# Patient Record
Sex: Female | Born: 1940 | Race: White | Hispanic: No | Marital: Married | Smoking: Former smoker
Health system: Southern US, Community
[De-identification: ages and names within clinical notes are randomized; demographics above are authoritative.]

## PROBLEM LIST (undated history)

## (undated) DIAGNOSIS — G709 Myoneural disorder, unspecified: Secondary | ICD-10-CM

## (undated) DIAGNOSIS — K279 Peptic ulcer, site unspecified, unspecified as acute or chronic, without hemorrhage or perforation: Secondary | ICD-10-CM

## (undated) DIAGNOSIS — F329 Major depressive disorder, single episode, unspecified: Secondary | ICD-10-CM

## (undated) DIAGNOSIS — Z95810 Presence of automatic (implantable) cardiac defibrillator: Secondary | ICD-10-CM

## (undated) DIAGNOSIS — J449 Chronic obstructive pulmonary disease, unspecified: Secondary | ICD-10-CM

## (undated) DIAGNOSIS — F419 Anxiety disorder, unspecified: Secondary | ICD-10-CM

## (undated) DIAGNOSIS — R011 Cardiac murmur, unspecified: Secondary | ICD-10-CM

## (undated) DIAGNOSIS — F32A Depression, unspecified: Secondary | ICD-10-CM

## (undated) DIAGNOSIS — D649 Anemia, unspecified: Secondary | ICD-10-CM

## (undated) DIAGNOSIS — K219 Gastro-esophageal reflux disease without esophagitis: Secondary | ICD-10-CM

## (undated) DIAGNOSIS — I10 Essential (primary) hypertension: Secondary | ICD-10-CM

## (undated) DIAGNOSIS — I509 Heart failure, unspecified: Secondary | ICD-10-CM

## (undated) DIAGNOSIS — K5790 Diverticulosis of intestine, part unspecified, without perforation or abscess without bleeding: Secondary | ICD-10-CM

## (undated) DIAGNOSIS — J45909 Unspecified asthma, uncomplicated: Secondary | ICD-10-CM

## (undated) DIAGNOSIS — T7840XA Allergy, unspecified, initial encounter: Secondary | ICD-10-CM

## (undated) DIAGNOSIS — M719 Bursopathy, unspecified: Secondary | ICD-10-CM

## (undated) DIAGNOSIS — Z5189 Encounter for other specified aftercare: Secondary | ICD-10-CM

## (undated) DIAGNOSIS — N189 Chronic kidney disease, unspecified: Secondary | ICD-10-CM

## (undated) DIAGNOSIS — K589 Irritable bowel syndrome without diarrhea: Secondary | ICD-10-CM

## (undated) DIAGNOSIS — M1990 Unspecified osteoarthritis, unspecified site: Secondary | ICD-10-CM

## (undated) DIAGNOSIS — H269 Unspecified cataract: Secondary | ICD-10-CM

## (undated) DIAGNOSIS — E785 Hyperlipidemia, unspecified: Secondary | ICD-10-CM

## (undated) DIAGNOSIS — Z952 Presence of prosthetic heart valve: Secondary | ICD-10-CM

## (undated) DIAGNOSIS — K649 Unspecified hemorrhoids: Secondary | ICD-10-CM

## (undated) DIAGNOSIS — M109 Gout, unspecified: Secondary | ICD-10-CM

## (undated) DIAGNOSIS — Z950 Presence of cardiac pacemaker: Secondary | ICD-10-CM

## (undated) DIAGNOSIS — M797 Fibromyalgia: Secondary | ICD-10-CM

## (undated) DIAGNOSIS — T8859XA Other complications of anesthesia, initial encounter: Secondary | ICD-10-CM

## (undated) DIAGNOSIS — K635 Polyp of colon: Secondary | ICD-10-CM

## (undated) DIAGNOSIS — I499 Cardiac arrhythmia, unspecified: Secondary | ICD-10-CM

## (undated) DIAGNOSIS — T4145XA Adverse effect of unspecified anesthetic, initial encounter: Secondary | ICD-10-CM

## (undated) DIAGNOSIS — I341 Nonrheumatic mitral (valve) prolapse: Secondary | ICD-10-CM

## (undated) DIAGNOSIS — E039 Hypothyroidism, unspecified: Secondary | ICD-10-CM

## (undated) HISTORY — DX: Presence of prosthetic heart valve: Z95.2

## (undated) HISTORY — DX: Allergy, unspecified, initial encounter: T78.40XA

## (undated) HISTORY — DX: Hyperlipidemia, unspecified: E78.5

## (undated) HISTORY — DX: Chronic kidney disease, unspecified: N18.9

## (undated) HISTORY — DX: Irritable bowel syndrome, unspecified: K58.9

## (undated) HISTORY — DX: Fibromyalgia: M79.7

## (undated) HISTORY — PX: KNEE ARTHROSCOPY: SUR90

## (undated) HISTORY — DX: Nonrheumatic mitral (valve) prolapse: I34.1

## (undated) HISTORY — DX: Heart failure, unspecified: I50.9

## (undated) HISTORY — DX: Presence of automatic (implantable) cardiac defibrillator: Z95.810

## (undated) HISTORY — PX: EYE SURGERY: SHX253

## (undated) HISTORY — PX: CARDIAC DEFIBRILLATOR PLACEMENT: SHX171

## (undated) HISTORY — DX: Chronic obstructive pulmonary disease, unspecified: J44.9

## (undated) HISTORY — DX: Unspecified hemorrhoids: K64.9

## (undated) HISTORY — DX: Polyp of colon: K63.5

## (undated) HISTORY — DX: Cardiac murmur, unspecified: R01.1

## (undated) HISTORY — DX: Unspecified asthma, uncomplicated: J45.909

## (undated) HISTORY — DX: Anemia, unspecified: D64.9

## (undated) HISTORY — DX: Peptic ulcer, site unspecified, unspecified as acute or chronic, without hemorrhage or perforation: K27.9

## (undated) HISTORY — DX: Unspecified cataract: H26.9

## (undated) HISTORY — DX: Myoneural disorder, unspecified: G70.9

## (undated) HISTORY — DX: Diverticulosis of intestine, part unspecified, without perforation or abscess without bleeding: K57.90

## (undated) HISTORY — PX: COLONOSCOPY: SHX174

## (undated) HISTORY — DX: Unspecified osteoarthritis, unspecified site: M19.90

## (undated) HISTORY — DX: Depression, unspecified: F32.A

## (undated) HISTORY — DX: Gastro-esophageal reflux disease without esophagitis: K21.9

## (undated) HISTORY — DX: Encounter for other specified aftercare: Z51.89

## (undated) HISTORY — DX: Essential (primary) hypertension: I10

## (undated) HISTORY — DX: Bursopathy, unspecified: M71.9

## (undated) HISTORY — DX: Major depressive disorder, single episode, unspecified: F32.9

## (undated) HISTORY — PX: ABDOMINAL HYSTERECTOMY: SHX81

## (undated) HISTORY — PX: INSERT / REPLACE / REMOVE PACEMAKER: SUR710

## (undated) HISTORY — DX: Anxiety disorder, unspecified: F41.9

---

## 1999-08-19 ENCOUNTER — Encounter: Admission: RE | Admit: 1999-08-19 | Discharge: 1999-08-19 | Payer: Self-pay | Admitting: Obstetrics and Gynecology

## 1999-08-19 ENCOUNTER — Encounter: Payer: Self-pay | Admitting: Obstetrics and Gynecology

## 1999-08-28 ENCOUNTER — Encounter: Admission: RE | Admit: 1999-08-28 | Discharge: 1999-08-28 | Payer: Self-pay | Admitting: Obstetrics and Gynecology

## 1999-08-28 ENCOUNTER — Encounter: Payer: Self-pay | Admitting: Obstetrics and Gynecology

## 2000-06-02 HISTORY — PX: CARDIAC VALVE REPLACEMENT: SHX585

## 2000-07-22 ENCOUNTER — Other Ambulatory Visit: Admission: RE | Admit: 2000-07-22 | Discharge: 2000-07-22 | Payer: Self-pay | Admitting: Internal Medicine

## 2000-07-22 ENCOUNTER — Encounter: Payer: Self-pay | Admitting: Internal Medicine

## 2000-07-22 ENCOUNTER — Encounter (INDEPENDENT_AMBULATORY_CARE_PROVIDER_SITE_OTHER): Payer: Self-pay | Admitting: Specialist

## 2000-08-19 ENCOUNTER — Encounter: Payer: Self-pay | Admitting: *Deleted

## 2000-08-19 ENCOUNTER — Inpatient Hospital Stay (HOSPITAL_COMMUNITY): Admission: EM | Admit: 2000-08-19 | Discharge: 2000-08-20 | Payer: Self-pay | Admitting: Emergency Medicine

## 2000-09-01 ENCOUNTER — Encounter: Payer: Self-pay | Admitting: Obstetrics and Gynecology

## 2000-09-01 ENCOUNTER — Encounter: Admission: RE | Admit: 2000-09-01 | Discharge: 2000-09-01 | Payer: Self-pay | Admitting: Obstetrics and Gynecology

## 2001-01-19 ENCOUNTER — Inpatient Hospital Stay (HOSPITAL_COMMUNITY): Admission: EM | Admit: 2001-01-19 | Discharge: 2001-01-22 | Payer: Self-pay | Admitting: Emergency Medicine

## 2001-01-19 ENCOUNTER — Encounter: Payer: Self-pay | Admitting: Pulmonary Disease

## 2001-01-19 ENCOUNTER — Ambulatory Visit (HOSPITAL_COMMUNITY): Admission: RE | Admit: 2001-01-19 | Discharge: 2001-01-19 | Payer: Self-pay | Admitting: Pulmonary Disease

## 2001-01-21 ENCOUNTER — Encounter: Payer: Self-pay | Admitting: Gastroenterology

## 2001-01-29 ENCOUNTER — Encounter: Payer: Self-pay | Admitting: Pulmonary Disease

## 2001-01-29 ENCOUNTER — Inpatient Hospital Stay (HOSPITAL_COMMUNITY): Admission: EM | Admit: 2001-01-29 | Discharge: 2001-02-04 | Payer: Self-pay | Admitting: *Deleted

## 2001-01-30 ENCOUNTER — Encounter: Payer: Self-pay | Admitting: Internal Medicine

## 2001-02-01 ENCOUNTER — Encounter: Payer: Self-pay | Admitting: Pulmonary Disease

## 2001-02-03 ENCOUNTER — Encounter: Payer: Self-pay | Admitting: Internal Medicine

## 2001-02-17 ENCOUNTER — Encounter: Payer: Self-pay | Admitting: Internal Medicine

## 2001-04-05 ENCOUNTER — Encounter: Payer: Self-pay | Admitting: Internal Medicine

## 2001-09-02 ENCOUNTER — Encounter: Admission: RE | Admit: 2001-09-02 | Discharge: 2001-09-02 | Payer: Self-pay | Admitting: Obstetrics and Gynecology

## 2001-09-02 ENCOUNTER — Encounter: Payer: Self-pay | Admitting: Obstetrics and Gynecology

## 2002-02-28 ENCOUNTER — Encounter: Payer: Self-pay | Admitting: Internal Medicine

## 2002-03-02 ENCOUNTER — Encounter: Payer: Self-pay | Admitting: Internal Medicine

## 2002-09-06 ENCOUNTER — Encounter: Admission: RE | Admit: 2002-09-06 | Discharge: 2002-09-06 | Payer: Self-pay | Admitting: Obstetrics and Gynecology

## 2002-09-06 ENCOUNTER — Encounter: Payer: Self-pay | Admitting: Obstetrics and Gynecology

## 2003-06-03 HISTORY — PX: NM MYOCAR PERF WALL MOTION: HXRAD629

## 2003-06-12 ENCOUNTER — Encounter: Payer: Self-pay | Admitting: Internal Medicine

## 2003-06-13 ENCOUNTER — Encounter: Payer: Self-pay | Admitting: Internal Medicine

## 2003-09-07 ENCOUNTER — Encounter: Admission: RE | Admit: 2003-09-07 | Discharge: 2003-09-07 | Payer: Self-pay | Admitting: Obstetrics and Gynecology

## 2003-12-20 ENCOUNTER — Ambulatory Visit (HOSPITAL_BASED_OUTPATIENT_CLINIC_OR_DEPARTMENT_OTHER): Admission: RE | Admit: 2003-12-20 | Discharge: 2003-12-20 | Payer: Self-pay | Admitting: Orthopedic Surgery

## 2003-12-20 ENCOUNTER — Ambulatory Visit (HOSPITAL_COMMUNITY): Admission: RE | Admit: 2003-12-20 | Discharge: 2003-12-20 | Payer: Self-pay | Admitting: Orthopedic Surgery

## 2004-05-13 ENCOUNTER — Ambulatory Visit: Payer: Self-pay | Admitting: Adult Health

## 2004-06-13 ENCOUNTER — Ambulatory Visit: Payer: Self-pay | Admitting: Pulmonary Disease

## 2004-07-18 ENCOUNTER — Ambulatory Visit: Payer: Self-pay | Admitting: Pulmonary Disease

## 2004-08-16 ENCOUNTER — Ambulatory Visit: Payer: Self-pay | Admitting: Internal Medicine

## 2004-09-09 ENCOUNTER — Encounter: Admission: RE | Admit: 2004-09-09 | Discharge: 2004-09-09 | Payer: Self-pay | Admitting: Obstetrics and Gynecology

## 2004-11-15 ENCOUNTER — Ambulatory Visit: Payer: Self-pay | Admitting: Pulmonary Disease

## 2004-11-26 ENCOUNTER — Ambulatory Visit: Payer: Self-pay | Admitting: Pulmonary Disease

## 2004-12-23 ENCOUNTER — Ambulatory Visit: Payer: Self-pay | Admitting: Pulmonary Disease

## 2005-01-01 ENCOUNTER — Ambulatory Visit: Payer: Self-pay | Admitting: Pulmonary Disease

## 2005-01-01 ENCOUNTER — Inpatient Hospital Stay (HOSPITAL_COMMUNITY): Admission: AD | Admit: 2005-01-01 | Discharge: 2005-01-03 | Payer: Self-pay | Admitting: Pulmonary Disease

## 2005-01-03 ENCOUNTER — Ambulatory Visit: Payer: Self-pay | Admitting: Pulmonary Disease

## 2005-01-13 ENCOUNTER — Ambulatory Visit: Payer: Self-pay | Admitting: Pulmonary Disease

## 2005-05-08 ENCOUNTER — Ambulatory Visit: Payer: Self-pay | Admitting: Pulmonary Disease

## 2005-06-13 ENCOUNTER — Ambulatory Visit: Payer: Self-pay | Admitting: Pulmonary Disease

## 2005-06-18 ENCOUNTER — Ambulatory Visit: Payer: Self-pay | Admitting: Cardiology

## 2005-07-11 ENCOUNTER — Ambulatory Visit: Payer: Self-pay | Admitting: Pulmonary Disease

## 2005-07-16 ENCOUNTER — Ambulatory Visit: Payer: Self-pay | Admitting: Internal Medicine

## 2005-07-17 ENCOUNTER — Ambulatory Visit (HOSPITAL_COMMUNITY): Admission: RE | Admit: 2005-07-17 | Discharge: 2005-07-17 | Payer: Self-pay | Admitting: Otolaryngology

## 2005-07-18 ENCOUNTER — Ambulatory Visit: Payer: Self-pay | Admitting: Pulmonary Disease

## 2005-09-11 ENCOUNTER — Encounter: Admission: RE | Admit: 2005-09-11 | Discharge: 2005-09-11 | Payer: Self-pay | Admitting: Obstetrics and Gynecology

## 2005-09-26 ENCOUNTER — Ambulatory Visit: Payer: Self-pay | Admitting: Pulmonary Disease

## 2006-01-15 ENCOUNTER — Encounter: Admission: RE | Admit: 2006-01-15 | Discharge: 2006-01-15 | Payer: Self-pay | Admitting: Cardiovascular Disease

## 2006-01-21 ENCOUNTER — Ambulatory Visit (HOSPITAL_COMMUNITY): Admission: RE | Admit: 2006-01-21 | Discharge: 2006-01-21 | Payer: Self-pay | Admitting: Cardiology

## 2006-02-25 ENCOUNTER — Inpatient Hospital Stay (HOSPITAL_COMMUNITY): Admission: AD | Admit: 2006-02-25 | Discharge: 2006-03-13 | Payer: Self-pay | Admitting: Cardiovascular Disease

## 2006-02-25 ENCOUNTER — Ambulatory Visit: Payer: Self-pay | Admitting: Internal Medicine

## 2006-02-25 HISTORY — PX: CARDIAC CATHETERIZATION: SHX172

## 2006-02-26 ENCOUNTER — Encounter (INDEPENDENT_AMBULATORY_CARE_PROVIDER_SITE_OTHER): Payer: Self-pay | Admitting: *Deleted

## 2006-02-26 HISTORY — PX: MITRAL VALVE REPLACEMENT: SHX147

## 2006-03-25 ENCOUNTER — Ambulatory Visit: Payer: Self-pay | Admitting: Pulmonary Disease

## 2006-04-01 ENCOUNTER — Encounter: Admission: RE | Admit: 2006-04-01 | Discharge: 2006-04-01 | Payer: Self-pay | Admitting: Cardiovascular Disease

## 2006-04-02 ENCOUNTER — Ambulatory Visit: Payer: Self-pay | Admitting: Internal Medicine

## 2006-04-02 ENCOUNTER — Inpatient Hospital Stay (HOSPITAL_COMMUNITY): Admission: AD | Admit: 2006-04-02 | Discharge: 2006-04-04 | Payer: Self-pay | Admitting: Cardiovascular Disease

## 2006-04-03 HISTORY — PX: RIGHT HEART CATH: SHX6075

## 2006-04-08 ENCOUNTER — Ambulatory Visit: Payer: Self-pay | Admitting: Internal Medicine

## 2006-04-29 ENCOUNTER — Ambulatory Visit: Payer: Self-pay | Admitting: Internal Medicine

## 2006-05-19 ENCOUNTER — Encounter: Admission: RE | Admit: 2006-05-19 | Discharge: 2006-05-19 | Payer: Self-pay | Admitting: Thoracic Surgery

## 2006-06-04 ENCOUNTER — Ambulatory Visit: Payer: Self-pay | Admitting: Pulmonary Disease

## 2006-07-09 ENCOUNTER — Ambulatory Visit: Payer: Self-pay | Admitting: Internal Medicine

## 2006-07-23 ENCOUNTER — Inpatient Hospital Stay (HOSPITAL_COMMUNITY): Admission: RE | Admit: 2006-07-23 | Discharge: 2006-07-25 | Payer: Self-pay | Admitting: Internal Medicine

## 2006-07-23 ENCOUNTER — Ambulatory Visit: Payer: Self-pay | Admitting: Internal Medicine

## 2006-08-05 ENCOUNTER — Ambulatory Visit: Payer: Self-pay

## 2006-08-17 ENCOUNTER — Ambulatory Visit: Payer: Self-pay

## 2006-09-15 ENCOUNTER — Ambulatory Visit: Payer: Self-pay | Admitting: Internal Medicine

## 2006-09-28 ENCOUNTER — Ambulatory Visit: Payer: Self-pay | Admitting: Internal Medicine

## 2006-10-08 ENCOUNTER — Encounter: Admission: RE | Admit: 2006-10-08 | Discharge: 2006-10-08 | Payer: Self-pay | Admitting: Obstetrics and Gynecology

## 2006-12-08 ENCOUNTER — Inpatient Hospital Stay (HOSPITAL_COMMUNITY): Admission: AD | Admit: 2006-12-08 | Discharge: 2006-12-12 | Payer: Self-pay | Admitting: Cardiovascular Disease

## 2007-03-18 ENCOUNTER — Encounter: Admission: RE | Admit: 2007-03-18 | Discharge: 2007-04-20 | Payer: Self-pay | Admitting: Neurology

## 2007-07-03 IMAGING — CR DG CHEST 2V
2 series · 2 of 2 positions shown · non-contrast
Comparison: 03/06/06.

CLINICAL DATA: Chest pain and shortness of breath.   Confusion. 
 CHEST - 2 VIEW:

[view not recorded (1 of 2)]
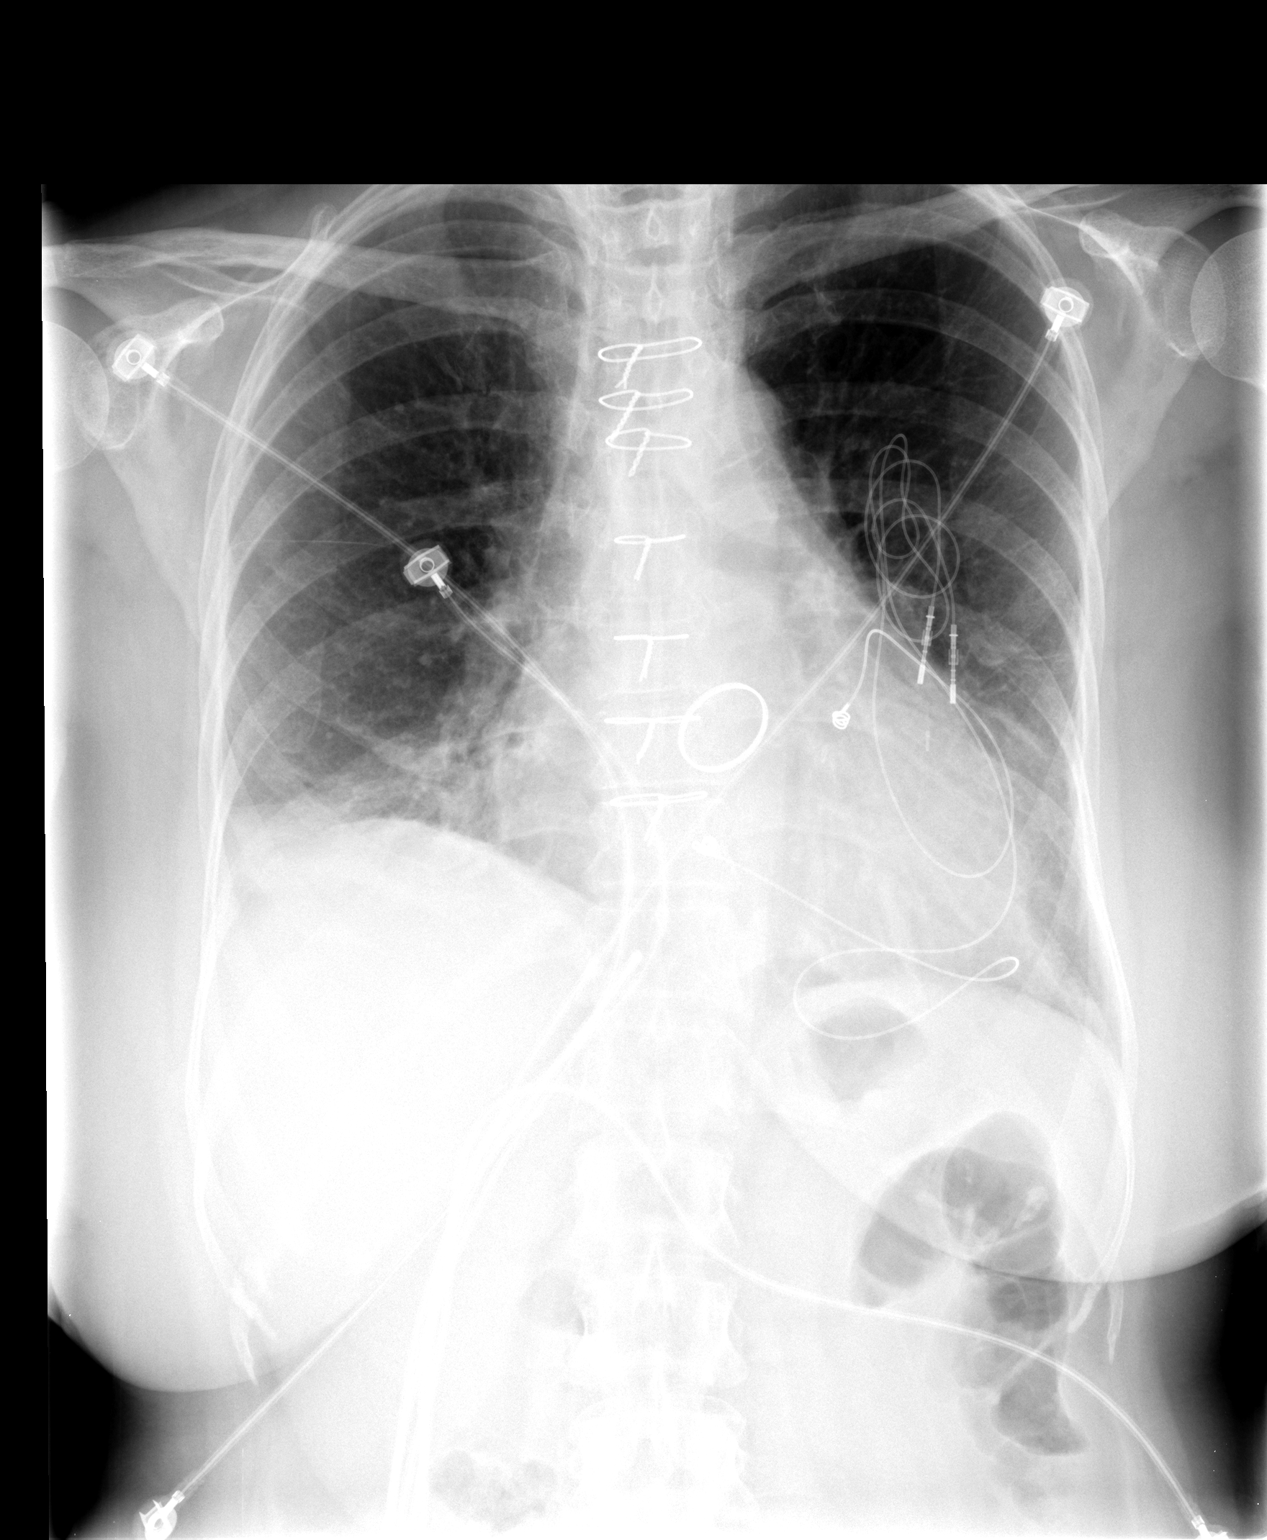

[view not recorded (2 of 2)]
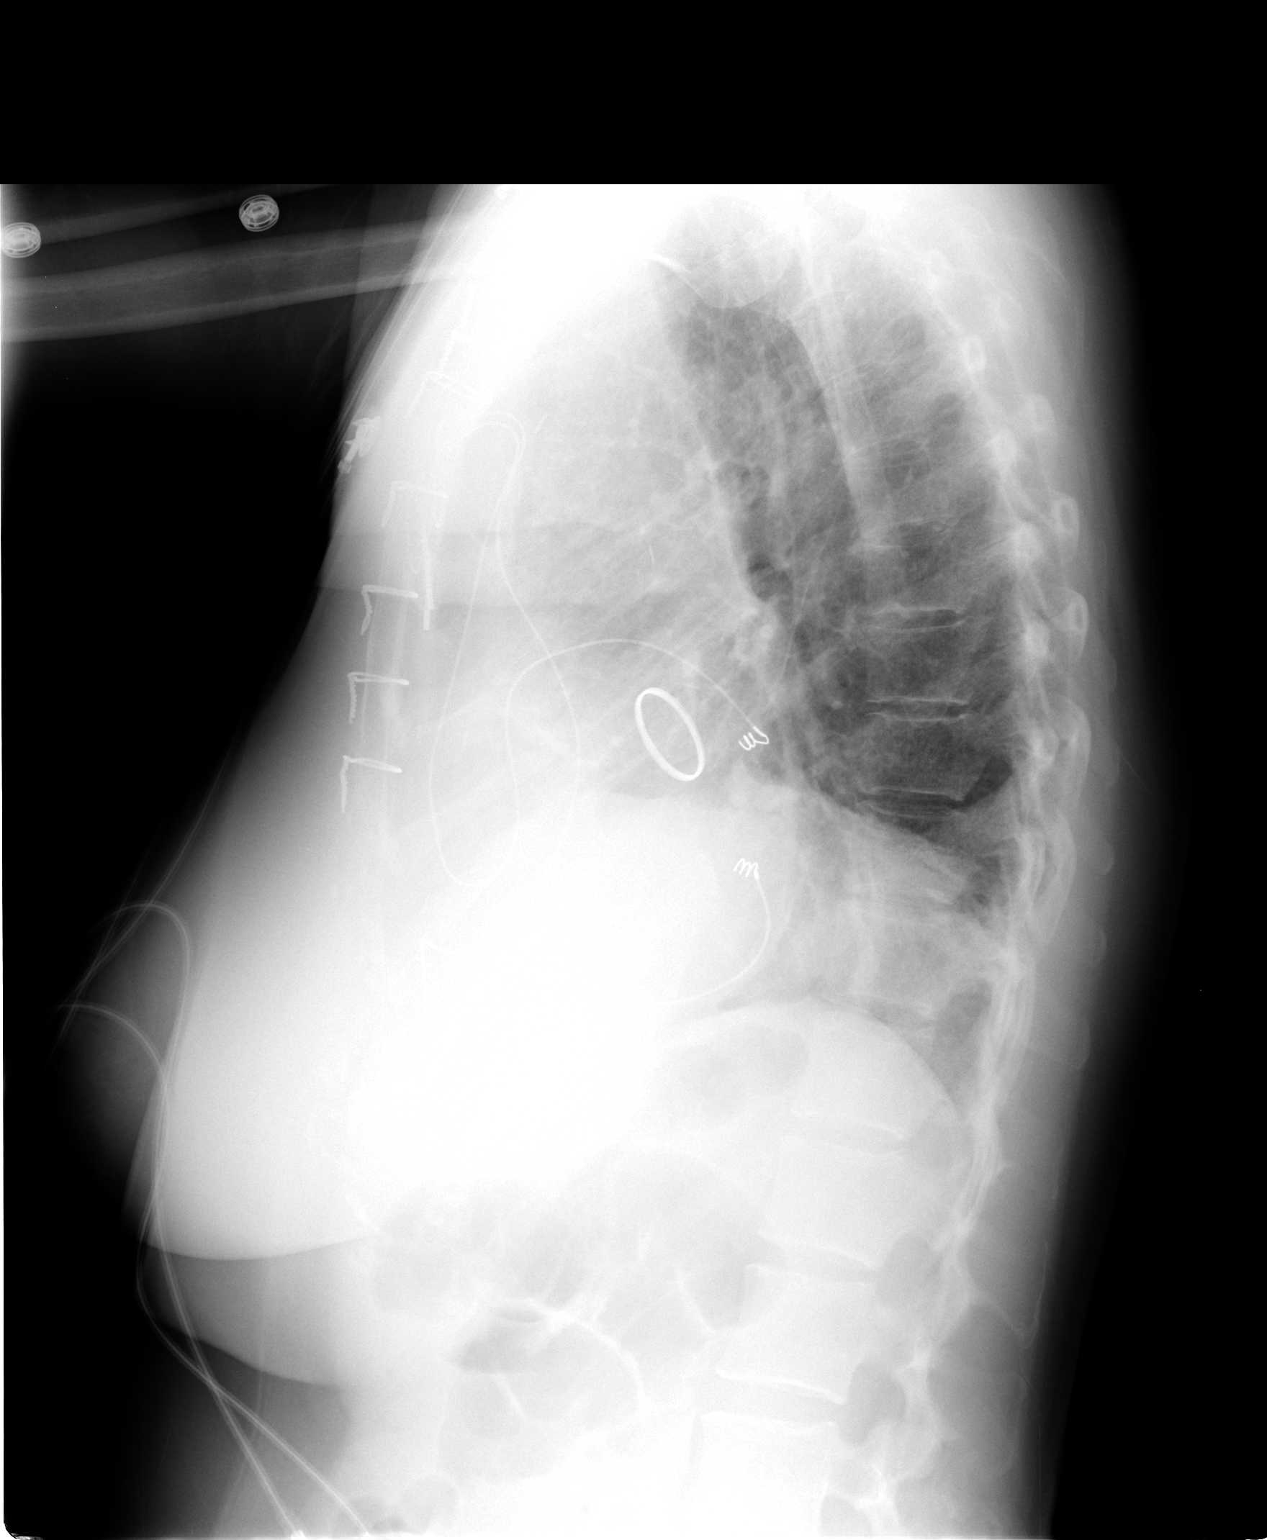

[2 of 2 positions shown; findings below may reference images not displayed]

FINDINGS: There is a mitral valve replacement.  Epicardial pacing leads are in place. The right pleural effusion and right base atelectasis have significantly improved since the prior study.  There is no significant residual effusion.  Atelectasis at the left base is almost completely resolved.  Vascularity is normal.  There is mild cardiomegaly.
IMPRESSION: Improving bibasilar atelectasis.  Resolution of the right effusion.

## 2007-08-18 ENCOUNTER — Ambulatory Visit: Payer: Self-pay | Admitting: Internal Medicine

## 2007-08-19 ENCOUNTER — Ambulatory Visit: Payer: Self-pay | Admitting: Internal Medicine

## 2007-09-20 ENCOUNTER — Ambulatory Visit: Payer: Self-pay | Admitting: Internal Medicine

## 2007-09-20 LAB — CONVERTED CEMR LAB
Basophils Relative: 0.1 % (ref 0.0–1.0)
Eosinophils Absolute: 0.3 10*3/uL (ref 0.0–0.7)
Eosinophils Relative: 2.1 % (ref 0.0–5.0)
Lymphocytes Relative: 16.3 % (ref 12.0–46.0)
MCV: 90 fL (ref 78.0–100.0)
Monocytes Relative: 7.8 % (ref 3.0–12.0)
Neutrophils Relative %: 73.7 % (ref 43.0–77.0)
Platelets: 342 10*3/uL (ref 150–400)
RBC: 3.91 M/uL (ref 3.87–5.11)
WBC: 12.3 10*3/uL — ABNORMAL HIGH (ref 4.5–10.5)

## 2008-07-31 DIAGNOSIS — K5730 Diverticulosis of large intestine without perforation or abscess without bleeding: Secondary | ICD-10-CM | POA: Insufficient documentation

## 2008-07-31 DIAGNOSIS — D539 Nutritional anemia, unspecified: Secondary | ICD-10-CM | POA: Insufficient documentation

## 2008-07-31 DIAGNOSIS — M7150 Other bursitis, not elsewhere classified, unspecified site: Secondary | ICD-10-CM

## 2008-07-31 DIAGNOSIS — K648 Other hemorrhoids: Secondary | ICD-10-CM | POA: Insufficient documentation

## 2008-07-31 DIAGNOSIS — K219 Gastro-esophageal reflux disease without esophagitis: Secondary | ICD-10-CM

## 2008-07-31 DIAGNOSIS — E785 Hyperlipidemia, unspecified: Secondary | ICD-10-CM | POA: Insufficient documentation

## 2008-08-01 ENCOUNTER — Ambulatory Visit: Payer: Self-pay | Admitting: Internal Medicine

## 2008-08-01 DIAGNOSIS — Z860100 Personal history of colon polyps, unspecified: Secondary | ICD-10-CM | POA: Insufficient documentation

## 2008-08-15 ENCOUNTER — Encounter: Payer: Self-pay | Admitting: Internal Medicine

## 2008-08-15 ENCOUNTER — Ambulatory Visit: Payer: Self-pay | Admitting: Internal Medicine

## 2008-08-18 ENCOUNTER — Encounter: Payer: Self-pay | Admitting: Internal Medicine

## 2009-08-01 ENCOUNTER — Ambulatory Visit: Payer: Self-pay | Admitting: Internal Medicine

## 2009-08-01 DIAGNOSIS — R112 Nausea with vomiting, unspecified: Secondary | ICD-10-CM

## 2009-08-01 DIAGNOSIS — Z87448 Personal history of other diseases of urinary system: Secondary | ICD-10-CM | POA: Insufficient documentation

## 2009-08-01 DIAGNOSIS — K625 Hemorrhage of anus and rectum: Secondary | ICD-10-CM

## 2009-08-01 HISTORY — DX: Nausea with vomiting, unspecified: R11.2

## 2009-08-07 ENCOUNTER — Encounter: Admission: RE | Admit: 2009-08-07 | Discharge: 2009-08-07 | Payer: Self-pay | Admitting: Obstetrics and Gynecology

## 2009-11-06 ENCOUNTER — Ambulatory Visit (HOSPITAL_COMMUNITY): Admission: RE | Admit: 2009-11-06 | Discharge: 2009-11-06 | Payer: Self-pay | Admitting: Internal Medicine

## 2009-12-25 ENCOUNTER — Encounter: Payer: Self-pay | Admitting: Internal Medicine

## 2010-01-24 ENCOUNTER — Ambulatory Visit: Payer: Self-pay | Admitting: Internal Medicine

## 2010-01-24 DIAGNOSIS — R079 Chest pain, unspecified: Secondary | ICD-10-CM | POA: Insufficient documentation

## 2010-01-24 DIAGNOSIS — Z95810 Presence of automatic (implantable) cardiac defibrillator: Secondary | ICD-10-CM

## 2010-04-03 LAB — IFOBT (OCCULT BLOOD): IFOBT: NEGATIVE

## 2010-06-23 ENCOUNTER — Encounter: Payer: Self-pay | Admitting: Surgery

## 2010-07-04 NOTE — Procedures (Signed)
Summary: EGD   EGD  Procedure date:  03/02/2002  Findings:      Location: [place]  GERD  EGD  Procedure date:  03/02/2002  Findings:      Location: [place]  GERD Patient Name: [name], [name]. MRN: [id number] Procedure Procedures: Panendoscopy (EGD) CPT: 88891.  Personnel: Endoscopist: Docia Chuck. Henrene Pastor, MD.  Exam Location: Exam performed in Outpatient Clinic. Outpatient  Patient Consent: Procedure, Alternatives, Risks and Benefits discussed, consent obtained, from patient. Consent was obtained by the RN.  Indications Symptoms: Nausea. Abdominal pain, location: epigastric.  Surveillance of: Duodenal ulcer.  History  Pre-Exam Physical: Performed Mar 02, 2002  Entire physical exam was normal.  Exam Exam Info: Maximum depth of insertion Duodenum, intended Duodenum. Patient position: on left side. Vocal cords visualized. Gastric retroflexion performed. Images taken. ASA Classification: II. Tolerance: excellent.  Sedation Meds: Patient assessed and found to be appropriate for moderate (conscious) sedation. Fentanyl 100 mcg. given IV. Versed 10 mg. given IV. Cetacaine Spray given aerosolized.  Monitoring: BP and pulse monitoring done. Oximetry used. Supplemental O2 given  Findings Normal: Proximal Esophagus to Duodenal 2nd Portion.   Assessment Normal examination.  Diagnoses: 530.81: GERD.   Events  Unplanned Intervention: No unplanned interventions were required.  Unplanned Events: There were no complications. Plans Medication(s): Continue current medications.  Disposition: After procedure patient sent to recovery. After recovery patient sent home.  Scheduling: Follow-up prn.   This report was created from the original endoscopy report, which was reviewed and signed by the above listed endoscopist.   cc:  Teressa Lower, MD

## 2010-07-04 NOTE — Procedures (Signed)
Summary: EGD   EGD  Procedure date:  01/21/2001  Findings:      Location: [facility]  Ulcer-Duodenal Hiatal Hernia Patient Name: [name], [name]. MRN: [id number] Procedure Procedures: Panendoscopy (EGD) CPT: 92426.  Personnel: Endoscopist: Docia Chuck. Henrene Pastor, MD.  Exam Location: Exam performed in Endoscopy Suite.  Patient Consent: Procedure, Alternatives, Risks and Benefits discussed, consent obtained,  Indications Symptoms: Nausea. Vomiting. Abdominal pain, Melena.  Surveillance of: Duodenal ulcer.  History  Pre-Exam Physical: Performed Jan 30, 2001  Cardio-pulmonary exam, HEENT exam, Abdominal exam, Extremity exam, Neurological exam, Mental status exam WNL.  Exam Exam Info: Maximum depth of insertion Duodenum, intended Duodenum. Exam incomplete due to stenosis at D1/D2 junction. Patient position: on left side. Vocal cords visualized. Gastric retroflexion performed. Images taken. ASA Classification: II. Tolerance: excellent.  Sedation Meds: Demerol 50 mg. Versed 6 mg.  Monitoring: BP and pulse monitoring done. Oximetry used. Supplemental O2 given  Fluoroscopy: Fluoroscopy was not used.  Findings ULCER: in Duodenal Bulb Maximum size: 7 mm. Cleam based w/ friable edges. An image was taken. ICD9: Ulcer, Duodenal, Acute with Hemorrhage: 534.00.  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  Constriction: almost complete. Etiology: duodenal stenosis. Lumen diameter is 5 mm. Comment: edematous tissue around stenosis.   Assessment Abnormal examination, see findings above.  Diagnoses: 534.00: Ulcer, Duodenal, Acute with Hemorrhage.  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   Events  Unplanned Intervention: No unplanned interventions were required.  Unplanned Events: There were no complications. Plans Comments: 1. Clears 2. Cont PPI Rx  3. No ASA/NSAIDS 4. Monitor H/H 5. DN100 prn pain Disposition: After procedure patient sent to recovery. After recovery  patient sent back to hospital.   This report was created from the original endoscopy report, which was reviewed and signed by the above listed endoscopist.   cc:  Teressa Lower, MD

## 2010-07-04 NOTE — Letter (Signed)
Summary: [place]   Imported By: Phillis Knack 08/01/2009 13:36:34  _____________________________________________________________________  External Attachment:    Type:   Image     Comment:   External Document

## 2010-07-04 NOTE — Progress Notes (Signed)
Summary: [name]   Imported By: Phillis Knack 08/01/2009 13:41:52  _____________________________________________________________________  External Attachment:    Type:   Image     Comment:   External Document

## 2010-07-04 NOTE — Assessment & Plan Note (Signed)
Summary: FOLLOWUP GERD / NV / minor bleeding    History of Present Illness Visit Type: Follow-up Visit Primary GI MD: Scarlette Shorts MD Primary Provider: Leanna Battles, MD Requesting Provider: n/a Chief Complaint: Occasional Stomach cramps, vomiting, and rectal bleeding  History of Present Illness:   70 year old female with multiple medical problems including hyperlipidemia, asthma, hypertension, mitral valve replacement, chronic systemic anticoagulation in the form of Coumadin, GERD, history of peptic ulcer disease, and adenomatous colon polyps. She presents today for followup regarding her GERD. She requests a medication refill. She also reports a new complaint of vomiting as well as a complaint of intermittent rectal bleeding. She was last evaluated in March 2010. She underwent complete colonoscopy at that time and was found to have a diminutive polyp which was removed as well as internal hemorrhoids. The polyp was adenomatous. Followup in 5 years recommended. Her last upper endoscopy, and 2009, was normal. For her GERD she takes Nexium in the morning. For the most part, symptoms controlled. She has noticed over the past 3 weeks intermittent problems with vomiting. This has occurred about 3-4 mornings of each week. It is not related to meals or taking medication. Generally bile vomitus. After vomiting, she feels better. No lingering nausea throughout the day. Appetite is fine. Next, she reports some minor intermittent rectal bleeding. Blood limited to the tissue. No lower abdominal pain or change in bowel habits. Review of outside laboratories from July 30, 2009 revealed a normal hemoglobin of 12.3. Normal comprehensive metabolic panel. Abnormal lipids. She was evaluated by her gynecologist February 22 (outside office note reviewed). At that time, Hemoccult testing was negative.   GI Review of Systems    Reports abdominal pain, acid reflux, nausea, and  vomiting.     Location of  Abdominal pain:  occasional cramps.    Denies belching, bloating, chest pain, dysphagia with liquids, dysphagia with solids, heartburn, loss of appetite, vomiting blood, weight loss, and  weight gain.      Reports rectal bleeding.     Denies anal fissure, black tarry stools, change in bowel habit, constipation, diarrhea, diverticulosis, fecal incontinence, heme positive stool, hemorrhoids, irritable bowel syndrome, jaundice, light color stool, liver problems, and  rectal pain.    Current Medications (verified): 1)  Proventil Hfa 108 (90 Base) Mcg/act Aers (Albuterol Sulfate) .... As Needed 2)  Pravastatin Sodium 40 Mg Tabs (Pravastatin Sodium) .... Once Daily 3)  Furosemide 40 Mg Tabs (Furosemide) .... 3  Tablets By Mouth Once Daily 4)  Spironolactone 25 Mg Tabs (Spironolactone) .[name].. 1 Tablet By Mouth Once Daily 5)  Coumadin 5 Mg Tabs (Warfarin Sodium) .... Take As Directed 6)  Advair Diskus 250-50 Mcg/dose Misc (Fluticasone-Salmeterol) .... 2 Puffs Once Daily 7)  Nexium 40 Mg Cpdr (Esomeprazole Magnesium) .[name].. 1 Capsule By Mouth Once Daily 8)  Pacerone 100 Mg Tabs (Amiodarone Hcl) .... Once Daily 9)  Fenofibrate Micronized 134 Mg Caps (Fenofibrate Micronized) .... One Tablet By Mouth Once Daily 10)  Metoprolol Tartrate 25 Mg Tabs (Metoprolol Tartrate) .... Once Daily 11)  Prilosec Otc 20 Mg Tbec (Omeprazole Magnesium) .... As Needed 12)  Calcium-Vitamin D 500-125 Mg-Unit Tabs (Calcium-Vitamin D) .... Once Daily 13)  Coq-10 150 Mg Caps (Coenzyme Q10) .... Once Daily 14)  Zetia 10 Mg Tabs (Ezetimibe) .... Once Daily 15)  Diovan 160 Mg Tabs (Valsartan) .... One Tablet By Mouth Once Daily 16)  Cvs Stool Softener 100 Mg Caps (Docusate Sodium) .... Three Tablets By Mouth Once Daily After Dinner  Allergies (  verified): 1)  ! Ace Inhibitors  Past History:  Past Medical History: Reviewed history from 07/31/2008 and no changes required. Current Problems:  HYPERLIPIDEMIA (ICD-272.4) BURSITIS  (ICD-727.3) INTERNAL HEMORRHOIDS (ICD-455.0) DIVERTICULOSIS, COLON (ICD-562.10) ANEMIA, CHRONIC (ICD-281.9) GERD (ICD-530.81) MVP  Past Surgical History: Reviewed history from 07/31/2008 and no changes required. hysterectomy Mitral valve replacement  Family History: Family History of Breast Cancer:Sisters Family History of Uterine Cancer:Sisters Family History of Heart Disease: Mother & Father No FH of Colon Cancer:  Social History: Reviewed history from 08/01/2008 and no changes required. Occupation: Retired Patient is a former smoker.  Alcohol Use - no Illicit Drug Use - no Patient gets regular exercise.  Review of Systems       The patient complains of allergy/sinus, arthritis/joint pain, back pain, blood in urine, cough, thirst - excessive, and urination changes/pain.  The patient denies anemia, anxiety-new, breast changes/lumps, change in vision, confusion, coughing up blood, depression-new, fainting, fatigue, fever, headaches-new, hearing problems, heart murmur, heart rhythm changes, itching, menstrual pain, muscle pains/cramps, night sweats, nosebleeds, pregnancy symptoms, shortness of breath, skin rash, sleeping problems, sore throat, swelling of feet/legs, swollen lymph glands, thirst - excessive , urination - excessive , urine leakage, vision changes, and voice change.    Vital Signs:  Patient profile:   70 year old female Height:      62 inches Weight:      135 pounds BMI:     24.78 BSA:     1.62 Pulse rate:   68 / minute Pulse rhythm:   regular BP sitting:   100 / 60  (left arm) Cuff size:   regular  Vitals Entered By: Hope Pigeon CMA (August 01, 2009 8:19 AM)  Physical Exam  General:  Well developed, well nourished, no acute distress. Head:  Normocephalic and atraumatic. Eyes:  PERRLA, no icterus. Ears:  Normal auditory acuity. Nose:  No deformity, discharge,  or lesions. Mouth:  No deformity or lesions, dentition normal. Lungs:  Clear throughout to  auscultation. Heart:  mechanical heart sounds Abdomen:  Soft, nontender and nondistended. No masses, hepatosplenomegaly or hernias noted. Normal bowel sounds. Rectal:  negative February 22. Not repeated Msk:  Symmetrical with no gross deformities. Normal posture. Pulses:  Normal pulses noted. Extremities:  No clubbing, cyanosis, edema or deformities noted. Neurologic:  Alert and  oriented x4;  grossly normal neurologically. Skin:  Intact without significant lesions or rashes. Psych:  Alert and cooperative. Normal mood and affect.   Impression & Recommendations:  Problem # 1:  RECTAL BLEEDING (ICD-569.3) Minor intermittent rectal bleeding as described. Colonoscopy last year as described. Bleeding almost certainly due to known hemorrhoids. No evidence of anemia.  Plan: #1. Hemorrhoid care kit  Problem # 2:  NAUSEA AND VOMITING (ICD-787.01) 3 week history of intermittent nausea with vomiting in the morning. May represent mild motility disturbance or breakthrough GERD  Plan: #1. Continue Nexium in the morning #2. Add Prilosec at night for several weeks #3. Consider antiemetics if problem worsens. She does not feel that she needs these at present. I agree.  #4. Contact the office if the problem persists or worsens  Problem # 3:  GERD (ICD-530.81) ongoing. Need PPI for control of symptoms.  Plan: #1. Continue Nexium 40 mg daily. New prescription with multiple refills submitted. #2. Reflux precautions #3. Routine followup in one year  Problem # 4:  PERSONAL HX COLONIC POLYPS (ICD-V12.72) adenomatous colon polyp on last colonoscopy. Routine surveillance planned for 2015  Patient Instructions: 1)  Nexium  40 mg 1 by mouth once daily #30 x 11 RFs.  2)  The medication list was reviewed and reconciled.  All changed / newly prescribed medications were explained.  A complete medication list was provided to the patient / caregiver. 3)  Copy: Dr. Leanna Battles, Dr. Olena Mater, Dr. Denver Faster Prescriptions: NEXIUM 40 MG CPDR (ESOMEPRAZOLE MAGNESIUM) 1 capsule by mouth once daily  #30 x 11   Entered by:   Randye Lobo NCMA   Authorized by:   Irene Shipper MD   Signed by:   Randye Lobo NCMA on 08/01/2009   Method used:   Electronically to        [facility] [id number] (retail)       [address]       [place], [place]  [address]       Ph: [phone]       Fax: [phone]   RxID:   [id number]

## 2010-07-04 NOTE — Assessment & Plan Note (Signed)
Summary: device site check/per kelly/mt      Allergies Added:   Referring Reigna Ruperto:  n/a Primary Ileane Sando:  Leanna Battles, MD  CC:  device site check. Pt states for the last 3 months she has been having soreness around the site.  It has gotten worse and says that it feels as if the device has dropped.  It is very sore around it and "the wires look like they are protruding"  Pt is becoming a little aggrivated due to not getting any answers..  History of Present Illness: Mrs. Winterton is seen at the request of Dr. Alvester Chou because of pain around her defibrillator site.  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 Her procedure was complicated by hematoma. SHe has been been followed by [facility]; we have not seen her since.  Her complaint is that she has pain around her defibrillator site. There is evidence of atrophy and thinning cephalad to her device. There has been some warmth and tenderness,  she and her husband are also requesting pain medication  Current Medications (verified): 1)  Proventil Hfa 108 (90 Base) Mcg/act Aers (Albuterol Sulfate) .... As Needed 2)  Pravastatin Sodium 40 Mg Tabs (Pravastatin Sodium) .... Once Daily 3)  Furosemide 40 Mg Tabs (Furosemide) .... 3  Tablets By Mouth Once Daily 4)  Spironolactone 25 Mg Tabs (Spironolactone) .[name].. 1 Tablet By Mouth Once Daily 5)  Coumadin 5 Mg Tabs (Warfarin Sodium) .... Take As Directed 6)  Advair Diskus 250-50 Mcg/dose Misc (Fluticasone-Salmeterol) .... 2 Puffs Once Daily 7)  Nexium 40 Mg Cpdr (Esomeprazole Magnesium) .[name].. 1 Capsule By Mouth Once Daily 8)  Pacerone 100 Mg Tabs (Amiodarone Hcl) .... Once Daily 9)  Fenofibrate Micronized 134 Mg Caps (Fenofibrate Micronized) .... One Tablet By Mouth Once Daily 10)  Metoprolol Tartrate 25 Mg Tabs (Metoprolol Tartrate) .... Once Daily 11)  Prilosec Otc 20 Mg Tbec (Omeprazole Magnesium) .... As  Needed 12)  Calcium-Vitamin D 500-125 Mg-Unit Tabs (Calcium-Vitamin D) .... Once Daily 13)  Coq-10 150 Mg Caps (Coenzyme Q10) .... Once Daily 14)  Zetia 10 Mg Tabs (Ezetimibe) .... Once Daily 15)  Diovan 160 Mg Tabs (Valsartan) .... One Tablet By Mouth Once Daily 16)  Cvs Stool Softener 100 Mg Caps (Docusate Sodium) .... Three Tablets By Mouth Once Daily After Dinner  Allergies (verified): 1)  ! Ace Inhibitors  Past History:  Family History: Last updated: 08/01/2009 Family History of Breast Cancer:Sisters Family History of Uterine Cancer:Sisters Family History of Heart Disease: Mother & Father No FH of Colon Cancer:  Social History: Last updated: 08/01/2008 Occupation: Retired Patient is a former smoker.  Alcohol Use - no Illicit Drug Use - no Patient gets regular exercise.  Past Medical History: Current Problems:  HYPERLIPIDEMIA (ICD-272.4) BURSITIS (ICD-727.3) INTERNAL HEMORRHOIDS (ICD-455.0) DIVERTICULOSIS, COLON (ICD-562.10) ANEMIA, CHRONIC (ICD-281.9) GERD (ICD-530.81) MVP  Past Surgical History: hysterectomy Mitral valve replacement-last surgery September 2007  (Ceramic valve)  Vital Signs:  Patient profile:   70 year old female Height:      62 inches Weight:      135 pounds BMI:     24.78 Pulse rate:   76 / minute Pulse rhythm:   regular BP sitting:   112 / 60  (left arm) Cuff size:   regular  Vitals Entered By: Doug Sou CMA (January 24, 2010 11:25 AM)  Physical Exam  General:  Well developed, well nourished, no acute distress. Head:  nl heent Neck:  supple Chest Wall:  atrophy over the device with good tissue motion over the leads.  mild calor with tenderness no eryhtma Lungs:  clear Heart:  mechanical S1 RRR  reg s2 with no murmer Abdomen:  soft bs+ Msk:  Back normal, normal gait. Muscle strength and tone normal. Extremities:  no clubbing cynaosis edema Neurologic:  grossly normal Skin:  wam and dry as noted Cervical Nodes:  neg  LNB Psych:  enganging    ICD Specifications ICD Vendor:  [employer]     ICD Model Number:  [id number]     ICD Serial Number:  [id number] ICD DOI:  07/23/2006     ICD Implanting MD:  Virl Axe, MD  Lead 1:    Location: RA     DOI: 07/23/2006     Model #: [id number]     Serial #: [id number]     Status: active Lead 2:    Location: RV     DOI: 07/23/2006     Model #: [id number]     Serial #: [id number]     Status: active Lead 3:    Location: LV     DOI: 07/23/2006     Model #: [id number]     Serial #: [id number]     Status: active  ICD Follow Up Battery Voltage:  2.89 V     Charge Time:  11.2 seconds     Underlying rhythm:  SR @ 55  Brady Parameters Mode DDDR     Lower Rate Limit:  60     Upper Rate Limit 130 PAV 140     Sensed AV Delay:  120  Tachy Zones VF:  200     VT:  200-250     VT1:  176-200     Tech Comments:  PT FOLLOWED BY DR SOLOMAN--CHANGED RV SENSITIVITY FROM 0.3 TO 0.43m.  NORMAL DEVICE FUNCTION. KShelly Bombard Impression & Recommendations:  Problem # 1:  CHEST PAIN (ICD-786.50) the patient has pain in her discharge later site. This is accompanied by some subcutaneous atrophy. There is some . I'm concerned about the possibility of low-grade infection. I don't think there is enough yet to declare this did prompt exploration. We'll keep an eye on it. Her updated medication list for this problem includes:    Coumadin 5 Mg Tabs (Warfarin sodium) ..[name].. Take as directed    Metoprolol Tartrate 25 Mg Tabs (Metoprolol tartrate) ..... Once daily  Problem # 3:  IMPLANTATION OF DEFIBRILLATOR, HX OF (ICD-V45.02) Device parameters and data were reviewed and device was reprogrammed with ventricular sensitivity to eliminate T wave over sensing  Problem # 4:  MITRAL VALVE DISORDER STATUS POST REPLACEMENT/ MECHANICAL (ICD-424.0) the presence of mechanical mitral valve had some urgency to the issue of device infection. If there is infection it is pocket related. In addition she has more symptoms to suggest a  pocket infection prompt a VAC pump removal of the system might help prevent bacteremia. Her updated medication list for this problem includes:    Furosemide 40 Mg Tabs (Furosemide) ..Marland KitchenMarland KitchenMarland KitchenMarland Kitchen3  tablets by mouth once daily    Spironolactone 25 Mg Tabs (Spironolactone) ..[name].. 1 tablet by mouth once daily    Metoprolol Tartrate 25 Mg Tabs (Metoprolol tartrate) ..... Once daily    Diovan 160 Mg Tabs (Valsartan) ..... One tablet by mouth once daily  Patient Instructions: 1)  Your physician recommends that you schedule a follow-up appointment in: PENDING WILL DO CHANGE OUT WHEN NEEDED  2)  Your physician recommends that you continue on your current medications as directed. Please refer to the Current Medication list given to you today.

## 2010-07-04 NOTE — Progress Notes (Signed)
Summary: [place]  [place]   Imported By: Phillis Knack 08/01/2009 13:32:16  _____________________________________________________________________  External Attachment:    Type:   Image     Comment:   External Document

## 2010-07-04 NOTE — Progress Notes (Signed)
Summary: [place]  [place]   Imported By: Phillis Knack 08/01/2009 13:30:43  _____________________________________________________________________  External Attachment:    Type:   Image     Comment:   External Document

## 2010-07-04 NOTE — Procedures (Signed)
Summary: EGD   EGD  Procedure date:  01/30/2001  Findings:      Location: [facility]  Findings: Ulcer duodenal Gastric Outlet Obstruction Patient Name: [name], [name]. MRN: [id number] Procedure Procedures: Panendoscopy (EGD) CPT: 37096.  Personnel: Endoscopist: Docia Chuck. Henrene Pastor, MD.  Exam Location: Exam performed in Endoscopy Suite.  Patient Consent: Procedure, Alternatives, Risks and Benefits discussed, consent obtained,  Indications Symptoms: Nausea. Vomiting. Abdominal pain, Melena.  Surveillance of: Duodenal ulcer.  History  Pre-Exam Physical: Performed Jan 30, 2001  Cardio-pulmonary exam, HEENT exam, Abdominal exam, Extremity exam, Neurological exam, Mental status exam WNL.  Exam Exam Info: Maximum depth of insertion Duodenum, intended Duodenum. Exam incomplete due to stenosis at D1/D2 junction. Patient position: on left side. Vocal cords visualized. Gastric retroflexion performed. Images taken. ASA Classification: II. Tolerance: excellent.  Sedation Meds: Demerol 50 mg. Versed 6 mg.  Monitoring: BP and pulse monitoring done. Oximetry used. Supplemental O2 given  Fluoroscopy: Fluoroscopy was not used.  Findings ULCER: in Duodenal Bulb Maximum size: 7 mm. Cleam based w/ friable edges. An image was taken. ICD9: Ulcer, Duodenal, Acute with Hemorrhage: 534.00.  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  Constriction: almost complete. Etiology: duodenal stenosis. Lumen diameter is 5 mm. Comment: edematous tissue around stenosis.   Assessment Abnormal examination, see findings above.  Diagnoses: 534.00: Ulcer, Duodenal, Acute with Hemorrhage.  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   Events  Unplanned Intervention: No unplanned interventions were required.  Unplanned Events: There were no complications. Plans Comments: 1. Clears 2. Cont PPI Rx  3. No ASA/NSAIDS 4. Monitor H/H 5. DN100 prn pain Disposition: After procedure patient sent to  recovery. After recovery patient sent back to hospital.   This report was created from the original endoscopy report, which was reviewed and signed by the above listed endoscopist.   cc:  Excell Seltzer, MD      Teressa Lower, MD

## 2010-07-04 NOTE — Letter (Signed)
Summary: [place]   Imported By: Marilynne Drivers 01/02/2010 17:47:47  _____________________________________________________________________  External Attachment:    Type:   Image     Comment:   External Document  Appended Document: [facility]     Past History:  Past Medical History: Last updated: 07/31/2008 Current Problems:  HYPERLIPIDEMIA (ICD-272.4) BURSITIS (ICD-727.3) INTERNAL HEMORRHOIDS (ICD-455.0) DIVERTICULOSIS, COLON (ICD-562.10) ANEMIA, CHRONIC (ICD-281.9) GERD (ICD-530.81) MVP  Past Surgical History: Last updated: 07/31/2008 hysterectomy Mitral valve replacement  Family History: Last updated: 08/01/2009 Family History of Breast Cancer:Sisters Family History of Uterine Cancer:Sisters Family History of Heart Disease: Mother & Father No FH of Colon Cancer:  Social History: Last updated: 08/01/2008 Occupation: Retired Patient is a former smoker.  Alcohol Use - no Illicit Drug Use - no Patient gets regular exercise.  Risk Factors: Exercise: yes (08/01/2008)  Risk Factors: Smoking Status: quit (08/01/2008)     Problems Prior to Update: 1)  Rectal Bleeding  (ICD-569.3) 2)  Nausea and Vomiting  (ICD-787.01) 3)  Uti's, Hx of  (ICD-V13.00) 4)  Personal Hx Colonic Polyps  (ICD-V12.72) 5)  Hyperlipidemia  (ICD-272.4) 6)  Bursitis  (ICD-727.3) 7)  Internal Hemorrhoids  (ICD-455.0) 8)  Diverticulosis, Colon  (ICD-562.10) 9)  Anemia, Chronic  (ICD-281.9) 10)  Gerd  (ICD-530.81)  Current Medications (verified): 1)  Proventil Hfa 108 (90 Base) Mcg/act Aers (Albuterol Sulfate) .... As Needed 2)  Pravastatin Sodium 40 Mg Tabs (Pravastatin Sodium) .... Once Daily 3)  Furosemide 40 Mg Tabs (Furosemide) .... 3  Tablets By Mouth Once Daily 4)  Spironolactone 25 Mg Tabs (Spironolactone) .[name].. 1 Tablet By Mouth Once Daily 5)  Coumadin 5 Mg Tabs (Warfarin Sodium) .... Take As  Directed 6)  Advair Diskus 250-50 Mcg/dose Misc (Fluticasone-Salmeterol) .... 2 Puffs Once Daily 7)  Nexium 40 Mg Cpdr (Esomeprazole Magnesium) .[name].. 1 Capsule By Mouth Once Daily 8)  Pacerone 100 Mg Tabs (Amiodarone Hcl) .... Once Daily 9)  Fenofibrate Micronized 134 Mg Caps (Fenofibrate Micronized) .... One Tablet By Mouth Once Daily 10)  Metoprolol Tartrate 25 Mg Tabs (Metoprolol Tartrate) .... Once Daily 11)  Prilosec Otc 20 Mg Tbec (Omeprazole Magnesium) .... As Needed 12)  Calcium-Vitamin D 500-125 Mg-Unit Tabs (Calcium-Vitamin D) .... Once Daily 13)  Coq-10 150 Mg Caps (Coenzyme Q10) .... Once Daily 14)  Zetia 10 Mg Tabs (Ezetimibe) .... Once Daily 15)  Diovan 160 Mg Tabs (Valsartan) .... One Tablet By Mouth Once Daily 16)  Cvs Stool Softener 100 Mg Caps (Docusate Sodium) .... Three Tablets By Mouth Once Daily After Dinner  Allergies (verified): 1)  ! Ace Inhibitors       CXR  Procedure date:  07/24/2006  Findings:        Clinical data:   70 year old female, left ventricular dysfunction,   pacemaker insertion.   CHEST - 2 VIEW:   Comparison:  05/19/06.   Findings:  Left subclavian defibrillator/pacemaker has been inserted.   No pneumothorax.  The patient is status post median sternotomy for   aortic valve replacement.  The heart is mildly enlarged.  Minimal   bibasilar atelectasis without consolidation, pneumonia, congestive   heart failure, or effusion.   IMPRESSION:   1.  New left subclavian defibrillator/pacemaker.  No pneumothorax.   2.  Bibasilar atelectasis.    Read By:  Earl Gala.,  M.D.   Released By:  Earl Gala.,  M.D.

## 2010-07-04 NOTE — Procedures (Signed)
Summary: Colonoscopy/[facility]  Colonoscopy/[facility]   Imported By: Phillis Knack 08/01/2009 13:27:11  _____________________________________________________________________  External Attachment:    Type:   Image     Comment:   External Document

## 2010-07-04 NOTE — Procedures (Signed)
Summary: EGD   EGD  Procedure date:  06/13/2003  Findings:      Location: [place]  GERD Patient Name: [name], [name]. MRN: [id number] Procedure Procedures: Panendoscopy (EGD) CPT: 45809.  Personnel: Endoscopist: Docia Chuck. Henrene Pastor, MD.  Exam Location: Exam performed in Outpatient Clinic. Outpatient  Patient Consent: Procedure, Alternatives, Risks and Benefits discussed, consent obtained, from patient. Consent was obtained by the RN.  Indications Symptoms: Dysphagia.  History  Current Medications: Patient is not currently taking Coumadin.  Pre-Exam Physical: Performed Jun 13, 2003  Entire physical exam was normal.  Exam Exam Info: Maximum depth of insertion Duodenum, intended Duodenum. Patient position: on left side. Vocal cords visualized. Gastric retroflexion performed. Images taken. ASA Classification: II. Tolerance: excellent.  Sedation Meds: Patient assessed and found to be appropriate for moderate (conscious) sedation. Residual sedation present from prior procedure today.  Monitoring: BP and pulse monitoring done. Oximetry used. Supplemental O2 given  Findings Normal: Proximal Esophagus to Duodenal 2nd Portion.   Assessment  Diagnoses: 530.81: GERD.   Events  Unplanned Intervention: No unplanned interventions were required.  Unplanned Events: There were no complications. Plans Medication(s): Continue current medications.  Disposition: After procedure patient sent to recovery. After recovery patient sent home.  Scheduling: Follow-up prn.   This report was created from the original endoscopy report, which was reviewed and signed by the above listed endoscopist.   cc:  Teressa Lower, MD      The Patient

## 2010-07-04 NOTE — Procedures (Signed)
Summary: EGD   EGD  Procedure date:  04/05/2001  Findings:      Location: [place]  Findings: Esophagitis    EGD  Procedure date:  04/05/2001  Findings:      Location: [place]  Findings: Esophagitis   Patient Name: [name], [name]. MRN: [id number] Procedure Procedures: Panendoscopy (EGD) CPT: 44695.  Personnel: Endoscopist: Docia Chuck. Henrene Pastor, MD.  Exam Location: Exam performed in Outpatient Clinic. Outpatient  Patient Consent: Procedure, Alternatives, Risks and Benefits discussed, consent obtained, from patient.  Indications  Surveillance of: Duodenal ulcer.  History  Pre-Exam Physical: Performed Apr 05, 2001  Cardio-pulmonary exam, HEENT exam, Abdominal exam, Extremity exam, Neurological exam, Mental status exam WNL.  Exam Exam Info: Maximum depth of insertion Duodenum, intended Duodenum. Patient position: on left side. Vocal cords visualized. Gastric retroflexion performed. Images taken. ASA Classification: II. Tolerance: excellent.  Sedation Meds: Patient assessed and found to be appropriate for moderate (conscious) sedation. Fentanyl 75 mcg. Versed 5 mg. Cetacaine Spray  Monitoring: BP and pulse monitoring done. Oximetry used. Supplemental O2 given  Findings - ESOPHAGEAL INFLAMMATION: established as a result of infectious esophagitis. ICD9: Esophagitis: 530.10. Comments: Mild Candida esophagitis.  HEALED ULCER: in Duodenal Apex Description: Healed ulcer with mild residual stenosis but no obstruction.   Assessment Abnormal examination, see findings above.  Diagnoses: 530.10: Esophagitis.   Events  Unplanned Intervention: No unplanned interventions were required.  Unplanned Events: There were no complications. Plans Comments: Diflucan 100 mg qd X 5 days Protonix 60m qd Disposition: After procedure patient sent to recovery. After recovery patient sent home.  Scheduling: Follow-up prn.   This report was created  from the original endoscopy report, which was reviewed and signed by the above listed endoscopist.   cc:  STeressa Lower MD      BExcell Seltzer MD

## 2010-10-15 NOTE — Assessment & Plan Note (Signed)
[facility]                         GASTROENTEROLOGY OFFICE NOTE   NAME:Kathleen, [name]                        MRN:          [id number]  DATE:08/18/2007                            DOB:          [birth year]    REFERRING PHYSICIAN:  Elta Guadeloupe A. Perini, M.D.   REASON FOR CONSULTATION:  Anemia and GI complaints.   HISTORY:  This is a 70 year old white female with multiple significant  medical problems including mitral valvular heart disease for which she  has undergone noninvasive and subsequent surgical therapy,  including  St. [name] mitral valve replacement. She has also had a history of anemia,  nonischemic heart failure, cardiac arrhythmia, peptic ulcer disease, and  reflux disease. She also has a history of colon polyps. Her last  colonoscopy was performed in January 2005. This was normal. An upper  endoscopy at the same time was remarkable only for a hiatal hernia. She  was last seen in the office in November with some anemia and mild  nausea. She presents today with a 3-week history of intermittent  diarrhea, occasional constipation, and some abdominal cramping. She is  telling me that her stools have been dark. She saw Dr. Ree Edman who  checked a hemoglobin earlier this month. It was low at 9.7. Her baseline  is about 10.5. She saw Dr. Joylene Draft. Rectal exam was negative for occult  blood. Subsequent evaluation revealed normal B12 level. Iron studies  revealed an iron saturation of 14%. TIBC was elevated. Folate level was  normal. Ferritin level was normal. Liver tests were normal. Kidney  function abnormal with a creatinine of 2.2. The patient is on a number  of medications which she feels may affect her appetite.    She has no known drug allergies.   CURRENT MEDICATIONS:  Vitamin D, Spiriva, Singulair, Nasonex, Zegerid,  estradiol, fenofibrate, Crestor, furosemide, spironolactone, Coumadin,  Avapro, Bystolic, amiodarone, Advair, Lyrica.   PHYSICAL  EXAMINATION:  Well-appearing female in no acute distress.  Blood pressure is 98/58, heart rate is 64, weight is 149.6 pounds.  HEENT:  Sclerae anicteric. Conjunctivae are pink. Oral mucosa is intact.  No adenopathy.  LUNGS:  Clear.  HEART:  Regular with a systolic murmur.  ABDOMEN:  Soft without tenderness. Good bowel sounds heard. No mass.  EXTREMITIES:  Without significant edema.   IMPRESSION:  1. Anemia. Testing suggests anemia of chronic disease. No evidence of      gastrointestinal bleeding, though she did have some dark stools.      Recent Hemoccult negative. She also has a history of ulcer disease.  2. Problems with loose stools. She said she had received some      antibiotics in recent months, possibly antibiotic associated.  3. Gastroesophageal reflux disease. Stable.  4. Nausea, nonspecific.   RECOMMENDATIONS:  1. Empiric treatment with metronidazole 250 mg q.i.d. for one week.  2. Align 1 daily for two weeks.  3. Coumadin Clinic to monitor Coumadin level closely while on      metronidazole.  4. Schedule upper endoscopy to rule out esophagitis or ulcer as a      cause  for GI symptoms and anemia.  5. Change from Zegerid to Nexium per patient request.  6. Consider hematology evaluation for anemia and elevated creatinine      as she may need Epogen or some other agent. I will leave this to      the discretion of her primary care [name].     Docia Chuck. Henrene Pastor, MD  Electronically Signed    JNP/MedQ  DD: 08/18/2007  DT: 08/18/2007  Job #: [id number]   cc:   Elta Guadeloupe A. Perini, M.D.  Ermalene Searing Philip Aspen, M.D.

## 2010-10-15 NOTE — Assessment & Plan Note (Signed)
[place] OFFICE NOTE   NAME:[name], [name]                        MRN:          [id number]  DATE:09/20/2007                            DOB:          06/15/1940    HISTORY:  [name] presents today for followup  regarding problems  with diarrhea and anemia.  She was last evaluated August 18, 2007.  See  that dictation for details.  Her anemia was felt to be that of chronic  disease.  She complained of some dark stools, though was Hemoccult-  negative.  We did perform upper endoscopy August 18, 2007.  This was  negative for mucosal abnormalities.  In terms of her loose stools, she  had received some antibiotics in recent months.  She was treated with  Align as well as metronidazole for 1 week.  Her diarrhea resolved.  She  currently has 2 formed stools per day.  For her reflux disease, she was  changed from Zegerid to Nexium.  Nexium is working well without active  symptoms.  She does request a followup CBC today.  Her last blood count  was 9.7 and her baseline is 10.5.  She does have some renal  insufficiency.  She denies at this time any bleeding.   CURRENT MEDICATIONS:  Vitamin D, Spiriva, Singulair, Nasonex,  fenofibrate, Crestor, furosemide, spironolactone, Coumadin, Avapro,  Bystolic, Amiodarone, Advair, Lyrica, Nexium, and iron supplement.   PHYSICAL EXAMINATION:  Finds a well-appearing female in no acute  distress.  Blood pressure is 110/60.  Heart rate 64 and regular.  Her weight is 151  pounds.  HEENT:  Sclerae are anicteric.  Conjunctivae are pink.  Oral mucosa is  intact.  There is no adenopathy.  LUNGS:  Clear.  HEART:  Regular with systolic murmur.  ABDOMEN:  Soft without tenderness, mass or hernia.  Good bowel sounds  heard.  EXTREMITIES:  Without edema.   IMPRESSION:  1. Recent problems with diarrhea possibility antibiotic related.      Symptoms resolved after a course of metronidazole  and probiotic      Align.  2. Gastroesophageal reflux disease. Asymptomatic on Nexium.  3. Chronic anemia.  Likely due to chronic disease.  No evidence of      gastrointestinal bleeding.   RECOMMENDATION:  1. Continue Nexium for reflux disease.  2. Okay to use probiotic Align on demand if necessary.  3. A followup  CBC today.  We will share the results with the patient      and Dr. Joylene Draft. As stated previously, he may wish to consider      Hematology evaluation if anemia persists or worsens.     Docia Chuck. Henrene Pastor, MD  Electronically Signed    JNP/MedQ  DD: 09/20/2007  DT: 09/20/2007  Job #: [id number]   cc:   Elta Guadeloupe A. Perini, M.D.

## 2010-10-15 NOTE — Letter (Signed)
September 28, 2006    Quay Burow, M.D.  [phone] [address], [place], [place] [address]   RE:  GERARDINE, PELTZ  MRN:  [id number]  /  DOB:  1940/12/19   Dear [name],   Ottie Glazier came in today and she had a little bit of a stitch  abscess. This has healed up nicely.   She is scheduled to see Dr. Tami Ribas in a month or so for device  programming and you in August.   If there is anything further we can do to help in her care, please do  not hesitate to contact us. I have clarified with her that her last  followup will be at your office.    Sincerely,      Deboraha Sprang, MD, [facility]  Electronically Signed    SCK/MedQ  DD: 09/28/2006  DT: 09/28/2006  Job #: [id number]

## 2010-10-18 NOTE — Consult Note (Signed)
[facility]  Patient:    [name], [name] Visit Number: [id number] MRN: [id number]          Service Type: MED Location: [id number] Attending Physician:  Marshia Ly Dictated by:   Darene Lamer. Hoxworth, M.D. Proc. Date: 01/29/01 Admit Date:  01/29/2001                            Consultation Report  HISTORY OF PRESENT ILLNESS:  I was asked by Dr. Lenna Gilford to evaluate the patient. She is a very pleasant 70 year old white female who is admitted with intractable nausea, vomiting, diarrhea and abdominal pain.  The patients recent history is significant for a mitral valve repair performed in [place] in July of this year.  She was discharged home in early August. She states that while still in the hospital she developed frequent nausea, vomiting and epigastric discomfort.  This problem had not completely resolved when she was discharged and she had persistent nausea and vomiting at home for a couple of weeks.  She was recently admitted to [facility] from August 20 to August 23.  A CT scan of the abdomen and pelvis was obtained which showed questionable early cirrhosis and fatty liver and some thickening of the duodenal wall.  An EGD was performed by Dr. Fuller Plan which revealed nonbleeding duodenal ulcer with stricture of the duodenum to 9 mm.  The patient was started on proton pump inhibitors and improved in the hospital where she was tolerating a liquid diet and was discharged home.  Soon after discharge, however, the patient developed intermittent diarrhea stools described as dark or black up to eight times a day.  She had persistent epigastric discomfort and over the last 36-48 hours prior to admission developed frequent nausea and vomiting, unable to keep any liquids down.  No hematochezia or hematemesis.  The patient denies any previous history of significant GI complaints.  She states she has had "irritable bowel" with occasional diarrhea  or constipation over the years and occasional intolerance of spicy or acidic foods with "queasy" stomach, but no definite history of ulcer disease.  The patient is now admitted for further treatment evaluation.  PAST MEDICAL HISTORY:  Significant mainly for history of mitral valve insufficiency status post repair as above.  She has also had a vaginal hysterectomy.  She has a remote history of asthma.  She has had colonoscopic polypectomy in February of this year.  She has a diagnosis of nonischemic cardiomyopathy.  CURRENT MEDICATIONS: 1. Aspirin 81 mg daily. 2. Protonix 40 mg b.i.d. 3. Lopressor 25 mg b.i.d. 4. Lipitor 40 mg daily. 5. Lanoxin 0.125 mg a day.  ALLERGIES:  NO KNOWN DRUG ALLERGIES.  SOCIAL HISTORY:  She does not smoke cigarettes or drink alcohol.  She is married and very active.  REVIEW OF SYSTEMS:  RESPIRATORY:  Denies shortness of breath.  CARDIOVASCULAR: Occasional twinge of chest discomfort since her surgery.  No edema or shortness of breath.  GI:  Positive as above.  PHYSICAL EXAMINATION:  VITAL SIGNS:  Temperature 99, blood pressure 130/60, pulse 100, respirations 18.  GENERAL:  She is a somewhat pale, but generally well appearing white female, alert and cooperative.  SKIN:  Warm and dry.  HEENT:  Sclera nonicteric.  No adenopathy.  LUNGS:  Clear to auscultation.  CARDIOVASCULAR:  Healing right inframammary and axillary incisions, 2/6 systolic murmur.  No edema.  ABDOMEN:  There is mild diffuse tenderness  and moderate tenderness in the epigastric and right upper quadrant.  No distention or mass or guarding.  EXTREMITIES:  Without edema.  LABORATORY:  White count 9.6, hematocrit 22.4, hemoglobin 7.8.  Electrolytes, liver function tests all within normal limits.  PT and PTT normal.  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  There is evidence of ongoing blood loss, but not  severe.  The patient is currently being transfused.  Repeat endoscopy is planned.  Based on these findings, options may include bowel rest and acid reduction therapy.  If there is considered to be a significant component of edema and inflammatory change resulting in obstruction.  If the obstruction is felt to be fibrotic, consideration could be given to balloon dilatation verses surgical intervention with possible vagotomy and antrectomy.  Upper gastrointestinal series could be helpful to find obstruction if this could be tolerated.  We will follow with you. Dictated by:   Darene Lamer. Hoxworth, M.D. Attending Physician:  Marshia Ly DD:  01/29/01 TD:  01/29/01 Job: [id number]

## 2010-10-18 NOTE — H&P (Signed)
[facility]  Patient:    LISSETT, FAVORITE Visit Number: [id number] MRN: [id number]          Service Type: MED Location: [id number] Attending Physician:  Marshia Ly Dictated by:   Tammy Parrett, N.P. Adm. Date:  01/19/2001                           History and Physical  ADDENDUM  VITAL SIGNS: Weight 117 pounds (down seven pounds). Dictated by:   Tammy Parrett, N.P. Attending Physician:  Marshia Ly DD:  01/19/01 TD:  01/19/01 Job: [id number]

## 2010-10-18 NOTE — Op Note (Signed)
[name], [name]               ACCOUNT NO.:  [id number]   MEDICAL RECORD NO.:  [id number]          PATIENT TYPE:  INP   LOCATION:  2807                         FACILITY:  [facility]   PHYSICIAN:  Deboraha Sprang, MD, FACCDATE OF BIRTH:  1940-08-18   DATE OF PROCEDURE:  07/23/2006  DATE OF DISCHARGE:                               OPERATIVE REPORT   PREOPERATIVE DIAGNOSIS:  Congestive heart failure, previous mitral valve  surgery with persistently abnormal left ventricular function with left  bundle branch block.   POSTOPERATIVE DIAGNOSIS:  Congestive heart failure, previous mitral  valve surgery with persistently abnormal left ventricular function with  left bundle branch block.   PROCEDURES:  Implantation of ICD, insertion of a left ventricular pacing  lead with intraoperative defibrillation threshold testing.   Following obtaining informed consent, the patient was brought to the  electrophysiology laboratory and placed on the fluoroscopic table in  supine position.  After routine prep and drape of the left upper chest,  lidocaine was infiltrated in the sub clavicular region.  Incision was  made and carried down to layer of the prepectoral fascia using  electrocautery and sharp dissection.  The pocket was formed.  The  previously implanted epicardial leads were retained in their prepectoral  pocket.  This space was not violated until the very end and that  inadvertently.   At this point attention was turned to gaining access to the  extrathoracic left subclavian vein which was accomplished without  difficulty.  Three separate venipunctures were accomplished.   Sequentially 8 [place], 9.5 [place] and 7 [place] hemostatic tearaway  introducer sheaths were placed through which was passed a St. [name] Riata  7002 60 cm single coil active fixation defibrillator lead serial number  [id number] a Medtronic MB2 coronary sinus cannulation system and a  Medtronic 5076 52 cm fixation atrial lead  serial number [id number].  Under fluoroscopic guidance the defibrillator lead was manipulated to  the right ventricular apex where bipolar R wave was 19.8 with a pace  impedance of 511 and threshold 0.6 volts at 0.5 milliseconds.  Current  at threshold was 1.5 MA and there is no diaphragmatic pacing at 10  volts.  This lead was secured to the prepectoral fascia then attached to  the PSA in the backup mode.   Thereafter the MB2 coronary sinus cannulation catheter in conjunction  with a Wholey wire was used to cannulate the coronary sinus.  There was  a valve in the distal portion of the coronary sinus body (away from the  CS os) and there was a lateral branch that was almost __________  cannulated by the MB2.  We placed a whisper wire in this vein without  much difficulty.  There appeared to be an anterior and posteriorly  coursing branch at about halfway down the vein.  I tried to get the lead  into the superior branch and was able to get the Medtronic MB2 lead into  this position.   This point we placed the atrial lead in a remnant of the right atrial  appendage where the bipolar P-wave was 2.5  with a pace impedance of 755  and threshold shortly after screw deployment of 1.6 volts at 0.5  milliseconds.  Current at threshold was 2.9 MA.  There is no  diaphragmatic pacing at 10 volts and the current of injury was quite  brisk.  I should note that the current of injury of the RV lead was  quite poor.   At this point re-evaluation of the LV lead demonstrated that it had  fallen down out of its anteriorly coursing vein which unfortunately was  not a surprise.  At this point we placed the wire back into this branch  and removed the Medtronic lead and placed a Guidant Easy trac 4543 lead  serial number [id number].  Unfortunately I was unable to bypass the tip of  this lead into this small branch and having chosen the bipolar lead as  opposed to a unipolar lead, I tried a couple of other times  to  manipulate the lead to a little bit more distal position as the lead in  the RAO position of the Medtronic lead appeared to be at the junction of  basilar and mid third.   I was able to get the lead a little bit further so it was in the mid  third and we checked it both in the unipolar and bipolar configuration  with the intention of pacing unipolar as long as it did not have  diaphragmatic stimulation.  In the unipolar mode the L wave was about  7.2 with impedance of 532 with threshold 1.1 volts at 0.5 milliseconds.  Current at threshold was 2.5 MA and there was diaphragmatic pacing at 10  volts in the unipolar mode.  There was not diaphragmatic pacing in the  bipolar mode and the L wave was about 19 mV.  Otherwise the other  parameters were similar.   With these parameters acceptable, the RV lead and the RA lead having  been secured the prepectoral fascia, the coronary sinus cannulation  system was removed under fluoroscopic guidance, parameters confirmed and  then the LV lead was secured to the prepectoral fascia and hemostatic  sutures were secured.   The leads were then attached to a Medtronic Concerto [id number] pulse  generator, serial number [id number].  Through the device the bipolar P-  wave was 2.1 with a pace impedance of 536, a threshold of 1 V at 0.4.  The bipolar R wave was 19.8 with a pace impedance of 552, a threshold of  1 V at 1.4.  The LV impedance was 496 with a threshold of 1 V at 0.4.  The distal coil impedance was 65 ohms and there was no proximal coil  impedance.  This was a single coil lead.   At this point the device was implanted.  The pocket was copiously  irrigated with antibiotic containing saline solution.  Hemostasis was  assured and the leads and the pulse generator were placed in the pocket  secured to the prepectoral fascia.   Defibrillation threshold testing was then undertaken.  Ventricular fibrillation was induced via the T-wave shock.   After a total duration  of about 7 seconds a 15 joules shock was delivered through a measured  resistance of 64 ohms failing to terminate ventricular fibrillation.  After a total duration of 16 seconds, a 25 joules shock was delivered  through a measured resistance of 61 ohms terminating ventricular  fibrillation and restoring an AV paced rhythm.   After wait of 5-6 minutes, ventricular fibrillation  was re-induced via  the T-wave shock.  After a total duration of 8 seconds, a 25 joules  shock was delivered through a measured resistance of 61 ohms terminating  ventricular fibrillation restoring sinus rhythm.  With these parameters  acceptable the device was implanted.  The wound was then closed in three  layers in normal fashion.  The pocket having been copiously irrigated  with antibiotic containing saline solution and hemostasis having been  assured.  The wound was closed in three layers.  The wound was washed  and dried and a benzoin Steri-Strip dressing was applied.  Needle  counts, sponge counts and instrument counts were correct at the end of  the procedure according to staff.  The patient tolerated the procedure  without apparent complication.      Deboraha Sprang, MD, [facility]  Electronically Signed     SCK/MEDQ  D:  07/23/2006  T:  07/23/2006  Job:  [id number]   cc:   Electrophys lab  Quay Burow, M.D.  [facility] pacemaker cl

## 2010-10-18 NOTE — Discharge Summary (Signed)
[name], [name]               ACCOUNT NO.:  [id number]   MEDICAL RECORD NO.:  [id number]          PATIENT TYPE:  INP   LOCATION:  3705                         FACILITY:  [facility]   PHYSICIAN:  Quay Burow, M.D.   DATE OF BIRTH:  1940/06/26   DATE OF ADMISSION:  12/08/2006  DATE OF DISCHARGE:  12/12/2006                               DISCHARGE SUMMARY   DISCHARGE DIAGNOSES:  1. Subtherapeutic INR.  2. History of St. [name] mitral valve replacement.  3. 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  4. History of biventricular implantable cardioverter-defibrillator.  5. Mild anemia.  6. Treated dyslipidemia.  7. 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  8. Chronic obstructive pulmonary disease.   HOSPITAL COURSE:  The patient is a 70 year old female known to [place] with  St. [name] MVR in 2007.  She originally had a Carpentier-Edwards mitral  valve in 2002, but had worsening MR.  She has severe LV dysfunction.  She also had a BiV placed in February 2008.  She was admitted from the  office with a subtherapeutic INR.  She says she has been compliant with  her Coumadin.  The patient was admitted by Dr. Rollene Fare and started on  IV heparin.  Her INR on admission was 1.5.  Coumadin was adjusted by  Pharmacy.  By the 12th her INR was therapeutic at 2.5 and we felt she  could be discharged.   DISCHARGE MEDICATIONS:  1. Spiriva daily.  2. Singulair daily.  3. Estradiol daily.  4. Tricor 48 mg a day.  5. Flonase daily.  6. Zegerid 40 mg a day.  7. Lasix 40 mg a day.  8. Amiodarone 100 mg a day.  9. Cozaar 100 mg a day.  10.Spironolactone 25 mg a day.  11.Coumadin 5 mg a day as directed; she will take a half a tablet a      day.  12.She will stop her simvastatin and start Crestor 10 mg a day.  13.She is not to get generic warfarin.   LABORATORY DATA AT DISCHARGE:  Sodium 133, potassium 4, BUN 23,  creatinine 1.6.  White count 8.8, hemoglobin  10.5, hematocrit 31.4,  platelets 274,000.   DISPOSITION:  The patient was discharged in stable condition and will  have a pro time checked in the office Wednesday at our Coumadin clinic  and be followed at our Coumadin clinic for now on.  I should also note  that the patient is on Bystolic now, 10 mg a day, as well as Restoril 15  mg h.s. p.r.n.      Erlene Quan, P.A.      Quay Burow, M.D.  Electronically Signed    LKK/MEDQ  D:  12/24/2006  T:  12/25/2006  Job:  [id number]

## 2010-10-18 NOTE — Consult Note (Signed)
NAMEKEYAIRRA, KOLINSKI               ACCOUNT NO.:  [id number]   MEDICAL RECORD NO.:  [id number]          PATIENT TYPE:  INP   LOCATION:  2913                         FACILITY:  [facility]   PHYSICIAN:  Shaune Pascal. Bensimhon, MDDATE OF BIRTH:  [birth year]   DATE OF CONSULTATION:  04/03/2006  DATE OF DISCHARGE:  04/04/2006                                   CONSULTATION   REASON FOR CONSULTATION:  Congestive heart failure.   HISTORY OF PRESENT ILLNESS:  Ms. Wisecup was diagnosed with nonischemic  cardiomyopathy in 2002 at which time she was found to have an EF of about  25% with severe mitral valve prolapse and severe mitral regurgitation.  She  underwent a mitral valve ring with Dr. Lewayne Bunting would in [place], [place], at that time.  She was subsequently followed by Dr. Quay Burow  at [facility].  Apparently she had a Cardiolite in June  2005 which showed an EF of 63%.  Unfortunately, she recently developed  progressive congestive heart failure symptoms.  She underwent an  echocardiogram which showed an EF of, once again, about the 25% with severe  mitral regurgitation.  On February 25, 2006, she underwent right and left  heart catheterization by Dr. Gwenlyn Found.  This showed pulmonary capillary wedge  pressure of 23 with a FICK cardiac output of 3 liters a minute with index of  1.9 liters per minute per meter squared.  PA pressures were 34/26 with a  mean of 30.  Her coronary arteries were normal.  There was evidence of  severe mitral regurgitation.  She was subsequently referred to Dr. Cyndia Bent  and underwent St. [name] mitral valve replacement as well as epicardial leads  for possible cardiac resynchronization therapy.  She presented to Dr.  Kennon Holter office recently with severe worsening heart failure.  There is  significant volume overload.  Echocardiogram showed of biventricular failure  with an LV ejection fraction of 5-10%.  She was admitted. BNP at that time  was  greater than 3200, her blood pressure was in the 70 to 80 range.  She  was subsequently started on dobutamine.  We are asked to see her for further  evaluation.  There is also some question of peptic ulcer disease and melena.  She says she was evaluated by the GI service who found her to have heme-  positive stools and she was thought to have a slow upper GI bleed.  EGD was  scheduled but due to her cardiac condition, this was not performed.  Of  note, her INR was 10.2 with evidence of elevated liver function tests  consistent with shock liver.  She was given 2 mg of IV vitamin K.   REVIEW OF SYSTEMS:  Currently unobtainable as she is quite lethargic.   PAST MEDICAL HISTORY:  1. Nonischemic cardiomyopathy possibly secondary to severe mitral      regurgitation in the setting of severe mitral valve prolapse.      a.     Status post mitral valve ring in 2002 by Dr. Lewayne Bunting.      b.  Status post St. [name] mitral valve replacement September 2007.      c.     Normal coronary arteries by catheterization.      d.     Most recent echocardiogram shows biventricular failure with an       EF of less than 10%.  2. History of peptic ulcer disease with GI bleeding in 2002.  3. Asthma.  4. Hyperlipidemia.  5. Chronic left bundle branch block.  6. Degenerative joint disease.  7. Reported history of fibromyalgia/chronic pain syndrome.   CURRENT MEDICATIONS:  Lyrica 50 mg t.i.d., Lopressor 50 b.i.d., Avapro 37.5  daily, Tricor 48 mg a day, warfarin 3 mg a day, Protonix 40 a day, Estradiol  1 mg a day, Nasonex, Advair, Tranxene 3.75 mg t.i.d., Singulair 10 mg  q.h.s.[name]   ALLERGIES:  She is apparently intolerant to STATINS. She is also allergic to  PENICILLIN, ASPIRIN which causes ulcers, and NIACIN she was intolerant of.   SOCIAL HISTORY:  She has four children.  She has a history tobacco but quit  in 1996.  She is married and lives with her husband in [place].  She  denies any alcohol or  illicit drugs.   FAMILY HISTORY:  Her parents died in their 49s due to myocardial infarction.  She has two sons with a history of alcoholism and mood disorders.   PHYSICAL EXAMINATION:  GENERAL:  She is very lethargic but can be aroused to answer our questions.  She feels short of breath and appears to be in mild respiratory distress on  exam.  VITAL SIGNS:  Blood pressure is 79/40 on 5 mcg/kg of dopamine, heart rate is  75.  HEENT:  Sclerae anicteric.  EOMI.  There is no xanthelasmas.  Mucous  membranes are moist.  NECK:  Supple.  There is JVD up to the ear with prominent CV waves.  Carotids are 1+ bilaterally without  bruits.  There is no lymphadenopathy or  thyromegaly.  CARDIAC:  She has regular rate and rhythm with a 2/6 systolic ejection  murmur at the left sternal border.  S1 is mechanical and crisp.  Her PMI is  widely displaced with an S3.  LUNGS:  Clear anteriorly.  ABDOMEN:  Soft, nontender, nondistended.  There is no hepatosplenomegaly  appreciated.  There are no masses or bruits.  There are hypoactive bowel  sounds.  EXTREMITIES:  Warm.  There is no cyanosis, clubbing or edema.  Distal pulses  are 1+.  NEUROLOGICAL:  She is quite lethargic but can arouse to questions.  Cranial  nerves II-XII are grossly intact.  She moves all four extremities.  There is  no asterixis.   LABORATORY DATA:  Current labs show a repeat INR 3.6.  BNP is greater than  3200.  Sodium is 140, potassium 3.5, chloride 99, CO2 33, glucose 97, BUN 9,  creatinine 0.8.  AST is 188, ALT is 376, alk phos is 136.  Total bilirubin  is 1.6, albumin is 2.5.  CBC with white count 9.4.  hemoglobin 10.3,  platelet count is 124.  Lipase is normal at 27.  EKG from April 01, 2006,  shows left bundle branch block with a rate of 83.   ASSESSMENT/PLAN:  Ms. Luster is now showing evidence of cardiogenic shock  in the setting of biventricular failure after a recent mitral valve replacement.  She currently has  signs and symptoms of severe low output  heart failure despite dobutamine therapy.  At this point I would  recommend:  1. She will need mechanical support with an intra-aortic balloon pump for      counter pulsation.  Given her INR, this may be best done with      supplemental FFP.  2. I would stop her beta blocker.  3. I would start digoxin in addition to her dobutamine.  4. She will need me urgent transfer to the [facility] for      possible BIVAD support versus transplant evaluation if she is not      improving on her balloon pump.   I have discussed these findings with Dr. Donnella Bi and Dr. Adrian Prows.  I will try to make arrangements to have her transferred to [facility] evening.      Shaune Pascal. Bensimhon, MD  Electronically Signed     DRB/MEDQ  D:  04/03/2006  T:  04/04/2006  Job:  [id number]

## 2010-10-18 NOTE — Discharge Summary (Signed)
[name], [name] NO.:  [id number]   MEDICAL RECORD NO.:  [id number]          PATIENT TYPE:  INP   LOCATION:  2001                         FACILITY:  [facility]   PHYSICIAN:  Deboraha Sprang, MD, FACCDATE OF BIRTH:  24-Sep-1940   DATE OF ADMISSION:  07/23/2006  DATE OF DISCHARGE:  07/26/2006                               DISCHARGE SUMMARY   PRIMARY CAREGIVER AND PULMONOLOGIST:  Dr. Teressa Lower.   CARDIOLOGIST:  Dr. Quay Burow.   ELECTROPHYSIOLOGIST:  Dr. Virl Axe   ALLERGIES:  This patient has allergies to ASPIRIN, PENICILLIN, STATINS  and NIACIN.   PRIMARY DIAGNOSES:  1. Chronic fatigue.  2. The patient is discharging from [employer] day      #3, status post implant of Medtronic CONCERTO [id number] CRT-D (did      not use implanted epicardial leads).  3. Heparin bridge until Coumadin levels are therapeutic.  4. Echocardiogram June 24, 2006, ejection fraction less than 25%,      severe global hypokinesis of the left ventricle.  Ejection fraction      did not improve after mitral valve replacement in September 2007.   SECONDARY DIAGNOSES:  1. New York Heart Association Class III chronic systolic and diastolic      congestive heart failure.  2. Left bundle branch block.  3. 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  This study also showed severe      global hypokinesis.  4. History of mitral valve annuloplasty by Dr. Amador Cunas, December 28, 2000.  The procedure was a posterior mitral annuloplasty through a      right thoracotomy incision.  5. Status post mitral valve replacement February 26, 2006, with      implantation of 25 mm St. [name] bileaflet mechanical valve, the      patient is on lifelong Coumadin.  6. Acute on chronic mixed congestive heart failure episode November      2007, requiring wide-open pressor support and intra-aortic balloon      pump with transferred to  [facility] for further critical care.  The      patient presumably developed atrial fibrillation during that      hospitalization.  The patient is on amiodarone.  7. Persantine Cardiolite study done in 2002, demonstrating an old      anterior infarct with mild peri-infarct ischemia.  8. History of peptic ulcer disease/gastrointestinal bleed.  9. Asthma.  10.Degenerative joint disease.  11.Treated dyslipidemia.   PROCEDURE:  July 23, 2006, implantation of Medtronic CONCERTO dual-  chamber cardioverter defibrillator with left ventricular biventricular  pacing capability, Dr. Virl Axe.  Defibrillator threshold study was  less than or equal to 25 joules.   BRIEF HISTORY:  [name] is a 70 year old female.  She has a history  of mitral valve repair in 2002 that failed prompting her to go for  mitral valve replacement in September 2007.  At that time epicardial  leads were placed.   The patient was readmitted in acute on  chronic congestive diastolic and  systolic class IV congestive failure, November 2007.  She required an  intra-aortic balloon pump as well as full-out pressor support.  She was  transferred to [facility].  She was subsequently discharged and discharged on  amiodarone, perhaps she developed atrial fibrillation there.  Currently  she is in sinus rhythm.  A reassessment of her ejection fraction on  June 24, 2006, shows that following mitral valve replacement, there  is no significant improvement.  It is noted that her coronary arteries  are normal.  Currently she has class III congestive heart failure.  She  is short of breath and fatigued most of the time.  She definitely  qualifies for cardioverter defibrillator implantation with her left  bundle branch block and continued class III congestive heart failure  symptoms, a left ventricular lead would be placed.  If possible, we will  use the epicardial leads; if not, a separate lead will be placed.  The  benefits as well  as the risks have been described to the patient, she  wishes to proceed.  This will be done at the earliest possible  opportunity.   HOSPITAL COURSE:  The patient presented electively on July 23, 2006.  She underwent implantation of the Medtronic CONCERTO BiV ICD by Dr.  Virl Axe.  She has no hematoma postoperatively.  She has some  soreness which is much better on Percocet.   Postoperative day #1, she is maintaining sinus rhythm.  Her device has  been interrogated and it shows that all leads are within normal limits  as far as electronic values are concerned. Chest x-ray shows leads are  in appropriate position.  Mobility of the left arm has been discussed  with the patient as well as incision care.  On postprocedure day #1, she  was placed on low-dose IV heparin while Coumadin therapy was restarted.  Her INR on the day of admission, July 23, 2006, was 1.6; on  postprocedure day #1, it was also 1.6.  She will probably be with [place]  over the weekend for IV heparin bridging until her Coumadin dose is  therapeutic and INR greater than 2.5.   DISCHARGE MEDICATIONS:  1. Advair 250/50 one inhalation twice daily.  2. Singulair 10 mg daily.  3. Xopenex inhaled nebulizer 1.25 mg twice daily.  4. Estradiol 1 mg daily.  5. Fenofibrate 48 mg daily.  6. Flonase 50 mg 2 sprays per nostril daily.  7. Zegerid or Nexium 40 mg daily.  8. Lasix 40 mg daily.  9. Coreg 12.5 mg twice daily.  10.Amiodarone 200 mg daily.,  11.Cozaar 100 mg daily.  12.Spironolactone 25 mg daily.  13.Coumadin 1 mg daily.  14.Simvastatin 20 mg daily at bedtime.   FOLLOW-UP APPOINTMENTS:  She is to keep her incision dry for the next 7  days and to sponge bathe until Thursday, July 30, 2006.  1. Her first appointment will be for a protime Tuesday, July 28, 2006, the results to go do [place]. 2. ICD clinic at [facility], [address] Monday,       August 17, 2006, at 9:40.  3. The device will be reprogrammed sometime after Oct 21, 2006, at      [place], their office will call with      an appointment.   LABORATORY STUDIES:  Pertinent to this admission were taken July 16, 2006, white cells 8.6,  hemoglobin 10.5, hematocrit 30, platelets 305.  Protime is 30.1, INR 2.75.  Serum electrolytes with sodium 135,  potassium 4.1,  chloride 97, carbonate 31 BUN is 24, creatinine 1.5, glucose 89.  Once  again protime on post operative day #1 is 19.5, INR 1.6.   This is greater than 45 minutes.      Sueanne Margarita, [place]      Deboraha Sprang, MD, [facility]  Electronically Signed    GM/MEDQ  D:  07/24/2006  T:  07/24/2006  Job:  [id number]   cc:   Deborra Medina. Lenna Gilford, MD  Quay Burow, M.D.

## 2010-10-18 NOTE — Cardiovascular Report (Signed)
[place]  Patient:    [name], [name]                        MRN: [id number] Proc. Date: 08/19/00 Attending:  Minus Breeding, M.D. [facility]                        Cardiac Catheterization  PRIMARY PHYSICIAN:  Deborra Medina. Lenna Gilford, M.D.  INDICATIONS:  Evaluate patient with cardiomyopathy and a Cardiolite suggestive of an anterior infarct.  DESCRIPTION OF PROCEDURE:  Left heart catheterization was performed via the right femoral artery.  The artery was cannulated using an anterior wall puncture.  A #6 French arterial sheath was inserted via the modified Seldinger technique.  Preformed Judkins and a pigtail catheter were utilized.  The patient tolerated the procedure well and left the lab in stable condition.  RESULTS:  HEMODYNAMICS:  LV 102/13, AO 107/47.  CORONARY ARTERIOGRAPHY:  Left main:  The left main had a distal 25% stenosis.  Left anterior descending:  The LAD had a proximal, long 25% stenosis and mid long 25%.  Circumflex:  The circumflex was normal.  Right coronary artery:  The right coronary artery was a dominant vessel. There were luminal irregularities.  LEFT VENTRICULOGRAM:  The left ventriculogram was obtained in the RAO projection.  The EF was approximately 25% with severe global hypokinesis. There was severe mitral regurgitation.  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  PLAN:  The patient will have a transesophageal echocardiogram to consider mitral valve repair or replacement.  She will continue to have aggressive management of congestive heart failure. DD:  08/19/00 TD:  08/20/00 Job: [id number]

## 2010-10-18 NOTE — Discharge Summary (Signed)
[name], [name]               ACCOUNT NO.:  [id number]   MEDICAL RECORD NO.:  [id number]          PATIENT TYPE:  INP   LOCATION:  2016                         FACILITY:  [facility]   PHYSICIAN:  Gilford Raid, M.D.     DATE OF BIRTH:  07/04/1940   DATE OF ADMISSION:  02/25/2006  DATE OF DISCHARGE:                                 DISCHARGE SUMMARY   CARDIOLOGIST:  Dr. Quay Burow.   DISCHARGE DATE:  Tentatively in 1-2 days.   ADMISSION DIAGNOSIS:  Moderate to severe mitral regurgitation with mild  mitral stenosis with severe left ventricular dysfunction, status post mitral  valve repair in 2002.   DISCHARGE DIAGNOSES:  1. Re-do sternotomy with mitral valve replacement using St. [name]      mechanical valve.  2. Insertion of two Medtronic left ventricular epicardial pacing leads.  3. Coronary artery disease.  4. Congestive heart failure, class III.  5. Status post mitral valve annuloplasty, 2002.  Initial improvement of      left ventricular function.  6. Left bundle branch block.  7. Hypertension.  8. Hyperlipidemia.  9. Fibromyalgia.  10.Mitral valve prolapse.   CONSULTATION:  February 25, 2006:  Dr. Lovena Le was consulted for EP.   PROCEDURES:  1. On February 25, 2006, the patient underwent cardiac catheterization by      Dr. Quay Burow.  2. On February 26, 2006, patient underwent median sternotomy,      extracorporeal circulation, re-do sternotomy with mitral valve      replacement using 25-mm St. [name] mechanical valve, insertion of two      Medtronic left ventricular epicardial pacing leads by Dr. Gilford Raid.   HISTORY AND PHYSICAL:  This is a 70 year old white female with history of  mitral valve ring placed by Dr. Berle Mull at [facility] on December 28, 2000.  The  patient has had normal coronary arteries at that time.  Other problems  included severe MR, hypertension and hyperlipidemia.  A 2D echo performed in  followup revealed decrease in LVEF down to the 25%  range with moderate to  severe MR.  She was referred to Dr. Cyndia Bent and Dr. Gwenlyn Found for consideration  of mitral valve replacement, and a right and left heart cath is being  performed to defined her coronary anatomy and LV function.   HOSPITAL COURSE:  Patient underwent heart cardiac catheterization by Dr.  Gwenlyn Found.  Due to the worsening of her LV function and a questionable mass in  the posterior leaflet, it was best thought that the patient undergo a mitral  valve replacement.  Her surgery was scheduled for February 26, 2006.  Dr.  Lovena Le was consulted on February 25, 2006, for a possible LV epicardial  lead placement.  He did not feel a need to proceed with the ICD implantation  currently.  However, he thought best to have a repeat echo in 6-8 weeks  following surgery to assess her LV function at that time.   Patient underwent mitral valve repair without any complications.  She was  then transferred to the ICU.  Patient was extubated on February 26, 2006,  and she was alert.  She had good urine output, and her chest tube output was  low.   On postop day #1, patient did have some postop delirium and confusion.  Her  narcotics were discontinued and she was not given any Phenergan.  She did  improve very slowly.  On postop day #8, she was much more alert and oriented  than she had been before.  Patient did experience some acute blood loss  anemia.  She did not require any blood transfusion.  Her hemoglobin and  hematocrit have raised appropriately.  Patient did experience volume  overload postoperatively.  While in the ICU, she was started on a Lasix  strip.  She was started on p.o. Lasix on March 06, 2006.  She is being  diuresed and responding appropriately.  Currently her weight is 139 pounds;  preoperative weight was 140 pounds.  The patient has remained in normal  sinus rhythm.  Her blood pressure has been well controlled on Lopressor.   Patient was started on Coumadin on February 28, 2006.  She was given  Coumadin until her INR was therapeutic.  Patient's ARB was restarted on  February 28, 2006.  Patient experienced some hypokalemia and she was  replaced appropriately.  Her potassium did rise appropriately.  Her chest x-  rays have remained stable.  She did have some increase in atelectasis  postoperatively.  She was encouraged to do her incentive spirometry and a  flutter valve was given.  Patient was ambulating with cardiac rehab, with a  fairly steady gait and assist x2.  Patient was finally able to be weaned  from her oxygen on March 08, 2006.  Currently she is 95% on room air.   On March 07, 2006, patient did have an increase in her liver function tests  and her Vytorin was discontinued.  Her liver function tests  then started to  improve.   PHYSICAL EXAMINATION:  VITAL SIGNS:  Blood pressure 125/63, heart rate 60,  respirations 20, temperature 98, O2 sat 95% on room air.  CBG 117/147/130.  Telemetry shows normal sinus rhythm.  CARDIOVASCULAR:  Regular rate and rhythm.  Good valve click.  RESPIRATION:  Decreased breath sounds at bases.  ABDOMEN:  Benign.  EXTREMITIES:  Trace edema.  INCISIONS:  Clean, dry, and intact.   LABORATORIES:  Show white blood cell count of 12.3, hemoglobin 12.8,  hematocrit 37.6, platelet count of 376.  BMP showed sodium 142, potassium  4.3, chloride 94, CO2 35, BUN 7, creatinine 0.9, and glucose 110.  INR 3.  PT 32.7.  AST 140, ALT 196, alk phos 181, total bilirubin 1.1, albumin of  2.7.   Patient will be discharged home in the next 1-2 days provided she remains in  normal sinus rhythm, her mental status is improved, and her INR is  therapeutic.   MEDICATIONS:  1. Atacand 4 mg p.o. daily.  2. Lopressor 25 mg p.o. b.i.d.  3. Ultram 50 mg 1-2 tablets every 4 hours p.r.n.  4. Estradiol 1 mg p.o. daily.  5. Flonase 2 sprays daily.  6. Advair 250/50 inhalation, 1 inhalation b.i.d. 7. Clorazepate 7.5 mg p.o. t.i.d. p.r.n.   8. Singulair 10 mg p.o. daily.  9. Xopenex inhaler t.i.d.  10.Zegerid 40 mg p.o. daily.  11.Cyclobenzaprine 2 mg p.o. nightly p.r.n.  12.Lyrica 50 mg p.o. t.i.d.  13.Coumadin.  Patient will be given a Coumadin dose at time of discharge.   INSTRUMENT:  Patient instructed  to follow a low-fat, low-salt diet.  No  driving.  No lifting greater than 10 pounds for 3 weeks.  Patient is to  continue her breathing exercises.  She is to ambulate 3-4x daily and  increase activity as tolerated.  She may shower and clean her incisions with  mild soap and water.  She is to call the office if any wound problems should  arise such as incision erythema, purulent drainage, temperature greater than  101.5 or any increase in confusion.  Patient is to obtain her INR blood work  at Dr. Kennon Holter office in 2 days.  They will monitor her INR level and keep  it therapeutic.   FOLLOWUP:  Patient will have a followup appointment with Dr. Cyndia Bent in 3  weeks.  The office will contact her for the date and time of her  appointment.  She is to call Dr. Kennon Holter office for an appointment in 2  weeks after discharge.  She will have a chest x-ray taken at Dr. Kennon Holter  office.      Richardson Dopp, [place]      Gilford Raid, M.D.  Electronically Signed    JMW/MEDQ  D:  03/08/2006  T:  03/08/2006  Job:  [id number]   cc:   Dr. Gwenlyn Found

## 2010-10-18 NOTE — Op Note (Signed)
[name], [name]               ACCOUNT NO.:  [id number]   MEDICAL RECORD NO.:  [id number]          PATIENT TYPE:  INP   LOCATION:  2309                         FACILITY:  [facility]   PHYSICIAN:  Gilford Raid, M.D.     DATE OF BIRTH:  1940-11-14   DATE OF PROCEDURE:  02/26/2006  DATE OF DISCHARGE:                                 OPERATIVE REPORT   REFERRING PHYSICIAN:  Dr. Quay Burow.   PREOPERATIVE DIAGNOSIS:  Moderate to severe mitral regurgitation and with  mild mitral stenosis with severe left ventricular dysfunction, status post  mitral valve repair in 2002.   POSTOPERATIVE DIAGNOSIS:  Moderate to severe mitral regurgitation and with  mild mitral stenosis with severe left ventricular dysfunction, status post  mitral valve repair in 2002.   OPERATIVE PROCEDURE:  1. Median sternotomy, extracorporeal circulation, redo sternotomy with      mitral valve replacement using a 25-mm St. [name] mechanical valve.  2. Insertion of 2 Medtronic left ventricular epicardial pacing leads.   ATTENDING SURGEON:  Dr. Gilford Raid   ASSISTANT:  Lanelle Bal, MD   SECOND ASSISTANT:  Felipa Furnace, [facility]   ANESTHESIA:  General endotracheal.   CLINICAL HISTORY:  This patient is a 70 year old woman with a history of  mitral valve disease who underwent mitral annuloplasty by Dr. Amador Cunas on  December 28, 2000.  The patient apparently had a posterior mitral annuloplasty  performed through a right thoracotomy incision.  She had normal coronary  arteries at that time.  At that time, her echocardiogram had shown 4+ mitral  regurgitation with severe diffuse left ventricular hypokinesis with an  ejection fraction of 25-35%.  Her coronary arteries were normal as mentioned  above.  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  She has been followed since then with annual echocardiogram showing  ejection fraction of 35-45%.  Over the past 6 months, she has a history of  progressive lethargy and fatigue as well as shortness of breath with  exertion and mild chest pressure.  An echocardiogram on December 24, 2005,  showed an ejection fraction of less than or equal to 25% with severe  anterior wall hypokinesis.  A mass was seen on the posterior mitral leaflet  consistent with but not diagnostic of a vegetation.  There was mild to  moderate mitral regurgitation at that time.  Because of recent worsening of  her left ventricular function and a questionable mass on the posterior  mitral leaflet, she underwent transesophageal echocardiogram on January 21, 2006, which showed moderate to marked left ventricular dilatation with an  ejection fraction of about 20%.  The aortic valve was thickened without  stenosis or insufficiency.  There are mild atheromatous changes within the  descending aorta.  Examination of the mitral valve showed fixation in the  posterior mitral leaflet consistent with prior mitral valve repair.  There  was mild restriction in most of the anterior leaflet with an anteriorly  directed jet of mitral regurgitation.  There is  mild mitral stenosis with a  peak mitral valve gradient of 16 and a mean gradient of 4.7.  There was  moderate to severe mitral valve regurgitation with reversal of flow in both  right and left pulmonary veins.  The left atrium was moderately to markedly  dilated with no left atrial thrombus noted.  After review of these findings,  it was felt that redo surgery for mitral valve replacement was indicated.  She had a cardiac catheterization performed yesterday which showed normal  coronary arteries.  EF was 20-25%.  Cardiac index was 1.9-2.0.  PA pressure  was 34/26 with a wedge pressure of 23.  After review of these studies and  examination of the patient, I discussed the operative procedure with her and  her family.  We discussed alternatives, benefits,  and risks including  bleeding, blood transfusion, infection, stroke, myocardial infarction, organ  failure, and death.  We also discussed the possibility that mitral valve  replacement may not improve her left ventricular function.  We discussed the  alternatives for valve replacement and given her age of 70 years, I  recommended a St. [name] mechanical valve which she agreed with.  She had no  contraindication to anticoagulation and understood the need for lifelong  anticoagulation with this valve.  I also discussed placing epicardial left  ventricular pacing leads at the time of surgery as recommended by cardiology  and electrophysiology in case the patient needed a biventricular pacemaker  for persistent heart failure.   OPERATIVE PROCEDURE:  The patient was taken to the operating room and placed  on table in supine position.  After induction of general endotracheal  anesthesia, the chest, abdomen, and both lower extremities were prepped and  draped in the usual sterile manner.  A transesophageal echocardiogram was  performed by anesthesiology.  With the patient completely unloaded, there  was only mild mitral regurgitation.  As we increased blood pressure and  filling, this increased to moderate mitral regurgitation with a jet directed  over the anterior mitral leaflet, hugging the left atrial wall.  We were  never able to demonstrate reversal of flow in the pulmonary veins.  There is  mild mitral stenosis.  There was severe left ventricular dysfunction with  ejection fraction estimated at about 15-20%.  Right ventricular function  appeared well-preserved.  The septum was dyskinetic, and the posterior wall  was severely hypokinetic.  The lateral wall was moving fairly well.  The  anterior wall appeared mildly hypokinetic.   Then the chest was opened through a median sternotomy incision, the pericardium of the midline.  There were dense adhesions present over the  right atrium with  adhesion of the right lung to the right side of the heart  from the previous surgery through a right thoracotomy incision.  The  ascending aorta had no palpable plaques in it.   Then the patient was heparinized and when an adequate __________ was  achieved, the distal ascending aorta was cannulated using a 20-French aortic  cannula for arterial inflow.  Venous outflow was achieved using bicaval  venous cannulation with a 24-French metal tip right angle cannula placed  through a pursestring suture on the superior vena cava and a 36-French  plastic right angle cannula placed through a pursestring suture in the low  right atrium.  An antegrade cardioplegia and vent cannula was inserted in  the aortic root.   The patient was placed on cardiopulmonary bypass, and the remainder of the  heart dissected from  the pericardium.  Given the density of adhesions  between the right lung and the right atrium and interatrial groove, I felt  that performing the surgery through a transseptal approach would be best.   Then the heart was retracted upwards into Medtronic epicardial screw-in  leads were placed high up on the lateral wall which appeared to be the area  of the heart that moved the best.  These were brought through a small  opening in the left pericardium anterior to the phrenic nerve and placed  into the pleural space for the rest the procedure.  Then the aorta was  crossclamped and 500 mL of cold blood antegrade cardioplegia was  administered in the aortic root with quick arrest of the heart.  Systemic  hypothermia to 28 degrees centigrade and topical hypothermic ice saline was  used.  A temperature probe was placed in the septum and insulating pad in  the pericardium.  Additional doses of cardioplegia were given in an  antegrade manner at approximately 20 minute intervals to maintain myocardial  temperature around 10 degrees centigrade.   The superior and inferior vena cavae were then  encircled with tapes and  occluded.  Then a transseptal incision was performed through the right and  left atrium and across the interatrial septum.  This provided good exposure  of the mitral valve.  The valve appeared markedly abnormal with marked  thickening and fixation of the posterior mitral leaflet.  The chordae to the  posterior leaflet were essentially nonexistent with the leaflet scarred down  to the papillary muscles.  The anterior leaflet was thickened.  There was  also marked shortening of the chordae to the anterior leaflet.  There is  partial fusion of the 2 commissures of the valve.  The valve was tested with  iced saline solution and was grossly incompetent as the ventricle filled.  I  felt this would be best suited for replacement.  Then the posterior mitral  annuloplasty ring was removed.  Care was taken to remove all debris and sutures.  The posterior leaflet was debrided since it was thickened and  fixed in position.  The anterior leaflet was excised.  The annulus was then  sized, and a 25-mm St. [name] mechanical valve was chosen.  This had model  number [id number], serial number [id number].  Then a series of pledgeted 2-0  Ethibond horizontal mattress sutures were placed around the annulus with  pledgets in a supra-annular position.  The sutures were placed through the  sewing ring with the valve lowered into place.  The sutures were tied  sequentially.  The valve seated nicely.  The leaflets were moving normally.  The patient was then rewarmed to 37 degrees centigrade.  The transseptal  incision was closed in 2 layers using continuous 3-0 Prolene suture.  Then  the left side of the heart was de-aired and the head placed in a  Trendelenburg position.  Crossclamp was removed with time 99 minutes.  There  is spontaneous return of sinus rhythm and then ventricular fibrillation, and  the patient was defibrillated back into sinus rhythm.  Two temporary right  ventricular and  right atrial pacing wires placed and brought out through the  skin.   When the patient had rewarmed to 37 degrees centigrade and had reperfused,  she was weaned from cardiopulmonary bypass on low-dose dopamine and  milrinone.  Total bypass time was 156 minutes.  Cardiac function appeared  slightly improved with cardiac output of 4-5 Lpm.  Transesophageal  echocardiogram showed a normal functioning St. [name] mechanical valve mitral  prosthesis.  There was no evidence of perivalvular leak or regurgitation  other than a small jet at the hinges.  Aortic valve function appeared  unchanged.  Then protamine was given, and the venous and the aortic cannulas  were removed without difficulty.  Hemostasis was achieved.  The patient was  given 10 units of platelets due to obvious coagulopathy.  This resulted in  adequate hemostasis.   Then a transverse incision was made below the left clavicle, and the  subcutaneous pocket was developed just anterior to the pectoralis fascia.  The 2 left ventricular epicardial leads were tunneled through the left  anterior chest wall into this pocket and coiled up.  The tips were covered  with caps.  Then 4 chest tubes were placed with bilateral pleural tubes, a  tube in the posterior pericardium, 1 in the anterior mediastinum.  The  sternum was closed with #6 __________ sterile wires.  Fascia was closed with  continuous #1 Vicryl suture.  The subcutaneous tissue was closed with  continuous 2-0 Vicryl and skin with 3-0 Vicryl subcuticular closure.  The  pacemaker lead pocket was closed in a similar manner.  The sponge, needle,  and instrument counts correct according to scrub nurse.  A dry sterile  dressing was applied over the incision and around the chest tubes which were  Pleur-Evac suction.  The patient remained hemodynamically stable, and was  transported to the SICU in guarded but stable condition.      Gilford Raid, M.D.  Electronically Signed      BB/MEDQ  D:  02/26/2006  T:  02/28/2006  Job:  [id number]   cc:   Quay Burow, M.D.  Cardiac Cath Lab

## 2010-10-18 NOTE — Discharge Summary (Signed)
[place]  Patient:    [name], CRITZER                        MRN: [id number] Adm. Date:  08/18/00 Disc. Date: 08/20/00 Attending:  Signa Kell, M.D. [facility] Dictator:   Gene Serpe, P.A. CC:         Deborra Medina. Lenna Gilford, M.D. [facility]   Referring Physician Discharge [place]  PROCEDURES:  Cardiac catheterization August 19, 2000.  REASON FOR ADMISSION:  Patient is a 70 year old female without prior history of heart disease who was recently referred to Dr. Dorris Carnes for a cardiac evaluation.  She was referred for diagnostic testing, including pharmacologic stress Cardiolite and a 2-D echocardiography.  Persantine Cardiolite revealed probable prior interior infarct/mild peri-infarct ischemia; EF 24%.  A 2-D echocardiogram revealed severe (4+) MR with LVD (EF 25-35%).  Patient was admitted directly for further evaluation and diagnostic coronary angiography. Please refer to dictated admission note for full details.  LABORATORY DATA:  CBC normal.  Sodium 135, potassium 3.1, glucose 138, BUN 13, creatinine 0.7 on admission - follow-up BMET pending at discharge.  Liver enzymes normal.  Cardiac enzymes:  CPK-MB negative x 2; troponin I 0.01 (x 2). Lipid profile:  Cholesterol 192, triglyceride 201, HDL 33, LDL 119.  TSH 5.0.  HOSPITAL COURSE:  Following admission patient ruled out for MI with negative serial cardiac enzymes.  Patient underwent diagnostic coronary angiography the following day by Dr. Vita Barley (see catheterization report for full details) revealing severe nonischemic cardiomyopathy with mitral regurgitation.  Specifically, there was 25% distal LMCA; long 25% proximal, 25% LAD; normal circumflex; irregular RCA. EF approximately 25% with severe MR noted.  Dr. Percival Spanish recommended follow-up TEE for assessment of the severity of the mitral regurgitation.  However, due to a full schedule this could not be performed the day following procedure.   Therefore, Dr. Harrington Challenger recommended scheduling this as an outpatient.  Additionally, she will review the findings of the transthoracic echocardiogram.  Patient did have some mild hypokalemia noted on admission - this was repleted and a follow-up BMET was pending at discharge.  Patient also had symptoms/signs suggestive of upper respiratory infection. She was placed on both steroids and antibiotics at time of discharge.  MEDICATION ADJUSTMENTS AT TIME OF ADMISSION:  Addition of aspirin, beta blocker, and Altace.  DISCHARGE MEDICATIONS: 1. Aspirin 81 mg q.d. 2. Tequin 400 mg q.d. (x 7 days). 3. Medrol Dosepak to be taken as directed, starting August 21, 2000. 4. Lopressor 25 mg b.i.d. 5. Lipitor 40 mg q.d. 6. Clonidine 10 mg q.d. 7. Estradiol 1 mg q.d. 8. Flonase nasal spray as directed. 9. Altace 2.5 mg q.d.  ACTIVITY:  Refrain from any strenuous activity, heavy lifting, or driving x 2 days.  DIET:  Maintain low fat/cholesterol and no added salt diet.  WOUND CARE:  Call the office if there is any swelling/bleeding of the groin.  FOLLOW-UP:  Patient is to follow up with Dr. Dorris Carnes in the next 1-2 weeks. Arrangements will be made through our office.  DISCHARGE DIAGNOSES: 1. Severe nonischemic cardiomyopathy.    a. Cardiac catheterization August 19, 2000.    b. Severe mitral regurgitation - 4+ by 2-D echocardiography; recommend       follow-up transesophageal echocardiogram. 2. Upper respiratory infection. 3. Tobacco. 4. Dislipidemia. 5. Left bundle-branch block. 6. Hypokalemia. DD:  08/20/00 TD:  08/20/00 Job: [id number]

## 2010-10-18 NOTE — Cardiovascular Report (Signed)
NAMEJALEN, OBERRY               ACCOUNT NO.:  [id number]   MEDICAL RECORD NO.:  [id number]          PATIENT TYPE:  INP   LOCATION:  2913                         FACILITY:  [facility]   PHYSICIAN:  Eden Lathe. Einar Gip, MD       DATE OF BIRTH:  1940/11/19   DATE OF PROCEDURE:  04/03/2006  DATE OF DISCHARGE:                              CARDIAC CATHETERIZATION   PROCEDURE PERFORMED:  1. Right heart catheterization.  2. Placement of intra-aortic balloon pump.   INDICATIONS:  Ms. [name] is a 71 year old female with nonischemic  cardiomyopathy and severe mitral regurgitation.  Has had mitral valve  replacement in the past and repeat mitral valve replacement again in  September, 2007.  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  She was in cardiogenic shock with systolic blood  pressures of 70 mmHg on 20 mcg of dopamine and 5 mg of dobutamine.  She was  brought emergently to the catheterization lab for placement of intra-aortic  balloon pump for hemodynamic stabilization.  Right heart catheterization is  being performed to evaluate her right-sided pressures and for slow  hemodynamic monitoring.   LAB DATA:  The RA pressures were 12/17 with a mean of 14 mmHg.  Giant V  waves were noted.  PA pressure was 43/22 with a mean of 31 mmHg.  Pulmonary  capillary wedge pressure was 24/24 with a mean of 21 mmHg.  Giant V waves  were noted again in the pulmonary capillary wedge tracing.  Cardiac output  was 4.8 with a cardiac index of 3.1.  Again, this was on 5 mcg of dobutamine  and 20 mcg of dopamine.  Intra-aortic balloon pump placement.  Initially,  the patient was in hypotensive shock with systolic pressures measuring  around 70 mmHg.  With wide-open dopamine and placement of intra-aortic  balloon pump, pressures did improve and the final pressures after intra-  aortic balloon insertions were 92/64 mmHg with augmented pressure of around  108-118 mmHg.   RECOMMENDATIONS:  Patient will be transferred emergently to [facility] for possible consultation for transplant and also for further  hemodynamic stabilization.   TECHNIQUE OF THE PROCEDURE:  Under micropuncture technique and local  anesthesia under sterile precautions, an 8 French intra-aortic balloon pump  sheath was introduced into the right common femoral artery.  Again, using  micropuncture technique, a 7 French right pleural venous access was  obtained.   Right heart catheterization was performed using a balloon-tipped Swan-Ganz  catheter.  The Swan-Ganz was easily advanced into the pulmonary artery, but  because of difficulty in obtained the wedge, a 0.025 inch J wire had to be  utilized for support and pulmonary capillary wedge pressure was easily  obtained.  RA pressures and PA pressures were monitored.  PA saturation was  also obtained.   TECHNIQUE OF AORTIC BALLOON PUMP INSERTION:  A balloon pump was inserted  through the arterial sheath under fluoroscopic guidance.  Excellent  augmentation was obtained.  This catheter was sutured in place.  There was  no immediate complication noted.  There  was no immediate groin.      Eden Lathe. Einar Gip, MD  Electronically Signed     JRG/MEDQ  D:  04/03/2006  T:  04/03/2006  Job:  [id number]   cc:   Quay Burow, M.D.

## 2010-10-18 NOTE — Assessment & Plan Note (Signed)
[place] OFFICE NOTE   NAME:[name], [name]                      MRN:          [id number]  DATE:09/15/2006                            DOB:          01-05-1941    [name] refers today for followup.  She is a very pleasant, middle-  aged woman with a history of congestive heart failure, mitral valve  disease status post mitral valve surgery, and a history of severe LV  dysfunction status post ICD insertion with the procedure complicated by  a very large hematoma.  The patient was treated conservatively and  returns today for followup complaining of drainage coming from her ICD  insertion site.  She denies fever or chills.  She notes that  intermittently she has noticed some yellowish discharge coming out of  her skin.   PHYSICAL EXAMINATION:  GENERAL:  She is a pleasant, well-appearing woman  in no acute distress.  VITAL SIGNS:  The blood pressure today was 105/57, the pulse 59 and  regular, respirations were 18, and the weight was 128 pounds.  NECK:  There was no jugulovenous distention.  LUNGS:  Clear bilaterally to auscultation.  CARDIOVASCULAR:  Regular rate and rhythm.  Examination of her incision  demonstrates three small areas of stitch abscess without any obvious  continuation into the pocket itself.  The pocket was without fluctuance,  and there was no obvious hematoma or tenderness there.  There was no  purulence that could be expressed though there was a bit of erythema  around the insertion site.   IMPRESSION:  1. 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  2. Cardiomyopathy.  3. Congestive heart failure.  4. Mitral valve disease status post mitral valve surgery.   DISCUSSION:  Overall, [name] is stable.  She does have a clear  stitch abscess, and I have recommended that we proceed with antibiotic  therapy for her.  I have also recommended  that she follow up for an  unscheduled PT-INR at Dr. Kennon Holter office on Friday to make sure that her  INR has not gone beyond the realm of therapeutic limits.  She will  follow up with Dr. [name] in approximately two weeks to see how her  incision is healing up.  We have given her a prescription for Keflex for  seven days.     Champ Mungo. Lovena Le, MD  Electronically Signed    GWT/MedQ  DD: 09/15/2006  DT: 09/15/2006  Job #: [id number]   cc:   Quay Burow, M.D.  Deborra Medina. Lenna Gilford, MD

## 2010-10-18 NOTE — H&P (Signed)
[facility]  Patient:    [name], [name] Visit Number: [id number] MRN: [id number]          Service Type: MED Location: [id number] Attending Physician:  Marshia Ly Dictated by:   Tammy Parrett, N.P. Admit Date:  01/29/2001                           History and Physical  DATE OF BIRTH:  [birth year]  CHIEF COMPLAINT:  Nausea, vomiting, and diarrhea.  HISTORY OF PRESENT ILLNESS:  Ms. Kitchen is a 70 year old white female patient of Deborra Medina. Lenna Gilford, M.D. who is one week status post hospitalization at [facility] from August 20 to January 22, 2001, for intractable nausea and vomiting. The patient had developed nausea and vomiting postoperatively for a mitral valve repair on December 28, 2000, by Dr. ______ at [facility]. The patient was unable to be managed outpatiently and was admitted on August 20 for an evaluation and treatment. The patient had endoscopy on January 21, 2001, by Pricilla Riffle. Dagoberto Ligas., M.D. 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 She had negative H. pylori. Also, the patient had some mild anemia with a hemoglobin at 10 and hematocrit at 29 (previously hemoglobin 12 and 34 in the office). The patient was started on a clear liquid diet and slowly advanced to full liquids and was tolerating prior to discharge. Also, the patient was switched from IV Protonix to Protonix p.o. b.i.d. However, two days status post discharge, patient states she developed loose, watery, black stools with progressive weakness and yesterday started to have vomiting x 3. She has been unable to keep down liquids or her medications, except for small sips of gingerale. She presented to the office today with the above symptoms; and on exam, she was found to have a positive occult stool. Per conversation with Dr. Sharlett Iles and GI,  the patient is to be admitted by primary care and will need a GI consultation.  PAST MEDICAL HISTORY: 1. Nonischemic cardiomyopathy with severe mitral regurgitation. She was    hospitalized by cardiology in March of 2002, which showed severe mitral    regurgitation by echocardiogram and cardiomyopathy with an ejection    fraction of 25%. Cardiac catheterization showed 25% stenosis in the LAD and    irregularities in the right coronary artery and normal circumflex. She was    then referred to [facility] for Dr. ______ who performed    a mitral valve repair on December 28, 2000. 2. History of hypercholesterolemia controlled on Lipitor. 3. History of asthmatic bronchitis, previously a former smoker. 4. Colon polyps. Most recent colonoscopy was in February of 2002 by Docia Chuck.    Geri Seminole., M.D.  CURRENT MEDICATIONS: 1. Lipitor 40 mg daily. 2. Claritin 10 mg daily. 3. Flonase 2 sprays b.i.d. 4. Estradiol 1 mg daily. 5. Metoprolol 50 mg 1/2 b.i.d. 6. Baby aspirin daily. 7. Digoxin 0.125 mg daily. 8. Protonix 40 mg b.i.d. 9. Tranxene 7.5 mg p.r.n. q.h.s.  DRUG ALLERGIES:  No known drug allergies.  FAMILY HISTORY:  Father died of MI at age 38. Mother died MI at 17. Paternal grandparent committed suicide. Maternal grandmother had a CVA.  SOCIAL HISTORY:  The patient is married. She has four children. Quit smoking eight years  ago. Denies any alcohol or drug use.  REVIEW OF SYSTEMS:  GENERAL:  The patient denies any fever. Complains of generalized weakness and fatigue. HEENT:  The patient denies any sore throat, nasal congestion, or difficulty swallowing or choking. RESPIRATORY:  The patient denies any shortness of breath or wheezing. CARDIAC:  The patient denies any chest pain, palpitations, irregular heart beat, presyncopal or syncopal episodes. ABDOMEN:  The patient complains of generalized lower abdominal cramping and aches. As above, complains of nausea, vomiting,  and diarrhea. GI:  Denies any bloody stools, however, has been black in nature. SKIN:  The patient denies any rash or joint swelling.  PHYSICAL EXAMINATION:  GENERAL:  The patient is a very pleasant, ambulatory, white patient appears somewhat pale and weak in no acute distress.  VITAL SIGNS:  Blood pressure 100/62 sitting, 88/60 standing; pulse regular at 107; weight down 1 pound at 116; temperature 97.3; O2 saturation 99% on room air.  HEENT:  Sclerae and conjunctivae clear. Oropharynx mucosa is pale. Posterior pharynx is clear. TMs are normal. Nasal mucosa is normal.  NECK:  Supple without cervical adenopathy or tenderness. Trachea is normal. No JVD. Carotids are equal with positive upstrokes bilaterally without any bruits. No thyromegaly appreciated.  LUNGS:  Clear breath sounds bilaterally without any wheezing or crackles noted.  CARDIAC:  Regular rate and rhythm with a grade 1-2 systolic murmur at the left sternal border. Incisional scars under the right breast are well healed.  ABDOMEN:  Soft, with hypoactive bowel sounds throughout. There is generalized tenderness throughout, as well, without any rebound or guarding. No abdominal bruits or masses appreciated. Negative CVA tenderness.  EXTREMITIES:  Warm without any calf tenderness, clubbing, cyanosis, or edema. Pulses are intact. Moves all extremities well.  RECTAL:  Normal sphincter tone and dark, black stool. Occult stool was positive.  SKIN:  Warm, however, somewhat pale without any joint swelling or rash.  NEUROLOGICAL:  No focal deficits detected.  IMPRESSION AND PLAN: 1. Nausea, vomiting, and diarrhea with underlying duodenal ulcer and duodenal    bulb stricture and stenosis documented by endoscopy on January 21, 2001. The    patient is unable to keep down fluids and will need admission for further    investigation to see if the ulcer is actively bleeding at this time or if    stricture needs surgical  intervention. The patient is admitted to [facility] for IV fluids and Phenergan. Labs are currently pending, including a    CBC with differential, urinalysis, CMET, PT, PTT, also patient is typed and    crossed for 2 units, currently on hold depending on hemoglobin and    hematocrit count. The patient also is to have a multiabdominal film to rule    out possible obstruction. Dr. Sharlett Iles was contacted by Deborra Medina. Lenna Gilford,    M.D. and is aware that patient is being sent to [facility] and will    consult on this patient for evaluation and management. Also, [name] T.    Hoxworth, M.D. has been contacted as well for possible management and    surgical intervention. 2. Hypercholesterolemia. The patient is to continue on Lipitor as above if    tolerated. 3. Nonischemic cardiomyopathy, status post mitral valve repair on December 28, 2000. The patient is currently stable without any cardiac complaints. EKG    is currently pending. Dictated by:   Tammy Parrett, N.P. Attending Physician:  Marshia Ly DD:  01/29/01 TD:  01/29/01 Job: [id number]

## 2010-10-18 NOTE — Discharge Summary (Signed)
[name], [name]               ACCOUNT NO.:  [id number]   MEDICAL RECORD NO.:  [id number]          PATIENT TYPE:  INP   LOCATION:  2913                         FACILITY:  [facility]   PHYSICIAN:  Eden Lathe. Einar Gip, MD       DATE OF BIRTH:  1940-09-24   DATE OF ADMISSION:  04/02/2006  DATE OF DISCHARGE:  04/03/2006                                 DISCHARGE SUMMARY   The patient is being transferred to [facility].   BRIEF HISTORY:  [name] is a 70 year old female with a history of mitral  valve ring placement for nonischemic cardiomyopathy and severe mitral  regurgitation.  This was done by Dr. Berle Mull at [facility] on December 28, 2000.  She had normal coronary arteries at that time.   She presented back again with repeated failure to thrive and repeated  episodes of congestive heart failure.  She underwent repeat cardiac  catheterization and was found to have severe mitral regurgitation and was  operated by Dr. Gilford Raid on February 26, 2006 and a [name] mitral  valve replacement was performed.  She also had two epicardial lateral wall  leads placed and tunneled simultaneously for possible future biventricular  ICD placement.   She was admitted to the hospital yesterday with black tarry stools and a  supratherapeutic INR and not feeling well.  She underwent EGD and was found  to have a very small nonbleeding gastric ulcer.  Her hemoglobin had been  stable and her INR was reversed with giving vitamin K to around 3.0.  She  had been doing well until today she became hypotensive and markedly reduced  urine output.  During this dobutamine was started and because of hypotension  she was also started on dopamine.  In spite of being on 20 mcg of dopamine  her systolic blood pressure remained in 70s.  During this she was urgently  brought to the cardiac catheterization lab this evening around 5:30 for  placement of intraaortic balloon pump and also a Swan-Ganz  catheter.   She underwent right heart catheterization and placement of intraaortic  balloon pump on an urgent basis.   In the cardiac catheterization lab her RA pressures were 12/14 with a mean  of 14 mmHg.  Giant V waves were noted.  PA pressures were 43/22 to mean of  31 mmHg.  Her pulmonary capillary wedge was 24 with giant V waves noted with  a mean of 21 mmHg.  The cardiac output was 4.8 and the cardiac index of 3.1.  Her ABGs reveal a pH of 7.37, pCO2 of 59.5, pO2 of 59 and oxygen saturation  of 89% and bicarb of 34.5.   With the balloon pump her pressures did improve with pressures of 108/82  mmHg augmented.   The patient is now being discharged to [facility] for  consideration for transplant.   Dr. Pierre Bali had a great input while evaluating the patient in the  hospital and his input was greatly appreciated.   Her medications while in the hospital were Lyrica 50 mg p.o.  t.i.d.,  metoprolol 50 mg p.o. b.i.d., Avapro 37.5 mg p.o. daily, fenofibrate 48 mg  p.o. daily, warfarin 3 mg p.o. daily which was on hold, Protonix 40 mg p.o.  daily, Estradiol 1 mg p.o. daily, Nasonex nasal spray two sprays daily,  Advair one puff b.i.d., Tranxene 3.75 mg p.o. t.i.d., Singulair 10 mg p.o.  q.h.s.   Her records will be sent over the [facility] including  her operative report and prior hospital visit.      Eden Lathe. Einar Gip, MD  Electronically Signed     JRG/MEDQ  D:  04/03/2006  T:  04/03/2006  Job:  [id number]   cc:   Quay Burow, M.D.

## 2010-10-18 NOTE — Discharge Summary (Signed)
[facility]  Patient:    [name], [name] Visit Number: [id number] MRN: [id number]          Service Type: MED Location: [id number] Attending Physician:  Marshia Ly Dictated by:   Deborra Medina. Lenna Gilford, M.D. [facility] Adm. Date:  01/19/2001 Disc. Date: 01/22/2001   CC:         Suzan Garibaldi, M.D. [place], [place]                           Discharge Summary  DATE OF BIRTH:  1940/06/06  FINAL DIAGNOSES: 1. 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 2. History of non-ischemic cardiomyopathy with severe mitral regurgitation,    status post mitral valve repair on 12/28/00 by Dr. Amador Cunas at [facility] in [place], [place]. 3. Hypercholesterolemia, on Lipitor. 4. History of asthmatic bronchitis, ex-smoker. 5. History of colon polyps, negative colonoscopy in February 2002. 6. Anemia, hemoglobin 10, hematocrit 29.  HISTORY OF PRESENT ILLNESS:  The patient is a 70 year old white female who presented to the office with intractable nausea and vomiting.  She has a history of non-ischemic cardiomyopathy, severe mitral regurgitation, and was evaluated by Dr. Harrington Challenger of the cardiology service.  She was referred to the [facility] for mitral valve repair due to severe mitral regurgitation.  This was performed by Dr. Suzan Garibaldi about one month ago.  The patient states that she had some nausea and vomiting postoperatively, but this progressed, where she noted vomiting several hours after a meal.  This progressed until she was unable to keep even liquids down satisfactorily.  Interestingly, she did not have complaints of abdominal pain, and exam revealed only minimal epigastric discomfort on palpation.  She had an outpatient ultrasound, laboratory tests, and treatment with  Phenergan. Ultrasound and labs were normal, and Phenergan helped the nausea somewhat, but she continued vomiting.  For this reason, she was admitted for IV fluids and endoscopy.  PAST MEDICAL HISTORY:  Includes a non-ischemic cardiomyopathy with severe mitral regurgitation.  She was hospitalized by the cardiology service in March 2002, results showing severe mitral regurgitation by echocardiogram, and cardiomyopathy with an ejection fraction in the 25% range.  Catheterization had revealed some nonobstructive coronary disease with 25% stenosis in the LAD, irregularities in the right coronary artery, and normal circumflex.  She was referred to [facility] where Dr. Suzan Garibaldi performed mitral valve repair.  Using the minimally invasive technique.  The patient also has a history of hypercholesterolemia, currently controlled on Lipitor.  She has a history of asthmatic bronchitis, and is a former smoker. She has been cleared without recent reactive airway problems.  She has a history of colon polyps, most recently colonoscopy, Dr. Scarlette Shorts, in February 2002.  PHYSICAL EXAMINATION:  A 70 year old lady in mild distress with weakness. Blood pressure 108/58, pulse 100 and regular, respirations 24 per minute and not labored, temperature 98 degrees, O2 saturation 95% on room air.  HEENT exam revealed her to be slightly pale, fundi were negative.  Neck showed no jugular venous distension, no carotids bruits, no thyromegaly or lymphadenopathy.  Chest exam was clear to auscultation and percussion. Cardiac exam revealed a regular rate and rhythm, grade 1 to 2/6 systolic ejection murmur over left sternal border, small incision, no rubs  or gallops heard.  There was a small incision well healed from the minimally invasive mitral valve repair that she just had.  Abdominal exam revealed hypoactive bowel sounds, but soft, mild, generalized tenderness especially in the epigastric  area, but no guarding or rebound.  There were no bruits. Extremities showed intact pulses, no clubbing, cyanosis, or edema.  Neurologic exam was intact without focal abnormalities detected.  LABORATORY DATA:  EKG showed sinus tachycardia, left bundle branch block, non-specific ST-T wave changes, no acute abnormalities suspected.  Abdominal film with chest showed some segmental atelectasis at the right lung base, otherwise, no acute changes.  The abdominal series was negative.  CT scan of chest and abdomen showed some mild hepatomegaly, mild fatty metamorphosis of the liver, and suspicion for possible early cirrhosis, possibly related to previous passive congestion.  There was a suspicion of some possible duodenal wall thickening that may represent peptic duodenitis.  CT scan of the pelvis was unremarkable.  Hemoglobin 10.7, hematocrit 30.3, repeat 10.1, hematocrit 29.  White blood cell count 7400 with a normal differential.  Prothrombin time 15.4 with an INR of 1.3, PTT 29 seconds.  Sodium 136, potassium 3.5, chloride 101, CO2 26, BUN 22, creatinine 0.9, blood sugar 118.  Calcium 9.1, total protein 7.0, albumin 3.5, AST 22, ALT 17, alkaline phosphatase 65, total bilirubin 0.6.  Digoxin level less than 0.2.  Urinalysis with trace ketones, a few bacteria.  HOSPITAL COURSE:  The patient was admitted with intractable nausea and vomiting of uncertain etiology.  The specific history of vomiting uncontrollably several hours after eating suggested possible gastroparesis or gastric outlet obstruction.  She was placed on IV fluids, given IV Protonix, and continued on her oral medication, including Lopressor, Lipitor, Lanoxin, etc.  She was given Phenergan as needed for nausea.  Abdominal films were unremarkable with no evidence of obstruction.  CT scan was performed revealing evidence of duodenal wall thickening, question of peptic duodenitis was raised.  There was some mild hepatomegaly with fatty  infiltration and question of early cirrhosis, possibly related to her previous passive congestion. Pelvic CT was negative.  The patient was seen in consultation by Dr. Fuller Plan of  the GI service.  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 pylori.  Dr. Fuller Plan recommended continued proton pump inhibitor therapy with Protonix 40 mg p.o. b.i.d., and a liquid diet, starting with clear liquids and slowing advancing to full liquids over several weeks.  After which time, the patient will be followed by GI in the office in consideration to advancing her diet to more solid foods, as the acute inflammation/edema improves in the duodenum. They will consider repeat endoscopy later depending on her clinical response.  The patient was also seen by cardiology during this hospitalization, as she had not yet been followed up post mitral valve repair.  The patient was checked by Dr. Johnsie Cancel during this hospitalization, and felt that she was stable post mitral valve repair.  The patient will continue followup in the office with Dr. Harrington Challenger.  DISCHARGE MEDICATIONS: 1. Protonix 40 mg p.o. b.i.d. 2. Lopressor 25 mg p.o. b.i.d. 3. Lipitor 40 mg p.o. q.d. 4. One buffered aspirin daily. 5. Lanoxin 0.125 mg p.o. q.d. 6. Colace 100 mg p.o. q.d. 7. Claritin 10 mg p.o. q.d. p.r.n. allergy. 8.  Tranxene 7.5 mg p.o. q.h.s. p.r.n. sleep. 9. The patient had previously been on Lasix and Micro-K which were    discontinued.  ACTIVITY:  The patient was instructed to rest at home and gradually increase activities.  FOLLOWUP: 1. Given an appointment to see Dr. Scarlette Shorts back in the office on Monday,    September 9, at 3:30 p.m. 2. She is to continue her regular followup visits with Dr. Amador Cunas and  his    team at [facility].  CONDITION ON DISCHARGE:  Improved. Dictated by:   Deborra Medina. Lenna Gilford, M.D. [place] Attending Physician:  Marshia Ly DD:  01/22/01 TD:  01/25/01 Job: [id number]

## 2010-10-18 NOTE — Consult Note (Signed)
[name], [name]               ACCOUNT NO.:  [id number]   MEDICAL RECORD NO.:  [id number]          PATIENT TYPE:  INP   LOCATION:  2312                         FACILITY:  [facility]   PHYSICIAN:  Champ Mungo. Lovena Le, MD    DATE OF BIRTH:  1941/04/08   DATE OF CONSULTATION:  02/25/2006  DATE OF DISCHARGE:                                   CONSULTATION   INDICATION FOR CONSULTATION:  Evaluation of patient prior to mitral valve  surgery for possible LV epicardial lead placement and ongoing management of  her arrhythmias.   HISTORY OF PRESENT ILLNESS:  The patient is very pleasant 70 year old woman  who has a long history of mitral valve regurgitation.  She also had mild  mitral stenosis and underwent mitral valve repair in 2002 at [facility].  Prior to surgery, she had severe LV dysfunction and a markedly  dilated hypokinetic left ventricle.  She had a long postoperative course,  apparently with a significant amount of GI problems, but eventually  improved.  Back in June of 2005, three years after her surgery, she  underwent Cardiolite at Dr. Kennon Holter office which demonstrated no ischemia  and an EF of 63%.  Her heart failure resolved, and she did well.  Her  present problem began several months ago when she noted increasingly  frequent episodes of dyspnea with exertion.  She does have rare palpitations  but has never had syncope.  She has had no documented arrhythmias that we  are aware of.  Repeat evaluation by echo back in August demonstrated severe  mitral regurgitation and a fixed posterior mitral leaflet.  Her EF had  plummeted into the 20% range.  Transesophageal echo demonstrated an EF of  20% and a peak gradient of 16 mmHg.  She had a peak diastolic dimension of  70 mm, up from 51 a year ago.  The patient notes mild peripheral edema.  She  is admitted to hospital today to undergo left to right heart catheterization  prior to proposed mitral valve surgery.  The patient  has left bundle branch  block with a QRS duration of 115 milliseconds.  She has no history of  syncope.  She denies chest pain.  She does have past medical history that is  notable for multiple GI problems including peptic ulcer disease, hiatal  hernia and what sounds like either pyloric or esophageal stenosis.  She is  status post dilatation.  She also has history of dyslipidemia.   SOCIAL HISTORY:  The patient is married and lives in [place].  She is  retired from the [employer] business.  She has a history of tobacco use and  stopped smoking in May of 2003.  She rarely drinks alcoholic beverages.   Her parents are both deceased, her mother at age 66 of MI and father age 32  of MI.   REVIEW OF SYSTEMS:  Is notable for problems with gastroesophageal reflux and  hoarseness from reflux.  She has a history of hematuria in the past.  She  has a history of duodenal ulcer and a history of  melena.  She has a history  of gastroesophageal reflux disease.  She has a history of dyspnea and  orthopnea and PND and peripheral edema.  The rest of her review of systems  were reviewed and found to be negative.   PHYSICAL EXAMINATION:  GENERAL:  She is a pleasant, well-appearing, middle-  aged woman in no acute distress.  VITAL SIGNS:  The blood pressure was 105/70, the pulse was 85 and regular,  respirations were 20, temperature was 98.  HEENT:  Normocephalic and atraumatic.  Pupils equal and round.  Oropharynx  was moist.  Sclerae anicteric.  NECK:  The carotids were 2+ and symmetric.  Trachea was midline.  There was  no thyromegaly.  LUNGS:  Clear bilaterally to auscultation except for rales in the bases.  There was no increased work of breathing.  There were no wheezes or rhonchi.  CARDIOVASCULAR EXAM:  Revealed a regular rate and rhythm with a split S2.  There was a grade 2/6 systolic murmur at the left lower sternal border and a  grade 1/6 diastolic late murmur which was low pitched.  Her PMI  was enlarged  and laterally displaced.  ABDOMEN:  The abdominal exam was soft, nontender, nondistended.  There was  no organomegaly.  Bowel sounds were present.  There was no rebound or  guarding.  EXTREMITIES:  Demonstrated trace peripheral edema bilaterally.  There was no  cyanosis or clubbing noted.  NEUROLOGIC EXAM:  Alert and oriented x3 with cranial nerves intact.  Strength was 5/5 and symmetric.   The EKG demonstrates sinus rhythm with left bundle branch block and a QRS  duration of 150 milliseconds.   IMPRESSION:  1. History of mitral regurgitation status post mitral valve repair.  2. 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  3. Palpitations of unknown etiology, though atrial fibrillation is of      concern.  4. Left bundle branch block.   DISCUSSION:  I have discussed the treatment options with the patient.  I  agree with proceeding with mitral valve repair.  While it is unlikely that  her LV function will improve because it did at the time of her additional  mitral valve surgery, it may still well do so again.  At the present time, I  have recommended that she undergo mitral valve repair/replacement in  conjunction with epicardial LV lead placement.  There is at this time no  indication to proceed immediately with ICD implantation unless the patient  has severe congestive heart failure which will not improve with medications  (class IV) following surgery, or she develops VT or VF following surgery.  A  repeat echo in 6-8 weeks following surgery will be helpful in that if her LV  function remains impaired prophylactic ICD implantation with a BiV device  would be warranted.  If her LV function is improved, then no device would be  required.           ______________________________  Champ Mungo. Lovena Le, MD     GWT/MEDQ  D:  02/25/2006  T:  02/27/2006  Job:  [id number]   cc:   Deborra Medina. Lenna Gilford, MD

## 2010-10-18 NOTE — Discharge Summary (Signed)
NAMETRAVIS, PURK               ACCOUNT NO.:  [id number]   MEDICAL RECORD NO.:  [id number]          PATIENT TYPE:  INP   LOCATION:  2016                         FACILITY:  [facility]   PHYSICIAN:  Gilford Raid, M.D.     DATE OF BIRTH:  1941/02/04   DATE OF ADMISSION:  02/25/2006  DATE OF DISCHARGE:                                 DISCHARGE SUMMARY   ADDENDUM:  This is an addendum to patient's discharge summary dictated  March 08, 2006.  Patient, at that time, was tentatively ready for  discharge home in the next 1 to 2 days.  On March 09, 2006, patient was  seen and evaluated.  Her white blood cell count had increased to 16, she had  coarse rhonchi, and complained of green sputum.  At that time, it was  thought that patient probably had bronchitis and she was started on Avalox.  Patient's sputum production decreased and her cough improved.  Chest x-ray  was obtained, which showed improving bilateral atelectasis and resolution of  pleural effusion.  Patient was continued on the Avalox.  Her white blood  cell count was rechecked and seemed to be elevated on March 11, 2006 to  17.2.  UA and culture were sent.  Again, patient remained afebrile.  On  March 12, 2006, white blood cell count increased to 18 and urinalysis  showed positive for nitrites and moderate leukocytes.  At that time, due to  patient being on Coumadin, Avalox was discontinued and she was switched over  to Levaquin for coverage of bronchitis and urinary tract infection.  Also,  during this time, patient's postop delirium worsened.  Psych was consulted  for evaluation and noted that patient had been taking Tranxene at home and  was not taking it in the hospital.  The patient was restarted on Tranxene  and began on Haldol.  Upon initiation of these 2 medication, patient's  mental status improved.  It was felt that patient was stable from a psych  standpoint and would require follow up as an outpatient.  Also, patient  had  developed oral thrush, noted March 11, 2006.  She was started on a statin,  which did start improving.   Patient is tentatively ready for discharge home on March 13, 2006.  Please  see earlier dictated discharge summary for discharge instruction.   FOLLOWUP APPOINTMENT:  Dr. Kennon Holter office will contact patient with followup  appointment in 3 weeks.  Patient will need to follow up with Dr. Gwenlyn Found in 2  weeks.  Home-health nurse will obtain PT/INR on March 16, 2006.  These  results will then be faxed to Dr. Kennon Holter office, who will manage patient's  Coumadin dosing.  Patient needs to follow up with Dr. Rhona Raider in the next  1 to 3 weeks.  She will need to contact Dr. Daylene Katayama office for this  appointment.  She also needs to contact her primary MD to schedule followup  appointment within 1 week.   DISCHARGE MEDICATIONS:  1. Tranxene 3.75 mg t.i.d. p.r.n.  2. Guaifenesin 1200 mg b.i.d.  3. Lopressor 50 mg b.i.d.  4. Atacand 4 mg daily.  5. Tricor 40 mg daily.  6. Haldol 0.5 mg b.i.d.  7. Zegerid 40 mg daily.  8. Estradiol 1 mg daily.  9. Advair 250/50 1 puff b.i.d. p.r.n.  10.Lidoderm patch, change daily.  11.Xopenex inhaler as used at home.  12.Lyrica 50 mg t.i.d.  13.Coumadin, will be dosed based on patient's discharge PT/INR results.  14.Levaquin 750 mg every 48 hours x4 days.  15.Ultram 50 mg 1 to 2 tabs q.4 to 6 hours p.r.n. pain.      Darlin Coco, [place]      Gilford Raid, M.D.  Electronically Signed    KMD/MEDQ  D:  03/12/2006  T:  03/13/2006  Job:  [id number]   cc:   Gilford Raid, M.D.  Gwenlyn Found, M.D.

## 2010-10-18 NOTE — Discharge Summary (Signed)
[facility]  Patient:    [name], [name] Visit Number: [id number] MRN: [id number]          Service Type: MED Location: [phone] Attending Physician:  Marshia Ly Dictated by:   Deborra Medina. Lenna Gilford, M.D. [facility] Admit Date:  01/29/2001 Discharge Date: 02/04/2001                             Discharge Summary  DATE OF BIRTH:  1940/11/02.  FINAL DIAGNOSES: 1. 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 2. History of nonischemic cardiomyopathy with severe mitral regurgitation,    status post mitral valve repair on December 28, 2000 by Dr. Amador Cunas in    [facility], [place], [place]. 3. Hypercholesterolemia on Lipitor. 4. History of asthmatic bronchitis, patient is an ex-smoker. 5. History of colon polyps, negative colonoscopy February 2002. 6. Anemia with gastrointestinal bleed.  BRIEF HISTORY AND PHYSICAL:  The patient is a 70 year old white female who is currently one week post hospitalization from August 20 to January 22, 2001 when she was admitted with intractable nausea and vomiting, found to be secondary to a duodenal ulcer with gastric outlet obstruction. She was treated conservatively under the direction of Dr. Fuller Plan and discharged on Protonix b.i.d. Endoscopy at that time had showed severe duodenal ulcer and stricture with a 6 mm lumen. She was placed on a liquid diet, did well during the hospital course, and was discharged tolerating liquids on medications. Two days post-discharge, she states she developed loose watery stool which turned black, became progressively weak and had the recurrence of nausea and vomiting three times on the morning of admission, January 29, 2001. She was unable to keep liquids or medications down, and was rehospitalized for progressive peptic ulcer disease with an apparent GI bleed and gastric outlet  obstruction.  PAST MEDICAL HISTORY:  She has a history of a nonischemic cardiomyopathy with severe mitral regurgitation. She was referred by cardiology to [facility] in [place], [place] for mitral valve repair which was done by Dr. Amador Cunas in July of 2002. She has a history of hypercholesterolemia controlled on Lipitor. She has a history of asthmatic bronchitis and is a former smoker. She has a history of colon polyps and had a colonoscopy in February of 2002.  PHYSICAL EXAMINATION:  Physical examination at the time of admission revealed a 70 year old white female in no acute distress. Blood pressure was 100/62 sitting, 88/60 standing, pulse was 107, weight down one pound to 116, and temperature was 97.3. O2 saturation was 99% on room air. HEENT exam was unremarkable. Chest exam was clear to percussion and auscultation. Cardiac exam revealed a regular rate and rhythm with grade 3-6/1 systolic ejection murmur on the left sternal border. There was an incisional scar under her right breast from the recent surgery. Abdomen was soft with hypoactive bowel sounds, some generalized tenderness without guarding or rebound. Rectal exam showed black stool which was heme positive. Extremities showed no cyanosis, clubbing, or edema. Neurologic exam was intact.  LABORATORY DATA:  EKG showed sinus tachycardia and nonspecific ST-T wave changes.  Abdominal film with chest showed some segmental atelectasis at the right base with elevation of right hemidiaphragm, otherwise no active disease. Abdominal films showed normal bowel gas pattern.  An upper GI with KUB on February 01, 2001 showed moderate gastroesophageal reflux and mucosal edema in  the postbulbar duodenum, no significant stenosis or ulcer crater was identified, however.  Gastric emptying scan on February 03, 2001 showed borderline gastric emptying study with just over 30% activity remaining in the stomach at 120  minutes.  The patient underwent endoscopy on January 30, 2001 by Dr. Henrene Pastor which revealed severe duodenal stricture with a 5 mm lumen and edematous tissue around the stenosis. There was an ulcer in the duodenal bulb measuring 7 mm with clean bases and friable edges.  Hemoglobin on admission was 7.8, hematocrit 22.4. She was transfused up to 10.3/29.5. White count 9600 with a normal differential. Protime 14.9, INR 1.2, PTT 26 seconds. Sodium 139, potassium 4.0, chloride 109, CO2 25, BUN 20, creatinine 0.7, blood sugar 110, calcium 9.1, total protein 6.5, albumin 3.6, AST 20, ALT 11, alk phos 64, total bilirubin 0.6, amylase 30, lipase 11, gastrin level elevated at 442, prealbumin 17.3. Urinalysis was clear. Blood type O positive. Stool for C. difficile was negative. She was transfused two units during this hospitalization.  HOSPITAL COURSE:  The patient was admitted with recurrent nausea, vomiting in the face of severe peptic ulcer disease and gastric outlet obstruction. She also had the onset of black tarry stools which were hematest positive. She was anemic with hemoglobin of 7.2. She was transfused two units of packed cells and consultation was obtained from gastroenterology and general surgery. She was placed on IV Protonix. Dr. Henrene Pastor repeated her endoscopy with similar findings showing severe gastric outlet obstruction from what appeared to be benign peptic ulcer disease. She was also seen in consultation by Dr. Excell Seltzer as it was felt that surgical intervention may be necessary. The conservative approach was taken and an upper GI was performed with the surprising result of only mild edema and not much stenosis evident on that study. She had a gastric emptying scan which showed borderline emptying with 30% retention at 120 minutes, as noted. She was placed on clear liquids and observed without signs of nausea, vomiting, or abdominal pain during the remainder of her hospital  stay.  During the majority of her hospital course, she was cared for by the gastroenterology service and was discharged on February 04, 2001 by  Nicoletta Ba, P.A. Her gastrin level was elevated, GI is aware of this, and she will be on Protonix b.i.d. at the present time.  DISCHARGE MEDICATIONS: 1. Protonix 40 mg p.o. b.i.d. 2. Reglan 5 mg p.o. t.i.d. before meals. 3. Tussionex 5 cc q.12h. p.r.n. cough. 4. Avelox ABC pack one tablet daily until gone. 5. Lopressor 25 mg p.o. b.i.d. 6. Lanoxin 0.125 mg p.o. q.d. 7. Lipitor 10 mg p.o. q.d. 8. Phenergan 25 mg as needed for nausea.  DIET:  She will continue on clear liquid, very low residue diet.  DISCHARGE FOLLOWUP:  The patient will follow up with Dr. Henrene Pastor on February 17, 2001 at 3:45 p.m.  CONDITION AT DISCHARGE:  Improved. Dictated by:   Deborra Medina. Lenna Gilford, M.D. [place] Attending Physician:  Marshia Ly DD:  03/03/01 TD:  03/03/01 Job: [id number]

## 2010-10-18 NOTE — Op Note (Signed)
NAMERASHEDA, LEDGER                         ACCOUNT NO.:  [id number]   MEDICAL RECORD NO.:  [id number]                   PATIENT TYPE:  AMB   LOCATION:  [place]                                  FACILITY:  [facility]   PHYSICIAN:  Kathalene Frames. Mayer Camel, M.D.                DATE OF BIRTH:  01-Apr-1941   DATE OF PROCEDURE:  DATE OF DISCHARGE:                                 OPERATIVE REPORT   DATE OF SURGERY:  December 20, 2003.   PREOPERATIVE DIAGNOSIS:  Left knee medial meniscal tear.   POSTOPERATIVE DIAGNOSES:  1. Left knee medial meniscal tear.  2. Chondromalacia of the patella, grade 3, and the lateral tibial condyle,     grade 3.   PROCEDURE:  1. Left knee partial arthroscopic medial meniscectomy.  2. Debridement of chondromalacia of the patella, grade 3, and lateral tibial     condyle, grade 3.   SURGEON:  Kathalene Frames. Mayer Camel, MD.   FIRST ASSISTANT:  None.   ANESTHETIC:  Local with general LMA.   ESTIMATED BLOOD LOSS:  Minimal.   FLUIDS REPLACED:  700 mL of crystalloid.   DRAINS PLACED:  None.   TOURNIQUET TIME:  None.   INDICATIONS FOR PROCEDURE:  A 70 year old woman treated for a presumed  symptomatic medial meniscal tear for many months, who has failed  conservative treatment with anti-inflammatory medicines, exercises,  temporary relief from cortisone injections, and now desires elective  arthroscopic evaluation and treatment of her left knee.   DESCRIPTION OF PROCEDURE:  The patient identified by arm band and taken to  the operating room at [facility], appropriate anesthetic  monitors were attached, and a local anesthesia with general LMA anesthesia  was then induced.  A lateral post applied to the table, and the left lower  extremity prepped and draped in the usual sterile fashion from the ankle to  the mid thigh.  Using a #11 blade, standard inferomedial and inferolateral  parapatellar portals were then made, allowing introduction of the  arthroscope through the  inferolateral portal and the outflow through the  inferomedial portal.  Pressure was kept between 50 and 70 mmHg with the  inflow pump.  Diagnostic arthroscopy revealed grade 3 chondromalacia of the  apex and lateral facet of the patella, and this was debrided back to stable  margin using a 3.5 gator sucker shaver.  Moving into the medial compartment,  the articular cartilage was in good condition, but a complex, double, parrot-  beak tear of the posterior horn was identified.  The medial limb was  actually subluxed into the notch and was removed piecemeal with the biters  and a 4.2 great white sucker shaver.  The lateral component of the double  parrot-beak was then removed with a combination of a 4.2 great white sucker  shaver and a 3.5 gator sucker shaver.  The ACL and the PCL were intact.  On  the lateral side, grade 3 chondromalacia of vertical stripe was noted from  anterior to posterior and debrided back to stable margins with a 3.5 gator  sucker shaver.  The lateral meniscus was intact and thoroughly probed.  The  gutters were cleared medially and laterally.  The arthroscope was taken  medial and lateral to the PCL, clearing the posterior compartments as well.  The knee was then irrigated out with normal saline solution, the  arthroscopic instruments removed, a dressing of Xeroform 4x4 dressings,  sponges, Webril, and an Ace wrap applied.  The patient was then awakened and  taken to the recovery room without difficulty.                                               Kathalene Frames. Mayer Camel, M.D.    Alphonsus Sias  D:  12/20/2003  T:  12/20/2003  Job:  [id number]

## 2010-10-18 NOTE — H&P (Signed)
[facility]  Patient:    [name], [name] Visit Number: [id number] MRN: [id number]          Service Type: MED Location: [id number] Attending Physician:  Marshia Ly Dictated by:   Tammy Parrett, N.P. Adm. Date:  01/19/2001                           History and Physical  DATE OF BIRTH:  1940/09/19  CHIEF COMPLAINT:  Nausea and vomiting.  HISTORY OF PRESENT ILLNESS:  [name] is a 70 year old white female, patient of Dr. Deborra Medina. Nadels, who presented to the office originally on January 04, 2001 related to persistent nausea and vomiting, status post discharge from [facility], where she was admitted from December 28, 2000 to January 02, 2001 for mitral valve repair by Dr. Viona Gilmore. Suzan Garibaldi.  Earlier in the year, patient had been found to have severe mitral regurgitation and cardiomyopathy, followed by Dr. Dorris Carnes, and was sent to Dr. Amador Cunas at [facility] for mitral valve repair.  Patient states she seemed to tolerate surgery without complication, however, has developed some nausea and vomiting that persisted after discharge; subsequently, patient came to the office on January 04, 2001 with severe nausea and vomiting and unable to keep down solid foods and tolerating minimum clear liquids.  Patient, at that time, did not have any antiemetic medications and therefore was prescribed Phenergan suppositories.  Labs were also obtained which showed an essentially unremarkable CMET and a CBC revealed an elevated WBC count at 17.7; however, exam that day was essentially unremarkable with no abdominal tenderness or rebound.  Patient denied any type of abdominal pain or fever.  Patient was checked on two to three days later and stated that her symptoms had improved and was tolerating food and fluids; however, patient returned back to the office yesterday stating that her symptoms returned and have progressively worsened over the last week, as  of yesterday was not tolerating solid foods and was vomiting approximately 45 minutes to an hour after eating, at that time was keeping down Pedialyte and ginger ale.  Yesterday, Protonix 40 mg b.i.d. was added to her regimen; in addition, labs were obtained which revealed a decreased WBC down to 12 and an unremarkable CMET, amylase and lipase.  Patient was set up for abdominal ultrasound today at [facility]. Phone report called which stated there were no gallstones and unremarkable. However, patient reports that she has been unable to keep down liquids since last night.  Patient will need hospital admission for dehydration and IV fluids.  Also, patient has a followup appointment tomorrow, January 20, 2001, with Dr. Harrington Challenger in cardiology and will need a followup consult in the hospital. Patient currently denies any abdominal pain except for aching and cramping after vomiting episodes.  Denies any hematemesis, dysuria, hematuria, flank pain, chest pain, palpitations, syncopal or presyncopal episodes, hemoptysis or shortness of breath.  Patient states she has had some mild epigastric burning, however, has been unable to take her medications due to nausea and vomiting.  Initially, two weeks ago, patient did have some loose stools and some blood with wiping which on exam had a flare-up of hemorrhoids; since then, these symptoms have totally resolved and has had normal bowel movements daily without any noted melena.  PAST MEDICAL HISTORY:  1. Nonischemic cardiomyopathy and severe mitral regurgitation, as above,     status post mitral valve repair  on December 28, 2000 by Dr. Amador Cunas at [facility] at [employer].  Please see attached discharge summary.     Patient had an echocardiogram in March of 2002 which showed LVF of     25-30% with severe MR.  Cardiac catheterization showed minimal coronary     artery disease with an LVF of 25% and severe MR.  She is followed by     Dr. Harrington Challenger in  cardiology.  2. Hypercholesterolemia, currently on Lipitor 40 mg daily.  3. History of asthmatic bronchitis, a former smoker, quit essentially eight     years ago.  4. Colon polyps, most recent colonoscopy in February of 2002 by     Dr. Docia Chuck. Geri Seminole.  CURRENT MEDICATIONS:  1. Lopressor 25 mg b.i.d.  2. Lipitor 40 mg q.h.s.  3. Tranxene 7.5 mg daily.  4. Claritin 10 mg daily.  5. Aspirin 325 mg daily.  6. Micro-K 20 mEq daily.  7. Colace 100 mg daily.  8. Digoxin 0.125 mg daily.  9. Darvocet-N 100 one to two every four to six hours p.r.n. for pain. 10. To note, patient has been on Lasix 40 mg b.i.d. and was only     supposed to be on it for two weeks status post surgery.  DRUG ALLERGIES:  No known drug allergies.  FAMILY HISTORY:  Father died of an MI at age 47.  Mother died of MI at age 36. Paternal grandparents committed suicide.  Maternal grandmother had a CVA.  SOCIAL HISTORY:  Patient is married.  She has four children.  Quit smoking eight years ago.  Denies any alcohol or drug use.  REVIEW OF SYSTEMS:  Essentially negative except as noted above.  PHYSICAL EXAMINATION:  VITAL SIGNS:  Temperature 98.0.  Blood pressure 108/59.  Pulse is regular at 100.  Respiratory rate 24.  O2 saturation 95% on room air.  GENERAL:  Patient is a very pleasant, ambulatory white female who was somewhat pale and weak, in no acute distress.  HEENT:  Conjunctivae and sclerae clear.  Oropharynx:  Somewhat pale mucosa. Without lesions.  TMs are normal.  Nasal mucosa normal.  NECK:  Supple without cervical adenopathy or tenderness.  Trachea is midline. No JVD.  Carotids are equal with positive upstrokes bilaterally without any bruits.  No thyromegaly appreciated.  LUNGS:  Lung sounds reveal clear breath sounds bilaterally without any wheezes or crackles noted.  CARDIAC:  Regular rate and rhythm with a grade 1 to 2 systolic murmur at the left sternal border.  There are several incision  scars along the anterior chest wall that are well-healed and well-approximated without any drainage.   ABDOMEN:  Soft with hypoactive bowel sounds.  There is generalized tenderness throughout with no rebound or guarding noted.  No abdominal bruits or masses appreciated.  Negative CVA tenderness.  EXTREMITIES:  Femoral pulses are intact.  Extremities are warm without any calf tenderness, cyanosis, clubbing or edema.  Pulses are intact.  SKIN:  Warm and somewhat pale without any rash.  NEUROLOGIC:  No focal deficits detected.  DATA:  Labs on January 18, 2001 revealed a WBC of 12.2 (decreased from 17.6 on January 05, 2001).  CMET revealed a blood sugar of 129, lipase at 24, amylase at 48.  Abdominal ultrasound unremarkable, negative gallstones.  IMPRESSION AND PLAN:  1. Dehydration secondary to nausea and vomiting of questionable etiology.     Will need admission for intravenous fluids, intravenous Phenergan and  intravenous proton pump inhibitor.  Patient will need further     investigation.  Currently, labs are pending, including complete blood     count with differential, urinalysis, BMET and multiple abdominal films.     Gastroenterology will be consulted for evaluation.  2. Cardiomyopathy, status post mitral valve repair on December 28, 2000.     Electrocardiogram is pending.  Patient is admitted to telemetry bed.     Cardiology -- Dr. Harrington Challenger -- is to follow patient.  Patient is to continue on     her current regimen as directed.  3. Hypercholesterolemia.  Patient is to continue on Lipitor as above. Dictated by:   Tammy Parrett, N.P. Attending Physician:  Marshia Ly DD:  01/19/01 TD:  01/19/01 Job: [id number]

## 2010-10-18 NOTE — Assessment & Plan Note (Signed)
[place] OFFICE NOTE   NAME:SHIRLEYJenine, Krisher                      MRN:          [id number]  DATE:08/05/2006                            DOB:          1940-11-01    Ms. [name] was seen today in the clinic on August 05, 2006, for a wound  check of her newly-implanted Medtronic Model number [id number] Concerto.  Date of implant was July 23, 2006, for cardiomyopathy.   On interrogation of her device today, her battery voltage was 3.20 with  a charge time of 10.1 seconds.  P-waves measured 2.9 millivolts with an  atrial capture threshold of 1 volt at 0.4 milliseconds and an atrial  lead impedance of 400 ohms.  Right ventricular R-waves measured 6.9  millivolts with a right ventricular pacing threshold of 0.5 volts at 0.4  milliseconds and a right ventricular lead impedance of 456 ohms.  Left  ventricular pacing threshold was 1 volt at 0.4 milliseconds with a left  ventricular lead impedance of 440 ohms.  Shock impedance was 52.  There  were no episodes since implant date.   No changes were made in her parameters.  Her Steri-Strips were removed  today.  There is a resolving hematoma noted.  She is going to return on  March 17 to have me recheck that and then, if everything looks okay, she  will follow up with [facility] and Vascular.  Dr. Gwenlyn Found is her  primary physician.      Alma Friendly, LPN  Electronically Signed      Deboraha Sprang, MD, [facility]  Electronically Signed   PO/MedQ  DD: 08/05/2006  DT: 08/05/2006  Job #: [id number]

## 2010-10-18 NOTE — Letter (Signed)
July 09, 2006    Quay Burow, M.D.  [phone] [place], [place] [address]   RE:  [name], [name]  MRN:  [id number]  /  DOB:  [birth year]   Dear [name],   It was a pleasure to see [name] today.  I had heard so much about  her.  It sounds like she has had a long course but is doing much better  than she had.   She has a history of mitral valve repair in 2005 that failed with a  breakdown prompting her to go for mitral valve replacement that was  accomplished by Dr. Cyndia Bent in November of this year.  At that time,  epicardial leads were placed.   She then was readmitted in congestive heart failure and shock.  She was  given an intraaortic balloon pump and transferred [facility] where Dr.  Stann Mainland and his team were able to ultimately wean her from her balloon  pump and her pressors.  She stayed there for a couple of weeks, and was  subsequently discharged.  It sounds like she developed atrial  fibrillation while there and was begun on amiodarone.   Reassessment of her ejection fraction recently following  revascularization demonstrated no significant improvement.   I should note that her coronary arteries are normal.   She currently has class III congestive failure.  She is short of breath  a lot.  She is fatigued most of the time.   Her past medical history, in addition to the above, is notable for:  1. Peptic ulcer disease and GI bleeding.  2. History of asthma.  3. DJD.  4. Treated dyslipidemia.  5. Chronic left bundle-branch block.   Her medications at present include Advair, Singular, Xopenex, Estradiol,  Flonase, Zegerid, Lasix, Carbetolol 12.5, amiodarone 200.  Her  lisinopril has been currently discontinued.  She is on Aldactone 25,  Coumadin, Simvastatin, Cozaar 100 mg daily.  She is intolerant to  ASPIRIN AND PENICILLIN, STATINS AND NIACINS.   SOCIAL HISTORY:  She is married.  She has 4 children.  She does not use  either alcohol or  recreational drugs.   On examination today, [name], her blood pressure is 105/51 with a  pulse of 62, far better than the 180 she had the other day in your  office.  Weight was 121.  HEENT exam demonstrated no icterus or  xanthoma.  The neck veins were 7-8 cm.  Carotids were brisk and full  bilaterally without bruits.  Back was without kyphosis, scoliosis.  Lungs were clear.  Heart sounds were regular with a 2/6 systolic murmur,  a broadly split S2 and a displaced PMI.  The abdomen was soft with  active bowel sounds without __________ hepatomegaly.  Femoral pulses  were 2+.  Distal pulses were intact.  There is no cyanosis, clubbing, or  edema, and the neurological exam was grossly normal.  Skin was warm and  dry.   Electrocardiogram dated today demonstrated sinus rhythm at 57 with  intervals of 0.20/0.15/0.49.  The axis was leftward and minus 30  degrees.   IMPRESSION:  1. Class III congestive heart failure.  2. Ischemic cardiomyopathy.  3. Status post mitral valve repair with failure with a mitral valve      replacement.  4. Epicardial leads x2, placed 1 posteriorly, 1 relatively anteriorly.  5. Left bundle-branch block.  6. Presumed atrial fibrillation on amiodarone.  7. Nausea questionably related to the amiodarone.   [name],  Ms. Beauchamp clearly is an appropriate candidate for ICD  implantation with left ventricular lead placement.  I have reviewed this  with her and her husband, and they are interested in proceeding.  Locations of the leads radiographically are relatively posteriorly and  anteriorly, and so I would try to place a transvenous lead on the  lateral wall.  In the event that we could not, I just clip the more  lateral lead on the chest which retracts to the more posterior lead on  her heart.   We have reviewed the potential benefits as well as potential risks  including but not limited to infection, pneumothorax, perforation,  death, and device malfunction.   They understand these risks and would  like to proceed.   [name], thanks again very much for asking [place] to see her, and I will  let you know when she comes to the hospital.    Sincerely,      Deboraha Sprang, MD, [facility]  Electronically Signed    SCK/MedQ  DD: 07/09/2006  DT: 07/09/2006  Job #: [id number]

## 2010-10-18 NOTE — Assessment & Plan Note (Signed)
[facility] OFFICE NOTE   NAME:[name], [name]                      MRN:          [id number]  DATE:04/29/2006                            DOB:          [birth year]    [name] presents today for office followup.  She is a pleasant lady  whom I have seen in the past for a history of ulcer disease with GI  bleeding.  She has had significant mitral valvular heart disease, for  which she has undergone noninvasive as well as surgical therapy.  Most  recently St. [name] mitral valve replacement in September of 2007.  She  was admitted to the hospital April 03, 2006 not feeling well.  She was  noted to be anemic with heme-positive stools and describes what sounds  like melena.  On the time of her hospital arrival, she was noted to be  markedly over-anticoagulated.  As well, she was in florid heart failure  with poor ejection fraction and associated hepatic congestion.  She was  placed on an intraaortic balloon pump and treated with digoxin and  dobutamine.  The following day, she was transferred to [facility] for possible  consideration of heart transplantation.  Fortunately, she responded to  aggressive medical therapy.  She has since been discharged and is  feeling much better.  Her coagulation indices have been monitored  carefully.  She tells me that her anemia has improved.  She presents  today because she has had some nausea.  No vomiting.  No abdominal pain.  Also some mild loose stools.  She has questions regarding her medication  and wants to make sure all is well with her stomach.  She does not take  aspirin or nonsteroidal antiinflammatory drugs.  She has no known drug  allergies.  Her current GI drug is Zegerid 40 mg daily.  No heartburn or  other problems.   OTHER MEDICATIONS:  Include Advair, Singulair, Xopenex, estradiol,  fenofibrate, Flonase, Lasix, carvedilol, amiodarone, lisinopril,  spironolactone,  and Coumadin as directed.   PHYSICAL EXAM:  Pleasant, well-appearing female in no acute distress.  Blood pressure is 100/58, heart rate 76 and regular, weight 119 pounds.  HEENT:  Sclerae are anicteric.  Conjunctivae are pink.  LUNGS:  Clear.  HEART:  Gallop with a soft systolic murmur.  ABDOMEN:  Soft, nontender, nondistended with good bowel sounds.  No  organomegaly, mass, or hernia.   IMPRESSION:  1. Recent problems with biventricular heart failure post mitral valve      replacement, now improved.  2. Minor gastrointestinal bleed in the face of marked      overanticoagulation, resolved.  3. Anemia secondary to #2.  4. History of ulcer disease, on chronic Zegerid therapy.  5. Multiple other general medical problems under the care of Dr.      Lenna Gilford.   RECOMMENDATIONS:  No change in therapy at this time.  She should remain  on a proton pump inhibitor daily, indefinitely.  As well, avoid aspirin  and nonsteroidal products.  As for her nausea, this is nonspecific and  most likely secondary to her  multiple problems.  This has improved  significantly in recent weeks as she has recooperated, and I would  anticipate her to continue along these lines.  In terms of her Coumadin  therapy, her coagulation studies are being monitored carefully, as they  should.  I see no need for any endoscopic intervention at present.  She  can return to this office on an as needed basis.     Docia Chuck. Henrene Pastor, MD  Electronically Signed    JNP/MedQ  DD: 04/29/2006  DT: 04/29/2006  Job #: [id number]   cc:   Deborra Medina. Lenna Gilford, MD  Quay Burow, M.D.  Gilford Raid, M.D.

## 2010-10-18 NOTE — Discharge Summary (Signed)
[name], [name]               ACCOUNT NO.:  [id number]   MEDICAL RECORD NO.:  [id number]          PATIENT TYPE:  INP   LOCATION:  3035                         FACILITY:  [facility]   PHYSICIAN:  Deborra Medina. Lenna Gilford, M.D. [facility] OF BIRTH:  05/26/1941   DATE OF ADMISSION:  01/01/2005  DATE OF DISCHARGE:  01/03/2005                                 DISCHARGE SUMMARY   FINAL DIAGNOSES:  1.  Admitted January 01, 2005 with refractory asthmatic bronchitis.  Patient      treated with intravenous Solu-Medrol, intravenous  Avelox, and inhaled      bronchodilators with improvement.  2.  History of nonobstructive coronary artery disease and nonischemic      cardiomyopathy with history of severe mitral valve prolapse requiring      mitral valve repair in 2002.  The patient is followed by Dr. Gwenlyn Found for      Cardiology.  Her last Cardiolite in June 2005 showed no ischemia with      ejection fraction improved to 63%.  3.  History of hyperlipidemia, treated and followed by Dr. Gwenlyn Found.  4.  History of peptic ulcer disease with a duodenal ulcer and gastric outlet      obstruction in 2002.  She has gastroesophageal reflux disease and takes      Nexium.  5.  History of colon polyps in the past with negative followup colonoscopy      in 2002.  6.  Previous history of fatty liver.  7.  History of degenerative arthritis with left knee surgery by Dr. Mayer Camel.  8.  History of low back pain with epidural steroids by Dr. Mayer Camel and Dr.      Brien Few.  9.  Status post hysterectomy in 1976.  10. History of sinusitis and allergies with allergy testing positive for      mold and ragweed in 1981.  11. History of anxiety.  12. History of visual problems with cataract surgery and LASIK surgery by      Dr. Gershon Crane.   BRIEF HISTORY AND PHYSICAL:  The patient is a 68-year white female with  multiple medical problems as listed above.  She was admitted via the office  on January 01, 2005 with refractory asthmatic bronchitis.  The  patient is an  ex-smoker, having been a heavy smoker in the past and quit several times,  last in 2004.  Her asthmatic bronchitis has been under reasonable control in  the last few years with Advair, Flonase and home nebulizer as needed.  She  developed an upper respiratory tract infection with cough, yellow sputum  production and chest congestion.  This has been unresponsive to outpatient  therapy with prednisone, Avelox and Mucinex added to her baseline regimen.  She had persistent cough and was given Endal HD, but was no better despite  additional antibiotics and Medrol therapy.  She had a home nebulizer, but  did not appear to be responding and when she presented with these refractory  symptoms, despite maximum outpatient therapy, admission was deemed necessary  for IV and inhaled therapy in the hospital setting.  She did  report  continued yellow sputum, but no hemoptysis.  She noted vague chest  discomfort and some shortness of breath as well.  It is noted that the  patient is not on an ACE inhibitor, she does have some reflux symptoms and  takes Nexium, she has history of allergies dating back to the 1980s and  sinusitis for which she takes Flonase.   PAST MEDICAL HISTORY:  History of asthmatic bronchitis and former heavy  smoker, having quit several years ago.  She has history of sinusitis in the  past and allergies with testing positive for ragweed and mold in 1981.  She  has not been on shots.  She has had cardiac history with nonobstructive  coronary disease and a nonischemic cardiomyopathy with severe mitral  regurgitation.  In 2002, she had a catheterization that showed  nonobstructive coronary disease and an EF of 25%.  She had severe mitral  regurgitation and generalized hypokinesis.  She was referred to [facility], where she had a mitral valve repair by Dr. Amador Cunas.  She has  underlying hypertension and a left bundle branch block as well.  She is  followed by Dr. Gwenlyn Found  for all her cardiac problems.  She takes SBE  prophylaxis under his direction.  Her last Cardiolite in June 2005 showed no  ischemia and ejection fraction improved to 63%.  She has hyperlipidemia and  is on 3 medicines including Lipitor, Zetia and TriCor, all followed by Dr.  Gwenlyn Found, who does her routine lab work.  She has a history of peptic ulcer  disease with a GI bleed from a duodenal ulcer and gastric outlet obstruction  requiring hospitalization in 2002 where she was attended by Dr. Henrene Pastor.  She  has gastroesophageal reflux disease as well.  Her last endoscopy was in  January 2005.  She has a history of colon polyps in the past with a negative  followup colonoscopy in January 2005.  She has had fatty liver in the past.  She has a history of arthritis with left knee surgery by Dr. Mayer Camel.  She has  a history of low back pain with epidural steroids by Dr. Mayer Camel and Dr.  Brien Few.  She had a hysterectomy in 1976.  She has a history of anxiety for  which she takes Tranxene.  She has a history of visual problems and is  attended by Dr. Gershon Crane, has done cataract surgery and LASIK surgery as  well.   PHYSICAL EXAMINATION:  Physical examination at time of admission revealed a  70 year old white female in no acute distress.  Blood pressure was 110/60,  pulse 84 and regular, respirations 20 per minute and not labored,  temperature  98 degrees.  HEENT exam unremarkable, could not see the fundi  well.  Neck exam was negative without jugular venous distension, carotid  bruits, thyromegaly or lymphadenopathy.  Chest exam revealed expiratory  wheezing and rhonchi bilaterally without consolidation findings. Cardiac  exam revealed a regular rhythm, grade 1/6 systolic ejection murmur at left  sternal border without rubs or gallops heard.  Abdomen was soft and  nontender without evidence organomegaly or masses.  Extremities showed no  cyanosis, clubbing or edema.  She has evidence of knee arthritis  and straight leg raising test was negative.  Neurologic was intact without focal  abnormalities detected.  Skin exam was negative.   LABORATORY DATA:  EKG showed normal sinus rhythm and a left bundle branch  block pattern.   Chest x-ray showed heart size and  vascularity normal, slight increased  interstitial markings compatible with some bronchitic change, no  consolidative findings or effusion.   Hemoglobin 11.8, hematocrit 35.5, white count 17,000 with 93% segs.  Sed  rate 51.  Pro time 13.1, INR at 1.0, PTT 28 seconds.  Sodium 138, potassium  3.9, chloride 105, CO2 23, BUN 8, creatinine 1.0, blood sugar 180, calcium  9.1, total protein at 6.7, albumin 3.5, AST 20, ALT 17, alk phos 68, total  bilirubin 0.4.  Beta natriuretic peptide  77.  TSH 0.57.  Sputum cultures  were negative.   HOSPITAL COURSE:  The patient was admitted with refractory asthmatic  bronchitis.  She was started IV fluids, given IV Solu-Medrol and IV Avelox.  She was given inhaled nebulizer treatment with Xopenex and Atrovent  combined.  She was continued on Mucinex, Singulair and Nexium, along with  her home meds of Lipitor, Zetia, TriCor and Cozaar.  She was given Tussionex  for cough.  She responded nicely to this regimen with improvement in her  wheezing and congestion by the second hospital day.  We were able to wean  the Solu-Medrol and the Avelox, and felt she was MHB and ready for discharge  on January 03, 2005.  Social Services helped [place] arrange for a new nebulizer  for the patient and she is being discharged on the following medicines:   DISCHARGE MEDICATIONS:  1.  prednisone 20 mg p.o. b.i.d. for 5 days and then wean slowly as      directed.  2.  Home nebulizer with Xopenex 1.25 mg three times a day.  3.  Advair 500/50 one puff b.i.d.  4.  Singulair 10  mg p.o. daily.  5.  Nexium 40 mg p.o. b.i.d.  6.  Avelox 400 p.o. daily for 5 more days until gone.  The patient is      instructed to elevate her bed  6 inches on blocks.  7.  Lipitor 40 mg p.o. daily.  8.  Zetia 10 mg p.o. daily.  9.  TriCor 145 mg p.o. daily.  10. Cozaar 50 mg p.o. daily.  11. Tranxene 7.5 mg p.o. t.i.d. as needed for nerves.   FOLLOWUP:  The patient will follow up me in the office on January 11, 2005.   CONDITION AT DISCHARGE:  Improved.       SMN/MEDQ  D:  01/03/2005  T:  01/04/2005  Job:  [id number]
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
## 2010-10-18 NOTE — Cardiovascular Report (Signed)
NAMEJAYCIE, KREGEL NO.:  [id number]   MEDICAL RECORD NO.:  [id number]          PATIENT TYPE:  INP   LOCATION:  2312                         FACILITY:  [facility]   PHYSICIAN:  Quay Burow, M.D.   DATE OF BIRTH:  [birth year]   DATE OF PROCEDURE:  02/25/2006  DATE OF DISCHARGE:                              CARDIAC CATHETERIZATION   INDICATION:  Ms. Pandey is a 70 year old married white female with history  of mitral valve ring placed by Dr. Berle Mull at [facility], December 28, 2000.  She had normal coronary arteries at that time.  Other problems include  severe MR, hypertension, and hyperlipidemia.  A 2-D echocardiogram performed  in followup revealed a decrease in LVEF down to the 25% range with moderate  to severe MR.  She was referred to Dr. Cyndia Bent by myself for consideration of  mitral valve replacement and a right and left heart cath is being performed  today to define her coronary anatomy and LV function.   DESCRIPTION OF PROCEDURE:  The patient was brought to the second floor [facility] in the postabsorptive state.  She was premedicated  with p.o. Valium.  Her right groin was prepped and draped in the usual  sterile fashion.  Xylocaine 1% was used for local anesthesia.  A 6-French  sheath was inserted into the right femoral artery using standard Seldinger  technique.  A 7-French sheath was inserted into the right femoral vein.  A 7-  French balloon-tipped thermodilution Swan-Ganz catheter was floated through  the right heart chambers, obtaining sequential pressures and blood samples  for calculation of Fick and thermodilution cardiac output.  Visipaque dye  was used for the entirety of the case.  Retrograde aortic, ventricular and pullback pressures were recorded.   HEMODYNAMIC RESULTS:  1. Aortic systolic pressure 097, diastolic pressure 64.  Left ventricle      systolic pressure 353, end-diastolic pressure 33.  2. RA pressure systolic:  A  wave 6, V wave 5, mean 3.  3. RV pressure:  Systolic 36, diastolic pressure 2.  4. PA pressure:  Systolic 34, diastolic pressure 26, mean 30.  5. Pulmonary capillary wedge pressure:  A wave 28, V wave 25, mean 23.  6. Cardiac output by Fick was 3.0 L/min with an index of 1.9 L/min per sq      m and cardiac out output by thermodilution was 3.2 L/min with an index      of 2.0 L/min per sq m.   SELECTIVE CORONARY ANGIOGRAPHY:  1. Left main normal.  2. LAD normal.  3. Left circumflex normal.  4. Right coronary was dominant and normal.   LEFT VENTRICULOGRAPHY:  RAO and LAO left ventriculographies were performed  using 25 mL of Visipaque dye at 12 mL per second.  The overall LVEF was  estimated between 20% and 25% with severe global hypokinesia.  There was 1  to 2+ angiographic MR noted.   IMPRESSION:  Ms. Wigal has normal coronary arteries and severe left  ventricular dysfunction with elevated left heart filling pressures and  decreased cardiac  output.  She is scheduled for mitral valve replacement  tomorrow.  She is a good candidate for biventricular implantable  cardioverter-defibrillator.  She is scheduled to have Dr. [name] see her  today for electrophysiologic evaluation.  She does have a conduction  abnormality.  If this is indeed the case, she would benefit from having a  lateral wall epicardial lead placed intraoperatively with subcu tunneling  for ultimate by biventricular implantable cardioverter-defibrillator  placement.   The sheaths were removed and pressures were held in the groin to achieve  hemostasis.  The patient left the lab in stable condition.      Quay Burow, M.D.  Electronically Signed     JB/MEDQ  D:  02/25/2006  T:  02/27/2006  Job:  [id number]   cc:   [facility] 2nd Floor Cardiac Cath Laboratory  [address], [place], [place] [address] [facility]. Lenna Gilford, MD  Gilford Raid, M.D.

## 2011-03-18 LAB — TSH: TSH: 8.254 — ABNORMAL HIGH

## 2011-03-18 LAB — APTT: aPTT: 33

## 2011-03-18 LAB — CBC
HCT: 29.6 — ABNORMAL LOW
HCT: 29.9 — ABNORMAL LOW
HCT: 30.8 — ABNORMAL LOW
HCT: 31.4 — ABNORMAL LOW
HCT: 32.6 — ABNORMAL LOW
Hemoglobin: 10.1 — ABNORMAL LOW
Hemoglobin: 10.1 — ABNORMAL LOW
Hemoglobin: 10.4 — ABNORMAL LOW
Hemoglobin: 10.5 — ABNORMAL LOW
Hemoglobin: 11.1 — ABNORMAL LOW
MCHC: 34
MCV: 85.8
MCV: 86.1
MCV: 86.5
Platelets: 253
Platelets: 321
RBC: 3.63 — ABNORMAL LOW
RDW: 14.6 — ABNORMAL HIGH
RDW: 14.7 — ABNORMAL HIGH
RDW: 14.9 — ABNORMAL HIGH
WBC: 10.4
WBC: 7.1
WBC: 8.8

## 2011-03-18 LAB — PROTIME-INR
INR: 1.5
INR: 1.7 — ABNORMAL HIGH
INR: 2.1 — ABNORMAL HIGH
Prothrombin Time: 18.5 — ABNORMAL HIGH
Prothrombin Time: 22.1 — ABNORMAL HIGH

## 2011-03-18 LAB — BASIC METABOLIC PANEL
Chloride: 98
GFR calc Af Amer: 37 — ABNORMAL LOW
Potassium: 4
Sodium: 133 — ABNORMAL LOW

## 2011-03-18 LAB — COMPREHENSIVE METABOLIC PANEL
Albumin: 4.3
BUN: 28 — ABNORMAL HIGH
Chloride: 100
Creatinine, Ser: 1.79 — ABNORMAL HIGH
Glucose, Bld: 90
Total Bilirubin: 0.8

## 2011-03-18 LAB — LIPID PANEL
Cholesterol: 318 — ABNORMAL HIGH
LDL Cholesterol: 234 — ABNORMAL HIGH
Total CHOL/HDL Ratio: 8.2
Triglycerides: 225 — ABNORMAL HIGH

## 2011-03-18 LAB — HEPARIN LEVEL (UNFRACTIONATED): Heparin Unfractionated: 0.52

## 2011-04-07 ENCOUNTER — Encounter (HOSPITAL_COMMUNITY): Payer: Self-pay

## 2011-04-09 MED ORDER — SODIUM CHLORIDE 0.9 % IR SOLN
Status: AC
  Filled 2011-04-09: qty 2

## 2011-04-09 MED ORDER — CEFAZOLIN SODIUM 1-5 GM-% IV SOLN: 1.0000 g | INTRAVENOUS | Status: DC

## 2011-04-09 MED ORDER — SODIUM CHLORIDE 0.9 % IV SOLN
INTRAVENOUS | Status: DC
  Administered 2011-04-10: 13:00:00 via INTRAVENOUS

## 2011-04-09 MED ORDER — SODIUM CHLORIDE 0.9 % IJ SOLN
3.0000 mL | INTRAMUSCULAR | Status: DC | PRN
  Administered 2011-04-10: 3 mL via INTRAVENOUS

## 2011-04-10 ENCOUNTER — Ambulatory Visit (HOSPITAL_COMMUNITY)
Admission: RE | Admit: 2011-04-10 | Discharge: 2011-04-10 | Disposition: A | Discharge status: Home / Self Care | Payer: Medicare Other | Source: Ambulatory Visit | Attending: Cardiovascular Disease | Admitting: Cardiovascular Disease

## 2011-04-10 ENCOUNTER — Encounter (HOSPITAL_COMMUNITY)
Admission: RE | Disposition: A | Discharge status: Home / Self Care | Payer: Self-pay | Source: Ambulatory Visit | Attending: Cardiovascular Disease

## 2011-04-10 DIAGNOSIS — Z4502 Encounter for adjustment and management of automatic implantable cardiac defibrillator: Secondary | ICD-10-CM | POA: Insufficient documentation

## 2011-04-10 DIAGNOSIS — Z95810 Presence of automatic (implantable) cardiac defibrillator: Secondary | ICD-10-CM | POA: Diagnosis present

## 2011-04-10 DIAGNOSIS — Z952 Presence of prosthetic heart valve: Secondary | ICD-10-CM

## 2011-04-10 HISTORY — PX: IMPLANTABLE CARDIOVERTER DEFIBRILLATOR (ICD) GENERATOR CHANGE: SHX5469

## 2011-04-10 LAB — SURGICAL PCR SCREEN
MRSA, PCR: NEGATIVE
Staphylococcus aureus: NEGATIVE

## 2011-04-10 LAB — PROTIME-INR
INR: 2.21 — ABNORMAL HIGH (ref 0.00–1.49)
Prothrombin Time: 24.9 seconds — ABNORMAL HIGH (ref 11.6–15.2)

## 2011-04-10 SURGERY — ICD GENERATOR CHANGE
Anesthesia: LOCAL

## 2011-04-10 MED ORDER — ACETAMINOPHEN 500 MG PO TABS: 1000.0000 mg | ORAL_TABLET | Freq: Four times a day (QID) | ORAL | Status: DC

## 2011-04-10 MED ORDER — HYDROCODONE-ACETAMINOPHEN 5-325 MG PO TABS: 1.0000 | ORAL_TABLET | ORAL | Status: DC | PRN

## 2011-04-10 MED ORDER — ONDANSETRON HCL 4 MG/2ML IJ SOLN: 4.0000 mg | Freq: Four times a day (QID) | INTRAMUSCULAR | Status: DC | PRN

## 2011-04-10 MED ORDER — ACETAMINOPHEN 325 MG PO TABS: 325.0000 mg | ORAL_TABLET | ORAL | Status: DC | PRN

## 2011-04-10 MED ORDER — FENTANYL CITRATE 0.05 MG/ML IJ SOLN
INTRAMUSCULAR | Status: AC
  Administered 2011-04-10: 16:00:00
  Filled 2011-04-10: qty 2

## 2011-04-10 MED ORDER — HEPARIN (PORCINE) IN NACL 2-0.9 UNIT/ML-% IJ SOLN
INTRAMUSCULAR | Status: AC
  Filled 2011-04-10: qty 1000

## 2011-04-10 MED ORDER — SODIUM CHLORIDE 0.9 % IV SOLN: 250.0000 mL | INTRAVENOUS | Status: DC

## 2011-04-10 MED ORDER — MUPIROCIN 2 % EX OINT
TOPICAL_OINTMENT | CUTANEOUS | Status: AC
  Administered 2011-04-10: 1 via NASAL
  Filled 2011-04-10: qty 22

## 2011-04-10 MED ORDER — LIDOCAINE HCL (PF) 1 % IJ SOLN
INTRAMUSCULAR | Status: AC
  Filled 2011-04-10: qty 60

## 2011-04-10 MED ORDER — CEFAZOLIN SODIUM 1-5 GM-% IV SOLN
INTRAVENOUS | Status: AC
  Filled 2011-04-10: qty 50

## 2011-04-10 MED ORDER — MIDAZOLAM HCL 2 MG/2ML IJ SOLN
INTRAMUSCULAR | Status: AC
  Administered 2011-04-10: 16:00:00
  Filled 2011-04-10: qty 2

## 2011-04-10 NOTE — Op Note (Signed)
Procedure report  Procedure performed:  1. CRT-D generator changeout 2. Fluoroscopy  3. Light sedation  Reason for procedure:  1. Device generator at elective replacement interval  Procedure performed by:  Sanda Klein, MD  Complications:  None  Estimated blood loss:  <5 mL  Medications administered during procedure:  Ancef 1 g intravenously, lidocaine 1% 30 mL locally, fentanyl 25 mcg intravenously, Versed 2 mg intravenously Device details:   Designer, multimedia model number [id number]. Serial number PFS [id number] Right atrial lead  (chronic) Medtronic 5076-52 cm serial number [id number] (07/23/2006) Right ventricular lead (chronic) St [name] Riata 33F single coil model 7002 serial number [id number]  (07/23/2006) Left ventricular lead (chronic) Guidant 4543 serial number [id number] (07/23/2006) Explanted generator Medtronic Concerto [id number] serial number [id number] (07/23/2006)  Procedure details:  After the risks and benefits of the procedure were discussed the patient provided informed consent. She was brought to the cardiac catheter lab in the fasting state. The patient was prepped and draped in usual sterile fashion. Local anesthesia with 1% lidocaine was administered to to the left infraclavicular area. A 7 cm horizontal incision was made parallel with and 6 cm caudal to the left clavicle, in the area of an old scar. An older scar was seen closer to the left clavicle. There was extensive fat atrophy in this area. Using minimal electrocautery and mostly sharp and blunt dissection the prepectoral pocket was opened carefully to avoid injury to multiple redundant loops of chronic leads. Extensive dissection was necessary. The device was explanted. The pocket was carefully inspected for hemostasis and flushed with copious amounts of antibiotic solution.  The leads were disconnected from the old generator and testing of the lead parameters later showed excellent values.   Due to the  perceived increased risk of infection (redo procedure, full warfarin anticoagulation, extensive subcutaneous fat atrophy an history of previous site infection),  the decision was used to place an antibiotic impregnated mesh pouch (AIGIS).   The generator and leads were then placed in the pouch. The entire system was then carefully inserted in the pocket with care been taking that the pouch and device assumed a comfortable position without pressure on the incision. Great care was taken at the leads be located deep to the generator. The pocket was then closed in layers using 2 layers of 2-0 Vicryl and cutaneous staples after which a sterile dressing was applied.   At the end of the procedure the following lead parameters were encountered:   Right atrial lead sensed P waves 3 mV, impedance 443 ohms, threshold 0.4 V at 0.7 pulse width.  Right ventricular lead sensed R waves 27.3 mV, impedance 574 ohms, threshold 0.6 V at 0.5 ms pulse width.  Left ventricular lead sensed R waves greater than 30 mV, impedance 1011 ohms, threshold 1.2 V at 0.5 ms pulse width.  The right ventricular defibrillator lead is under advisory for potential insulation breaches. The lead was therefore examined with fluoroscopy in 2 different views. No evidence of breech of insulation was noticed.

## 2011-04-10 NOTE — Op Note (Signed)
Procedure report  Procedure performed:  1. CRT-D generator changeout 2. Fluoroscopy  3. Light sedation  Reason for procedure:  1. Device generator at elective replacement interval  Procedure performed by:  Sanda Klein, MD  Complications:  None  Estimated blood loss:  <5 mL  Medications administered during procedure:  Ancef 1 g intravenously, lidocaine 1% 30 mL locally, fentanyl 25 mcg intravenously, Versed 2 mg intravenously Device details:   Designer, multimedia model number [id number]. Serial number PFS [id number] Right atrial lead  (chronic) Medtronic 5076-52 cm serial number [id number] (07/23/2006) Right ventricular lead (chronic) St [name] Riata 49F single coil model 7002 serial number [id number]  (07/23/2006) Left ventricular lead (chronic) Guidant 4543 serial number [id number] (07/23/2006) Explanted generator Medtronic Concerto [id number] serial number [id number] (07/23/2006)  Procedure details:  After the risks and benefits of the procedure were discussed the patient provided informed consent. She was brought to the cardiac catheter lab in the fasting state. The patient was prepped and draped in usual sterile fashion. Local anesthesia with 1% lidocaine was administered to to the left infraclavicular area. A 7 cm horizontal incision was made parallel with and 6 cm caudal to the left clavicle, in the area of an old scar. An older scar was seen closer to the left clavicle. There was extensive fat atrophy in this area. Using minimal electrocautery and mostly sharp and blunt dissection the prepectoral pocket was opened carefully to avoid injury to multiple redundant loops of chronic leads. Extensive dissection was necessary. The device was explanted. The pocket was carefully inspected for hemostasis and flushed with copious amounts of antibiotic solution.  The leads were disconnected from the old generator and testing of the lead parameters later showed excellent values.   Due to the  perceived increased risk of infection (redo procedure, full warfarin anticoagulation, extensive subcutaneous fat atrophy an history of previous site infection),  the decision was used to place an antibiotic impregnated mesh pouch (AIGIS).   The generator and leads were then placed in the pouch. The entire system was then carefully inserted in the pocket with care been taking that the pouch and device assumed a comfortable position without pressure on the incision. Great care was taken at the leads be located deep to the generator. The pocket was then closed in layers using 2 layers of 2-0 Vicryl and cutaneous staples after which a sterile dressing was applied.   At the end of the procedure the following lead parameters were encountered:   Right atrial lead sensed P waves 3 mV, impedance 443 ohms, threshold 0.4 V at 0.7 pulse width.  Right ventricular lead sensed R waves 27.3 mV, impedance 574 ohms, threshold 0.6 V at 0.5 ms pulse width.  Left ventricular lead sensed R waves greater than 30 mV, impedance 1011 ohms, threshold 1.2 V at 0.5 ms pulse width.  The right ventricular defibrillator lead is under advisory for potential insulation breaches. The lead was therefore examined with fluoroscopy in 2 different views. No evidence of breech of insulation was noticed.

## 2011-04-10 NOTE — Progress Notes (Signed)
Orthopedic Tech Progress Note Patient Details:  [name] [birth year] [id number]  Other Ortho Devices Ortho Device Location: applied arm sling to (L) UE   Braulio Bosch 04/10/2011, 6:08 PM

## 2011-04-10 NOTE — Progress Notes (Signed)
Patient reexamined before the procedure. No changes noted.

## 2011-04-11 ENCOUNTER — Encounter (HOSPITAL_COMMUNITY): Payer: Self-pay

## 2011-12-01 HISTORY — PX: TRANSTHORACIC ECHOCARDIOGRAM: SHX275

## 2012-02-11 ENCOUNTER — Encounter: Payer: Self-pay | Admitting: Internal Medicine

## 2012-03-12 ENCOUNTER — Other Ambulatory Visit (INDEPENDENT_AMBULATORY_CARE_PROVIDER_SITE_OTHER): Payer: Medicare Other

## 2012-03-12 ENCOUNTER — Ambulatory Visit (INDEPENDENT_AMBULATORY_CARE_PROVIDER_SITE_OTHER): Payer: Medicare Other | Admitting: Internal Medicine

## 2012-03-12 ENCOUNTER — Encounter: Payer: Self-pay | Admitting: Internal Medicine

## 2012-03-12 VITALS — BP 110/56 | HR 72 | Ht 62.25 in | Wt 136.5 lb

## 2012-03-12 DIAGNOSIS — Z8711 Personal history of peptic ulcer disease: Secondary | ICD-10-CM

## 2012-03-12 DIAGNOSIS — K219 Gastro-esophageal reflux disease without esophagitis: Secondary | ICD-10-CM

## 2012-03-12 DIAGNOSIS — R1032 Left lower quadrant pain: Secondary | ICD-10-CM

## 2012-03-12 DIAGNOSIS — Z86010 Personal history of colonic polyps: Secondary | ICD-10-CM

## 2012-03-12 LAB — BASIC METABOLIC PANEL
CO2: 30 mEq/L (ref 19–32)
Chloride: 97 mEq/L (ref 96–112)
Potassium: 4.2 mEq/L (ref 3.5–5.1)

## 2012-03-12 MED ORDER — OMEPRAZOLE 40 MG PO CPDR: 40.0000 mg | DELAYED_RELEASE_CAPSULE | Freq: Every day | ORAL | Status: DC

## 2012-03-12 NOTE — Patient Instructions (Addendum)
Your physician has requested that you go to the basement for the following lab work before leaving today: BMET  We have sent the following medications to your pharmacy for you to pick up at your convenience:  Omeprazole  You have been scheduled for a CT scan of the abdomen and pelvis at [facility] ([address]---this is in the same building as Press [facility]).   You are scheduled on 03-15-12 at 11:30. You should arrive 15 minutes prior to your appointment time for registration. Please follow the written instructions below on the day of your exam:  WARNING: IF YOU ARE ALLERGIC TO IODINE/X-RAY DYE, PLEASE NOTIFY RADIOLOGY IMMEDIATELY AT [phone]! YOU WILL BE GIVEN A 13 HOUR PREMEDICATION PREP.  1) Do not eat or drink anything after 7:30 (4 hours prior to your test) 2) You have been given 2 bottles of oral contrast to drink. The solution may taste better if refrigerated, but do NOT add ice or any other liquid to this solution. Shake well before drinking.    Drink 1 bottle of contrast @ 9:30am (2 hours prior to your exam)  Drink 1 bottle of contrast @ 10:30am (1 hour prior to your exam)  You may take any medications as prescribed with a small amount of water except for the following: Metformin, Glucophage, Glucovance, Avandamet, Riomet, Fortamet, Actoplus Met, Janumet, Glumetza or Metaglip. The above medications must be held the day of the exam AND 48 hours after the exam.  The purpose of you drinking the oral contrast is to aid in the visualization of your intestinal tract. The contrast solution may cause some diarrhea. Before your exam is started, you will be given a small amount of fluid to drink. Depending on your individual set of symptoms, you may also receive an intravenous injection of x-ray contrast/dye. Plan on being at [facility] for 30 minutes or long, depending on the type of exam you are having performed.  If you have any questions regarding your exam  or if you need to reschedule, you may call the CT department at [phone] between the hours of 8:00 am and 5:00 pm, Monday-Friday.  Please see your gynecologist in the near future regarding your left lower quadrant pain.   ________________________________________________________________________

## 2012-03-12 NOTE — Progress Notes (Signed)
HISTORY OF PRESENT ILLNESS:  Timber Marshman is a 71 y.o. female with MULTIPLE SIGNIFICANT medical problems including asthma, hyperlipidemia, hypertension, mitral valve replacement, chronic systemic anticoagulation in the form of Coumadin, congestive heart failure with implantable defibrillator, GERD,IBS, history of peptic ulcer disease with bleeding, adenomatous colon polyps, COPD, and chronic renal insufficiency. The patient was last seen in this office 08/01/2009 regarding minor rectal bleeding and GERD. See that dictation. She was to followup in one year but failed to do so. Her last upper endoscopy was in March of 2009. No mucosal abnormalities. He has had multiple colonoscopies, most recently March 2010 with a diminutive colon polyp and internal hemorrhoids noted she presents today, at the recommendation of her nephrologist, regarding chronic left lower quadrant abdominal pain. The patient describes left lower quadrant discomfort which is present "almost always". Occasionally has a sharp component. No change in bowel habits though she does fluctuate between constipation and diarrhea. Occasional urgency to defecate. No bleeding. Her appetite and weight have been stable. She is status post hysterectomy, but has her ovaries. She tells me her last GYN evaluation was 2 years ago. She cannot remember her physician's name, but states that she has at home.[name] Her left lower quadrant discomfort is not affected by meals, defecation, or change in body position. Next, she complains of active reflux symptoms. However, she has been off of PPI therapy greater than 6 months. She did not return to have the medication refilled nor does she take OTC meds. She occasionally takes her husband's Nexium. She denies dysphagia. She is accompanied by her husband. Available outside records and labs reviewed.  REVIEW OF SYSTEMS:  All non-GI ROS negative except for arthritis  Past Medical History  Diagnosis Date  . Colon polyp    adenomatous  . Hemorrhoids   . GERD (gastroesophageal reflux disease)   . Hyperlipidemia   . Bursitis   . Diverticulosis   . Anemia   . MVP (mitral valve prolapse)   . CHF (congestive heart failure)   . Arthritis   . Chronic kidney disease   . Fibromyalgia   . COPD (chronic obstructive pulmonary disease)   . IBS (irritable bowel syndrome)   . Peptic ulcer disease     Past Surgical History  Procedure Date  . Abdominal hysterectomy   . Mitral valve replacement   . Cardiac defibrillator placement 2007, 2012    x2 and pacemaker    Social History Hilliary Jock  reports that she quit smoking about 10 years ago. Her smoking use included Cigarettes. She does not have any smokeless tobacco history on file. She reports that she drinks alcohol. She reports that she does not use illicit drugs.  family history includes Breast cancer in her sister; Crohn's disease in her sister; Heart disease in her father, mother, and son; Kidney disease in her father; and Uterine cancer in her sister.  Allergies  Allergen Reactions  . Ace Inhibitors Cough  . Lipitor (Atorvastatin Calcium) Other (See Comments)    Knee pain.       PHYSICAL EXAMINATION: Vital signs: BP 110/56  Pulse 72  Ht 5' 2.25" (1.581 m)  Wt 136 lb 8 oz (61.916 kg)  BMI 24.77 kg/m2  Constitutional: generally well-appearing, no acute distress Psychiatric: alert and oriented x3, cooperative Eyes: extraocular movements intact, anicteric, conjunctiva pink Mouth: oral pharynx moist, no lesions Neck: supple no lymphadenopathy Cardiovascular: heart regular rate and rhythm, no murmur. Mechanical heart sounds Lungs: clear to auscultation bilaterally Abdomen: soft, mild tenderness in  the left lower quadrant with palpation, no mass, nondistended, no obvious ascites, no peritoneal signs, normal bowel sounds, no organomegaly Rectal:omitted Extremities: no lower extremity edema bilaterally Skin: no lesions on visible extremities Neuro:  No focal deficits.   ASSESSMENT:  #1. Vague chronic left lower quadrant discomfort of uncertain etiology. No intracolonic pathology of significance on most recent colonoscopy. Rule out non-GI cause #2. GERD. Increased symptoms off PPI #3. History of complicated peptic ulcer disease #4. History of adenomatous colon polyps. Surveillance up-to-date #5. Multiple significant medical problems   PLAN:  #1. Reflux precautions #2. Prescribe omeprazole 40 mg daily #3. CT scan of the abdomen and pelvis to further evaluate ongoing left lower quadrant pain. She may not be a candidate for IV contrast if her creatinine/creatinine clearance is unacceptable. #4. Recommended that she return to her GYN to rule out a GYN cause for the left lower quadrant pain. She agrees to schedule an appointment soon #5. Surveillance colonoscopy around March 2015

## 2012-03-15 ENCOUNTER — Ambulatory Visit (INDEPENDENT_AMBULATORY_CARE_PROVIDER_SITE_OTHER)
Admission: RE | Admit: 2012-03-15 | Discharge: 2012-03-15 | Disposition: A | Discharge status: Home / Self Care | Payer: Medicare Other | Source: Ambulatory Visit | Attending: Internal Medicine | Admitting: Internal Medicine

## 2012-03-15 DIAGNOSIS — R1032 Left lower quadrant pain: Secondary | ICD-10-CM

## 2012-03-15 DIAGNOSIS — K219 Gastro-esophageal reflux disease without esophagitis: Secondary | ICD-10-CM

## 2012-03-16 ENCOUNTER — Other Ambulatory Visit: Payer: Self-pay | Admitting: Internal Medicine

## 2012-03-16 DIAGNOSIS — Z803 Family history of malignant neoplasm of breast: Secondary | ICD-10-CM

## 2012-03-16 DIAGNOSIS — Z1231 Encounter for screening mammogram for malignant neoplasm of breast: Secondary | ICD-10-CM

## 2012-03-17 ENCOUNTER — Ambulatory Visit
Admission: RE | Admit: 2012-03-17 | Discharge: 2012-03-17 | Disposition: A | Discharge status: Home / Self Care | Payer: Medicare Other | Source: Ambulatory Visit | Attending: Internal Medicine | Admitting: Internal Medicine

## 2012-03-17 DIAGNOSIS — Z1231 Encounter for screening mammogram for malignant neoplasm of breast: Secondary | ICD-10-CM

## 2012-03-17 DIAGNOSIS — Z803 Family history of malignant neoplasm of breast: Secondary | ICD-10-CM

## 2012-06-21 ENCOUNTER — Other Ambulatory Visit: Payer: Self-pay | Admitting: Internal Medicine

## 2012-06-21 DIAGNOSIS — R222 Localized swelling, mass and lump, trunk: Secondary | ICD-10-CM

## 2012-06-22 ENCOUNTER — Ambulatory Visit
Admission: RE | Admit: 2012-06-22 | Discharge: 2012-06-22 | Disposition: A | Discharge status: Home / Self Care | Payer: Medicare Other | Source: Ambulatory Visit | Attending: Internal Medicine | Admitting: Internal Medicine

## 2012-06-22 DIAGNOSIS — R222 Localized swelling, mass and lump, trunk: Secondary | ICD-10-CM

## 2012-08-19 ENCOUNTER — Ambulatory Visit: Payer: Self-pay | Admitting: Cardiovascular Disease

## 2012-08-19 DIAGNOSIS — Z952 Presence of prosthetic heart valve: Secondary | ICD-10-CM

## 2012-08-19 DIAGNOSIS — Z7901 Long term (current) use of anticoagulants: Secondary | ICD-10-CM | POA: Insufficient documentation

## 2012-10-31 ENCOUNTER — Other Ambulatory Visit: Payer: Self-pay | Admitting: Cardiovascular Disease

## 2012-10-31 DIAGNOSIS — I428 Other cardiomyopathies: Secondary | ICD-10-CM

## 2012-10-31 LAB — ICD DEVICE OBSERVATION

## 2012-11-02 ENCOUNTER — Ambulatory Visit (INDEPENDENT_AMBULATORY_CARE_PROVIDER_SITE_OTHER): Payer: Medicare Other | Admitting: Pharmacist Clinician (PhC)/ Clinical Pharmacy Specialist

## 2012-11-02 VITALS — BP 118/56 | HR 76

## 2012-11-02 DIAGNOSIS — Z954 Presence of other heart-valve replacement: Secondary | ICD-10-CM

## 2012-11-02 DIAGNOSIS — Z952 Presence of prosthetic heart valve: Secondary | ICD-10-CM

## 2012-11-02 DIAGNOSIS — Z7901 Long term (current) use of anticoagulants: Secondary | ICD-10-CM

## 2012-11-02 LAB — POCT INR: INR: 2.5

## 2012-11-11 ENCOUNTER — Encounter: Payer: Self-pay | Admitting: *Deleted

## 2012-11-11 LAB — REMOTE ICD DEVICE
AL THRESHOLD: 0.75 V
BAMS-0001: 171 {beats}/min
BATTERY VOLTAGE: 3.11 V
HV IMPEDENCE: 74 Ohm
LV LEAD IMPEDENCE ICD: 475 Ohm
RV LEAD AMPLITUDE: 20 mv
RV LEAD IMPEDENCE ICD: 532 Ohm
RV LEAD THRESHOLD: 1.125 V
TZAT-0004SLOWVT: 8
TZAT-0004SLOWVT: 8
TZAT-0011SLOWVT: 10 ms
TZAT-0011SLOWVT: 10 ms
TZAT-0018SLOWVT: NEGATIVE
TZAT-0018SLOWVT: NEGATIVE
TZON-0003VSLOWVT: 451.1 ms
TZON-0004VSLOWVT: 20
TZON-0005SLOWVT: 12
TZST-0001SLOWVT: 3
TZST-0001SLOWVT: 5
TZST-0003SLOWVT: 35 J
VENTRICULAR PACING ICD: 100 pct
VF: 0

## 2012-11-15 ENCOUNTER — Other Ambulatory Visit: Payer: Self-pay | Admitting: Internal Medicine

## 2012-11-15 ENCOUNTER — Other Ambulatory Visit: Payer: Self-pay | Admitting: Cardiovascular Disease

## 2012-11-18 ENCOUNTER — Other Ambulatory Visit: Payer: Self-pay | Admitting: Internal Medicine

## 2012-11-29 ENCOUNTER — Telehealth (HOSPITAL_COMMUNITY): Payer: Self-pay | Admitting: Cardiovascular Disease

## 2012-11-29 ENCOUNTER — Other Ambulatory Visit (HOSPITAL_COMMUNITY): Payer: Self-pay | Admitting: Cardiovascular Disease

## 2012-11-29 DIAGNOSIS — I340 Nonrheumatic mitral (valve) insufficiency: Secondary | ICD-10-CM

## 2012-11-29 NOTE — Telephone Encounter (Signed)
LEFT MESSAGE TO CALL BACK.[name] PT NEEDS TESTING PER JB

## 2012-11-30 ENCOUNTER — Ambulatory Visit (INDEPENDENT_AMBULATORY_CARE_PROVIDER_SITE_OTHER): Payer: Medicare Other | Admitting: Pharmacist Clinician (PhC)/ Clinical Pharmacy Specialist

## 2012-11-30 VITALS — BP 120/56 | HR 76

## 2012-11-30 DIAGNOSIS — Z954 Presence of other heart-valve replacement: Secondary | ICD-10-CM

## 2012-11-30 DIAGNOSIS — Z7901 Long term (current) use of anticoagulants: Secondary | ICD-10-CM

## 2012-11-30 DIAGNOSIS — Z952 Presence of prosthetic heart valve: Secondary | ICD-10-CM

## 2012-11-30 LAB — POCT INR: INR: 2.5

## 2012-12-06 ENCOUNTER — Ambulatory Visit (HOSPITAL_COMMUNITY)
Admission: RE | Admit: 2012-12-06 | Discharge: 2012-12-06 | Disposition: A | Discharge status: Home / Self Care | Payer: Medicare Other | Source: Ambulatory Visit | Attending: Cardiology | Admitting: Cardiology

## 2012-12-06 DIAGNOSIS — I079 Rheumatic tricuspid valve disease, unspecified: Secondary | ICD-10-CM | POA: Insufficient documentation

## 2012-12-06 DIAGNOSIS — K219 Gastro-esophageal reflux disease without esophagitis: Secondary | ICD-10-CM | POA: Insufficient documentation

## 2012-12-06 DIAGNOSIS — I059 Rheumatic mitral valve disease, unspecified: Secondary | ICD-10-CM

## 2012-12-06 DIAGNOSIS — I509 Heart failure, unspecified: Secondary | ICD-10-CM | POA: Insufficient documentation

## 2012-12-06 DIAGNOSIS — I340 Nonrheumatic mitral (valve) insufficiency: Secondary | ICD-10-CM

## 2012-12-06 DIAGNOSIS — Z954 Presence of other heart-valve replacement: Secondary | ICD-10-CM | POA: Insufficient documentation

## 2012-12-06 NOTE — Progress Notes (Signed)
2D Echo Performed 12/06/2012    Yael Angerer, RCS  

## 2012-12-08 ENCOUNTER — Encounter: Payer: Self-pay | Admitting: Cardiovascular Disease

## 2012-12-22 ENCOUNTER — Ambulatory Visit (INDEPENDENT_AMBULATORY_CARE_PROVIDER_SITE_OTHER): Payer: Medicare Other | Admitting: Pharmacist Clinician (PhC)/ Clinical Pharmacy Specialist

## 2012-12-22 VITALS — BP 104/58 | HR 80

## 2012-12-22 DIAGNOSIS — Z952 Presence of prosthetic heart valve: Secondary | ICD-10-CM

## 2012-12-22 DIAGNOSIS — Z7901 Long term (current) use of anticoagulants: Secondary | ICD-10-CM

## 2012-12-22 DIAGNOSIS — Z954 Presence of other heart-valve replacement: Secondary | ICD-10-CM

## 2012-12-22 LAB — POCT INR: INR: 3.2

## 2013-01-02 ENCOUNTER — Other Ambulatory Visit: Payer: Self-pay | Admitting: Cardiovascular Disease

## 2013-01-02 DIAGNOSIS — I428 Other cardiomyopathies: Secondary | ICD-10-CM

## 2013-01-02 LAB — ICD DEVICE OBSERVATION

## 2013-01-10 LAB — REMOTE ICD DEVICE
AL AMPLITUDE: 2.3 mv
AL IMPEDENCE ICD: 418 Ohm
LV LEAD IMPEDENCE ICD: 532 Ohm
MODE SWITCH EPISODES: 0
RV LEAD AMPLITUDE: 20 mv
TZAT-0004SLOWVT: 8
TZAT-0013SLOWVT: 2
TZAT-0018SLOWVT: NEGATIVE
TZON-0003AFLUTTER: 350.8 ms
TZON-0003ATACH: 350.8 ms
TZON-0003VSLOWVT: 451.1 ms
TZON-0004SLOWVT: 16
TZON-0005SLOWVT: 12
TZST-0001SLOWVT: 5
TZST-0003SLOWVT: 35 J
TZST-0003SLOWVT: 35 J
VENTRICULAR PACING ICD: 99.9 pct

## 2013-01-12 ENCOUNTER — Ambulatory Visit: Payer: Medicare Other | Admitting: Cardiology

## 2013-01-19 ENCOUNTER — Encounter: Payer: Self-pay | Admitting: Cardiovascular Disease

## 2013-01-19 ENCOUNTER — Ambulatory Visit: Payer: Medicare Other | Admitting: Pharmacist Clinician (PhC)/ Clinical Pharmacy Specialist

## 2013-01-19 ENCOUNTER — Ambulatory Visit (INDEPENDENT_AMBULATORY_CARE_PROVIDER_SITE_OTHER): Payer: Medicare Other | Admitting: Cardiovascular Disease

## 2013-01-19 ENCOUNTER — Ambulatory Visit (INDEPENDENT_AMBULATORY_CARE_PROVIDER_SITE_OTHER): Payer: Medicare Other | Admitting: Pharmacist Clinician (PhC)/ Clinical Pharmacy Specialist

## 2013-01-19 VITALS — BP 114/60 | HR 66 | Resp 20 | Ht 62.0 in | Wt 131.4 lb

## 2013-01-19 DIAGNOSIS — Z952 Presence of prosthetic heart valve: Secondary | ICD-10-CM

## 2013-01-19 DIAGNOSIS — I4891 Unspecified atrial fibrillation: Secondary | ICD-10-CM

## 2013-01-19 DIAGNOSIS — Z95810 Presence of automatic (implantable) cardiac defibrillator: Secondary | ICD-10-CM

## 2013-01-19 DIAGNOSIS — Z7901 Long term (current) use of anticoagulants: Secondary | ICD-10-CM

## 2013-01-19 DIAGNOSIS — Z954 Presence of other heart-valve replacement: Secondary | ICD-10-CM

## 2013-01-19 NOTE — Patient Instructions (Addendum)
Your physician recommends that you schedule a follow-up appointment in: 12 months.

## 2013-01-30 ENCOUNTER — Other Ambulatory Visit: Payer: Self-pay | Admitting: Cardiovascular Disease

## 2013-02-01 NOTE — Telephone Encounter (Signed)
Rx was sent to pharmacy electronically. 

## 2013-02-06 ENCOUNTER — Other Ambulatory Visit: Payer: Self-pay | Admitting: Cardiovascular Disease

## 2013-02-06 DIAGNOSIS — I2589 Other forms of chronic ischemic heart disease: Secondary | ICD-10-CM

## 2013-02-06 DIAGNOSIS — I509 Heart failure, unspecified: Secondary | ICD-10-CM

## 2013-02-07 NOTE — Assessment & Plan Note (Signed)
INR today 3.3

## 2013-02-07 NOTE — Assessment & Plan Note (Signed)
Echo July 7 shows normal prosthetic valve gradients and remarkable improvement in LVEF which is now 55-60%

## 2013-02-07 NOTE — Assessment & Plan Note (Signed)
She has a dual-chamber biventricular pacemaker/ICD initially implanted in 2008 ([place] 'ICD lead fluoroscopically normal at time of generator change). Normal device check via [facility]lin on 01/02/13. 100% biV pacing efficiency, 95% atrial pacing, no atrial or ventricular arrhythmia, all the lead parameters within normal range. Optivol close to individual baseline

## 2013-02-07 NOTE — Progress Notes (Signed)
Patient ID: [name], female   DOB: Dec 08, 1940, 72 y.o.   MRN: [id number]    Reason for office visit CRT-D followup, CHF, MVR  [name] has a long-standing history of valvular heart disease, undergoing mitral valve repair in 2002, followed by a mitral valve replacement with a mechanical prosthesis in 2007. Left ventricular ejection fraction which has been as low as 10% improve substantially after implantation of the CRT-D device and now her LVEF is normal. She received her initial CRT-D in 2008 and underwent a generator change out in 2012. She has not received VT therapy. She has 100% biventricular pacing efficiency. She has a Riata 7 [place] ICD lead under advisory, which appeared fluoroscopically normal at the time of for generator change. Did not have any angiographic CAD prior to her surgery.  Her only complaint today is that of occasional dizziness. She hashad a few mild falls/stumbles which she blames on week joints. She has not experienced syncope. Does not have any symptoms of congestive heart failure.   Allergies  Allergen Reactions  . Ace Inhibitors Cough  . Lipitor [Atorvastatin Calcium] Other (See Comments)    Knee pain.    Current Outpatient Prescriptions  Medication Sig Dispense Refill  . acetaminophen (TYLENOL) 500 MG tablet Take 500 mg by mouth daily as needed. For pain.       [name] albuterol (PROVENTIL HFA;VENTOLIN HFA) 108 (90 BASE) MCG/ACT inhaler Inhale 2 puffs into the lungs every 6 (six) hours as needed. For shortness of breath.       . allopurinol (ZYLOPRIM) 300 MG tablet Take 300 mg by mouth daily.      [name] ALPRAZolam (XANAX) 0.25 MG tablet Take 0.5 mg by mouth at bedtime as needed. For sleep.       [name] amiodarone (PACERONE) 200 MG tablet Take 100 mg by mouth daily.        . CRESTOR 5 MG tablet Take 5 mg by mouth daily.      [name] DIOVAN 80 MG tablet TAKE 1 TABLET BY MOUTH DAILY  30 tablet  6  . ezetimibe (ZETIA) 10 MG tablet Take 10 mg by mouth daily.        .  fenofibrate 54 MG tablet Take 54 mg by mouth daily.        . Fluticasone-Salmeterol (ADVAIR) 250-50 MCG/DOSE AEPB Inhale 1 puff into the lungs every 12 (twelve) hours.        . metoprolol tartrate (LOPRESSOR) 25 MG tablet Take 25 mg by mouth 2 (two) times daily.        . Multiple Vitamins-Minerals (MULTIVITAMINS THER. W/MINERALS) TABS Take 1 tablet by mouth daily.        [name] omeprazole (PRILOSEC) 40 MG capsule TAKE ONE CAPSULE BY MOUTH EVERY DAY  30 capsule  6  . sertraline (ZOLOFT) 25 MG tablet Take 50 mg by mouth every morning.       [name] spironolactone (ALDACTONE) 25 MG tablet Take 25 mg by mouth daily.       . Vitamin D, Ergocalciferol, (DRISDOL) 50000 UNITS CAPS Take 50,000 Units by mouth once a week. On thrusdays.       [name] warfarin (COUMADIN) 2.5 MG tablet Take 2.5 mg by mouth daily.        . furosemide (LASIX) 40 MG tablet TAKE 2 TABLETS BY MOUTH EVERY MORNING AND 1 TAB EVERY AFTERNOON  90 tablet  6   No current facility-administered medications for this visit.    Past Medical History  Diagnosis  Date  . Colon polyp     adenomatous  . Hemorrhoids   . GERD (gastroesophageal reflux disease)   . Hyperlipidemia   . Bursitis   . Diverticulosis   . Anemia   . MVP (mitral valve prolapse)   . CHF (congestive heart failure)   . Arthritis   . Chronic kidney disease   . Fibromyalgia   . COPD (chronic obstructive pulmonary disease)   . IBS (irritable bowel syndrome)   . Peptic ulcer disease     Past Surgical History  Procedure Laterality Date  . Abdominal hysterectomy    . Mitral valve replacement    . Cardiac defibrillator placement  2007, 2012    x2 and pacemaker    Family History  Problem Relation Age of Onset  . Breast cancer Sister     multiple  . Uterine cancer Sister     multiple  . Heart disease Mother   . Heart disease Father   . Crohn's disease Sister   . Heart disease Son   . Kidney disease Father     History   Social History  . Marital Status: Married     Spouse Name: N/A    Number of Children: 4  . Years of Education: N/A   Occupational History  . retired    Social History Main Topics  . Smoking status: Former Smoker    Types: Cigarettes    Quit date: 06/02/2001  . Smokeless tobacco: Not on file  . Alcohol Use: Yes     Comment: social-1 every 6 months  . Drug Use: No  . Sexual Activity: Not on file   Other Topics Concern  . Not on file   Social History Narrative  . No narrative on file    Review of systems: The patient specifically denies any chest pain at rest or with exertion, dyspnea at rest or with exertion, orthopnea, paroxysmal nocturnal dyspnea, syncope, palpitations, focal neurological deficits, intermittent claudication, lower extremity edema, unexplained weight gain, cough, hemoptysis or wheezing.  The patient also denies abdominal pain, nausea, vomiting, dysphagia, diarrhea, constipation, polyuria, polydipsia, dysuria, hematuria, frequency, urgency, abnormal bleeding or bruising, fever, chills, unexpected weight changes, mood swings, change in skin or hair texture, change in voice quality, auditory or visual problems, allergic reactions or rashes, new musculoskeletal complaints other than usual "aches and pains".   PHYSICAL EXAM BP 114/60  Pulse 66  Resp 20  Ht 5' 2"  (1.575 m)  Wt 131 lb 6.4 oz (59.603 kg)  BMI 24.03 kg/m2  General: Alert, oriented x3, no distress Head: no evidence of trauma, PERRL, EOMI, no exophtalmos or lid lag, no myxedema, no xanthelasma; normal ears, nose and oropharynx Neck: normal jugular venous pulsations and no hepatojugular reflux; brisk carotid pulses without delay and no carotid bruits Chest: clear to auscultation, no signs of consolidation by percussion or palpation, normal fremitus, symmetrical and full respiratory excursions; healthy subclavian defibrillator site Cardiovascular: normal position and quality of the apical impulse, regular rhythm, normal first and second heart sounds,  very crisp prosthetic valve clicks, no murmurs, rubs or gallops Abdomen: no tenderness or distention, no masses by palpation, no abnormal pulsatility or arterial bruits, normal bowel sounds, no hepatosplenomegaly Extremities: no clubbing, cyanosis or edema; 2+ radial, ulnar and brachial pulses bilaterally; 2+ right femoral, posterior tibial and dorsalis pedis pulses; 2+ left femoral, posterior tibial and dorsalis pedis pulses; no subclavian or femoral bruits Neurological: grossly nonfocal   EKG: Atrial ventricular sequential pacing  ASSESSMENT AND PLAN  IMPLANTATION OF DEFIBRILLATOR, HX OF She has a dual-chamber biventricular pacemaker/ICD initially implanted in 2008 ([place] 'ICD lead fluoroscopically normal at time of generator change). Normal device check via [facility]lin on 01/02/13. 100% biV pacing efficiency, 95% atrial pacing, no atrial or ventricular arrhythmia, all the lead parameters within normal range. Optivol close to individual baseline  Long term (current) use of anticoagulants INR today 3.3  History of mitral valve replacement with mechanical valve Echo July 7 shows normal prosthetic valve gradients and remarkable improvement in LVEF which is now 55-60%  She has been on amiodarone therapy at least since her last surgery. We have not detected any meaningful episodes of atrial fibrillation or ventricular tachycardia. Consider discontinuation of amiodarone since it has many potential side effects, specially since improved left ventricular systolic function makes arrhythmia recurrence less. Discuss with Dr. Gwenlyn Found Orders Placed This Encounter  Procedures  . EKG 12-Lead   Meds ordered this encounter  Medications  . CRESTOR 5 MG tablet    Sig: Take 5 mg by mouth daily.    Holli Humbles, MD, [place] [facility] [place] [phone] office [phone] pager

## 2013-02-11 ENCOUNTER — Other Ambulatory Visit: Payer: Self-pay

## 2013-02-11 DIAGNOSIS — Z1231 Encounter for screening mammogram for malignant neoplasm of breast: Secondary | ICD-10-CM

## 2013-02-15 ENCOUNTER — Encounter: Payer: Self-pay | Admitting: *Deleted

## 2013-02-15 LAB — REMOTE ICD DEVICE
AL THRESHOLD: 0.75 V
ATRIAL PACING ICD: 98.4 pct
BATTERY VOLTAGE: 3.11 V
FVT: 0
HV IMPEDENCE: 96 Ohm
LV LEAD THRESHOLD: 0.875 V
PACEART VT: 0
RV LEAD IMPEDENCE ICD: 589 Ohm
RV LEAD THRESHOLD: 1.25 V
TZAT-0001SLOWVT: 1
TZAT-0001SLOWVT: 2
TZAT-0004SLOWVT: 8
TZAT-0011SLOWVT: 10 ms
TZAT-0011SLOWVT: 10 ms
TZAT-0013SLOWVT: 2
TZAT-0013SLOWVT: 2
TZON-0004VSLOWVT: 20
TZST-0001SLOWVT: 3
TZST-0003SLOWVT: 25 J
TZST-0003SLOWVT: 35 J
VF: 0

## 2013-02-16 ENCOUNTER — Ambulatory Visit (INDEPENDENT_AMBULATORY_CARE_PROVIDER_SITE_OTHER): Payer: Medicare Other | Admitting: Pharmacist Clinician (PhC)/ Clinical Pharmacy Specialist

## 2013-02-16 VITALS — BP 110/56 | HR 80

## 2013-02-16 DIAGNOSIS — Z954 Presence of other heart-valve replacement: Secondary | ICD-10-CM

## 2013-02-16 DIAGNOSIS — Z7901 Long term (current) use of anticoagulants: Secondary | ICD-10-CM

## 2013-02-16 DIAGNOSIS — Z952 Presence of prosthetic heart valve: Secondary | ICD-10-CM

## 2013-02-16 LAB — REMOTE ICD DEVICE
AL AMPLITUDE: 2.1 mv
AL IMPEDENCE ICD: 418 Ohm
AL THRESHOLD: 0.75 v
ATRIAL PACING ICD: 98.4 pct
BAMS-0001: 171 {beats}/min
BATTERY VOLTAGE: 3.11 v
CHARGE TIME: 9.1 s
FVT: 0
HV IMPEDENCE: 96 Ohm
LV LEAD IMPEDENCE ICD: 513 Ohm
LV LEAD THRESHOLD: 0.875 v
MODE SWITCH EPISODES: 0
PACEART VT: 0
RV LEAD AMPLITUDE: 20 mv
RV LEAD IMPEDENCE ICD: 589 Ohm
RV LEAD THRESHOLD: 1.25 v
TZAT-0001SLOWVT: 1
TZAT-0001SLOWVT: 2
TZAT-0004SLOWVT: 8
TZAT-0004SLOWVT: 8
TZAT-0011SLOWVT: 10 ms
TZAT-0011SLOWVT: 10 ms
TZAT-0013SLOWVT: 2
TZAT-0013SLOWVT: 2
TZAT-0018SLOWVT: NEGATIVE
TZAT-0018SLOWVT: NEGATIVE
TZON-0003AFLUTTER: 350.8 ms
TZON-0003ATACH: 350.8 ms
TZON-0003SLOWVT: 340.9 ms
TZON-0003VSLOWVT: 451.1 ms
TZON-0004SLOWVT: 16
TZON-0004VSLOWVT: 20
TZON-0005SLOWVT: 12
TZST-0001SLOWVT: 3
TZST-0001SLOWVT: 4
TZST-0001SLOWVT: 5
TZST-0001SLOWVT: 6
TZST-0003SLOWVT: 25 J
TZST-0003SLOWVT: 35 J
TZST-0003SLOWVT: 35 J
TZST-0003SLOWVT: 35 J
VENTRICULAR PACING ICD: 99.9 pct
VF: 0

## 2013-03-11 ENCOUNTER — Ambulatory Visit (INDEPENDENT_AMBULATORY_CARE_PROVIDER_SITE_OTHER): Payer: Medicare Other | Admitting: Pharmacist Clinician (PhC)/ Clinical Pharmacy Specialist

## 2013-03-11 VITALS — BP 120/60 | HR 80

## 2013-03-11 DIAGNOSIS — Z954 Presence of other heart-valve replacement: Secondary | ICD-10-CM

## 2013-03-11 DIAGNOSIS — Z952 Presence of prosthetic heart valve: Secondary | ICD-10-CM

## 2013-03-11 DIAGNOSIS — Z7901 Long term (current) use of anticoagulants: Secondary | ICD-10-CM

## 2013-03-11 LAB — POCT INR: INR: 3

## 2013-03-14 ENCOUNTER — Ambulatory Visit (INDEPENDENT_AMBULATORY_CARE_PROVIDER_SITE_OTHER): Payer: Medicare Other

## 2013-03-14 DIAGNOSIS — I509 Heart failure, unspecified: Secondary | ICD-10-CM

## 2013-03-15 ENCOUNTER — Encounter: Payer: Self-pay | Admitting: *Deleted

## 2013-03-16 ENCOUNTER — Ambulatory Visit: Payer: Medicare Other | Admitting: Pharmacist Clinician (PhC)/ Clinical Pharmacy Specialist

## 2013-03-17 LAB — REMOTE ICD DEVICE
AL AMPLITUDE: 2.1 mv
AL IMPEDENCE ICD: 418 Ohm
AL THRESHOLD: 0.75 V
BAMS-0001: 171 {beats}/min
BATTERY VOLTAGE: 3.11 V
LV LEAD IMPEDENCE ICD: 513 Ohm
LV LEAD THRESHOLD: 1 V
RV LEAD AMPLITUDE: 20 mv
RV LEAD IMPEDENCE ICD: 551 Ohm
TZAT-0004SLOWVT: 8
TZAT-0004SLOWVT: 8
TZAT-0011SLOWVT: 10 ms
TZAT-0013SLOWVT: 2
TZAT-0018SLOWVT: NEGATIVE
TZAT-0018SLOWVT: NEGATIVE
TZON-0003AFLUTTER: 350.8 ms
TZON-0003VSLOWVT: 451.1 ms
TZON-0004VSLOWVT: 20
TZON-0005SLOWVT: 12
TZST-0001SLOWVT: 3
TZST-0001SLOWVT: 5
TZST-0003SLOWVT: 35 J
TZST-0003SLOWVT: 35 J
VENTRICULAR PACING ICD: 99.9 pct
VF: 0

## 2013-03-22 ENCOUNTER — Ambulatory Visit
Admission: RE | Admit: 2013-03-22 | Discharge: 2013-03-22 | Disposition: A | Discharge status: Home / Self Care | Payer: Medicare Other | Source: Ambulatory Visit

## 2013-03-22 DIAGNOSIS — Z1231 Encounter for screening mammogram for malignant neoplasm of breast: Secondary | ICD-10-CM

## 2013-04-12 ENCOUNTER — Ambulatory Visit (INDEPENDENT_AMBULATORY_CARE_PROVIDER_SITE_OTHER): Payer: Medicare Other | Admitting: Pharmacist Clinician (PhC)/ Clinical Pharmacy Specialist

## 2013-04-12 VITALS — BP 118/56 | HR 76

## 2013-04-12 DIAGNOSIS — Z954 Presence of other heart-valve replacement: Secondary | ICD-10-CM

## 2013-04-12 DIAGNOSIS — Z952 Presence of prosthetic heart valve: Secondary | ICD-10-CM

## 2013-04-12 DIAGNOSIS — Z7901 Long term (current) use of anticoagulants: Secondary | ICD-10-CM

## 2013-04-12 LAB — POCT INR
INR: 3.5
INR: 3.5

## 2013-04-17 LAB — ICD DEVICE OBSERVATION

## 2013-04-18 ENCOUNTER — Ambulatory Visit (INDEPENDENT_AMBULATORY_CARE_PROVIDER_SITE_OTHER): Payer: Medicare Other

## 2013-04-18 DIAGNOSIS — I509 Heart failure, unspecified: Secondary | ICD-10-CM

## 2013-04-26 ENCOUNTER — Encounter: Payer: Self-pay | Admitting: *Deleted

## 2013-04-26 LAB — MDC_IDC_ENUM_SESS_TYPE_REMOTE
Battery Voltage: 3.08 V
Brady Statistic AP VP Percent: 99.9 %
Brady Statistic AP VS Percent: 0.1 % — CL
Brady Statistic AS VP Percent: 0.1 % — CL
Brady Statistic AS VS Percent: 0.1 % — CL
HighPow Impedance: 75 Ohm
Lead Channel Impedance Value: 418 Ohm
Lead Channel Impedance Value: 589 Ohm
Lead Channel Pacing Threshold Amplitude: 0.625 V
Lead Channel Pacing Threshold Amplitude: 1.125 V
Lead Channel Pacing Threshold Amplitude: 1.125 V
Lead Channel Pacing Threshold Pulse Width: 0.4 ms
Lead Channel Pacing Threshold Pulse Width: 0.4 ms
Lead Channel Sensing Intrinsic Amplitude: 2.6 mV
Lead Channel Sensing Intrinsic Amplitude: 20 mV
Lead Channel Setting Pacing Amplitude: 1.75 V
Lead Channel Setting Pacing Pulse Width: 0.4 ms
Lead Channel Setting Sensing Sensitivity: 0.3 mV
Zone Setting Detection Interval: 340.9 ms
Zone Setting Detection Interval: 350.8 ms

## 2013-05-10 ENCOUNTER — Ambulatory Visit (INDEPENDENT_AMBULATORY_CARE_PROVIDER_SITE_OTHER): Payer: Medicare Other | Admitting: Pharmacist Clinician (PhC)/ Clinical Pharmacy Specialist

## 2013-05-10 VITALS — BP 100/56 | HR 68

## 2013-05-10 DIAGNOSIS — Z954 Presence of other heart-valve replacement: Secondary | ICD-10-CM

## 2013-05-10 DIAGNOSIS — Z7901 Long term (current) use of anticoagulants: Secondary | ICD-10-CM

## 2013-05-10 DIAGNOSIS — Z952 Presence of prosthetic heart valve: Secondary | ICD-10-CM

## 2013-05-10 LAB — POCT INR: INR: 3.1

## 2013-05-18 ENCOUNTER — Encounter: Payer: Medicare Other | Admitting: *Deleted

## 2013-06-01 ENCOUNTER — Other Ambulatory Visit: Payer: Self-pay | Admitting: Cardiovascular Disease

## 2013-06-08 ENCOUNTER — Ambulatory Visit (INDEPENDENT_AMBULATORY_CARE_PROVIDER_SITE_OTHER): Payer: Medicare Other | Admitting: Pharmacist Clinician (PhC)/ Clinical Pharmacy Specialist

## 2013-06-08 VITALS — BP 110/60 | HR 76

## 2013-06-08 DIAGNOSIS — Z954 Presence of other heart-valve replacement: Secondary | ICD-10-CM

## 2013-06-08 DIAGNOSIS — Z952 Presence of prosthetic heart valve: Secondary | ICD-10-CM

## 2013-06-08 DIAGNOSIS — Z7901 Long term (current) use of anticoagulants: Secondary | ICD-10-CM

## 2013-06-08 LAB — POCT INR: INR: 2

## 2013-06-08 MED ORDER — ROSUVASTATIN CALCIUM 5 MG PO TABS: 10.0000 mg | ORAL_TABLET | Freq: Every day | ORAL | Status: DC

## 2013-06-16 ENCOUNTER — Other Ambulatory Visit: Payer: Self-pay | Admitting: Cardiovascular Disease

## 2013-06-16 NOTE — Telephone Encounter (Signed)
Rx was sent to pharmacy electronically. 

## 2013-06-29 ENCOUNTER — Ambulatory Visit (INDEPENDENT_AMBULATORY_CARE_PROVIDER_SITE_OTHER): Payer: Medicare Other | Admitting: Pharmacist Clinician (PhC)/ Clinical Pharmacy Specialist

## 2013-06-29 VITALS — BP 120/60 | HR 80

## 2013-06-29 DIAGNOSIS — Z954 Presence of other heart-valve replacement: Secondary | ICD-10-CM

## 2013-06-29 DIAGNOSIS — Z952 Presence of prosthetic heart valve: Secondary | ICD-10-CM

## 2013-06-29 DIAGNOSIS — Z7901 Long term (current) use of anticoagulants: Secondary | ICD-10-CM

## 2013-06-29 LAB — POCT INR: INR: 2.2

## 2013-07-11 ENCOUNTER — Encounter: Payer: Self-pay | Admitting: Internal Medicine

## 2013-07-13 ENCOUNTER — Ambulatory Visit (INDEPENDENT_AMBULATORY_CARE_PROVIDER_SITE_OTHER): Payer: Medicare Other | Admitting: Pharmacist Clinician (PhC)/ Clinical Pharmacy Specialist

## 2013-07-13 VITALS — BP 120/60 | HR 84

## 2013-07-13 DIAGNOSIS — Z954 Presence of other heart-valve replacement: Secondary | ICD-10-CM

## 2013-07-13 DIAGNOSIS — Z952 Presence of prosthetic heart valve: Secondary | ICD-10-CM

## 2013-07-13 DIAGNOSIS — Z7901 Long term (current) use of anticoagulants: Secondary | ICD-10-CM

## 2013-07-13 LAB — POCT INR: INR: 1.7

## 2013-07-27 ENCOUNTER — Ambulatory Visit (INDEPENDENT_AMBULATORY_CARE_PROVIDER_SITE_OTHER): Payer: Medicare Other | Admitting: Pharmacist Clinician (PhC)/ Clinical Pharmacy Specialist

## 2013-07-27 VITALS — BP 124/60 | HR 76

## 2013-07-27 DIAGNOSIS — Z7901 Long term (current) use of anticoagulants: Secondary | ICD-10-CM

## 2013-07-27 DIAGNOSIS — Z954 Presence of other heart-valve replacement: Secondary | ICD-10-CM

## 2013-07-27 DIAGNOSIS — Z952 Presence of prosthetic heart valve: Secondary | ICD-10-CM

## 2013-07-27 LAB — POCT INR: INR: 2.3

## 2013-08-02 ENCOUNTER — Other Ambulatory Visit: Payer: Self-pay | Admitting: Cardiovascular Disease

## 2013-08-02 NOTE — Telephone Encounter (Signed)
Rx was sent to pharmacy electronically. 

## 2013-08-03 ENCOUNTER — Encounter: Payer: Self-pay | Admitting: Cardiovascular Disease

## 2013-08-03 ENCOUNTER — Ambulatory Visit (INDEPENDENT_AMBULATORY_CARE_PROVIDER_SITE_OTHER): Payer: Medicare Other | Admitting: Cardiovascular Disease

## 2013-08-03 VITALS — BP 90/60 | HR 68 | Ht 62.0 in | Wt 129.7 lb

## 2013-08-03 DIAGNOSIS — Z952 Presence of prosthetic heart valve: Secondary | ICD-10-CM

## 2013-08-03 DIAGNOSIS — Z95810 Presence of automatic (implantable) cardiac defibrillator: Secondary | ICD-10-CM

## 2013-08-03 DIAGNOSIS — Z954 Presence of other heart-valve replacement: Secondary | ICD-10-CM

## 2013-08-03 DIAGNOSIS — E785 Hyperlipidemia, unspecified: Secondary | ICD-10-CM

## 2013-08-03 MED ORDER — SPIRONOLACTONE 25 MG PO TABS: ORAL_TABLET | ORAL | Status: DC

## 2013-08-03 MED ORDER — WARFARIN SODIUM 2.5 MG PO TABS: ORAL_TABLET | ORAL | Status: DC

## 2013-08-03 MED ORDER — AMIODARONE HCL 200 MG PO TABS: 100.0000 mg | ORAL_TABLET | Freq: Every day | ORAL | Status: DC

## 2013-08-03 MED ORDER — OMEPRAZOLE 40 MG PO CPDR: DELAYED_RELEASE_CAPSULE | ORAL | Status: DC

## 2013-08-03 NOTE — Patient Instructions (Signed)
  We will see you back in follow up in 1 year with Dr Gwenlyn Found  Dr Gwenlyn Found has ordered an echocardiogram to be done in July.

## 2013-08-03 NOTE — Assessment & Plan Note (Addendum)
Patient has BiV ICD by Dr. Sallyanne Kuster . She was a CRT responder with improvement in her LVEF from 10% to 50-60%. She gets automatic outpatient interrogation and sees Dr. Sallyanne Kuster back on an annual basis.

## 2013-08-03 NOTE — Assessment & Plan Note (Signed)
On statin therapy followed by her PCP 

## 2013-08-03 NOTE — Progress Notes (Signed)
08/03/2013 [name]   Dec 08, 1940  [id number]  Primary Physician [name], MD Primary Cardiologist: Lorretta Harp MD Renae Gloss   HPI:  The patient is a 73 year old, thin appearing, married, Caucasian female, mother of three (one deceased child), grandmother to eight grandchildren, who I last saw six months ago. Her husband is also a patient of mine. He had stage IV lung cancer, which responded to chemotherapy. 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 Berle Mull in 2002 with redo with a St. [name] MVR performed by Dr. Gilford Raid in 2007. She had normal coronary arteries. Her EF improved initially from 10% to normal with cardiac resynchronization therapy. Dr. Sallyanne Kuster follows this. She has undergone a generator change by Dr. Sallyanne Kuster on April 22, 2011, and she is followed by [employer]. She is otherwise asymptomatic.     Current Outpatient Prescriptions  Medication Sig Dispense Refill  . acetaminophen (TYLENOL) 500 MG tablet Take 500 mg by mouth as needed (for moderate-severe pain). For pain.      [name] allopurinol (ZYLOPRIM) 300 MG tablet Take 300 mg by mouth daily.      [name] ALPRAZolam (XANAX) 0.25 MG tablet Take 0.5 mg by mouth at bedtime as needed. For sleep.       [name] amiodarone (PACERONE) 200 MG tablet Take 100 mg by mouth daily.        [name] DIOVAN 80 MG tablet TAKE 1 TABLET BY MOUTH DAILY  30 tablet  7  . Fluticasone-Salmeterol (ADVAIR) 250-50 MCG/DOSE AEPB Inhale 1 puff into the lungs every 12 (twelve) hours.        . furosemide (LASIX) 40 MG tablet TAKE 2 TABLETS BY MOUTH EVERY MORNING AND 1 TAB EVERY AFTERNOON  90 tablet  6  . metoprolol tartrate (LOPRESSOR) 25 MG tablet Take 25 mg by mouth 2 (two) times daily.        . Multiple Vitamins-Minerals (MULTIVITAMINS THER. W/MINERALS) TABS Take 1 tablet by mouth daily.        [name] omeprazole (PRILOSEC) 40 MG capsule TAKE ONE CAPSULE BY MOUTH EVERY DAY  30  capsule  6  . oxyCODONE-acetaminophen (PERCOCET/ROXICET) 5-325 MG per tablet Take 1 tablet by mouth 2 (two) times daily.      . rosuvastatin (CRESTOR) 5 MG tablet Take 2 tablets (10 mg total) by mouth daily.  35 tablet  0  . sertraline (ZOLOFT) 25 MG tablet Take 50 mg by mouth every morning.       [name] spironolactone (ALDACTONE) 25 MG tablet TAKE 1 TABLET DAILY.  30 tablet  5  . Vitamin D, Ergocalciferol, (DRISDOL) 50000 UNITS CAPS Take 50,000 Units by mouth once a week. On thrusdays.       [name] warfarin (COUMADIN) 2.5 MG tablet Take 1 tablet by mouth daily or as directed  90 tablet  1   No current facility-administered medications for this visit.    Allergies  Allergen Reactions  . Ace Inhibitors Cough  . Lipitor [Atorvastatin Calcium] Other (See Comments)    Knee pain.    History   Social History  . Marital Status: Married    Spouse Name: N/A    Number of Children: 4  . Years of Education: N/A   Occupational History  . retired    Social History Main Topics  . Smoking status: Former Smoker    Types: Cigarettes    Quit date: 06/02/2001  . Smokeless tobacco: Not on file  .  Alcohol Use: Yes     Comment: social-1 every 6 months  . Drug Use: No  . Sexual Activity: Not on file   Other Topics Concern  . Not on file   Social History Narrative  . No narrative on file     Review of Systems: General: negative for chills, fever, night sweats or weight changes.  Cardiovascular: negative for chest pain, dyspnea on exertion, edema, orthopnea, palpitations, paroxysmal nocturnal dyspnea or shortness of breath Dermatological: negative for rash Respiratory: negative for cough or wheezing Urologic: negative for hematuria Abdominal: negative for nausea, vomiting, diarrhea, bright red blood per rectum, melena, or hematemesis Neurologic: negative for visual changes, syncope, or dizziness All other systems reviewed and are otherwise negative except as noted above.    Blood pressure  90/60, pulse 68, height 5' 2"  (1.575 m), weight 58.832 kg (129 lb 11.2 oz).  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 Because by 2-D echo on annual basis. This was mechanical prosthesis. She is asymptomatic.  HYPERLIPIDEMIA On statin therapy followed by her PCP  Biventricular ICD (implantable cardioverter-defibrillator) in place Patient has BiV ICD by Dr. Sallyanne Kuster . She was a CRT responder with improvement in her LVEF from 10% to 50-60%. She gets automatic outpatient interrogation and sees Dr. Sallyanne Kuster back on an annual basis.      Lorretta Harp MD FACP,FACC,FAHA, [facility] 08/03/2013 10:26 AM

## 2013-08-03 NOTE — Assessment & Plan Note (Signed)
St. [name] MVR implanted in 2007. Because by 2-D echo on annual basis. This was mechanical prosthesis. She is asymptomatic.

## 2013-08-17 ENCOUNTER — Ambulatory Visit (INDEPENDENT_AMBULATORY_CARE_PROVIDER_SITE_OTHER): Payer: Medicare Other | Admitting: Pharmacist Clinician (PhC)/ Clinical Pharmacy Specialist

## 2013-08-17 VITALS — BP 110/60 | HR 84

## 2013-08-17 DIAGNOSIS — Z954 Presence of other heart-valve replacement: Secondary | ICD-10-CM

## 2013-08-17 DIAGNOSIS — Z952 Presence of prosthetic heart valve: Secondary | ICD-10-CM

## 2013-08-17 DIAGNOSIS — Z7901 Long term (current) use of anticoagulants: Secondary | ICD-10-CM

## 2013-08-17 LAB — POCT INR: INR: 3.9

## 2013-08-22 ENCOUNTER — Other Ambulatory Visit: Payer: Self-pay | Admitting: Internal Medicine

## 2013-08-22 DIAGNOSIS — R1011 Right upper quadrant pain: Secondary | ICD-10-CM

## 2013-08-24 ENCOUNTER — Ambulatory Visit
Admission: RE | Admit: 2013-08-24 | Discharge: 2013-08-24 | Disposition: A | Discharge status: Home / Self Care | Payer: Medicare Other | Source: Ambulatory Visit | Attending: Internal Medicine | Admitting: Internal Medicine

## 2013-08-24 DIAGNOSIS — R1011 Right upper quadrant pain: Secondary | ICD-10-CM

## 2013-08-29 ENCOUNTER — Other Ambulatory Visit: Payer: Self-pay | Admitting: Internal Medicine

## 2013-08-29 DIAGNOSIS — R9389 Abnormal findings on diagnostic imaging of other specified body structures: Secondary | ICD-10-CM

## 2013-09-01 ENCOUNTER — Other Ambulatory Visit: Payer: Self-pay

## 2013-09-01 ENCOUNTER — Ambulatory Visit
Admission: RE | Admit: 2013-09-01 | Discharge: 2013-09-01 | Disposition: A | Discharge status: Home / Self Care | Payer: Medicare Other | Source: Ambulatory Visit | Attending: Internal Medicine | Admitting: Internal Medicine

## 2013-09-01 DIAGNOSIS — R9389 Abnormal findings on diagnostic imaging of other specified body structures: Secondary | ICD-10-CM

## 2013-09-01 MED ORDER — FUROSEMIDE 40 MG PO TABS: ORAL_TABLET | ORAL | Status: DC

## 2013-09-01 MED ORDER — IOHEXOL 350 MG/ML SOLN
80.0000 mL | Freq: Once | INTRAVENOUS | Status: AC | PRN
  Administered 2013-09-01: 80 mL via INTRAVENOUS

## 2013-09-01 NOTE — Telephone Encounter (Signed)
Rx was sent to pharmacy electronically. 

## 2013-09-07 ENCOUNTER — Ambulatory Visit (INDEPENDENT_AMBULATORY_CARE_PROVIDER_SITE_OTHER): Payer: Medicare Other | Admitting: Pharmacist Clinician (PhC)/ Clinical Pharmacy Specialist

## 2013-09-07 VITALS — BP 120/60 | HR 84

## 2013-09-07 DIAGNOSIS — Z7901 Long term (current) use of anticoagulants: Secondary | ICD-10-CM

## 2013-09-07 DIAGNOSIS — Z954 Presence of other heart-valve replacement: Secondary | ICD-10-CM

## 2013-09-07 DIAGNOSIS — Z952 Presence of prosthetic heart valve: Secondary | ICD-10-CM

## 2013-09-07 LAB — POCT INR: INR: 3.2

## 2013-09-13 ENCOUNTER — Ambulatory Visit (INDEPENDENT_AMBULATORY_CARE_PROVIDER_SITE_OTHER): Payer: Medicare Other | Admitting: General Surgery

## 2013-09-23 ENCOUNTER — Ambulatory Visit (INDEPENDENT_AMBULATORY_CARE_PROVIDER_SITE_OTHER): Payer: Medicare Other | Admitting: General Surgery

## 2013-09-23 ENCOUNTER — Telehealth (INDEPENDENT_AMBULATORY_CARE_PROVIDER_SITE_OTHER): Payer: Self-pay

## 2013-09-23 ENCOUNTER — Encounter (INDEPENDENT_AMBULATORY_CARE_PROVIDER_SITE_OTHER): Payer: Self-pay | Admitting: General Surgery

## 2013-09-23 ENCOUNTER — Encounter (INDEPENDENT_AMBULATORY_CARE_PROVIDER_SITE_OTHER): Payer: Self-pay

## 2013-09-23 VITALS — BP 108/60 | HR 78 | Temp 97.0°F | Resp 14 | Ht 62.0 in | Wt 125.8 lb

## 2013-09-23 DIAGNOSIS — K8020 Calculus of gallbladder without cholecystitis without obstruction: Secondary | ICD-10-CM

## 2013-09-23 NOTE — Progress Notes (Signed)
Subjective:   abdominal pain  Patient ID: [name], female   DOB: 05/04/1941, 73 y.o.   MRN: [id number]  HPI The patient is a 73 year old female referred by Dr. Philip Aspen 4 apparent symptomatic cholelithiasis. The patient states that for the last several months she has had ongoing problems with epigastric abdominal pain. She has a lot of chronic pain in general from arthritis and fibromyalgia and other medical issues. However she has had more severe epigastric pain which is episodic and related to meals. She describes sharp pain in the epigastrium without radiation. She has chronic bowel problems with diarrhea and constipation felt secondary to irritable bowel. These are unchanged. No melena or hematochezia. She had a colonoscopy about 4 years ago.an ultrasound was obtained to evaluate her abdominal pain. This revealed multiple gallstones with no gallbladder wall thickening and normal common bile duct. Also noted was a 2.9 cm hypoechoic region in the hepatic parenchyma. Subsequent CT scan with IV contrast was obtained which showed an ill-defined area of low attenuation in the medial segment of the left hepatic lobe most consistent with fatty infiltration. MRI cannot be performed due to the patient's defibrillator.  Past Medical History  Diagnosis Date  . Colon polyp     adenomatous  . Hemorrhoids   . GERD (gastroesophageal reflux disease)   . Hyperlipidemia   . Bursitis   . Diverticulosis   . Anemia   . MVP (mitral valve prolapse)   . CHF (congestive heart failure)   . Arthritis   . Chronic kidney disease   . Fibromyalgia   . COPD (chronic obstructive pulmonary disease)   . IBS (irritable bowel syndrome)   . Peptic ulcer disease   . H/O mitral valve replacement   . Biventricular ICD (implantable cardioverter-defibrillator) in place     with CRT   Past Surgical History  Procedure Laterality Date  . Abdominal hysterectomy    . Mitral valve replacement    . Cardiac defibrillator  placement  2007, 2012    x2 and pacemaker   Current Outpatient Prescriptions  Medication Sig Dispense Refill  . acetaminophen (TYLENOL) 500 MG tablet Take 500 mg by mouth as needed (for moderate-severe pain). For pain.      [name] allopurinol (ZYLOPRIM) 300 MG tablet Take 300 mg by mouth daily.      [name] ALPRAZolam (XANAX) 0.25 MG tablet Take 0.5 mg by mouth at bedtime as needed. For sleep.       [name] amiodarone (PACERONE) 200 MG tablet Take 0.5 tablets (100 mg total) by mouth daily.  30 tablet  11  . DIOVAN 80 MG tablet TAKE 1 TABLET BY MOUTH DAILY  30 tablet  7  . Fluticasone-Salmeterol (ADVAIR) 250-50 MCG/DOSE AEPB Inhale 1 puff into the lungs every 12 (twelve) hours.        . furosemide (LASIX) 40 MG tablet TAKE 2 TABLETS BY MOUTH EVERY MORNING AND 1 TAB EVERY AFTERNOON  90 tablet  11  . gabapentin (NEURONTIN) 300 MG capsule Take 300 mg by mouth at bedtime.      . metoCLOPramide (REGLAN) 5 MG tablet Take 5 mg by mouth daily.       . metoprolol tartrate (LOPRESSOR) 25 MG tablet Take 25 mg by mouth 2 (two) times daily.        . Multiple Vitamins-Minerals (MULTIVITAMINS THER. W/MINERALS) TABS Take 1 tablet by mouth daily.        [name] omeprazole (PRILOSEC) 40 MG capsule TAKE ONE CAPSULE BY MOUTH EVERY DAY  30 capsule  11  . oxyCODONE-acetaminophen (PERCOCET/ROXICET) 5-325 MG per tablet Take 1 tablet by mouth 2 (two) times daily.      . rosuvastatin (CRESTOR) 5 MG tablet Take 2 tablets (10 mg total) by mouth daily.  35 tablet  0  . sertraline (ZOLOFT) 25 MG tablet Take 50 mg by mouth every morning.       [name] spironolactone (ALDACTONE) 25 MG tablet TAKE 1 TABLET DAILY.  30 tablet  11  . Vitamin D, Ergocalciferol, (DRISDOL) 50000 UNITS CAPS Take 50,000 Units by mouth once a week. On thrusdays.       [name] warfarin (COUMADIN) 2.5 MG tablet Take 1 tablet by mouth daily or as directed  90 tablet  1   No current facility-administered medications for this visit.   Allergies  Allergen Reactions  . Ace Inhibitors  Cough  . Lipitor [Atorvastatin Calcium] Other (See Comments)    Knee pain.   History  Substance Use Topics  . Smoking status: Former Smoker    Types: Cigarettes    Quit date: 06/02/2001  . Smokeless tobacco: Not on file  . Alcohol Use: Yes     Comment: social-1 every 6 months     Review of Systems  Constitutional: Negative.   Respiratory: Negative for shortness of breath and wheezing.   Cardiovascular: Negative.   Gastrointestinal: Positive for nausea, abdominal pain, diarrhea and constipation. Negative for blood in stool and anal bleeding.  Genitourinary: Negative.   Musculoskeletal: Positive for arthralgias and myalgias.       Objective:   Physical Exam BP 108/60  Pulse 78  Temp(Src) 97 F (36.1 C) (Temporal)  Resp 14  Ht 5' 2"  (1.575 m)  Wt 125 lb 12.8 oz (57.063 kg)  BMI 23.00 kg/m2 General: Alert, Elderly Caucasian female, in no distress Skin: Warm and dry without rash or infection. HEENT: No palpable masses or thyromegaly. Sclera nonicteric. Pupils equal round and reactive. Oropharynx clear. Lungs: Breath sounds clear and equal without increased work of breathing Cardiovascular:healed sternotomy. 2/6 systolic murmur with crisp valve click. No edema. Peripheral pulses intact. Abdomen: Nondistended. Moderate localized epigastric tenderness and mild diffuse tenderness. No masses palpable. No organomegaly. No palpable hernias. Extremities: No edema or joint swelling or deformity. No chronic venous stasis changes. Neurologic: Alert and fully oriented. Gait normal.    Assessment:     Symptomatic cholelithiasis/cholecystitis. She has multiple other medical issues including significant cardiac history but her cardiac function seems good and my impression that she would be a reasonable operative risk. I recommended proceeding with laparoscopic cholecystectomy with cholangiogram in an attempt to relieve her symptoms and prevent future complications.I discussed the procedure  in detail.  The patient was given Neurosurgeon.  We discussed the risks and benefits of a laparoscopic cholecystectomy and possible cholangiogram including, but not limited to bleeding, infection, injury to surrounding structures such as the intestine or liver, bile leak, retained gallstones, need to convert to an open procedure, prolonged diarrhea, blood clots such as  DVT, common bile duct injury, anesthesia risks, and possible need for additional procedures.  The likelihood of improvement in symptoms and return to the patient's normal status is good. We discussed the typical post-operative recovery course.we will get preoperative clearance from Dr. Gwenlyn Found and recommendations on management of her anticoagulation.     Plan:     Arthroscopic cholecystectomy with cholangiogram overnight hospitalization and we will obtain cardiac clearance and recommendations for management of her anticoagulation.

## 2013-09-23 NOTE — Telephone Encounter (Signed)
Cardiac and anticoagulation clearance faxed and sent via EPIC to Dr. Gwenlyn Found @ [phone]

## 2013-09-28 ENCOUNTER — Encounter: Payer: Self-pay | Admitting: *Deleted

## 2013-09-29 ENCOUNTER — Telehealth: Payer: Self-pay | Admitting: *Deleted

## 2013-09-29 NOTE — Telephone Encounter (Signed)
Dr Gwenlyn Found reviewed the chart and cleared patient for a laparoscopic cholecystectomy, but she does require lovenox bridging.  Letter drafted and sent to [facility]/Dr Hoxworth  Chart routed to Clarissa as Juluis Rainier

## 2013-10-03 ENCOUNTER — Telehealth (INDEPENDENT_AMBULATORY_CARE_PROVIDER_SITE_OTHER): Payer: Self-pay

## 2013-10-03 ENCOUNTER — Telehealth: Payer: Self-pay | Admitting: Pharmacist Clinician (PhC)/ Clinical Pharmacy Specialist

## 2013-10-03 ENCOUNTER — Ambulatory Visit (INDEPENDENT_AMBULATORY_CARE_PROVIDER_SITE_OTHER): Payer: Medicare Other | Admitting: Pharmacist Clinician (PhC)/ Clinical Pharmacy Specialist

## 2013-10-03 DIAGNOSIS — Z7901 Long term (current) use of anticoagulants: Secondary | ICD-10-CM

## 2013-10-03 DIAGNOSIS — Z952 Presence of prosthetic heart valve: Secondary | ICD-10-CM

## 2013-10-03 DIAGNOSIS — Z954 Presence of other heart-valve replacement: Secondary | ICD-10-CM

## 2013-10-03 LAB — POCT INR: INR: 2.9

## 2013-10-03 NOTE — Telephone Encounter (Signed)
Called patient to make aware we have received her Cardiac Clearance and to be expecting a call from our office to schedule surgery. Patient aware to call Dr. Gwenlyn Found when she has a surgery date so they can manage her Lovenox Bridging.

## 2013-10-03 NOTE — Patient Instructions (Signed)
When you have a date set for your surgery, please call Erasmo Downer and let her know the date.  Call [phone].

## 2013-10-03 NOTE — Telephone Encounter (Signed)
Both called to say pt scheduled for laproscopic cholecystectomy on 5/29 at 7:30am

## 2013-10-03 NOTE — Telephone Encounter (Signed)
Message copied by Ivor Costa on Mon Oct 03, 2013  1:54 PM ------      Message from: Salvatore Marvel      Created: Mon Oct 03, 2013 10:43 AM      Regarding: Dr. Janne Napoleon Sx      Contact: [phone]       Patient called and wants to know if we received the clearance from Dr. Gwenlyn Found for her Surgery, please call her.            Thank you. ------

## 2013-10-04 ENCOUNTER — Ambulatory Visit (INDEPENDENT_AMBULATORY_CARE_PROVIDER_SITE_OTHER): Payer: Medicare Other | Admitting: General Surgery

## 2013-10-05 ENCOUNTER — Telehealth: Payer: Self-pay | Admitting: Pharmacist Clinician (PhC)/ Clinical Pharmacy Specialist

## 2013-10-05 ENCOUNTER — Other Ambulatory Visit: Payer: Self-pay | Admitting: Pharmacist Clinician (PhC)/ Clinical Pharmacy Specialist

## 2013-10-05 MED ORDER — ENOXAPARIN SODIUM 30 MG/0.3ML ~~LOC~~ SOLN: SUBCUTANEOUS | Status: DC

## 2013-10-05 NOTE — Telephone Encounter (Signed)
Message copied by Rockne Menghini on Wed Oct 05, 2013 10:09 AM ------      Message from: Ivor Costa      Created: Mon Oct 03, 2013  1:56 PM      Regarding: Surgery       Patient aware to call you with a surgery date for Lovenox Bridging.    Our schedulers should be contacting patient today            Ivor Costa, CMA for      Dr. Excell Seltzer, MD            ----- Message -----         From: Tommy Medal, RPH-CPP         Sent: 09/30/2013   9:09 AM           To: Ivor Costa, CMA            Christy            Please let me know when a date is set for Ms. Batz's surgery.  I will take care of stopping her warfarin and lovenox bridging.  I do prefer to have at least a weeks notice so we can get everything in place.              Thanks for your help            Carlus Pavlov PharmDCPP      [facility]       ------

## 2013-10-05 NOTE — Telephone Encounter (Signed)
Surgery set for 5/29.  Pt will spend 1 night at hospital afterwards.  Lovenox bridging information mailed to patient today, she is to call when she receives it.  Pt aware

## 2013-10-05 NOTE — Telephone Encounter (Signed)
Disregard previous letter for lovenox bridging.  Pt CrCl is <30, so will dose with 78m lovenox qd.  Letter corrected here

## 2013-10-12 ENCOUNTER — Other Ambulatory Visit: Payer: Self-pay

## 2013-10-12 MED ORDER — OMEPRAZOLE 40 MG PO CPDR: DELAYED_RELEASE_CAPSULE | ORAL | Status: DC

## 2013-10-12 MED ORDER — FUROSEMIDE 40 MG PO TABS: ORAL_TABLET | ORAL | Status: DC

## 2013-10-12 MED ORDER — VALSARTAN 80 MG PO TABS: 80.0000 mg | ORAL_TABLET | Freq: Every day | ORAL | Status: DC

## 2013-10-12 MED ORDER — SPIRONOLACTONE 25 MG PO TABS: ORAL_TABLET | ORAL | Status: DC

## 2013-10-12 NOTE — Addendum Note (Signed)
Addended by: Diana Eves on: 10/12/2013 03:02 PM   Modules accepted: Orders

## 2013-10-12 NOTE — Telephone Encounter (Signed)
Rx was sent to pharmacy electronically. 

## 2013-10-12 NOTE — Addendum Note (Signed)
Addended by: Diana Eves on: 10/12/2013 04:03 PM   Modules accepted: Orders

## 2013-10-12 NOTE — Addendum Note (Signed)
Addended by: Diana Eves on: 10/12/2013 03:01 PM   Modules accepted: Orders

## 2013-10-17 ENCOUNTER — Encounter (HOSPITAL_COMMUNITY): Payer: Self-pay | Admitting: Pharmacist

## 2013-10-18 ENCOUNTER — Ambulatory Visit (INDEPENDENT_AMBULATORY_CARE_PROVIDER_SITE_OTHER): Payer: Medicare Other | Admitting: *Deleted

## 2013-10-18 DIAGNOSIS — I4891 Unspecified atrial fibrillation: Secondary | ICD-10-CM

## 2013-10-18 DIAGNOSIS — I509 Heart failure, unspecified: Secondary | ICD-10-CM

## 2013-10-19 ENCOUNTER — Encounter (HOSPITAL_COMMUNITY): Payer: Self-pay

## 2013-10-19 ENCOUNTER — Ambulatory Visit (HOSPITAL_COMMUNITY)
Admission: RE | Admit: 2013-10-19 | Discharge: 2013-10-19 | Disposition: A | Discharge status: Home / Self Care | Payer: Medicare Other | Source: Ambulatory Visit | Attending: General Surgery | Admitting: General Surgery

## 2013-10-19 ENCOUNTER — Encounter (HOSPITAL_COMMUNITY)
Admission: RE | Admit: 2013-10-19 | Discharge: 2013-10-19 | Disposition: A | Discharge status: Home / Self Care | Payer: Medicare Other | Source: Ambulatory Visit | Attending: General Surgery | Admitting: General Surgery

## 2013-10-19 DIAGNOSIS — Z01818 Encounter for other preprocedural examination: Secondary | ICD-10-CM | POA: Insufficient documentation

## 2013-10-19 DIAGNOSIS — Z95810 Presence of automatic (implantable) cardiac defibrillator: Secondary | ICD-10-CM | POA: Insufficient documentation

## 2013-10-19 DIAGNOSIS — Z01812 Encounter for preprocedural laboratory examination: Secondary | ICD-10-CM | POA: Insufficient documentation

## 2013-10-19 HISTORY — DX: Cardiac arrhythmia, unspecified: I49.9

## 2013-10-19 HISTORY — DX: Gout, unspecified: M10.9

## 2013-10-19 HISTORY — DX: Presence of cardiac pacemaker: Z95.0

## 2013-10-19 HISTORY — DX: Presence of automatic (implantable) cardiac defibrillator: Z95.810

## 2013-10-19 LAB — CBC
HEMATOCRIT: 36.7 % (ref 36.0–46.0)
HEMOGLOBIN: 12.3 g/dL (ref 12.0–15.0)
MCH: 30.8 pg (ref 26.0–34.0)
MCHC: 33.5 g/dL (ref 30.0–36.0)
MCV: 91.8 fL (ref 78.0–100.0)
Platelets: 294 10*3/uL (ref 150–400)
RBC: 4 MIL/uL (ref 3.87–5.11)
RDW: 15.6 % — ABNORMAL HIGH (ref 11.5–15.5)
WBC: 12.6 10*3/uL — AB (ref 4.0–10.5)

## 2013-10-19 LAB — BASIC METABOLIC PANEL
BUN: 23 mg/dL (ref 6–23)
CO2: 30 mEq/L (ref 19–32)
Calcium: 10 mg/dL (ref 8.4–10.5)
Chloride: 94 mEq/L — ABNORMAL LOW (ref 96–112)
Creatinine, Ser: 1.48 mg/dL — ABNORMAL HIGH (ref 0.50–1.10)
GFR calc Af Amer: 40 mL/min — ABNORMAL LOW (ref >90)
GFR calc non Af Amer: 34 mL/min — ABNORMAL LOW (ref >90)
GLUCOSE: 102 mg/dL — AB (ref 70–99)
POTASSIUM: 4.5 meq/L (ref 3.7–5.3)
Sodium: 136 mEq/L — ABNORMAL LOW (ref 137–147)

## 2013-10-19 NOTE — Pre-Procedure Instructions (Signed)
[name]  10/19/2013   Your procedure is scheduled on:  10/28/13  Report to [facility] Admitting at 530 AM.  Call this number if you have problems the morning of surgery: [phone]   Remember:   Do not eat food or drink liquids after midnight.   Take these medicines the morning of surgery with A SIP OF WATER: allopurinol,xanax,amiodarone,lovenox,neurontin,metoprolol,prilosec,oxycodone,zoloft,aldactone   Do not wear jewelry, make-up or nail polish.  Do not wear lotions, powders, or perfumes. You may wear deodorant.  Do not shave 48 hours prior to surgery. Men may shave face and neck.  Do not bring valuables to the hospital.  [facility] is not responsible                  for any belongings or valuables.               Contacts, dentures or bridgework may not be worn into surgery.  Leave suitcase in the car. After surgery it may be brought to your room.  For patients admitted to the hospital, discharge time is determined by your                treatment team.               Patients discharged the day of surgery will not be allowed to drive  home.  Name and phone number of your driver: family  Special Instructions: Shower using CHG 2 nights before surgery and the night before surgery.  If you shower the day of surgery use CHG.  Use special wash - you have one bottle of CHG for all showers.  You should use approximately 1/3 of the bottle for each shower.   Please read over the following fact sheets that you were given: Pain Booklet, Coughing and Deep Breathing and Surgical Site Infection Prevention

## 2013-10-19 NOTE — Progress Notes (Signed)
ICD  And pacemaker form faxed to DR Croitoru. Also [employer] rep notified.  Cardiac clearance in epic

## 2013-10-21 ENCOUNTER — Telehealth: Payer: Self-pay | Admitting: Cardiovascular Disease

## 2013-10-21 NOTE — Progress Notes (Signed)
Spoke with [facility] (Dr Sallyanne Kuster) [phone] and they said they will send a message to the doctor to address the pacemaker order form.  Also requested it be refaxed to them as they weren't sure they had it, which I have done.

## 2013-10-21 NOTE — Telephone Encounter (Signed)
Needs order for device for surgery.  Has faxed to [place] but not received back.  Please call

## 2013-10-21 NOTE — Telephone Encounter (Signed)
Informed Juliann Pulse form received. Will have Dr. Sallyanne Kuster to sign and fax back today.

## 2013-10-25 ENCOUNTER — Telehealth: Payer: Self-pay | Admitting: *Deleted

## 2013-10-25 NOTE — Telephone Encounter (Signed)
Perioperative prescription for implanted cardiac device programming faxed to [facility] pre-op.

## 2013-10-26 ENCOUNTER — Telehealth (INDEPENDENT_AMBULATORY_CARE_PROVIDER_SITE_OTHER): Payer: Self-pay

## 2013-10-26 NOTE — Telephone Encounter (Addendum)
Patient states she has a head cold denies temp. Advised I would Inform DR. Hoxworth and she is to call if her symptoms becomes worse or she has a temp 100.3 or greater . Patient verbalized understanding

## 2013-10-27 MED ORDER — CEFAZOLIN SODIUM-DEXTROSE 2-3 GM-% IV SOLR
2.0000 g | INTRAVENOUS | Status: AC
  Administered 2013-10-28: 2 g via INTRAVENOUS
  Filled 2013-10-27: qty 50

## 2013-10-28 ENCOUNTER — Observation Stay (HOSPITAL_COMMUNITY)
Admission: RE | Admit: 2013-10-28 | Discharge: 2013-10-30 | Disposition: A | Discharge status: Home / Self Care | Payer: Medicare Other | Source: Ambulatory Visit | Attending: General Surgery | Admitting: General Surgery

## 2013-10-28 ENCOUNTER — Observation Stay (HOSPITAL_COMMUNITY): Payer: Medicare Other

## 2013-10-28 ENCOUNTER — Ambulatory Visit (HOSPITAL_COMMUNITY): Payer: Medicare Other

## 2013-10-28 ENCOUNTER — Encounter (HOSPITAL_COMMUNITY)
Admission: RE | Disposition: A | Discharge status: Home / Self Care | Payer: Self-pay | Source: Ambulatory Visit | Attending: General Surgery

## 2013-10-28 ENCOUNTER — Encounter (HOSPITAL_COMMUNITY): Payer: Medicare Other | Admitting: Anesthesiology

## 2013-10-28 ENCOUNTER — Encounter (HOSPITAL_COMMUNITY): Payer: Self-pay | Admitting: *Deleted

## 2013-10-28 ENCOUNTER — Ambulatory Visit (HOSPITAL_COMMUNITY): Payer: Medicare Other | Admitting: Anesthesiology

## 2013-10-28 DIAGNOSIS — E785 Hyperlipidemia, unspecified: Secondary | ICD-10-CM | POA: Insufficient documentation

## 2013-10-28 DIAGNOSIS — K8010 Calculus of gallbladder with chronic cholecystitis without obstruction: Secondary | ICD-10-CM

## 2013-10-28 DIAGNOSIS — K589 Irritable bowel syndrome without diarrhea: Secondary | ICD-10-CM | POA: Insufficient documentation

## 2013-10-28 DIAGNOSIS — Z954 Presence of other heart-valve replacement: Secondary | ICD-10-CM | POA: Insufficient documentation

## 2013-10-28 DIAGNOSIS — Z87891 Personal history of nicotine dependence: Secondary | ICD-10-CM | POA: Insufficient documentation

## 2013-10-28 DIAGNOSIS — K219 Gastro-esophageal reflux disease without esophagitis: Secondary | ICD-10-CM | POA: Insufficient documentation

## 2013-10-28 DIAGNOSIS — I129 Hypertensive chronic kidney disease with stage 1 through stage 4 chronic kidney disease, or unspecified chronic kidney disease: Secondary | ICD-10-CM | POA: Insufficient documentation

## 2013-10-28 DIAGNOSIS — M129 Arthropathy, unspecified: Secondary | ICD-10-CM | POA: Insufficient documentation

## 2013-10-28 DIAGNOSIS — J4489 Other specified chronic obstructive pulmonary disease: Secondary | ICD-10-CM | POA: Insufficient documentation

## 2013-10-28 DIAGNOSIS — Z7901 Long term (current) use of anticoagulants: Secondary | ICD-10-CM | POA: Insufficient documentation

## 2013-10-28 DIAGNOSIS — Z95810 Presence of automatic (implantable) cardiac defibrillator: Secondary | ICD-10-CM | POA: Insufficient documentation

## 2013-10-28 DIAGNOSIS — IMO0001 Reserved for inherently not codable concepts without codable children: Secondary | ICD-10-CM | POA: Insufficient documentation

## 2013-10-28 DIAGNOSIS — N189 Chronic kidney disease, unspecified: Secondary | ICD-10-CM | POA: Insufficient documentation

## 2013-10-28 DIAGNOSIS — Z79899 Other long term (current) drug therapy: Secondary | ICD-10-CM | POA: Insufficient documentation

## 2013-10-28 DIAGNOSIS — J449 Chronic obstructive pulmonary disease, unspecified: Secondary | ICD-10-CM | POA: Insufficient documentation

## 2013-10-28 DIAGNOSIS — I509 Heart failure, unspecified: Secondary | ICD-10-CM | POA: Insufficient documentation

## 2013-10-28 HISTORY — PX: CHOLECYSTECTOMY: SHX55

## 2013-10-28 LAB — PROTIME-INR
INR: 1.51 — AB (ref 0.00–1.49)
Prothrombin Time: 17.8 seconds — ABNORMAL HIGH (ref 11.6–15.2)

## 2013-10-28 LAB — APTT: aPTT: 47 seconds — ABNORMAL HIGH (ref 24–37)

## 2013-10-28 SURGERY — LAPAROSCOPIC CHOLECYSTECTOMY WITH INTRAOPERATIVE CHOLANGIOGRAM
Anesthesia: General | Site: Abdomen

## 2013-10-28 MED ORDER — FENTANYL CITRATE 0.05 MG/ML IJ SOLN
INTRAMUSCULAR | Status: AC
  Filled 2013-10-28: qty 5

## 2013-10-28 MED ORDER — NALOXONE HCL 0.4 MG/ML IJ SOLN
0.2000 mg | INTRAMUSCULAR | Status: DC | PRN
  Administered 2013-10-28: 0.2 mg via INTRAVENOUS

## 2013-10-28 MED ORDER — BUPIVACAINE-EPINEPHRINE (PF) 0.5% -1:200000 IJ SOLN
INTRAMUSCULAR | Status: AC
  Filled 2013-10-28: qty 30

## 2013-10-28 MED ORDER — ALPRAZOLAM 0.5 MG PO TABS
0.5000 mg | ORAL_TABLET | Freq: Every day | ORAL | Status: DC
  Administered 2013-10-28 – 2013-10-29 (×2): 0.5 mg via ORAL
  Filled 2013-10-28 (×2): qty 1

## 2013-10-28 MED ORDER — 0.9 % SODIUM CHLORIDE (POUR BTL) OPTIME
TOPICAL | Status: DC | PRN
  Administered 2013-10-28: 1000 mL

## 2013-10-28 MED ORDER — ONDANSETRON HCL 4 MG/2ML IJ SOLN: 4.0000 mg | Freq: Once | INTRAMUSCULAR | Status: DC | PRN

## 2013-10-28 MED ORDER — LIDOCAINE HCL (CARDIAC) 20 MG/ML IV SOLN
INTRAVENOUS | Status: DC | PRN
  Administered 2013-10-28: 60 mg via INTRATRACHEAL
  Administered 2013-10-28: 50 mg via INTRAVENOUS

## 2013-10-28 MED ORDER — PHENYLEPHRINE HCL 10 MG/ML IJ SOLN
10.0000 mg | INTRAVENOUS | Status: DC | PRN
  Administered 2013-10-28: 25 ug/min via INTRAVENOUS

## 2013-10-28 MED ORDER — ONDANSETRON HCL 4 MG/2ML IJ SOLN
INTRAMUSCULAR | Status: AC
Start: 2013-10-28 — End: 2013-10-28
  Filled 2013-10-28: qty 2

## 2013-10-28 MED ORDER — HEMOSTATIC AGENTS (NO CHARGE) OPTIME
TOPICAL | Status: DC | PRN
  Administered 2013-10-28: 1 via TOPICAL

## 2013-10-28 MED ORDER — ALLOPURINOL 300 MG PO TABS
300.0000 mg | ORAL_TABLET | Freq: Every evening | ORAL | Status: DC
  Administered 2013-10-29: 300 mg via ORAL
  Filled 2013-10-28 (×2): qty 1

## 2013-10-28 MED ORDER — LIDOCAINE HCL (CARDIAC) 20 MG/ML IV SOLN
INTRAVENOUS | Status: AC
  Filled 2013-10-28: qty 10

## 2013-10-28 MED ORDER — SPIRONOLACTONE 25 MG PO TABS
25.0000 mg | ORAL_TABLET | Freq: Every morning | ORAL | Status: DC
  Administered 2013-10-29: 25 mg via ORAL
  Filled 2013-10-28 (×3): qty 1

## 2013-10-28 MED ORDER — NEOSTIGMINE METHYLSULFATE 10 MG/10ML IV SOLN
INTRAVENOUS | Status: AC
  Filled 2013-10-28: qty 1

## 2013-10-28 MED ORDER — GLYCOPYRROLATE 0.2 MG/ML IJ SOLN
INTRAMUSCULAR | Status: DC | PRN
  Administered 2013-10-28: 0.6 mg via INTRAVENOUS

## 2013-10-28 MED ORDER — OXYCODONE-ACETAMINOPHEN 5-325 MG PO TABS
1.0000 | ORAL_TABLET | ORAL | Status: DC | PRN
  Administered 2013-10-28 – 2013-10-29 (×4): 1 via ORAL
  Filled 2013-10-28 (×4): qty 1

## 2013-10-28 MED ORDER — FUROSEMIDE 40 MG PO TABS
40.0000 mg | ORAL_TABLET | Freq: Every day | ORAL | Status: DC
  Administered 2013-10-28 – 2013-10-29 (×2): 40 mg via ORAL
  Filled 2013-10-28 (×3): qty 1

## 2013-10-28 MED ORDER — NEOSTIGMINE METHYLSULFATE 10 MG/10ML IV SOLN
INTRAVENOUS | Status: DC | PRN
  Administered 2013-10-28: 4 mg via INTRAVENOUS

## 2013-10-28 MED ORDER — FENTANYL CITRATE 0.05 MG/ML IJ SOLN
INTRAMUSCULAR | Status: DC | PRN
  Administered 2013-10-28: 75 ug via INTRAVENOUS
  Administered 2013-10-28: 25 ug via INTRAVENOUS

## 2013-10-28 MED ORDER — GLYCOPYRROLATE 0.2 MG/ML IJ SOLN
INTRAMUSCULAR | Status: AC
  Filled 2013-10-28: qty 1

## 2013-10-28 MED ORDER — HYDROMORPHONE HCL PF 1 MG/ML IJ SOLN: 0.2500 mg | INTRAMUSCULAR | Status: DC | PRN

## 2013-10-28 MED ORDER — SODIUM CHLORIDE 0.9 % IV SOLN
INTRAVENOUS | Status: DC | PRN
  Administered 2013-10-28: 07:00:00

## 2013-10-28 MED ORDER — PROPOFOL 10 MG/ML IV BOLUS
INTRAVENOUS | Status: DC | PRN
  Administered 2013-10-28: 120 mg via INTRAVENOUS

## 2013-10-28 MED ORDER — ROCURONIUM BROMIDE 100 MG/10ML IV SOLN
INTRAVENOUS | Status: DC | PRN
  Administered 2013-10-28: 25 mg via INTRAVENOUS

## 2013-10-28 MED ORDER — METOPROLOL TARTRATE 25 MG PO TABS
25.0000 mg | ORAL_TABLET | Freq: Two times a day (BID) | ORAL | Status: DC
  Administered 2013-10-29 (×2): 25 mg via ORAL
  Filled 2013-10-28 (×5): qty 1

## 2013-10-28 MED ORDER — LACTATED RINGERS IV SOLN
INTRAVENOUS | Status: DC | PRN
  Administered 2013-10-28 (×2): via INTRAVENOUS

## 2013-10-28 MED ORDER — PHENYLEPHRINE 40 MCG/ML (10ML) SYRINGE FOR IV PUSH (FOR BLOOD PRESSURE SUPPORT)
PREFILLED_SYRINGE | INTRAVENOUS | Status: AC
  Filled 2013-10-28: qty 10

## 2013-10-28 MED ORDER — ONDANSETRON HCL 4 MG PO TABS: 4.0000 mg | ORAL_TABLET | Freq: Four times a day (QID) | ORAL | Status: DC | PRN

## 2013-10-28 MED ORDER — SUCCINYLCHOLINE CHLORIDE 20 MG/ML IJ SOLN
INTRAMUSCULAR | Status: AC
  Filled 2013-10-28: qty 1

## 2013-10-28 MED ORDER — BUPIVACAINE-EPINEPHRINE 0.5% -1:200000 IJ SOLN
INTRAMUSCULAR | Status: DC | PRN
  Administered 2013-10-28: 30 mL

## 2013-10-28 MED ORDER — SERTRALINE HCL 50 MG PO TABS
50.0000 mg | ORAL_TABLET | Freq: Every morning | ORAL | Status: DC
  Administered 2013-10-29: 50 mg via ORAL
  Filled 2013-10-28 (×3): qty 1

## 2013-10-28 MED ORDER — FUROSEMIDE 40 MG PO TABS
80.0000 mg | ORAL_TABLET | Freq: Every day | ORAL | Status: DC
  Administered 2013-10-28 – 2013-10-29 (×2): 80 mg via ORAL
  Filled 2013-10-28 (×3): qty 2

## 2013-10-28 MED ORDER — LIDOCAINE HCL (CARDIAC) 20 MG/ML IV SOLN
INTRAVENOUS | Status: AC
  Filled 2013-10-28: qty 5

## 2013-10-28 MED ORDER — ONDANSETRON HCL 4 MG/2ML IJ SOLN: 4.0000 mg | Freq: Four times a day (QID) | INTRAMUSCULAR | Status: DC | PRN

## 2013-10-28 MED ORDER — GLYCOPYRROLATE 0.2 MG/ML IJ SOLN
INTRAMUSCULAR | Status: AC
  Filled 2013-10-28: qty 2

## 2013-10-28 MED ORDER — SODIUM CHLORIDE 0.9 % IJ SOLN
INTRAMUSCULAR | Status: AC
  Filled 2013-10-28: qty 10

## 2013-10-28 MED ORDER — ROCURONIUM BROMIDE 50 MG/5ML IV SOLN
INTRAVENOUS | Status: AC
  Filled 2013-10-28: qty 1

## 2013-10-28 MED ORDER — ONDANSETRON HCL 4 MG/2ML IJ SOLN
INTRAMUSCULAR | Status: DC | PRN
  Administered 2013-10-28: 4 mg via INTRAVENOUS

## 2013-10-28 MED ORDER — AMIODARONE HCL 100 MG PO TABS
100.0000 mg | ORAL_TABLET | Freq: Every day | ORAL | Status: DC
  Administered 2013-10-29: 100 mg via ORAL
  Filled 2013-10-28 (×3): qty 1

## 2013-10-28 MED ORDER — NALOXONE HCL 0.4 MG/ML IJ SOLN
INTRAMUSCULAR | Status: AC
  Filled 2013-10-28: qty 1

## 2013-10-28 MED ORDER — DEXAMETHASONE SODIUM PHOSPHATE 4 MG/ML IJ SOLN
INTRAMUSCULAR | Status: AC
  Filled 2013-10-28: qty 1

## 2013-10-28 MED ORDER — PHENYLEPHRINE HCL 10 MG/ML IJ SOLN
INTRAMUSCULAR | Status: DC | PRN
  Administered 2013-10-28: 120 ug via INTRAVENOUS
  Administered 2013-10-28 (×3): 80 ug via INTRAVENOUS

## 2013-10-28 MED ORDER — MORPHINE SULFATE 2 MG/ML IJ SOLN: 2.0000 mg | INTRAMUSCULAR | Status: DC | PRN

## 2013-10-28 MED ORDER — METOCLOPRAMIDE HCL 5 MG PO TABS
5.0000 mg | ORAL_TABLET | Freq: Every morning | ORAL | Status: DC
  Administered 2013-10-28 – 2013-10-29 (×2): 5 mg via ORAL
  Filled 2013-10-28 (×3): qty 1

## 2013-10-28 MED ORDER — GABAPENTIN 300 MG PO CAPS
300.0000 mg | ORAL_CAPSULE | Freq: Every day | ORAL | Status: DC
  Administered 2013-10-28 – 2013-10-29 (×2): 300 mg via ORAL
  Filled 2013-10-28 (×3): qty 1

## 2013-10-28 MED ORDER — SODIUM CHLORIDE 0.9 % IR SOLN
Status: DC | PRN
  Administered 2013-10-28: 1000 mL

## 2013-10-28 MED ORDER — PROPOFOL 10 MG/ML IV BOLUS
INTRAVENOUS | Status: AC
  Filled 2013-10-28: qty 20

## 2013-10-28 MED ORDER — EPHEDRINE SULFATE 50 MG/ML IJ SOLN
INTRAMUSCULAR | Status: AC
  Filled 2013-10-28: qty 1

## 2013-10-28 MED ORDER — PANTOPRAZOLE SODIUM 40 MG PO TBEC
40.0000 mg | DELAYED_RELEASE_TABLET | Freq: Every day | ORAL | Status: DC
  Administered 2013-10-28 – 2013-10-29 (×2): 40 mg via ORAL
  Filled 2013-10-28 (×2): qty 1

## 2013-10-28 MED ORDER — ALBUTEROL SULFATE (2.5 MG/3ML) 0.083% IN NEBU
INHALATION_SOLUTION | RESPIRATORY_TRACT | Status: AC
  Administered 2013-10-28: 2.5 mg
  Filled 2013-10-28: qty 3

## 2013-10-28 MED ORDER — DEXAMETHASONE SODIUM PHOSPHATE 4 MG/ML IJ SOLN
INTRAMUSCULAR | Status: DC | PRN
  Administered 2013-10-28: 4 mg via INTRAVENOUS

## 2013-10-28 MED ORDER — KCL IN DEXTROSE-NACL 20-5-0.45 MEQ/L-%-% IV SOLN
INTRAVENOUS | Status: DC
  Administered 2013-10-28 – 2013-10-29 (×2): via INTRAVENOUS
  Filled 2013-10-28 (×3): qty 1000

## 2013-10-28 MED ORDER — PROPOFOL 10 MG/ML IV EMUL
5.0000 ug/kg/min | INTRAVENOUS | Status: DC
  Filled 2013-10-28: qty 100

## 2013-10-28 SURGICAL SUPPLY — 50 items
APPLIER CLIP ROT 10 11.4 M/L (STAPLE) ×3
BLADE SURG ROTATE 9660 (MISCELLANEOUS) IMPLANT
CANISTER SUCTION 2500CC (MISCELLANEOUS) ×3 IMPLANT
CHLORAPREP W/TINT 26ML (MISCELLANEOUS) ×3 IMPLANT
CLIP APPLIE ROT 10 11.4 M/L (STAPLE) ×1 IMPLANT
COVER MAYO STAND STRL (DRAPES) ×3 IMPLANT
COVER SURGICAL LIGHT HANDLE (MISCELLANEOUS) ×3 IMPLANT
DECANTER SPIKE VIAL GLASS SM (MISCELLANEOUS) IMPLANT
DERMABOND ADVANCED (GAUZE/BANDAGES/DRESSINGS) ×2
DERMABOND ADVANCED .7 DNX12 (GAUZE/BANDAGES/DRESSINGS) ×1 IMPLANT
DRAPE C-ARM 42X72 X-RAY (DRAPES) ×3 IMPLANT
DRAPE UTILITY 15X26 W/TAPE STR (DRAPE) ×6 IMPLANT
ELECT REM PT RETURN 9FT ADLT (ELECTROSURGICAL) ×3
ELECTRODE REM PT RTRN 9FT ADLT (ELECTROSURGICAL) ×1 IMPLANT
GLOVE BIO SURGEON STRL SZ 6.5 (GLOVE) ×2 IMPLANT
GLOVE BIO SURGEONS STRL SZ 6.5 (GLOVE) ×1
GLOVE BIOGEL PI IND STRL 6.5 (GLOVE) ×1 IMPLANT
GLOVE BIOGEL PI IND STRL 7.0 (GLOVE) ×2 IMPLANT
GLOVE BIOGEL PI IND STRL 8 (GLOVE) ×1 IMPLANT
GLOVE BIOGEL PI INDICATOR 6.5 (GLOVE) ×2
GLOVE BIOGEL PI INDICATOR 7.0 (GLOVE) ×4
GLOVE BIOGEL PI INDICATOR 8 (GLOVE) ×2
GLOVE SS BIOGEL STRL SZ 7.5 (GLOVE) ×1 IMPLANT
GLOVE SUPERSENSE BIOGEL SZ 7.5 (GLOVE) ×2
GLOVE SURG SIGNA 7.5 PF LTX (GLOVE) ×3 IMPLANT
GLOVE SURG SS PI 7.0 STRL IVOR (GLOVE) ×3 IMPLANT
GOWN STRL REUS W/ TWL LRG LVL3 (GOWN DISPOSABLE) ×2 IMPLANT
GOWN STRL REUS W/ TWL XL LVL3 (GOWN DISPOSABLE) ×1 IMPLANT
GOWN STRL REUS W/TWL LRG LVL3 (GOWN DISPOSABLE) ×4
GOWN STRL REUS W/TWL XL LVL3 (GOWN DISPOSABLE) ×2
HEMOSTAT SNOW SURGICEL 2X4 (HEMOSTASIS) ×3 IMPLANT
KIT BASIN OR (CUSTOM PROCEDURE TRAY) ×3 IMPLANT
KIT ROOM TURNOVER OR (KITS) ×3 IMPLANT
NS IRRIG 1000ML POUR BTL (IV SOLUTION) ×3 IMPLANT
PAD ARMBOARD 7.5X6 YLW CONV (MISCELLANEOUS) ×6 IMPLANT
POUCH RETRIEVAL ECOSAC 10 (ENDOMECHANICALS) IMPLANT
POUCH RETRIEVAL ECOSAC 10MM (ENDOMECHANICALS)
POUCH SPECIMEN RETRIEVAL 10MM (ENDOMECHANICALS) ×3 IMPLANT
SCISSORS LAP 5X35 DISP (ENDOMECHANICALS) ×3 IMPLANT
SET CHOLANGIOGRAPH 5 50 .035 (SET/KITS/TRAYS/PACK) ×3 IMPLANT
SET IRRIG TUBING LAPAROSCOPIC (IRRIGATION / IRRIGATOR) ×3 IMPLANT
SLEEVE ENDOPATH XCEL 5M (ENDOMECHANICALS) ×3 IMPLANT
SPECIMEN JAR SMALL (MISCELLANEOUS) IMPLANT
SUT MON AB 5-0 PS2 18 (SUTURE) ×3 IMPLANT
TOWEL OR 17X24 6PK STRL BLUE (TOWEL DISPOSABLE) IMPLANT
TOWEL OR 17X26 10 PK STRL BLUE (TOWEL DISPOSABLE) ×3 IMPLANT
TRAY LAPAROSCOPIC (CUSTOM PROCEDURE TRAY) ×3 IMPLANT
TROCAR XCEL BLUNT TIP 100MML (ENDOMECHANICALS) ×3 IMPLANT
TROCAR XCEL NON-BLD 11X100MML (ENDOMECHANICALS) ×3 IMPLANT
TROCAR XCEL NON-BLD 5MMX100MML (ENDOMECHANICALS) ×3 IMPLANT

## 2013-10-28 NOTE — Consult Note (Signed)
Pt wide awake and wants ETT out. Pt extubated w/o difficulty. Anesthetic drugs are clear. [employer] MD

## 2013-10-28 NOTE — Anesthesia Preprocedure Evaluation (Addendum)
Anesthesia Evaluation  Patient identified by MRN, date of birth, ID band Patient awake    Reviewed: Allergy & Precautions, H&P , NPO status , Patient's Chart, lab work & pertinent test results, reviewed documented beta blocker date and time   History of Anesthesia Complications Negative for: history of anesthetic complications  Airway Mallampati: II TM Distance: >3 FB Neck ROM: Limited    Dental  (+) Teeth Intact, Dental Advisory Given   Pulmonary COPDformer smoker,  Patient states she has a head cold. She is on antibiotics.   + decreased breath sounds      Cardiovascular hypertension, Pt. on medications and Pt. on home beta blockers +CHF + dysrhythmias + pacemaker + Cardiac Defibrillator + Valvular Problems/Murmurs Rhythm:Regular Rate:Normal     Neuro/Psych  Neuromuscular disease    GI/Hepatic PUD, GERD-  Medicated and Controlled,  Endo/Other    Renal/GU Renal InsufficiencyRenal disease     Musculoskeletal  (+) Fibromyalgia -  Abdominal   Peds  Hematology  (+) anemia ,   Anesthesia Other Findings   Reproductive/Obstetrics                         Anesthesia Physical Anesthesia Plan  ASA: III  Anesthesia Plan: General   Post-op Pain Management:    Induction: Intravenous  Airway Management Planned: Oral ETT  Additional Equipment:   Intra-op Plan:   Post-operative Plan: Extubation in OR  Informed Consent: I have reviewed the patients History and Physical, chart, labs and discussed the procedure including the risks, benefits and alternatives for the proposed anesthesia with the patient or authorized representative who has indicated his/her understanding and acceptance.     Plan Discussed with:   Anesthesia Plan Comments:         Anesthesia Quick Evaluation

## 2013-10-28 NOTE — Progress Notes (Signed)
0915-patient was no longer following commands or responding unable to grip or move legs, patient was no longer opening her eyes.  Vitals were still stable. Dr. Linna Caprice was in the recovery room and came to the bedside to evaluate patient. Dr. Linna Caprice started bagging patient. 0930 patient still not improving Dr. Tamala Julian at bedside now and wants to try narcan.  Narcan was given patient still not improving and continued bagging patient. Dr. Tamala Julian wants to intubate. Propofol was given at 0935 by [facility]. Patient intubated around 0940 with subglotic @19  to teeth. Dr. Excell Seltzer was notified by Silvio Clayman. Patient put on ventilator. 7342 patient was given a breathing treatment per respiratory therapist Abigail Butts. 1000 Dr. Excell Seltzer at the bedside evaluating patient with Dr. Tamala Julian. Patient tolerating well. Still not following commands but starting to respond to pain and voice. 1055 Respiratory Therapist starting to wean patient off of the ventilator. Patient put on pressure support. 41 Dr. Tamala Julian at bedside states wants to keep on pressure support for alittle while. Patient following commands and able to grip and move all extremities on own. Report given to Robin RN to take over as primary caregiver in the recovery room.

## 2013-10-28 NOTE — Anesthesia Postprocedure Evaluation (Signed)
  Anesthesia Post-op Note  Patient: [name]  Procedure(s) Performed: Procedure(s): LAPAROSCOPIC CHOLECYSTECTOMY WITH INTRAOPERATIVE CHOLANGIOGRAM (N/A)  Patient Location: PACU  Anesthesia Type:General  Level of Consciousness: sedated and unresponsive  Airway and Oxygen Therapy: Patient re-intubated and Patient placed on Ventilator (see vital sign flow sheet for setting)  Post-op Pain: none  Post-op Assessment: Post-op Vital signs reviewed, Patient's Cardiovascular Status Stable, Respiratory Function Stable, Patent Airway and No signs of Nausea or vomiting  Post-op Vital Signs: unstable  Last Vitals:  Filed Vitals:   10/28/13 1008  BP: 149/51  Pulse: 65  Temp:   Resp: 23    Complications: Patient re-intubatedPt entered PACU w/ adequate ventilation status. After 10-20 min respiratory rate dropped w/ pt decreased mental status. Dr Ervin Knack masked pt. I took over, reversed narcotic and reintubated to suppliment inadequate respirational efforts of pt. Placed pt on vent TV450 /Rate 8/ O2 40% . Pt stable. Will give time to let residual anesthetics reverse. [employer] MD

## 2013-10-28 NOTE — Op Note (Signed)
Preoperative diagnosis: Cholelithiasis and cholecystitis  Postoperative diagnosis: Cholelithiasis and cholecystitis  Surgical procedure: Laparoscopic cholecystectomy with intraoperative cholangiogram  Surgeon: [name] T. Arshia Rondon M.D.  Assistant: Alphonsa Overall  Anesthesia: General Endotracheal  Complications: None  Estimated blood loss: Minimal  Description of procedure: The patient brought to the operating room, placed in the supine position on the operating table, and general endotracheal anesthesia induced. The abdomen was widely sterilely prepped and draped. The patient had received preoperative IV antibiotics and PAS were in place. Patient timeout was performed the correct procedure verified. Standard 4 port technique was used with an open Hassan cannula at the umbilicus and the remainder of the ports placed under direct vision. The gallbladder was visualized. It appeared distended and thin walled. The fundus was grasped and elevated up over the liver and the infundibulum retracted inferiolaterally. Peritoneum anterior and posterior to close triangle was incised and fibrofatty tissue stripped off the neck of the gallbladder toward the porta hepatis. The distal gallbladder was thoroughly dissected. The cystic artery was identified in close triangle and the cystic duct gallbladder junction dissected 360.  A good critical view was obtained. When the anatomy was clear the cystic duct was clipped at the gallbladder junction and an operative cholangiogram obtained through the cystic duct. This showed good filling of a normal common bile duct and intrahepatic ducts with free flow into the duodenum and no filling defects. Following this the Cholangiocath was removed and the cystic duct was doubly clipped proximally and divided. The cystic artery was doubly clipped proximally and distally and divided. The gallbladder was dissected free from its bed using hook cautery and removed through the umbilical  port site. Complete hemostasis was obtained in the gallbladder bed. The right upper quadrant was thoroughly irrigated and hemostasis assured. Trochars were removed and all CO2 evacuated and the [facility] trocar site fascial defect closed. Skin incisions were closed with subcuticular Monocryl and Dermabond. Sponge needle and instrument counts were correct. The patient was taken to PACU in good condition.  Darene Lamer Olen Eaves  10/28/2013

## 2013-10-28 NOTE — H&P (Signed)
HPI  The patient is a 73 year old female referred by Dr. Philip Aspen 4 apparent symptomatic cholelithiasis. The patient states that for the last several months she has had ongoing problems with epigastric abdominal pain. She has a lot of chronic pain in general from arthritis and fibromyalgia and other medical issues. However she has had more severe epigastric pain which is episodic and related to meals. She describes sharp pain in the epigastrium without radiation. She has chronic bowel problems with diarrhea and constipation felt secondary to irritable bowel. These are unchanged. No melena or hematochezia. She had a colonoscopy about 4 years ago.an ultrasound was obtained to evaluate her abdominal pain. This revealed multiple gallstones with no gallbladder wall thickening and normal common bile duct. Also noted was a 2.9 cm hypoechoic region in the hepatic parenchyma. Subsequent CT scan with IV contrast was obtained which showed an ill-defined area of low attenuation in the medial segment of the left hepatic lobe most consistent with fatty infiltration. MRI cannot be performed due to the patient's defibrillator.    Past Medical History   Diagnosis  Date   .  Colon polyp      adenomatous   .  Hemorrhoids    .  GERD (gastroesophageal reflux disease)    .  Hyperlipidemia    .  Bursitis    .  Diverticulosis    .  Anemia    .  MVP (mitral valve prolapse)    .  CHF (congestive heart failure)    .  Arthritis    .  Chronic kidney disease    .  Fibromyalgia    .  COPD (chronic obstructive pulmonary disease)    .  IBS (irritable bowel syndrome)    .  Peptic ulcer disease    .  H/O mitral valve replacement    .  Biventricular ICD (implantable cardioverter-defibrillator) in place      with CRT    Past Surgical History   Procedure  Laterality  Date   .  Abdominal hysterectomy     .  Mitral valve replacement     .  Cardiac defibrillator placement   2007, 2012     x2 and pacemaker    Current  Outpatient Prescriptions   Medication  Sig  Dispense  Refill   .  acetaminophen (TYLENOL) 500 MG tablet  Take 500 mg by mouth as needed (for moderate-severe pain). For pain.     [name]  allopurinol (ZYLOPRIM) 300 MG tablet  Take 300 mg by mouth daily.     [name]  ALPRAZolam (XANAX) 0.25 MG tablet  Take 0.5 mg by mouth at bedtime as needed. For sleep.     [name]  amiodarone (PACERONE) 200 MG tablet  Take 0.5 tablets (100 mg total) by mouth daily.  30 tablet  11   .  DIOVAN 80 MG tablet  TAKE 1 TABLET BY MOUTH DAILY  30 tablet  7   .  Fluticasone-Salmeterol (ADVAIR) 250-50 MCG/DOSE AEPB  Inhale 1 puff into the lungs every 12 (twelve) hours.     .  furosemide (LASIX) 40 MG tablet  TAKE 2 TABLETS BY MOUTH EVERY MORNING AND 1 TAB EVERY AFTERNOON  90 tablet  11   .  gabapentin (NEURONTIN) 300 MG capsule  Take 300 mg by mouth at bedtime.     .  metoCLOPramide (REGLAN) 5 MG tablet  Take 5 mg by mouth daily.     .  metoprolol tartrate (LOPRESSOR) 25 MG tablet  Take 25 mg by mouth 2 (two) times daily.     .  Multiple Vitamins-Minerals (MULTIVITAMINS THER. W/MINERALS) TABS  Take 1 tablet by mouth daily.     [name]  omeprazole (PRILOSEC) 40 MG capsule  TAKE ONE CAPSULE BY MOUTH EVERY DAY  30 capsule  11   .  oxyCODONE-acetaminophen (PERCOCET/ROXICET) 5-325 MG per tablet  Take 1 tablet by mouth 2 (two) times daily.     .  rosuvastatin (CRESTOR) 5 MG tablet  Take 2 tablets (10 mg total) by mouth daily.  35 tablet  0   .  sertraline (ZOLOFT) 25 MG tablet  Take 50 mg by mouth every morning.     [name]  spironolactone (ALDACTONE) 25 MG tablet  TAKE 1 TABLET DAILY.  30 tablet  11   .  Vitamin D, Ergocalciferol, (DRISDOL) 50000 UNITS CAPS  Take 50,000 Units by mouth once a week. On thrusdays.     [name]  warfarin (COUMADIN) 2.5 MG tablet  Take 1 tablet by mouth daily or as directed  90 tablet  1    No current facility-administered medications for this visit.    Allergies   Allergen  Reactions   .  Ace Inhibitors  Cough   .  Lipitor  [Atorvastatin Calcium]  Other (See Comments)     Knee pain.    History   Substance Use Topics   .  Smoking status:  Former Smoker     Types:  Cigarettes     Quit date:  06/02/2001   .  Smokeless tobacco:  Not on file   .  Alcohol Use:  Yes      Comment: social-1 every 6 months   Review of Systems  Constitutional: Negative.  Respiratory: Negative for shortness of breath and wheezing.  Cardiovascular: Negative.  Gastrointestinal: Positive for nausea, abdominal pain, diarrhea and constipation. Negative for blood in stool and anal bleeding.  Genitourinary: Negative.  Musculoskeletal: Positive for arthralgias and myalgias.  Objective:   Physical Exam  BP 106/46  Pulse 61  Temp(Src) 98.8 F (37.1 C) (Oral)  Resp 18  Wt 125 lb (56.7 kg)  SpO2 95%   General: Alert, Elderly Caucasian female, in no distress  Skin: Warm and dry without rash or infection.  HEENT: No palpable masses or thyromegaly. Sclera nonicteric. Pupils equal round and reactive. Oropharynx clear.  Lungs: Breath sounds clear and equal without increased work of breathing  Cardiovascular:healed sternotomy. 2/6 systolic murmur with crisp valve click. No edema. Peripheral pulses intact.  Abdomen: Nondistended. Moderate localized epigastric tenderness and mild diffuse tenderness. No masses palpable. No organomegaly. No palpable hernias.  Extremities: No edema or joint swelling or deformity. No chronic venous stasis changes.  Neurologic: Alert and fully oriented. Gait normal.   Assessment:   Symptomatic cholelithiasis/cholecystitis. She has multiple other medical issues including significant cardiac history but her cardiac function seems good and my impression that she would be a reasonable operative risk. I recommended proceeding with laparoscopic cholecystectomy with cholangiogram in an attempt to relieve her symptoms and prevent future complications.I discussed the procedure in detail. The patient was given Medical sales representative. We discussed the risks and benefits of a laparoscopic cholecystectomy and possible cholangiogram including, but not limited to bleeding, infection, injury to surrounding structures such as the intestine or liver, bile leak, retained gallstones, need to convert to an open procedure, prolonged diarrhea, blood clots such as DVT, common bile duct injury, anesthesia risks, and possible need for additional procedures. The likelihood of  improvement in symptoms and return to the patient's normal status is good. We discussed the typical post-operative recovery course.we will get preoperative clearance from Dr. Gwenlyn Found and recommendations on management of her anticoagulation.   Plan:   Laparoscopic cholecystectomy with cholangiogram overnight hospitalization and we will obtain cardiac clearance and recommendations for management of her anticoagulation.

## 2013-10-28 NOTE — Addendum Note (Signed)
Addendum created 10/28/13 1158 by Kate Sable, MD   Modules edited: Clinical Notes   Clinical Notes:  File: [id number]

## 2013-10-28 NOTE — Transfer of Care (Signed)
Immediate Anesthesia Transfer of Care Note  Patient: [name]  Procedure(s) Performed: Procedure(s): LAPAROSCOPIC CHOLECYSTECTOMY WITH INTRAOPERATIVE CHOLANGIOGRAM (N/A)  Patient Location: PACU  Anesthesia Type:General  Level of Consciousness: awake, oriented and patient cooperative  Airway & Oxygen Therapy: Patient Spontanous Breathing and Patient connected to nasal cannula oxygen  Post-op Assessment: Report given to PACU RN and Post -op Vital signs reviewed and stable  Post vital signs: Reviewed  Complications: No apparent anesthesia complications

## 2013-10-28 NOTE — Anesthesia Procedure Notes (Signed)
Procedure Name: Intubation Date/Time: 10/28/2013 7:23 AM Performed by: Jenne Campus Pre-anesthesia Checklist: Patient identified, Emergency Drugs available, Suction available, Patient being monitored and Timeout performed Patient Re-evaluated:Patient Re-evaluated prior to inductionOxygen Delivery Method: Circle system utilized Preoxygenation: Pre-oxygenation with 100% oxygen Intubation Type: IV induction Ventilation: Mask ventilation without difficulty Laryngoscope Size: Miller and 2 Grade View: Grade I Tube type: Oral Tube size: 7.0 mm Number of attempts: 1 Airway Equipment and Method: Stylet Placement Confirmation: ETT inserted through vocal cords under direct vision,  positive ETCO2,  CO2 detector and breath sounds checked- equal and bilateral Secured at: 20 cm Tube secured with: Tape Dental Injury: Teeth and Oropharynx as per pre-operative assessment

## 2013-10-28 NOTE — Procedures (Signed)
Extubation Procedure Note  Patient Details:   Name: [name] DOB: 07/14/1940 MRN: [id number]   Airway Documentation:     Evaluation  O2 sats: stable throughout Complications: No apparent complications Patient did tolerate procedure well. Bilateral Breath Sounds: Expiratory wheezes;Diminished;Rales Suctioning: Oral;Airway Yes Pt. weaning very well since 10:50 on 5p/8ps/30% fi02, T.O. Per Dr. Kate Sable to extubate, pt. able to lift head off bed on own, NIF-(-20)cmh20, FVC-(.85)L, (+) cuff leak, suctioned ET/oropharynx prior to extubation, has subglottic ET tube in place/connected to intermittant suction, no Stridor noted s/p procedure with no complications, placed on 4 lpm n/c, RN @ bedside to monitor, starting I/S. Dia Crawford 10/28/2013, 11:55 AM

## 2013-10-29 LAB — BASIC METABOLIC PANEL
BUN: 15 mg/dL (ref 6–23)
CHLORIDE: 92 meq/L — AB (ref 96–112)
CO2: 25 meq/L (ref 19–32)
Calcium: 8.8 mg/dL (ref 8.4–10.5)
Creatinine, Ser: 1.68 mg/dL — ABNORMAL HIGH (ref 0.50–1.10)
GFR calc Af Amer: 34 mL/min — ABNORMAL LOW (ref >90)
GFR, EST NON AFRICAN AMERICAN: 30 mL/min — AB (ref >90)
Glucose, Bld: 103 mg/dL — ABNORMAL HIGH (ref 70–99)
Potassium: 4.9 mEq/L (ref 3.7–5.3)
SODIUM: 128 meq/L — AB (ref 137–147)

## 2013-10-29 LAB — CBC
HCT: 23 % — ABNORMAL LOW (ref 36.0–46.0)
HEMOGLOBIN: 7.9 g/dL — AB (ref 12.0–15.0)
MCH: 30.9 pg (ref 26.0–34.0)
MCHC: 34.3 g/dL (ref 30.0–36.0)
MCV: 89.8 fL (ref 78.0–100.0)
Platelets: 221 10*3/uL (ref 150–400)
RBC: 2.56 MIL/uL — AB (ref 3.87–5.11)
RDW: 15.5 % (ref 11.5–15.5)
WBC: 9.1 10*3/uL (ref 4.0–10.5)

## 2013-10-29 MED ORDER — WARFARIN SODIUM 5 MG PO TABS
5.0000 mg | ORAL_TABLET | Freq: Every day | ORAL | Status: AC
  Administered 2013-10-29: 5 mg via ORAL
  Filled 2013-10-29 (×3): qty 1

## 2013-10-29 MED ORDER — WARFARIN - PHYSICIAN DOSING INPATIENT: Freq: Every day | Status: DC

## 2013-10-29 MED ORDER — ENOXAPARIN SODIUM 30 MG/0.3ML ~~LOC~~ SOLN
30.0000 mg | SUBCUTANEOUS | Status: DC
  Administered 2013-10-29: 30 mg via SUBCUTANEOUS
  Filled 2013-10-29 (×2): qty 0.3

## 2013-10-29 NOTE — Progress Notes (Signed)
Patient ID: [name], female   DOB: 02-20-1941, 73 y.o.   MRN: [id number] [facility] Surgery Progress Note:   1 Day Post-Op  Subjective: Mental status is clear.  Sitting up eating breakfast.   Objective: Vital signs in last 24 hours: Temp:  [97.7 F (36.5 C)-98.6 F (37 C)] 98.1 F (36.7 C) (05/30 0604) Pulse Rate:  [59-91] 91 (05/30 0604) Resp:  [13-25] 17 (05/30 0604) BP: (94-149)/(42-72) 102/59 mmHg (05/30 0604) SpO2:  [90 %-100 %] 93 % (05/30 0604) FiO2 (%):  [30 %-40 %] 30 % (05/29 1100) Weight:  [125 lb (56.7 kg)] 125 lb (56.7 kg) (05/29 2300)  Intake/Output from previous day: 05/29 0701 - 05/30 0700 In: 2561.7 [P.O.:1000; I.V.:1561.7] Out: 1900 [Urine:1900] Intake/Output this shift: Total I/O In: -  Out: 350 [Urine:350]  Physical Exam: Work of breathing is not labored but expiratory wheezes.  Coughs with deep inspiration.  Required reintubation after lap chole   Lab Results:  Results for orders placed during the hospital encounter of 10/28/13 (from the past 48 hour(s))  APTT     Status: Abnormal   Collection Time    10/28/13  6:06 AM      Result Value Ref Range   aPTT 47 (*) 24 - 37 seconds   Comment:            IF BASELINE aPTT IS ELEVATED,     SUGGEST PATIENT RISK ASSESSMENT     BE USED TO DETERMINE APPROPRIATE     ANTICOAGULANT THERAPY.  PROTIME-INR     Status: Abnormal   Collection Time    10/28/13  6:06 AM      Result Value Ref Range   Prothrombin Time 17.8 (*) 11.6 - 15.2 seconds   INR 1.51 (*) 0.00 - 1.49  CBC     Status: Abnormal   Collection Time    10/29/13  4:31 AM      Result Value Ref Range   WBC 9.1  4.0 - 10.5 K/uL   RBC 2.56 (*) 3.87 - 5.11 MIL/uL   Hemoglobin 7.9 (*) 12.0 - 15.0 g/dL   HCT 23.0 (*) 36.0 - 46.0 %   MCV 89.8  78.0 - 100.0 fL   MCH 30.9  26.0 - 34.0 pg   MCHC 34.3  30.0 - 36.0 g/dL   RDW 15.5  11.5 - 15.5 %   Platelets 221  150 - 400 K/uL  BASIC METABOLIC PANEL     Status: Abnormal   Collection Time     10/29/13  4:31 AM      Result Value Ref Range   Sodium 128 (*) 137 - 147 mEq/L   Potassium 4.9  3.7 - 5.3 mEq/L   Chloride 92 (*) 96 - 112 mEq/L   CO2 25  19 - 32 mEq/L   Glucose, Bld 103 (*) 70 - 99 mg/dL   BUN 15  6 - 23 mg/dL   Creatinine, Ser 1.68 (*) 0.50 - 1.10 mg/dL   Calcium 8.8  8.4 - 10.5 mg/dL   GFR calc non Af Amer 30 (*) >90 mL/min   GFR calc Af Amer 34 (*) >90 mL/min   Comment: (NOTE)     The eGFR has been calculated using the CKD EPI equation.     This calculation has not been validated in all clinical situations.     eGFR's persistently <90 mL/min signify possible Chronic Kidney     Disease.    Radiology/Results: Dg Cholangiogram Operative  10/28/2013  CLINICAL DATA:  Laparoscopic cholecystectomy  EXAM: INTRAOPERATIVE CHOLANGIOGRAM  FLUOROSCOPY TIME:  26 seconds  COMPARISON:  CT abdomen and pelvis- 09/01/2013  FINDINGS: Intraoperative angiographic images of the right upper abdominal quadrant during laparoscopic cholecystectomy are provided for review.  Surgical clips overlie the expected location of the gallbladder fossa.  Contrast injection demonstrates selective cannulation of the central aspect of the cystic duct.  There is passage of contrast through the central aspect of the cystic duct with filling of a non dilated common bile duct. There is passage of contrast though the CBD with faint opacification of the descending portion of the duodenum.  There is minimal reflux of injected contrast into the common hepatic duct and central aspect of the non dilated intrahepatic biliary system.  There are no discrete filling defects within the opacified portions of the biliary system to suggest the presence of choledocholithiasis.  IMPRESSION: No evidence of choledocholithiasis.   Electronically Signed   By: Sandi Mariscal M.D.   On: 10/28/2013 08:43   Dg Chest Port 1 View  10/28/2013   CLINICAL DATA:  Intubation.  EXAM: PORTABLE CHEST - 1 VIEW  COMPARISON:  10/19/13  FINDINGS:  Endotracheal tube terminates 3.6 cm above carina. Pacer/AICD device. Median sternotomy for prior mitral valve repair.  Heart size upper normal. No pleural effusion or pneumothorax. Patchy left greater than right bibasilar airspace disease. Right perihilar airspace disease as well. Mild interstitial prominence and indistinctness with thickening of the right minor fissure.  IMPRESSION: Appropriate position of endotracheal tube.  Suspect mild interstitial edema.  New.  Patchy bilateral airspace disease which could represent alveolar edema or concurrent infection/aspiration.   Electronically Signed   By: Abigail Miyamoto M.D.   On: 10/28/2013 10:30    Anti-infectives: Anti-infectives   Start     Dose/Rate Route Frequency Ordered Stop   10/28/13 0600  ceFAZolin (ANCEF) IVPB 2 g/50 mL premix     2 g 100 mL/hr over 30 Minutes Intravenous On call to O.R. 10/27/13 1409 10/28/13 0722      Assessment/Plan: Problem List: Patient Active Problem List   Diagnosis Date Noted  . Cholelithiasis with cholecystitis 10/28/2013  . Long term (current) use of anticoagulants 08/19/2012  . Biventricular ICD (implantable cardioverter-defibrillator) in place 04/10/2011  . History of mitral valve replacement with mechanical valve 04/10/2011  . CHEST PAIN 01/24/2010  . IMPLANTATION OF DEFIBRILLATOR, HX OF 01/24/2010  . RECTAL BLEEDING 08/01/2009  . NAUSEA AND VOMITING 08/01/2009  . UTI'S, HX OF 08/01/2009  . PERSONAL HX COLONIC POLYPS 08/01/2008  . HYPERLIPIDEMIA 07/31/2008  . ANEMIA, CHRONIC 07/31/2008  . INTERNAL HEMORRHOIDS 07/31/2008  . GERD 07/31/2008  . DIVERTICULOSIS, COLON 07/31/2008  . BURSITIS 07/31/2008    Doing OK from lap chole but underlying acute respiratory condition on top of COPD.  Have asked respiratory therapy for assessment and recommendation for breathing treatments.  Not ready for discharge today.   1 Day Post-Op    LOS: 1 day   Matt B. Hassell Done, MD, [facility] [phone] beeper [phone]  10/29/2013 9:11 AM

## 2013-10-29 NOTE — Progress Notes (Signed)
Communicated with Dr. Excell Seltzer who stated that it was okay to start her Lovenox/coumadin bridge.  [name]. Dahlia Bailiff, MD, [place] [phone] [facility]

## 2013-10-30 LAB — CBC
HCT: 23.6 % — ABNORMAL LOW (ref 36.0–46.0)
Hemoglobin: 8.1 g/dL — ABNORMAL LOW (ref 12.0–15.0)
MCH: 30.5 pg (ref 26.0–34.0)
MCHC: 34.3 g/dL (ref 30.0–36.0)
MCV: 88.7 fL (ref 78.0–100.0)
Platelets: 240 10*3/uL (ref 150–400)
RBC: 2.66 MIL/uL — AB (ref 3.87–5.11)
RDW: 15.3 % (ref 11.5–15.5)
WBC: 9.3 10*3/uL (ref 4.0–10.5)

## 2013-10-30 LAB — BASIC METABOLIC PANEL
BUN: 14 mg/dL (ref 6–23)
CALCIUM: 8.9 mg/dL (ref 8.4–10.5)
CO2: 25 mEq/L (ref 19–32)
Chloride: 86 mEq/L — ABNORMAL LOW (ref 96–112)
Creatinine, Ser: 1.39 mg/dL — ABNORMAL HIGH (ref 0.50–1.10)
GFR calc Af Amer: 43 mL/min — ABNORMAL LOW (ref >90)
GFR calc non Af Amer: 37 mL/min — ABNORMAL LOW (ref >90)
GLUCOSE: 85 mg/dL (ref 70–99)
Potassium: 4.1 mEq/L (ref 3.7–5.3)
SODIUM: 124 meq/L — AB (ref 137–147)

## 2013-10-30 LAB — PROTIME-INR
INR: 1.24 (ref 0.00–1.49)
Prothrombin Time: 15.3 seconds — ABNORMAL HIGH (ref 11.6–15.2)

## 2013-10-30 NOTE — Discharge Summary (Signed)
Patient given discharge paperwork.

## 2013-10-30 NOTE — Discharge Instructions (Signed)
Laparoscopic Cholecystectomy Laparoscopic cholecystectomy is surgery to remove the gallbladder. The gallbladder is located in the upper right part of the abdomen, behind the liver. It is a storage sac for bile produced in the liver. Bile aids in the digestion and absorption of fats. Cholecystectomy is often done for inflammation of the gallbladder (cholecystitis). This condition is usually caused by a buildup of gallstones (cholelithiasis) in your gallbladder. Gallstones can block the flow of bile, resulting in inflammation and pain. In severe cases, emergency surgery may be required. When emergency surgery is not required, you will have time to prepare for the procedure. Laparoscopic surgery is an alternative to open surgery. Laparoscopic surgery has a shorter recovery time. Your common bile duct may also need to be examined during the procedure. If stones are found in the common bile duct, they may be removed. LET [facility] CARE PROVIDER KNOW ABOUT:  Any allergies you have.  All medicines you are taking, including vitamins, herbs, eye drops, creams, and over-the-counter medicines.  Previous problems you or members of your family have had with the use of anesthetics.  Any blood disorders you have.  Previous surgeries you have had.  Medical conditions you have. RISKS AND COMPLICATIONS Generally, this is a safe procedure. However, as with any procedure, complications can occur. Possible complications include:  Infection.  Damage to the common bile duct, nerves, arteries, veins, or other internal organs such as the stomach, liver, or intestines.  Bleeding.  A stone may remain in the common bile duct.  A bile leak from the cyst duct that is clipped when your gallbladder is removed.  The need to convert to open surgery, which requires a larger incision in the abdomen. This may be necessary if your surgeon thinks it is not safe to continue with a laparoscopic procedure. BEFORE THE  PROCEDURE  Ask your health care provider about changing or stopping any regular medicines. You will need to stop taking aspirin or blood thinners at least 5 days prior to surgery.  Do not eat or drink anything after midnight the night before surgery.  Let your health care provider know if you develop a cold or other infectious problem before surgery. PROCEDURE   You will be given medicine to make you sleep through the procedure (general anesthetic). A breathing tube will be placed in your mouth.  When you are asleep, your surgeon will make several small cuts (incisions) in your abdomen.  A thin, lighted tube with a tiny camera on the end (laparoscope) is inserted through one of the small incisions. The camera on the laparoscope sends a picture to a TV screen in the operating room. This gives the surgeon a good view inside your abdomen.  A gas will be pumped into your abdomen. This expands your abdomen so that the surgeon has more room to perform the surgery.  Other tools needed for the procedure are inserted through the other incisions. The gallbladder is removed through one of the incisions.  After the removal of your gallbladder, the incisions will be closed with stitches, staples, or skin glue. AFTER THE PROCEDURE  You will be taken to a recovery area where your progress will be checked often.  You may be allowed to go home the same day if your pain is controlled and you can tolerate liquids. Document Released: 05/19/2005 Document Revised: 03/09/2013 Document Reviewed: 12/29/2012 [facility] Patient Information 2014 [place].

## 2013-10-30 NOTE — Discharge Summary (Signed)
Physician Discharge Summary  Patient ID: [name] MRN: [id number] DOB/AGE: July 18, 1940 73 y.o.  Admit date: 10/28/2013 Discharge date: 10/30/2013  Admission Diagnoses:  cholecystitis  Discharge Diagnoses:  same  Active Problems:   Cholelithiasis with cholecystitis   Surgery:  Laparoscopic cholecystectomy  Discharged Condition: improved  Hospital Course:   Had surgery; required short term reintubation in the PACU;  Extubated and managed on 6N.  Had some resolving airway congestion that kept her in an extra day.  Ready for discharge to resume Lovenox bridge to anticoagulation.  Consults: pharmacy  Significant Diagnostic Studies: none    Discharge Exam: Blood pressure 126/54, pulse 63, temperature 98.4 F (36.9 C), temperature source Oral, resp. rate 16, height 5' 3"  (1.6 m), weight 125 lb (56.7 kg), SpO2 93.00%. Incisions OK.  Upper airway congestion better  Disposition: 01-Home or Self Care  Discharge Instructions   Diet - low sodium heart healthy    Complete by:  As directed      Discharge instructions    Complete by:  As directed   followup with primary care physician regarding anticoagulation levels     Increase activity slowly    Complete by:  As directed      No dressing needed    Complete by:  As directed             Medication List         allopurinol 300 MG tablet  Commonly known as:  ZYLOPRIM  Take 300 mg by mouth every evening.     ALPRAZolam 0.25 MG tablet  Commonly known as:  XANAX  Take 0.5 mg by mouth at bedtime.     amiodarone 200 MG tablet  Commonly known as:  PACERONE  Take 0.5 tablets (100 mg total) by mouth daily.     enoxaparin 30 MG/0.3ML injection  Commonly known as:  LOVENOX  Inject 1 syring (49m) subcutaneously once daily as directed     furosemide 40 MG tablet  Commonly known as:  LASIX  Take 40-80 mg by mouth 2 (two) times daily. 2 tabs in the morning and 1 tab in the evening     gabapentin 300 MG capsule  Commonly  known as:  NEURONTIN  Take 300 mg by mouth at bedtime.     metoCLOPramide 5 MG tablet  Commonly known as:  REGLAN  Take 5 mg by mouth every morning.     metoprolol tartrate 25 MG tablet  Commonly known as:  LOPRESSOR  Take 25 mg by mouth 2 (two) times daily.     multivitamins ther. w/minerals Tabs tablet  Take 1 tablet by mouth every morning.     omeprazole 40 MG capsule  Commonly known as:  PRILOSEC  Take 40 mg by mouth daily before breakfast.     oxyCODONE-acetaminophen 5-325 MG per tablet  Commonly known as:  PERCOCET/ROXICET  Take 1 tablet by mouth 2 (two) times daily.     rosuvastatin 10 MG tablet  Commonly known as:  CRESTOR  Take 10 mg by mouth every evening.     sertraline 25 MG tablet  Commonly known as:  ZOLOFT  Take 50 mg by mouth every morning.     spironolactone 25 MG tablet  Commonly known as:  ALDACTONE  Take 25 mg by mouth every morning.     valsartan 80 MG tablet  Commonly known as:  DIOVAN  Take 1 tablet (80 mg total) by mouth daily.     Vitamin D (Ergocalciferol) 50000  UNITS Caps capsule  Commonly known as:  DRISDOL  Take 50,000 Units by mouth every Thursday.     warfarin 2.5 MG tablet  Commonly known as:  COUMADIN  Take 2.5 mg by mouth daily at 6 PM.           Follow-up Information   Follow up with HOXWORTH,BENJAMIN T, MD. Schedule an appointment as soon as possible for a visit in 3 weeks.   Specialty:  General Surgery   Contact information:   [address] [place] [address] [phone]       Signed: Pedro Earls 10/30/2013, 8:17 AM

## 2013-10-31 ENCOUNTER — Ambulatory Visit (INDEPENDENT_AMBULATORY_CARE_PROVIDER_SITE_OTHER): Payer: Medicare Other | Admitting: Pharmacist Clinician (PhC)/ Clinical Pharmacy Specialist

## 2013-10-31 ENCOUNTER — Encounter (HOSPITAL_COMMUNITY): Payer: Self-pay | Admitting: General Surgery

## 2013-10-31 DIAGNOSIS — Z7901 Long term (current) use of anticoagulants: Secondary | ICD-10-CM

## 2013-10-31 DIAGNOSIS — Z954 Presence of other heart-valve replacement: Secondary | ICD-10-CM

## 2013-10-31 DIAGNOSIS — Z952 Presence of prosthetic heart valve: Secondary | ICD-10-CM

## 2013-10-31 LAB — POCT INR: INR: 2.5

## 2013-11-04 ENCOUNTER — Ambulatory Visit: Payer: Medicare Other | Admitting: Pharmacist Clinician (PhC)/ Clinical Pharmacy Specialist

## 2013-11-09 ENCOUNTER — Ambulatory Visit (INDEPENDENT_AMBULATORY_CARE_PROVIDER_SITE_OTHER): Payer: Medicare Other | Admitting: General Surgery

## 2013-11-09 ENCOUNTER — Encounter (INDEPENDENT_AMBULATORY_CARE_PROVIDER_SITE_OTHER): Payer: Self-pay | Admitting: General Surgery

## 2013-11-09 ENCOUNTER — Other Ambulatory Visit: Payer: Self-pay

## 2013-11-09 VITALS — BP 128/76 | HR 80 | Temp 97.6°F | Ht 62.0 in | Wt 120.0 lb

## 2013-11-09 DIAGNOSIS — Z9889 Other specified postprocedural states: Secondary | ICD-10-CM

## 2013-11-09 DIAGNOSIS — Z09 Encounter for follow-up examination after completed treatment for conditions other than malignant neoplasm: Secondary | ICD-10-CM

## 2013-11-09 DIAGNOSIS — Z9049 Acquired absence of other specified parts of digestive tract: Secondary | ICD-10-CM

## 2013-11-09 DIAGNOSIS — Z1231 Encounter for screening mammogram for malignant neoplasm of breast: Secondary | ICD-10-CM

## 2013-11-09 LAB — CBC WITH DIFFERENTIAL/PLATELET
Basophils Absolute: 0 10*3/uL (ref 0.0–0.1)
Basophils Relative: 0 % (ref 0–1)
Eosinophils Absolute: 0.1 10*3/uL (ref 0.0–0.7)
Eosinophils Relative: 1 % (ref 0–5)
HCT: 33.7 % — ABNORMAL LOW (ref 36.0–46.0)
Hemoglobin: 11.2 g/dL — ABNORMAL LOW (ref 12.0–15.0)
LYMPHS ABS: 1.3 10*3/uL (ref 0.7–4.0)
Lymphocytes Relative: 9 % — ABNORMAL LOW (ref 12–46)
MCH: 29.7 pg (ref 26.0–34.0)
MCHC: 33.2 g/dL (ref 30.0–36.0)
MCV: 89.4 fL (ref 78.0–100.0)
MONOS PCT: 6 % (ref 3–12)
Monocytes Absolute: 0.9 10*3/uL (ref 0.1–1.0)
NEUTROS ABS: 12 10*3/uL — AB (ref 1.7–7.7)
Neutrophils Relative %: 84 % — ABNORMAL HIGH (ref 43–77)
Platelets: 465 10*3/uL — ABNORMAL HIGH (ref 150–400)
RBC: 3.77 MIL/uL — AB (ref 3.87–5.11)
RDW: 16.1 % — ABNORMAL HIGH (ref 11.5–15.5)
WBC: 14.3 10*3/uL — ABNORMAL HIGH (ref 4.0–10.5)

## 2013-11-09 NOTE — Progress Notes (Signed)
Chief complaint: Followup cholecystectomy  History: Patient returned to the office following laparoscopic cholecystectomy approximately 2 weeks ago. She stayed in the hospital a couple of days after she required reintubation in the recovery room but was extubated before she left recovery room and did fine after this. She states she feels "better than she has in 6 months". Denies any abdominal pain or GI complaints which is a distinct improvement compared to preop. Of note is her hemoglobin did drop to 7.9 on one check postoperatively in the hospital. She feels well. She is back on her regular Coumadin dosage.  Exam: BP 128/76  Pulse 80  Temp(Src) 97.6 F (36.4 C)  Ht 5' 2"  (1.575 m)  Wt 120 lb (54.432 kg)  BMI 21.94 kg/m2 General: Appears well Abdomen: Soft and nontender. Wounds well healed  Pathology confirmed chronic cholecystitis and stones  Assessment and plan: Doing well following the above procedure. I would like to recheck her hemoglobin to make sure this is coming back up. I do not see any evidence of complications. At this point she's discharged from my care to return as needed.

## 2013-11-14 ENCOUNTER — Ambulatory Visit: Payer: Medicare Other | Admitting: Pharmacist Clinician (PhC)/ Clinical Pharmacy Specialist

## 2013-11-16 ENCOUNTER — Encounter (INDEPENDENT_AMBULATORY_CARE_PROVIDER_SITE_OTHER): Payer: Self-pay

## 2013-11-16 ENCOUNTER — Ambulatory Visit (INDEPENDENT_AMBULATORY_CARE_PROVIDER_SITE_OTHER): Payer: Medicare Other | Admitting: Pharmacist Clinician (PhC)/ Clinical Pharmacy Specialist

## 2013-11-16 DIAGNOSIS — Z952 Presence of prosthetic heart valve: Secondary | ICD-10-CM

## 2013-11-16 DIAGNOSIS — Z7901 Long term (current) use of anticoagulants: Secondary | ICD-10-CM

## 2013-11-16 DIAGNOSIS — Z954 Presence of other heart-valve replacement: Secondary | ICD-10-CM

## 2013-11-16 LAB — POCT INR: INR: 2.3

## 2013-11-28 ENCOUNTER — Telehealth: Payer: Self-pay | Admitting: Cardiovascular Disease

## 2013-11-28 NOTE — Telephone Encounter (Signed)
Patient needs SBE prophylaxis. She is on warfarin for mechanical mitral valve. Amoxicillin is appropriate

## 2013-11-28 NOTE — Telephone Encounter (Signed)
Dr Berry, please advise 

## 2013-11-28 NOTE — Telephone Encounter (Signed)
Patient states she has been having trouble with her teeth. She states they are rotting from inside out and following out.  Lost a tooth last week and 2 more another day.  Patient states her Dr Luretha Rued will not fill any more antibiotic for any of his patient. Patient has mechanical mitral valve  Dr Luretha Rued is requesting clearance for teeth extraction . He will be pulling up to 4 teeth at the time. She also need a prescription for an antibiotic with several refills.  She usually  -use AMOXICILLIN (LESS REACTION WITH HER USE OF WARFARIN PER PATIENT) She states has discussed it with [facility]  Patient aware will defer to Dr Gwenlyn Found

## 2013-11-28 NOTE — Telephone Encounter (Signed)
Wants to talk a nurse regarding antibiotics for dental procedures . Wants to discuss this.  Please call

## 2013-11-29 MED ORDER — AMOXICILLIN 500 MG PO CAPS: ORAL_CAPSULE | ORAL | Status: DC

## 2013-11-29 NOTE — Telephone Encounter (Signed)
INFORMED PATIENT LETTER SENT TO DR MANGO--FAXED [phone] E-SENT PRESCRIPTION  TO PHARMACY

## 2013-11-29 NOTE — Telephone Encounter (Signed)
Returning your call. °

## 2013-11-29 NOTE — Telephone Encounter (Signed)
Called left message to call back 

## 2013-12-05 LAB — MDC_IDC_ENUM_SESS_TYPE_REMOTE
Battery Voltage: 3.05 V
HighPow Impedance: 83 Ohm
Lead Channel Impedance Value: 513 Ohm
Lead Channel Pacing Threshold Pulse Width: 0.4 ms
Lead Channel Pacing Threshold Pulse Width: 0.4 ms
Lead Channel Sensing Intrinsic Amplitude: 1.5 mV
Lead Channel Sensing Intrinsic Amplitude: 20 mV
Lead Channel Setting Pacing Amplitude: 1.5 V
Lead Channel Setting Pacing Amplitude: 1.75 V
Lead Channel Setting Pacing Amplitude: 2.25 V
Lead Channel Setting Pacing Pulse Width: 0.4 ms
Lead Channel Setting Pacing Pulse Width: 0.4 ms
Lead Channel Setting Sensing Sensitivity: 0.3 mV
MDC IDC MSMT LEADCHNL LV IMPEDANCE VALUE: 475 Ohm
MDC IDC MSMT LEADCHNL LV PACING THRESHOLD AMPLITUDE: 1.125 V
MDC IDC MSMT LEADCHNL RA IMPEDANCE VALUE: 399 Ohm
MDC IDC MSMT LEADCHNL RA PACING THRESHOLD AMPLITUDE: 0.625 V
MDC IDC MSMT LEADCHNL RA PACING THRESHOLD PULSEWIDTH: 0.4 ms
MDC IDC MSMT LEADCHNL RV PACING THRESHOLD AMPLITUDE: 1 V
MDC IDC SET ZONE DETECTION INTERVAL: 300 ms
MDC IDC SET ZONE DETECTION INTERVAL: 350.8 ms
MDC IDC SET ZONE DETECTION INTERVAL: 451.1 ms
Zone Setting Detection Interval: 340.9 ms
Zone Setting Detection Interval: 350.8 ms

## 2013-12-07 ENCOUNTER — Ambulatory Visit (INDEPENDENT_AMBULATORY_CARE_PROVIDER_SITE_OTHER): Payer: Medicare Other | Admitting: Pharmacist

## 2013-12-07 DIAGNOSIS — Z954 Presence of other heart-valve replacement: Secondary | ICD-10-CM

## 2013-12-07 DIAGNOSIS — Z7901 Long term (current) use of anticoagulants: Secondary | ICD-10-CM

## 2013-12-07 DIAGNOSIS — Z952 Presence of prosthetic heart valve: Secondary | ICD-10-CM

## 2013-12-07 LAB — POCT INR: INR: 3

## 2013-12-13 ENCOUNTER — Encounter: Payer: Self-pay | Admitting: Cardiology

## 2013-12-19 ENCOUNTER — Encounter: Payer: Self-pay | Admitting: Cardiovascular Disease

## 2014-01-04 ENCOUNTER — Ambulatory Visit (INDEPENDENT_AMBULATORY_CARE_PROVIDER_SITE_OTHER): Payer: Medicare Other | Admitting: Pharmacist Clinician (PhC)/ Clinical Pharmacy Specialist

## 2014-01-04 DIAGNOSIS — Z952 Presence of prosthetic heart valve: Secondary | ICD-10-CM

## 2014-01-04 DIAGNOSIS — Z954 Presence of other heart-valve replacement: Secondary | ICD-10-CM

## 2014-01-04 DIAGNOSIS — Z7901 Long term (current) use of anticoagulants: Secondary | ICD-10-CM

## 2014-01-04 LAB — POCT INR: INR: 2.1

## 2014-01-13 ENCOUNTER — Encounter: Payer: Self-pay | Admitting: *Deleted

## 2014-01-19 ENCOUNTER — Ambulatory Visit (INDEPENDENT_AMBULATORY_CARE_PROVIDER_SITE_OTHER): Payer: Medicare Other | Admitting: Cardiovascular Disease

## 2014-01-19 ENCOUNTER — Ambulatory Visit (INDEPENDENT_AMBULATORY_CARE_PROVIDER_SITE_OTHER): Payer: Medicare Other | Admitting: *Deleted

## 2014-01-19 VITALS — BP 102/54 | HR 73 | Resp 16 | Ht 62.0 in | Wt 121.9 lb

## 2014-01-19 DIAGNOSIS — Z79899 Other long term (current) drug therapy: Secondary | ICD-10-CM

## 2014-01-19 DIAGNOSIS — Z954 Presence of other heart-valve replacement: Secondary | ICD-10-CM

## 2014-01-19 DIAGNOSIS — Z95810 Presence of automatic (implantable) cardiac defibrillator: Secondary | ICD-10-CM

## 2014-01-19 DIAGNOSIS — Z7901 Long term (current) use of anticoagulants: Secondary | ICD-10-CM

## 2014-01-19 DIAGNOSIS — I050 Rheumatic mitral stenosis: Secondary | ICD-10-CM

## 2014-01-19 DIAGNOSIS — Z952 Presence of prosthetic heart valve: Secondary | ICD-10-CM

## 2014-01-19 LAB — COMPREHENSIVE METABOLIC PANEL
ALK PHOS: 110 U/L (ref 39–117)
ALT: 14 U/L (ref 0–35)
AST: 29 U/L (ref 0–37)
Albumin: 4.7 g/dL (ref 3.5–5.2)
BUN: 27 mg/dL — ABNORMAL HIGH (ref 6–23)
CO2: 31 mEq/L (ref 19–32)
Calcium: 9.8 mg/dL (ref 8.4–10.5)
Chloride: 92 mEq/L — ABNORMAL LOW (ref 96–112)
Creat: 1.63 mg/dL — ABNORMAL HIGH (ref 0.50–1.10)
GLUCOSE: 95 mg/dL (ref 70–99)
Potassium: 4.8 mEq/L (ref 3.5–5.3)
SODIUM: 131 meq/L — AB (ref 135–145)
TOTAL PROTEIN: 7.7 g/dL (ref 6.0–8.3)
Total Bilirubin: 0.5 mg/dL (ref 0.2–1.2)

## 2014-01-19 LAB — TSH: TSH: 19.107 u[IU]/mL — ABNORMAL HIGH (ref 0.350–4.500)

## 2014-01-19 LAB — POCT INR: INR: 2.7

## 2014-01-19 MED ORDER — FUROSEMIDE 80 MG PO TABS: 80.0000 mg | ORAL_TABLET | Freq: Every day | ORAL | Status: DC

## 2014-01-19 MED ORDER — SPIRONOLACTONE 25 MG PO TABS: 12.5000 mg | ORAL_TABLET | Freq: Every day | ORAL | Status: DC

## 2014-01-19 NOTE — Patient Instructions (Signed)
Your physician has requested that you have an echocardiogram. Echocardiography is a painless test that uses sound waves to create images of your heart. It provides your doctor with information about the size and shape of your heart and how well your heart's chambers and valves are working. This procedure takes approximately one hour. There are no restrictions for this procedure.  DECREASE Furosemide to 33m in the mornings.  DECREASE Spironolactone to 1/2 tablet a day.   Your physician recommends that you return for lab work in: [facility] Dr. CSallyanne Kusterrecommends that you schedule a follow-up appointment in: One year.

## 2014-01-20 ENCOUNTER — Encounter: Payer: Self-pay | Admitting: Cardiovascular Disease

## 2014-01-20 NOTE — Progress Notes (Signed)
Patient ID: [name], female   DOB: [birth year], 73 y.o.   MRN: [id number]      Reason for office visit CRT-D follow up, s/p mechanical mitral valve replacement, congestive heart failure  This is a yearly followup for [name]. She has not had any major cardiac problems since her last appointment. She saw Dr. Gwenlyn [name] about 6 months ago. Her only complaint is that of some occasional stinging head her defibrillator site. She has substantial fatty atrophy in that area, but the site otherwise appears healthy without evidence of swelling, redness or compromise of skin integrity. She continues to have a rather unsteady gait and stumbles a lot. She feels tired.  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 Left ventricular ejection fraction which has been as low as 10% improve substantially after implantation of the CRT-D device and now her LVEF is normal. She received her initial CRT-D in 2008 and underwent a generator change out in 2012. She has not received VT therapy. She has 100% biventricular pacing efficiency. She has a Riata 7 [place] ICD lead under advisory, which appeared fluoroscopically normal at the time of for generator change. Did not have any angiographic CAD prior to her surgery.    Allergies  Allergen Reactions  . Ace Inhibitors Cough  . Codeine Nausea And Vomiting  . Lipitor [Atorvastatin Calcium] Other (See Comments)    Knee pain.    Current Outpatient Prescriptions  Medication Sig Dispense Refill  . allopurinol (ZYLOPRIM) 300 MG tablet Take 300 mg by mouth every evening.       [name] ALPRAZolam (XANAX) 0.25 MG tablet Take 0.5 mg by mouth at bedtime.       [name] amiodarone (PACERONE) 200 MG tablet Take 0.5 tablets (100 mg total) by mouth daily.  30 tablet  11  . amoxicillin (AMOXIL) 500 MG capsule TAKE 4 CAPSULES 1 HOUR PRIOR TO DENTAL  PROCEDURES  4 capsule  5  . gabapentin (NEURONTIN) 300 MG capsule Take 300 mg by mouth at bedtime.      . metoCLOPramide (REGLAN) 5 MG tablet Take 5 mg by mouth every morning.       . metoprolol tartrate (LOPRESSOR) 25 MG tablet Take 25 mg by mouth 2 (two) times daily.        . Multiple Vitamins-Minerals (MULTIVITAMINS THER. W/MINERALS) TABS Take 1 tablet by mouth every morning.       [name] omeprazole (PRILOSEC) 40 MG capsule Take 40 mg by mouth daily before breakfast.      . oxyCODONE-acetaminophen (PERCOCET/ROXICET) 5-325 MG per tablet Take 1 tablet by mouth 2 (two) times daily.      . rosuvastatin (CRESTOR) 10 MG tablet Take 10 mg by mouth every evening.      . sertraline (ZOLOFT) 25 MG tablet Take 50 mg by mouth every morning.       . valsartan (DIOVAN) 80 MG tablet Take 1 tablet (80 mg total) by mouth daily.  90 tablet  3  . warfarin (COUMADIN) 2.5 MG tablet Take 2.5 mg by mouth daily at 6 PM.      . furosemide (LASIX) 80 MG tablet Take 1 tablet (80 mg total) by mouth daily.  90 tablet  3  . spironolactone (ALDACTONE) 25 MG tablet Take 0.5 tablets (12.5 mg total) by mouth daily.  90 tablet  3   No current facility-administered medications for this  visit.    Past Medical History  Diagnosis Date  . Colon polyp     adenomatous  . Hemorrhoids   . GERD (gastroesophageal reflux disease)   . Hyperlipidemia   . Bursitis   . Diverticulosis   . Anemia   . MVP (mitral valve prolapse)   . CHF (congestive heart failure)   . Arthritis   . Chronic kidney disease   . Fibromyalgia   . COPD (chronic obstructive pulmonary disease)   . IBS (irritable bowel syndrome)   . Peptic ulcer disease   . H/O mitral valve replacement 2002, 2007  . Biventricular ICD (implantable cardioverter-defibrillator) in place     with CRT  . Dysrhythmia   . Automatic implantable cardioverter-defibrillator in situ     Medtronic Protecta  . Pacemaker   . Gout     Past Surgical History  Procedure Laterality Date    . Abdominal hysterectomy    . Mitral valve replacement  02/26/2006    re-do MVR w/26m St. [name] (Dr. BEllison [name]  . Cardiac defibrillator placement  2007, 11/202012    x2 (pacemaker) (Dr. MJerilynn Mages Rhilee [name])  . Cardiac valve replacement  2002    MV repair - [name] . Cholecystectomy N/A 10/28/2013    Procedure: LAPAROSCOPIC CHOLECYSTECTOMY WITH INTRAOPERATIVE CHOLANGIOGRAM;  Surgeon: BEdward Jolly MD;  [name]  Service: General;  Laterality: N/A;  . Transthoracic echocardiogram  12/2011    EF 50-55%, mild global hypokinesis; LA severely dilated; calcification of anterior/posterior MV leaflets, bi-leaflets St. [name] mechanical MV; mild TR; trace AV regurg/pulm valve regurg  . Nm myocar perf wall motion  2005    persantine myoview - low ris, EF 63%  . Cardiac catheterization  02/25/2006    normal left main, normal LAD, normal L Cfx, normal/dominant RCA (Dr. JAdora Fridge  . Right heart cath  04/03/2006    pulm cap wedge pressure 24/24, PA pressure 43/22 (mean 371mg), CO 4.8, CI 4.1 (Dr. J.Jackie Plum   Family History  Problem Relation Age of Onset  . Breast cancer Sister     multiple  . Uterine cancer Sister     multiple  . Heart disease Mother   . Heart disease Father   . Crohn's disease Sister   . Heart disease Son   . Kidney disease Father     History   Social History  . Marital Status: Married    Spouse Name: N/A    Number of Children: 4  . Years of Education: N/A   Occupational History  . retired    Social History Main Topics  . Smoking status: Former Smoker    Types: Cigarettes    Quit date: 06/02/2001  . Smokeless tobacco: Not on file  . Alcohol Use: Yes     Comment: social-1 every 6 months  . Drug Use: No  . Sexual Activity: Not on file   Other Topics Concern  . Not on file   Social History Narrative  . No narrative on file    Review of systems: The patient specifically denies any chest pain at rest or with exertion, dyspnea at rest or with  exertion, orthopnea, paroxysmal nocturnal dyspnea, syncope, palpitations, focal neurological deficits, intermittent claudication, lower extremity edema, unexplained weight gain, cough, hemoptysis or wheezing.  The patient also denies abdominal pain, nausea, vomiting, dysphagia, diarrhea, constipation, polyuria, polydipsia, dysuria, hematuria, frequency, urgency, abnormal bleeding or bruising, fever, chills, unexpected weight changes, mood swings, change in skin or hair  texture, change in voice quality, auditory or visual problems, allergic reactions or rashes, new musculoskeletal complaints other than usual "aches and pains".   PHYSICAL EXAM BP 102/54  Pulse 73  Resp 16  Ht 5' 2"  (1.575 Kathleen)  Wt 121 lb 14.4 oz (55.293 kg)  BMI 22.29 kg/m2  General: Alert, oriented x3, no distress Head: no evidence of trauma, PERRL, EOMI, no exophtalmos or lid lag, no myxedema, no xanthelasma; normal ears, nose and oropharynx Neck: normal jugular venous pulsations and no hepatojugular reflux; brisk carotid pulses without delay and no carotid bruits Chest: clear to auscultation, no signs of consolidation by percussion or palpation, normal fremitus, symmetrical and full respiratory excursions Cardiovascular: normal position and quality of the apical impulse, regular rhythm, normal first and second heart sounds, no murmurs, rubs or gallops Abdomen: no tenderness or distention, no masses by palpation, no abnormal pulsatility or arterial bruits, normal bowel sounds, no hepatosplenomegaly Extremities: no clubbing, cyanosis or edema; 2+ radial, ulnar and brachial pulses bilaterally; 2+ right femoral, posterior tibial and dorsalis pedis pulses; 2+ left femoral, posterior tibial and dorsalis pedis pulses; no subclavian or femoral bruits Neurological: grossly nonfocal   EKG: Atrial paced, ventricular paced, paced ventricular complex is positive in leads V1 and V2   BMET    Component Value Date/Time   NA 131*  01/19/2014 1105   K 4.8 01/19/2014 1105   CL 92* 01/19/2014 1105   CO2 31 01/19/2014 1105   GLUCOSE 95 01/19/2014 1105   BUN 27* 01/19/2014 1105   CREATININE 1.63* 01/19/2014 1105   CREATININE 1.39* 10/30/2013 0619   CALCIUM 9.8 01/19/2014 1105   GFRNONAA 37* 10/30/2013 0619   GFRAA 43* 10/30/2013 0619     ASSESSMENT AND PLAN S/p CRT-D She has a dual-chamber biventricular pacemaker/ICD initially implanted in 2008 (St. [name] Riata 7 [place] 'ICD lead fluoroscopically normal at time of generator change). Normal device check via [facility] on 01/02/13. 100% biV pacing efficiency, 95% atrial pacing, no atrial or ventricular arrhythmia, all the lead parameters within normal range. Optivol close to individual baseline   Long term (current) use of anticoagulants    History of mitral valve replacement with mechanical valve  Reevaluate mechanical prosthesis with echo  CHF, diastolic chronic Her blood pressure is quite low today. I wonder if this has something to do with her occasional unsteady gait. Will decrease her diuretics and recheck labs. He appears clinically euvolemic and has well compensated heart failure.  (Labs drawn after the office today did show evidence of elevated BUN and creatinine consistent with hypovolemia)   Patient Instructions  Your physician has requested that you have an echocardiogram. Echocardiography is a painless test that uses sound waves to create images of your heart. It provides your doctor with information about the size and shape of your heart and how well your heart's chambers and valves are working. This procedure takes approximately one hour. There are no restrictions for this procedure.  DECREASE Furosemide to 90m in the mornings.  DECREASE Spironolactone to 1/2 tablet a day.   Your physician recommends that you return for lab work in: [facility] Dr. CSallyanne Kusterrecommends that you schedule a follow-up appointment in: One year.        Orders Placed This  Encounter  Procedures  . Comprehensive metabolic panel  . TSH  . EKG 12-Lead  . 2D Echocardiogram without contrast   Meds ordered this encounter  Medications  . furosemide (LASIX) 80 MG tablet    Sig: Take  1 tablet (80 mg total) by mouth daily.    Dispense:  90 tablet    Refill:  3  . spironolactone (ALDACTONE) 25 MG tablet    Sig: Take 0.5 tablets (12.5 mg total) by mouth daily.    Dispense:  90 tablet    Refill:  [place] Compean, MD, [facility] [phone] office [phone] pager

## 2014-01-25 ENCOUNTER — Ambulatory Visit (HOSPITAL_COMMUNITY)
Admission: RE | Admit: 2014-01-25 | Discharge: 2014-01-25 | Disposition: A | Discharge status: Home / Self Care | Payer: Medicare Other | Source: Ambulatory Visit | Attending: Cardiovascular Disease | Admitting: Cardiovascular Disease

## 2014-01-25 DIAGNOSIS — N289 Disorder of kidney and ureter, unspecified: Secondary | ICD-10-CM | POA: Diagnosis not present

## 2014-01-25 DIAGNOSIS — I509 Heart failure, unspecified: Secondary | ICD-10-CM | POA: Insufficient documentation

## 2014-01-25 DIAGNOSIS — Z954 Presence of other heart-valve replacement: Secondary | ICD-10-CM | POA: Diagnosis not present

## 2014-01-25 DIAGNOSIS — E785 Hyperlipidemia, unspecified: Secondary | ICD-10-CM | POA: Diagnosis not present

## 2014-01-25 DIAGNOSIS — I359 Nonrheumatic aortic valve disorder, unspecified: Secondary | ICD-10-CM | POA: Diagnosis present

## 2014-01-25 DIAGNOSIS — J449 Chronic obstructive pulmonary disease, unspecified: Secondary | ICD-10-CM | POA: Diagnosis not present

## 2014-01-25 DIAGNOSIS — I050 Rheumatic mitral stenosis: Secondary | ICD-10-CM

## 2014-01-25 DIAGNOSIS — J4489 Other specified chronic obstructive pulmonary disease: Secondary | ICD-10-CM | POA: Insufficient documentation

## 2014-01-25 NOTE — Progress Notes (Signed)
2D Echo Performed 01/25/2014    Marygrace Drought, RCS

## 2014-02-01 ENCOUNTER — Telehealth: Payer: Self-pay | Admitting: *Deleted

## 2014-02-01 ENCOUNTER — Encounter: Payer: Self-pay | Admitting: Internal Medicine

## 2014-02-01 NOTE — Telephone Encounter (Signed)
LM with Echo results.

## 2014-02-15 ENCOUNTER — Other Ambulatory Visit: Payer: Self-pay | Admitting: Cardiovascular Disease

## 2014-02-17 ENCOUNTER — Ambulatory Visit (INDEPENDENT_AMBULATORY_CARE_PROVIDER_SITE_OTHER): Payer: Medicare Other | Admitting: Pharmacist Clinician (PhC)/ Clinical Pharmacy Specialist

## 2014-02-17 DIAGNOSIS — Z7901 Long term (current) use of anticoagulants: Secondary | ICD-10-CM

## 2014-02-17 DIAGNOSIS — Z954 Presence of other heart-valve replacement: Secondary | ICD-10-CM

## 2014-02-17 DIAGNOSIS — Z952 Presence of prosthetic heart valve: Secondary | ICD-10-CM

## 2014-02-17 LAB — POCT INR: INR: 2.2

## 2014-02-27 ENCOUNTER — Encounter: Payer: Self-pay | Admitting: Cardiovascular Disease

## 2014-03-10 ENCOUNTER — Ambulatory Visit (INDEPENDENT_AMBULATORY_CARE_PROVIDER_SITE_OTHER): Payer: Medicare Other | Admitting: Pharmacist Clinician (PhC)/ Clinical Pharmacy Specialist

## 2014-03-10 DIAGNOSIS — Z954 Presence of other heart-valve replacement: Secondary | ICD-10-CM

## 2014-03-10 DIAGNOSIS — Z952 Presence of prosthetic heart valve: Secondary | ICD-10-CM

## 2014-03-10 DIAGNOSIS — Z7901 Long term (current) use of anticoagulants: Secondary | ICD-10-CM

## 2014-03-10 LAB — POCT INR: INR: 2.5

## 2014-03-21 ENCOUNTER — Other Ambulatory Visit: Payer: Self-pay | Admitting: Cardiovascular Disease

## 2014-03-24 ENCOUNTER — Ambulatory Visit
Admission: RE | Admit: 2014-03-24 | Discharge: 2014-03-24 | Disposition: A | Discharge status: Home / Self Care | Payer: Medicare Other | Source: Ambulatory Visit

## 2014-03-24 DIAGNOSIS — Z1231 Encounter for screening mammogram for malignant neoplasm of breast: Secondary | ICD-10-CM

## 2014-03-29 ENCOUNTER — Ambulatory Visit (INDEPENDENT_AMBULATORY_CARE_PROVIDER_SITE_OTHER): Payer: Medicare Other | Admitting: Internal Medicine

## 2014-03-29 ENCOUNTER — Encounter: Payer: Self-pay | Admitting: Internal Medicine

## 2014-03-29 ENCOUNTER — Telehealth: Payer: Self-pay

## 2014-03-29 VITALS — BP 116/60 | HR 71 | Ht 62.0 in | Wt 124.4 lb

## 2014-03-29 DIAGNOSIS — Z7901 Long term (current) use of anticoagulants: Secondary | ICD-10-CM

## 2014-03-29 DIAGNOSIS — Z86010 Personal history of colonic polyps: Secondary | ICD-10-CM

## 2014-03-29 MED ORDER — MOVIPREP 100 G PO SOLR: 1.0000 | Freq: Once | ORAL | Status: DC

## 2014-03-29 NOTE — Telephone Encounter (Signed)
  03/29/2014   RE: [name] DOB: Nov 30, 1940 MRN: [id number]   Dear Dr. Gwenlyn Found,    We have scheduled the above patient for an endoscopic procedure. Our records show that she is on anticoagulation therapy.   Please advise as to how long the patient may come off her therapy of Coumadin prior to the procedure, which is scheduled for 04/10/2014.  Please fax back/ or route the completed form to [place] at [phone].   Sincerely,    Phillis Haggis

## 2014-03-29 NOTE — Progress Notes (Signed)
HISTORY OF PRESENT ILLNESS:  Kathleen [name] [name] is a 73 y.o. female with MULTIPLE SIGNIFICANT medical problems including asthma, hypertension, hyperlipidemia, mitral valve replacement on chronic systemic anticoagulation in the form of Coumadin, congestive heart failure with implantable defibrillator (ejection fraction this year 60-65%), COPD, pacemaker placement, chronic renal insufficiency, fibromyalgia, hyperlipidemia, and adenomatous colon polyps. She presents today regarding surveillance colonoscopy. She was last seen in this office October 2013 for vague lower abdominal discomfort. Negative workup. Since that time, she is status post cholecystectomy May 2015. Resolution of associated symptoms. Prior colonoscopy in 2002 with adenoma, 2005 no polyps, and March 2010 with small cecal adenoma. Currently, her GI review of systems is negative. She does have chronic constipation for which she takes senna at night with good results. Her chronic medical problems are stable. She is accompanied by her husband.  REVIEW OF SYSTEMS:  All non-GI ROS negative except for anxiety, cough, night sweats  Past Medical History  Diagnosis Date  . Colon polyp     adenomatous  . Hemorrhoids   . GERD (gastroesophageal reflux disease)   . Hyperlipidemia   . Bursitis   . Diverticulosis   . Anemia   . MVP (mitral valve prolapse)   . CHF (congestive heart failure)   . Arthritis   . Chronic kidney disease   . Fibromyalgia   . COPD (chronic obstructive pulmonary disease)   . IBS (irritable bowel syndrome)   . Peptic ulcer disease   . H/O mitral valve replacement 2002, 2007  . Biventricular ICD (implantable cardioverter-defibrillator) in place     with CRT  . Dysrhythmia   . Automatic implantable cardioverter-defibrillator in situ     Medtronic Protecta  . Pacemaker   . Gout     Past Surgical History  Procedure Laterality Date  . Abdominal hysterectomy    . Mitral valve replacement  02/26/2006    re-do MVR  w/22m St. [name] (Dr. BEllison Hughs  . Cardiac defibrillator placement  2007, 11/202012    x2 (pacemaker) (Dr. MJerilynn Mages [place])  . Cardiac valve replacement  2002    MV repair - Dr. RBerle Mull . Cholecystectomy N/A 10/28/2013    Procedure: LAPAROSCOPIC CHOLECYSTECTOMY WITH INTRAOPERATIVE CHOLANGIOGRAM;  Surgeon: BEdward Jolly MD;  [facility]  Service: General;  Laterality: N/A;  . Transthoracic echocardiogram  12/2011    EF 50-55%, mild global hypokinesis; LA severely dilated; calcification of anterior/posterior MV leaflets, bi-leaflets St. [name] mechanical MV; mild TR; trace AV regurg/pulm valve regurg  . Nm myocar perf wall motion  2005    persantine myoview - low ris, EF 63%  . Cardiac catheterization  02/25/2006    normal left main, normal LAD, normal L Cfx, normal/dominant RCA (Dr. JAdora Fridge  . Right heart cath  04/03/2006    pulm cap wedge pressure 24/24, PA pressure 43/22 (mean 39mg), CO 4.8, CI 4.1 (Dr. J.Jackie Plum   Social History Kathleen [name] Fifereports that she quit smoking about 12 years ago. Her smoking use included Cigarettes. She smoked 0.00 packs per day. She does not have any smokeless tobacco history on file. She reports that she drinks alcohol. She reports that she does not use illicit drugs.  family history includes Breast cancer in her sister; Crohn's disease in her sister; Heart disease in her father, mother, and son; Kidney disease in her father; Uterine cancer in her sister.  Allergies  Allergen Reactions  . Ace Inhibitors Cough  . Codeine Nausea And Vomiting  .  Lipitor [Atorvastatin Calcium] Other (See Comments)    Knee pain.       PHYSICAL EXAMINATION: Vital signs: BP 116/60  Pulse 71  Ht 5' 2"  (1.575 m)  Wt 124 lb 6.4 oz (56.427 kg)  BMI 22.75 kg/m2  SpO2 93%  Constitutional: generally well-appearing, no acute distress Psychiatric: alert and oriented x3, cooperative Eyes: extraocular movements intact, anicteric, conjunctiva pink Mouth: oral  pharynx moist, no lesions Neck: supple no lymphadenopathy Cardiovascular: Mechanical heart sounds. heart regular rate and rhythm, no murmur Lungs: clear to auscultation bilaterally Abdomen: soft, nontender, nondistended, no obvious ascites, no peritoneal signs, normal bowel sounds, no organomegaly Rectal: Deferred until colonoscopy Extremities: no lower extremity edema bilaterally Skin: no lesions on visible extremities Neuro: No focal deficits. No asterixis appreciated.    ASSESSMENT:   #1. History of adenomatous colon polyps. Most recent colonoscopy 2010. Due for surveillance #2. Multiple Significant medical problems. #3. Chronic anticoagulation therapy  PLAN:  #1. Colonoscopy. High risk due to comorbidities and the need to manipulate anticoagulation periprocedurally.The nature of the procedure, as well as the risks, benefits, and alternatives were carefully and thoroughly reviewed with the patient. Ample time for discussion and questions allowed. The patient understood, was satisfied, and agreed to proceed.  #2. Consult with the patient's cardiologist regarding management of anticoagulation for the procedure. I suspect that she will need Lovenox bridge. This will be directed by her cardiologist and her cardiologist's Coumadin clinic

## 2014-03-29 NOTE — Patient Instructions (Signed)

## 2014-03-30 NOTE — Telephone Encounter (Signed)
Needs lovenox bridging since she has a Research officer, political party M[employer]  JJB

## 2014-03-30 NOTE — Telephone Encounter (Signed)
Spoke with patient, moved appt for next INR to Monday Nov 2, will set up lovenox bridge at that time.  Pt voiced understanding

## 2014-04-03 ENCOUNTER — Ambulatory Visit (INDEPENDENT_AMBULATORY_CARE_PROVIDER_SITE_OTHER): Payer: Medicare Other | Admitting: Pharmacist Clinician (PhC)/ Clinical Pharmacy Specialist

## 2014-04-03 DIAGNOSIS — Z7901 Long term (current) use of anticoagulants: Secondary | ICD-10-CM

## 2014-04-03 DIAGNOSIS — Z954 Presence of other heart-valve replacement: Secondary | ICD-10-CM

## 2014-04-03 DIAGNOSIS — Z952 Presence of prosthetic heart valve: Secondary | ICD-10-CM

## 2014-04-03 LAB — POCT INR: INR: 2.7

## 2014-04-03 MED ORDER — ENOXAPARIN SODIUM 60 MG/0.6ML ~~LOC~~ SOLN: 60.0000 mg | SUBCUTANEOUS | Status: DC

## 2014-04-03 NOTE — Patient Instructions (Addendum)
Enoxaprin Dosing Schedule  Enoxparin dose: 73m  Date  Warfarin Enoxaprin Dose  11-3 6 2.557m(1 tab)   11-4 5 0   11-5 4 0 8am  11-6 3 0 8am     11-7 2 0 8am     11-8 1 0 8am  11-9 Procedure Procedure   11-10 1 17m103m2 tabs) 8am     11-11 2 17mg28m tabs) 8am     11-12 3 3.717mg48m5 tabs) 8am     11-13 4 3.717mg 27m tabs) 8am    11-14 5 3.717mg (57mtabs) 8am    11-15 6 2.17mg (1 26m) 8am

## 2014-04-07 ENCOUNTER — Encounter: Payer: Self-pay | Admitting: Internal Medicine

## 2014-04-07 ENCOUNTER — Telehealth: Payer: Self-pay

## 2014-04-07 ENCOUNTER — Encounter: Payer: Self-pay | Admitting: Gastroenterology

## 2014-04-07 ENCOUNTER — Telehealth: Payer: Self-pay | Admitting: Internal Medicine

## 2014-04-07 ENCOUNTER — Ambulatory Visit: Payer: Medicare Other | Admitting: Pharmacist Clinician (PhC)/ Clinical Pharmacy Specialist

## 2014-04-07 NOTE — Telephone Encounter (Signed)
Returned patient's call and no answer at either numbers.  Left message on home phone's voicemail for patient to return my call at [phone].  I will attempt to call patient again if she has not called before 4:00 pm.

## 2014-04-07 NOTE — Telephone Encounter (Signed)
Pt has colon scheduled for Monday and wants to know if she can take a stool softener. Advised pt that she could take a stool softener.

## 2014-04-07 NOTE — Telephone Encounter (Signed)
Have [place] take [name] daily. She can take it more than once if needed.

## 2014-04-07 NOTE — Telephone Encounter (Signed)
Dr. Henrene Pastor, do you recommend anything for patient to do regarding her not having a bowel movement in 3 days since stopping her fiber supplement?  She is scheduled for a Colonoscopy Monday at 4:00 pm.  Patient is concerned. Please advise.

## 2014-04-07 NOTE — Telephone Encounter (Signed)
Spoke with pt and she is aware.

## 2014-04-10 ENCOUNTER — Ambulatory Visit (AMBULATORY_SURGERY_CENTER): Payer: Medicare Other | Admitting: Internal Medicine

## 2014-04-10 ENCOUNTER — Encounter: Payer: Self-pay | Admitting: Internal Medicine

## 2014-04-10 VITALS — BP 127/53 | HR 62 | Temp 97.6°F | Resp 20 | Ht 62.0 in | Wt 124.0 lb

## 2014-04-10 DIAGNOSIS — K621 Rectal polyp: Secondary | ICD-10-CM

## 2014-04-10 DIAGNOSIS — D123 Benign neoplasm of transverse colon: Secondary | ICD-10-CM

## 2014-04-10 DIAGNOSIS — Z86010 Personal history of colonic polyps: Secondary | ICD-10-CM

## 2014-04-10 DIAGNOSIS — D128 Benign neoplasm of rectum: Secondary | ICD-10-CM

## 2014-04-10 DIAGNOSIS — D129 Benign neoplasm of anus and anal canal: Secondary | ICD-10-CM

## 2014-04-10 MED ORDER — SODIUM CHLORIDE 0.9 % IV SOLN: 500.0000 mL | INTRAVENOUS | Status: DC

## 2014-04-10 NOTE — Op Note (Signed)
[facility]  [address] [place], [address]   COLONOSCOPY PROCEDURE REPORT  PATIENT: Kathleen, [name]  MR#: [id number] BIRTHDATE: 24-Nov-1940 , 73  yrs. old GENDER: female ENDOSCOPIST: Eustace Quail, MD REFERRED SR:[id number] Program Recall PROCEDURE DATE:  04/10/2014 PROCEDURE:   Colonoscopy with snare polypectomy x 2 First Screening Colonoscopy - Avg.  risk and is 50 yrs.  old or older - No.  Prior Negative Screening - Now for repeat screening. N/A  History of Adenoma - Now for follow-up colonoscopy & has been > or = to 3 yrs.  Yes hx of adenoma.  Has been 3 or more years since last colonoscopy.  Polyps Removed Today? Yes. ASA CLASS:   Class III INDICATIONS:surveillance colonoscopy based on a history of adenomatous colonic polyp(s)Prior exams 2002, 05, 10 (TA). MEDICATIONS: Monitored anesthesia care and Propofol 250 mg IV  DESCRIPTION OF PROCEDURE:   After the risks benefits and alternatives of the procedure were thoroughly explained, informed consent was obtained.  The digital rectal exam revealed no abnormalities of the rectum.   The LB [id number]  endoscope was introduced through the anus and advanced to the cecum, which was identified by both the appendix and ileocecal valve. No adverse events experienced.   The quality of the prep was excellent, using MoviPrep  The instrument was then slowly withdrawn as the colon was fully examined.  COLON FINDINGS: Two polyps, 3-58m in size, were found in the rectum and transverse colon.  A polypectomy was performed with a cold snare.  The resection was complete, the polyp tissue was retrieved and sent to histology.   There was moderate diverticulosis noted in the sigmoid colon.   The examination was otherwise normal. Retroflexed views revealed internal hemorrhoids. The time to cecum=3 minutes 03 seconds.  Withdrawal time=9 minutes 12 seconds. The scope was withdrawn and the procedure  completed.  COMPLICATIONS: There were no immediate complications.  ENDOSCOPIC IMPRESSION: 1.   Two polyps  were found in the rectum and transverse colon; polypectomy was performed with a cold snare 2.   Moderate diverticulosis was noted in the sigmoid colon 3.   The examination was otherwise normal  RECOMMENDATIONS: 1. Repeat colonoscopy in 5 years if polyp adenomatous; otherwise prn 2. Resume anticoagulation without restrictions. Follow-up with your Coumadin clinic as planned  eSigned:  JEustace Quail MD 04/10/2014 4:09 PM   cc: The Patient and DLeanna Battles MD

## 2014-04-10 NOTE — Progress Notes (Signed)
A/ox3, pleased with MAC, report to RN 

## 2014-04-10 NOTE — Patient Instructions (Signed)
Discharge instructions given. Handouts on polyps and diverticulosis. Resume previous medications. YOU HAD AN ENDOSCOPIC PROCEDURE TODAY AT [facility]: Refer to the procedure report that was given to you for any specific questions about what was found during the examination.  If the procedure report does not answer your questions, please call your gastroenterologist to clarify.  If you requested that your care partner not be given the details of your procedure findings, then the procedure report has been included in a sealed envelope for you to review at your convenience later.  YOU SHOULD EXPECT: Some feelings of bloating in the abdomen. Passage of more gas than usual.  Walking can help get rid of the air that was put into your GI tract during the procedure and reduce the bloating. If you had a lower endoscopy (such as a colonoscopy or flexible sigmoidoscopy) you may notice spotting of blood in your stool or on the toilet paper. If you underwent a bowel prep for your procedure, then you may not have a normal bowel movement for a few days.  DIET: Your first meal following the procedure should be a light meal and then it is ok to progress to your normal diet.  A half-sandwich or bowl of soup is an example of a good first meal.  Heavy or fried foods are harder to digest and may make you feel nauseous or bloated.  Likewise meals heavy in dairy and vegetables can cause extra gas to form and this can also increase the bloating.  Drink plenty of fluids but you should avoid alcoholic beverages for 24 hours.  ACTIVITY: Your care partner should take you home directly after the procedure.  You should plan to take it easy, moving slowly for the rest of the day.  You can resume normal activity the day after the procedure however you should NOT DRIVE or use heavy machinery for 24 hours (because of the sedation medicines used during the test).    SYMPTOMS TO REPORT IMMEDIATELY: A gastroenterologist  can be reached at any hour.  During normal business hours, 8:30 AM to 5:00 PM Monday through Friday, call [phone].  After hours and on weekends, please call the GI answering service at [phone] who will take a message and have the physician on call contact you.   Following lower endoscopy (colonoscopy or flexible sigmoidoscopy):  Excessive amounts of blood in the stool  Significant tenderness or worsening of abdominal pains  Swelling of the abdomen that is new, acute  Fever of 100F or higher  FOLLOW UP: If any biopsies were taken you will be contacted by phone or by letter within the next 1-3 weeks.  Call your gastroenterologist if you have not heard about the biopsies in 3 weeks.  Our staff will call the home number listed on your records the next business day following your procedure to check on you and address any questions or concerns that you may have at that time regarding the information given to you following your procedure. This is a courtesy call and so if there is no answer at the home number and we have not heard from you through the emergency physician on call, we will assume that you have returned to your regular daily activities without incident.  SIGNATURES/CONFIDENTIALITY: You and/or your care partner have signed paperwork which will be entered into your electronic medical record.  These signatures attest to the fact that that the information above on your After Visit Summary has been reviewed  and is understood.  Full responsibility of the confidentiality of this discharge information lies with you and/or your care-partner.

## 2014-04-10 NOTE — Progress Notes (Signed)
Called to room to assist during endoscopic procedure.  Patient ID and intended procedure confirmed with present staff. Received instructions for my participation in the procedure from the performing physician.  

## 2014-04-11 ENCOUNTER — Telehealth: Payer: Self-pay | Admitting: *Deleted

## 2014-04-11 NOTE — Telephone Encounter (Signed)
  Follow up Call-  Call back number 04/10/2014  Post procedure Call Back phone  # [phone]  Permission to leave phone message Yes     Patient questions:  Do you have a fever, pain , or abdominal swelling? No. Pain Score  0 *  Have you tolerated food without any problems? Yes.    Have you been able to return to your normal activities? Yes.    Do you have any questions about your discharge instructions: Diet   No. Medications  No. Follow up visit  No.  Do you have questions or concerns about your Care? No.  Actions: * If pain score is 4 or above: No action needed, pain <4.

## 2014-04-17 ENCOUNTER — Ambulatory Visit (INDEPENDENT_AMBULATORY_CARE_PROVIDER_SITE_OTHER): Payer: Medicare Other | Admitting: Pharmacist Clinician (PhC)/ Clinical Pharmacy Specialist

## 2014-04-17 DIAGNOSIS — Z952 Presence of prosthetic heart valve: Secondary | ICD-10-CM

## 2014-04-17 DIAGNOSIS — Z954 Presence of other heart-valve replacement: Secondary | ICD-10-CM

## 2014-04-17 DIAGNOSIS — Z7901 Long term (current) use of anticoagulants: Secondary | ICD-10-CM

## 2014-04-17 LAB — POCT INR: INR: 2.5

## 2014-04-19 ENCOUNTER — Encounter: Payer: Self-pay | Admitting: Internal Medicine

## 2014-05-08 ENCOUNTER — Ambulatory Visit (INDEPENDENT_AMBULATORY_CARE_PROVIDER_SITE_OTHER): Payer: Medicare Other | Admitting: Pharmacist Clinician (PhC)/ Clinical Pharmacy Specialist

## 2014-05-08 DIAGNOSIS — Z954 Presence of other heart-valve replacement: Secondary | ICD-10-CM

## 2014-05-08 DIAGNOSIS — Z952 Presence of prosthetic heart valve: Secondary | ICD-10-CM

## 2014-05-08 DIAGNOSIS — Z7901 Long term (current) use of anticoagulants: Secondary | ICD-10-CM

## 2014-05-08 LAB — POCT INR: INR: 2.8

## 2014-05-10 ENCOUNTER — Encounter (HOSPITAL_COMMUNITY): Payer: Self-pay | Admitting: Cardiovascular Disease

## 2014-05-17 ENCOUNTER — Telehealth: Payer: Self-pay | Admitting: Cardiovascular Disease

## 2014-05-17 NOTE — Telephone Encounter (Signed)
Closed encounter °

## 2014-06-05 ENCOUNTER — Other Ambulatory Visit: Payer: Self-pay | Admitting: Cardiovascular Disease

## 2014-06-05 ENCOUNTER — Ambulatory Visit (INDEPENDENT_AMBULATORY_CARE_PROVIDER_SITE_OTHER): Payer: Medicare Other | Admitting: Pharmacist Clinician (PhC)/ Clinical Pharmacy Specialist

## 2014-06-05 DIAGNOSIS — Z7901 Long term (current) use of anticoagulants: Secondary | ICD-10-CM

## 2014-06-05 DIAGNOSIS — Z954 Presence of other heart-valve replacement: Secondary | ICD-10-CM

## 2014-06-05 DIAGNOSIS — Z952 Presence of prosthetic heart valve: Secondary | ICD-10-CM

## 2014-06-05 LAB — POCT INR: INR: 2.7

## 2014-06-05 NOTE — Telephone Encounter (Signed)
Rx(s) sent to pharmacy electronically.  

## 2014-07-03 ENCOUNTER — Ambulatory Visit (INDEPENDENT_AMBULATORY_CARE_PROVIDER_SITE_OTHER): Payer: Medicare Other | Admitting: Pharmacist Clinician (PhC)/ Clinical Pharmacy Specialist

## 2014-07-03 DIAGNOSIS — Z952 Presence of prosthetic heart valve: Secondary | ICD-10-CM

## 2014-07-03 DIAGNOSIS — Z954 Presence of other heart-valve replacement: Secondary | ICD-10-CM

## 2014-07-03 DIAGNOSIS — Z7901 Long term (current) use of anticoagulants: Secondary | ICD-10-CM

## 2014-07-03 LAB — PROTIME-INR
INR: 5.36 — AB (ref <1.50)
Prothrombin Time: 49 seconds — ABNORMAL HIGH (ref 11.6–15.2)

## 2014-07-03 LAB — POCT INR: INR: 8

## 2014-07-04 ENCOUNTER — Telehealth: Payer: Self-pay | Admitting: Cardiovascular Disease

## 2014-07-04 NOTE — Telephone Encounter (Signed)
INR from lab 5.36.  Pt advised to hold warfarin x 2 more days then resume previous dose.  Appt set for next Wednesday

## 2014-07-04 NOTE — Telephone Encounter (Signed)
[facility] reports an INR of 5.36 from yesterday. Will make [facility] clinic aware

## 2014-07-12 ENCOUNTER — Ambulatory Visit (INDEPENDENT_AMBULATORY_CARE_PROVIDER_SITE_OTHER): Payer: Medicare Other | Admitting: Pharmacist Clinician (PhC)/ Clinical Pharmacy Specialist

## 2014-07-12 DIAGNOSIS — Z952 Presence of prosthetic heart valve: Secondary | ICD-10-CM

## 2014-07-12 DIAGNOSIS — Z954 Presence of other heart-valve replacement: Secondary | ICD-10-CM

## 2014-07-12 DIAGNOSIS — Z7901 Long term (current) use of anticoagulants: Secondary | ICD-10-CM

## 2014-07-12 LAB — POCT INR: INR: 2.3

## 2014-07-15 ENCOUNTER — Other Ambulatory Visit: Payer: Self-pay | Admitting: Cardiovascular Disease

## 2014-07-17 NOTE — Telephone Encounter (Signed)
Rx(s) sent to pharmacy electronically.  

## 2014-07-19 ENCOUNTER — Ambulatory Visit (INDEPENDENT_AMBULATORY_CARE_PROVIDER_SITE_OTHER): Payer: Medicare Other | Admitting: Pharmacist Clinician (PhC)/ Clinical Pharmacy Specialist

## 2014-07-19 DIAGNOSIS — Z952 Presence of prosthetic heart valve: Secondary | ICD-10-CM

## 2014-07-19 DIAGNOSIS — Z7901 Long term (current) use of anticoagulants: Secondary | ICD-10-CM

## 2014-07-19 DIAGNOSIS — Z954 Presence of other heart-valve replacement: Secondary | ICD-10-CM

## 2014-07-19 LAB — POCT INR: INR: 4.4

## 2014-08-04 ENCOUNTER — Encounter: Payer: Self-pay | Admitting: Cardiovascular Disease

## 2014-08-04 ENCOUNTER — Ambulatory Visit (INDEPENDENT_AMBULATORY_CARE_PROVIDER_SITE_OTHER): Payer: Medicare Other | Admitting: Cardiovascular Disease

## 2014-08-04 ENCOUNTER — Ambulatory Visit (INDEPENDENT_AMBULATORY_CARE_PROVIDER_SITE_OTHER): Payer: Medicare Other | Admitting: Pharmacist Clinician (PhC)/ Clinical Pharmacy Specialist

## 2014-08-04 VITALS — BP 110/52 | HR 65 | Ht 62.0 in | Wt 119.4 lb

## 2014-08-04 DIAGNOSIS — Z7901 Long term (current) use of anticoagulants: Secondary | ICD-10-CM

## 2014-08-04 DIAGNOSIS — Z952 Presence of prosthetic heart valve: Secondary | ICD-10-CM

## 2014-08-04 DIAGNOSIS — Z954 Presence of other heart-valve replacement: Secondary | ICD-10-CM

## 2014-08-04 DIAGNOSIS — Z95810 Presence of automatic (implantable) cardiac defibrillator: Secondary | ICD-10-CM

## 2014-08-04 LAB — POCT INR: INR: 3.2

## 2014-08-04 NOTE — Assessment & Plan Note (Signed)
History of St. [name] MVR implanted in 2007. We'll follow this on an annual basis by 2-D echo recently performed in August of last year which showed normal LV systolic function and a well-functioning mitral mechanical prosthesis. She is on Coumadin anticoagulation followed here in our Coumadin clinic.

## 2014-08-04 NOTE — Progress Notes (Signed)
08/04/2014 [name]   Jan 05, 1941  [id number]  Primary Physician [name], MD Primary Cardiologist: Lorretta Harp MD Renae Gloss   HPI:  The patient is a 74 year old, thin appearing, married, Caucasian female, mother of three (one deceased child), grandmother to eight grandchildren, who I last saw 12 months ago. Her husband is also a patient of mine. He had stage IV lung cancer, which responded to chemotherapy. 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 Berle Mull in 2002 with redo with a St. [name] MVR performed by Dr. Gilford Raid in 2007. She had normal coronary arteries. Her EF improved initially from 10% to normal with cardiac resynchronization therapy. Dr. Sallyanne Kuster follows this. She has undergone a generator change by Dr. Sallyanne Kuster on April 22, 2011, and she is followed by [facility]. She is otherwise asymptomatic. Recent 2-D echocardiogram performed 01/25/14 revealed normal ejection fraction with a normal functioning mitral mechanical prosthesis.   Current Outpatient Prescriptions  Medication Sig Dispense Refill  . allopurinol (ZYLOPRIM) 300 MG tablet Take 300 mg by mouth every evening.     [name] amiodarone (PACERONE) 200 MG tablet Take 0.5 tablets (100 mg total) by mouth daily. 30 tablet 11  . amoxicillin (AMOXIL) 500 MG capsule TAKE 4 CAPSULES 1 HOUR PRIOR TO DENTAL PROCEDURES 4 capsule 5  . furosemide (LASIX) 40 MG tablet   11  . gabapentin (NEURONTIN) 300 MG capsule Take 300 mg by mouth at bedtime.    . metoCLOPramide (REGLAN) 5 MG tablet Take 5 mg by mouth every morning.     . metoprolol tartrate (LOPRESSOR) 25 MG tablet Take 25 mg by mouth 2 (two) times daily.      . Multiple Vitamins-Minerals (MULTIVITAMINS THER. W/MINERALS) TABS Take 1 tablet by mouth every morning.     [name] omeprazole (PRILOSEC) 40 MG capsule TAKE ONE CAPSULE BY MOUTH EVERY DAY 30 capsule 7  . pravastatin (PRAVACHOL) 40 MG tablet  Take 40 mg by mouth daily.    . sertraline (ZOLOFT) 25 MG tablet Take 50 mg by mouth every morning.     [name] spironolactone (ALDACTONE) 25 MG tablet Take 1 tablet (25 mg total) by mouth daily. 30 tablet 6  . valsartan (DIOVAN) 80 MG tablet Take 1 tablet (80 mg total) by mouth daily. 90 tablet 3  . warfarin (COUMADIN) 2.5 MG tablet TAKE 1 TABLET BY MOUTH DAILY OR AS DIRECTED 90 tablet 1   No current facility-administered medications for this visit.    Allergies  Allergen Reactions  . Ace Inhibitors Cough  . Codeine Nausea And Vomiting  . Lipitor [Atorvastatin Calcium] Other (See Comments)    Knee pain.    History   Social History  . Marital Status: Married    Spouse Name: N/A  . Number of Children: 4  . Years of Education: N/A   Occupational History  . retired    Social History Main Topics  . Smoking status: Former Smoker    Types: Cigarettes    Quit date: 06/02/2001  . Smokeless tobacco: Never Used  . Alcohol Use: 0.0 oz/week    0 Standard drinks or equivalent per week     Comment: social-1 every 6 months  . Drug Use: No  . Sexual Activity: Not on file   Other Topics Concern  . Not on file   Social History Narrative     Review of Systems: General: negative for chills, fever, night sweats or weight changes.  Cardiovascular: negative for chest pain, dyspnea on exertion, edema, orthopnea, palpitations, paroxysmal nocturnal dyspnea or shortness of breath Dermatological: negative for rash Respiratory: negative for cough or wheezing Urologic: negative for hematuria Abdominal: negative for nausea, vomiting, diarrhea, bright red blood per rectum, melena, or hematemesis Neurologic: negative for visual changes, syncope, or dizziness All other systems reviewed and are otherwise negative except as noted above.    Blood pressure 110/52, pulse 65, height 5' 2"  (1.575 m), weight 119 lb 6.4 oz (54.159 kg).  General appearance: alert and no distress Neck: no adenopathy, no  carotid bruit, no JVD, supple, symmetrical, trachea midline and thyroid not enlarged, symmetric, no tenderness/mass/nodules Lungs: clear to auscultation bilaterally Heart: Irven [place] that it sounds Extremities: extremities normal, atraumatic, no cyanosis or edema  EKG AV paced rhythm at 65 I personally reviewed his EKG  ASSESSMENT AND PLAN:   No problem-specific assessment & plan notes found for this encounter.      Lorretta Harp MD FACP,FACC,FAHA, [facility] 08/04/2014 10:19 AM

## 2014-08-04 NOTE — Assessment & Plan Note (Signed)
History of hyperlipidemia on pravastatin followed by her PCP 

## 2014-08-04 NOTE — Patient Instructions (Signed)
Echocardiogram (In August). Echocardiography is a painless test that uses sound waves to create images of your heart. It provides your doctor with information about the size and shape of your heart and how well your heart's chambers and valves are working. This procedure takes approximately one hour. There are no restrictions for this procedure.  Your physician wants you to follow-up in 1 year with Dr. Gwenlyn Found. You will receive a reminder letter in the mail 2 months in advance. If you do not receive a letter, please call our office to schedule the follow-up appointment.

## 2014-08-04 NOTE — Assessment & Plan Note (Signed)
History of biventricular ICD placed most recently by Dr. Sallyanne Kuster 04/22/11. She was a CRT responder with improvement in ejection fraction from 10% to 60%. This is followed by Dr. Sallyanne Kuster biannual basis. She is asymptomatic

## 2014-08-20 ENCOUNTER — Other Ambulatory Visit: Payer: Self-pay | Admitting: Cardiovascular Disease

## 2014-08-21 NOTE — Telephone Encounter (Signed)
Rx(s) sent to pharmacy electronically.  

## 2014-08-25 ENCOUNTER — Ambulatory Visit (HOSPITAL_COMMUNITY)
Admission: RE | Admit: 2014-08-25 | Discharge: 2014-08-25 | Disposition: A | Discharge status: Home / Self Care | Payer: Medicare Other | Source: Ambulatory Visit | Attending: Cardiology | Admitting: Cardiology

## 2014-08-25 ENCOUNTER — Ambulatory Visit (INDEPENDENT_AMBULATORY_CARE_PROVIDER_SITE_OTHER): Payer: Medicare Other | Admitting: Pharmacist Clinician (PhC)/ Clinical Pharmacy Specialist

## 2014-08-25 DIAGNOSIS — Z7901 Long term (current) use of anticoagulants: Secondary | ICD-10-CM | POA: Diagnosis not present

## 2014-08-25 DIAGNOSIS — Z952 Presence of prosthetic heart valve: Secondary | ICD-10-CM

## 2014-08-25 DIAGNOSIS — Z954 Presence of other heart-valve replacement: Secondary | ICD-10-CM | POA: Diagnosis not present

## 2014-08-25 LAB — POCT INR: INR: 1.8

## 2014-08-25 NOTE — Progress Notes (Signed)
2D Echocardiogram Complete.  08/25/2014   Clary Meeker [place], RDCS

## 2014-08-28 ENCOUNTER — Encounter: Payer: Self-pay | Admitting: *Deleted

## 2014-09-11 ENCOUNTER — Ambulatory Visit (INDEPENDENT_AMBULATORY_CARE_PROVIDER_SITE_OTHER): Payer: Medicare Other | Admitting: Pharmacist Clinician (PhC)/ Clinical Pharmacy Specialist

## 2014-09-11 DIAGNOSIS — Z7901 Long term (current) use of anticoagulants: Secondary | ICD-10-CM

## 2014-09-11 DIAGNOSIS — Z954 Presence of other heart-valve replacement: Secondary | ICD-10-CM | POA: Diagnosis not present

## 2014-09-11 DIAGNOSIS — Z952 Presence of prosthetic heart valve: Secondary | ICD-10-CM

## 2014-09-11 LAB — POCT INR: INR: 1.8

## 2014-09-25 ENCOUNTER — Ambulatory Visit (INDEPENDENT_AMBULATORY_CARE_PROVIDER_SITE_OTHER): Payer: Medicare Other | Admitting: Pharmacist Clinician (PhC)/ Clinical Pharmacy Specialist

## 2014-09-25 DIAGNOSIS — Z7901 Long term (current) use of anticoagulants: Secondary | ICD-10-CM | POA: Diagnosis not present

## 2014-09-25 DIAGNOSIS — Z952 Presence of prosthetic heart valve: Secondary | ICD-10-CM

## 2014-09-25 DIAGNOSIS — Z954 Presence of other heart-valve replacement: Secondary | ICD-10-CM

## 2014-09-25 LAB — POCT INR: INR: 1.9

## 2014-09-28 ENCOUNTER — Other Ambulatory Visit: Payer: Self-pay | Admitting: Cardiovascular Disease

## 2014-10-09 ENCOUNTER — Ambulatory Visit (INDEPENDENT_AMBULATORY_CARE_PROVIDER_SITE_OTHER): Payer: Medicare Other | Admitting: Pharmacist Clinician (PhC)/ Clinical Pharmacy Specialist

## 2014-10-09 ENCOUNTER — Other Ambulatory Visit: Payer: Self-pay | Admitting: Cardiovascular Disease

## 2014-10-09 DIAGNOSIS — Z7901 Long term (current) use of anticoagulants: Secondary | ICD-10-CM

## 2014-10-09 DIAGNOSIS — Z954 Presence of other heart-valve replacement: Secondary | ICD-10-CM | POA: Diagnosis not present

## 2014-10-09 DIAGNOSIS — Z952 Presence of prosthetic heart valve: Secondary | ICD-10-CM

## 2014-10-09 LAB — POCT INR: INR: 2.5

## 2014-11-01 ENCOUNTER — Ambulatory Visit (INDEPENDENT_AMBULATORY_CARE_PROVIDER_SITE_OTHER): Payer: Medicare Other | Admitting: Pharmacist Clinician (PhC)/ Clinical Pharmacy Specialist

## 2014-11-01 DIAGNOSIS — Z954 Presence of other heart-valve replacement: Secondary | ICD-10-CM | POA: Diagnosis not present

## 2014-11-01 DIAGNOSIS — Z952 Presence of prosthetic heart valve: Secondary | ICD-10-CM

## 2014-11-01 DIAGNOSIS — Z7901 Long term (current) use of anticoagulants: Secondary | ICD-10-CM

## 2014-11-01 LAB — POCT INR: INR: 2

## 2014-11-14 ENCOUNTER — Ambulatory Visit (INDEPENDENT_AMBULATORY_CARE_PROVIDER_SITE_OTHER): Payer: Medicare Other | Admitting: Pharmacist Clinician (PhC)/ Clinical Pharmacy Specialist

## 2014-11-14 DIAGNOSIS — Z7901 Long term (current) use of anticoagulants: Secondary | ICD-10-CM | POA: Diagnosis not present

## 2014-11-14 DIAGNOSIS — Z954 Presence of other heart-valve replacement: Secondary | ICD-10-CM

## 2014-11-14 DIAGNOSIS — Z952 Presence of prosthetic heart valve: Secondary | ICD-10-CM

## 2014-11-14 LAB — POCT INR: INR: 1.8

## 2014-11-16 ENCOUNTER — Other Ambulatory Visit: Payer: Self-pay | Admitting: Internal Medicine

## 2014-11-16 DIAGNOSIS — M542 Cervicalgia: Secondary | ICD-10-CM

## 2014-11-22 ENCOUNTER — Ambulatory Visit
Admission: RE | Admit: 2014-11-22 | Discharge: 2014-11-22 | Disposition: A | Discharge status: Home / Self Care | Payer: Medicare Other | Source: Ambulatory Visit | Attending: Internal Medicine | Admitting: Internal Medicine

## 2014-11-22 DIAGNOSIS — M542 Cervicalgia: Secondary | ICD-10-CM

## 2014-11-29 ENCOUNTER — Ambulatory Visit (INDEPENDENT_AMBULATORY_CARE_PROVIDER_SITE_OTHER): Payer: Medicare Other | Admitting: Pharmacist Clinician (PhC)/ Clinical Pharmacy Specialist

## 2014-11-29 DIAGNOSIS — Z7901 Long term (current) use of anticoagulants: Secondary | ICD-10-CM | POA: Diagnosis not present

## 2014-11-29 DIAGNOSIS — Z954 Presence of other heart-valve replacement: Secondary | ICD-10-CM | POA: Diagnosis not present

## 2014-11-29 DIAGNOSIS — Z952 Presence of prosthetic heart valve: Secondary | ICD-10-CM

## 2014-11-29 LAB — POCT INR: INR: 2.5

## 2014-12-20 ENCOUNTER — Ambulatory Visit (INDEPENDENT_AMBULATORY_CARE_PROVIDER_SITE_OTHER): Payer: Medicare Other | Admitting: Pharmacist Clinician (PhC)/ Clinical Pharmacy Specialist

## 2014-12-20 DIAGNOSIS — Z954 Presence of other heart-valve replacement: Secondary | ICD-10-CM | POA: Diagnosis not present

## 2014-12-20 DIAGNOSIS — Z952 Presence of prosthetic heart valve: Secondary | ICD-10-CM

## 2014-12-20 DIAGNOSIS — Z7901 Long term (current) use of anticoagulants: Secondary | ICD-10-CM

## 2014-12-20 LAB — POCT INR: INR: 2.2

## 2014-12-25 ENCOUNTER — Ambulatory Visit (INDEPENDENT_AMBULATORY_CARE_PROVIDER_SITE_OTHER): Payer: Medicare Other | Admitting: Pharmacist Clinician (PhC)/ Clinical Pharmacy Specialist

## 2014-12-25 DIAGNOSIS — Z7901 Long term (current) use of anticoagulants: Secondary | ICD-10-CM | POA: Diagnosis not present

## 2014-12-25 DIAGNOSIS — Z952 Presence of prosthetic heart valve: Secondary | ICD-10-CM

## 2014-12-25 DIAGNOSIS — Z954 Presence of other heart-valve replacement: Secondary | ICD-10-CM

## 2014-12-25 LAB — POCT INR: INR: 3

## 2015-01-03 ENCOUNTER — Ambulatory Visit: Payer: Medicare Other | Admitting: Pharmacist Clinician (PhC)/ Clinical Pharmacy Specialist

## 2015-01-08 ENCOUNTER — Ambulatory Visit (INDEPENDENT_AMBULATORY_CARE_PROVIDER_SITE_OTHER): Payer: Medicare Other | Admitting: Pharmacist Clinician (PhC)/ Clinical Pharmacy Specialist

## 2015-01-08 DIAGNOSIS — Z7901 Long term (current) use of anticoagulants: Secondary | ICD-10-CM | POA: Diagnosis not present

## 2015-01-08 DIAGNOSIS — Z954 Presence of other heart-valve replacement: Secondary | ICD-10-CM | POA: Diagnosis not present

## 2015-01-08 DIAGNOSIS — Z952 Presence of prosthetic heart valve: Secondary | ICD-10-CM

## 2015-01-08 LAB — POCT INR: INR: 3.8

## 2015-01-26 ENCOUNTER — Ambulatory Visit (INDEPENDENT_AMBULATORY_CARE_PROVIDER_SITE_OTHER): Payer: Medicare Other | Admitting: Cardiovascular Disease

## 2015-01-26 ENCOUNTER — Ambulatory Visit (INDEPENDENT_AMBULATORY_CARE_PROVIDER_SITE_OTHER): Payer: Medicare Other | Admitting: Pharmacist Clinician (PhC)/ Clinical Pharmacy Specialist

## 2015-01-26 ENCOUNTER — Telehealth: Payer: Self-pay | Admitting: *Deleted

## 2015-01-26 ENCOUNTER — Encounter: Payer: Self-pay | Admitting: Cardiovascular Disease

## 2015-01-26 VITALS — BP 118/60 | HR 66 | Ht 62.0 in | Wt 122.0 lb

## 2015-01-26 DIAGNOSIS — Z7901 Long term (current) use of anticoagulants: Secondary | ICD-10-CM | POA: Diagnosis not present

## 2015-01-26 DIAGNOSIS — Z79899 Other long term (current) drug therapy: Secondary | ICD-10-CM | POA: Diagnosis not present

## 2015-01-26 DIAGNOSIS — R079 Chest pain, unspecified: Secondary | ICD-10-CM | POA: Diagnosis not present

## 2015-01-26 DIAGNOSIS — Z952 Presence of prosthetic heart valve: Secondary | ICD-10-CM

## 2015-01-26 DIAGNOSIS — Z954 Presence of other heart-valve replacement: Secondary | ICD-10-CM

## 2015-01-26 DIAGNOSIS — Z95810 Presence of automatic (implantable) cardiac defibrillator: Secondary | ICD-10-CM | POA: Diagnosis not present

## 2015-01-26 LAB — POCT INR: INR: 2.9

## 2015-01-26 NOTE — Patient Instructions (Signed)
Your physician recommends that you return for lab work in: If [facility] did not run a Liver Function Test and Thyroid blood test please have this blood work done at [facility].  Remote monitoring is used to monitor your Pacemaker or ICD from home. This monitoring reduces the number of office visits required to check your device to one time per year. It allows [place] to monitor the functioning of your device to ensure it is working properly. You are scheduled for a device check from home on April 29, 2015. You may send your transmission at any time that day. If you have a wireless device, the transmission will be sent automatically. After your physician reviews your transmission, you will receive a postcard with your next transmission date.  Dr. Sallyanne Kuster recommends that you schedule a follow-up appointment in: One year.

## 2015-01-26 NOTE — Progress Notes (Signed)
Patient ID: [name], female   DOB: 22-Jul-1940, 74 y.o.   MRN: [id number]      Cardiology Office Note   Date:  01/26/2015   ID:  [name], DOB [birth year], MRN [id number]  PCP:  [name], MD  Cardiologist:   Quay Burow, M.D.; Sanda Klein, MD   Chief Complaint  Patient presents with  . Annual Exam    Patient has felt light headed, dizzy, SOB, chest pain/pressure, and has had swelling in her feet.      History of Present Illness: [name] is a 74 y.o. female who presents for  Follow-up on her CRT-D device, implanted for nonischemic cardiomyopathy.  Her last appointment with Dr. Gwenlyn Found was in March.  She is overdue a check on her device.  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 Left ventricular ejection fraction which has been as low as 10% improve substantially after implantation of the CRT-D device and now her LVEF is normal. She received her initial CRT-D in 2008 and underwent a generator change out in 2012. She has not received VT therapy. She has 100% biventricular pacing efficiency. She has a Riata 7 [place] ICD lead under advisory, which appeared fluoroscopically normal at the time of for generator change. Did not have any angiographic CAD prior to her surgery.  Past Medical History  Diagnosis Date  . Colon polyp     adenomatous  . Hemorrhoids   . GERD (gastroesophageal reflux disease)   . Hyperlipidemia   . Bursitis   . Diverticulosis   . Anemia   . MVP (mitral valve prolapse)   . CHF (congestive heart failure)   . Chronic kidney disease   . Fibromyalgia   . COPD (chronic obstructive pulmonary disease)   . IBS (irritable bowel syndrome)   . Peptic ulcer disease   . H/O mitral valve replacement 2002, 2007  . Biventricular ICD (implantable cardioverter-defibrillator) in place     with CRT  .  Dysrhythmia   . Automatic implantable cardioverter-defibrillator in situ     Medtronic Protecta  . Pacemaker   . Gout   . Anxiety   . Arthritis   . Asthma   . Blood transfusion without reported diagnosis   . Cataract     RIGHT EYE  . Heart murmur     Past Surgical History  Procedure Laterality Date  . Abdominal hysterectomy    . Mitral valve replacement  02/26/2006    re-do MVR w/35m St. [name] (Dr. BEllison Hughs  . Cardiac defibrillator placement  2007, 11/202012    x2 (pacemaker) (Dr. MJerilynn Mages Mariachristina Holle)  . Cardiac valve replacement  2002    MV repair - Dr. RBerle Mull . Cholecystectomy N/A 10/28/2013    Procedure: LAPAROSCOPIC CHOLECYSTECTOMY WITH INTRAOPERATIVE CHOLANGIOGRAM;  Surgeon: BEdward Jolly MD;  [facility]  Service: General;  Laterality: N/A;  . Transthoracic echocardiogram  12/2011    EF 50-55%, mild global hypokinesis; LA severely dilated; calcification of anterior/posterior MV leaflets, bi-leaflets St. [name] mechanical MV; mild TR; trace AV regurg/pulm valve regurg  . Nm myocar perf wall motion  2005    persantine myoview - low ris, EF 63%  . Cardiac catheterization  02/25/2006    normal left main, normal LAD, normal L Cfx, normal/dominant RCA (Dr. JAdora Fridge  . Right heart cath  04/03/2006    pulm cap  wedge pressure 24/24, PA pressure 43/22 (mean 58mHg), CO 4.8, CI 4.1 (Dr. JJackie Plum  . Colonoscopy    . Implantable cardioverter defibrillator (icd) generator change N/A 04/10/2011    Procedure: ICD GENERATOR CHANGE;  Surgeon: MSanda Klein MD;  [facility];  Service: Cardiovascular;  Laterality: N/A;     Current Outpatient Prescriptions  Medication Sig Dispense Refill  . allopurinol (ZYLOPRIM) 300 MG tablet Take 300 mg by mouth every evening.     .Marland Kitchenamiodarone (PACERONE) 200 MG tablet Take 0.5 tablets (100 mg total) by mouth daily. 30 tablet 5  . amoxicillin (AMOXIL) 500 MG capsule TAKE 4 CAPSULES 1 HOUR PRIOR TO DENTAL PROCEDURES 4 capsule 5  .  furosemide (LASIX) 40 MG tablet Take 40 mg by mouth daily.   11  . gabapentin (NEURONTIN) 300 MG capsule Take 300 mg by mouth 2 (two) times daily.     . metoprolol tartrate (LOPRESSOR) 25 MG tablet Take 25 mg by mouth daily.     . Multiple Vitamins-Minerals (MULTIVITAMINS THER. W/MINERALS) TABS Take 1 tablet by mouth every morning.     .Marland Kitchenomeprazole (PRILOSEC) 40 MG capsule TAKE ONE CAPSULE BY MOUTH EVERY DAY 30 capsule 7  . pravastatin (PRAVACHOL) 40 MG tablet Take 40 mg by mouth daily.    .Marland KitchenPROAIR HFA 108 (90 BASE) MCG/ACT inhaler Inhale 1-2 puffs into the lungs as needed.    . Probiotic Product (ALIGN PO) Take by mouth daily.    . sertraline (ZOLOFT) 25 MG tablet Take 50 mg by mouth every morning.     .Marland Kitchenspironolactone (ALDACTONE) 25 MG tablet Take 1 tablet (25 mg total) by mouth daily. 30 tablet 6  . warfarin (COUMADIN) 2.5 MG tablet TAKE 1 TABLET BY MOUTH DAILY OR AS DIRECTED 90 tablet 1   No current facility-administered medications for this visit.    Allergies:   Ace inhibitors; Codeine; and Lipitor    Social History:  The patient  reports that she quit smoking about 13 years ago. Her smoking use included Cigarettes. She has never used smokeless tobacco. She reports that she drinks alcohol. She reports that she does not use illicit drugs.   Family History:  The patient's family history includes Breast cancer in her sister; Crohn's disease in her sister; Heart disease in her father, mother, and son; Kidney disease in her father; Uterine cancer in her sister.    ROS:  Please see the history of present illness.    Otherwise, review of systems positive for none.   All other systems are reviewed and negative.    PHYSICAL EXAM: VS:  BP 118/60 mmHg  Pulse 66  Ht 5' 2"  (1.575 m)  Wt 122 lb (55.339 kg)  BMI 22.31 kg/m2 , BMI Body mass index is 22.31 kg/(m^2).  General: Alert, oriented x3, no distress Head: no evidence of trauma, PERRL, EOMI, no exophtalmos or lid lag, no myxedema, no  xanthelasma; normal ears, nose and oropharynx Neck: normal jugular venous pulsations and no hepatojugular reflux; brisk carotid pulses without delay and no carotid bruits Chest: clear to auscultation, no signs of consolidation by percussion or palpation, normal fremitus, symmetrical and full respiratory excursions Cardiovascular: normal position and quality of the apical impulse, regular rhythm, normal first and second heart sounds, no murmurs, rubs or gallops Abdomen: no tenderness or distention, no masses by palpation, no abnormal pulsatility or arterial bruits, normal bowel sounds, no hepatosplenomegaly Extremities: no clubbing, cyanosis or edema; 2+ radial, ulnar and brachial pulses bilaterally;  2+ right femoral, posterior tibial and dorsalis pedis pulses; 2+ left femoral, posterior tibial and dorsalis pedis pulses; no subclavian or femoral bruits Neurological: grossly nonfocal   EKG: Atrial paced, ventricular paced, paced ventricular complex is positive in leads V1 and V2   Recent Labs: No results found for requested labs within last 365 days.    Lipid Panel    Component Value Date/Time   CHOL * 12/09/2006 0410    318        ATP III CLASSIFICATION:  <200     mg/dL   Desirable  200-239  mg/dL   Borderline High  >=240    mg/dL   High   TRIG 225* 12/09/2006 0410   HDL 39* 12/09/2006 0410   CHOLHDL 8.2 12/09/2006 0410   VLDL 45* 12/09/2006 0410   LDLCALC * 12/09/2006 0410    234        Total Cholesterol/HDL:CHD Risk Coronary Heart Disease Risk Table                     Men   Women  1/2 Average Risk   3.4   3.3      Wt Readings from Last 3 Encounters:  01/26/15 122 lb (55.339 kg)  08/04/14 119 lb 6.4 oz (54.159 kg)  04/10/14 124 lb (56.246 kg)     ASSESSMENT AND PLAN:  Will need to troubleshoot why her downloads have not been periodically coming. She seems to have normal device function.  We'll schedule a download ASAP. Need to make sure we are receiving her downloads  every 3 months.   while on amiodarone therapy, she needs to have liver function tests and thyroid function tests at least twice yearly.  Labs performed in the [place] [facility] office on July 26 did not include liver function tests or TSH. She thinks that her kidney function was "18%" but in fact her creatinine is 1.8 and her GFR is approximately 30.  Gave her a lab slip for liver function tests and TSH    Current medicines are reviewed at length with the patient today.  The patient does not have concerns regarding medicines.  The following changes have been made:  no change  Labs/ tests ordered today include:  Orders Placed This Encounter  Procedures  . Hepatic function panel  . TSH  . EKG 12-Lead     Patient Instructions  Your physician recommends that you return for lab work in: If [facility] did not run a Liver Function Test and Thyroid blood test please have this blood work done at [facility].  Remote monitoring is used to monitor your Pacemaker or ICD from home. This monitoring reduces the number of office visits required to check your device to one time per year. It allows [place] to monitor the functioning of your device to ensure it is working properly. You are scheduled for a device check from home on April 29, 2015. You may send your transmission at any time that day. If you have a wireless device, the transmission will be sent automatically. After your physician reviews your transmission, you will receive a postcard with your next transmission date.  Dr. Sallyanne Kuster recommends that you schedule a follow-up appointment in: One year.         Mikael Spray, MD  01/26/2015 6:14 PM    Sanda Klein, MD, [facility] [phone] office [phone] pager

## 2015-01-26 NOTE — Telephone Encounter (Signed)
LMTCB/SSS 

## 2015-01-29 NOTE — Telephone Encounter (Signed)
Pt returned call

## 2015-01-30 ENCOUNTER — Telehealth: Payer: Self-pay | Admitting: *Deleted

## 2015-01-30 DIAGNOSIS — Z79899 Other long term (current) drug therapy: Secondary | ICD-10-CM

## 2015-01-30 LAB — TSH: TSH: 16.767 u[IU]/mL — AB (ref 0.350–4.500)

## 2015-01-30 MED ORDER — LEVOTHYROXINE SODIUM 50 MCG PO TABS
50.0000 ug | ORAL_TABLET | Freq: Every day | ORAL | Status: DC
Start: 2015-01-30 — End: 2015-09-01

## 2015-01-30 NOTE — Progress Notes (Signed)
LMTC

## 2015-01-30 NOTE — Telephone Encounter (Signed)
Lab results called to patient.  Rx sent to pharmacy for levothyroxine 74mg and lab  Order for recheck in 4 months mailed to patient.  Patient voiced understanding.

## 2015-01-30 NOTE — Telephone Encounter (Signed)
-----   Message from Sanda Klein, MD sent at 01/30/2015  9:38 AM EDT ----- Still hypothyroid. Needs to start levothyroxine 50 mcg and recheck TSH in 16 weeks please. Copy to Dr. Philip Aspen please

## 2015-01-30 NOTE — Telephone Encounter (Signed)
LMTCB//sss 

## 2015-01-30 NOTE — Progress Notes (Signed)
[place] both numbers

## 2015-01-30 NOTE — Telephone Encounter (Signed)
Follow up     Pt returning your call Pt says machine is working now

## 2015-01-31 ENCOUNTER — Encounter: Payer: Self-pay | Admitting: Cardiovascular Disease

## 2015-02-02 NOTE — Telephone Encounter (Signed)
Informed patient that remote was received. Patient voiced understanding.

## 2015-02-14 ENCOUNTER — Ambulatory Visit (INDEPENDENT_AMBULATORY_CARE_PROVIDER_SITE_OTHER): Payer: Medicare Other | Admitting: Pharmacist Clinician (PhC)/ Clinical Pharmacy Specialist

## 2015-02-14 DIAGNOSIS — Z7901 Long term (current) use of anticoagulants: Secondary | ICD-10-CM

## 2015-02-14 DIAGNOSIS — Z954 Presence of other heart-valve replacement: Secondary | ICD-10-CM

## 2015-02-14 DIAGNOSIS — Z952 Presence of prosthetic heart valve: Secondary | ICD-10-CM

## 2015-02-14 LAB — POCT INR: INR: 2.3

## 2015-02-21 ENCOUNTER — Other Ambulatory Visit: Payer: Self-pay

## 2015-02-21 ENCOUNTER — Other Ambulatory Visit: Payer: Self-pay | Admitting: Cardiovascular Disease

## 2015-02-21 DIAGNOSIS — Z1231 Encounter for screening mammogram for malignant neoplasm of breast: Secondary | ICD-10-CM

## 2015-02-22 ENCOUNTER — Telehealth: Payer: Self-pay | Admitting: *Deleted

## 2015-02-22 MED ORDER — WARFARIN SODIUM 2.5 MG PO TABS: 2.5000 mg | ORAL_TABLET | Freq: Once | ORAL | Status: DC

## 2015-02-23 ENCOUNTER — Other Ambulatory Visit: Payer: Self-pay | Admitting: Cardiovascular Disease

## 2015-03-07 ENCOUNTER — Ambulatory Visit (INDEPENDENT_AMBULATORY_CARE_PROVIDER_SITE_OTHER): Payer: Medicare Other | Admitting: Pharmacist Clinician (PhC)/ Clinical Pharmacy Specialist

## 2015-03-07 DIAGNOSIS — Z7901 Long term (current) use of anticoagulants: Secondary | ICD-10-CM

## 2015-03-07 DIAGNOSIS — Z954 Presence of other heart-valve replacement: Secondary | ICD-10-CM | POA: Diagnosis not present

## 2015-03-07 DIAGNOSIS — Z952 Presence of prosthetic heart valve: Secondary | ICD-10-CM

## 2015-03-07 LAB — POCT INR: INR: 2.7

## 2015-03-28 ENCOUNTER — Ambulatory Visit
Admission: RE | Admit: 2015-03-28 | Discharge: 2015-03-28 | Disposition: A | Discharge status: Home / Self Care | Payer: Medicare Other | Source: Ambulatory Visit

## 2015-03-28 DIAGNOSIS — Z1231 Encounter for screening mammogram for malignant neoplasm of breast: Secondary | ICD-10-CM

## 2015-04-04 ENCOUNTER — Ambulatory Visit (INDEPENDENT_AMBULATORY_CARE_PROVIDER_SITE_OTHER): Payer: Medicare Other | Admitting: Pharmacist Clinician (PhC)/ Clinical Pharmacy Specialist

## 2015-04-04 DIAGNOSIS — Z7901 Long term (current) use of anticoagulants: Secondary | ICD-10-CM | POA: Diagnosis not present

## 2015-04-04 DIAGNOSIS — Z954 Presence of other heart-valve replacement: Secondary | ICD-10-CM

## 2015-04-04 DIAGNOSIS — Z952 Presence of prosthetic heart valve: Secondary | ICD-10-CM

## 2015-04-04 LAB — POCT INR: INR: 2.3

## 2015-04-24 ENCOUNTER — Ambulatory Visit (INDEPENDENT_AMBULATORY_CARE_PROVIDER_SITE_OTHER): Payer: Medicare Other | Admitting: Pharmacist Clinician (PhC)/ Clinical Pharmacy Specialist

## 2015-04-24 DIAGNOSIS — Z952 Presence of prosthetic heart valve: Secondary | ICD-10-CM

## 2015-04-24 DIAGNOSIS — Z7901 Long term (current) use of anticoagulants: Secondary | ICD-10-CM

## 2015-04-24 DIAGNOSIS — Z954 Presence of other heart-valve replacement: Secondary | ICD-10-CM

## 2015-04-24 LAB — POCT INR: INR: 2.7

## 2015-05-02 ENCOUNTER — Other Ambulatory Visit: Payer: Self-pay | Admitting: Cardiovascular Disease

## 2015-05-03 NOTE — Telephone Encounter (Signed)
REFILL 

## 2015-05-21 ENCOUNTER — Ambulatory Visit (INDEPENDENT_AMBULATORY_CARE_PROVIDER_SITE_OTHER): Payer: Medicare Other | Admitting: Pharmacist Clinician (PhC)/ Clinical Pharmacy Specialist

## 2015-05-21 DIAGNOSIS — Z7901 Long term (current) use of anticoagulants: Secondary | ICD-10-CM | POA: Diagnosis not present

## 2015-05-21 DIAGNOSIS — Z952 Presence of prosthetic heart valve: Secondary | ICD-10-CM

## 2015-05-21 DIAGNOSIS — Z954 Presence of other heart-valve replacement: Secondary | ICD-10-CM

## 2015-05-21 LAB — POCT INR: INR: 2.7

## 2015-05-21 MED ORDER — WARFARIN SODIUM 2.5 MG PO TABS: ORAL_TABLET | ORAL | Status: DC

## 2015-06-09 LAB — TSH: TSH: 4.198 u[IU]/mL (ref 0.350–4.500)

## 2015-06-12 NOTE — Telephone Encounter (Signed)
Hmm, at this point I would not change meds, but we should check again in 1 month or so and also check free T4

## 2015-06-18 ENCOUNTER — Ambulatory Visit (INDEPENDENT_AMBULATORY_CARE_PROVIDER_SITE_OTHER): Payer: Medicare Other | Admitting: Pharmacist Clinician (PhC)/ Clinical Pharmacy Specialist

## 2015-06-18 DIAGNOSIS — Z7901 Long term (current) use of anticoagulants: Secondary | ICD-10-CM

## 2015-06-18 DIAGNOSIS — Z954 Presence of other heart-valve replacement: Secondary | ICD-10-CM

## 2015-06-18 DIAGNOSIS — Z952 Presence of prosthetic heart valve: Secondary | ICD-10-CM

## 2015-06-18 LAB — POCT INR: INR: 2.6

## 2015-07-16 ENCOUNTER — Ambulatory Visit (INDEPENDENT_AMBULATORY_CARE_PROVIDER_SITE_OTHER): Payer: Medicare Other | Admitting: Pharmacist Clinician (PhC)/ Clinical Pharmacy Specialist

## 2015-07-16 DIAGNOSIS — Z7901 Long term (current) use of anticoagulants: Secondary | ICD-10-CM

## 2015-07-16 DIAGNOSIS — Z954 Presence of other heart-valve replacement: Secondary | ICD-10-CM | POA: Diagnosis not present

## 2015-07-16 DIAGNOSIS — Z952 Presence of prosthetic heart valve: Secondary | ICD-10-CM

## 2015-07-16 LAB — POCT INR: INR: 2.1

## 2015-07-30 ENCOUNTER — Other Ambulatory Visit: Payer: Self-pay | Admitting: Cardiovascular Disease

## 2015-07-30 ENCOUNTER — Ambulatory Visit (INDEPENDENT_AMBULATORY_CARE_PROVIDER_SITE_OTHER): Payer: Medicare Other | Admitting: Pharmacist Clinician (PhC)/ Clinical Pharmacy Specialist

## 2015-07-30 DIAGNOSIS — Z7901 Long term (current) use of anticoagulants: Secondary | ICD-10-CM | POA: Diagnosis not present

## 2015-07-30 DIAGNOSIS — Z954 Presence of other heart-valve replacement: Secondary | ICD-10-CM

## 2015-07-30 DIAGNOSIS — Z952 Presence of prosthetic heart valve: Secondary | ICD-10-CM

## 2015-07-30 LAB — POCT INR: INR: 3.6

## 2015-07-30 NOTE — Telephone Encounter (Signed)
Rx(s) sent to pharmacy electronically.  

## 2015-08-20 ENCOUNTER — Ambulatory Visit (INDEPENDENT_AMBULATORY_CARE_PROVIDER_SITE_OTHER): Payer: Medicare Other | Admitting: Pharmacist Clinician (PhC)/ Clinical Pharmacy Specialist

## 2015-08-20 DIAGNOSIS — Z954 Presence of other heart-valve replacement: Secondary | ICD-10-CM

## 2015-08-20 DIAGNOSIS — Z952 Presence of prosthetic heart valve: Secondary | ICD-10-CM

## 2015-08-20 DIAGNOSIS — Z7901 Long term (current) use of anticoagulants: Secondary | ICD-10-CM | POA: Diagnosis not present

## 2015-08-20 LAB — POCT INR: INR: 2.3

## 2015-09-01 ENCOUNTER — Other Ambulatory Visit: Payer: Self-pay | Admitting: Cardiovascular Disease

## 2015-09-10 ENCOUNTER — Ambulatory Visit (INDEPENDENT_AMBULATORY_CARE_PROVIDER_SITE_OTHER): Payer: Medicare Other | Admitting: Pharmacist Clinician (PhC)/ Clinical Pharmacy Specialist

## 2015-09-10 DIAGNOSIS — Z954 Presence of other heart-valve replacement: Secondary | ICD-10-CM

## 2015-09-10 DIAGNOSIS — Z7901 Long term (current) use of anticoagulants: Secondary | ICD-10-CM

## 2015-09-10 DIAGNOSIS — Z952 Presence of prosthetic heart valve: Secondary | ICD-10-CM

## 2015-09-10 LAB — POCT INR: INR: 2.7

## 2015-09-15 ENCOUNTER — Other Ambulatory Visit: Payer: Self-pay | Admitting: Cardiovascular Disease

## 2015-09-17 NOTE — Telephone Encounter (Signed)
Rx(s) sent to pharmacy electronically.  

## 2015-10-02 ENCOUNTER — Other Ambulatory Visit: Payer: Self-pay | Admitting: Cardiovascular Disease

## 2015-10-03 NOTE — Telephone Encounter (Signed)
Medication Refilled.

## 2015-10-08 ENCOUNTER — Ambulatory Visit (INDEPENDENT_AMBULATORY_CARE_PROVIDER_SITE_OTHER): Payer: Medicare Other | Admitting: Pharmacist Clinician (PhC)/ Clinical Pharmacy Specialist

## 2015-10-08 DIAGNOSIS — Z952 Presence of prosthetic heart valve: Secondary | ICD-10-CM

## 2015-10-08 DIAGNOSIS — Z954 Presence of other heart-valve replacement: Secondary | ICD-10-CM

## 2015-10-08 DIAGNOSIS — Z7901 Long term (current) use of anticoagulants: Secondary | ICD-10-CM | POA: Diagnosis not present

## 2015-10-08 LAB — POCT INR: INR: 2.8

## 2015-10-22 ENCOUNTER — Other Ambulatory Visit: Payer: Self-pay | Admitting: Cardiovascular Disease

## 2015-10-23 NOTE — Telephone Encounter (Signed)
Rx(s) sent to pharmacy electronically.  

## 2015-11-08 ENCOUNTER — Ambulatory Visit (INDEPENDENT_AMBULATORY_CARE_PROVIDER_SITE_OTHER): Payer: Medicare Other | Admitting: Pharmacist

## 2015-11-08 DIAGNOSIS — Z7901 Long term (current) use of anticoagulants: Secondary | ICD-10-CM | POA: Diagnosis not present

## 2015-11-08 DIAGNOSIS — Z952 Presence of prosthetic heart valve: Secondary | ICD-10-CM

## 2015-11-08 DIAGNOSIS — Z954 Presence of other heart-valve replacement: Secondary | ICD-10-CM | POA: Diagnosis not present

## 2015-11-08 LAB — POCT INR: INR: 3.6

## 2015-11-15 ENCOUNTER — Other Ambulatory Visit: Payer: Self-pay | Admitting: Orthopedic Surgery

## 2015-11-15 DIAGNOSIS — M19011 Primary osteoarthritis, right shoulder: Secondary | ICD-10-CM

## 2015-11-20 ENCOUNTER — Other Ambulatory Visit: Payer: Self-pay | Admitting: Orthopedic Surgery

## 2015-11-21 ENCOUNTER — Telehealth: Payer: Self-pay | Admitting: *Deleted

## 2015-11-21 NOTE — Telephone Encounter (Signed)
ROUTING TO PHARMACY TO ADDRESS.

## 2015-11-21 NOTE — Telephone Encounter (Signed)
Requesting surgical clearance:   1. Type of surgery: Right total shoulder   2. Surgeon: Dr Tania Ade  3. Surgical date: 12/27/2015  4. Medications that need to be held: coumadin  5. CAD: No     6. I will defer to: Dr Ar[facility] and [place]: Angela Nevin [phone]- fax [phone]- phone

## 2015-11-21 NOTE — Telephone Encounter (Signed)
Will need Lovenox bridge given her St. [name] MVR. These have Cyril Mourning arrange Otherwise cleared for her salt shoulder surgery.

## 2015-11-22 NOTE — Telephone Encounter (Signed)
Message routed to [place] via EPIC to

## 2015-11-22 NOTE — Telephone Encounter (Signed)
Message routed to [place] via EPIC to [phone].

## 2015-11-22 NOTE — Telephone Encounter (Signed)
Pt has appt July 7 for INR will set at that time

## 2015-11-26 ENCOUNTER — Ambulatory Visit
Admission: RE | Admit: 2015-11-26 | Discharge: 2015-11-26 | Disposition: A | Discharge status: Home / Self Care | Payer: Medicare Other | Source: Ambulatory Visit | Attending: Orthopedic Surgery | Admitting: Orthopedic Surgery

## 2015-11-26 DIAGNOSIS — M19011 Primary osteoarthritis, right shoulder: Secondary | ICD-10-CM

## 2015-12-06 ENCOUNTER — Ambulatory Visit (INDEPENDENT_AMBULATORY_CARE_PROVIDER_SITE_OTHER): Payer: Medicare Other | Admitting: Pharmacist Clinician (PhC)/ Clinical Pharmacy Specialist

## 2015-12-06 DIAGNOSIS — Z954 Presence of other heart-valve replacement: Secondary | ICD-10-CM

## 2015-12-06 DIAGNOSIS — Z952 Presence of prosthetic heart valve: Secondary | ICD-10-CM

## 2015-12-06 DIAGNOSIS — Z7901 Long term (current) use of anticoagulants: Secondary | ICD-10-CM

## 2015-12-06 LAB — POCT INR: INR: 3.8

## 2015-12-06 MED ORDER — ENOXAPARIN SODIUM 60 MG/0.6ML ~~LOC~~ SOLN: 60.0000 mg | SUBCUTANEOUS | Status: DC

## 2015-12-06 NOTE — Patient Instructions (Addendum)
Enoxaprin Dosing Schedule  Enoxparin dose: 60 mg once daily  Date  Warfarin Dose (evenings) Enoxaprin Dose  7-21 6 3.75 mg (1.5 tabs)   7-22 5 0   7-23 4 0   8 am  7-24 3 0   8 am  7-25 2 0   8 am  7-26 1 0   8 am  7-27 Procedure    7-28 1 3.75 mg (1.5 tabs)  8 am   7-29 2 5  mg  (2 tabs)  8 am  7-30 3 5  mg  (2 tabs)  8 am  7-31 4 5  mg (2 tabs)  8 am  8-1 5 3.75 mg (1.5 tabs)  8 am  8-2 6 Repeat INR

## 2015-12-19 ENCOUNTER — Encounter (HOSPITAL_COMMUNITY)
Admission: RE | Admit: 2015-12-19 | Discharge: 2015-12-19 | Disposition: A | Discharge status: Home / Self Care | Payer: Medicare Other | Source: Ambulatory Visit | Attending: Orthopedic Surgery | Admitting: Orthopedic Surgery

## 2015-12-19 ENCOUNTER — Encounter (HOSPITAL_COMMUNITY): Payer: Self-pay

## 2015-12-19 DIAGNOSIS — Z01812 Encounter for preprocedural laboratory examination: Secondary | ICD-10-CM | POA: Insufficient documentation

## 2015-12-19 HISTORY — DX: Other complications of anesthesia, initial encounter: T88.59XA

## 2015-12-19 HISTORY — DX: Hypothyroidism, unspecified: E03.9

## 2015-12-19 HISTORY — DX: Adverse effect of unspecified anesthetic, initial encounter: T41.45XA

## 2015-12-19 LAB — COMPREHENSIVE METABOLIC PANEL
ALBUMIN: 4 g/dL (ref 3.5–5.0)
ALK PHOS: 113 U/L (ref 38–126)
ALT: 16 U/L (ref 14–54)
ANION GAP: 9 (ref 5–15)
AST: 28 U/L (ref 15–41)
BILIRUBIN TOTAL: 0.6 mg/dL (ref 0.3–1.2)
BUN: 15 mg/dL (ref 6–20)
CALCIUM: 9.8 mg/dL (ref 8.9–10.3)
CO2: 31 mmol/L (ref 22–32)
Chloride: 96 mmol/L — ABNORMAL LOW (ref 101–111)
Creatinine, Ser: 1.31 mg/dL — ABNORMAL HIGH (ref 0.44–1.00)
GFR calc Af Amer: 46 mL/min — ABNORMAL LOW (ref >60)
GFR, EST NON AFRICAN AMERICAN: 39 mL/min — AB (ref >60)
GLUCOSE: 81 mg/dL (ref 65–99)
POTASSIUM: 3.9 mmol/L (ref 3.5–5.1)
Sodium: 136 mmol/L (ref 135–145)
TOTAL PROTEIN: 7.4 g/dL (ref 6.5–8.1)

## 2015-12-19 LAB — URINALYSIS, ROUTINE W REFLEX MICROSCOPIC
Bilirubin Urine: NEGATIVE
GLUCOSE, UA: NEGATIVE mg/dL
HGB URINE DIPSTICK: NEGATIVE
KETONES UR: NEGATIVE mg/dL
Leukocytes, UA: NEGATIVE
Nitrite: NEGATIVE
PROTEIN: NEGATIVE mg/dL
Specific Gravity, Urine: 1.011 (ref 1.005–1.030)
pH: 6 (ref 5.0–8.0)

## 2015-12-19 LAB — SURGICAL PCR SCREEN
MRSA, PCR: NEGATIVE
Staphylococcus aureus: NEGATIVE

## 2015-12-19 LAB — CBC WITH DIFFERENTIAL/PLATELET
Basophils Absolute: 0 10*3/uL (ref 0.0–0.1)
Basophils Relative: 0 %
Eosinophils Absolute: 0.3 10*3/uL (ref 0.0–0.7)
Eosinophils Relative: 2 %
HEMATOCRIT: 38.2 % (ref 36.0–46.0)
HEMOGLOBIN: 12.6 g/dL (ref 12.0–15.0)
LYMPHS PCT: 10 %
Lymphs Abs: 1.4 10*3/uL (ref 0.7–4.0)
MCH: 31 pg (ref 26.0–34.0)
MCHC: 33 g/dL (ref 30.0–36.0)
MCV: 93.9 fL (ref 78.0–100.0)
MONO ABS: 0.8 10*3/uL (ref 0.1–1.0)
MONOS PCT: 6 %
NEUTROS ABS: 11.3 10*3/uL — AB (ref 1.7–7.7)
NEUTROS PCT: 82 %
Platelets: 305 10*3/uL (ref 150–400)
RBC: 4.07 MIL/uL (ref 3.87–5.11)
RDW: 15.5 % (ref 11.5–15.5)
WBC: 13.9 10*3/uL — ABNORMAL HIGH (ref 4.0–10.5)

## 2015-12-19 NOTE — Pre-Procedure Instructions (Signed)
[name]  12/19/2015      [id number] [id number]: [phone]: [phone]   Your procedure is scheduled on July 27  [facility]  Call this number if you have problems the morning of surgery:  [phone]   Remember:  Do not eat food or drink liquids after midnight.  Take these medicines the morning of surgery with A SIP OF WATER tylenol, allopurinol, amodarone, gabapentin, levothyroxine, metoprolol, omeprazole (prilosec), proair inhaler, zoloft, tramadol if needed  7 days prior to surgery STOP taking any Aspirin, Aleve, Naproxen, Ibuprofen, Motrin, Advil, Goody's, BC's, all herbal medications, fish oil, and all vitamins  DO NOT TAKE LOVENOX DAY OF SURGERY   Do not wear jewelry, make-up or nail polish.  Do not wear lotions, powders, or perfumes.  You may NOT wear deoderant.  Do not shave 48 hours prior to surgery.   Do not bring valuables to the hospital.  [facility] any belongings or [facility]ables.  Contacts, dentures or bridgework may not be worn into surgery.  Leave your suitcase in the car.  After surgery it may be brought to your room.  For patients admitted to the hospital, discharge time will be determined by your treatment team.  Patients discharged the day of surgery will not be allowed to drive home.    Special instructions:   - Preparing For Surgery  Before surgery, you can play an important role. Because skin is not sterile, your skin needs to be as free of germs as possible. You can reduce the number of germs on your skin by washing with CHG (chlorahexidine gluconate) Soap before surgery.  CHG is an antiseptic cleaner which kills germs and bonds with the skin to continue killing germs even after washing.  Please do not use if you have an allergy to CHG or  antibacterial soaps. If your skin becomes reddened/irritated stop using the CHG.  Do not shave (including legs and underarms) for at least 48 hours prior to first CHG shower. It is OK to shave your face.  Please follow these instructions carefully.   1. Shower the NIGHT BEFORE SURGERY and the MORNING OF SURGERY with CHG.   2. If you chose to wash your hair, wash your hair first as usual with your normal shampoo.  3. After you shampoo, rinse your hair and body thoroughly to remove the shampoo.  4. Use CHG as you would any other liquid soap. You can apply CHG directly to the skin and wash gently with a scrungie or a clean washcloth.   5. Apply the CHG Soap to your body ONLY FROM THE NECK DOWN.  Do not use on open wounds or open sores. Avoid contact with your eyes, ears, mouth and genitals (private parts). Wash genitals (private parts) with your normal soap.  6. Wash thoroughly, paying special attention to the area where your surgery will be performed.  7. Thoroughly rinse your body with warm water from the neck down.  8. DO NOT shower/wash with your normal soap after using and rinsing off the CHG Soap.  9. Pat yourself dry with a CLEAN TOWEL.   10. Wear CLEAN PAJAMAS   11. Place CLEAN SHEETS on your bed the night of your first shower and DO NOT SLEEP WITH PETS.    Day of  Surgery: Do not apply any deodorants/lotions. Please wear clean clothes to the hospital/surgery center.      Please read over the following fact sheets that you were given. Pain Booklet, Coughing and Deep Breathing, MRSA Information and Surgical Site Infection Prevention

## 2015-12-19 NOTE — Progress Notes (Signed)
PCP - Leanna Battles Cardiologist - Quay Burow PPM/ICD- [place] has appointment with him 12/20/15  Chest x-ray - not needed EKG - 01/26/15 Stress Test - denies ECHO - 08/25/14 Cardiac Cath - 04/03/2006  Patient had difficulty being excubated in 09/2013 from previous surgery - spoke with anesthesia and they will review her chart to follow up with that  Faxed ICD/PPM paper work to device clinic  Stopping coumain on 7/21 Starting lovenox bridge on 7/23  Needs PT/INR, PTT DOS    Patient denies shortness of breath, fever, cough and chest pain at PAT appointment

## 2015-12-20 ENCOUNTER — Ambulatory Visit (INDEPENDENT_AMBULATORY_CARE_PROVIDER_SITE_OTHER): Payer: Medicare Other | Admitting: Cardiovascular Disease

## 2015-12-20 ENCOUNTER — Encounter: Payer: Self-pay | Admitting: Cardiovascular Disease

## 2015-12-20 ENCOUNTER — Ambulatory Visit (INDEPENDENT_AMBULATORY_CARE_PROVIDER_SITE_OTHER): Payer: Medicare Other | Admitting: Pharmacist Clinician (PhC)/ Clinical Pharmacy Specialist

## 2015-12-20 ENCOUNTER — Encounter (HOSPITAL_COMMUNITY): Payer: Self-pay

## 2015-12-20 ENCOUNTER — Telehealth: Payer: Self-pay | Admitting: Cardiology

## 2015-12-20 VITALS — BP 140/80 | HR 77 | Ht 62.0 in | Wt 119.2 lb

## 2015-12-20 DIAGNOSIS — Z95810 Presence of automatic (implantable) cardiac defibrillator: Secondary | ICD-10-CM | POA: Diagnosis not present

## 2015-12-20 DIAGNOSIS — Z954 Presence of other heart-valve replacement: Secondary | ICD-10-CM | POA: Diagnosis not present

## 2015-12-20 DIAGNOSIS — Z7901 Long term (current) use of anticoagulants: Secondary | ICD-10-CM

## 2015-12-20 DIAGNOSIS — Z952 Presence of prosthetic heart valve: Secondary | ICD-10-CM

## 2015-12-20 LAB — CUP PACEART INCLINIC DEVICE CHECK
Implantable Lead Implant Date: 20080221
Implantable Lead Implant Date: 20080221
Implantable Lead Implant Date: 20080221
Implantable Lead Location: 753858
Implantable Lead Location: 753860
Implantable Lead Model: 7002
Implantable Lead Serial Number: 134379
Lead Channel Setting Sensing Sensitivity: 0.3 mV
MDC IDC LEAD LOCATION: 753859
MDC IDC SESS DTM: 20170724145936
MDC IDC SET LEADCHNL LV PACING AMPLITUDE: 1.75 V
MDC IDC SET LEADCHNL LV PACING PULSEWIDTH: 0.4 ms
MDC IDC SET LEADCHNL RA PACING AMPLITUDE: 1.5 V
MDC IDC SET LEADCHNL RV PACING AMPLITUDE: 2.25 V
MDC IDC SET LEADCHNL RV PACING PULSEWIDTH: 0.4 ms

## 2015-12-20 LAB — POCT INR: INR: 3.3

## 2015-12-20 NOTE — Patient Instructions (Signed)
Medication Instructions: Dr Sallyanne Kuster recommends that you continue on your current medications as directed. Please refer to the Current Medication list given to you today.  Labwork: NONE ORDERED  Testing/Procedures: 1. Echocardiogram - Your physician has requested that you have an echocardiogram. Echocardiography is a painless test that uses sound waves to create images of your heart. It provides your doctor with information about the size and shape of your heart and how well your heart's chambers and valves are working. This procedure takes approximately one hour. There are no restrictions for this procedure. This will be done at our [facility] location - [address].  Follow-up: Dr Sallyanne Kuster recommends that you schedule a follow-up appointment in 3 months with a device check.  If you need a refill on your cardiac medications before your next appointment, please call your pharmacy.1

## 2015-12-20 NOTE — Progress Notes (Signed)
Cardiology Office Note    Date:  12/22/2015   ID:  [name], DOB 11-06-1940, MRN [id number]  PCP:  [name], MD  Cardiologist:  Quay Burow, M.D.; Sanda Klein, MD   Chief Complaint  Patient presents with  . Follow-up    Pt states no Sx.  Preoperative evaluation  History of Present Illness:  [name] is a 75 y.o. female here for follow-up on her biventricular pacemaker/defibrillator.  She is planning to undergo right shoulder surgery on July 27, with plans for left shoulder surgery later. Sounds that she'll require bilateral prostheses. She is on chronic warfarin anticoagulation for mitral valve replacement with a mechanical prosthesis and atrial fibrillation. She already has a plan in place for enoxaparin self-administered injections as "bridge" around her surgery.  He denies problems with angina, dyspnea, palpitations, syncope, edema, focal neurological events or any bleeding problems.  We have not been receiving her remote downloads. The device check today shows normal function. She has a Artist XT CRT-D implanted in 2012. Generator voltage is 2.83 V (CRI 2.63 V in (She has 99.8% biventricular pacing. She also has 94% atrial pacing. Her thoracic impedance recordings are stable without evidence of fluid overload. She has not had atrial fibrillation or ventricular tachycardia. She is not pacemaker dependent.  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 Left ventricular ejection fraction which has been as low as 10% improve substantially after implantation of the CRT-D device and now her LVEF is normal. She received her initial CRT-D in 2008 and underwent a generator change out in 2012. She has not received VT therapy. She has 100% biventricular pacing efficiency. She has a Riata 7 [place] ICD lead under advisory,  which appeared fluoroscopically normal at the time of for generator change. Did not have any angiographic CAD prior to her surgery.   Past Medical History  Diagnosis Date  . Colon polyp     adenomatous  . Hemorrhoids   . GERD (gastroesophageal reflux disease)   . Hyperlipidemia   . Bursitis   . Diverticulosis   . Anemia   . MVP (mitral valve prolapse)   . CHF (congestive heart failure) ([place])   . Chronic kidney disease   . Fibromyalgia   . COPD (chronic obstructive pulmonary disease) ([place])   . IBS (irritable bowel syndrome)   . Peptic ulcer disease   . H/O mitral valve replacement 2002, 2007  . Biventricular ICD (implantable cardioverter-defibrillator) in place     with CRT  . Dysrhythmia   . Automatic implantable cardioverter-defibrillator in situ     Medtronic Protecta  . Pacemaker   . Gout   . Anxiety   . Arthritis   . Asthma   . Blood transfusion without reported diagnosis   . Cataract     RIGHT EYE  . Heart murmur   . Hypothyroidism   . Complication of anesthesia     patient stated that had difficulty getting the breathing tube removed, patient said that she stopped breathing and HR dropped to 10  patient then woke up and started breathing pateint stated no longer than one minute; re-intubated in PACU following cholecystectomy 10/28/13    Past Surgical History  Procedure Laterality Date  . Abdominal hysterectomy    . Mitral valve replacement  02/26/2006    re-do MVR w/91m St. [name] (Dr. BEllison Hughs  .  Cardiac defibrillator placement  2007, 11/202012    x2 (pacemaker) (Dr. Jerilynn Mages. Janissa Bertram)  . Cardiac valve replacement  2002    MV repair - Dr. Berle Mull  . Cholecystectomy N/A 10/28/2013    Procedure: LAPAROSCOPIC CHOLECYSTECTOMY WITH INTRAOPERATIVE CHOLANGIOGRAM;  Surgeon: Edward Jolly, MD;  Location: [facility];  Service: General;  Laterality: N/A;  . Transthoracic echocardiogram  12/2011    EF 50-55%, mild global hypokinesis; LA severely dilated; calcification  of anterior/posterior MV leaflets, bi-leaflets St. [name] mechanical MV; mild TR; trace AV regurg/pulm valve regurg  . Nm myocar perf wall motion  2005    persantine myoview - low ris, EF 63%  . Cardiac catheterization  02/25/2006    normal left main, normal LAD, normal L Cfx, normal/dominant RCA (Dr. [name])  . Right heart cath  04/03/2006    pulm cap wedge pressure 24/24, PA pressure 43/22 (mean 69mHg), CO 4.8, CI 4.1 (Dr. JJackie Plum  . Colonoscopy    . Implantable cardioverter defibrillator (icd) generator change N/A 04/10/2011    Procedure: ICD GENERATOR CHANGE;  Surgeon: MSanda Klein MD;  [facility];  Service: Cardiovascular;  Laterality: N/A;  . Eye surgery    . Knee arthroscopy      Current Medications: Outpatient Prescriptions Prior to Visit  Medication Sig Dispense Refill  . acetaminophen (TYLENOL) 325 MG tablet Take 650 mg by mouth every 6 (six) hours as needed for mild pain.    .Marland Kitchenallopurinol (ZYLOPRIM) 300 MG tablet Take 300 mg by mouth every evening.     .Marland Kitchenamiodarone (PACERONE) 200 MG tablet TAKE 1/2 TABLET BY MOUTH DAILY 30 tablet 1  . amoxicillin (AMOXIL) 500 MG capsule TAKE 4 CAPSULES 1 HOUR PRIOR TO DENTAL PROCEDURES 4 capsule 1  . enoxaparin (LOVENOX) 60 MG/0.6ML injection Inject 0.6 mLs (60 mg total) into the skin daily. 10 Syringe 1  . furosemide (LASIX) 40 MG tablet TAKE 2 TABLETS BY MOUTH EVERY MORNING AND 1 TAB EVERY AFTERNOON 90 tablet 4  . gabapentin (NEURONTIN) 300 MG capsule Take 300 mg by mouth 2 (two) times daily.     .Marland Kitchenlevothyroxine (SYNTHROID, LEVOTHROID) 50 MCG tablet TAKE 1 TABLET (50 MCG TOTAL) BY MOUTH DAILY BEFORE BREAKFAST. 30 tablet 6  . metoprolol tartrate (LOPRESSOR) 25 MG tablet Take 25 mg by mouth daily.     . Multiple Vitamins-Minerals (MULTIVITAMINS THER. W/MINERALS) TABS Take 1 tablet by mouth every morning.     .Marland Kitchenomeprazole (PRILOSEC) 40 MG capsule TAKE ONE CAPSULE BY MOUTH EVERY DAY 30 capsule 8  . pravastatin (PRAVACHOL) 40 MG  tablet Take 40 mg by mouth daily.    .Marland KitchenPROAIR HFA 108 (90 BASE) MCG/ACT inhaler Inhale 1-2 puffs into the lungs every 6 (six) hours as needed for wheezing.     . sertraline (ZOLOFT) 25 MG tablet Take 50 mg by mouth every morning.     .Marland Kitchenspironolactone (ALDACTONE) 25 MG tablet TAKE 1 TABLET BY MOUTH EVERY DAY 30 tablet 6  . traMADol (ULTRAM) 50 MG tablet Take 50 mg by mouth every 6 (six) hours as needed for moderate pain.     .Marland Kitchenwarfarin (COUMADIN) 2.5 MG tablet Take 1 to 1.5 tablets by mouth daily as directed by coumadin clinic (Patient taking differently: Take 2.5-3.75 mg by mouth See admin instructions. Pt takes 3.787mMWF - pt takes 2.56m28mll other days) 135 tablet 1   No facility-administered medications prior to visit.     Allergies:   Ace inhibitors;  Codeine; and Lipitor   Social History   Social History  . Marital Status: Married    Spouse Name: N/A  . Number of Children: 4  . Years of Education: N/A   Occupational History  . retired    Social History Main Topics  . Smoking status: Former Smoker    Types: Cigarettes    Quit date: 06/02/2001  . Smokeless tobacco: Never Used  . Alcohol Use: 0.0 oz/week    0 Standard drinks or equivalent per week     Comment: social-1 every 6 months  . Drug Use: No  . Sexual Activity: Not Asked   Other Topics Concern  . None   Social History Narrative     Family History:  The patient's family history includes Breast cancer in her sister; Crohn's disease in her sister; Heart attack in her father and mother; Heart disease in her father, mother, sister, and son; Kidney disease in her father; Uterine cancer in her sister.   ROS:   Please see the history of present illness.    ROS All other systems reviewed and are negative.   PHYSICAL EXAM:   VS:  BP 140/80 mmHg  Pulse 77  Ht 5' 2"  (1.575 m)  Wt 54.069 kg (119 lb 3.2 oz)  BMI 21.80 kg/m2   GEN: Well nourished, well developed, in no acute distress HEENT: normal Neck: no JVD,  carotid bruits, or masses Cardiac: Crisp mechanical valve clicks RRR; no murmurs, rubs, or gallops,no edema, healthy left subclavian ICD site  Respiratory:  clear to auscultation bilaterally, normal work of breathing GI: soft, nontender, nondistended, + BS MS: no deformity or atrophy Skin: warm and dry, no rash Neuro:  Alert and Oriented x 3, Strength and sensation are intact Psych: euthymic mood, full affect  Wt Readings from Last 3 Encounters:  12/20/15 54.069 kg (119 lb 3.2 oz)  12/19/15 54.159 kg (119 lb 6.4 oz)  01/26/15 55.339 kg (122 lb)      Studies/Labs Reviewed:   EKG:  EKG is ordered today.  The ekg ordered today demonstrates 100% atrial paced, ventricular paced with a prominent positive R wave in lead V1, QTC 528 ms  Recent Labs: 06/08/2015: TSH 4.198 12/19/2015: ALT 16; BUN 15; Creatinine, Ser 1.31*; Hemoglobin 12.6; Platelets 305; Potassium 3.9; Sodium 136   Lipid Panel    Component Value Date/Time   CHOL * 12/09/2006 0410    318        ATP III CLASSIFICATION:  <200     mg/dL   Desirable  200-239  mg/dL   Borderline High  >=240    mg/dL   High   TRIG 225* 12/09/2006 0410   HDL 39* 12/09/2006 0410   CHOLHDL 8.2 12/09/2006 0410   VLDL 45* 12/09/2006 0410   LDLCALC * 12/09/2006 0410    234        Total Cholesterol/HDL:CHD Risk Coronary Heart Disease Risk Table                     Men   Women  1/2 Average Risk   3.4   3.3      ASSESSMENT:    1. History of mitral valve replacement with mechanical valve   2. Automatic implantable cardioverter-defibrillator in situ      PLAN:  In order of problems listed above:  1. PreOperative exam: She is at low risk for major hemodynamic cardiac arrest or complications with surgery, but due to her mechanical mitral valve prosthesis  is at increased bleeding risk and increased risk of embolic events. She has previously had enoxaparin "bridging" uneventfully. A magnet should be taped to her defibrillator during the  operation on her right shoulder. When the operation is performed on her left shoulder, I think it would be best that she had has the device reprogrammed and cautery use should be minimal. 2. CRT-D: Normal function of her biventricular pacemaker defibrillator. She is not pacemaker dependent. The device was implanted for congestive heart failure due to dyssynchrony. She is a "hyper responder" with her LVEF increasing from 10% to normal after resynchronization therapy (last echo 2016). Will need to troubleshoot why we are not receiving her remote downloads. She may need a new transmitter. 3. S/P MVR mechanical prosthesis: Normal function of the prosthesis by her last echo. Repeat echo this year. 4. AFib: Not recorded by her device in a long time. On amiodarone therapy needs to have liver function tests and thyroid function tests periodically (LFTs just checked, last TSH in January was normal).    Medication Adjustments/Labs and Tests Ordered: Current medicines are reviewed at length with the patient today.  Concerns regarding medicines are outlined above.  Medication changes, Labs and Tests ordered today are listed in the Patient Instructions below. Patient Instructions  Medication Instructions: Dr Sallyanne Kuster recommends that you continue on your current medications as directed. Please refer to the Current Medication list given to you today.  Labwork: NONE ORDERED  Testing/Procedures: 1. Echocardiogram - Your physician has requested that you have an echocardiogram. Echocardiography is a painless test that uses sound waves to create images of your heart. It provides your doctor with information about the size and shape of your heart and how well your heart's chambers and valves are working. This procedure takes approximately one hour. There are no restrictions for this procedure. This will be done at our [facility] location - [address].  Follow-up: Dr Sallyanne Kuster recommends that you  schedule a follow-up appointment in 3 months with a device check.  If you need a refill on your cardiac medications before your next appointment, please call your pharmacy.1     Signed, Sanda Klein, MD  12/22/2015 6:11 PM    [address], [place], [place]  [address] Phone: [phone]; Fax: [phone]

## 2015-12-20 NOTE — Telephone Encounter (Signed)
LMOVM for pt to return call in regards to her home monitor.

## 2015-12-20 NOTE — Progress Notes (Addendum)
Anesthesia Chart Review: Patient is a 75 year old female scheduled for right total shoulder arthroplasty on 12/27/15 by Dr. Tamera Punt.  History includes former smoker, MV s/p repair (posterior mitral annuloplasty via right thoracotomy approach '02 (Dr. Amador Cunas) and s/p St. 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 Cyndia Bent) with cardiogenic shock in the setting of biventricular failure/GI bleed 04/03/06 s/p AIBP and transfer to [facility], s/p Medtronic ICD/CRT-D 07/23/06 (generator change 04/10/11), non-ischemic cardiomyopathy, CHF, dysrhythmia (not specified), GERD, HLD, anemia, CKD, COPD, fibromyalgia, hypothyroidism, IBS, PUD, anxiety, asthma, hysterectomy, cholecystectomy.   ANESTHESIA COMPLICATION: She required re-intubation 10/28/13. - 10/28/13 7:21 AM: Induction for cholecystectomy. (Intraoperative meds given: sevoflurane, fentanyl, lidocaine, propofol, rocuronium, ondansetron, neostigmine, glycopyrrolate, cefazolin, phenylephrine, dexamethasone.). Extubated 8:43 AM. - 10/28/13 8:51 AM: Patient awake, oriented, cooperative with spontaneous breathing on O2/[place]. - 10/28/13 9:15 AM: Patient was no longer following commands or responding, unable to grip or move legs or open eyes; Vitals stable. Anesthesiologist Dr. Linna Caprice called to bedside and began bagging patient but no improvement by 9:30 AM. Anesthesiologist Dr. Tamala Julian also at beside and Narcan given without improvement, so decision was made to intubate at [place]. By 10:00 AM patient still not following commands but starting to respond to pain and voice. PCXR showed mild interstital edema, patchy bilateral airspace disease which could represent alveolar edema or concurrent infection/aspiration. EF within the previous year was > 55%. 10:55 AM PT began ventilator weaning and put on pressure support. 11:00 AM Patient following commands and able to move all extremities. Dr. Tamala Julian wanted to keep on pressure support a little while longer to give  additional time for anesthetic reversal. - 10/28/13 11:55 AM: Patient awake and wanted ETT out. Extubated without difficultly.  - PCP is Dr. Leanna Battles. - Primary cardiologist is Dr. Gwenlyn Found. He recommended Lovenox bridge given her St. [name] MVR but otherwise he felt she was cleared for shoulder surgery (see 11/21/15 telephone encounter). - EP cardiologist is Dr. Sallyanne Kuster, last visit 12/20/15 (note not yet completed).  - GI is Dr. Henrene Pastor.  Meds include amiodarone, amoxicillin PRN, Lasix, Neurontin, levothyroxine, Lopressor, Prilosec, pravastatin, ProAir, Zoloft, Aldactone, tramadol, warfain (hold hold 12/21/15; starting Lovenox bridge 12/23/15).  12/20/15 EKG: PENDING (to be done at [facility] 01/26/15 tracing showed AV dual paced rhythm, biventricular pacemaker detected.  08/25/14 Echo: Study Conclusions - Left ventricle: The cavity size was normal. Wall thickness wasnormal. The estimated ejection fraction was 55%. Although no diagnostic regional wall motion abnormality was identified, this possibility cannot be completely excluded on the basis of this study. Indeterminant diastolic function. - Aortic valve: There was no stenosis. There was trivial regurgitation. - Mitral valve: There is a mechanical mitral valve prosthesis. Mechanical MV appears to function normally, mean gradient across the valve is not elevated. There was trivial regurgitation. Mean gradient (D): 4 mm Hg. - Left atrium: Poorly visualized. The atrium was mildly dilated. - Right ventricle: Poorly visualized. Probably normal size and systolic function. Pacer wire or catheter noted in right ventricle. - Tricuspid valve: Peak RV-RA gradient (S): 22 mm Hg. - Pulmonary arteries: PA peak pressure: 25 mm Hg (S). - Inferior vena cava: The vessel was normal in size. The respirophasic diameter changes were in the normal range (= 50%), consistent with normal central venous pressure. Impressions: - Normal LV size and systolic function,  EF 13%. Mechanical mitral valve appears to function normally. RV poorly visualized, probably normal.  02/25/06 RHC/LHC: Normal coronaries. LVEF 20-25%.  IMPRESSION: Ms. Selbe has normal coronary arteries and severe  leftventricular dysfunction with elevated left heart filling pressures and decreased cardiac output. She is scheduled for mitral valve replacement tomorrow. She is a good candidate for biventricular implantable cardioverter-defibrillator. She is scheduled to have Dr. [name] see her today for electrophysiologic evaluation. (Also had RHC 04/03/06 in the setting of cardiogenic shock with transfer to [facility].)  Preoperative labs noted. Glucose 81. Cr 1.31. WBC 13.9 (voice message left with Carla at Dr. Bettina Gavia office), H/H 12.6/38.2. UA WNL. INR today was 3.3. Needs PT/PTT on the day of surgery. (Update: Dr. Horald Pollen requests CXR prior to surgery. Angela Nevin will work on patient getting done here or at [facility].)  Anesthesia history reviewed with Dr. Stevphen Rochester.   I will plan to follow-up on Dr. Victorino December office note from today once available but based on currently available records I would anticipate that she could proceed as planned. Chart left for RN follow-up regarding perioperative ICD RX form from Dr. Sallyanne Kuster.  George Hugh [facility] Short Stay Center/Anesthesiology Phone [phone] 12/20/2015 3:30 PM  Addendum:  12/24/15 CXR: FINDINGS: Prior median sternotomy and valve replacement. Left AICD remains in place, unchanged. There is hyperinflation of the lungs compatible with COPD. Heart and mediastinal contours are within normal limits. No focal opacities or effusions. No acute bony abnormality.  IMPRESSION: COPD.  No active disease.  12/20/15 office note by Dr. Sallyanne Kuster reviewed. In regards to her pre-operative evaluation he wrote, "She is at low risk for major hemodynamic cardiac arrest or complications with surgery, but due to her mechanical mitral valve prosthesis is  at increased bleeding risk and increased risk of embolic events. She has previously had enoxaparin "bridging" uneventfully. A magnet should be taped to her defibrillator during the operation on her right shoulder. When the operation is performed on her left shoulder, I think it would be best that she had has the device reprogrammed and cautery use should be minimal." By notes, she is not pacer dependent. He ordered a routine echo to be done later this year.   Further evaluation of patient and review of follow-up labs (PT/PTT) by her surgeon and anesthesiologist on the day of surgery to ensure stable for her scheduled surgery.    George Hugh [facility] Short Stay Center/Anesthesiology Phone [phone] 12/24/2015 6:35 PM

## 2015-12-21 ENCOUNTER — Other Ambulatory Visit: Payer: Self-pay | Admitting: Orthopedic Surgery

## 2015-12-21 ENCOUNTER — Telehealth: Payer: Self-pay

## 2015-12-21 NOTE — Telephone Encounter (Signed)
Spoke w/ pt and she informed me that she only has 1 green light in the middle of the wire-x adapter. Pt stated that the monitor was near a window, and that they get good cellular signal in their home. Instructed pt that she could move it around her home to try and find better signal. Pt stated that the other rooms were on the other side of the house. Pt stated that they have Internet. I informed her that I could order a wire-t which looks like the wire-x but it plugs into the Internet modem. I informed her that it should be no extra charges to her Internet bill to do this. Pt verbalized understanding and agreed to order the wire-t. Ordered it for her today and informed her that it would be there in about 7-10 business days. Informed her that if this didn't work she may need to come into the office every 3 months to get her ICD interrogated.

## 2015-12-21 NOTE — Telephone Encounter (Signed)
Faxed signed perioperative prescription for implanted cardiac device programming form to [facility] Admissions dept.

## 2015-12-24 ENCOUNTER — Ambulatory Visit (HOSPITAL_COMMUNITY)
Admission: RE | Admit: 2015-12-24 | Discharge: 2015-12-24 | Disposition: A | Discharge status: Home / Self Care | Payer: Medicare Other | Source: Ambulatory Visit | Attending: Orthopedic Surgery | Admitting: Orthopedic Surgery

## 2015-12-24 ENCOUNTER — Encounter (HOSPITAL_COMMUNITY)
Admission: RE | Admit: 2015-12-24 | Discharge: 2015-12-24 | Disposition: A | Discharge status: Home / Self Care | Payer: Medicare Other | Source: Ambulatory Visit | Attending: Orthopedic Surgery | Admitting: Orthopedic Surgery

## 2015-12-24 DIAGNOSIS — Z01818 Encounter for other preprocedural examination: Secondary | ICD-10-CM

## 2015-12-24 DIAGNOSIS — J449 Chronic obstructive pulmonary disease, unspecified: Secondary | ICD-10-CM | POA: Insufficient documentation

## 2015-12-26 MED ORDER — CEFAZOLIN SODIUM-DEXTROSE 2-4 GM/100ML-% IV SOLN
2.0000 g | INTRAVENOUS | Status: AC
  Administered 2015-12-27: 2 g via INTRAVENOUS
  Filled 2015-12-26: qty 100

## 2015-12-26 NOTE — Progress Notes (Signed)
Pt. Informed of time change. Instructed to arrive at 1100.

## 2015-12-27 ENCOUNTER — Inpatient Hospital Stay (HOSPITAL_COMMUNITY): Payer: Medicare Other

## 2015-12-27 ENCOUNTER — Inpatient Hospital Stay (HOSPITAL_COMMUNITY): Payer: Medicare Other | Admitting: Anesthesiology

## 2015-12-27 ENCOUNTER — Encounter (HOSPITAL_COMMUNITY): Payer: Self-pay | Admitting: Surgery

## 2015-12-27 ENCOUNTER — Inpatient Hospital Stay (HOSPITAL_COMMUNITY): Payer: Medicare Other | Admitting: Vascular Surgery

## 2015-12-27 ENCOUNTER — Encounter (HOSPITAL_COMMUNITY)
Admission: RE | Disposition: A | Discharge status: Home / Self Care | Payer: Self-pay | Source: Ambulatory Visit | Attending: Orthopedic Surgery

## 2015-12-27 ENCOUNTER — Inpatient Hospital Stay (HOSPITAL_COMMUNITY)
Admission: RE | Admit: 2015-12-27 | Discharge: 2015-12-28 | DRG: 483 | Disposition: A | Discharge status: Home / Self Care | Payer: Medicare Other | Source: Ambulatory Visit | Attending: Orthopedic Surgery | Admitting: Orthopedic Surgery

## 2015-12-27 DIAGNOSIS — N189 Chronic kidney disease, unspecified: Secondary | ICD-10-CM | POA: Diagnosis present

## 2015-12-27 DIAGNOSIS — Z7901 Long term (current) use of anticoagulants: Secondary | ICD-10-CM

## 2015-12-27 DIAGNOSIS — D62 Acute posthemorrhagic anemia: Secondary | ICD-10-CM | POA: Diagnosis not present

## 2015-12-27 DIAGNOSIS — I509 Heart failure, unspecified: Secondary | ICD-10-CM | POA: Diagnosis present

## 2015-12-27 DIAGNOSIS — Z885 Allergy status to narcotic agent status: Secondary | ICD-10-CM

## 2015-12-27 DIAGNOSIS — Z96619 Presence of unspecified artificial shoulder joint: Secondary | ICD-10-CM

## 2015-12-27 DIAGNOSIS — J449 Chronic obstructive pulmonary disease, unspecified: Secondary | ICD-10-CM | POA: Diagnosis present

## 2015-12-27 DIAGNOSIS — Z888 Allergy status to other drugs, medicaments and biological substances status: Secondary | ICD-10-CM

## 2015-12-27 DIAGNOSIS — I341 Nonrheumatic mitral (valve) prolapse: Secondary | ICD-10-CM | POA: Diagnosis present

## 2015-12-27 DIAGNOSIS — F419 Anxiety disorder, unspecified: Secondary | ICD-10-CM | POA: Diagnosis present

## 2015-12-27 DIAGNOSIS — M19011 Primary osteoarthritis, right shoulder: Secondary | ICD-10-CM | POA: Diagnosis present

## 2015-12-27 DIAGNOSIS — Z95810 Presence of automatic (implantable) cardiac defibrillator: Secondary | ICD-10-CM

## 2015-12-27 DIAGNOSIS — Z79899 Other long term (current) drug therapy: Secondary | ICD-10-CM

## 2015-12-27 DIAGNOSIS — Z8711 Personal history of peptic ulcer disease: Secondary | ICD-10-CM

## 2015-12-27 DIAGNOSIS — E785 Hyperlipidemia, unspecified: Secondary | ICD-10-CM | POA: Diagnosis present

## 2015-12-27 DIAGNOSIS — E039 Hypothyroidism, unspecified: Secondary | ICD-10-CM | POA: Diagnosis present

## 2015-12-27 DIAGNOSIS — K219 Gastro-esophageal reflux disease without esophagitis: Secondary | ICD-10-CM | POA: Diagnosis present

## 2015-12-27 DIAGNOSIS — Z96611 Presence of right artificial shoulder joint: Secondary | ICD-10-CM

## 2015-12-27 HISTORY — PX: TOTAL SHOULDER ARTHROPLASTY: SHX126

## 2015-12-27 LAB — PROTIME-INR
INR: 1.63
PROTHROMBIN TIME: 19.5 s — AB (ref 11.4–15.2)

## 2015-12-27 LAB — APTT: APTT: 37 s — AB (ref 24–36)

## 2015-12-27 SURGERY — ARTHROPLASTY, SHOULDER, TOTAL
Anesthesia: General | Site: Shoulder | Laterality: Right

## 2015-12-27 MED ORDER — PHENOL 1.4 % MT LIQD: 1.0000 | OROMUCOSAL | Status: DC | PRN

## 2015-12-27 MED ORDER — PROPOFOL 10 MG/ML IV BOLUS
INTRAVENOUS | Status: DC | PRN
  Administered 2015-12-27: 100 mg via INTRAVENOUS

## 2015-12-27 MED ORDER — FENTANYL CITRATE (PF) 100 MCG/2ML IJ SOLN
INTRAMUSCULAR | Status: DC | PRN
  Administered 2015-12-27: 100 ug via INTRAVENOUS

## 2015-12-27 MED ORDER — METOCLOPRAMIDE HCL 5 MG PO TABS: 5.0000 mg | ORAL_TABLET | Freq: Three times a day (TID) | ORAL | Status: DC | PRN

## 2015-12-27 MED ORDER — FENTANYL CITRATE (PF) 100 MCG/2ML IJ SOLN
50.0000 ug | Freq: Once | INTRAMUSCULAR | Status: AC
  Administered 2015-12-27: 50 ug via INTRAVENOUS

## 2015-12-27 MED ORDER — MENTHOL 3 MG MT LOZG: 1.0000 | LOZENGE | OROMUCOSAL | Status: DC | PRN

## 2015-12-27 MED ORDER — ROCURONIUM BROMIDE 50 MG/5ML IV SOLN
INTRAVENOUS | Status: AC
  Filled 2015-12-27: qty 1

## 2015-12-27 MED ORDER — ONDANSETRON HCL 4 MG/2ML IJ SOLN: 4.0000 mg | Freq: Four times a day (QID) | INTRAMUSCULAR | Status: DC | PRN

## 2015-12-27 MED ORDER — FLEET ENEMA 7-19 GM/118ML RE ENEM: 1.0000 | ENEMA | Freq: Once | RECTAL | Status: DC | PRN

## 2015-12-27 MED ORDER — MIDAZOLAM HCL 2 MG/2ML IJ SOLN
INTRAMUSCULAR | Status: AC
  Filled 2015-12-27: qty 2

## 2015-12-27 MED ORDER — GABAPENTIN 300 MG PO CAPS
300.0000 mg | ORAL_CAPSULE | Freq: Two times a day (BID) | ORAL | Status: DC
  Administered 2015-12-27 – 2015-12-28 (×2): 300 mg via ORAL
  Filled 2015-12-27 (×2): qty 1

## 2015-12-27 MED ORDER — DEXAMETHASONE SODIUM PHOSPHATE 10 MG/ML IJ SOLN
INTRAMUSCULAR | Status: AC
  Filled 2015-12-27: qty 1

## 2015-12-27 MED ORDER — SUGAMMADEX SODIUM 200 MG/2ML IV SOLN
INTRAVENOUS | Status: DC | PRN
Start: 2015-12-27 — End: 2015-12-27
  Administered 2015-12-27: 110 mg via INTRAVENOUS

## 2015-12-27 MED ORDER — LIDOCAINE HCL (CARDIAC) 20 MG/ML IV SOLN
INTRAVENOUS | Status: DC | PRN
  Administered 2015-12-27: 100 mg via INTRAVENOUS

## 2015-12-27 MED ORDER — ONDANSETRON HCL 4 MG PO TABS: 4.0000 mg | ORAL_TABLET | Freq: Four times a day (QID) | ORAL | Status: DC | PRN

## 2015-12-27 MED ORDER — BUPIVACAINE-EPINEPHRINE (PF) 0.5% -1:200000 IJ SOLN
INTRAMUSCULAR | Status: DC | PRN
  Administered 2015-12-27: 25 mL

## 2015-12-27 MED ORDER — LEVOTHYROXINE SODIUM 50 MCG PO TABS
50.0000 ug | ORAL_TABLET | Freq: Every day | ORAL | Status: DC
  Administered 2015-12-28: 50 ug via ORAL
  Filled 2015-12-27: qty 1

## 2015-12-27 MED ORDER — MORPHINE SULFATE (PF) 2 MG/ML IV SOLN: 1.0000 mg | INTRAVENOUS | Status: DC | PRN

## 2015-12-27 MED ORDER — SPIRONOLACTONE 25 MG PO TABS
25.0000 mg | ORAL_TABLET | Freq: Every day | ORAL | Status: DC
  Administered 2015-12-27 – 2015-12-28 (×2): 25 mg via ORAL
  Filled 2015-12-27 (×2): qty 1

## 2015-12-27 MED ORDER — MEPERIDINE HCL 25 MG/ML IJ SOLN: 6.2500 mg | INTRAMUSCULAR | Status: DC | PRN

## 2015-12-27 MED ORDER — ACETAMINOPHEN 500 MG PO TABS
1000.0000 mg | ORAL_TABLET | Freq: Four times a day (QID) | ORAL | Status: DC
  Administered 2015-12-27 – 2015-12-28 (×3): 1000 mg via ORAL
  Filled 2015-12-27 (×3): qty 2

## 2015-12-27 MED ORDER — FENTANYL CITRATE (PF) 250 MCG/5ML IJ SOLN
INTRAMUSCULAR | Status: AC
  Filled 2015-12-27: qty 5

## 2015-12-27 MED ORDER — LACTATED RINGERS IV SOLN: INTRAVENOUS | Status: DC | PRN

## 2015-12-27 MED ORDER — LIDOCAINE 2% (20 MG/ML) 5 ML SYRINGE
INTRAMUSCULAR | Status: AC
  Filled 2015-12-27: qty 5

## 2015-12-27 MED ORDER — FENTANYL CITRATE (PF) 100 MCG/2ML IJ SOLN: 25.0000 ug | INTRAMUSCULAR | Status: DC | PRN

## 2015-12-27 MED ORDER — DOCUSATE SODIUM 100 MG PO CAPS
100.0000 mg | ORAL_CAPSULE | Freq: Two times a day (BID) | ORAL | Status: DC
  Administered 2015-12-27 – 2015-12-28 (×2): 100 mg via ORAL
  Filled 2015-12-27 (×2): qty 1

## 2015-12-27 MED ORDER — SENNOSIDES-DOCUSATE SODIUM 8.6-50 MG PO TABS: 1.0000 | ORAL_TABLET | Freq: Every evening | ORAL | Status: DC | PRN

## 2015-12-27 MED ORDER — SUGAMMADEX SODIUM 200 MG/2ML IV SOLN
INTRAVENOUS | Status: AC
  Filled 2015-12-27: qty 2

## 2015-12-27 MED ORDER — ALBUTEROL SULFATE (2.5 MG/3ML) 0.083% IN NEBU: 3.0000 mL | INHALATION_SOLUTION | Freq: Four times a day (QID) | RESPIRATORY_TRACT | Status: DC | PRN

## 2015-12-27 MED ORDER — AMIODARONE HCL 100 MG PO TABS
100.0000 mg | ORAL_TABLET | Freq: Every day | ORAL | Status: DC
  Administered 2015-12-28: 100 mg via ORAL
  Filled 2015-12-27: qty 1

## 2015-12-27 MED ORDER — SODIUM CHLORIDE 0.9 % IV SOLN
INTRAVENOUS | Status: DC
  Administered 2015-12-27: 17:00:00 via INTRAVENOUS

## 2015-12-27 MED ORDER — PRAVASTATIN SODIUM 40 MG PO TABS
40.0000 mg | ORAL_TABLET | Freq: Every evening | ORAL | Status: DC
  Administered 2015-12-27: 40 mg via ORAL
  Filled 2015-12-27: qty 1

## 2015-12-27 MED ORDER — OXYCODONE HCL 5 MG PO TABS
5.0000 mg | ORAL_TABLET | ORAL | Status: DC | PRN
  Administered 2015-12-28: 10 mg via ORAL
  Filled 2015-12-27: qty 2

## 2015-12-27 MED ORDER — SERTRALINE HCL 50 MG PO TABS
50.0000 mg | ORAL_TABLET | Freq: Every morning | ORAL | Status: DC
  Administered 2015-12-28: 50 mg via ORAL
  Filled 2015-12-27: qty 1

## 2015-12-27 MED ORDER — ROCURONIUM BROMIDE 100 MG/10ML IV SOLN
INTRAVENOUS | Status: DC | PRN
  Administered 2015-12-27: 40 mg via INTRAVENOUS

## 2015-12-27 MED ORDER — DEXTROSE 5 % IV SOLN
2.0000 g | INTRAVENOUS | Status: DC
  Administered 2015-12-27: 2 g via INTRAVENOUS
  Filled 2015-12-27 (×2): qty 2

## 2015-12-27 MED ORDER — SODIUM CHLORIDE 0.9 % IR SOLN
Status: DC | PRN
  Administered 2015-12-27: 3000 mL

## 2015-12-27 MED ORDER — METOCLOPRAMIDE HCL 5 MG/ML IJ SOLN: 5.0000 mg | Freq: Three times a day (TID) | INTRAMUSCULAR | Status: DC | PRN

## 2015-12-27 MED ORDER — PROPOFOL 10 MG/ML IV BOLUS
INTRAVENOUS | Status: AC
  Filled 2015-12-27: qty 20

## 2015-12-27 MED ORDER — ALUMINUM HYDROXIDE GEL 320 MG/5ML PO SUSP
15.0000 mL | ORAL | Status: DC | PRN
Start: 2015-12-27 — End: 2015-12-28
  Filled 2015-12-27: qty 30

## 2015-12-27 MED ORDER — PANTOPRAZOLE SODIUM 40 MG PO TBEC
80.0000 mg | DELAYED_RELEASE_TABLET | Freq: Every day | ORAL | Status: DC
  Administered 2015-12-28: 80 mg via ORAL
  Filled 2015-12-27: qty 2

## 2015-12-27 MED ORDER — ALLOPURINOL 300 MG PO TABS: 300.0000 mg | ORAL_TABLET | Freq: Every evening | ORAL | Status: DC

## 2015-12-27 MED ORDER — ZOLPIDEM TARTRATE 5 MG PO TABS: 5.0000 mg | ORAL_TABLET | Freq: Every evening | ORAL | Status: DC | PRN

## 2015-12-27 MED ORDER — FUROSEMIDE 40 MG PO TABS
40.0000 mg | ORAL_TABLET | Freq: Every evening | ORAL | Status: DC
  Administered 2015-12-27: 40 mg via ORAL
  Filled 2015-12-27: qty 1

## 2015-12-27 MED ORDER — ONDANSETRON HCL 4 MG/2ML IJ SOLN
INTRAMUSCULAR | Status: DC | PRN
  Administered 2015-12-27: 4 mg via INTRAVENOUS

## 2015-12-27 MED ORDER — SODIUM CHLORIDE 0.9 % IV SOLN
1000.0000 mg | INTRAVENOUS | Status: AC
  Administered 2015-12-27: 1000 mg via INTRAVENOUS
  Filled 2015-12-27: qty 10

## 2015-12-27 MED ORDER — SODIUM CHLORIDE 0.9 % IV SOLN
INTRAVENOUS | Status: DC
  Administered 2015-12-27: 11:00:00 via INTRAVENOUS

## 2015-12-27 MED ORDER — ENOXAPARIN SODIUM 60 MG/0.6ML ~~LOC~~ SOLN
60.0000 mg | SUBCUTANEOUS | Status: DC
  Administered 2015-12-28: 60 mg via SUBCUTANEOUS
  Filled 2015-12-27: qty 0.6

## 2015-12-27 MED ORDER — PHENYLEPHRINE HCL 10 MG/ML IJ SOLN
INTRAVENOUS | Status: DC | PRN
  Administered 2015-12-27: 50 ug/min via INTRAVENOUS

## 2015-12-27 MED ORDER — DIPHENHYDRAMINE HCL 12.5 MG/5ML PO ELIX: 12.5000 mg | ORAL_SOLUTION | ORAL | Status: DC | PRN

## 2015-12-27 MED ORDER — 0.9 % SODIUM CHLORIDE (POUR BTL) OPTIME
TOPICAL | Status: DC | PRN
  Administered 2015-12-27: 1000 mL

## 2015-12-27 MED ORDER — ONDANSETRON HCL 4 MG/2ML IJ SOLN
INTRAMUSCULAR | Status: AC
  Filled 2015-12-27: qty 2

## 2015-12-27 MED ORDER — HEMOSTATIC AGENTS (NO CHARGE) OPTIME
TOPICAL | Status: DC | PRN
  Administered 2015-12-27: 1 via TOPICAL

## 2015-12-27 MED ORDER — METOPROLOL TARTRATE 25 MG PO TABS
25.0000 mg | ORAL_TABLET | Freq: Every day | ORAL | Status: DC
  Administered 2015-12-28: 25 mg via ORAL
  Filled 2015-12-27: qty 1

## 2015-12-27 MED ORDER — DEXAMETHASONE SODIUM PHOSPHATE 10 MG/ML IJ SOLN
INTRAMUSCULAR | Status: DC | PRN
  Administered 2015-12-27: 10 mg via INTRAVENOUS

## 2015-12-27 MED ORDER — ONDANSETRON HCL 4 MG/2ML IJ SOLN: 4.0000 mg | Freq: Once | INTRAMUSCULAR | Status: DC | PRN

## 2015-12-27 MED ORDER — POVIDONE-IODINE 7.5 % EX SOLN
Freq: Once | CUTANEOUS | Status: DC
  Filled 2015-12-27: qty 118

## 2015-12-27 MED ORDER — FUROSEMIDE 40 MG PO TABS
80.0000 mg | ORAL_TABLET | ORAL | Status: DC
  Administered 2015-12-28: 80 mg via ORAL
  Filled 2015-12-27: qty 2

## 2015-12-27 MED ORDER — FUROSEMIDE 40 MG PO TABS
40.0000 mg | ORAL_TABLET | Freq: Two times a day (BID) | ORAL | Status: DC
Start: 2015-12-27 — End: 2015-12-27

## 2015-12-27 MED ORDER — BISACODYL 5 MG PO TBEC: 5.0000 mg | DELAYED_RELEASE_TABLET | Freq: Every day | ORAL | Status: DC | PRN

## 2015-12-27 MED ORDER — FENTANYL CITRATE (PF) 100 MCG/2ML IJ SOLN
INTRAMUSCULAR | Status: AC
  Administered 2015-12-27: 50 ug via INTRAVENOUS
  Filled 2015-12-27: qty 2

## 2015-12-27 SURGICAL SUPPLY — 68 items
2.5X220MM GUIDE WIRE ×2 IMPLANT
BIT DRILL 5/64X5 DISP (BIT) IMPLANT
BLADE SAW SAG 73X25 THK (BLADE) ×1
BLADE SAW SGTL 73X25 THK (BLADE) ×1 IMPLANT
BLADE SURG 15 STRL LF DISP TIS (BLADE) ×1 IMPLANT
BLADE SURG 15 STRL SS (BLADE) ×1
CAP SHOULDER TOTAL 2 ×2 IMPLANT
CEMENT BONE DEPUY (Cement) ×2 IMPLANT
CHLORAPREP W/TINT 26ML (MISCELLANEOUS) ×2 IMPLANT
CLSR STERI-STRIP ANTIMIC 1/2X4 (GAUZE/BANDAGES/DRESSINGS) ×2 IMPLANT
CONT SPEC 4OZ CLIKSEAL STRL BL (MISCELLANEOUS) ×2 IMPLANT
COVER SURGICAL LIGHT HANDLE (MISCELLANEOUS) ×2 IMPLANT
DRAPE INCISE IOBAN 66X45 STRL (DRAPES) ×2 IMPLANT
DRAPE ORTHO SPLIT 77X108 STRL (DRAPES) ×2
DRAPE SURG 17X23 STRL (DRAPES) ×2 IMPLANT
DRAPE SURG ORHT 6 SPLT 77X108 (DRAPES) ×2 IMPLANT
DRAPE U-SHAPE 47X51 STRL (DRAPES) ×2 IMPLANT
DRSG AQUACEL AG ADV 3.5X10 (GAUZE/BANDAGES/DRESSINGS) ×2 IMPLANT
ELECT BLADE 4.0 EZ CLEAN MEGAD (MISCELLANEOUS)
ELECT REM PT RETURN 9FT ADLT (ELECTROSURGICAL) ×2
ELECTRODE BLDE 4.0 EZ CLN MEGD (MISCELLANEOUS) IMPLANT
ELECTRODE REM PT RTRN 9FT ADLT (ELECTROSURGICAL) ×1 IMPLANT
EVACUATOR 1/8 PVC DRAIN (DRAIN) IMPLANT
GLOVE BIO SURGEON STRL SZ7 (GLOVE) ×2 IMPLANT
GLOVE BIO SURGEON STRL SZ7.5 (GLOVE) ×2 IMPLANT
GLOVE BIOGEL PI IND STRL 7.0 (GLOVE) ×1 IMPLANT
GLOVE BIOGEL PI IND STRL 8 (GLOVE) ×1 IMPLANT
GLOVE BIOGEL PI INDICATOR 7.0 (GLOVE) ×1
GLOVE BIOGEL PI INDICATOR 8 (GLOVE) ×1
GOWN STRL REUS W/ TWL LRG LVL3 (GOWN DISPOSABLE) ×1 IMPLANT
GOWN STRL REUS W/ TWL XL LVL3 (GOWN DISPOSABLE) ×1 IMPLANT
GOWN STRL REUS W/TWL LRG LVL3 (GOWN DISPOSABLE) ×1
GOWN STRL REUS W/TWL XL LVL3 (GOWN DISPOSABLE) ×1
HANDPIECE INTERPULSE COAX TIP (DISPOSABLE) ×1
HEMOSTAT SURGICEL 2X14 (HEMOSTASIS) ×2 IMPLANT
HOOD PEEL AWAY FLYTE STAYCOOL (MISCELLANEOUS) ×4 IMPLANT
KIT BASIN OR (CUSTOM PROCEDURE TRAY) ×2 IMPLANT
KIT ROOM TURNOVER OR (KITS) ×2 IMPLANT
MANIFOLD NEPTUNE II (INSTRUMENTS) ×2 IMPLANT
NEEDLE HYPO 25GX1X1/2 BEV (NEEDLE) IMPLANT
NEEDLE MAYO TROCAR (NEEDLE) ×2 IMPLANT
NS IRRIG 1000ML POUR BTL (IV SOLUTION) ×2 IMPLANT
PACK SHOULDER (CUSTOM PROCEDURE TRAY) ×2 IMPLANT
PAD ARMBOARD 7.5X6 YLW CONV (MISCELLANEOUS) ×4 IMPLANT
RESTRAINT HEAD UNIVERSAL NS (MISCELLANEOUS) ×2 IMPLANT
RETRIEVER SUT HEWSON (MISCELLANEOUS) ×2 IMPLANT
SET HNDPC FAN SPRY TIP SCT (DISPOSABLE) ×1 IMPLANT
SLING ARM IMMOBILIZER LRG (SOFTGOODS) ×2 IMPLANT
SLING ARM IMMOBILIZER MED (SOFTGOODS) ×2 IMPLANT
SMARTMIX MINI TOWER (MISCELLANEOUS) ×2
SPONGE LAP 18X18 X RAY DECT (DISPOSABLE) ×2 IMPLANT
SPONGE LAP 4X18 X RAY DECT (DISPOSABLE) IMPLANT
STRIP CLOSURE SKIN 1/2X4 (GAUZE/BANDAGES/DRESSINGS) ×2 IMPLANT
SUCTION FRAZIER HANDLE 10FR (MISCELLANEOUS) ×1
SUCTION TUBE FRAZIER 10FR DISP (MISCELLANEOUS) ×1 IMPLANT
SUPPORT WRAP ARM LG (MISCELLANEOUS) ×2 IMPLANT
SUT ETHIBOND NAB CT1 #1 30IN (SUTURE) ×6 IMPLANT
SUT MNCRL AB 4-0 PS2 18 (SUTURE) ×2 IMPLANT
SUT SILK 2 0 TIES 17X18 (SUTURE)
SUT SILK 2-0 18XBRD TIE BLK (SUTURE) IMPLANT
SUT VIC AB 2-0 CT1 27 (SUTURE) ×2
SUT VIC AB 2-0 CT1 TAPERPNT 27 (SUTURE) ×2 IMPLANT
SYR CONTROL 10ML LL (SYRINGE) IMPLANT
TAPE LABRALWHITE 1.5X36 (TAPE) ×4 IMPLANT
TAPE SUT LABRALTAP WHT/BLK (SUTURE) ×2 IMPLANT
TOWEL OR 17X24 6PK STRL BLUE (TOWEL DISPOSABLE) ×2 IMPLANT
TOWEL OR 17X26 10 PK STRL BLUE (TOWEL DISPOSABLE) ×2 IMPLANT
TOWER SMARTMIX MINI (MISCELLANEOUS) ×1 IMPLANT

## 2015-12-27 NOTE — Transfer of Care (Signed)
Immediate Anesthesia Transfer of Care Note  Patient: [name]  Procedure(s) Performed: Procedure(s) with comments: RIGHT TOTAL SHOULDER ARTHROPLASTY (Right) - Right total shoulder arthroplasty  Patient Location: PACU  Anesthesia Type:GA combined with regional for post-op pain  Level of Consciousness: awake, sedated and patient cooperative  Airway & Oxygen Therapy: Patient Spontanous Breathing and Patient connected to face mask oxygen  Post-op Assessment: Report given to RN and Post -op Vital signs reviewed and stable  Post vital signs: Reviewed and stable  Last Vitals:  Vitals:   12/27/15 1207 12/27/15 1535  BP: (!) 142/54   Pulse: 65   Resp: 20   Temp:  36.2 C    Last Pain:  Vitals:   12/27/15 1114  TempSrc:   PainSc: 4          Complications: No apparent anesthesia complications

## 2015-12-27 NOTE — Op Note (Signed)
Procedure(s): RIGHT TOTAL SHOULDER ARTHROPLASTY Procedure Note  [name] female 75 y.o. 12/27/2015  Procedure(s) and Anesthesia Type:    * RIGHT TOTAL SHOULDER ARTHROPLASTY  Surgeon(s) and Role:    * Tania Ade, MD - Primary   Indications:  75 y.o. female  With endstage right shoulder arthritis. Pain and dysfunction interfered with quality of life and nonoperative treatment with activity modification, NSAIDS and injections failed.     Surgeon: Nita Sells   Assistants: Jeanmarie Hubert PA-C [facility] was present and scrubbed throughout the procedure and was essential in positioning, retraction, exposure, and closure)  Anesthesia: General endotracheal anesthesia with preoperative interscalene block given by the attending anesthesiologist    Procedure Detail  RIGHT TOTAL SHOULDER ARTHROPLASTY  Findings: Tornier flex anatomic press-fit size 2 stem with a 43 high offset head, cemented size small 40 Cortiloc glenoid.   A lesser tuberosity osteotomy was performed and repaired at the conclusion of the procedure. Bone quality of tissue quality was noted to be very poor. Tissues appeared consistent with chronic inflammation and the joint fluid was bloody. I did send cultures although my suspicion for infection is very low.  Estimated Blood Loss:  200 mL         Drains: None   Blood Given: none          Specimens: none        Complications:  * No complications entered in OR log *         Disposition: PACU - hemodynamically stable.         Condition: stable    Procedure:   The patient was identified in the preoperative holding area where I personally marked the operative extremity after verifying with the patient and consent. She  was taken to the operating room where She was transferred to the   operative table.  The patient received an interscalene block in   the holding area by the attending anesthesiologist.  General anesthesia was induced   in  the operating room without complication.  The patient did receive IV  Ancef prior to the commencement of the procedure.  The patient was   placed in the beach-chair position with the back raised about 30   degrees.  The nonoperative extremity and head and neck were carefully   positioned and padded protecting against neurovascular compromise.  The   left upper extremity was then prepped and draped in the standard sterile   fashion.    The appropriate operative time-out was performed with   Anesthesia, the perioperative staff, as well as myself and we all agreed   that the right side was the correct operative site.  An approximately   10 cm incision was made from the tip of the coracoid to the center point of the   humerus at the level of the axilla.  Dissection was carried down sharply   through subcutaneous tissues and cephalic vein was identified and taken   laterally with the deltoid.  The pectoralis major was taken medially.  The   upper 1 cm of the pectoralis major was released from its attachment on   the humerus.  The clavipectoral fascia was incised just lateral to the   conjoined tendon.  This incision was carried up to but not into the   coracoacromial ligament.  Digital palpation was used to prove   integrity of the axillary nerve which was protected throughout the   procedure.  Musculocutaneous nerve was not palpated in the operative  field.  Conjoined tendon was then retracted gently medially and the   deltoid laterally.  Anterior circumflex humeral vessels were clamped and   coagulated.  The soft tissues overlying the biceps was incised and this   incision was carried across the transverse humeral ligament to the base   of the coracoid.  The biceps was tenodesed to the soft tissue just above   pectoralis major and the remaining portion of the biceps superiorly was   excised.  An osteotomy was performed at the lesser tuberosity, bone quality was noted to be very poor..   Capsule was then   released all the way down to the 6 o'clock position of the humeral head.   The humeral head was then delivered with simultaneous adduction,   extension and external rotation.  All humeral osteophytes were removed   and the anatomic neck of the humerus was marked and cut free hand at   approximately 25 degrees retroversion within about 3 mm of the cuff   reflection posteriorly.  The head size was estimated to be a 43 medium   offset.  At that point, the humeral head was retracted posteriorly with   a Fukuda retractor.  The remaining biceps anchor and the entire anterior-inferior   labrum was excised.  The posterior labrum was also excised but the   posterior capsule was not released.  All the tissues in the shoulder appeared consistent with chronic inflammation and were somewhat friable and thin. The joint fluid was bloody. No purulence was identified. I did send a few tissue samples and cultures to ensure there is no infection. The guidepin was placed bicortically with non elevated guide.  The reamer was used to ream to concentric bone with punctate bleeding.  This gave an excellent concentric surface.  The center hole was then drilled for an anchor peg glenoid followed by the three peripheral holes and none of the holes   exited the glenoid wall.  I then pulse irrigated these holes and dried   them with Surgicel.  The three peripheral holes were then   pressurized cemented and the anchor peg glenoid was placed and impacted   with an excellent fit.  The glenoid was a 40 small component.  The proximal humerus was then again exposed taking care not to displace the glenoid.    The entry awl was used followed by sounding reamers and then sequentially broached from size 1 to 2. Again bone quality was noted to be poor. Trial head was placed with a 43 high offset.  With the trial implantation of the component,  there was approximately 50% posterior translation with immediate snap back to  the   anatomic position.  With forward elevation, there was no tendency   towards posterior subluxation.   The trial was removed and the final implant was prepared on a back table.  The trial was removed and the final implant was prepared on a back table.    The implant was then placed through the loop of 2 labral tapes and impacted with an acceptable press-fit. This achieved excellent anatomic reconstruction of the proximal humerus.  The joint was then copiously irrigated with pulse lavage.  The subscapularis and   lesser tuberosity osteotomy were then repaired using the 2 labral tapes previously passed in a double row fashion with horizontal mattress sutures medially brought over through bone tunnels tied over a bone bridge laterally.   Copious irrigation was used. Skin was closed with 2-0 Vicryl sutures  in the deep dermal layer and 4-0 Monocryl in a subcuticular  running fashion.  Sterile dressings were then applied including Aquacel.  The patient was placed in a sling and allowed to awaken from general anesthesia and taken to the recovery room in stable condition.      POSTOPERATIVE PLAN: Given her poor bone and tissue quality we will progressed slowly in her recovery and keep her in a sling for 6 weeks to allow healing of the subscapularis and bony ingrowth prior to progressing with range of motion. The patient will likely be kept in the hospital for 1-2 days and then discharged home.

## 2015-12-27 NOTE — Anesthesia Preprocedure Evaluation (Addendum)
Anesthesia Evaluation  Patient identified by MRN, date of birth, ID band  Reviewed: Allergy & Precautions, NPO status , Patient's Chart, lab work & pertinent test results  History of Anesthesia Complications (+) history of anesthetic complications (hx of requiring reintubation in pacu perhaps from being overly sedated, old record reviewed)  Airway Mallampati: II  TM Distance: >3 FB Neck ROM: Full    Dental no notable dental hx. (+) Dental Advisory Given, Teeth Intact, Missing   Pulmonary asthma , COPD (well controlled, mild per patient, no home oxygen, good exercise tolerance),  COPD inhaler, former smoker,    Pulmonary exam normal breath sounds clear to auscultation       Cardiovascular +CHF  Normal cardiovascular exam+ dysrhythmias + pacemaker + Cardiac Defibrillator + Valvular Problems/Murmurs  Rhythm:Regular Rate:Normal     Neuro/Psych PSYCHIATRIC DISORDERS Anxiety    GI/Hepatic Neg liver ROS, GERD  Medicated and Controlled,  Endo/Other  Hypothyroidism   Renal/GU negative Renal ROS     Musculoskeletal  (+) Arthritis , Fibromyalgia -  Abdominal   Peds  Hematology   Anesthesia Other Findings   Reproductive/Obstetrics                            Anesthesia Physical Anesthesia Plan  ASA: III  Anesthesia Plan: General   Post-op Pain Management: GA combined w/ Regional for post-op pain   Induction: Intravenous  Airway Management Planned: Oral ETT  Additional Equipment:   Intra-op Plan:   Post-operative Plan: Extubation in OR  Informed Consent: I have reviewed the patients History and Physical, chart, labs and discussed the procedure including the risks, benefits and alternatives for the proposed anesthesia with the patient or authorized representative who has indicated his/her understanding and acceptance.   Dental advisory given  Plan Discussed with: CRNA  Anesthesia Plan  Comments:         Anesthesia Quick Evaluation

## 2015-12-27 NOTE — Discharge Instructions (Signed)
Discharge Instructions after Total Shoulder Arthroplasty   A sling has been provided for you. You are to where this at all times, even while sleeping, until your first post operative visit with Dr. Tamera Punt. Use ice on the shoulder intermittently over the first 48 hours after surgery.  Pain medicine has been prescribed for you.  Use your medicine liberally over the first 48 hours, and then you can begin to taper your use. You may take Extra Strength Tylenol or Tylenol only in place of the pain pills. DO NOT take ANY nonsteroidal anti-inflammatory pain medications: Advil, Motrin, Ibuprofen, Aleve, Naproxen or Naprosyn.  Leave your dressing on until your first follow up visit.  You may shower with the dressing.  Hold your arm as if you still have your sling on while you shower. Simply allow the water to wash over the site and then pat dry. Make sure your axilla (armpit) is completely dry after showering.    Please call [phone] during normal business hours or [phone] after hours for any problems. Including the following:  - excessive redness of the incisions - drainage for more than 4 days - fever of more than 101.5 F  *Please note that pain medications will not be refilled after hours or on weekends.

## 2015-12-27 NOTE — H&P (Signed)
[name] is an 75 y.o. female.   Chief Complaint: Right shoulder pain and dysfunction HPI: 75 year old female with endstage right shoulder osteoarthritis, failed conservative management. Pain and dysfunction interfered with quality of life and sleep.  Past Medical History:  Diagnosis Date  . Anemia   . Anxiety   . Arthritis   . Asthma   . 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 Protecta  . Biventricular ICD (implantable cardioverter-defibrillator) in place    with CRT  . Blood transfusion without reported diagnosis   . Bursitis   . Cataract    RIGHT EYE  . CHF (congestive heart failure) ([place])   . Chronic kidney disease   . Colon polyp    adenomatous  . Complication of anesthesia    patient stated that had difficulty getting the breathing tube removed, patient said that she stopped breathing and HR dropped to 10  patient then woke up and started breathing pateint stated no longer than one minute; re-intubated in PACU following cholecystectomy 10/28/13  . COPD (chronic obstructive pulmonary disease) ([place])   . Diverticulosis   . Dysrhythmia   . Fibromyalgia   . GERD (gastroesophageal reflux disease)   . Gout   . H/O mitral valve replacement 2002, 2007  . Heart murmur   . Hemorrhoids   . Hyperlipidemia   . Hypothyroidism   . IBS (irritable bowel syndrome)   . MVP (mitral valve prolapse)   . Pacemaker   . Peptic ulcer disease     Past Surgical History:  Procedure Laterality Date  . ABDOMINAL HYSTERECTOMY    . CARDIAC CATHETERIZATION  02/25/2006   normal left main, normal LAD, normal L Cfx, normal/dominant RCA (Dr. [name])  . CARDIAC DEFIBRILLATOR PLACEMENT  2007, 11/202012   x2 (pacemaker) (Dr. Jerilynn Mages. [place])  . CARDIAC VALVE REPLACEMENT  2002   MV repair - Dr. Berle Mull  . CHOLECYSTECTOMY N/A 10/28/2013   Procedure: LAPAROSCOPIC CHOLECYSTECTOMY WITH INTRAOPERATIVE CHOLANGIOGRAM;  Surgeon: Edward Jolly, MD;  Location: [facility];   Service: General;  Laterality: N/A;  . COLONOSCOPY    . EYE SURGERY    . IMPLANTABLE CARDIOVERTER DEFIBRILLATOR (ICD) GENERATOR CHANGE N/A 04/10/2011   Procedure: ICD GENERATOR CHANGE;  Surgeon: Sanda Klein, MD;  Location: [facility] CATH [facility];  Service: Cardiovascular;  Laterality: N/A;  . KNEE ARTHROSCOPY    . MITRAL VALVE REPLACEMENT  02/26/2006   re-do MVR w/21m St. [name] (Dr. BEllison Hughs  . NM MYOCAR PERF WALL MOTION  2005   persantine myoview - low ris, EF 63%  . RIGHT HEART CATH  04/03/2006   pulm cap wedge pressure 24/24, PA pressure 43/22 (mean 363mg), CO 4.8, CI 4.1 (Dr. J.Jackie Plum . TRANSTHORACIC ECHOCARDIOGRAM  12/2011   EF 50-55%, mild global hypokinesis; LA severely dilated; calcification of anterior/posterior MV leaflets, bi-leaflets St. [name] mechanical MV; mild TR; trace AV regurg/pulm valve regurg    Family History  Problem Relation Age of Onset  . Breast cancer Sister     multiple  . Uterine cancer Sister     multiple  . Heart disease Mother   . Heart attack Mother   . Heart disease Father   . Kidney disease Father   . Heart attack Father   . Heart disease Sister   . Crohn's disease Sister   . Heart disease Son    Social History:  reports that she quit smoking about 14 years ago. Her smoking use included Cigarettes. She has never  used smokeless tobacco. She reports that she drinks alcohol. She reports that she does not use drugs.  Allergies:  Allergies  Allergen Reactions  . Ace Inhibitors Cough  . Lipitor [Atorvastatin Calcium] Other (See Comments)    Knee pain.  . Codeine Nausea And Vomiting    Medications Prior to Admission  Medication Sig Dispense Refill  . acetaminophen (TYLENOL) 325 MG tablet Take 650 mg by mouth every 6 (six) hours as needed for mild pain.    [name] allopurinol (ZYLOPRIM) 300 MG tablet Take 300 mg by mouth every evening.     [name] amiodarone (PACERONE) 200 MG tablet TAKE 1/2 TABLET BY MOUTH DAILY 30 tablet 1  . enoxaparin (LOVENOX) 60 MG/0.6ML  injection Inject 0.6 mLs (60 mg total) into the skin daily. 10 Syringe 1  . furosemide (LASIX) 40 MG tablet TAKE 2 TABLETS BY MOUTH EVERY MORNING AND 1 TAB EVERY AFTERNOON 90 tablet 4  . gabapentin (NEURONTIN) 300 MG capsule Take 300 mg by mouth 2 (two) times daily.     [name] levothyroxine (SYNTHROID, LEVOTHROID) 50 MCG tablet TAKE 1 TABLET (50 MCG TOTAL) BY MOUTH DAILY BEFORE BREAKFAST. 30 tablet 6  . metoprolol tartrate (LOPRESSOR) 25 MG tablet Take 25 mg by mouth daily.     . Multiple Vitamins-Minerals (MULTIVITAMINS THER. W/MINERALS) TABS Take 1 tablet by mouth every morning.     [name] omeprazole (PRILOSEC) 40 MG capsule TAKE ONE CAPSULE BY MOUTH EVERY DAY 30 capsule 8  . pravastatin (PRAVACHOL) 40 MG tablet Take 40 mg by mouth daily.    [name] PROAIR HFA 108 (90 BASE) MCG/ACT inhaler Inhale 1-2 puffs into the lungs every 6 (six) hours as needed for wheezing.     . sertraline (ZOLOFT) 25 MG tablet Take 50 mg by mouth every morning.     [name] spironolactone (ALDACTONE) 25 MG tablet TAKE 1 TABLET BY MOUTH EVERY DAY 30 tablet 6  . traMADol (ULTRAM) 50 MG tablet Take 50 mg by mouth every 6 (six) hours as needed for moderate pain.     [name] warfarin (COUMADIN) 2.5 MG tablet Take 1 to 1.5 tablets by mouth daily as directed by coumadin clinic (Patient taking differently: Take 2.5-3.75 mg by mouth See admin instructions. Pt takes 3.62m MWF - pt takes 2.571mall other days) 135 tablet 1  . amoxicillin (AMOXIL) 500 MG capsule TAKE 4 CAPSULES 1 HOUR PRIOR TO DENTAL PROCEDURES 4 capsule 1    Results for orders placed or performed during the hospital encounter of 12/27/15 (from the past 48 hour(s))  APTT     Status: Abnormal   Collection Time: 12/27/15 11:19 AM  Result Value Ref Range   aPTT 37 (H) 24 - 36 seconds    Comment:        IF BASELINE aPTT IS ELEVATED, SUGGEST PATIENT RISK ASSESSMENT BE USED TO DETERMINE APPROPRIATE ANTICOAGULANT THERAPY.   Protime-INR     Status: Abnormal   Collection Time: 12/27/15 11:19  AM  Result Value Ref Range   Prothrombin Time 19.5 (H) 11.4 - 15.2 seconds   INR 1.63    No results found.  Review of Systems  All other systems reviewed and are negative.   Blood pressure (!) 142/54, pulse 65, temperature 98.8 F (37.1 C), temperature source Oral, resp. rate 20, height 5' 2"  (1.575 m), weight 54.1 kg (119 lb 3.2 oz), SpO2 98 %. Physical Exam  Constitutional: She is oriented to person, place, and time. She appears well-developed and well-nourished.  HENT:  Head:  Atraumatic.  Eyes: EOM are normal.  Cardiovascular: Intact distal pulses.   Respiratory: Effort normal.  Musculoskeletal:  R shoulder pain with limited motion  Neurological: She is alert and oriented to person, place, and time.  Skin: Skin is warm and dry.  Psychiatric: She has a normal mood and affect.     Assessment/Plan Right shoulder endstage osteoarthritis Plan right total shoulder replacement Risks / benefits of surgery discussed Consent on chart  NPO for OR Preop antibiotics    Nita Sells, MD 12/27/2015, 12:22 PM

## 2015-12-27 NOTE — Anesthesia Procedure Notes (Addendum)
Procedure Name: Intubation Date/Time: 12/27/2015 1:02 PM Performed by: Rush Farmer E Pre-anesthesia Checklist: Patient identified, Emergency Drugs available, Suction available, Patient being monitored and Timeout performed Patient Re-evaluated:Patient Re-evaluated prior to inductionOxygen Delivery Method: Circle system utilized Preoxygenation: Pre-oxygenation with 100% oxygen Intubation Type: IV induction Ventilation: Mask ventilation without difficulty Laryngoscope Size: Mac and 3 Grade View: Grade II Tube type: Oral Tube size: 7.0 mm Number of attempts: 1 Airway Equipment and Method: Stylet Placement Confirmation: ETT inserted through vocal cords under direct vision,  positive ETCO2 and breath sounds checked- equal and bilateral Secured at: 20 cm Tube secured with: Tape Dental Injury: Teeth and Oropharynx as per pre-operative assessment

## 2015-12-27 NOTE — Anesthesia Procedure Notes (Signed)
Anesthesia Regional Block:  Interscalene brachial plexus block  Pre-Anesthetic Checklist: ,, timeout performed, Correct Patient, Correct Site, Correct Laterality, Correct Procedure, Correct Position, site marked, Risks and benefits discussed,  Surgical consent,  Pre-op evaluation,  At surgeon's request and post-op pain management  Laterality: Right  Prep: Maximum Sterile Barrier Precautions used, chloraprep       Needles:  Injection technique: Single-shot  Needle Type: Echogenic Stimulator Needle     Needle Length: 10cm 10 cm Needle Gauge: 21 G    Additional Needles:  Procedures: ultrasound guided (picture in chart) and nerve stimulator Interscalene brachial plexus block Narrative:  Injection made incrementally with aspirations every 5 mL.  Performed by: Personally  Anesthesiologist: Germain Koopmann, [facility]  Additional Notes: Patient tolerated the procedure well without complications

## 2015-12-28 ENCOUNTER — Encounter (HOSPITAL_COMMUNITY): Payer: Self-pay | Admitting: Orthopedic Surgery

## 2015-12-28 LAB — BASIC METABOLIC PANEL
Anion gap: 11 (ref 5–15)
BUN: 14 mg/dL (ref 6–20)
CALCIUM: 8.7 mg/dL — AB (ref 8.9–10.3)
CO2: 24 mmol/L (ref 22–32)
CREATININE: 1.43 mg/dL — AB (ref 0.44–1.00)
Chloride: 97 mmol/L — ABNORMAL LOW (ref 101–111)
GFR calc Af Amer: 41 mL/min — ABNORMAL LOW (ref >60)
GFR calc non Af Amer: 35 mL/min — ABNORMAL LOW (ref >60)
GLUCOSE: 111 mg/dL — AB (ref 65–99)
Potassium: 4.3 mmol/L (ref 3.5–5.1)
SODIUM: 132 mmol/L — AB (ref 135–145)

## 2015-12-28 LAB — CBC
HCT: 30.2 % — ABNORMAL LOW (ref 36.0–46.0)
HEMOGLOBIN: 9.9 g/dL — AB (ref 12.0–15.0)
MCH: 30.2 pg (ref 26.0–34.0)
MCHC: 32.8 g/dL (ref 30.0–36.0)
MCV: 92.1 fL (ref 78.0–100.0)
PLATELETS: 249 10*3/uL (ref 150–400)
RBC: 3.28 MIL/uL — AB (ref 3.87–5.11)
RDW: 15 % (ref 11.5–15.5)
WBC: 13.6 10*3/uL — AB (ref 4.0–10.5)

## 2015-12-28 MED ORDER — OXYCODONE-ACETAMINOPHEN 5-325 MG PO TABS: 1.0000 | ORAL_TABLET | ORAL | 0 refills | Status: DC | PRN

## 2015-12-28 MED ORDER — HYDROCODONE-ACETAMINOPHEN 5-325 MG PO TABS: 1.0000 | ORAL_TABLET | ORAL | 0 refills | Status: DC | PRN

## 2015-12-28 MED ORDER — CIPROFLOXACIN HCL 500 MG PO TABS: 500.0000 mg | ORAL_TABLET | Freq: Two times a day (BID) | ORAL | 0 refills | Status: AC

## 2015-12-28 MED ORDER — DOCUSATE SODIUM 100 MG PO CAPS: 100.0000 mg | ORAL_CAPSULE | Freq: Three times a day (TID) | ORAL | 0 refills | Status: DC | PRN

## 2015-12-28 NOTE — Care Management Important Message (Signed)
Important Message  Patient Details  Name: [name] MRN: [id number] Date of Birth: 09/17/1940   Medicare Important Message Given:  Yes    [name] 12/28/2015, 10:42 AM

## 2015-12-28 NOTE — Progress Notes (Signed)
Pt ready for discharge. Education/instructions reviewed with pt and husband and all questions/concerns addressed. IV removed and belongings gathered. Pt will be transported out via wheelchair to husband's car. Will continue to monitor

## 2015-12-28 NOTE — Care Management (Signed)
Patient has no home Health needs. S/p right total shoulder arthroplasty.

## 2015-12-28 NOTE — Progress Notes (Signed)
   PATIENT ID: [name]   1 Day Post-Op Procedure(s) (LRB): RIGHT TOTAL SHOULDER ARTHROPLASTY (Right)  Subjective: Doing well this morning, minimal pain. Denies dizziness, lightheadedness.   Objective:  Vitals:   12/28/15 0011 12/28/15 0543  BP: (!) 131/58 (!) 132/55  Pulse: 60 60  Resp: 16 16  Temp: 98 F (36.7 C) 98 F (36.7 C)     R UE dressing c/d/i No surrounding erythema, warmth Wiggles fingers, distally NVI  Labs:   Recent Labs  12/28/15 0429  HGB 9.9*   Recent Labs  12/28/15 0429  WBC 13.6*  RBC 3.28*  HCT 30.2*  PLT 249   Recent Labs  12/28/15 0429  NA 132*  K 4.3  CL 97*  CO2 24  BUN 14  CREATININE 1.43*  GLUCOSE 111*  CALCIUM 8.7*    Assessment and Plan: 1 day s/p TSA with + gram stain IV rocephin per ID, cirpo 500 mg BID x 14 days, will follow outpatient and wait for cultures to speciate OT- hand wrist elbow ROM only, NO SHOULDER ROM  Sling except for with OT ABLA- asymptomatic D/c home today, norco for pain control, script in chart. Lovenox bridge per cardiologist Fu with Dr. Tamera Punt in 2 weeks  VTE proph: lovenox, SCDs

## 2015-12-28 NOTE — Progress Notes (Signed)
   12/28/15 1000  OT Visit Information  Last OT Received On 12/28/15  Assistance Needed +1  History of Present Illness s/p R TSA, h/o ICD, anxiety, COPD  Precautions  Precautions Shoulder  Type of Shoulder Precautions conservative protocol; sling at all times except for bathing/dressing; elbow to finger AROM allowed.  No shoulder movement  Shoulder Interventions Shoulder sling/immobilizer;Off for dressing/bathing/exercises  Precaution Booklet Issued Yes (comment)  Pain Assessment  Pain Assessment 0-10  Pain Score 6  Pain Location R shoulder  Pain Descriptors / Indicators Aching  Pain Intervention(s) Limited activity within patient's tolerance;Monitored during session;Repositioned;Patient requesting pain meds-RN notified;RN gave pain meds during session  Cognition  Arousal/Alertness Awake/alert  Behavior During Therapy [facility] for tasks assessed/performed  Overall Cognitive Status Within Functional Limits for tasks assessed  ADL  General ADL Comments husband present and observed shirt and sling.  He verbalizes understanding and did not feel he needed to practice this.  Showed him how to adjust straps for further support and cue pt not to help. She has a large t neck tshirt she wanted to wear underneath her shirt.  Educated him on taking off.  Discussed alternatives of using bra without R strap, cami donned over feet and pulled up without R strap also.  Husband reviewed protocol and did not have any questions.  Demonstrated washing under arm also.    OT Goal Progression  Progress towards OT goals Goals met/education completed, patient discharged from OT  OT Time Calculation  OT Start Time (ACUTE ONLY) 1002  OT Stop Time (ACUTE ONLY) 1017  OT Time Calculation (min) 15 min  OT General Charges  $OT Visit 1 Procedure  OT Treatments  $Self Care/Home Management  8-22 mins  Lesle Chris, OTR/L [phone] 12/28/2015

## 2015-12-28 NOTE — Evaluation (Signed)
Occupational Therapy Evaluation Patient Details Name: [name] MRN: [id number] DOB: 01-05-1941 Today's Date: 12/28/2015    History of Present Illness s/p R TSA, h/o ICD, anxiety, COPD   Clinical Impression   This 75 year old female was admitted for the above sx. Will see her one more time when husband arrives to educate him on protocol and assisting pt with adls without moving her shoulder    Follow Up Recommendations  Supervision/Assistance - 24 hour    Equipment Recommendations  None recommended by OT    Recommendations for Other Services       Precautions / Restrictions Precautions Precautions: Shoulder Type of Shoulder Precautions: conservative protocol; sling at all times except for bathing/dressing; elbow to finger AROM allowed.  No shoulder movement Shoulder Interventions: Shoulder sling/immobilizer;Off for dressing/bathing/exercises Precaution Booklet Issued: Yes (comment) Required Braces or Orthoses: Sling Restrictions Weight Bearing Restrictions: Yes RUE Weight Bearing: Non weight bearing      Mobility Bed Mobility Overal bed mobility: Needs Assistance Bed Mobility: Supine to Sit     Supine to sit: Min assist     General bed mobility comments: assist for trunk  Transfers Overall transfer level: Modified independent                    Balance                                            ADL Overall ADL's : Needs assistance/impaired     Grooming: Set up;Sitting   Upper Body Bathing: Minimal assitance;Sitting   Lower Body Bathing: Moderate assistance;Sit to/from stand   Upper Body Dressing : Moderate assistance;Sitting   Lower Body Dressing: Maximal assistance;Sit to/from stand   Toilet Transfer: Modified Independent;Stand-pivot (to chair)             General ADL Comments: reviewed protocol with pt and she verbalizes understanding.  Will see her one more time when her husband arrives as he will assist at  home.  See education section     Vision     Perception     Praxis      Pertinent Vitals/Pain Pain Assessment: Faces Faces Pain Scale: Hurts a little bit Pain Location: R shoulder Pain Descriptors / Indicators: Sore Pain Intervention(s): Limited activity within patient's tolerance;Monitored during session;Repositioned;Ice applied     Hand Dominance Right   Extremity/Trunk Assessment Upper Extremity Assessment Upper Extremity Assessment: RUE deficits/detail (immoblized)           Communication Communication Communication: No difficulties   Cognition Arousal/Alertness: Awake/alert Behavior During Therapy: WFL for tasks assessed/performed Overall Cognitive Status: Within Functional Limits for tasks assessed                     General Comments       Exercises       Shoulder Instructions      Home Living Family/patient expects to be discharged to:: Private residence Living Arrangements: Spouse/significant other                 Bathroom Shower/Tub: Occupational psychologist: Handicapped height                Prior Functioning/Environment Level of Independence: Independent             OT Diagnosis: Acute pain   OT Problem List: Pain;Decreased knowledge of  precautions   OT Treatment/Interventions: Self-care/ADL training;DME and/or AE instruction;Patient/family education    OT Goals(Current goals can be found in the care plan section) Acute Rehab OT Goals OT Goal Formulation: With patient Time For Goal Achievement: 01/04/16 Potential to Achieve Goals: Good ADL Goals Additional ADL Goal #1: caregiver will don sling with supervision and verbalize understanding of protocol  OT Frequency: Min 1X/week   Barriers to D/C:            Co-evaluation              End of Session    Activity Tolerance: Patient tolerated treatment well Patient left: in chair;with call bell/phone within reach   Time: 0841-0902 OT Time  Calculation (min): 21 min Charges:  OT General Charges $OT Visit: 1 Procedure OT Evaluation $OT Eval Low Complexity: 1 Procedure G-Codes:    Auda Finfrock 2016/01/01, 9:27 AM Lesle Chris, OTR/L [phone] Jan 01, 2016

## 2015-12-28 NOTE — Anesthesia Postprocedure Evaluation (Signed)
Anesthesia Post Note  Patient: [name]  Procedure(s) Performed: Procedure(s) (LRB): RIGHT TOTAL SHOULDER ARTHROPLASTY (Right)  Patient location during evaluation: PACU Anesthesia Type: General Level of consciousness: sedated Pain management: satisfactory to patient Vital Signs Assessment: post-procedure vital signs reviewed and stable Respiratory status: spontaneous breathing Cardiovascular status: stable Anesthetic complications: no     Last Vitals:  Vitals:   12/28/15 0011 12/28/15 0543  BP: (!) 131/58 (!) 132/55  Pulse: 60 60  Resp: 16 16  Temp: 36.7 C 36.7 C    Last Pain:  Vitals:   12/28/15 1015  TempSrc:   PainSc: 6    Pain Goal: Patients Stated Pain Goal: 2 (12/28/15 1015)               Lyndle Herrlich EDWARD

## 2015-12-28 NOTE — Discharge Summary (Signed)
Patient ID: [name] MRN: [id number] DOB/AGE: Jul 07, 1940 75 y.o.  Admit date: 12/27/2015 Discharge date: 12/28/2015  Admission Diagnoses:  Active Problems:   S/P shoulder replacement   Discharge Diagnoses:  Same  Past Medical History:  Diagnosis Date  . Anemia   . Anxiety   . Arthritis   . Asthma   . 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 Protecta  . Biventricular ICD (implantable cardioverter-defibrillator) in place    with CRT  . Blood transfusion without reported diagnosis   . Bursitis   . Cataract    RIGHT EYE  . CHF (congestive heart failure) ([place])   . Chronic kidney disease   . Colon polyp    adenomatous  . Complication of anesthesia    patient stated that had difficulty getting the breathing tube removed, patient said that she stopped breathing and HR dropped to 10  patient then woke up and started breathing pateint stated no longer than one minute; re-intubated in PACU following cholecystectomy 10/28/13  . COPD (chronic obstructive pulmonary disease) ([place])   . Diverticulosis   . Dysrhythmia   . Fibromyalgia   . GERD (gastroesophageal reflux disease)   . Gout   . H/O mitral valve replacement 2002, 2007  . Heart murmur   . Hemorrhoids   . Hyperlipidemia   . Hypothyroidism   . IBS (irritable bowel syndrome)   . MVP (mitral valve prolapse)   . Pacemaker   . Peptic ulcer disease     Surgeries: Procedure(s): RIGHT TOTAL SHOULDER ARTHROPLASTY on 12/27/2015   Consultants:   Discharged Condition: Improved  Hospital Course: [name] is an 75 y.o. female who was admitted 12/27/2015 for operative treatment of painful right shoulder arthritis. Patient has severe unremitting pain that affects sleep, daily activities, and work/hobbies. After pre-op clearance the patient was taken to the operating room on 12/27/2015 and underwent  Procedure(s): RIGHT TOTAL SHOULDER ARTHROPLASTY.    Patient was given perioperative antibiotics:  Anti-infectives    Start     Dose/Rate Route Frequency Ordered Stop   12/28/15 0000  ciprofloxacin (CIPRO) 500 MG tablet     500 mg Oral 2 times daily 12/28/15 0808 01/11/16 2359   12/27/15 2200  cefTRIAXone (ROCEPHIN) 2 g in dextrose 5 % 50 mL IVPB     2 g 100 mL/hr over 30 Minutes Intravenous Every 24 hours 12/27/15 1524     12/27/15 1230  ceFAZolin (ANCEF) IVPB 2g/100 mL premix     2 g 200 mL/hr over 30 Minutes Intravenous To [facility] 12/26/15 1114 12/27/15 1319       Patient was given sequential compression devices, early ambulation, and Lovenox to prevent DVT.  Patient benefited maximally from hospital stay. Intraoperative cultures were obtained with positive gram stain results. Infectious disease was consulted and rec Rocephin IV x 24 hrs. Cultures will be follow outpatiently. Discharged with cipro 581m BID. She is asymptomatic and stable.   Recent vital signs: Patient Vitals for the past 24 hrs:  BP Temp Temp src Pulse Resp SpO2 Height Weight  12/28/15 0543 (!) 132/55 98 F (36.7 C) Oral 60 16 94 % - -  12/28/15 0011 (!) 131/58 98 F (36.7 C) Oral 60 16 93 % - -  12/27/15 2042 (!) 115/45 98.4 F (36.9 C) Oral 62 16 93 % - -  12/27/15 1645 (!) 122/53 98.3 F (36.8 C) Oral 64 20 96 % - -  12/27/15 1635 - 97.6 F (36.4 C) - - - - - -  12/27/15 1618 (!) 125/49 - - 60 18 91 % - -  12/27/15 1615 - - - (!) 59 18 93 % - -  12/27/15 1604 (!) 124/54 - - 62 16 90 % - -  12/27/15 1600 - - - (!) 59 19 91 % - -  12/27/15 1548 (!) 126/55 - - 61 17 (!) 86 % - -  12/27/15 1545 - - - (!) 59 18 93 % - -  12/27/15 1535 (!) 138/59 97.2 F (36.2 C) - 60 16 98 % - -  12/27/15 1207 (!) 142/54 - - 65 20 98 % - -  12/27/15 1155 - - - 64 (!) 24 99 % - -  12/27/15 1151 - - - 61 14 97 % - -  12/27/15 1054 (!) 156/59 98.8 F (37.1 C) Oral 66 18 99 % 5' 2"  (1.575 m) 54.1 kg (119 lb 3.2 oz)     Recent laboratory studies:  Recent Labs  12/27/15 1119 12/28/15 0429  WBC  --  13.6*   HGB  --  9.9*  HCT  --  30.2*  PLT  --  249  NA  --  132*  K  --  4.3  CL  --  97*  CO2  --  24  BUN  --  14  CREATININE  --  1.43*  GLUCOSE  --  111*  INR 1.63  --   CALCIUM  --  8.7*     Discharge Medications:     Medication List    STOP taking these medications   acetaminophen 325 MG tablet Commonly known as:  TYLENOL     TAKE these medications   allopurinol 300 MG tablet Commonly known as:  ZYLOPRIM Take 300 mg by mouth every evening.   amiodarone 200 MG tablet Commonly known as:  PACERONE TAKE 1/2 TABLET BY MOUTH DAILY   amoxicillin 500 MG capsule Commonly known as:  AMOXIL TAKE 4 CAPSULES 1 HOUR PRIOR TO DENTAL PROCEDURES   ciprofloxacin 500 MG tablet Commonly known as:  CIPRO Take 1 tablet (500 mg total) by mouth 2 (two) times daily.   docusate sodium 100 MG capsule Commonly known as:  COLACE Take 1 capsule (100 mg total) by mouth 3 (three) times daily as needed.   enoxaparin 60 MG/0.6ML injection Commonly known as:  LOVENOX Inject 0.6 mLs (60 mg total) into the skin daily.   furosemide 40 MG tablet Commonly known as:  LASIX TAKE 2 TABLETS BY MOUTH EVERY MORNING AND 1 TAB EVERY AFTERNOON   gabapentin 300 MG capsule Commonly known as:  NEURONTIN Take 300 mg by mouth 2 (two) times daily.   HYDROcodone-acetaminophen 5-325 MG tablet Commonly known as:  NORCO Take 1-2 tablets by mouth every 4 (four) hours as needed for moderate pain.   levothyroxine 50 MCG tablet Commonly known as:  SYNTHROID, LEVOTHROID TAKE 1 TABLET (50 MCG TOTAL) BY MOUTH DAILY BEFORE BREAKFAST.   metoprolol tartrate 25 MG tablet Commonly known as:  LOPRESSOR Take 25 mg by mouth daily.   multivitamins ther. w/minerals Tabs tablet Take 1 tablet by mouth every morning.   omeprazole 40 MG capsule Commonly known as:  PRILOSEC TAKE ONE CAPSULE BY MOUTH EVERY DAY   pravastatin 40 MG tablet Commonly known as:  PRAVACHOL Take 40 mg by mouth daily.   PROAIR HFA 108 (90  Base) MCG/ACT inhaler Generic drug:  albuterol Inhale 1-2 puffs into the lungs every 6 (six) hours as needed for wheezing.  sertraline 25 MG tablet Commonly known as:  ZOLOFT Take 50 mg by mouth every morning.   spironolactone 25 MG tablet Commonly known as:  ALDACTONE TAKE 1 TABLET BY MOUTH EVERY DAY   traMADol 50 MG tablet Commonly known as:  ULTRAM Take 50 mg by mouth every 6 (six) hours as needed for moderate pain.   warfarin 2.5 MG tablet Commonly known as:  COUMADIN Take 1 to 1.5 tablets by mouth daily as directed by coumadin clinic What changed:  how much to take  how to take this  when to take this  additional instructions       Diagnostic Studies: Dg Chest 2 View  Result Date: 12/24/2015 CLINICAL DATA:  Preop right shoulder replacement.  History of COPD EXAM: CHEST  2 VIEW COMPARISON:  10/28/2013 FINDINGS: Prior median sternotomy and valve replacement. Left AICD remains in place, unchanged. There is hyperinflation of the lungs compatible with COPD. Heart and mediastinal contours are within normal limits. No focal opacities or effusions. No acute bony abnormality. IMPRESSION: COPD.  No active disease. Electronically Signed   By: Rolm Baptise M.D.   On: 12/24/2015 10:16  Dg Shoulder Right Port  Result Date: 12/27/2015 CLINICAL DATA:  Status post right shoulder arthroplasty. EXAM: PORTABLE RIGHT SHOULDER - 2+ VIEW COMPARISON:  12/24/2015 FINDINGS: Right shoulder arthroplasty appears well-seated and well aligned with the glenoid. There is no acute fracture or evidence of an operative complication. IMPRESSION: Well-positioned right shoulder arthroplasty. Electronically Signed   By: Lajean Manes M.D.   On: 12/27/2015 15:54   Disposition: 01-Home or Self Care  Discharge Instructions    Call MD / Call 911    Complete by:  As directed   If you experience chest pain or shortness of breath, CALL 911 and be transported to the hospital emergency room.  If you develope a  fever above 101 F, pus (white drainage) or increased drainage or redness at the wound, or calf pain, call your surgeon's office.   Constipation Prevention    Complete by:  As directed   Drink plenty of fluids.  Prune juice may be helpful.  You may use a stool softener, such as Colace (over the counter) 100 mg twice a day.  Use MiraLax (over the counter) for constipation as needed.   Diet - low sodium heart healthy    Complete by:  As directed   Increase activity slowly as tolerated    Complete by:  As directed      Follow-up Information    Nita Sells, MD. Schedule an appointment as soon as possible for a visit in 2 weeks.   Specialty:  Orthopedic Surgery Contact information: [place] [address] [place] [address] [phone]            Signed: Grier Mitts 12/28/2015, 8:45 AM

## 2015-12-30 ENCOUNTER — Other Ambulatory Visit: Payer: Self-pay | Admitting: Cardiovascular Disease

## 2016-01-01 LAB — AEROBIC/ANAEROBIC CULTURE (SURGICAL/DEEP WOUND): CULTURE: NO GROWTH

## 2016-01-01 LAB — AEROBIC/ANAEROBIC CULTURE W GRAM STAIN (SURGICAL/DEEP WOUND): Culture: NO GROWTH

## 2016-01-02 ENCOUNTER — Ambulatory Visit (INDEPENDENT_AMBULATORY_CARE_PROVIDER_SITE_OTHER): Payer: Medicare Other | Admitting: Pharmacist Clinician (PhC)/ Clinical Pharmacy Specialist

## 2016-01-02 DIAGNOSIS — Z7901 Long term (current) use of anticoagulants: Secondary | ICD-10-CM | POA: Diagnosis not present

## 2016-01-02 DIAGNOSIS — Z954 Presence of other heart-valve replacement: Secondary | ICD-10-CM

## 2016-01-02 DIAGNOSIS — Z952 Presence of prosthetic heart valve: Secondary | ICD-10-CM

## 2016-01-02 LAB — POCT INR: INR: 2.8

## 2016-01-04 ENCOUNTER — Encounter: Payer: Self-pay | Admitting: Cardiovascular Disease

## 2016-01-09 ENCOUNTER — Other Ambulatory Visit: Payer: Self-pay

## 2016-01-09 MED ORDER — AMIODARONE HCL 200 MG PO TABS: 100.0000 mg | ORAL_TABLET | Freq: Every day | ORAL | 6 refills | Status: DC

## 2016-01-23 ENCOUNTER — Ambulatory Visit (INDEPENDENT_AMBULATORY_CARE_PROVIDER_SITE_OTHER): Payer: Medicare Other | Admitting: Pharmacist

## 2016-01-23 DIAGNOSIS — Z954 Presence of other heart-valve replacement: Secondary | ICD-10-CM | POA: Diagnosis not present

## 2016-01-23 DIAGNOSIS — Z952 Presence of prosthetic heart valve: Secondary | ICD-10-CM

## 2016-01-23 DIAGNOSIS — Z7901 Long term (current) use of anticoagulants: Secondary | ICD-10-CM

## 2016-01-23 LAB — POCT INR: INR: 4.6

## 2016-01-24 ENCOUNTER — Ambulatory Visit (HOSPITAL_COMMUNITY): Payer: Medicare Other | Attending: Cardiology

## 2016-01-24 ENCOUNTER — Encounter (HOSPITAL_COMMUNITY): Payer: Self-pay

## 2016-02-07 ENCOUNTER — Ambulatory Visit (HOSPITAL_COMMUNITY): Payer: Medicare Other | Attending: Cardiovascular Disease

## 2016-02-07 ENCOUNTER — Other Ambulatory Visit: Payer: Self-pay

## 2016-02-07 DIAGNOSIS — I059 Rheumatic mitral valve disease, unspecified: Secondary | ICD-10-CM | POA: Insufficient documentation

## 2016-02-07 DIAGNOSIS — Z954 Presence of other heart-valve replacement: Secondary | ICD-10-CM | POA: Diagnosis not present

## 2016-02-07 DIAGNOSIS — Z952 Presence of prosthetic heart valve: Secondary | ICD-10-CM | POA: Diagnosis not present

## 2016-02-07 DIAGNOSIS — Z8249 Family history of ischemic heart disease and other diseases of the circulatory system: Secondary | ICD-10-CM | POA: Insufficient documentation

## 2016-02-07 DIAGNOSIS — N189 Chronic kidney disease, unspecified: Secondary | ICD-10-CM | POA: Insufficient documentation

## 2016-02-07 DIAGNOSIS — I340 Nonrheumatic mitral (valve) insufficiency: Secondary | ICD-10-CM | POA: Insufficient documentation

## 2016-02-07 DIAGNOSIS — Z87891 Personal history of nicotine dependence: Secondary | ICD-10-CM | POA: Insufficient documentation

## 2016-02-07 DIAGNOSIS — I509 Heart failure, unspecified: Secondary | ICD-10-CM | POA: Insufficient documentation

## 2016-02-07 DIAGNOSIS — J449 Chronic obstructive pulmonary disease, unspecified: Secondary | ICD-10-CM | POA: Insufficient documentation

## 2016-02-13 ENCOUNTER — Telehealth: Payer: Self-pay | Admitting: *Deleted

## 2016-02-13 ENCOUNTER — Ambulatory Visit (INDEPENDENT_AMBULATORY_CARE_PROVIDER_SITE_OTHER): Payer: Medicare Other | Admitting: Pharmacist

## 2016-02-13 DIAGNOSIS — Z7901 Long term (current) use of anticoagulants: Secondary | ICD-10-CM

## 2016-02-13 DIAGNOSIS — Z954 Presence of other heart-valve replacement: Secondary | ICD-10-CM

## 2016-02-13 DIAGNOSIS — Z952 Presence of prosthetic heart valve: Secondary | ICD-10-CM

## 2016-02-13 LAB — POCT INR: INR: 3.8

## 2016-02-13 NOTE — Telephone Encounter (Signed)
-----   Message from Lorretta Harp, MD sent at 02/08/2016 11:20 AM EDT ----- Looks good. No change in valve function or overall heart pumping function.

## 2016-02-28 NOTE — Telephone Encounter (Signed)
Patient received results on 02/22/16

## 2016-03-04 ENCOUNTER — Other Ambulatory Visit: Payer: Self-pay | Admitting: Cardiovascular Disease

## 2016-03-04 ENCOUNTER — Other Ambulatory Visit: Payer: Self-pay | Admitting: Internal Medicine

## 2016-03-04 DIAGNOSIS — Z1231 Encounter for screening mammogram for malignant neoplasm of breast: Secondary | ICD-10-CM

## 2016-03-04 NOTE — Telephone Encounter (Signed)
Rx request sent to pharmacy.  

## 2016-03-05 ENCOUNTER — Ambulatory Visit (INDEPENDENT_AMBULATORY_CARE_PROVIDER_SITE_OTHER): Payer: Medicare Other | Admitting: Pharmacist

## 2016-03-05 DIAGNOSIS — Z952 Presence of prosthetic heart valve: Secondary | ICD-10-CM | POA: Diagnosis not present

## 2016-03-05 DIAGNOSIS — Z7901 Long term (current) use of anticoagulants: Secondary | ICD-10-CM | POA: Diagnosis not present

## 2016-03-05 LAB — POCT INR: INR: 2.5

## 2016-03-06 ENCOUNTER — Other Ambulatory Visit: Payer: Self-pay | Admitting: Cardiovascular Disease

## 2016-03-24 ENCOUNTER — Encounter: Payer: Self-pay | Admitting: Cardiovascular Disease

## 2016-03-24 ENCOUNTER — Ambulatory Visit (INDEPENDENT_AMBULATORY_CARE_PROVIDER_SITE_OTHER): Payer: Medicare Other | Admitting: Pharmacist

## 2016-03-24 ENCOUNTER — Ambulatory Visit (INDEPENDENT_AMBULATORY_CARE_PROVIDER_SITE_OTHER): Payer: Medicare Other | Admitting: Cardiovascular Disease

## 2016-03-24 VITALS — BP 124/66 | HR 70 | Ht 61.5 in | Wt 119.0 lb

## 2016-03-24 DIAGNOSIS — I5032 Chronic diastolic (congestive) heart failure: Secondary | ICD-10-CM

## 2016-03-24 DIAGNOSIS — I480 Paroxysmal atrial fibrillation: Secondary | ICD-10-CM

## 2016-03-24 DIAGNOSIS — E038 Other specified hypothyroidism: Secondary | ICD-10-CM | POA: Diagnosis not present

## 2016-03-24 DIAGNOSIS — Z79899 Other long term (current) drug therapy: Secondary | ICD-10-CM | POA: Diagnosis not present

## 2016-03-24 DIAGNOSIS — Z7901 Long term (current) use of anticoagulants: Secondary | ICD-10-CM | POA: Diagnosis not present

## 2016-03-24 DIAGNOSIS — Z952 Presence of prosthetic heart valve: Secondary | ICD-10-CM | POA: Diagnosis not present

## 2016-03-24 DIAGNOSIS — Z95810 Presence of automatic (implantable) cardiac defibrillator: Secondary | ICD-10-CM

## 2016-03-24 LAB — POCT INR: INR: 3.3

## 2016-03-24 NOTE — Progress Notes (Signed)
Cardiology Office Note    Date:  03/25/2016   ID:  [name], DOB 1940-07-05, MRN [id number]  PCP:  [name], MD  Cardiologist:  Quay Burow, M.D.; Sanda Klein, MD   Chief Complaint  Patient presents with  . Follow-up    occassional chest pain-unsure if its indegestation, has vertigo occassionally  Preoperative evaluation  History of Present Illness:  [name] is a 75 y.o. female here for follow-up on her biventricular pacemaker/defibrillator And chronic heart failure.  She underwent right shoulder replacement with good results and without any bleeding or embolic complications. As before she did self-administered enoxaparin "bridging". She has greatly improved range of motion in her right arm. She continues to have a very painful and restricted left shoulder. A magnet was taped over her defibrillator during the procedure and no arrhythmic issues occurred. Plans for left shoulder replacement in February  She is on chronic warfarin anticoagulation for mitral valve replacement with a mechanical prosthesis and atrial fibrillation.   Shee denies problems with angina, dyspnea, palpitations, syncope, edema, focal neurological events or any bleeding problems.  Interrogation of her defibrillator does show some signs of volume buildup. Her Optivol is steadily worsening over the last couple of weeks. Her nephrologist reduced her diuretic dose from 120 mg daily down to only 40 mg daily several weeks ago. She noticed that her urine output was very low and was feeling a little "tight" so she self increased the diuretic dose to 80 mg daily about a week ago.  Otherwise, The device check today shows normal function. She has a Artist XT CRT-D implanted in 2012. Generator voltage is 2.75 V (ERI 2.63 V). She has 99.% biventricular pacing. She also has 71% atrial pacing. She has not had atrial fibrillation or ventricular tachycardia. She is not pacemaker  dependent.  [name] has a long-standing history of valvular heart disease, undergoing mitral valve repair in 2002, followed by a mitral valve replacement with a mechanical prosthesis in 2007. Left ventricular ejection fraction which has been as low as 10% improve substantially after implantation of the CRT-D device and now her LVEF is normal. She received her initial CRT-D in 2008 and underwent a generator change out in 2012. She has not received VT therapy. She has 100% biventricular pacing efficiency. She has a Riata 7 [place] ICD lead under advisory, which appeared fluoroscopically normal at the time of for generator change. Did not have any angiographic CAD prior to her surgery.   Past Medical History:  Diagnosis Date  . Anemia   . Anxiety   . Arthritis   . Asthma   . 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 Protecta  . Biventricular ICD (implantable cardioverter-defibrillator) in place    with CRT  . Blood transfusion without reported diagnosis   . Bursitis   . Cataract    RIGHT EYE  . CHF (congestive heart failure) ([place])   . Chronic kidney disease   . Colon polyp    adenomatous  . Complication of anesthesia    patient stated that had difficulty getting the breathing tube removed, patient said that she stopped breathing and HR dropped to 10  patient then woke up and started breathing pateint stated no longer than one minute; re-intubated in PACU following cholecystectomy 10/28/13  . COPD (chronic obstructive pulmonary disease) ([place])   . Diverticulosis   . Dysrhythmia   . Fibromyalgia   . GERD (gastroesophageal reflux disease)   . Gout   .  H/O mitral valve replacement 2002, 2007  . Heart murmur   . Hemorrhoids   . Hyperlipidemia   . Hypothyroidism   . IBS (irritable bowel syndrome)   . MVP (mitral valve prolapse)   . Pacemaker   . Peptic ulcer disease     Past Surgical History:  Procedure Laterality Date  . ABDOMINAL HYSTERECTOMY    .  CARDIAC CATHETERIZATION  02/25/2006   normal left main, normal LAD, normal L Cfx, normal/dominant RCA (Dr. [name])  . CARDIAC DEFIBRILLATOR PLACEMENT  2007, 11/202012   x2 (pacemaker) (Dr. Jerilynn Mages. Dewight Catino)  . CARDIAC VALVE REPLACEMENT  2002   MV repair - Dr. Berle Mull  . CHOLECYSTECTOMY N/A 10/28/2013   Procedure: LAPAROSCOPIC CHOLECYSTECTOMY WITH INTRAOPERATIVE CHOLANGIOGRAM;  Surgeon: Edward Jolly, MD;  Location: [facility];  Service: General;  Laterality: N/A;  . COLONOSCOPY    . EYE SURGERY    . IMPLANTABLE CARDIOVERTER DEFIBRILLATOR (ICD) GENERATOR CHANGE N/A 04/10/2011   Procedure: ICD GENERATOR CHANGE;  Surgeon: Sanda Klein, MD;  Location: [facility];  Service: Cardiovascular;  Laterality: N/A;  . KNEE ARTHROSCOPY    . MITRAL VALVE REPLACEMENT  02/26/2006   re-do MVR w/38m St. [name] (Dr. BEllison Hughs  . NM MYOCAR PERF WALL MOTION  2005   persantine myoview - low ris, EF 63%  . RIGHT HEART CATH  04/03/2006   pulm cap wedge pressure 24/24, PA pressure 43/22 (mean 372mg), CO 4.8, CI 4.1 (Dr. J.Jackie Plum . TOTAL SHOULDER ARTHROPLASTY Right 12/27/2015   Procedure: RIGHT TOTAL SHOULDER ARTHROPLASTY;  Surgeon: JuTania AdeMD;  [place] [facility] Service: [place]ics;  Laterality: Right;  Right total shoulder arthroplasty  . TRANSTHORACIC ECHOCARDIOGRAM  12/2011   EF 50-55%, mild global hypokinesis; LA severely dilated; calcification of anterior/posterior MV leaflets, bi-leaflets St. [name] mechanical MV; mild TR; trace AV regurg/pulm valve regurg    Current Medications: Outpatient Medications Prior to Visit  Medication Sig Dispense Refill  . allopurinol (ZYLOPRIM) 300 MG tablet Take 300 mg by mouth every evening.     . Marland Kitchenmiodarone (PACERONE) 200 MG tablet Take 0.5 tablets (100 mg total) by mouth daily. 30 tablet 6  . amoxicillin (AMOXIL) 500 MG capsule TAKE 4 CAPSULES 1 HOUR PRIOR TO DENTAL PROCEDURES 4 capsule 1  . furosemide (LASIX) 40 MG tablet TAKE 2 TABLETS BY MOUTH EVERY MORNING  AND 1 TAB EVERY AFTERNOON 90 tablet 4  . gabapentin (NEURONTIN) 300 MG capsule Take 300 mg by mouth 2 (two) times daily.     . Marland Kitchenevothyroxine (SYNTHROID, LEVOTHROID) 50 MCG tablet TAKE 1 TABLET (50 MCG TOTAL) BY MOUTH DAILY BEFORE BREAKFAST. 30 tablet 6  . metoprolol tartrate (LOPRESSOR) 25 MG tablet Take 25 mg by mouth daily.     . Multiple Vitamins-Minerals (MULTIVITAMINS THER. W/MINERALS) TABS Take 1 tablet by mouth every morning.     . Marland Kitchenmeprazole (PRILOSEC) 40 MG capsule TAKE ONE CAPSULE BY MOUTH EVERY DAY 30 capsule 8  . pravastatin (PRAVACHOL) 40 MG tablet Take 40 mg by mouth daily.    . Marland KitchenROAIR HFA 108 (90 BASE) MCG/ACT inhaler Inhale 1-2 puffs into the lungs every 6 (six) hours as needed for wheezing.     . sertraline (ZOLOFT) 25 MG tablet Take 50 mg by mouth every morning.     . Marland Kitchenpironolactone (ALDACTONE) 25 MG tablet Take 1 tablet (25 mg total) by mouth daily. 30 tablet 11  . warfarin (COUMADIN) 2.5 MG tablet TAKE 1 TO 1.5 TABLETS BY MOUTH DAILY AS  DIRECTED BY COUMADIN CLINIC 135 tablet 1  . docusate sodium (COLACE) 100 MG capsule Take 1 capsule (100 mg total) by mouth 3 (three) times daily as needed. 20 capsule 0  . enoxaparin (LOVENOX) 60 MG/0.6ML injection Inject 0.6 mLs (60 mg total) into the skin daily. 10 Syringe 1  . HYDROcodone-acetaminophen (NORCO) 5-325 MG tablet Take 1-2 tablets by mouth every 4 (four) hours as needed for moderate pain. 60 tablet 0  . traMADol (ULTRAM) 50 MG tablet Take 50 mg by mouth every 6 (six) hours as needed for moderate pain.      No facility-administered medications prior to visit.      Allergies:   Ace inhibitors; Lipitor [atorvastatin calcium]; and Codeine   Social History   Social History  . Marital status: Married    Spouse name: N/A  . Number of children: 4  . Years of education: N/A   Occupational History  . retired    Social History Main Topics  . Smoking status: Former Smoker    Types: Cigarettes    Quit date: 06/02/2001  .  Smokeless tobacco: Never Used  . Alcohol use 0.0 oz/week     Comment: social-1 every 6 months  . Drug use: No  . Sexual activity: Not Asked   Other Topics Concern  . None   Social History Narrative  . None     Family History:  The patient's family history includes Breast cancer in her sister; Crohn's disease in her sister; Heart attack in her father and mother; Heart disease in her father, mother, sister, and son; Kidney disease in her father; Uterine cancer in her sister.   ROS:   Please see the history of present illness.    ROS All other systems reviewed and are negative.   PHYSICAL EXAM:   VS:  BP 124/66 (BP Location: Left Arm, Patient Position: Sitting, Cuff Size: Normal)   Pulse 70   Ht 5' 1.5" (1.562 m)   Wt 119 lb (54 kg)   BMI 22.12 kg/m    GEN: Well nourished, well developed, in no acute distress  HEENT: normal  Neck: JVP roughly 7-8 centimeters above the sternal angle, carotid bruits, or masses Cardiac: Crisp mechanical valve clicks RRR; no murmurs, rubs, or gallops,no edema, healthy left subclavian ICD site  Respiratory:  clear to auscultation bilaterally, normal work of breathing GI: soft, nontender, nondistended, + BS MS: no deformity or atrophy  Skin: warm and dry, no rash Neuro:  Alert and Oriented x 3, Strength and sensation are intact Psych: euthymic mood, full affect  Wt Readings from Last 3 Encounters:  03/24/16 119 lb (54 kg)  12/27/15 119 lb 3.2 oz (54.1 kg)  12/20/15 119 lb 3.2 oz (54.1 kg)      Studies/Labs Reviewed:   EKG:  EKG is not ordered today.    Recent Labs: 12/28/2015: Hemoglobin 9.9; Platelets 249 03/24/2016: ALT 12; Brain Natriuretic Peptide 105.0; BUN 13; Creat 1.14; Potassium 5.2; Sodium 140; TSH 2.84    ASSESSMENT:    1. Chronic diastolic congestive heart failure ([place])   2. Biventricular ICD (implantable cardioverter-defibrillator) in place   3. History of mitral valve replacement with mechanical valve   4. Paroxysmal  atrial fibrillation ([facility])   5. Long term current use of anticoagulant therapy   6. On amiodarone therapy   7. Other specified hypothyroidism   8. Medication management      PLAN:  In order of problems listed above:  1. CHF: Although she does  not have overt edema and denies shortness of breath she does have elevated jugular veins and her thoracic impedance is steadily deteriorating. May have to increase her diuretic dose again. Her creatinine was mildly abnormal at roughly 1.3 when she saw her nephrologist. I think we may have to increase the diuretic dose again but will recheck her metabolic panel today. 2. CRT-D: Normal function of her biventricular pacemaker defibrillator. She is not pacemaker dependent. The device was implanted for congestive heart failure due to dyssynchrony. She is a "hyper responder" with her LVEF increasing from 10% to normal after resynchronization therapy (last echo 2016). When she has left shoulder surgery I would recommend actually programming tachycardia therapies off since electrocautery use will be so close to the device. 3. S/P MVR mechanical prosthesis: Normal function of the prosthesis by her last echo. Repeat echo this year. 4. AFib: Not recorded by her device in a long time. On amiodarone therapy  5. Warfarin: She has done well with self-administered enoxaparin anticoagulation. We can do this again for planned surgery in February. 6. Amiodarone: needs to have liver function tests and thyroid function tests periodically.     Medication Adjustments/Labs and Tests Ordered: Current medicines are reviewed at length with the patient today.  Concerns regarding medicines are outlined above.  Medication changes, Labs and Tests ordered today are listed in the Patient Instructions below. Patient Instructions  Dr Sallyanne Kuster recommends that you continue on your current medications as directed. Please refer to the Current Medication list given to you today.  Your  physician recommends that you return for lab work at your convenience.  Remote monitoring is used to monitor your Pacemaker of ICD from home. This monitoring reduces the number of office visits required to check your device to one time per year. It allows [place] to keep an eye on the functioning of your device to ensure it is working properly. You are scheduled for a device check from home on Monday, January 22nd, 2018. You may send your transmission at any time that day. If you have a wireless device, the transmission will be sent automatically. After your physician reviews your transmission, you will receive a postcard with your next transmission date.  Dr Sallyanne Kuster recommends that you schedule a follow-up appointment in 6 months with a device check. You will receive a reminder letter in the mail two months in advance. If you don't receive a letter, please call our office to schedule the follow-up appointment.  If you need a refill on your cardiac medications before your next appointment, please call your pharmacy.    Signed, Sanda Klein, MD  03/25/2016 6:14 PM    [facility] [address], [place], [place]  [address] Phone: [phone]; Fax: [phone]

## 2016-03-24 NOTE — Patient Instructions (Signed)
Dr Sallyanne Kuster recommends that you continue on your current medications as directed. Please refer to the Current Medication list given to you today.  Your physician recommends that you return for lab work at your convenience.  Remote monitoring is used to monitor your Pacemaker of ICD from home. This monitoring reduces the number of office visits required to check your device to one time per year. It allows [place] to keep an eye on the functioning of your device to ensure it is working properly. You are scheduled for a device check from home on Monday, January 22nd, 2018. You may send your transmission at any time that day. If you have a wireless device, the transmission will be sent automatically. After your physician reviews your transmission, you will receive a postcard with your next transmission date.  Dr Sallyanne Kuster recommends that you schedule a follow-up appointment in 6 months with a device check. You will receive a reminder letter in the mail two months in advance. If you don't receive a letter, please call our office to schedule the follow-up appointment.  If you need a refill on your cardiac medications before your next appointment, please call your pharmacy.

## 2016-03-25 DIAGNOSIS — I480 Paroxysmal atrial fibrillation: Secondary | ICD-10-CM | POA: Insufficient documentation

## 2016-03-25 DIAGNOSIS — I5032 Chronic diastolic (congestive) heart failure: Secondary | ICD-10-CM | POA: Insufficient documentation

## 2016-03-25 DIAGNOSIS — I5043 Acute on chronic combined systolic (congestive) and diastolic (congestive) heart failure: Secondary | ICD-10-CM | POA: Insufficient documentation

## 2016-03-25 DIAGNOSIS — I5033 Acute on chronic diastolic (congestive) heart failure: Secondary | ICD-10-CM | POA: Insufficient documentation

## 2016-03-25 LAB — COMPREHENSIVE METABOLIC PANEL
ALBUMIN: 4.2 g/dL (ref 3.6–5.1)
ALT: 12 U/L (ref 6–29)
AST: 28 U/L (ref 10–35)
Alkaline Phosphatase: 100 U/L (ref 33–130)
BUN: 13 mg/dL (ref 7–25)
CHLORIDE: 101 mmol/L (ref 98–110)
CO2: 23 mmol/L (ref 20–31)
CREATININE: 1.14 mg/dL — AB (ref 0.60–0.93)
Calcium: 9.8 mg/dL (ref 8.6–10.4)
Glucose, Bld: 89 mg/dL (ref 65–99)
POTASSIUM: 5.2 mmol/L (ref 3.5–5.3)
SODIUM: 140 mmol/L (ref 135–146)
TOTAL PROTEIN: 7.1 g/dL (ref 6.1–8.1)
Total Bilirubin: 0.5 mg/dL (ref 0.2–1.2)

## 2016-03-25 LAB — TSH: TSH: 2.84 m[IU]/L

## 2016-03-25 LAB — BRAIN NATRIURETIC PEPTIDE: BRAIN NATRIURETIC PEPTIDE: 105 pg/mL — AB (ref <100)

## 2016-03-28 ENCOUNTER — Ambulatory Visit
Admission: RE | Admit: 2016-03-28 | Discharge: 2016-03-28 | Disposition: A | Discharge status: Home / Self Care | Payer: Medicare Other | Source: Ambulatory Visit | Attending: Internal Medicine | Admitting: Internal Medicine

## 2016-03-28 DIAGNOSIS — Z1231 Encounter for screening mammogram for malignant neoplasm of breast: Secondary | ICD-10-CM

## 2016-04-04 ENCOUNTER — Other Ambulatory Visit: Payer: Self-pay

## 2016-04-04 MED ORDER — LEVOTHYROXINE SODIUM 50 MCG PO TABS: ORAL_TABLET | ORAL | 6 refills | Status: DC

## 2016-04-07 ENCOUNTER — Other Ambulatory Visit: Payer: Self-pay | Admitting: Cardiovascular Disease

## 2016-04-07 NOTE — Telephone Encounter (Signed)
Rx request sent to pharmacy.  

## 2016-04-08 ENCOUNTER — Other Ambulatory Visit: Payer: Self-pay

## 2016-04-08 MED ORDER — LEVOTHYROXINE SODIUM 50 MCG PO TABS: 50.0000 ug | ORAL_TABLET | Freq: Every day | ORAL | 10 refills | Status: DC

## 2016-04-21 ENCOUNTER — Ambulatory Visit (INDEPENDENT_AMBULATORY_CARE_PROVIDER_SITE_OTHER): Payer: Medicare Other | Admitting: Pharmacist

## 2016-04-21 DIAGNOSIS — Z7901 Long term (current) use of anticoagulants: Secondary | ICD-10-CM

## 2016-04-21 DIAGNOSIS — Z952 Presence of prosthetic heart valve: Secondary | ICD-10-CM

## 2016-04-21 LAB — POCT INR: INR: 3.4

## 2016-06-03 ENCOUNTER — Ambulatory Visit (INDEPENDENT_AMBULATORY_CARE_PROVIDER_SITE_OTHER): Payer: Medicare Other | Admitting: Pharmacist

## 2016-06-03 DIAGNOSIS — Z7901 Long term (current) use of anticoagulants: Secondary | ICD-10-CM | POA: Diagnosis not present

## 2016-06-03 DIAGNOSIS — Z952 Presence of prosthetic heart valve: Secondary | ICD-10-CM

## 2016-06-03 LAB — POCT INR: INR: 5

## 2016-06-06 ENCOUNTER — Other Ambulatory Visit: Payer: Self-pay | Admitting: Internal Medicine

## 2016-06-06 DIAGNOSIS — R110 Nausea: Secondary | ICD-10-CM

## 2016-06-09 ENCOUNTER — Other Ambulatory Visit: Payer: Self-pay | Admitting: Cardiovascular Disease

## 2016-06-10 ENCOUNTER — Ambulatory Visit
Admission: RE | Admit: 2016-06-10 | Discharge: 2016-06-10 | Disposition: A | Discharge status: Home / Self Care | Payer: Medicare Other | Source: Ambulatory Visit | Attending: Internal Medicine | Admitting: Internal Medicine

## 2016-06-10 ENCOUNTER — Telehealth: Payer: Self-pay | Admitting: Cardiovascular Disease

## 2016-06-10 ENCOUNTER — Other Ambulatory Visit: Payer: Self-pay | Admitting: Cardiovascular Disease

## 2016-06-10 DIAGNOSIS — R110 Nausea: Secondary | ICD-10-CM

## 2016-06-10 MED ORDER — SPIRONOLACTONE 25 MG PO TABS: 25.0000 mg | ORAL_TABLET | Freq: Every day | ORAL | 5 refills | Status: DC

## 2016-06-10 NOTE — Telephone Encounter (Signed)
Rx(s) sent to pharmacy electronically.  

## 2016-06-10 NOTE — Telephone Encounter (Signed)
Goes directly to VM. Left msg for patient to call.

## 2016-06-10 NOTE — Telephone Encounter (Signed)
See duplicate note.

## 2016-06-10 NOTE — Telephone Encounter (Signed)
New message   Pt verbalized that she wants to speak to the rn

## 2016-06-10 NOTE — Telephone Encounter (Signed)
Refill needed for spironolactone. Pt identified no other acute concerns. Refilled med at patient request. She's aware to follow up w scheduled appts and call if further needs.

## 2016-06-10 NOTE — Telephone Encounter (Signed)
°  New Prob  Pt has some questions regarding spironolactone (ALDACTONE) 25 MG tablet. States pharmacy told her Dr. Gwenlyn Found refused this medication. Please call.

## 2016-06-16 ENCOUNTER — Ambulatory Visit (INDEPENDENT_AMBULATORY_CARE_PROVIDER_SITE_OTHER): Payer: Medicare Other | Admitting: Pharmacist

## 2016-06-16 DIAGNOSIS — Z7901 Long term (current) use of anticoagulants: Secondary | ICD-10-CM | POA: Diagnosis not present

## 2016-06-16 DIAGNOSIS — Z952 Presence of prosthetic heart valve: Secondary | ICD-10-CM | POA: Diagnosis not present

## 2016-06-16 LAB — POCT INR: INR: 3.7

## 2016-06-23 ENCOUNTER — Ambulatory Visit (INDEPENDENT_AMBULATORY_CARE_PROVIDER_SITE_OTHER): Payer: Medicare Other | Admitting: *Deleted

## 2016-06-23 ENCOUNTER — Telehealth: Payer: Self-pay | Admitting: Cardiology

## 2016-06-23 DIAGNOSIS — I480 Paroxysmal atrial fibrillation: Secondary | ICD-10-CM

## 2016-06-23 NOTE — Telephone Encounter (Signed)
Spoke with pt and reminded pt of remote transmission that is due today. Pt verbalized understanding.   

## 2016-06-24 ENCOUNTER — Other Ambulatory Visit: Payer: Self-pay | Admitting: Cardiovascular Disease

## 2016-06-24 LAB — CUP PACEART REMOTE DEVICE CHECK
Battery Voltage: 2.66 V
HighPow Impedance: 91 Ohm
Implantable Lead Implant Date: 20080221
Implantable Lead Implant Date: 20080221
Implantable Lead Location: 753858
Implantable Lead Location: 753859
Implantable Lead Location: 753860
Implantable Lead Model: 5076
Implantable Lead Model: 7002
Implantable Lead Serial Number: 134379
Implantable Pulse Generator Implant Date: 20080221
Lead Channel Impedance Value: 418 Ohm
Lead Channel Impedance Value: 532 Ohm
Lead Channel Setting Pacing Pulse Width: 0.4 ms
Lead Channel Setting Sensing Sensitivity: 0.3 mV
MDC IDC LEAD IMPLANT DT: 20080221
MDC IDC MSMT LEADCHNL LV IMPEDANCE VALUE: 551 Ohm
MDC IDC SESS DTM: 20180123131420
MDC IDC SET LEADCHNL LV PACING AMPLITUDE: 1.75 V
MDC IDC SET LEADCHNL RA PACING AMPLITUDE: 1.5 V
MDC IDC SET LEADCHNL RV PACING AMPLITUDE: 2.25 V
MDC IDC SET LEADCHNL RV PACING PULSEWIDTH: 0.4 ms
MDC IDC STAT BRADY RA PERCENT PACED: 68 %
MDC IDC STAT BRADY RV PERCENT PACED: 98 %

## 2016-06-24 NOTE — Progress Notes (Signed)
Remote ICD transmission.   

## 2016-06-25 ENCOUNTER — Encounter: Payer: Self-pay | Admitting: Cardiology

## 2016-06-26 ENCOUNTER — Encounter: Payer: Self-pay | Admitting: Physician Assistant

## 2016-06-26 ENCOUNTER — Telehealth: Payer: Self-pay | Admitting: Cardiovascular Disease

## 2016-06-26 ENCOUNTER — Ambulatory Visit (INDEPENDENT_AMBULATORY_CARE_PROVIDER_SITE_OTHER): Payer: Medicare Other | Admitting: Physician Assistant

## 2016-06-26 ENCOUNTER — Telehealth: Payer: Self-pay | Admitting: Emergency Medicine

## 2016-06-26 VITALS — BP 116/60 | HR 72 | Ht 62.0 in | Wt 112.0 lb

## 2016-06-26 DIAGNOSIS — R112 Nausea with vomiting, unspecified: Secondary | ICD-10-CM

## 2016-06-26 DIAGNOSIS — R197 Diarrhea, unspecified: Secondary | ICD-10-CM | POA: Diagnosis not present

## 2016-06-26 DIAGNOSIS — R1310 Dysphagia, unspecified: Secondary | ICD-10-CM | POA: Diagnosis not present

## 2016-06-26 DIAGNOSIS — K921 Melena: Secondary | ICD-10-CM

## 2016-06-26 DIAGNOSIS — R1084 Generalized abdominal pain: Secondary | ICD-10-CM

## 2016-06-26 DIAGNOSIS — K219 Gastro-esophageal reflux disease without esophagitis: Secondary | ICD-10-CM

## 2016-06-26 DIAGNOSIS — R634 Abnormal weight loss: Secondary | ICD-10-CM

## 2016-06-26 DIAGNOSIS — R935 Abnormal findings on diagnostic imaging of other abdominal regions, including retroperitoneum: Secondary | ICD-10-CM

## 2016-06-26 MED ORDER — OMEPRAZOLE 40 MG PO CPDR: 40.0000 mg | DELAYED_RELEASE_CAPSULE | Freq: Every day | ORAL | 2 refills | Status: DC

## 2016-06-26 MED ORDER — NA SULFATE-K SULFATE-MG SULF 17.5-3.13-1.6 GM/177ML PO SOLN: 1.0000 | ORAL | 0 refills | Status: DC

## 2016-06-26 MED ORDER — ONDANSETRON 8 MG PO TBDP: 8.0000 mg | ORAL_TABLET | Freq: Four times a day (QID) | ORAL | 2 refills | Status: DC | PRN

## 2016-06-26 NOTE — Progress Notes (Signed)
Chief Complaint: Nausea and vomiting, diarrhea, dysphagia, abdominal pain, early satiety, melena and weight loss  HPI:    [name] is a 76 year old Caucasian female with multiple significant medical problems including asthma, hypertension, hyperlipidemia, mitral valve replacement on chronic systemic anticoagulation in the form of Coumadin, congestive heart failure with implantable defibrillator (last echo 02/07/16 with an EF of 50%), COPD, pacemaker placement, chronic renal insufficiency, fibromyalgia, hyperlipidemia and adenomatous colon polyps, who returns to clinic today for a complaint of nausea and vomiting, diarrhea, dysphagia, abdominal pain, early satiety, melena and weight loss .      She regularly follows Dr. Henrene Pastor with her last colonoscopy 04/10/14 for surveillance for history of adenomatous colon polyps on exams in 2002, 05 and 10. At that time patient had finding of 2 polyps in the rectum and transverse colon as well as moderate diverticulosis in the sigmoid colon and otherwise normal exam. Pathology revealed a tubular adenoma and hyperplastic polyp. Repeat was recommended in 5 years. Last EGD was performed in 2009 due to nausea, vomiting and dark stool. The exam was normal and the patient was diagnosed with GERD. Patient saw Dr. Henrene Pastor in clinic on 03/29/14, this was regarding her screening colonoscopy. It was noted that she did have chronic constipation for which she took senna at night with good results. At that time patient was recommended to have a colonoscopy with results as above.   Patient had recent upper GI 06/10/16 which showed that the barium pill lodged above the gastroesophageal junction suggesting a short segment distal esophageal stricture. No definite hiatal hernia or gastroesophageal reflux. Suboptimal distention of the stomach but no gastric abnormality. The duodenum was unremarkable. Mild tertiary contractions in the distal esophagus.   Today, the patient is very disgruntled.  She tells me that she has had symptoms for over a year now. Initially this all started after the patient was placed on levothyroxine for thyroid problems and at that time she began with stomach difficulties and all of these problems. She was told these were just "side effects that I had to live with". Patient describes today that since that time she has had episodes of nausea and vomiting. She tells me her nausea is constant and she will vomit the first meal of the day every morning right after swallowing it or within 20-30 minutes after swallowing it. She has been on meclizine which does not help with this symptom. She also continue with nausea throughout the day but typically does not vomit anymore.   Patient also tells me she had a recent x-ray as above which showed dysphagia. She does describe some dysphagia symptoms noting that even liquids seem to get stuck on the way down and will come back up at times. This has been increasing recently.   Patient also describes today that once she started on levothyroxine she started with diarrhea at least 6-7 times per day and watery. She was changed from levothyroxine to a "different thyroid pill" 3 weeks ago and has had a decrease in her diarrhea recently. Now she just has to run urgently to the bathroom 20-30 minutes after eating in order to have a liquid bowel movement. She does tell me that for the past 3 months at least 2 of her bowel movements per day have been black and sticky in nature. She does tell me she has recently started on iron over the past couple of weeks because of a "mild anemia" which was discovered at her kidney doctor. Associated symptoms include  a feeling of chills and night sweats.   Patient also describes uncontrolled reflux irregardless of her Omeprazole 40 mg daily. This is associated with an early satiety and a feeling of "being full". Patient describes a weight loss of around 13 pounds over the past few months because she "just can't eat  anymore". Patient is on a mainly soft diet.   Patient also describes a generalized abdominal pain which is worse in her epigastrium and bilateral lower abdomen. This sometimes awakens her from sleep. She describes this as sharp in nature at least a 6-7/10. She tells me she falls asleep at night with a heating pad over her lower belly. She describes this as a feeling of a "cramp".   Patient's medical history is also positive for getting "denture work" done recently.   Patient is maintained on Coumadin. This is prescribed by Dr. Gwenlyn Found.   She denies fever, bright red blood in her stool, change in diet, recent antibiotics or shortness of breath.  Past Medical History:  Diagnosis Date  . Anemia   . Anxiety   . Arthritis   . Asthma   . 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 Protecta  . Biventricular ICD (implantable cardioverter-defibrillator) in place    with CRT  . Blood transfusion without reported diagnosis   . Bursitis   . Cataract    RIGHT EYE  . CHF (congestive heart failure) ([place])   . Chronic kidney disease   . Colon polyp    adenomatous  . Complication of anesthesia    patient stated that had difficulty getting the breathing tube removed, patient said that she stopped breathing and HR dropped to 10  patient then woke up and started breathing pateint stated no longer than one minute; re-intubated in PACU following cholecystectomy 10/28/13  . COPD (chronic obstructive pulmonary disease) ([place])   . Diverticulosis   . Dysrhythmia   . Fibromyalgia   . GERD (gastroesophageal reflux disease)   . Gout   . H/O mitral valve replacement 2002, 2007  . Heart murmur   . Hemorrhoids   . Hyperlipidemia   . Hypothyroidism   . IBS (irritable bowel syndrome)   . MVP (mitral valve prolapse)   . Pacemaker   . Peptic ulcer disease     Past Surgical History:  Procedure Laterality Date  . ABDOMINAL HYSTERECTOMY    . CARDIAC CATHETERIZATION  02/25/2006   normal  left main, normal LAD, normal L Cfx, normal/dominant RCA (Dr. [name])  . CARDIAC DEFIBRILLATOR PLACEMENT  2007, 11/202012   x2 (pacemaker) (Dr. Jerilynn Mages. [place])  . CARDIAC VALVE REPLACEMENT  2002   MV repair - Dr. Berle Mull  . CHOLECYSTECTOMY N/A 10/28/2013   Procedure: LAPAROSCOPIC CHOLECYSTECTOMY WITH INTRAOPERATIVE CHOLANGIOGRAM;  Surgeon: Edward Jolly, MD;  Location: [facility];  Service: General;  Laterality: N/A;  . COLONOSCOPY    . EYE SURGERY    . IMPLANTABLE CARDIOVERTER DEFIBRILLATOR (ICD) GENERATOR CHANGE N/A 04/10/2011   Procedure: ICD GENERATOR CHANGE;  Surgeon: Sanda Klein, MD;  Location: [facility] CATH [facility];  Service: Cardiovascular;  Laterality: N/A;  . KNEE ARTHROSCOPY    . MITRAL VALVE REPLACEMENT  02/26/2006   re-do MVR w/1m [employer] [name] (Dr. BEllison Hughs  . NM MYOCAR PERF WALL MOTION  2005   persantine myoview - low ris, EF 63%  . RIGHT HEART CATH  04/03/2006   pulm cap wedge pressure 24/24, PA pressure 43/22 (mean 327mg), CO 4.8, CI 4.1 (Dr. J.Jackie Plum .  TOTAL SHOULDER ARTHROPLASTY Right 12/27/2015   Procedure: RIGHT TOTAL SHOULDER ARTHROPLASTY;  Surgeon: Tania Ade, MD;  Location: [facility];  Service: Orthopedics;  Laterality: Right;  Right total shoulder arthroplasty  . TRANSTHORACIC ECHOCARDIOGRAM  12/2011   EF 50-55%, mild global hypokinesis; LA severely dilated; calcification of anterior/posterior MV leaflets, bi-leaflets St. [name] mechanical MV; mild TR; trace AV regurg/pulm valve regurg    Current Outpatient Prescriptions  Medication Sig Dispense Refill  . allopurinol (ZYLOPRIM) 300 MG tablet Take 300 mg by mouth every evening.     [name] amiodarone (PACERONE) 200 MG tablet Take 0.5 tablets (100 mg total) by mouth daily. 30 tablet 6  . amoxicillin (AMOXIL) 500 MG capsule TAKE 4 CAPSULES 1 HOUR PRIOR TO DENTAL PROCEDURES 4 capsule 0  . Ferrous Fumarate (HEMOCYTE - 106 MG FE) 324 (106 Fe) MG TABS tablet Take 1 tablet by mouth daily.    . furosemide (LASIX) 40 MG tablet TAKE  2 TABLETS BY MOUTH EVERY MORNING AND 1 TAB EVERY AFTERNOON 90 tablet 4  . metoprolol tartrate (LOPRESSOR) 25 MG tablet Take 25 mg by mouth daily.     . Multiple Vitamins-Minerals (MULTIVITAMINS THER. W/MINERALS) TABS Take 1 tablet by mouth every morning.     [name] omeprazole (PRILOSEC) 40 MG capsule TAKE ONE CAPSULE BY MOUTH EVERY DAY 30 capsule 8  . PROAIR HFA 108 (90 BASE) MCG/ACT inhaler Inhale 1-2 puffs into the lungs every 6 (six) hours as needed for wheezing.     . Sennosides (SENNA) 15 MG TABS Take 1 tablet by mouth every other day.    . sertraline (ZOLOFT) 25 MG tablet Take 50 mg by mouth every morning. Tapering off    . spironolactone (ALDACTONE) 25 MG tablet Take 1 tablet (25 mg total) by mouth daily. 30 tablet 5  . warfarin (COUMADIN) 2.5 MG tablet TAKE 1 TO 1.5 TABLETS BY MOUTH DAILY AS DIRECTED BY COUMADIN CLINIC 135 tablet 1   No current facility-administered medications for this visit.     Allergies as of 06/26/2016 - Review Complete 06/26/2016  Allergen Reaction Noted  . Ace inhibitors Cough 08/01/2008  . Lipitor [atorvastatin calcium] Other (See Comments) 04/07/2011  . Codeine Nausea And Vomiting 01/19/2014    Family History  Problem Relation Age of Onset  . Breast cancer Sister     multiple  . Uterine cancer Sister     multiple  . Heart disease Mother   . Heart attack Mother   . Heart disease Father   . Kidney disease Father   . Heart attack Father   . Heart disease Sister   . Crohn's disease Sister   . Heart disease Son   . Colon cancer Neg Hx   . Stomach cancer Neg Hx     Social History   Social History  . Marital status: Married    Spouse name: N/A  . Number of children: 4  . Years of education: N/A   Occupational History  . retired    Social History Main Topics  . Smoking status: Former Smoker    Types: Cigarettes    Quit date: 06/02/2001  . Smokeless tobacco: Never Used  . Alcohol use 0.0 oz/week     Comment: social-1 every 6 months  . Drug  use: No  . Sexual activity: Not on file   Other Topics Concern  . Not on file   Social History Narrative  . No narrative on file    Review of Systems:  Constitutional: Positive for weight loss chills and night sweats HEENT: Eyes: No change in vision               Ears, Nose, Throat:  No change in hearing  Skin: No rash Cardiovascular: No chest pain Respiratory: No SOB or cough Gastrointestinal: See HPI and otherwise negative Genitourinary: No dysuria or change in urinary frequency Neurological: No headache Musculoskeletal: No new muscle or joint pain Hematologic: No new bruising Psychiatric: Positive for anxiety   Physical Exam:  Vital signs: BP 116/60   Pulse 72   Ht 5' 2"  (1.575 m)   Wt 112 lb (50.8 kg)   BMI 20.49 kg/m   Constitutional: Caucasian female appears to be in NAD, Well developed, Well nourished, alert and cooperative Head:  Normocephalic and atraumatic. Eyes:   PEERL, EOMI. No icterus. Conjunctiva pink. Ears:  Normal auditory acuity. Neck:  Supple Throat: Oral cavity and pharynx without inflammation, swelling or lesion.  Respiratory: Respirations even and unlabored. Lungs clear to auscultation bilaterally.   No wheezes, crackles, or rhonchi.  Cardiovascular: Mechanical heart sounds. Regular rate and rhythm. No peripheral edema, cyanosis or pallor.  Gastrointestinal:  Soft, nondistended, moderate generalized abdominal tenderness worse in the epigastrium and bilateral lower quadrants No rebound or guarding. Normal bowel sounds. No appreciable masses or hepatomegaly. Rectal:  Not performed.  Msk:  Symmetrical without gross deformities. Without edema, no deformity or joint abnormality.  Neurologic:  Alert and  oriented x4;  grossly normal neurologically.  Skin:   Dry and intact without significant lesions or rashes. Psychiatric: Demonstrates good judgement and reason without abnormal affect or behaviors.  Requesting most recent blood work.  UPPER GI  SERIES WITH KUB 06/10/16  TECHNIQUE: After obtaining a scout radiograph a routine upper GI series was performed using thin and high density barium.  FLUOROSCOPY TIME:  Fluoroscopy Time:  1 minutes 42 seconds  Radiation Exposure Index (if provided by the fluoroscopic device): 190 mGy  Number of Acquired Spot Images: 0  COMPARISON:  CT abdomen of 09/01/2013  FINDINGS: A supine film of the abdomen shows no bowel obstruction. Surgical clips are noted in the right upper quadrant from prior cholecystectomy. Pacer and AICD leads are noted as well as cardiac valve replacements.  A double-contrast upper GI was performed. The mucosa of the esophagus is unremarkable. A single contrast study shows the swallowing mechanism to be normal. Esophageal peristalsis is relatively normal with only mild tertiary contractions in the mid and distal esophagus. No hiatal hernia is seen. No definite gastroesophageal reflux is demonstrated. However a barium pill was given at the end of the study which lodged above the gastroesophageal junction suggesting a short segment distal esophageal stricture.  The stomach is not optimally distended, with the patient having difficulty retaining air for adequate distention. However no gastric abnormality is seen. The duodenum ball fills well and the duodenal loop is in normal position.  IMPRESSION: 1. Barium pill lodges above the gastroesophageal junction suggesting a short segment distal esophageal stricture. No definite hiatal hernia or gastroesophageal reflux is seen. 2. Suboptimal distention of the stomach but no gastric abnormality. The duodenum is unremarkable. 3. Mild tertiary contractions in the distal esophagus.   Electronically Signed   By: Ivar Drape M.D.   On: 06/10/2016 09:14  Assessment: 1. Abdominal pain: She describes generalized abdominal pain, bilateral lower abdominal cramping as well as epigastric pain; consider relation to  IBS versus gastritis versus other 2. GERD: Uncontrolled on Omeprazole 40 mg daily; consider PUD  versus gastritis versus H. pylori versus other 3. Nausea and vomiting: She describes that this has been occurring daily over the past year, she is now losing weight down 13 pounds in the past few months, patient also describes episodes of black tarry stool over the past 3 months and vague history of newly diagnosed anemia we do not have labs; consider relation to thyroid medication versed gastritis versus H. pylori versus PUD versus other 4. Diarrhea: Patient describes 6-7 bowel movements per day until her thyroid medicine was changed about 3 weeks ago, now with urgent bowel movements 20-30 minutes after eating; consider relation to IBS versus other 5. Weight loss: With all of the above 6. Dysphagia: Patient describes foods and liquids getting stuck in her throat on the way down and some regurgitation, recent abnormal upper GI study as above showing stricture 7. Abnormal imaging of the abdomen 8. Melena: pt reports tarry sticky stool 2 times daily for 3 mos and newly diagnosed anemia, on coumadin; question PUD vs other  Plan: 1. Recommend patient proceed with EGD and colonoscopy further evaluation. Discussed risks, benefits, limitations and alternatives and the patient agrees to proceed. These were scheduled with Dr. Henrene Pastor in the [facility] 2. Recommended patient hold her Coumadin for 7 days prior to her procedures. We will communicate with her cardiologist Dr. Gwenlyn Found to ensure that holding her Coumadin is acceptable. 3. Increased patient's Omeprazole to 40 mg twice a day, 30-60 minutes before eating breakfast and dinner 4. Prescribed Zofran 8 mg ODT every 4-6 hours for nausea, did discuss that this can have a side effect of constipation which may be helpful for her 5. Requested patient's recent labs including CBC, CMP and iron studies recently done 2 weeks ago at the [facility]. 6. Patient to  return to clinic per Dr. Blanch Media recommendations after time of procedures.  Ellouise Newer, PA-C [facility] 06/26/2016, 10:47 AM  Cc: Leanna Battles, MD

## 2016-06-26 NOTE — Progress Notes (Signed)
Addendum:   Did receive recent labs, performed 06/11/16, CBC shows a white blood cell count minimally elevated at 11.2 and a normal hemoglobin at 12.5, ferritin was abnormally high at 548, other iron studes were normal, TSH was low at 0.247 and CMP showed a creatinine high at 1.5, alkaline phosphatase elevated at 127, AST normal at 31 and ALT normal at 17 other results.  No change to recommendations.  Ellouise Newer, PA-C

## 2016-06-26 NOTE — Telephone Encounter (Signed)
06/26/2016   RE: ELINDA BUNTEN DOB: 1940-09-10 MRN: [id number]   Dear Dr. Gwenlyn Found,    We have scheduled the above patient for an endoscopic procedure. Our records show that she is on anticoagulation therapy.   Please advise as to how long the patient may come off her therapy of Coumadin prior to the procedure, which is scheduled for 07-08-16.  Please fax back/ or route the completed form to [place], [place].   Sincerely,   Tinnie Gens, [place]

## 2016-06-26 NOTE — Telephone Encounter (Signed)
New message     *STAT* If patient is at the pharmacy, call can be transferred to refill team.   1. Which medications need to be refilled? (please list name of each medication and dose if known) amiodarone (PACERONE) 200 MG tablet / ondansetron (ZOFRAN ODT) 8 MG disintegrating tablet  2. Which pharmacy/location (including street and city if local pharmacy) is medication to be sent to?[facility] [phone]   3. Do they need a 30 day or 90 day supply? 30 days - showing interaction with both medication with QT intervals. Please advise.

## 2016-06-26 NOTE — Progress Notes (Signed)
Medically Complicated patient that I am familiar with. Have not seen in a few years. Multiple GI issues today as outlined. Agree with initial assessment and plans. Cardiology to assist with anticoagulation recommendations as the patient will need esophageal dilation

## 2016-06-26 NOTE — Patient Instructions (Addendum)
You have been scheduled for a colonoscopy. Please follow written instructions given to you at your visit today.  Please pick up your prep supplies at the pharmacy within the next 1-3 days. If you use inhalers (even only as needed), please bring them with you on the day of your procedure. Your physician has requested that you go to www.startemmi.com and enter the access code given to you at your visit today. This web site gives a general overview about your procedure. However, you should still follow specific instructions given to you by our office regarding your preparation for the procedure.  We have sent the following medications to your pharmacy for you to pick up at your convenience: Zofran 8 mg every 4-6 hrs as needed Increase Omeprazole 40 mg twice a day.

## 2016-06-27 ENCOUNTER — Encounter: Payer: Self-pay | Admitting: Internal Medicine

## 2016-06-27 NOTE — Telephone Encounter (Signed)
Spoke with patient.  Advised to avoid anti-nausea medications due to prolonged QT interval.  Recommended she try OTC Emetrol or Cola syrup.    Patient voiced understanding.

## 2016-06-27 NOTE — Telephone Encounter (Signed)
Will forward to pharm md to review 

## 2016-06-29 NOTE — Telephone Encounter (Signed)
OK to interrupt coumadin but with MVR will require a lovenox bridge

## 2016-06-30 ENCOUNTER — Other Ambulatory Visit: Payer: Self-pay | Admitting: Cardiovascular Disease

## 2016-06-30 NOTE — Telephone Encounter (Signed)
We will contact the patient and arrange the bridge.  She will need to come into the office to get instructions on when to stop warfarin and take lovenox.  She will hold warfarin for 5 days prior to procedure.  Lovenox dose will be based on her most recent renal function.

## 2016-06-30 NOTE — Telephone Encounter (Signed)
Appointment set for Wednesday at [facility]

## 2016-06-30 NOTE — Telephone Encounter (Signed)
Will need to see patient on Wednesday to co-ordinate bridge

## 2016-06-30 NOTE — Telephone Encounter (Signed)
Is it ok to hold for 5 days and what Lovenox dosage do you recommend?    Thanks,    Desiree, CMA

## 2016-07-01 ENCOUNTER — Ambulatory Visit (INDEPENDENT_AMBULATORY_CARE_PROVIDER_SITE_OTHER): Payer: Medicare Other | Admitting: Neurology

## 2016-07-01 ENCOUNTER — Encounter: Payer: Self-pay | Admitting: Neurology

## 2016-07-01 VITALS — BP 128/60 | HR 78 | Resp 14 | Ht 62.0 in | Wt 112.0 lb

## 2016-07-01 DIAGNOSIS — J410 Simple chronic bronchitis: Secondary | ICD-10-CM

## 2016-07-01 DIAGNOSIS — R400 Somnolence: Secondary | ICD-10-CM | POA: Diagnosis not present

## 2016-07-01 DIAGNOSIS — R519 Headache, unspecified: Secondary | ICD-10-CM

## 2016-07-01 DIAGNOSIS — Z95810 Presence of automatic (implantable) cardiac defibrillator: Secondary | ICD-10-CM | POA: Diagnosis not present

## 2016-07-01 DIAGNOSIS — R51 Headache: Secondary | ICD-10-CM

## 2016-07-01 DIAGNOSIS — G479 Sleep disorder, unspecified: Secondary | ICD-10-CM | POA: Diagnosis not present

## 2016-07-01 DIAGNOSIS — R351 Nocturia: Secondary | ICD-10-CM

## 2016-07-01 NOTE — Patient Instructions (Signed)
Based on your symptoms and your exam I believe we should look for an underlying organic sleep disorder, such as obstructive sleep apnea/OSA, or periodic leg movement disorder (PLMD), which is leg twitching in sleep. While this is typically associated with symptoms of restless leg syndrome, patients can have periodic leg movements, that is, repetitive twitching in their sleep secondary to medication effects, particularly from taking an SSRI type antidepressant. Sometimes changing the antidepressant regimen can help. If this leg movement disorder disrupts your sleep, it can cause other problems such as daytime sleepiness. Please do not drive if you feels sleepy. Sometimes other conditions can cause leg twitching at night including thyroid dysfunction and iron deficiency. There are some symptomatic medications available for treatment. At this point, we should proceed with a sleep study to further delineate your sleep-related issues If you have more than mild OSA, I want you to consider treatment with CPAP. Please remember, the risks and ramifications of moderate to severe obstructive sleep apnea or OSA are: Cardiovascular disease, including congestive heart failure, stroke, difficult to control hypertension, arrhythmias, and even type 2 diabetes has been linked to untreated OSA. Sleep apnea causes disruption of sleep and sleep deprivation in most cases, which, in turn, can cause recurrent headaches, problems with memory, mood, concentration, focus, and vigilance. Most people with untreated sleep apnea report excessive daytime sleepiness, which can affect their ability to drive. Please do not drive if you feel sleepy.   I will likely see you back after your sleep study to go over the test results and where to go from there. We will call you after your sleep study to advise about the results (most likely, you will hear from Beverlee Nims, my nurse) and to set up an appointment at the time, as necessary.    Our sleep lab  administrative assistant, Arrie Aran will meet with you or call you to schedule your sleep study. If you don't hear back from her by next week please feel free to call her at [phone]. This is her direct line and please leave a message with your phone number to call back if you get the voicemail box. She will call back as soon as possible.

## 2016-07-01 NOTE — Progress Notes (Signed)
Subjective:    Patient ID: [name] is a 76 y.o. female.  HPI     Star Age, MD, PhD [facility] [address] [address] [place], [place] [address]  Dear Dr. Philip Aspen,   I saw your patient, [name], upon your kind request in my neurologic clinic today for initial consultation of her sleep disorder, in particular, concern for underlying obstructive sleep apnea and chronic difficulty initiating and maintaining sleep. The patient is accompanied by her husband today. 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 Symptoms have been ongoing for many years, and she has tried prescription over-the-counter sleeping pills, does not recall the names but recalls name trying melatonin, p.m. type medications, Ambien and Lunesta without success. She has a strong family history of insomnia in her sisters. She has no family history of narcolepsy or obstructive sleep apnea. She is not known to snore on a regular basis and only when she is significantly congested with a cold. She has difficulty falling asleep but also staying asleep, she has to get up to use bathroom once per average night and has nearly daily morning headaches. Bedtime is typically between 9 and 10 that  she may not follow sleep until early morning hours. She then sleeps in or stays in bed until 11 AM or later at times. She has had difficulty swallowing and follows with GI. She is scheduled to have esophageal dilatation done. She needs surgery to her left shoulder, has joint pain, also history of fibromyalgia. She was on Lyrica in the distant past. She had side effects from it. She has significant issues with her teeth, needed several teeth to be pulled, has ongoing follow-up with her dentist for this. She is retired and lives with her husband. She has 4 grown children, 1 passed away from heart complications, she worked in [employer] for 28 years. I reviewed your office note from 06/05/2016, which you kindly included.  Her Past Medical History Is Significant For: Past Medical History:  Diagnosis Date  . Anemia   . Anxiety   . Arthritis   . Asthma   . 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 Protecta  . Biventricular ICD (implantable cardioverter-defibrillator) in place    with CRT  . Blood transfusion without reported diagnosis   . Bursitis   . Cataract    RIGHT EYE  . CHF (congestive heart failure) ([place])   . Chronic kidney disease   . Colon polyp    adenomatous  . Complication of anesthesia    patient stated that had difficulty getting the breathing tube removed, patient said that she stopped breathing and HR dropped to 10  patient then woke up and started breathing pateint stated no longer than one minute; re-intubated in PACU following cholecystectomy 10/28/13  . COPD (chronic obstructive pulmonary disease) ([place])   . Diverticulosis   . Dysrhythmia   . Fibromyalgia   . GERD (gastroesophageal reflux disease)   .  Gout   . H/O mitral valve replacement 2002, 2007  . Heart murmur   . Hemorrhoids   . Hyperlipidemia   . Hypothyroidism   . IBS (irritable bowel syndrome)   . MVP (mitral valve prolapse)   . Pacemaker   . Peptic ulcer disease     Her Past  Surgical History Is Significant For: Past Surgical History:  Procedure Laterality Date  . ABDOMINAL HYSTERECTOMY    . CARDIAC CATHETERIZATION  02/25/2006   normal left main, normal LAD, normal L Cfx, normal/dominant RCA (Dr. [name])  . CARDIAC DEFIBRILLATOR PLACEMENT  2007, 11/202012   x2 (pacemaker) (Dr. Jerilynn Mages. [place])  . CARDIAC VALVE REPLACEMENT  2002   MV repair - Dr. Berle Mull  . CHOLECYSTECTOMY N/A 10/28/2013   Procedure: LAPAROSCOPIC CHOLECYSTECTOMY WITH INTRAOPERATIVE CHOLANGIOGRAM;  Surgeon: Edward Jolly, MD;  Location: [facility];  Service: General;  Laterality: N/A;  . COLONOSCOPY    . EYE SURGERY    . IMPLANTABLE CARDIOVERTER DEFIBRILLATOR (ICD) GENERATOR CHANGE N/A 04/10/2011   Procedure: ICD GENERATOR CHANGE;  Surgeon: Sanda Klein, MD;  Location: [facility] CATH LAB;  Service: Cardiovascular;  Laterality: N/A;  . KNEE ARTHROSCOPY    . MITRAL VALVE REPLACEMENT  02/26/2006   re-do MVR w/82m St. [name] (Dr. BEllison Hughs  . NM MYOCAR PERF WALL MOTION  2005   persantine myoview - low ris, EF 63%  . RIGHT HEART CATH  04/03/2006   pulm cap wedge pressure 24/24, PA pressure 43/22 (mean 338mg), CO 4.8, CI 4.1 (Dr. J.Jackie Plum . TOTAL SHOULDER ARTHROPLASTY Right 12/27/2015   Procedure: RIGHT TOTAL SHOULDER ARTHROPLASTY;  Surgeon: JuTania AdeMD;  [place] [facility] Service: Orthopedics;  Laterality: Right;  Right total shoulder arthroplasty  . TRANSTHORACIC ECHOCARDIOGRAM  12/2011   EF 50-55%, mild global hypokinesis; LA severely dilated; calcification of anterior/posterior MV leaflets, bi-leaflets St. [name] mechanical MV; mild TR; trace AV regurg/pulm valve regurg    Her Family History Is Significant For: Family History  Problem Relation Age of Onset  . Breast cancer Sister     multiple  . Uterine cancer Sister     multiple  . Heart disease Mother   . Heart attack Mother   . Heart disease Father   . Kidney disease Father   . Heart attack Father   . Heart disease Sister   .  Crohn's disease Sister   . Heart disease Son   . Colon cancer Neg Hx   . Stomach cancer Neg Hx     Her Social History Is Significant For: Social History   Social History  . Marital status: Married    Spouse name: N/A  . Number of children: 4  . Years of education: 12   Occupational History  . retired    Social History Main Topics  . Smoking status: Former Smoker    Types: Cigarettes    Quit date: 06/02/2001  . Smokeless tobacco: Never Used  . Alcohol use 0.0 oz/week     Comment: social-1 every 6 months  . Drug use: No  . Sexual activity: Not Asked   Other Topics Concern  . None   Social History Narrative   Drinks little caffeine     Her Allergies Are:  Allergies  Allergen Reactions  . Ace Inhibitors Cough  . Lipitor [Atorvastatin Calcium] Other (See Comments)    Knee pain.  . Codeine Nausea And Vomiting  :   Her Current Medications Are:  Outpatient Encounter Prescriptions as  of 07/01/2016  Medication Sig  . allopurinol (ZYLOPRIM) 300 MG tablet Take 300 mg by mouth every evening.   [name] amiodarone (PACERONE) 200 MG tablet Take 0.5 tablets (100 mg total) by mouth daily.  [name] amoxicillin (AMOXIL) 500 MG capsule TAKE 4 CAPSULES 1 HOUR PRIOR TO DENTAL PROCEDURES  . Ferrous Fumarate (HEMOCYTE - 106 MG FE) 324 (106 Fe) MG TABS tablet Take 1 tablet by mouth daily.  . furosemide (LASIX) 40 MG tablet TAKE 2 TABLETS BY MOUTH EVERY MORNING AND 1 TAB EVERY AFTERNOON  . metoprolol tartrate (LOPRESSOR) 25 MG tablet Take 25 mg by mouth daily.   . Multiple Vitamins-Minerals (MULTIVITAMINS THER. W/MINERALS) TABS Take 1 tablet by mouth every morning.   . Na Sulfate-K Sulfate-Mg Sulf 17.5-3.13-1.6 GM/180ML SOLN Take 1 kit by mouth as directed.  [name] omeprazole (PRILOSEC) 40 MG capsule Take 1 capsule (40 mg total) by mouth daily. 30-60 mins before breakfast  . PROAIR HFA 108 (90 BASE) MCG/ACT inhaler Inhale 1-2 puffs into the lungs every 6 (six) hours as needed for wheezing.   . Sennosides  (SENNA) 15 MG TABS Take 1 tablet by mouth every other day.  . spironolactone (ALDACTONE) 25 MG tablet Take 1 tablet (25 mg total) by mouth daily.  [name] SYNTHROID 50 MCG tablet Take 50 mcg by mouth daily.  [name] warfarin (COUMADIN) 2.5 MG tablet TAKE 1 TO 1.5 TABLETS BY MOUTH DAILY AS DIRECTED BY COUMADIN CLINIC  . [DISCONTINUED] sertraline (ZOLOFT) 25 MG tablet Take 50 mg by mouth every morning. Tapering off  . [DISCONTINUED] ondansetron (ZOFRAN ODT) 8 MG disintegrating tablet Take 1 tablet (8 mg total) by mouth every 6 (six) hours as needed for nausea or vomiting.   No facility-administered encounter medications on file as of 07/01/2016.   :  Review of Systems:  Out of a complete 14 point review of systems, all are reviewed and negative with the exception of these symptoms as listed below: Review of Systems  Neurological:       Patient has trouble falling and staying asleep, wakes up at night to go to the bathroom, wakes up feeling tired, daytime fatigue, morning headaches, may take a nap during the day.  Constant low energy level, easily gets fatigued, history of heart problems.    Epworth Sleepiness Scale 0= would never doze 1= slight chance of dozing 2= moderate chance of dozing 3= high chance of dozing  Sitting and reading:3 Watching TV:2 Sitting inactive in a public place (ex. Theater or meeting):3 As a passenger in a car for an hour without a break:3 Lying down to rest in the afternoon:2 Sitting and talking to someone:3 Sitting quietly after lunch (no alcohol):3 In a car, while stopped in traffic:3 Total:22  Objective:  Neurologic Exam  Physical Exam Physical Examination:   Vitals:   07/01/16 1114  BP: 128/60  Pulse: 78  Resp: 14   General Examination: The patient is a very pleasant 76 y.o. female in no acute distress. She appears thin and frail, well groomed.   HEENT: Normocephalic, atraumatic, pupils are equal, round and reactive to light and accommodation. Funduscopic  exam is normal with sharp disc margins noted. Extraocular tracking is good without limitation to gaze excursion or nystagmus noted. Normal smooth pursuit is noted. Hearing is grossly intact. Status post left cataract repair, evidence of right cataract. Face is symmetric with normal facial animation and normal facial sensation. Speech is clear with no dysarthria noted. There is no hypophonia. There is no lip, neck/head, jaw  or voice tremor. Neck is supple with full range of passive and active motion. There are no carotid bruits on auscultation. Oropharynx exam reveals: mild mouth dryness, marginal dental hygiene and mild airway crowding, due tosmaller airway entry and redundant soft palate, otherwise very small or absent tonsils, she denies tonsillectomy. Mallampati is class I. Tongue protrudes centrally and palate elevates symmetrically. Neck size is 15 5/8 inches. She has a Mild overbite.    Chest: Clear to auscultation without wheezing, rhonchi or crackles noted.  Heart: S1+S2+0, regular and normal without murmurs, rubs or gallops noted.   Abdomen: Soft, non-tender and non-distended with normal bowel sounds appreciated on auscultation.  Extremities: There is no pitting edema in the distal lower extremities bilaterally. Pedal pulses are intact.  Skin: Warm and dry without trophic changes noted.   Musculoskeletal: exam reveals no obvious joint deformities, tenderness or joint swelling or erythema. Good ROM R shoulder, significant decrease in ROM L shoulder.   Neurologically:  Mental status: The patient is awake, alert and oriented in all 4 spheres. Her immediate and remote memory, attention, language skills and fund of knowledge are appropriate. There is no evidence of aphasia, agnosia, apraxia or anomia. Speech is clear with normal prosody and enunciation. Thought process is linear. Mood is normal and affect is normal.  Cranial nerves II - XII are as described above under HEENT exam. In addition:  shoulder shrug is normal with equal shoulder height noted. Motor exam: Normal bulk, strength and tone is noted. There is no drift, tremor or rebound. Romberg is negative. Reflexes are 2+ throughout. Fine motor skills and coordination: intact with normal finger taps, normal hand movements, normal rapid alternating patting, normal foot taps and normal foot agility.  Cerebellar testing: No dysmetria or intention tremor on finger to nose testing. Heel to shin is unremarkable bilaterally. There is no truncal or gait ataxia.  Sensory exam: intact to light touch, pinprick, vibration, temperature sense in the upper and lower extremities.  Gait, station and balance: She stands easily. No veering to one side is noted. No leaning to one side is noted. Posture is age-appropriate and stance is narrow based. Gait shows normal stride length and normal pace. No problems turning are noted. Tandem walk is  difficult for her.   Assessment and Plan:  In summary, HEIDI MACLIN is a very pleasant 76 y.o.-year old female with an underlying medical history of hypothyroidism, weight loss with low weight, left bundle branch block, diverticulosis, esophageal stricture, colonic polyps, reflux disease, COPD, hyperlipidemia, chronic kidney disease, knee pain secondary to osteoarthritis, sudden cardiac death in 08/09/2006 with status post pacemaker and defibrillator placement in Aug 09, 2006, multiple other surgeries including hysterectomy, surgery, cataract removal, mitral valve annuloplasty, left knee surgery, mitral valve replacement, cholecystectomy, right total shoulder replacement, who reports difficulty falling asleep, difficulty staying asleep, morning headaches, daytime tiredness, nocturia and severe daytime somnolence. I suggested we proceed with sleep study evaluation to further delineate her underlying sleep disorder, particularly to exclude an organic sleep disorder such as obstructive sleep apnea. Given the fact that she has a history of  COPD she is at additional risk for desaturations during sleep. I had a long chat with the patient and her husband about my findings and the diagnosis of OSA, its prognosis and treatment options. We talked about medical treatments, surgical interventions and non-pharmacological approaches. I explained in particular the risks and ramifications of untreated moderate to severe OSA, especially with respect to developing cardiovascular disease down the Road, including congestive  heart failure, difficult to treat hypertension, cardiac arrhythmias, or stroke. Even type 2 diabetes has, in part, been linked to untreated OSA. Symptoms of untreated OSA include daytime sleepiness, memory problems, mood irritability and mood disorder such as depression and anxiety, lack of energy, as well as recurrent headaches, especially morning headaches. We talked about trying to maintain a healthy lifestyle in general, as well as the importance of weight control. I encouraged the patient to eat healthy, exercise daily and keep well hydrated, to keep a scheduled bedtime and wake time routine, to not skip any meals and eat healthy snacks in between meals. I advised the patient not to drive when feeling sleepy. I recommended the following at this time: sleep study with potential positive airway pressure titration. (We will score hypopneas at 4% and split the sleep study into diagnostic and treatment portion, if the estimated. 2 hour AHI is >20/h).   I explained the sleep test procedure to the patient and also outlined possible surgical and non-surgical treatment options of OSA, including the use of a custom-made dental device (which would require a referral to a specialist dentist or oral surgeon), upper airway surgical options, such as pillar implants, radiofrequency surgery, tongue base surgery, and UPPP (which would involve a referral to an ENT surgeon). Rarely, jaw surgery such as mandibular advancement may be considered.  I also  explained the CPAP treatment option to the patient, who indicated that she would be willing to try CPAP if the need arises. I explained the importance of being compliant with PAP treatment, not only for insurance purposes but primarily to improve Her symptoms, and for the patient's long term health benefit, including to reduce Her cardiovascular risks. I answered all their questions today and the patient and her husband were in agreement. I would like to see her back after the sleep study is completed and encouraged her to call with any interim questions, concerns, problems or updates.   Thank you very much for allowing me to participate in the care of this nice patient. If I can be of any further assistance to you please do not hesitate to call me at [phone].  Sincerely,   Star Age, MD, PhD

## 2016-07-02 ENCOUNTER — Ambulatory Visit (INDEPENDENT_AMBULATORY_CARE_PROVIDER_SITE_OTHER): Payer: Medicare Other | Admitting: Pharmacist Clinician (PhC)/ Clinical Pharmacy Specialist

## 2016-07-02 ENCOUNTER — Telehealth: Payer: Self-pay | Admitting: Cardiovascular Disease

## 2016-07-02 ENCOUNTER — Other Ambulatory Visit: Payer: Self-pay | Admitting: Pharmacist Clinician (PhC)/ Clinical Pharmacy Specialist

## 2016-07-02 DIAGNOSIS — Z952 Presence of prosthetic heart valve: Secondary | ICD-10-CM

## 2016-07-02 DIAGNOSIS — Z7901 Long term (current) use of anticoagulants: Secondary | ICD-10-CM | POA: Diagnosis not present

## 2016-07-02 LAB — POCT INR: INR: 2.9

## 2016-07-02 MED ORDER — ENOXAPARIN SODIUM 60 MG/0.6ML ~~LOC~~ SOLN: 60.0000 mg | Freq: Two times a day (BID) | SUBCUTANEOUS | 0 refills | Status: DC

## 2016-07-02 MED ORDER — AMOXICILLIN 500 MG PO CAPS: ORAL_CAPSULE | ORAL | 2 refills | Status: DC

## 2016-07-02 NOTE — Telephone Encounter (Signed)
This was sent to pharmacy (sent by [facility]). Called pt, no answer when dialed. LM advising refill sent, to call if further needs.

## 2016-07-02 NOTE — Patient Instructions (Addendum)
Enoxaprin Dosing Schedule  Enoxparin dose:  60 mg  Date  Warfarin Dose (evenings) Enoxaprin Dose  1-31 6 0   2-1 5 0            9 pm  2-2 4 0 9 am   9 pm  2-3 3 0 9 am   9 pm  2-4 2 0 9 am   9 pm  2-5 1 0 9 am  2-6 Procedure 2.5 mg (1 tab)   2-7 1 3.75 mg (1.5 tabs) 9 am   9 pm  2-8 2 3.75 mg (1.5 tabs) 9 am   9 pm  2-9 3 3.75 mg (1.5 tabs) 9 am   9 pm  2-10 4 3.75 mg (1.5 tabs) 9 am   9 pm  2-11 5 2.5 mg (1 tab) 9 am   9 pm  2-12 6 Repeat INR

## 2016-07-02 NOTE — Telephone Encounter (Signed)
New message      Pt states that she is having 4 teeth pulled and 2 filled tomorrow.  Please call in amoxicillin to [facility] at [address].  Call pt if there is a problem

## 2016-07-08 ENCOUNTER — Encounter: Payer: Self-pay | Admitting: Internal Medicine

## 2016-07-08 ENCOUNTER — Ambulatory Visit (AMBULATORY_SURGERY_CENTER): Payer: Medicare Other | Admitting: Internal Medicine

## 2016-07-08 VITALS — BP 142/61 | HR 63 | Temp 97.3°F | Resp 13 | Ht 62.0 in | Wt 112.0 lb

## 2016-07-08 DIAGNOSIS — K222 Esophageal obstruction: Secondary | ICD-10-CM

## 2016-07-08 DIAGNOSIS — R1310 Dysphagia, unspecified: Secondary | ICD-10-CM | POA: Diagnosis present

## 2016-07-08 DIAGNOSIS — R197 Diarrhea, unspecified: Secondary | ICD-10-CM

## 2016-07-08 DIAGNOSIS — K921 Melena: Secondary | ICD-10-CM | POA: Diagnosis not present

## 2016-07-08 MED ORDER — SODIUM CHLORIDE 0.9 % IV SOLN: 500.0000 mL | INTRAVENOUS | Status: DC

## 2016-07-08 NOTE — Progress Notes (Signed)
Pt. Has large bruised noted to right jaw that extends down to neck,she stated from dental work.  Pt's states no medical or surgical changes since previsit or office visit.

## 2016-07-08 NOTE — Patient Instructions (Signed)
YOU HAD AN ENDOSCOPIC PROCEDURE TODAY AT [facility]:   Refer to the procedure report that was given to you for any specific questions about what was found during the examination.  If the procedure report does not answer your questions, please call your gastroenterologist to clarify.  If you requested that your care partner not be given the details of your procedure findings, then the procedure report has been included in a sealed envelope for you to review at your convenience later.  YOU SHOULD EXPECT: Some feelings of bloating in the abdomen. Passage of more gas than usual.  Walking can help get rid of the air that was put into your GI tract during the procedure and reduce the bloating. If you had a lower endoscopy (such as a colonoscopy or flexible sigmoidoscopy) you may notice spotting of blood in your stool or on the toilet paper. If you underwent a bowel prep for your procedure, you may not have a normal bowel movement for a few days.  Please Note:  You might notice some irritation and congestion in your nose or some drainage.  This is from the oxygen used during your procedure.  There is no need for concern and it should clear up in a day or so.  SYMPTOMS TO REPORT IMMEDIATELY:   Following lower endoscopy (colonoscopy or flexible sigmoidoscopy):  Excessive amounts of blood in the stool  Significant tenderness or worsening of abdominal pains  Swelling of the abdomen that is new, acute  Fever of 100F or higher   Following upper endoscopy (EGD)  Vomiting of blood or coffee ground material  New chest pain or pain under the shoulder blades  Painful or persistently difficult swallowing  New shortness of breath  Fever of 100F or higher  Black, tarry-looking stools  For urgent or emergent issues, a gastroenterologist can be reached at any hour by calling [phone].   DIET:  We do recommend a small meal at first, but then you may proceed to your regular diet.  Drink  plenty of fluids but you should avoid alcoholic beverages for 24 hours.  ACTIVITY:  You should plan to take it easy for the rest of today and you should NOT DRIVE or use heavy machinery until tomorrow (because of the sedation medicines used during the test).    FOLLOW UP: Our staff will call the number listed on your records the next business day following your procedure to check on you and address any questions or concerns that you may have regarding the information given to you following your procedure. If we do not reach you, we will leave a message.  However, if you are feeling well and you are not experiencing any problems, there is no need to return our call.  We will assume that you have returned to your regular daily activities without incident.  If any biopsies were taken you will be contacted by phone or by letter within the next 1-3 weeks.  Please call us at [phone] if you have not heard about the biopsies in 3 weeks.    SIGNATURES/CONFIDENTIALITY: You and/or your care partner have signed paperwork which will be entered into your electronic medical record.  These signatures attest to the fact that that the information above on your After Visit Summary has been reviewed and is understood.  Full responsibility of the confidentiality of this discharge information lies with you and/or your care-partner.  Diverticulosis, high fiber diet, and after dilation diet instructions and information  given.  Resume Lovenox and coumadin today.  Follow instructions of your Coumadin clinic.

## 2016-07-08 NOTE — Progress Notes (Signed)
To PACU Pt awake and alert. Report to RN

## 2016-07-08 NOTE — Op Note (Signed)
[place] Patient Name: [name] Procedure Date: 07/08/2016 12:39 PM MRN: [id number] Endoscopist: Docia Chuck. Henrene Pastor , MD Age: 76 Referring MD:  Date of Birth: 09/02/1940 Gender: Female Account #: [id number] Procedure:                Upper GI endoscopy, with [facility] dilation of the                            esophagus Indications:              Dysphagia. Abnormal esophagram. 'dark stools" Medicines:                Monitored Anesthesia Care Procedure:                Pre-Anesthesia Assessment:                           - Prior to the procedure, a History and Physical                            was performed, and patient medications and                            allergies were reviewed. The patient's tolerance of                            previous anesthesia was also reviewed. The risks                            and benefits of the procedure and the sedation                            options and risks were discussed with the patient.                            All questions were answered, and informed consent                            was obtained. Prior Anticoagulants: The patient has                            taken Coumadin (warfarin), last dose was 7 days                            prior to procedure. ASA Grade Assessment: III - A                            patient with severe systemic disease. After                            reviewing the risks and benefits, the patient was                            deemed in satisfactory condition to undergo the  procedure.                           After obtaining informed consent, the endoscope was                            passed under direct vision. Throughout the                            procedure, the patient's blood pressure, pulse, and                            oxygen saturations were monitored continuously. The                            Model GIF-HQ190 [id number]) scope was introduced                             through the mouth, and advanced to the second part                            of duodenum. The upper GI endoscopy was                            accomplished without difficulty. The patient                            tolerated the procedure well. Scope In: Scope Out: Findings:                 One mild benign-appearing, intrinsic stenosis was                            found. This measured 1.5 cm (inner diameter). The                            scope was withdrawn. Dilation was performed with a                            Maloney dilator with no resistance at 32 Fr.                           The exam of the esophagus was otherwise normal.                           The stomach was normal.                           The examined duodenum was normal.                           The cardia and gastric fundus were normal on                            retroflexion. Complications:  No immediate complications. Estimated Blood Loss:     Estimated blood loss: none. Impression:               - Benign-appearing esophageal stenosis. Dilated.                           - Normal stomach.                           - Normal examined duodenum.                           - No specimens collected. Recommendation:           - Patient has a contact number available for                            emergencies. The signs and symptoms of potential                            delayed complications were discussed with the                            patient. Return to normal activities tomorrow.                            Written discharge instructions were provided to the                            patient.                           - Post dilation diet.                           - Resuming Coumadin and Lovenox today. Follow the                            instructions from your Coumadin clinic                           - Continue regular medications. Docia Chuck. Henrene Pastor, MD 07/08/2016 2:27:32  PM This report has been signed electronically.

## 2016-07-08 NOTE — Progress Notes (Signed)
Called to room to assist during endoscopic procedure.  Patient ID and intended procedure confirmed with present staff. Received instructions for my participation in the procedure from the performing physician.  

## 2016-07-08 NOTE — Op Note (Signed)
[place] Patient Name: [name] Procedure Date: 07/08/2016 12:38 PM MRN: [id number] Endoscopist: Docia Chuck. Henrene Pastor , MD Age: 76 Referring MD:  Date of Birth: 1940-12-22 Gender: Female Account #: [id number] Procedure:                Colonoscopy, with biopsies Indications:              Melena (dark stools). Normal hemoglobin. No iron                            deficiency. Prior colonoscopies for adenomatous                            polyps 2002, 2005, 2010, and 2015 Medicines:                Monitored Anesthesia Care Procedure:                Pre-Anesthesia Assessment:                           - Prior to the procedure, a History and Physical                            was performed, and patient medications and                            allergies were reviewed. The patient's tolerance of                            previous anesthesia was also reviewed. The risks                            and benefits of the procedure and the sedation                            options and risks were discussed with the patient.                            All questions were answered, and informed consent                            was obtained. Prior Anticoagulants: The patient has                            taken Coumadin (warfarin), last dose was 7 days                            prior to procedure. ASA Grade Assessment: III - A                            patient with severe systemic disease. After                            reviewing the risks and benefits, the patient was  deemed in satisfactory condition to undergo the                            procedure.                           After obtaining informed consent, the colonoscope                            was passed under direct vision. Throughout the                            procedure, the patient's blood pressure, pulse, and                            oxygen saturations were monitored continuously. The                             Model CF-HQ190L [id number]) scope was introduced                            through the anus and advanced to the the cecum,                            identified by appendiceal orifice and ileocecal                            valve. The ileocecal valve, appendiceal orifice,                            and rectum were photographed. The quality of the                            bowel preparation was excellent. The colonoscopy                            was performed without difficulty. The patient                            tolerated the procedure well. The bowel preparation                            used was SUPREP. Scope In: 1:58:41 PM Scope Out: 2:11:50 PM Scope Withdrawal Time: 0 hours 8 minutes 35 seconds  Total Procedure Duration: 0 hours 13 minutes 9 seconds  Findings:                 Multiple diverticula were found in the sigmoid                            colon.                           The entire examined colon appeared normal on direct  and retroflexion views (possibly mild melanosis).                            Biopsies for histology were taken with a cold                            forceps from the entire colon for evaluation of                            microscopic colitis. Complications:            No immediate complications. Estimated blood loss:                            None. Estimated Blood Loss:     Estimated blood loss: none. Impression:               - Diverticulosis in the sigmoid colon.                           - The entire examined colon is normal on direct and                            retroflexion views (possibly mild melanosis). Recommendation:           - Repeat colonoscopy is not recommended for                            surveillance.                           - Patient has a contact number available for                            emergencies. The signs and symptoms of potential                             delayed complications were discussed with the                            patient. Return to normal activities tomorrow.                            Written discharge instructions were provided to the                            patient.                           - Resume Lovenox and Coumadin today. Follow up with                            your Coumadin clinic as you have been directed.                           - EGD today. Please see report for results.                           -  Await pathology results. Dr. Henrene Pastor will send you                            a letter with the results and any further                            recommendations as necessary. Docia Chuck. Henrene Pastor, MD 07/08/2016 2:18:02 PM This report has been signed electronically.

## 2016-07-09 ENCOUNTER — Telehealth: Payer: Self-pay

## 2016-07-09 ENCOUNTER — Ambulatory Visit (INDEPENDENT_AMBULATORY_CARE_PROVIDER_SITE_OTHER): Payer: Medicare Other | Admitting: Cardiovascular Disease

## 2016-07-09 ENCOUNTER — Encounter: Payer: Self-pay | Admitting: Cardiovascular Disease

## 2016-07-09 VITALS — BP 148/64 | HR 79 | Ht 62.0 in | Wt 111.7 lb

## 2016-07-09 DIAGNOSIS — Z95810 Presence of automatic (implantable) cardiac defibrillator: Secondary | ICD-10-CM

## 2016-07-09 DIAGNOSIS — I480 Paroxysmal atrial fibrillation: Secondary | ICD-10-CM | POA: Diagnosis not present

## 2016-07-09 NOTE — Assessment & Plan Note (Signed)
History of PAF on low-dose amiodarone and maintaining sinus rhythm.

## 2016-07-09 NOTE — Assessment & Plan Note (Signed)
History of biventricular ICD implanted followed by Dr. Sallyanne Kuster. Ultimately her ejection fraction improved from 10% up to normal. Her most recent echo showed EF of 50%. She has no symptoms of heart failure.

## 2016-07-09 NOTE — Patient Instructions (Signed)
Medication Instructions: Your physician recommends that you continue on your current medications as directed. Please refer to the Current Medication list given to you today.   Labwork: I will request labs from Dr. Philip Aspen.  Testing/Procedures: Your physician has requested that you have an echocardiogram in 1 year. Echocardiography is a painless test that uses sound waves to create images of your heart. It provides your doctor with information about the size and shape of your heart and how well your heart's chambers and valves are working. This procedure takes approximately one hour. There are no restrictions for this procedure.  Follow-Up: Your physician wants you to follow-up in: 1 year after your echo is completed. You will receive a reminder letter in the mail two months in advance. If you don't receive a letter, please call our office to schedule the follow-up appointment.  If you need a refill on your cardiac medications before your next appointment, please call your pharmacy.

## 2016-07-09 NOTE — Progress Notes (Signed)
07/09/2016 ABRAR KOONE   [birth year]  [id number]  Primary Physician [name], MD Primary Cardiologist: Lorretta Harp MD Lupe Carney, [place]  HPI:    The patient is a 76 year old, thin appearing, married, Caucasian female, mother of three (one deceased child), grandmother to eight grandchildren, who I last saw in the office 08/04/14. Her husband is also a patient of mine. He had stage IV lung cancer, which responded to chemotherapy. 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 Berle Mull in 2002 with redo with a St. [name] MVR performed by Dr. Gilford Raid in 2007. She had normal coronary arteries. Her EF improved initially from 10% to normal with cardiac resynchronization therapy. Dr. Sallyanne Kuster follows this. She has undergone a generator change by Dr. Sallyanne Kuster on April 22, 2011, and she is followed by [facility].  Recent 2-D echocardiogram performed 02/07/16 revealed normal ejection fraction with a normal functioning mitral mechanical prosthesis. She denies chest pain or shortness of breath.   Current Outpatient Prescriptions  Medication Sig Dispense Refill  . allopurinol (ZYLOPRIM) 300 MG tablet Take 300 mg by mouth every evening.     [name] amiodarone (PACERONE) 200 MG tablet Take 0.5 tablets (100 mg total) by mouth daily. 30 tablet 6  . amoxicillin (AMOXIL) 500 MG capsule Take 4 capsules 1 hour prior to dental appointment. 4 capsule 2  . enoxaparin (LOVENOX) 60 MG/0.6ML injection Inject 0.6 mLs (60 mg total) into the skin every 12 (twelve) hours. As directed 20 Syringe 0  . Ferrous Fumarate (HEMOCYTE - 106 MG FE) 324 (106 Fe) MG TABS tablet Take 1 tablet by mouth daily.    . furosemide (LASIX) 40 MG tablet TAKE 2 TABLETS BY MOUTH EVERY MORNING AND 1 TAB EVERY AFTERNOON 90 tablet 4  . metoprolol tartrate (LOPRESSOR) 25 MG tablet Take 25 mg by mouth daily.     . Multiple Vitamins-Minerals (MULTIVITAMINS THER. W/MINERALS) TABS  Take 1 tablet by mouth every morning.     [name] omeprazole (PRILOSEC) 40 MG capsule Take 1 capsule (40 mg total) by mouth daily. 30-60 mins before breakfast 60 capsule 2  . PROAIR HFA 108 (90 BASE) MCG/ACT inhaler Inhale 1-2 puffs into the lungs every 6 (six) hours as needed for wheezing.     . Sennosides (SENNA) 15 MG TABS Take 1 tablet by mouth every other day.    . spironolactone (ALDACTONE) 25 MG tablet Take 1 tablet (25 mg total) by mouth daily. 30 tablet 5  . SYNTHROID 50 MCG tablet Take 50 mcg by mouth daily.  2  . warfarin (COUMADIN) 2.5 MG tablet TAKE 1 TO 1.5 TABLETS BY MOUTH DAILY AS DIRECTED BY COUMADIN CLINIC 135 tablet 1   Current Facility-Administered Medications  Medication Dose Route Frequency Provider Last Rate Last Dose  . 0.9 %  sodium chloride infusion  500 mL Intravenous Continuous Irene Shipper, MD        Allergies  Allergen Reactions  . Ace Inhibitors Cough  . Lipitor [Atorvastatin Calcium] Other (See Comments)    Knee pain.  . Codeine Nausea And Vomiting    Social History   Social History  . Marital status: Married    Spouse name: N/A  . Number of children: 4  . Years of education: 12   Occupational History  . retired    Social History Main Topics  . Smoking status: Former Smoker    Types: Cigarettes    Quit date:  06/02/2001  . Smokeless tobacco: Never Used  . Alcohol use 0.0 oz/week     Comment: social-1 every 6 months  . Drug use: No  . Sexual activity: Not on file   Other Topics Concern  . Not on file   Social History Narrative   Drinks little caffeine      Review of Systems: General: negative for chills, fever, night sweats or weight changes.  Cardiovascular: negative for chest pain, dyspnea on exertion, edema, orthopnea, palpitations, paroxysmal nocturnal dyspnea or shortness of breath Dermatological: negative for rash Respiratory: negative for cough or wheezing Urologic: negative for hematuria Abdominal: negative for nausea, vomiting,  diarrhea, bright red blood per rectum, melena, or hematemesis Neurologic: negative for visual changes, syncope, or dizziness All other systems reviewed and are otherwise negative except as noted above.    Blood pressure (!) 148/64, pulse 79, height 5' 2"  (1.575 m), weight 111 lb 11.2 oz (50.7 kg).  General appearance: alert and no distress Neck: no adenopathy, no carotid bruit, no JVD, supple, symmetrical, trachea midline and thyroid not enlarged, symmetric, no tenderness/mass/nodules Lungs: clear to auscultation bilaterally Heart: Crisp prosthetic valve sounds Extremities: extremities normal, atraumatic, no cyanosis or edema  EKG ventricular paced rhythm at 79 with inferolateral T-wave inversion. I personally reviewed this EKG.  ASSESSMENT AND PLAN:   History of mitral valve replacement with mechanical valve History of St. [name] MVR initially 2002 by Dr. Amador Cunas with redo in 2007 by Dr. Cyndia Bent . Recent 2-D echo showed normal bowel function. She is on Coumadin anticoagulation.  HYPERLIPIDEMIA History of hyperlipidemia not on statin therapy followed by her PCP  Biventricular ICD (implantable cardioverter-defibrillator) in place History of biventricular ICD implanted followed by Dr. Sallyanne Kuster. Ultimately her ejection fraction improved from 10% up to normal. Her most recent echo showed EF of 50%. She has no symptoms of heart failure.  Paroxysmal atrial fibrillation (HCC) History of PAF on low-dose amiodarone and maintaining sinus rhythm.      Lorretta Harp MD FACP,FACC,FAHA, [facility] 07/09/2016 10:10 AM

## 2016-07-09 NOTE — Telephone Encounter (Signed)
  Follow up Call-  Call back number 07/08/2016 04/10/2014  Post procedure Call Back phone  # [phone]  Permission to leave phone message Yes Yes  Some recent data might be hidden     Patient questions:  Do you have a fever, pain , or abdominal swelling? No. Pain Score  0 *  Have you tolerated food without any problems? Yes.    Have you been able to return to your normal activities? Yes.    Do you have any questions about your discharge instructions: Diet   No. Medications  No. Follow up visit  No.  Do you have questions or concerns about your Care? No.  Actions: * If pain score is 4 or above: No action needed, pain <4.  No problems per pt. maw

## 2016-07-09 NOTE — Assessment & Plan Note (Signed)
History of St. [name] MVR initially 2002 by Dr. Amador Cunas with redo in 2007 by Dr. Cyndia Bent . Recent 2-D echo showed normal bowel function. She is on Coumadin anticoagulation.

## 2016-07-09 NOTE — Assessment & Plan Note (Signed)
History of hyperlipidemia not on statin therapy followed by her PCP 

## 2016-07-14 ENCOUNTER — Ambulatory Visit (INDEPENDENT_AMBULATORY_CARE_PROVIDER_SITE_OTHER): Payer: Medicare Other | Admitting: Pharmacist

## 2016-07-14 ENCOUNTER — Encounter: Payer: Self-pay | Admitting: Internal Medicine

## 2016-07-14 DIAGNOSIS — Z952 Presence of prosthetic heart valve: Secondary | ICD-10-CM | POA: Diagnosis not present

## 2016-07-14 DIAGNOSIS — Z7901 Long term (current) use of anticoagulants: Secondary | ICD-10-CM | POA: Diagnosis not present

## 2016-07-14 LAB — POCT INR: INR: 2

## 2016-07-17 ENCOUNTER — Other Ambulatory Visit: Payer: Self-pay | Admitting: Cardiovascular Disease

## 2016-07-31 ENCOUNTER — Other Ambulatory Visit: Payer: Self-pay

## 2016-07-31 ENCOUNTER — Encounter (INDEPENDENT_AMBULATORY_CARE_PROVIDER_SITE_OTHER): Payer: Self-pay

## 2016-07-31 ENCOUNTER — Ambulatory Visit (HOSPITAL_COMMUNITY): Payer: Medicare Other | Attending: Cardiology

## 2016-07-31 DIAGNOSIS — I480 Paroxysmal atrial fibrillation: Secondary | ICD-10-CM | POA: Diagnosis not present

## 2016-07-31 DIAGNOSIS — I351 Nonrheumatic aortic (valve) insufficiency: Secondary | ICD-10-CM | POA: Diagnosis not present

## 2016-08-05 ENCOUNTER — Ambulatory Visit (INDEPENDENT_AMBULATORY_CARE_PROVIDER_SITE_OTHER): Payer: Medicare Other | Admitting: Pharmacist Clinician (PhC)/ Clinical Pharmacy Specialist

## 2016-08-05 DIAGNOSIS — Z7901 Long term (current) use of anticoagulants: Secondary | ICD-10-CM

## 2016-08-05 DIAGNOSIS — Z952 Presence of prosthetic heart valve: Secondary | ICD-10-CM

## 2016-08-05 LAB — POCT INR: INR: 2.5

## 2016-08-06 ENCOUNTER — Telehealth: Payer: Self-pay | Admitting: Cardiovascular Disease

## 2016-08-06 NOTE — Telephone Encounter (Signed)
ECHOCARDIOGRAM COMPLETE  Order: [id number]  Status:  Final result Visible to patient:  No (Not Released) Dx:  Paroxysmal atrial fibrillation ([place])  Notes Recorded by Therisa Doyne on 08/06/2016 at 3:41 PM EST Results given to pt. Pt verbalized understanding.  ------  Notes Recorded by Lorretta Harp, MD on 08/02/2016 at 4:18 PM EST Low nl LV fxn with nl fxn MV prosthesis

## 2016-09-01 ENCOUNTER — Ambulatory Visit (INDEPENDENT_AMBULATORY_CARE_PROVIDER_SITE_OTHER): Payer: Medicare Other | Admitting: Pharmacist Clinician (PhC)/ Clinical Pharmacy Specialist

## 2016-09-01 DIAGNOSIS — Z7901 Long term (current) use of anticoagulants: Secondary | ICD-10-CM | POA: Diagnosis not present

## 2016-09-01 DIAGNOSIS — Z952 Presence of prosthetic heart valve: Secondary | ICD-10-CM

## 2016-09-01 LAB — POCT INR: INR: 3.3

## 2016-09-02 ENCOUNTER — Telehealth: Payer: Self-pay

## 2016-09-02 NOTE — Telephone Encounter (Signed)
LVM for pt to schedule sleep consult appt. Called both numbers available.

## 2016-09-12 ENCOUNTER — Other Ambulatory Visit: Payer: Self-pay | Admitting: Internal Medicine

## 2016-09-12 DIAGNOSIS — R1310 Dysphagia, unspecified: Secondary | ICD-10-CM

## 2016-09-16 ENCOUNTER — Other Ambulatory Visit: Payer: Medicare Other

## 2016-09-22 ENCOUNTER — Encounter: Payer: Self-pay | Admitting: Cardiovascular Disease

## 2016-09-22 ENCOUNTER — Ambulatory Visit (INDEPENDENT_AMBULATORY_CARE_PROVIDER_SITE_OTHER): Payer: Medicare Other | Admitting: Cardiovascular Disease

## 2016-09-22 ENCOUNTER — Ambulatory Visit (INDEPENDENT_AMBULATORY_CARE_PROVIDER_SITE_OTHER): Payer: Medicare Other | Admitting: Pharmacist Clinician (PhC)/ Clinical Pharmacy Specialist

## 2016-09-22 VITALS — BP 126/64 | HR 68 | Ht 62.0 in | Wt 106.0 lb

## 2016-09-22 DIAGNOSIS — I480 Paroxysmal atrial fibrillation: Secondary | ICD-10-CM

## 2016-09-22 DIAGNOSIS — I5032 Chronic diastolic (congestive) heart failure: Secondary | ICD-10-CM | POA: Diagnosis not present

## 2016-09-22 DIAGNOSIS — Z7901 Long term (current) use of anticoagulants: Secondary | ICD-10-CM

## 2016-09-22 DIAGNOSIS — Z952 Presence of prosthetic heart valve: Secondary | ICD-10-CM

## 2016-09-22 DIAGNOSIS — E43 Unspecified severe protein-calorie malnutrition: Secondary | ICD-10-CM

## 2016-09-22 DIAGNOSIS — Z95810 Presence of automatic (implantable) cardiac defibrillator: Secondary | ICD-10-CM | POA: Diagnosis not present

## 2016-09-22 DIAGNOSIS — E441 Mild protein-calorie malnutrition: Secondary | ICD-10-CM | POA: Insufficient documentation

## 2016-09-22 LAB — POCT INR: INR: 3.7

## 2016-09-22 NOTE — Progress Notes (Signed)
Cardiology Office Note    Date:  09/22/2016   ID:  [name], DOB [birth year], MRN [id number]  PCP:  [name], MD  Cardiologist:  Quay Burow, M.D.; Sanda Klein, MD   Chief Complaint  Patient presents with  . Follow-up    PT HAS NO COMPLAINTS TODAY   Preoperative evaluation  History of Present Illness:  [name] is a 76 y.o. female here for follow-up on her biventricular pacemaker/defibrillator and chronic heart failure.  She had right shoulder surgery last year and is planning left shoulder surgery in the near future. She was able to self administer and occiput on perioperatively.   She is on chronic warfarin anticoagulation for mitral valve replacement with a mechanical prosthesis and atrial fibrillation.   Shee denies problems with angina, dyspnea, palpitations, syncope, edema, focal neurological events or any bleeding problems. She is plagued by recurrent episodes of unpredictable nausea and vomiting. She has lost a tremendous amount of weight and at some point actually weighed less than 100 pounds. Extensive workup by her gastroenterologist has failed to reveal a clear etiology. She is very frustrated. She is receiving some type of medication for nausea that she was told to use "sparingly". It is not on her current medicine list. She reports that it has helped, but she continues to have vomiting about twice a week. She is never hungry. She is unable to even hold 3 bottles of ensure down every day.  Echo performed March 18 showed left ventricular ejection fraction of 45-50 percent and normal function of the mitral valve prosthesis.  Otherwise, The device check today shows normal function. She has a Artist XT CRT-D implanted in 2012. Generator voltage is 2.64 V (ERI 2.63 V). She has 98.7% biventricular pacing. She also has 56% atrial pacing. She has not had atrial fibrillation or ventricular tachycardia. She is not pacemaker dependent. Given the  level has started to improve recently and is now up to 2.7 hours a day. No evidence of fluid overloaded by thoracic impedance  [name] has a long-standing history of valvular heart disease, undergoing mitral valve repair in 2002, followed by a mitral valve replacement with a mechanical prosthesis in 2007. Left ventricular ejection fraction which has been as low as 10% improved substantially after implantation of the CRT-D device and now her LVEF is near normal. She received her initial CRT-D in 2008 and underwent a generator change out in 2012. She has not received VT therapy. She has close to 100% biventricular pacing efficiency. She has a Riata 7 [place] ICD lead under advisory, which appeared fluoroscopically normal at the time of for generator change and which still has normal parameters. Did not have any angiographic CAD prior to her surgery.   Past Medical History:  Diagnosis Date  . Allergy    seasonal  . Anemia   . Anxiety   . Arthritis   . Asthma   . 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 Protecta  . Biventricular ICD (implantable cardioverter-defibrillator) in place    with CRT  . Blood transfusion without reported diagnosis   . Bursitis   . Cataract    RIGHT EYE  . CHF (congestive heart failure) ([place])   . Chronic kidney disease   . Colon polyp    adenomatous  . Complication of anesthesia    patient stated that had difficulty getting the breathing tube removed, patient said that she stopped breathing and HR dropped to 10  patient  then woke up and started breathing pateint stated no longer than one minute; re-intubated in PACU following cholecystectomy 10/28/13  . COPD (chronic obstructive pulmonary disease) ([place])   . Depression   . Diverticulosis   . Dysrhythmia   . Fibromyalgia   . GERD (gastroesophageal reflux disease)   . Gout   . H/O mitral valve replacement 2002, 2007  . Heart murmur   . Hemorrhoids   . Hyperlipidemia   .  Hypertension   . Hypothyroidism   . IBS (irritable bowel syndrome)   . MVP (mitral valve prolapse)   . Neuromuscular disorder (HCC)    fibromyalgia  . Pacemaker   . Peptic ulcer disease     Past Surgical History:  Procedure Laterality Date  . ABDOMINAL HYSTERECTOMY    . CARDIAC CATHETERIZATION  02/25/2006   normal left main, normal LAD, normal L Cfx, normal/dominant RCA (Dr. [name])  . CARDIAC DEFIBRILLATOR PLACEMENT  2007, 11/202012   x2 (pacemaker) (Dr. Jerilynn Mages. Blaire Hodsdon)  . CARDIAC VALVE REPLACEMENT  2002   MV repair - Dr. Berle Mull  . CHOLECYSTECTOMY N/A 10/28/2013   Procedure: LAPAROSCOPIC CHOLECYSTECTOMY WITH INTRAOPERATIVE CHOLANGIOGRAM;  Surgeon: Edward Jolly, MD;  Location: [facility];  Service: General;  Laterality: N/A;  . COLONOSCOPY    . EYE SURGERY    . IMPLANTABLE CARDIOVERTER DEFIBRILLATOR (ICD) GENERATOR CHANGE N/A 04/10/2011   Procedure: ICD GENERATOR CHANGE;  Surgeon: Sanda Klein, MD;  Location: [facility] CATH [facility];  Service: Cardiovascular;  Laterality: N/A;  . KNEE ARTHROSCOPY    . MITRAL VALVE REPLACEMENT  02/26/2006   re-do MVR w/78m St. [name] (Dr. BEllison Hughs  . NM MYOCAR PERF WALL MOTION  2005   persantine myoview - low ris, EF 63%  . RIGHT HEART CATH  04/03/2006   pulm cap wedge pressure 24/24, PA pressure 43/22 (mean 373mg), CO 4.8, CI 4.1 (Dr. J.Jackie Plum . TOTAL SHOULDER ARTHROPLASTY Right 12/27/2015   Procedure: RIGHT TOTAL SHOULDER ARTHROPLASTY;  Surgeon: JuTania AdeMD;  [facility] Service: Orthopedics;  Laterality: Right;  Right total shoulder arthroplasty  . TRANSTHORACIC ECHOCARDIOGRAM  12/2011   EF 50-55%, mild global hypokinesis; LA severely dilated; calcification of anterior/posterior MV leaflets, bi-leaflets St. [name] mechanical MV; mild TR; trace AV regurg/pulm valve regurg    Current Medications: Outpatient Medications Prior to Visit  Medication Sig Dispense Refill  . allopurinol (ZYLOPRIM) 300 MG tablet Take 300 mg by mouth every  evening.     . Marland Kitchenmiodarone (PACERONE) 200 MG tablet Take 0.5 tablets (100 mg total) by mouth daily. 30 tablet 6  . amoxicillin (AMOXIL) 500 MG capsule Take 4 capsules 1 hour prior to dental appointment. 4 capsule 2  . enoxaparin (LOVENOX) 60 MG/0.6ML injection Inject 0.6 mLs (60 mg total) into the skin every 12 (twelve) hours. As directed 20 Syringe 0  . Ferrous Fumarate (HEMOCYTE - 106 MG FE) 324 (106 Fe) MG TABS tablet Take 1 tablet by mouth daily.    . furosemide (LASIX) 40 MG tablet TAKE 2 TABLETS BY MOUTH EVERY MORNING AND 1 TAB EVERY AFTERNOON 90 tablet 4  . metoprolol tartrate (LOPRESSOR) 25 MG tablet Take 25 mg by mouth daily.     . Multiple Vitamins-Minerals (MULTIVITAMINS THER. W/MINERALS) TABS Take 1 tablet by mouth every morning.     . Marland Kitchenmeprazole (PRILOSEC) 40 MG capsule Take 1 capsule (40 mg total) by mouth daily. 30-60 mins before breakfast 60 capsule 2  . PROAIR HFA 108 (90 BASE) MCG/ACT inhaler Inhale  1-2 puffs into the lungs every 6 (six) hours as needed for wheezing.     . Sennosides (SENNA) 15 MG TABS Take 1 tablet by mouth every other day.    . spironolactone (ALDACTONE) 25 MG tablet Take 1 tablet (25 mg total) by mouth daily. 30 tablet 5  . SYNTHROID 50 MCG tablet Take 50 mcg by mouth daily.  2  . warfarin (COUMADIN) 2.5 MG tablet Take 1 tablets by mouth daily OR as directed by coumadin clinic 90 tablet 1   Facility-Administered Medications Prior to Visit  Medication Dose Route Frequency Provider Last Rate Last Dose  . 0.9 %  sodium chloride infusion  500 mL Intravenous Continuous Irene Shipper, MD         Allergies:   Ace inhibitors; Lipitor [atorvastatin calcium]; and Codeine   Social History   Social History  . Marital status: Married    Spouse name: N/A  . Number of children: 4  . Years of education: 12   Occupational History  . retired    Social History Main Topics  . Smoking status: Former Smoker    Types: Cigarettes    Quit date: 06/02/2001  . Smokeless  tobacco: Never Used  . Alcohol use 0.0 oz/week     Comment: social-1 every 6 months  . Drug use: No  . Sexual activity: Not Asked   Other Topics Concern  . None   Social History Narrative   Drinks little caffeine      Family History:  The patient's family history includes Breast cancer in her sister; Crohn's disease in her sister; Heart attack in her father and mother; Heart disease in her father, mother, sister, and son; Kidney disease in her father; Uterine cancer in her sister.   ROS:   Please see the history of present illness.    ROS All other systems reviewed and are negative.   PHYSICAL EXAM:   VS:  BP 126/64 (BP Location: Left Arm, Patient Position: Sitting, Cuff Size: Normal)   Pulse 68   Ht 5' 2"  (1.575 m)   Wt 48.1 kg (106 lb)   BMI 19.39 kg/m    GEN: Well nourished, well developed, in no acute distress  HEENT: normal  Neck: JVP roughly 7-8 centimeters above the sternal angle, carotid bruits, or masses Cardiac: Crisp mechanical valve clicks RRR; no murmurs, rubs, or gallops,no edema, healthy left subclavian ICD site  Respiratory:  clear to auscultation bilaterally, normal work of breathing GI: soft, nontender, nondistended, + BS MS: no deformity or atrophy  Skin: warm and dry, no rash Neuro:  Alert and Oriented x 3, Strength and sensation are intact Psych: euthymic mood, full affect  Wt Readings from Last 3 Encounters:  09/22/16 48.1 kg (106 lb)  07/09/16 50.7 kg (111 lb 11.2 oz)  07/08/16 50.8 kg (112 lb)      Studies/Labs Reviewed:   EKG:  EKG is not ordered today.    Recent Labs: 12/28/2015: Hemoglobin 9.9; Platelets 249 03/24/2016: ALT 12; Brain Natriuretic Peptide 105.0; BUN 13; Creat 1.14; Potassium 5.2; Sodium 140; TSH 2.84    ASSESSMENT:    1. Chronic diastolic heart failure ([place])   2. Biventricular ICD (implantable cardioverter-defibrillator) in place   3. History of mitral valve replacement with mechanical valve   4. Paroxysmal atrial  fibrillation (HCC)   5. Protein-calorie malnutrition, severe ([place])      PLAN:  In order of problems listed above:  1. CHF: well compensated. No evidence of fluid  overload clinically or by device check 2. CRT-D: Normal function of her biventricular pacemaker defibrillator. She is not pacemaker dependent. The device was implanted for congestive heart failure due to dyssynchrony. She is a "hyper responder" with her LVEF increasing from 10% to ear-normal with CRT (last echo 07/2016). When she has left shoulder surgery I would recommend actually programming tachycardia therapies off since electrocautery use will be so close to the device. A magnet would fall into her surgical window. 3. S/P MVR mechanical prosthesis: Normal function of the prosthesis by her very recent echo. 4. AFib: Not recorded by her device in a long time. On amiodarone therapy for years. It's unlikely this is the cause of her nausea and vomiting, but since arrhythmia has not occurred in such a long time and she is on a minute dose of amiodarone, I suggested we discontinued it altogether  5. Warfarin: She has done well with self-administered enoxaparin anticoagulation. We can do this again for planned surgery for her left shoulder. Anticipate she will need gradual increase in the dose of warfarin as the amiodarone wears off. 6. Substantial weight loss, now stabilizing, but still with episodes of unexplained vomiting      Medication Adjustments/Labs and Tests Ordered: Current medicines are reviewed at length with the patient today.  Concerns regarding medicines are outlined above.  Medication changes, Labs and Tests ordered today are listed in the Patient Instructions below. There are no Patient Instructions on file for this visit.   Signed, Sanda Klein, MD  09/22/2016 9:33 AM    [facility] [address], [place], [place]  [address] Phone: [phone]; Fax: [phone]

## 2016-09-22 NOTE — Patient Instructions (Signed)
Dr Sallyanne Kuster has recommended making the following medication changes: 1. STOP Amiodarone  Remote monitoring is used to monitor your Pacemaker of ICD from home. This monitoring reduces the number of office visits required to check your device to one time per year. It allows [place] to keep an eye on the functioning of your device to ensure it is working properly. You are scheduled for a device check from home on Monday, July 23rd, 2018. You may send your transmission at any time that day. If you have a wireless device, the transmission will be sent automatically. After your physician reviews your transmission, you will receive a postcard with your next transmission date.  Dr Sallyanne Kuster recommends that you schedule a follow-up appointment in 12 months with a defibrillator check. You will receive a reminder letter in the mail two months in advance. If you don't receive a letter, please call our office to schedule the follow-up appointment.  If you need a refill on your cardiac medications before your next appointment, please call your pharmacy.

## 2016-09-24 ENCOUNTER — Ambulatory Visit
Admission: RE | Admit: 2016-09-24 | Discharge: 2016-09-24 | Disposition: A | Discharge status: Home / Self Care | Payer: Medicare Other | Source: Ambulatory Visit | Attending: Internal Medicine | Admitting: Internal Medicine

## 2016-09-24 DIAGNOSIS — R1310 Dysphagia, unspecified: Secondary | ICD-10-CM

## 2016-09-24 LAB — CUP PACEART INCLINIC DEVICE CHECK
Implantable Lead Implant Date: 20080221
Implantable Lead Implant Date: 20080221
Implantable Lead Location: 753858
Implantable Lead Location: 753860
Implantable Lead Model: 7002
Implantable Lead Serial Number: 134379
Implantable Pulse Generator Implant Date: 20080221
Lead Channel Setting Pacing Pulse Width: 0.4 ms
Lead Channel Setting Sensing Sensitivity: 0.3 mV
MDC IDC LEAD IMPLANT DT: 20080221
MDC IDC LEAD LOCATION: 753859
MDC IDC SESS DTM: 20180425130144
MDC IDC SET LEADCHNL LV PACING AMPLITUDE: 1.75 V
MDC IDC SET LEADCHNL RA PACING AMPLITUDE: 1.5 V
MDC IDC SET LEADCHNL RV PACING AMPLITUDE: 2.25 V
MDC IDC SET LEADCHNL RV PACING PULSEWIDTH: 0.4 ms

## 2016-09-25 ENCOUNTER — Other Ambulatory Visit: Payer: Self-pay | Admitting: Cardiovascular Disease

## 2016-09-25 ENCOUNTER — Telehealth: Payer: Self-pay | Admitting: Neurology

## 2016-09-25 NOTE — Telephone Encounter (Signed)
Patient has been called twice and patient is unable to schedule because she is unable to afford the test.

## 2016-09-26 ENCOUNTER — Encounter: Payer: Self-pay | Admitting: Physician Assistant

## 2016-09-26 ENCOUNTER — Ambulatory Visit (INDEPENDENT_AMBULATORY_CARE_PROVIDER_SITE_OTHER): Payer: Medicare Other | Admitting: Physician Assistant

## 2016-09-26 VITALS — BP 110/54 | HR 68 | Ht 62.21 in | Wt 107.0 lb

## 2016-09-26 DIAGNOSIS — K219 Gastro-esophageal reflux disease without esophagitis: Secondary | ICD-10-CM

## 2016-09-26 DIAGNOSIS — R112 Nausea with vomiting, unspecified: Secondary | ICD-10-CM | POA: Diagnosis not present

## 2016-09-26 DIAGNOSIS — R1084 Generalized abdominal pain: Secondary | ICD-10-CM

## 2016-09-26 DIAGNOSIS — R1310 Dysphagia, unspecified: Secondary | ICD-10-CM

## 2016-09-26 MED ORDER — PANTOPRAZOLE SODIUM 40 MG PO TBEC: 40.0000 mg | DELAYED_RELEASE_TABLET | Freq: Two times a day (BID) | ORAL | 3 refills | Status: DC

## 2016-09-26 NOTE — Progress Notes (Signed)
Chief Complaint: GERD, nausea and vomiting, abdominal pain, bloating and dysphagia  HPI:  [name] is a 76 year old Caucasian female with an extensive medical history as listed below, who was referred to me by Leanna Battles, MD for a complaint of continued dysphagia, GERD, nausea and vomiting, abdominal pain and bloating .    Patient was last seen in clinic on 06/26/16 by myself, please see extensive office visit note from that day. She describes many of the same symptoms she has today and was arranged for an EGD and colonoscopy with Dr. Henrene Pastor. She was also increased to Omeprazole 40 mg twice daily from once daily. She was also prescribed Zofran 8 mg every 4-6 hours as needed for nausea.    Colonoscopy completed 07/08/16 showed diverticulosis in the sigmoid colon was otherwise normal. EGD showed a benign-appearing esophageal stenosis which was dilated using a 27 French Maloney dilator and an otherwise normal stomach and examined duodenum.     Patient had recent repeat DG of the esophagus in 09/24/16 due to continued dysphagia. This showed that a barium pill lodged just above the gastroesophageal junction indicating a short segment distal esophageal stricture as well as moderate gastroesophageal reflux with no hiatal hernia.   Today, the patient returns to clinic accompanied by her husband and tells me that she has had continued symptoms. She tells me she is still not able to eat any solid food at all or she will vomit it and even sometimes when she just drinks Ensure or liquids they come back up sometimes 20 minutes later and sometimes right after eating them , as if they get caught on the way down. She tells me that if she does not vomit her food she will end up with diarrhea 20 minutes after eating. She has been able to gain 1-2 pounds since being seen last as have been "force feeding myself". Patient has continued on her Omeprazole 40 mg twice a day religiously and is only using Zofran 8 mg as  needed. 15 pills have lasted her almost 3 months. Patient notes that she is nauseous and develops abdominal pain which she believes is from "excess gas", which will result and bloating.   Today the patient is very uncomfortable and tells me that last night she started with a generalized abdominal pain which was "rolling" around her abdomen and was worse if she tried to get up and walk around and would result in dry heaves. She has not been able to keep anything down overnight. Patient does tell me that her amiodarone was stopped in the past couple of months as this was thought to be causing her nausea but has made no change to her symptoms. She is continued on iron per request of her nephrologist.   Patient denies fever, chills, blood in her stool, melena, change in bowel habits or symptoms that awaken her at night.  Past Medical History:  Diagnosis Date  . Allergy    seasonal  . Anemia   . Anxiety   . Arthritis   . Asthma   . 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 Protecta  . Biventricular ICD (implantable cardioverter-defibrillator) in place    with CRT  . Blood transfusion without reported diagnosis   . Bursitis   . Cataract    RIGHT EYE  . CHF (congestive heart failure) ([place])   . Chronic kidney disease   . Colon polyp    adenomatous  . Complication of anesthesia    patient  stated that had difficulty getting the breathing tube removed, patient said that she stopped breathing and HR dropped to 10  patient then woke up and started breathing pateint stated no longer than one minute; re-intubated in PACU following cholecystectomy 10/28/13  . COPD (chronic obstructive pulmonary disease) ([place])   . Depression   . Diverticulosis   . Dysrhythmia   . Fibromyalgia   . GERD (gastroesophageal reflux disease)   . Gout   . H/O mitral valve replacement 2002, 2007  . Heart murmur   . Hemorrhoids   . Hyperlipidemia   . Hypertension   . Hypothyroidism   . IBS  (irritable bowel syndrome)   . MVP (mitral valve prolapse)   . Neuromuscular disorder (HCC)    fibromyalgia  . Pacemaker   . Peptic ulcer disease     Past Surgical History:  Procedure Laterality Date  . ABDOMINAL HYSTERECTOMY    . CARDIAC CATHETERIZATION  02/25/2006   normal left main, normal LAD, normal L Cfx, normal/dominant RCA (Dr. [name])  . CARDIAC DEFIBRILLATOR PLACEMENT  2007, 11/202012   x2 (pacemaker) (Dr. Jerilynn Mages. Croitoru)  . CARDIAC VALVE REPLACEMENT  2002   MV repair - Dr. Berle Mull  . CHOLECYSTECTOMY N/A 10/28/2013   Procedure: LAPAROSCOPIC CHOLECYSTECTOMY WITH INTRAOPERATIVE CHOLANGIOGRAM;  Surgeon: Edward Jolly, MD;  Location: [facility];  Service: General;  Laterality: N/A;  . COLONOSCOPY    . EYE SURGERY    . IMPLANTABLE CARDIOVERTER DEFIBRILLATOR (ICD) GENERATOR CHANGE N/A 04/10/2011   Procedure: ICD GENERATOR CHANGE;  Surgeon: Sanda Klein, MD;  Location: [facility] CATH LAB;  Service: Cardiovascular;  Laterality: N/A;  . KNEE ARTHROSCOPY    . MITRAL VALVE REPLACEMENT  02/26/2006   re-do MVR w/53m St. [name] (Dr. BEllison Hughs  . NM MYOCAR PERF WALL MOTION  2005   persantine myoview - low ris, EF 63%  . RIGHT HEART CATH  04/03/2006   pulm cap wedge pressure 24/24, PA pressure 43/22 (mean 344mg), CO 4.8, CI 4.1 (Dr. J.Jackie Plum . TOTAL SHOULDER ARTHROPLASTY Right 12/27/2015   Procedure: RIGHT TOTAL SHOULDER ARTHROPLASTY;  Surgeon: JuTania AdeMD;  [facility] Service: Orthopedics;  Laterality: Right;  Right total shoulder arthroplasty  . TRANSTHORACIC ECHOCARDIOGRAM  12/2011   EF 50-55%, mild global hypokinesis; LA severely dilated; calcification of anterior/posterior MV leaflets, bi-leaflets St. [name] mechanical MV; mild TR; trace AV regurg/pulm valve regurg    Current Outpatient Prescriptions  Medication Sig Dispense Refill  . allopurinol (ZYLOPRIM) 300 MG tablet Take 300 mg by mouth every evening.     . Marland Kitchenmoxicillin (AMOXIL) 500 MG capsule Take 4 capsules 1  hour prior to dental appointment. 4 capsule 2  . buPROPion (WELLBUTRIN) 100 MG tablet Take 100 mg by mouth daily.  12  . enoxaparin (LOVENOX) 60 MG/0.6ML injection Inject 0.6 mLs (60 mg total) into the skin every 12 (twelve) hours. As directed 20 Syringe 0  . Ferrous Fumarate (HEMOCYTE - 106 MG FE) 324 (106 Fe) MG TABS tablet Take 1 tablet by mouth daily.    . furosemide (LASIX) 40 MG tablet TAKE 2 TABLETS BY MOUTH EVERY MORNING AND 1 TAB EVERY AFTERNOON 90 tablet 4  . metoprolol tartrate (LOPRESSOR) 25 MG tablet Take 25 mg by mouth daily.     . Multiple Vitamins-Minerals (MULTIVITAMINS THER. W/MINERALS) TABS Take 1 tablet by mouth every morning.     . Marland Kitchenmeprazole (PRILOSEC) 40 MG capsule Take 1 capsule (40 mg total) by mouth daily. 30-60 mins before  breakfast 60 capsule 2  . PROAIR HFA 108 (90 BASE) MCG/ACT inhaler Inhale 1-2 puffs into the lungs every 6 (six) hours as needed for wheezing.     . Sennosides (SENNA) 15 MG TABS Take 1 tablet by mouth every other day.    . spironolactone (ALDACTONE) 25 MG tablet Take 1 tablet (25 mg total) by mouth daily. 30 tablet 5  . SYNTHROID 50 MCG tablet Take 50 mcg by mouth daily.  2  . warfarin (COUMADIN) 2.5 MG tablet Take 1 tablets by mouth daily OR as directed by coumadin clinic 90 tablet 1  . pantoprazole (PROTONIX) 40 MG tablet Take 1 tablet (40 mg total) by mouth 2 (two) times daily before a meal. 60 tablet 3   Current Facility-Administered Medications  Medication Dose Route Frequency Provider Last Rate Last Dose  . 0.9 %  sodium chloride infusion  500 mL Intravenous Continuous Irene Shipper, MD        Allergies as of 09/26/2016 - Review Complete 09/26/2016  Allergen Reaction Noted  . Ace inhibitors Cough 08/01/2008  . Lipitor [atorvastatin calcium] Other (See Comments) 04/07/2011  . Codeine Nausea And Vomiting 01/19/2014    Family History  Problem Relation Age of Onset  . Breast cancer Sister     multiple  . Uterine cancer Sister      multiple  . Heart disease Mother   . Heart attack Mother   . Heart disease Father   . Kidney disease Father   . Heart attack Father   . Heart disease Sister   . Crohn's disease Sister   . Heart disease Son   . Colon cancer Neg Hx   . Stomach cancer Neg Hx     Social History   Social History  . Marital status: Married    Spouse name: N/A  . Number of children: 4  . Years of education: 12   Occupational History  . retired    Social History Main Topics  . Smoking status: Former Smoker    Types: Cigarettes    Quit date: 06/02/2001  . Smokeless tobacco: Never Used  . Alcohol use 0.0 oz/week     Comment: social-1 every 6 months  . Drug use: No  . Sexual activity: Not on file   Other Topics Concern  . Not on file   Social History Narrative   Drinks little caffeine     Review of Systems:    Constitutional: No weight loss, fever or chills Cardiovascular: No chest pain Respiratory: No SOB  Gastrointestinal: See HPI and otherwise negative  Physical Exam:  Vital signs: BP (!) 110/54 (BP Location: Left Arm, Patient Position: Sitting, Cuff Size: Normal)   Pulse 68   Ht 5' 2.21" (1.58 m) Comment: height measured without shoes  Wt 107 lb (48.5 kg)   BMI 19.44 kg/m   Constitutional:   Pleasant thin-appearing Elderly Caucasian female appears to be in NAD, Well developed, Well nourished, alert and cooperative Respiratory: Respirations even and unlabored. Lungs clear to auscultation bilaterally.   No wheezes, crackles, or rhonchi.  Cardiovascular: Normal S1, S2. No MRG. Regular rate and rhythm. No peripheral edema, cyanosis or pallor.  Gastrointestinal:  Soft, nondistended, moderate generalized abdominal tenderness No rebound or guarding. Increased BS all four quadrants No appreciable masses or hepatomegaly. Psychiatric: Demonstrates good judgement and reason without abnormal affect or behaviors.  No recent labs.  Imaging: ESOPHOGRAM / BARIUM SWALLOW / BARIUM TABLET STUDY  09/24/16  TECHNIQUE: Combined double contrast and single  contrast examination performed using effervescent crystals, thick barium liquid, and thin barium liquid. The patient was observed with fluoroscopy swallowing a 13 mm barium sulphate tablet.  FLUOROSCOPY TIME:  Fluoroscopy Time:  1 minutes 54 seconds  Radiation Exposure Index (if provided by the fluoroscopic device): 261 mGy  Number of Acquired Spot Images: 0  COMPARISON:  Upper GI of 06/10/2016  FINDINGS: The double contrast portion of the study is suboptimal but no mucosal abnormality is noted. Rapid sequence spot films of the cervical esophagus show the swallowing mechanism to be normal. No penetration or aspiration is seen. Esophageal peristalsis is normal. No hiatal hernia is seen. However moderate gastroesophageal reflux is demonstrated. The barium pill does lodge just above the gastroesophageal junction and does not pass after additional water and barium were given indicating a short segment distal esophageal stricture.  IMPRESSION: 1. Barium pill lodges just above the gastroesophageal junction indicating a short segment distal esophageal stricture. 2. Moderate gastroesophageal reflux.  No hiatal hernia.   Electronically Signed   By: Ivar Drape M.D.   On: 09/24/2016 10:03  Assessment: 1. Dysphagia: Continues per the patient, repeat stricture as above, per Dr. Henrene Pastor this is small caliber and we will not repeat EGD at this time, did provide patient with dysphagia diet 2. GERD: Illustrated on esophagram above irregardless of Omeprazole 40 mg twice a day 3. Nausea and vomiting: continues for the patient, consider relation to iron supplementation versus functional dyspepsia versus other 4. Abdominal pain: Continues for the patient, she believes this is "gas pains", asit "rolls around her abdomen" ; consider relation to polypharmacy versus IBS versus other  Plan: 1. Patient is very complicated with  multiple medical comorbidities and continues with many of the same symptoms as in January of this year. She did well for about 2 weeks after her last endoscopy in February but has since continued with dysphagia and all the symptoms above. Recent DG of the esophagus showed a repeat stricture. 2. Discussed case with Dr. Henrene Pastor at the time of patient's appointment. He recommended ordering a gastric emptying study. 3. Recommend the patient stop her iron supplement as sometimes this can give abdominal discomfort and nausea 4. Will trial a change of patient's Omeprazole to Pantoprazole 40 mg twice a day due to signs of continued reflux during recent esophagram 5. Started the patient on FD guard 2 tabs twice a day provided her with samples and coupons 6. Reviewed anti-dysphagia measures. Also provide patient with a dysphagia diet handout 7. Recommend the patient go ahead and schedule her Zofran 8 mg every 6-8 hours, refilled this medication 8. Patient to follow with Dr. Henrene Pastor only in 6 weeks.  Spent 40 minutes in consultation and coordination of care for this patient.  Ellouise Newer, PA-C [facility] 09/26/2016, 10:20 AM  Cc: Leanna Battles, MD

## 2016-09-26 NOTE — Progress Notes (Signed)
Case reviewed with PA

## 2016-09-26 NOTE — Patient Instructions (Addendum)
We have sent the following medications to your pharmacy for you to pick up at your convenience:  Pantoprazole 40 mg twice a day  Please purchase the following medications over the counter and take as directed: FD gard 2 tabs twice a day  We have given you a handout on a diet for dysphagia.   Schedule Zofran 8 mg every 6 hrs as needed.  Stop Iron.   You have been scheduled for a gastric emptying scan at [facility] on 10/08/16 at 7:30 am. Please arrive at least 15 minutes prior to your appointment for registration. Please make certain not to have anything to eat or drink after midnight the night before your test. Hold all stomach medications (ex: Zofran, phenergan, Reglan) 8 hours prior to your test. If you need to reschedule your appointment, please contact radiology scheduling at [phone]. _____________________________________________________________________ A gastric-emptying study measures how long it takes for food to move through your stomach. There are several ways to measure stomach emptying. In the most common test, you eat food that contains a small amount of radioactive material. A scanner that detects the movement of the radioactive material is placed over your abdomen to monitor the rate at which food leaves your stomach. This test normally takes about 4 hours to complete. _____________________________________________________________________

## 2016-10-08 ENCOUNTER — Ambulatory Visit (HOSPITAL_COMMUNITY)
Admission: RE | Admit: 2016-10-08 | Discharge: 2016-10-08 | Disposition: A | Discharge status: Home / Self Care | Payer: Medicare Other | Source: Ambulatory Visit | Attending: Physician Assistant | Admitting: Physician Assistant

## 2016-10-08 DIAGNOSIS — R1084 Generalized abdominal pain: Secondary | ICD-10-CM | POA: Insufficient documentation

## 2016-10-08 DIAGNOSIS — R112 Nausea with vomiting, unspecified: Secondary | ICD-10-CM | POA: Diagnosis present

## 2016-10-08 DIAGNOSIS — R1310 Dysphagia, unspecified: Secondary | ICD-10-CM | POA: Diagnosis not present

## 2016-10-08 DIAGNOSIS — K219 Gastro-esophageal reflux disease without esophagitis: Secondary | ICD-10-CM | POA: Insufficient documentation

## 2016-10-08 MED ORDER — TECHNETIUM TC 99M SULFUR COLLOID
2.1000 | Freq: Once | INTRAVENOUS | Status: AC | PRN
  Administered 2016-10-08: 2.1 via INTRAVENOUS

## 2016-10-13 ENCOUNTER — Ambulatory Visit (INDEPENDENT_AMBULATORY_CARE_PROVIDER_SITE_OTHER): Payer: Medicare Other | Admitting: Pharmacist Clinician (PhC)/ Clinical Pharmacy Specialist

## 2016-10-13 ENCOUNTER — Other Ambulatory Visit: Payer: Self-pay

## 2016-10-13 ENCOUNTER — Telehealth: Payer: Self-pay | Admitting: Internal Medicine

## 2016-10-13 DIAGNOSIS — Z952 Presence of prosthetic heart valve: Secondary | ICD-10-CM

## 2016-10-13 DIAGNOSIS — Z7901 Long term (current) use of anticoagulants: Secondary | ICD-10-CM

## 2016-10-13 LAB — POCT INR: INR: 3.4

## 2016-10-13 NOTE — Telephone Encounter (Signed)
[name] I spoke to the pt today and scheduled her for an appt with Dr Henrene Pastor.  [name] states only Dr Henrene Pastor is to see her.  I am not sure why or what the reason is so I think Dr Henrene Pastor may need to be brought in on her triage.  Thanks.  Notes recorded by Levin Erp, PA on 10/13/2016 at 8:48 AM EDT Normal gastric emptying study. Please ensure she has follow up with Dr. Henrene Pastor ONLY. Thank you-JLL

## 2016-10-13 NOTE — Telephone Encounter (Signed)
Patient is not scheduled with you until 6/12.

## 2016-10-13 NOTE — Telephone Encounter (Signed)
I'm unsure why she has been vomiting. There are multiple possibilities. Some may be GI in nature. Many are not, such as medication related. Her endoscopy looked fine. No obvious explanation based on her endoscopy. What unsure order a solid-phase gastric emptying scan to see if she has any problems with stomach emptying. Thanks.

## 2016-10-13 NOTE — Telephone Encounter (Signed)
Last saw Anderson Malta, message routed to [place].

## 2016-10-14 NOTE — Telephone Encounter (Signed)
Pt appt moved to 10/21/16@2 :45pm, pt aware and appt in June cancelled.

## 2016-10-14 NOTE — Telephone Encounter (Signed)
Routed to [facility]

## 2016-10-21 ENCOUNTER — Encounter: Payer: Self-pay | Admitting: Internal Medicine

## 2016-10-21 ENCOUNTER — Ambulatory Visit (INDEPENDENT_AMBULATORY_CARE_PROVIDER_SITE_OTHER): Payer: Medicare Other | Admitting: Internal Medicine

## 2016-10-21 VITALS — BP 112/72 | HR 84 | Ht 62.0 in | Wt 104.0 lb

## 2016-10-21 DIAGNOSIS — R110 Nausea: Secondary | ICD-10-CM

## 2016-10-21 DIAGNOSIS — K219 Gastro-esophageal reflux disease without esophagitis: Secondary | ICD-10-CM | POA: Diagnosis not present

## 2016-10-21 DIAGNOSIS — R112 Nausea with vomiting, unspecified: Secondary | ICD-10-CM | POA: Diagnosis not present

## 2016-10-21 DIAGNOSIS — K222 Esophageal obstruction: Secondary | ICD-10-CM

## 2016-10-21 MED ORDER — ONDANSETRON HCL 4 MG PO TABS: 4.0000 mg | ORAL_TABLET | Freq: Three times a day (TID) | ORAL | 0 refills | Status: DC

## 2016-10-21 NOTE — Patient Instructions (Signed)
We have sent the following medications to your pharmacy for you to pick up at your convenience:  Zofran  Take as directed:  At bedtime, first thing in the morning, and before your evening meal.  Stay on your PPI  Please follow up in 3 months

## 2016-10-21 NOTE — Progress Notes (Signed)
HISTORY OF PRESENT ILLNESS:  [name] is a 76 y.o. female with complicated medical history on chronic anticoagulation therapy who presents today for follow-up regarding problems with nausea and vomiting. Problems have been present since last September. She has been seen in the GI clinic several occasions this year. She underwent colonoscopy and upper endoscopy in February. No significant abnormalities. Gastric emptying scan was normal. Currently on twice a day PPI. Zofran as needed. She is status post cholecystectomy. Has had adjustment in medications. History of anxiety and depression. Accompanied today by her husband. Upon careful questioning what is described is nausea upon waking with "gagging" behavior. No true vomiting. She does find in the afternoon with her meals and beverage. About 2-3 times per week she describes vomiting at some point after her meal. Generally the food stuff. Off amiodarone. Started back on Wellbutrin last month. Overall she does feel her symptoms have been improving. She has gained a few pounds. They had multiple questions and concerns  REVIEW OF SYSTEMS:  All non-GI ROS negative except for sinus and allergy, anxiety, arthritis, cough, fatigue,  Past Medical History:  Diagnosis Date  . Allergy    seasonal  . Anemia   . Anxiety   . Arthritis   . Asthma   . 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 Protecta  . Biventricular ICD (implantable cardioverter-defibrillator) in place    with CRT  . Blood transfusion without reported diagnosis   . Bursitis   . Cataract    RIGHT EYE  . CHF (congestive heart failure) ([place])   . Chronic kidney disease   . Colon polyp    adenomatous  . Complication of anesthesia    patient stated that had difficulty getting the breathing tube removed, patient said that she stopped breathing and HR dropped to 10  patient then woke up and started breathing pateint stated no longer than one minute;  re-intubated in PACU following cholecystectomy 10/28/13  . COPD (chronic obstructive pulmonary disease) ([place])   . Depression   . Diverticulosis   . Dysrhythmia   . Fibromyalgia   . GERD (gastroesophageal reflux disease)   . Gout   . H/O mitral valve replacement 2002, 2007  . Heart murmur   . Hemorrhoids   . Hyperlipidemia   . Hypertension   . Hypothyroidism   . IBS (irritable bowel syndrome)   . MVP (mitral valve prolapse)   . Neuromuscular disorder (HCC)    fibromyalgia  . Pacemaker   . Peptic ulcer disease     Past Surgical History:  Procedure Laterality Date  . ABDOMINAL HYSTERECTOMY    . CARDIAC CATHETERIZATION  02/25/2006   normal left main, normal LAD, normal L Cfx, normal/dominant RCA (Dr. [name])  . CARDIAC DEFIBRILLATOR PLACEMENT  2007, 11/202012   x2 (pacemaker) (Dr. Jerilynn Mages. Croitoru)  . CARDIAC VALVE REPLACEMENT  2002   MV repair - Dr. Berle Mull  . CHOLECYSTECTOMY N/A 10/28/2013   Procedure: LAPAROSCOPIC CHOLECYSTECTOMY WITH INTRAOPERATIVE CHOLANGIOGRAM;  Surgeon: Edward Jolly, MD;  Location: [facility];  Service: General;  Laterality: N/A;  . COLONOSCOPY    . EYE SURGERY    . IMPLANTABLE CARDIOVERTER DEFIBRILLATOR (ICD) GENERATOR CHANGE N/A 04/10/2011   Procedure: ICD GENERATOR CHANGE;  Surgeon: Sanda Klein, MD;  Location: [facility] CATH LAB;  Service: Cardiovascular;  Laterality: N/A;  . KNEE ARTHROSCOPY    . MITRAL VALVE REPLACEMENT  02/26/2006   re-do MVR w/83m St. [name] (Dr. [facility]ison Hughs  [facility]  PER[facility]  2005   persantine myoview - low ris, EF 63%  . RIGHT HEART CATH  04/03/2006   pulm cap wedge pressure 24/24, PA pressure 43/22 (mean 29mHg), CO 4.8, CI 4.1 (Dr. JJackie Plum  . TOTAL SHOULDER ARTHROPLASTY Right 12/27/2015   Procedure: RIGHT TOTAL SHOULDER ARTHROPLASTY;  Surgeon: JTania Ade MD;  [place] [facility]  Service: Orthopedics;  Laterality: Right;  Right total shoulder arthroplasty  . TRANSTHORACIC ECHOCARDIOGRAM  12/2011   EF 50-55%, mild  global hypokinesis; LA severely dilated; calcification of anterior/posterior MV leaflets, bi-leaflets St. [name] mechanical MV; mild TR; trace AV regurg/pulm valve regurg    Social History HSELINE ENZOR reports that she quit smoking about 15 years ago. Her smoking use included Cigarettes. She has never used smokeless tobacco. She reports that she drinks alcohol. She reports that she does not use drugs.  family history includes Breast cancer in her sister; Crohn's disease in her sister; Heart attack in her father and mother; Heart disease in her father, mother, sister, and son; Kidney disease in her father; Uterine cancer in her sister.  Allergies  Allergen Reactions  . Ace Inhibitors Cough  . Lipitor [Atorvastatin Calcium] Other (See Comments)    Knee pain.  . Codeine Nausea And Vomiting       PHYSICAL EXAMINATION: Vital signs: BP 112/72   Pulse 84   Ht 5' 2"  (1.575 m)   Wt 104 lb (47.2 kg)   BMI 19.02 kg/m   Constitutional: generally well-appearing, no acute distress Psychiatric: alert and oriented x3, cooperative Eyes: extraocular movements intact, anicteric, conjunctiva pink Mouth: oral pharynx moist, no lesions Neck: supple no lymphadenopathy Cardiovascular: heart regular rate and rhythm, no murmur Lungs: clear to auscultation bilaterally Abdomen: soft, nontender, nondistended, no obvious ascites, no peritoneal signs, normal bowel sounds, no organomegaly Rectal: Omitted Extremities: no clubbing cyanosis or lower extremity edema bilaterally Skin: no lesions on visible extremities Neuro: No focal deficits. Cranial nerves intact  ASSESSMENT:  #1. Nausea with gagging behavior and occasional vomiting. Negative extensive GI evaluation. Suspect functional or medication related. Discussed with them in detail the differential diagnosis of her symptom complex. #2. GERD with peptic stricture. Status post EGD with esophageal dilation in February #3. History of peptic ulcer disease.  No evidence for active disease currently #4. Status post cholecystectomy #5. History of adenomatous colon polyps. Surveillance up-to-date. Last examination February 2018. Negative for neoplasia   PLAN:  #1. Recommend continuing twice daily PPI for now #2. Recommend taking Zofran first thing in the morning, before evening meal, and at that time on a regular basis. May titrate back if doing well #3. Hoping Wellbutrin therapy will help functional symptoms #4. Ongoing general medical care with PCP and other specialists #5. Routine GI follow-up 3 months  25 minutes was spent face-to-face with the patient. Greater than 50% a time she is for counseling regarding her chronic nausea with gagging and intermittent vomiting. Also answering multiple questions for the patient and her husband

## 2016-10-30 ENCOUNTER — Telehealth: Payer: Self-pay | Admitting: Internal Medicine

## 2016-10-30 NOTE — Telephone Encounter (Signed)
I am sorry that she is still having trouble. I'm not sure that there is a primary GI cause for her symptom complex. She has been thoroughly evaluated. Her Symptoms are atypical with functional component at least. At this point would be best for her to return to her PCP to assess for non-GI causes for her symptoms

## 2016-10-30 NOTE — Telephone Encounter (Signed)
Pt states that now she is throwing up in the morning before she tries to eat breakfast. States she is taking the zofran as recommended and the PPI. Asked pt if she was taking the welbutrin and she states she is but she did not take it yesterday or today because she thinks she would just throw it back up. Pt states everything she vomits is just liquid, reports yesterday she was only able to eat a little chicken noodle soup, water and half a waffle. Today just some water and sprite. Pt wants to know if there is anything else she can do. Please advise.

## 2016-10-30 NOTE — Telephone Encounter (Signed)
Spoke with pt and she is aware.

## 2016-11-03 ENCOUNTER — Ambulatory Visit (INDEPENDENT_AMBULATORY_CARE_PROVIDER_SITE_OTHER): Payer: Medicare Other | Admitting: Pharmacist Clinician (PhC)/ Clinical Pharmacy Specialist

## 2016-11-03 DIAGNOSIS — Z7901 Long term (current) use of anticoagulants: Secondary | ICD-10-CM | POA: Diagnosis not present

## 2016-11-03 DIAGNOSIS — Z952 Presence of prosthetic heart valve: Secondary | ICD-10-CM

## 2016-11-03 LAB — POCT INR: INR: 3.1

## 2016-11-11 ENCOUNTER — Ambulatory Visit: Payer: Medicare Other | Admitting: Internal Medicine

## 2016-11-13 ENCOUNTER — Other Ambulatory Visit: Payer: Self-pay

## 2016-11-13 MED ORDER — SPIRONOLACTONE 25 MG PO TABS: 25.0000 mg | ORAL_TABLET | Freq: Every day | ORAL | 3 refills | Status: DC

## 2016-11-14 ENCOUNTER — Other Ambulatory Visit: Payer: Self-pay

## 2016-11-14 MED ORDER — PANTOPRAZOLE SODIUM 40 MG PO TBEC: 40.0000 mg | DELAYED_RELEASE_TABLET | Freq: Two times a day (BID) | ORAL | 3 refills | Status: DC

## 2016-11-23 ENCOUNTER — Other Ambulatory Visit: Payer: Self-pay | Admitting: Cardiovascular Disease

## 2016-11-24 NOTE — Telephone Encounter (Signed)
Rx(s) sent to pharmacy electronically.  

## 2016-12-01 ENCOUNTER — Ambulatory Visit (INDEPENDENT_AMBULATORY_CARE_PROVIDER_SITE_OTHER): Payer: Medicare Other | Admitting: Pharmacist

## 2016-12-01 DIAGNOSIS — Z952 Presence of prosthetic heart valve: Secondary | ICD-10-CM | POA: Diagnosis not present

## 2016-12-01 DIAGNOSIS — Z7901 Long term (current) use of anticoagulants: Secondary | ICD-10-CM | POA: Diagnosis not present

## 2016-12-01 LAB — POCT INR: INR: 1.3

## 2016-12-10 ENCOUNTER — Ambulatory Visit (INDEPENDENT_AMBULATORY_CARE_PROVIDER_SITE_OTHER): Payer: Medicare Other | Admitting: Pharmacist

## 2016-12-10 DIAGNOSIS — Z952 Presence of prosthetic heart valve: Secondary | ICD-10-CM | POA: Diagnosis not present

## 2016-12-10 DIAGNOSIS — Z7901 Long term (current) use of anticoagulants: Secondary | ICD-10-CM | POA: Diagnosis not present

## 2016-12-10 LAB — POCT INR: INR: 2.4

## 2016-12-22 ENCOUNTER — Encounter: Payer: Medicare Other | Admitting: *Deleted

## 2016-12-26 ENCOUNTER — Encounter: Payer: Self-pay | Admitting: Cardiology

## 2016-12-31 ENCOUNTER — Ambulatory Visit (INDEPENDENT_AMBULATORY_CARE_PROVIDER_SITE_OTHER): Payer: Medicare Other | Admitting: Pharmacist Clinician (PhC)/ Clinical Pharmacy Specialist

## 2016-12-31 DIAGNOSIS — Z952 Presence of prosthetic heart valve: Secondary | ICD-10-CM

## 2016-12-31 DIAGNOSIS — Z7901 Long term (current) use of anticoagulants: Secondary | ICD-10-CM

## 2016-12-31 LAB — POCT INR: INR: 2.6

## 2017-01-21 ENCOUNTER — Ambulatory Visit (INDEPENDENT_AMBULATORY_CARE_PROVIDER_SITE_OTHER): Payer: Medicare Other | Admitting: Pharmacist Clinician (PhC)/ Clinical Pharmacy Specialist

## 2017-01-21 DIAGNOSIS — Z952 Presence of prosthetic heart valve: Secondary | ICD-10-CM | POA: Diagnosis not present

## 2017-01-21 DIAGNOSIS — Z7901 Long term (current) use of anticoagulants: Secondary | ICD-10-CM | POA: Diagnosis not present

## 2017-01-21 LAB — POCT INR: INR: 3.5

## 2017-02-17 ENCOUNTER — Other Ambulatory Visit: Payer: Self-pay | Admitting: Internal Medicine

## 2017-02-17 DIAGNOSIS — Z1231 Encounter for screening mammogram for malignant neoplasm of breast: Secondary | ICD-10-CM

## 2017-02-20 ENCOUNTER — Ambulatory Visit (INDEPENDENT_AMBULATORY_CARE_PROVIDER_SITE_OTHER): Payer: Medicare Other | Admitting: Pharmacist

## 2017-02-20 DIAGNOSIS — Z7901 Long term (current) use of anticoagulants: Secondary | ICD-10-CM | POA: Diagnosis not present

## 2017-02-20 DIAGNOSIS — Z952 Presence of prosthetic heart valve: Secondary | ICD-10-CM

## 2017-02-20 LAB — POCT INR: INR: 3.1

## 2017-02-23 ENCOUNTER — Other Ambulatory Visit: Payer: Self-pay | Admitting: Cardiovascular Disease

## 2017-03-20 ENCOUNTER — Ambulatory Visit (INDEPENDENT_AMBULATORY_CARE_PROVIDER_SITE_OTHER): Payer: Medicare Other | Admitting: Pharmacist Clinician (PhC)/ Clinical Pharmacy Specialist

## 2017-03-20 DIAGNOSIS — Z952 Presence of prosthetic heart valve: Secondary | ICD-10-CM

## 2017-03-20 DIAGNOSIS — Z7901 Long term (current) use of anticoagulants: Secondary | ICD-10-CM

## 2017-03-20 LAB — POCT INR: INR: 2.8

## 2017-03-23 ENCOUNTER — Telehealth: Payer: Self-pay

## 2017-03-23 NOTE — Telephone Encounter (Signed)
Spoke with pt regarding device reaching ERI, informed pt that she had 3 months to get her device changed out and that someone form Dr. Loletha Grayer office will be calling to schedule an appoint to discuss gen change. Pt voiced understanding.

## 2017-03-25 ENCOUNTER — Telehealth: Payer: Self-pay | Admitting: Cardiovascular Disease

## 2017-03-25 NOTE — Telephone Encounter (Signed)
Closed encounter °

## 2017-03-30 ENCOUNTER — Ambulatory Visit (INDEPENDENT_AMBULATORY_CARE_PROVIDER_SITE_OTHER): Payer: Medicare Other | Admitting: Cardiovascular Disease

## 2017-03-30 ENCOUNTER — Encounter: Payer: Self-pay | Admitting: Cardiovascular Disease

## 2017-03-30 ENCOUNTER — Ambulatory Visit
Admission: RE | Admit: 2017-03-30 | Discharge: 2017-03-30 | Disposition: A | Discharge status: Home / Self Care | Payer: Medicare Other | Source: Ambulatory Visit | Attending: Internal Medicine | Admitting: Internal Medicine

## 2017-03-30 VITALS — BP 128/73 | HR 68 | Ht 62.0 in | Wt 103.0 lb

## 2017-03-30 DIAGNOSIS — I480 Paroxysmal atrial fibrillation: Secondary | ICD-10-CM | POA: Diagnosis not present

## 2017-03-30 DIAGNOSIS — N183 Chronic kidney disease, stage 3 unspecified: Secondary | ICD-10-CM

## 2017-03-30 DIAGNOSIS — M19012 Primary osteoarthritis, left shoulder: Secondary | ICD-10-CM

## 2017-03-30 DIAGNOSIS — Z4502 Encounter for adjustment and management of automatic implantable cardiac defibrillator: Secondary | ICD-10-CM | POA: Diagnosis not present

## 2017-03-30 DIAGNOSIS — Z7901 Long term (current) use of anticoagulants: Secondary | ICD-10-CM

## 2017-03-30 DIAGNOSIS — E440 Moderate protein-calorie malnutrition: Secondary | ICD-10-CM

## 2017-03-30 DIAGNOSIS — Z952 Presence of prosthetic heart valve: Secondary | ICD-10-CM

## 2017-03-30 DIAGNOSIS — Z01818 Encounter for other preprocedural examination: Secondary | ICD-10-CM

## 2017-03-30 DIAGNOSIS — Z1231 Encounter for screening mammogram for malignant neoplasm of breast: Secondary | ICD-10-CM

## 2017-03-30 DIAGNOSIS — I5032 Chronic diastolic (congestive) heart failure: Secondary | ICD-10-CM | POA: Diagnosis not present

## 2017-03-30 NOTE — Progress Notes (Signed)
Cardiology Office Note    Date:  03/30/2017   ID:  [name], DOB [birth year], MRN [id number]  PCP:  Kathleen Battles, MD  Cardiologist:  [name], M.D.; Kathleen Klein, MD   Chief Complaint  Patient presents with  Pacemaker at ERI    History of Present Illness:  [name] is a 76 y.o. female here for follow-up on her biventricular pacemaker/defibrillator and chronic heart failure.  Her dual-chamber biventricular defibrillator reached ERI on March 23, 2017.  It is otherwise functioning normally.  There have been no episodes of atrial fibrillation or ventricular tachycardia.  She has 28% atrial pacing and 97.7% biventricular pacing.  Her thoracic impedance (optivol) is normal.  Over the last year she has had substantial weight loss due to GI problems.  She had intractable nausea and vomiting for several months.  Starting in April vomiting has resolved but she still has to force himself to swallow repeatedly to keep anything down.  Her weight dipped to the mid 90s but has rebounded a little bit 203 pounds.  She is clearly underweight with a BMI under 19.  She has seen 2 different gastroenterologist and had endoscopy and colonoscopy without a clear cause being identified.  She has not had any cardiovascular problems.  She specifically denies dyspnea at rest or with exertion, chest pain, palpitations, leg edema, syncope or neurological events.  She has not had serious bleeding or any clotting problems.  Last year she underwent shoulder replacement surgery on the right side.  A neighbor helped her with administration of enoxaparin.  She is on chronic warfarin anticoagulation for mitral valve replacement with a mechanical prosthesis and atrial fibrillation.   Echo performed March 18 showed left ventricular ejection fraction of 45-50% and normal function of the mitral valve prosthesis.  [name] has a long-standing history of valvular heart disease, undergoing mitral valve  repair in 2002, followed by a mitral valve replacement with a mechanical prosthesis in 2007. Left ventricular ejection fraction which has been as low as 10% improved substantially after implantation of the CRT-D device and now her LVEF is near normal. She received her initial CRT-D in 2008 and underwent a generator change out in 2012. She has not received VT therapy. She has close to 100% biventricular pacing efficiency. She has a Riata 7 [place] ICD lead under advisory, which appeared fluoroscopically normal at the time of for generator change and which still has normal parameters. Did not have any angiographic CAD prior to her surgery.   Past Medical History:  Diagnosis Date  . Allergy    seasonal  . Anemia   . Anxiety   . Arthritis   . Asthma   . 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 Protecta  . Biventricular ICD (implantable cardioverter-defibrillator) in place    with CRT  . Blood transfusion without reported diagnosis   . Bursitis   . Cataract    RIGHT EYE  . CHF (congestive heart failure) ([place])   . Chronic kidney disease   . Colon polyp    adenomatous  . Complication of anesthesia    patient stated that had difficulty getting the breathing tube removed, patient said that she stopped breathing and HR dropped to 10  patient then woke up and started breathing pateint stated no longer than one minute; re-intubated in PACU following cholecystectomy 10/28/13  . COPD (chronic obstructive pulmonary disease) ([place])   . Depression   . Diverticulosis   . Dysrhythmia   .  Fibromyalgia   . GERD (gastroesophageal reflux disease)   . Gout   . H/O mitral valve replacement 2002, 2007  . Heart murmur   . Hemorrhoids   . Hyperlipidemia   . Hypertension   . Hypothyroidism   . IBS (irritable bowel syndrome)   . MVP (mitral valve prolapse)   . Neuromuscular disorder (HCC)    fibromyalgia  . Pacemaker   . Peptic ulcer disease     Past Surgical History:    Procedure Laterality Date  . ABDOMINAL HYSTERECTOMY    . CARDIAC CATHETERIZATION  02/25/2006   normal left main, normal LAD, normal L Cfx, normal/dominant RCA (Dr. Adora [name])  . CARDIAC DEFIBRILLATOR PLACEMENT  2007, 11/202012   x2 (pacemaker) (Dr. Jerilynn Mages. [name])  . CARDIAC VALVE REPLACEMENT  2002   MV repair - Dr. Berle [name]  . CHOLECYSTECTOMY N/A 10/28/2013   Procedure: LAPAROSCOPIC CHOLECYSTECTOMY WITH INTRAOPERATIVE CHOLANGIOGRAM;  Surgeon: Kathleen Jolly, MD;  Location: [facility];  Service: General;  Laterality: N/A;  . COLONOSCOPY    . EYE SURGERY    . IMPLANTABLE CARDIOVERTER DEFIBRILLATOR (ICD) GENERATOR CHANGE N/A 04/10/2011   Procedure: ICD GENERATOR CHANGE;  Surgeon: Kathleen Klein, MD;  Location: [facility] CATH [facility];  Service: Cardiovascular;  Laterality: N/A;  . KNEE ARTHROSCOPY    . MITRAL VALVE REPLACEMENT  02/26/2006   re-do MVR w/59m St. [name] (Dr. BEllison [name]  . NM MYOCAR PERF WALL MOTION  2005   persantine myoview - low ris, EF 63%  . RIGHT HEART CATH  04/03/2006   pulm cap wedge pressure 24/24, PA pressure 43/22 (mean 324mg), CO 4.8, CI 4.1 ([name] . TOTAL SHOULDER ARTHROPLASTY Right 12/27/2015   Procedure: RIGHT TOTAL SHOULDER ARTHROPLASTY;  Surgeon: [name];  [facility] Service: Orthopedics;  Laterality: Right;  Right total shoulder arthroplasty  . TRANSTHORACIC ECHOCARDIOGRAM  12/2011   EF 50-55%, mild global hypokinesis; LA severely dilated; calcification of anterior/posterior MV leaflets, bi-leaflets St. [name] mechanical MV; mild TR; trace AV regurg/pulm valve regurg    Current Medications: Outpatient Medications Prior to Visit  Medication Sig Dispense Refill  . allopurinol (ZYLOPRIM) 300 MG tablet Take 300 mg by mouth every evening.     . [place] KitchenLPRAZolam (XANAX) 0.25 MG tablet Take 0.25 mg by mouth 3 (three) times daily as needed for anxiety.    . [place] Kitchenmoxicillin (AMOXIL) 500 MG capsule Take 4 capsules 1 hour prior to dental appointment. 4 capsule 2  .  buPROPion (WELLBUTRIN) 100 MG tablet Take 100 mg by mouth daily.  12  . furosemide (LASIX) 40 MG tablet TAKE 2 TABLETS BY MOUTH EVERY MORNING AND 1 TAB EVERY AFTERNOON 90 tablet 9  . hydrOXYzine (VISTARIL) 25 MG capsule Take 25 mg by mouth at bedtime.    . metoprolol tartrate (LOPRESSOR) 25 MG tablet Take 25 mg by mouth daily.     . Multiple Vitamins-Minerals (MULTIVITAMINS THER. W/MINERALS) TABS Take 1 tablet by mouth every morning.     . ondansetron (ZOFRAN) 4 MG tablet Take 1 tablet (4 mg total) by mouth 3 (three) times daily. 90 tablet 0  . pantoprazole (PROTONIX) 40 MG tablet Take 1 tablet (40 mg total) by mouth 2 (two) times daily before a meal. 180 tablet 3  . pravastatin (PRAVACHOL) 40 MG tablet Take 40 mg by mouth daily.    . Mar[place] KitchenROAIR HFA 108 (90 BASE) MCG/ACT inhaler Inhale 1-2 puffs into the lungs every 6 (six) hours as needed for wheezing.     .[name]  Sennosides (SENNA) 15 MG TABS Take 1 tablet by mouth every other day.    . spironolactone (ALDACTONE) 25 MG tablet Take 1 tablet (25 mg total) by mouth daily. 90 tablet 3  . SYNTHROID 50 MCG tablet Take 50 mcg by mouth daily.  2  . warfarin (COUMADIN) 2.5 MG tablet TAKE 1 TABLETS BY MOUTH DAILY OR AS DIRECTED BY COUMADIN CLINIC 90 tablet 1  . AMBULATORY NON FORMULARY MEDICATION FD guard, one three times a day after meals     Facility-Administered Medications Prior to Visit  Medication Dose Route Frequency Provider Last Rate Last Dose  . 0.9 %  sodium chloride infusion  500 mL Intravenous Continuous Irene Shipper, MD         Allergies:   Ace inhibitors; Lipitor [atorvastatin calcium]; and Codeine   Social History   Social History  . Marital status: Married    Spouse name: [name]  . Number of children: 4  . Years of education: 12   Occupational History  . retired    Social History Main Topics  . Smoking status: Former Smoker    Types: Cigarettes    Quit date: 06/02/2001  . Smokeless tobacco: Never Used  . Alcohol use 0.0 oz/week      Comment: social-1 every 6 months  . Drug use: No  . Sexual activity: Not Asked   Other Topics Concern  . None   Social History Narrative   Drinks very little caffeine      Family History:  The patient's family history includes Breast cancer in her sister; Crohn's disease in her sister; Heart attack in her father and mother; Heart disease in her father, mother, sister, and son; Kidney disease in her father; Uterine cancer in her sister.   ROS:   Please see the history of present illness.    ROS All other systems reviewed and are negative.   PHYSICAL EXAM:   VS:  BP 128/73   Pulse 68   Ht 5' 2"  (1.575 m)   Wt 103 lb (46.7 kg)   BMI 18.84 kg/m      General: Alert, oriented x3, no distress, underweight Head: no evidence of trauma, PERRL, EOMI, no exophtalmos or lid lag, no myxedema, no xanthelasma; normal ears, nose and oropharynx Neck: normal jugular venous pulsations and no hepatojugular reflux; brisk carotid pulses without delay and no carotid bruits Chest: clear to auscultation, no signs of consolidation by percussion or palpation, normal fremitus, symmetrical and full respiratory excursions Cardiovascular:  Crisp mechanical valve clicks, RRR; no murmurs, rubs, or gallops,no edema, healthy left subclavian ICD site  Abdomen: no tenderness or distention, no masses by palpation, no abnormal pulsatility or arterial bruits, normal bowel sounds, no hepatosplenomegaly Extremities: no clubbing, cyanosis or edema; 2+ radial, ulnar and brachial pulses bilaterally; 2+ right femoral, posterior tibial and dorsalis pedis pulses; 2+ left femoral, posterior tibial and dorsalis pedis pulses; no subclavian or femoral bruits Neurological: grossly nonfocal Psych: Normal mood and affect    Wt Readings from Last 3 Encounters:  03/30/17 103 lb (46.7 kg)  10/21/16 104 lb (47.2 kg)  09/26/16 107 lb (48.5 kg)      Studies/Labs Reviewed:   EKG:  EKG is ordered today.  Shows atrial paced,  biventricular paced rhythm, QTC 518 ms  Recent Labs: No results found for requested labs within last 8760 hours.    ASSESSMENT:    1. Chronic diastolic heart failure ([place])   2. ICD (implantable cardioverter-defibrillator) battery depletion  3. History of mitral valve replacement with mechanical valve   4. Paroxysmal atrial fibrillation (HCC)   5. Long term current use of anticoagulant therapy   6. Malnutrition of moderate degree ([place]: 60% to less than 75% of standard weight) ([place])   7. Arthropathy of left shoulder   8. CKD (chronic kidney disease) stage 3, GFR 30-59 ml/min (HCC)   9. Preprocedural examination      PLAN:  In order of problems listed above:  1. CHF:  appears clinically euvolemic, asymptomatic from a cardiovascular standpoint.  Echo in March 2018 showed EF of 45%. 2. CRT-D: Generator at ERI.  She has a St. [name] Riata lead that is functioning normally. She is not pacemaker dependent. The device was implanted for congestive heart failure due to dyssynchrony. She is a "hyper responder" with her LVEF increasing from 10% to near-normal with CRT (last echo 07/2016).  Plan generator change out, but do not think we need to revise her leads.  Will reevaluate the defibrillator lead with fluoroscopy.  Her anticoagulation does not need to be stopped for the generator change out, but need to ensure that she is not excessively anticoagulated.  Would like to coordinate her left shoulder replacement surgery with Dr. Tamera Punt.  It may be best to keep her in the hospital off oral anticoagulation and on IV heparin for both procedures. 3. S/P MVR mechanical prosthesis: Normal function of the prosthesis by her recent echo. 4. AFib: Has not recurred even though we stopped her low-dose amiodarone completely 6 months ago.  It was around the same time that her actual vomiting disappeared, but she still has prominent GI symptoms in the amiodarone is probably completely out of her system by  now 5. Warfarin: She did okay with self-administered enoxaparin (a neighbor actually helped) for her other shoulder procedure, but she has lost a lot of weight since that time.  I am rather concerned with a less predictable effects of enoxaparin.  I think it is safer to use intravenous heparin, although this will be more cumbersome logistically. 6. Malnutrition: Substantial weight loss, now stabilized, but still with anorexia. 7. L shoulder arthritis: needs a shoulder replacement.  Will plan to do this after we change out her ICD.  Due to the proximity of her device to the surgical site, will have to turn therapies off via programming.  We will try to coordinate with her orthopedic surgeon, Dr. Tamera Punt. 8. CKD 3: She told me that when she last saw her nephrologist about 4 months ago he told her that her kidney function was "fantastic".  The most recent labs I have available from January show a creatinine of 1.5 and an albumin of 4.6.   Medication Adjustments/Labs and Tests Ordered: Current medicines are reviewed at length with the patient today.  Concerns regarding medicines are outlined above.  Medication changes, Labs and Tests ordered today are listed in the Patient Instructions below. Patient Instructions  Dr Sallyanne Kuster recommends that you have your ICD generator changed out. Please see attached letter and materials for instructions and appointment details.    Signed, Kathleen Klein, MD  03/30/2017 6:15 PM    [facility] [address], [place], [place]  [address] Phone: [phone]; Fax: [phone]

## 2017-03-30 NOTE — Patient Instructions (Signed)
Dr Sallyanne Kuster recommends that you have your ICD generator changed out. Please see attached letter and materials for instructions and appointment details.

## 2017-04-03 ENCOUNTER — Other Ambulatory Visit: Payer: Self-pay | Admitting: Orthopedic Surgery

## 2017-04-03 DIAGNOSIS — M19012 Primary osteoarthritis, left shoulder: Secondary | ICD-10-CM

## 2017-04-07 ENCOUNTER — Other Ambulatory Visit: Payer: Self-pay | Admitting: Orthopedic Surgery

## 2017-04-09 ENCOUNTER — Telehealth: Payer: Self-pay

## 2017-04-09 ENCOUNTER — Ambulatory Visit
Admission: RE | Admit: 2017-04-09 | Discharge: 2017-04-09 | Disposition: A | Discharge status: Home / Self Care | Payer: Medicare Other | Source: Ambulatory Visit | Attending: Orthopedic Surgery | Admitting: Orthopedic Surgery

## 2017-04-09 ENCOUNTER — Other Ambulatory Visit: Payer: Self-pay

## 2017-04-09 DIAGNOSIS — M19012 Primary osteoarthritis, left shoulder: Secondary | ICD-10-CM

## 2017-04-09 LAB — CBC
HEMATOCRIT: 34.5 % (ref 34.0–46.6)
Hemoglobin: 11.6 g/dL (ref 11.1–15.9)
MCH: 30.9 pg (ref 26.6–33.0)
MCHC: 33.6 g/dL (ref 31.5–35.7)
MCV: 92 fL (ref 79–97)
PLATELETS: 283 10*3/uL (ref 150–379)
RBC: 3.76 x10E6/uL — AB (ref 3.77–5.28)
RDW: 16.4 % — ABNORMAL HIGH (ref 12.3–15.4)
WBC: 8.1 10*3/uL (ref 3.4–10.8)

## 2017-04-09 LAB — BASIC METABOLIC PANEL
BUN/Creatinine Ratio: 13 (ref 12–28)
BUN: 25 mg/dL (ref 8–27)
CHLORIDE: 94 mmol/L — AB (ref 96–106)
CO2: 28 mmol/L (ref 20–29)
CREATININE: 1.93 mg/dL — AB (ref 0.57–1.00)
Calcium: 10 mg/dL (ref 8.7–10.3)
GFR calc Af Amer: 29 mL/min/{1.73_m2} — ABNORMAL LOW (ref >59)
GFR calc non Af Amer: 25 mL/min/{1.73_m2} — ABNORMAL LOW (ref >59)
GLUCOSE: 112 mg/dL — AB (ref 65–99)
Potassium: 4.3 mmol/L (ref 3.5–5.2)
SODIUM: 137 mmol/L (ref 134–144)

## 2017-04-09 LAB — PROTIME-INR
INR: 2.4 — ABNORMAL HIGH (ref 0.8–1.2)
Prothrombin Time: 23.9 s — ABNORMAL HIGH (ref 9.1–12.0)

## 2017-04-09 NOTE — Telephone Encounter (Signed)
Spoke with pt, aware of dr croitoru's recommendations.

## 2017-04-09 NOTE — Telephone Encounter (Signed)
Patient walked in to clinic yesterday. Dr Deterding stopped her Furosemide.   Discussed with Dr C. Recommendations as follows: Weigh daily. Call [phone] if weight climbs more than 3 pounds in a day or 5 pounds in a week. Call if increased shortness of breath or increased swelling.  lmtcb

## 2017-04-09 NOTE — Telephone Encounter (Signed)
Kidney function did deteriorate substantially on last labs so we definitely need to reduce the diuretic. Agree to use 40 mg daily - she needs to weigh daily and report weight gain as described below. MCr

## 2017-04-09 NOTE — Telephone Encounter (Signed)
Spoke with pt, aware of dr croitoru's recommendations. Patient reports dr deterding, the kidney doctor, wants her to decrease furosemide to 40 mg once daily. She is currently taking 40 mg tid, she will not change until dr c says it is okay. Will forward to dr c review and advise

## 2017-04-09 NOTE — Telephone Encounter (Signed)
folllow up     Pt is returning your call about her medication

## 2017-04-09 NOTE — Telephone Encounter (Signed)
Opened in error

## 2017-04-17 ENCOUNTER — Ambulatory Visit (INDEPENDENT_AMBULATORY_CARE_PROVIDER_SITE_OTHER): Payer: Medicare Other | Admitting: Pharmacist

## 2017-04-17 DIAGNOSIS — Z7901 Long term (current) use of anticoagulants: Secondary | ICD-10-CM

## 2017-04-17 DIAGNOSIS — Z952 Presence of prosthetic heart valve: Secondary | ICD-10-CM | POA: Diagnosis not present

## 2017-04-17 LAB — POCT INR: INR: 2.3

## 2017-04-27 ENCOUNTER — Ambulatory Visit (INDEPENDENT_AMBULATORY_CARE_PROVIDER_SITE_OTHER): Payer: Medicare Other | Admitting: Pharmacist

## 2017-04-27 ENCOUNTER — Other Ambulatory Visit (HOSPITAL_COMMUNITY): Payer: Self-pay | Admitting: *Deleted

## 2017-04-27 DIAGNOSIS — Z7901 Long term (current) use of anticoagulants: Secondary | ICD-10-CM

## 2017-04-27 DIAGNOSIS — Z952 Presence of prosthetic heart valve: Secondary | ICD-10-CM

## 2017-04-27 LAB — POCT INR: INR: 2

## 2017-04-27 NOTE — Pre-Procedure Instructions (Signed)
[name]  04/27/2017    Your procedure is scheduled on Thursday, April 30, 2017 at 10:00 AM.   Report to [facility] Entrance "A" Admitting Office at 9:00 AM.   Call this number if you have problems the morning of surgery: [phone]   Questions prior to day of surgery, please call [phone] between 8 & 4 PM.   Remember:  Do not eat food or drink liquids after midnight Wednesday, 04/29/17.  Take these medicines the morning of surgery with A SIP OF WATER: Bupropion (Wellbutrin), Metoprolol (Lopressor), Ranitidine (Zantac), Synthroid, Alprazolam (Xanax) - if needed, Pro Air inhaler - if needed (bring inhaler with you day of surgery)  Stop Coumadin as instructed by physician/surgeon. Stop Multivitamins and NSAIDS (Ibuprofen, Aleve, etc) as of today.   Do not wear jewelry, make-up or nail polish.  Do not wear lotions, powders, perfumes or deodorant.  Do not shave 48 hours prior to surgery.    Do not bring valuables to the hospital.  [facility] is not responsible for any belongings or valuables.  Contacts, dentures or bridgework may not be worn into surgery.  Leave your suitcase in the car.  After surgery it may be brought to your room.  For patients admitted to the hospital, discharge time will be determined by your treatment team.  [facility] - Preparing for Surgery  Before surgery, you can play an important role.  Because skin is not sterile, your skin needs to be as free of germs as possible.  You can reduce the number of germs on you skin by washing with CHG (chlorahexidine gluconate) soap before surgery.  CHG is an antiseptic cleaner which kills germs and bonds with the skin to continue killing germs even after washing.  Please DO NOT use if you have an allergy to CHG or antibacterial soaps.  If your skin becomes reddened/irritated stop using the CHG and inform your nurse when you arrive at Short Stay.  Do not shave (including legs and underarms) for at  least 48 hours prior to the first CHG shower.  You may shave your face.  Please follow these instructions carefully:   1.  Shower with CHG Soap the night before surgery and the                    morning of Surgery.  2.  If you choose to wash your hair, wash your hair first as usual with your       normal shampoo.  3.  After you shampoo, rinse your hair and body thoroughly to remove the shampoo.  4.  Use CHG as you would any other liquid soap.  You can apply chg directly       to the skin and wash gently with scrungie or a clean washcloth.  5.  Apply the CHG Soap to your body ONLY FROM THE NECK DOWN.        Do not use on open wounds or open sores.  Avoid contact with your eyes, ears, mouth and genitals (private parts).  Wash genitals (private parts) with your normal soap.  6.  Wash thoroughly, paying special attention to the area where your surgery        will be performed.  7.  Thoroughly rinse your body with warm water from the neck down.  8.  DO NOT shower/wash with your normal soap after using and rinsing off       the CHG Soap.  9.  Pat yourself dry with a clean towel.            10.  Wear clean pajamas.            11.  Place clean sheets on your bed the night of your first shower and do not        sleep with pets.  Day of Surgery  Shower as above. Do not apply any lotions/deodorants the morning of surgery.  Please wear clean clothes to the hospital.   Please read over the fact sheets that you were given.

## 2017-04-27 NOTE — Patient Instructions (Signed)
HOLD warfarin dose today , admission to hospital tomorrow at 8am (will start heparin drip on admission per DR C orders). Call clinic after discharge from hospital for f/u appointment and instructions - [phone]

## 2017-04-28 ENCOUNTER — Encounter (HOSPITAL_COMMUNITY)
Admission: RE | Admit: 2017-04-28 | Discharge: 2017-04-28 | Disposition: A | Discharge status: Home / Self Care | Payer: Medicare Other | Source: Ambulatory Visit | Attending: Surgery | Admitting: Surgery

## 2017-04-28 ENCOUNTER — Inpatient Hospital Stay (HOSPITAL_COMMUNITY)
Admission: RE | Admit: 2017-04-28 | Discharge: 2017-05-07 | DRG: 245 | Disposition: A | Discharge status: Home / Self Care | Payer: Medicare Other | Source: Ambulatory Visit | Attending: Cardiovascular Disease | Admitting: Cardiovascular Disease

## 2017-04-28 DIAGNOSIS — Z95810 Presence of automatic (implantable) cardiac defibrillator: Secondary | ICD-10-CM | POA: Diagnosis not present

## 2017-04-28 DIAGNOSIS — I5042 Chronic combined systolic (congestive) and diastolic (congestive) heart failure: Secondary | ICD-10-CM | POA: Diagnosis present

## 2017-04-28 DIAGNOSIS — Z7901 Long term (current) use of anticoagulants: Secondary | ICD-10-CM | POA: Diagnosis not present

## 2017-04-28 DIAGNOSIS — I5032 Chronic diastolic (congestive) heart failure: Secondary | ICD-10-CM

## 2017-04-28 DIAGNOSIS — Z950 Presence of cardiac pacemaker: Secondary | ICD-10-CM

## 2017-04-28 DIAGNOSIS — I130 Hypertensive heart and chronic kidney disease with heart failure and stage 1 through stage 4 chronic kidney disease, or unspecified chronic kidney disease: Secondary | ICD-10-CM | POA: Diagnosis present

## 2017-04-28 DIAGNOSIS — Z87891 Personal history of nicotine dependence: Secondary | ICD-10-CM | POA: Diagnosis not present

## 2017-04-28 DIAGNOSIS — Z4502 Encounter for adjustment and management of automatic implantable cardiac defibrillator: Secondary | ICD-10-CM | POA: Diagnosis not present

## 2017-04-28 DIAGNOSIS — N179 Acute kidney failure, unspecified: Secondary | ICD-10-CM | POA: Diagnosis present

## 2017-04-28 DIAGNOSIS — Z8249 Family history of ischemic heart disease and other diseases of the circulatory system: Secondary | ICD-10-CM

## 2017-04-28 DIAGNOSIS — Z79890 Hormone replacement therapy (postmenopausal): Secondary | ICD-10-CM | POA: Diagnosis not present

## 2017-04-28 DIAGNOSIS — I5043 Acute on chronic combined systolic (congestive) and diastolic (congestive) heart failure: Secondary | ICD-10-CM

## 2017-04-28 DIAGNOSIS — K219 Gastro-esophageal reflux disease without esophagitis: Secondary | ICD-10-CM | POA: Diagnosis present

## 2017-04-28 DIAGNOSIS — I480 Paroxysmal atrial fibrillation: Secondary | ICD-10-CM | POA: Diagnosis present

## 2017-04-28 DIAGNOSIS — I5033 Acute on chronic diastolic (congestive) heart failure: Secondary | ICD-10-CM

## 2017-04-28 DIAGNOSIS — M797 Fibromyalgia: Secondary | ICD-10-CM | POA: Diagnosis present

## 2017-04-28 DIAGNOSIS — M19012 Primary osteoarthritis, left shoulder: Secondary | ICD-10-CM | POA: Diagnosis present

## 2017-04-28 DIAGNOSIS — E440 Moderate protein-calorie malnutrition: Secondary | ICD-10-CM | POA: Diagnosis present

## 2017-04-28 DIAGNOSIS — Z45010 Encounter for checking and testing of cardiac pacemaker pulse generator [battery]: Secondary | ICD-10-CM

## 2017-04-28 DIAGNOSIS — Z45018 Encounter for adjustment and management of other part of cardiac pacemaker: Principal | ICD-10-CM

## 2017-04-28 DIAGNOSIS — Z79899 Other long term (current) drug therapy: Secondary | ICD-10-CM

## 2017-04-28 DIAGNOSIS — Z01818 Encounter for other preprocedural examination: Secondary | ICD-10-CM

## 2017-04-28 DIAGNOSIS — E039 Hypothyroidism, unspecified: Secondary | ICD-10-CM | POA: Diagnosis present

## 2017-04-28 DIAGNOSIS — N183 Chronic kidney disease, stage 3 (moderate): Secondary | ICD-10-CM | POA: Diagnosis present

## 2017-04-28 DIAGNOSIS — Z952 Presence of prosthetic heart valve: Secondary | ICD-10-CM

## 2017-04-28 DIAGNOSIS — J449 Chronic obstructive pulmonary disease, unspecified: Secondary | ICD-10-CM | POA: Diagnosis present

## 2017-04-28 LAB — BASIC METABOLIC PANEL
ANION GAP: 8 (ref 5–15)
BUN: 19 mg/dL (ref 6–20)
CALCIUM: 9.7 mg/dL (ref 8.9–10.3)
CO2: 29 mmol/L (ref 22–32)
CREATININE: 1.42 mg/dL — AB (ref 0.44–1.00)
Chloride: 100 mmol/L — ABNORMAL LOW (ref 101–111)
GFR, EST AFRICAN AMERICAN: 41 mL/min — AB (ref >60)
GFR, EST NON AFRICAN AMERICAN: 35 mL/min — AB (ref >60)
Glucose, Bld: 77 mg/dL (ref 65–99)
Potassium: 4.2 mmol/L (ref 3.5–5.1)
SODIUM: 137 mmol/L (ref 135–145)

## 2017-04-28 LAB — CBC WITH DIFFERENTIAL/PLATELET
BASOS ABS: 0 10*3/uL (ref 0.0–0.1)
BASOS PCT: 0 %
EOS ABS: 0.1 10*3/uL (ref 0.0–0.7)
Eosinophils Relative: 2 %
HCT: 35.1 % — ABNORMAL LOW (ref 36.0–46.0)
HEMOGLOBIN: 11.2 g/dL — AB (ref 12.0–15.0)
Lymphocytes Relative: 11 %
Lymphs Abs: 0.7 10*3/uL (ref 0.7–4.0)
MCH: 30.4 pg (ref 26.0–34.0)
MCHC: 31.9 g/dL (ref 30.0–36.0)
MCV: 95.4 fL (ref 78.0–100.0)
MONOS PCT: 10 %
Monocytes Absolute: 0.7 10*3/uL (ref 0.1–1.0)
NEUTROS PCT: 77 %
Neutro Abs: 5.3 10*3/uL (ref 1.7–7.7)
Platelets: 249 10*3/uL (ref 150–400)
RBC: 3.68 MIL/uL — ABNORMAL LOW (ref 3.87–5.11)
RDW: 15.8 % — ABNORMAL HIGH (ref 11.5–15.5)
WBC: 6.8 10*3/uL (ref 4.0–10.5)

## 2017-04-28 LAB — PROTIME-INR
INR: 1.67
PROTHROMBIN TIME: 19.5 s — AB (ref 11.4–15.2)

## 2017-04-28 LAB — SURGICAL PCR SCREEN
MRSA, PCR: NEGATIVE
Staphylococcus aureus: NEGATIVE

## 2017-04-28 LAB — HEPARIN LEVEL (UNFRACTIONATED): Heparin Unfractionated: 0.16 IU/mL — ABNORMAL LOW (ref 0.30–0.70)

## 2017-04-28 MED ORDER — SODIUM CHLORIDE 0.9 % IV SOLN
INTRAVENOUS | Status: DC
  Administered 2017-04-29: 05:00:00 via INTRAVENOUS

## 2017-04-28 MED ORDER — METOPROLOL TARTRATE 25 MG PO TABS: 25.0000 mg | ORAL_TABLET | Freq: Every day | ORAL | Status: DC

## 2017-04-28 MED ORDER — CHLORHEXIDINE GLUCONATE 4 % EX LIQD
60.0000 mL | Freq: Once | CUTANEOUS | Status: AC
  Administered 2017-04-29: 4 via TOPICAL
  Filled 2017-04-28: qty 60

## 2017-04-28 MED ORDER — ALBUTEROL SULFATE (2.5 MG/3ML) 0.083% IN NEBU: 2.5000 mg | INHALATION_SOLUTION | Freq: Four times a day (QID) | RESPIRATORY_TRACT | Status: DC | PRN

## 2017-04-28 MED ORDER — SODIUM CHLORIDE 0.9 % IR SOLN
80.0000 mg | Status: AC
  Administered 2017-04-29: 80 mg

## 2017-04-28 MED ORDER — CEFAZOLIN SODIUM-DEXTROSE 2-4 GM/100ML-% IV SOLN
2.0000 g | INTRAVENOUS | Status: AC
  Administered 2017-04-29: 2 g via INTRAVENOUS

## 2017-04-28 MED ORDER — PRAVASTATIN SODIUM 40 MG PO TABS
40.0000 mg | ORAL_TABLET | Freq: Every day | ORAL | Status: DC
  Administered 2017-04-28 – 2017-05-07 (×9): 40 mg via ORAL
  Filled 2017-04-28 (×9): qty 1

## 2017-04-28 MED ORDER — ADULT MULTIVITAMIN W/MINERALS CH
1.0000 | ORAL_TABLET | Freq: Every day | ORAL | Status: DC
  Administered 2017-04-29 – 2017-05-07 (×9): 1 via ORAL
  Filled 2017-04-28 (×10): qty 1

## 2017-04-28 MED ORDER — HYDROXYZINE HCL 25 MG PO TABS
25.0000 mg | ORAL_TABLET | Freq: Every day | ORAL | Status: DC
  Administered 2017-04-28 – 2017-05-05 (×8): 25 mg via ORAL
  Filled 2017-04-28 (×8): qty 1

## 2017-04-28 MED ORDER — THERA M PLUS PO TABS: 1.0000 | ORAL_TABLET | Freq: Every morning | ORAL | Status: DC

## 2017-04-28 MED ORDER — CHLORHEXIDINE GLUCONATE 4 % EX LIQD
60.0000 mL | Freq: Once | CUTANEOUS | Status: AC
  Administered 2017-04-28: 4 via TOPICAL

## 2017-04-28 MED ORDER — HYDROXYZINE PAMOATE 25 MG PO CAPS
25.0000 mg | ORAL_CAPSULE | Freq: Every day | ORAL | Status: DC
  Filled 2017-04-28: qty 1

## 2017-04-28 MED ORDER — ALPRAZOLAM 0.25 MG PO TABS
0.2500 mg | ORAL_TABLET | Freq: Three times a day (TID) | ORAL | Status: DC | PRN
  Administered 2017-05-01 – 2017-05-02 (×2): 0.25 mg via ORAL
  Filled 2017-04-28 (×3): qty 1

## 2017-04-28 MED ORDER — METOPROLOL SUCCINATE ER 25 MG PO TB24
25.0000 mg | ORAL_TABLET | Freq: Every day | ORAL | Status: DC
  Administered 2017-04-29 – 2017-05-07 (×9): 25 mg via ORAL
  Filled 2017-04-28 (×9): qty 1

## 2017-04-28 MED ORDER — FAMOTIDINE 20 MG PO TABS
20.0000 mg | ORAL_TABLET | Freq: Every day | ORAL | Status: DC
  Administered 2017-04-29 – 2017-05-07 (×9): 20 mg via ORAL
  Filled 2017-04-28 (×9): qty 1

## 2017-04-28 MED ORDER — HEPARIN (PORCINE) IN NACL 100-0.45 UNIT/ML-% IJ SOLN
900.0000 [IU]/h | INTRAMUSCULAR | Status: DC
  Administered 2017-04-28: 700 [IU]/h via INTRAVENOUS
  Filled 2017-04-28: qty 250

## 2017-04-28 MED ORDER — SENNA 15 MG PO TABS: 1.0000 | ORAL_TABLET | Freq: Every day | ORAL | Status: DC

## 2017-04-28 MED ORDER — ONDANSETRON HCL 4 MG/2ML IJ SOLN: 4.0000 mg | Freq: Four times a day (QID) | INTRAMUSCULAR | Status: DC | PRN

## 2017-04-28 MED ORDER — ALBUTEROL SULFATE HFA 108 (90 BASE) MCG/ACT IN AERS: 1.0000 | INHALATION_SPRAY | Freq: Four times a day (QID) | RESPIRATORY_TRACT | Status: DC | PRN

## 2017-04-28 MED ORDER — BUPROPION HCL 100 MG PO TABS
100.0000 mg | ORAL_TABLET | Freq: Every day | ORAL | Status: DC
  Administered 2017-04-29 – 2017-05-07 (×9): 100 mg via ORAL
  Filled 2017-04-28 (×9): qty 1

## 2017-04-28 MED ORDER — ALLOPURINOL 300 MG PO TABS
300.0000 mg | ORAL_TABLET | Freq: Every evening | ORAL | Status: DC
  Administered 2017-04-28 – 2017-05-06 (×9): 300 mg via ORAL
  Filled 2017-04-28 (×9): qty 1

## 2017-04-28 MED ORDER — SENNA 8.6 MG PO TABS
1.0000 | ORAL_TABLET | Freq: Every day | ORAL | Status: DC
  Administered 2017-04-28 – 2017-05-07 (×9): 8.6 mg via ORAL
  Filled 2017-04-28 (×9): qty 1

## 2017-04-28 MED ORDER — FUROSEMIDE 40 MG PO TABS
40.0000 mg | ORAL_TABLET | Freq: Every day | ORAL | Status: DC
  Administered 2017-04-30 – 2017-05-07 (×8): 40 mg via ORAL
  Filled 2017-04-28 (×9): qty 1

## 2017-04-28 MED ORDER — ACETAMINOPHEN 325 MG PO TABS
650.0000 mg | ORAL_TABLET | ORAL | Status: DC | PRN
  Administered 2017-05-05: 650 mg via ORAL
  Filled 2017-04-28: qty 2

## 2017-04-28 MED ORDER — LEVOTHYROXINE SODIUM 50 MCG PO TABS
50.0000 ug | ORAL_TABLET | Freq: Every day | ORAL | Status: DC
  Administered 2017-04-29 – 2017-05-07 (×9): 50 ug via ORAL
  Filled 2017-04-28 (×9): qty 1

## 2017-04-28 NOTE — Progress Notes (Addendum)
ANTICOAGULATION CONSULT NOTE - Initial Consult  Pharmacy Consult for heparin infusion Indication: atrial fibrillation and mechanical mitral valve replacement  Allergies  Allergen Reactions  . Ace Inhibitors Cough  . Lipitor [Atorvastatin Calcium] Other (See Comments)    Knee pain.  . Codeine Nausea And Vomiting    Patient Measurements: Ht: 5'2" Wt: 48.8 kg Heparin Dosing Weight: 48.8 kg  Vital Signs: Temp: 98 F (36.7 C) (11/27 1203) Temp Source: Oral (11/27 1203) BP: 131/61 (11/27 1203) Pulse Rate: 63 (11/27 1203)  Labs: Recent Labs    04/27/17 1041  INR 2.0    Estimated Creatinine Clearance: 19.7 mL/min (A) (by C-G formula based on SCr of 1.93 mg/dL (H)).   Medical History: Past Medical History:  Diagnosis Date  . Allergy    seasonal  . Anemia   . Anxiety   . Arthritis   . Asthma   . 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 Protecta  . Biventricular ICD (implantable cardioverter-defibrillator) in place    with CRT  . Blood transfusion without reported diagnosis   . Bursitis   . Cataract    RIGHT EYE  . CHF (congestive heart failure) ([place])   . Chronic kidney disease   . Colon polyp    adenomatous  . Complication of anesthesia    patient stated that had difficulty getting the breathing tube removed, patient said that she stopped breathing and HR dropped to 10  patient then woke up and started breathing pateint stated no longer than one minute; re-intubated in PACU following cholecystectomy 10/28/13  . COPD (chronic obstructive pulmonary disease) ([place])   . Depression   . Diverticulosis   . Dysrhythmia   . Fibromyalgia   . GERD (gastroesophageal reflux disease)   . Gout   . H/O mitral valve replacement 2002, 2007  . Heart murmur   . Hemorrhoids   . Hyperlipidemia   . Hypertension   . Hypothyroidism   . IBS (irritable bowel syndrome)   . MVP (mitral valve prolapse)   . Neuromuscular disorder (HCC)    fibromyalgia  .  Pacemaker   . Peptic ulcer disease     Medications:  Scheduled:  . allopurinol  300 mg Oral QPM  . [START ON 04/29/2017] buPROPion  100 mg Oral Daily  . [START ON 04/29/2017] chlorhexidine  60 mL Topical Once  . [START ON 04/29/2017] chlorhexidine  60 mL Topical Once  . [START ON 04/29/2017] famotidine  20 mg Oral Daily  . [START ON 04/29/2017] furosemide  40 mg Oral Daily  . [START ON 04/29/2017] gentamicin irrigation  80 mg Irrigation On Call  . hydrOXYzine  25 mg Oral QHS  . [START ON 04/29/2017] levothyroxine  50 mcg Oral QAC breakfast  . [START ON 04/29/2017] metoprolol tartrate  25 mg Oral Daily  . multivitamin with minerals  1 tablet Oral Daily  . pravastatin  40 mg Oral QHS  . senna  1 tablet Oral QHS    Assessment: 41 yof admitted with a history of a mechanical valve replacement and paroxysmal atrial fibrillation. She follows for warfarin management with the outpatient anticoagulation clinic, goal 2.5-3.5 on regimen of 2.5 mg daily except 5 mg on Monday/Friday. INR yesterday was 2 at appointment- confirmed with patient that she held dose as instructed. Last dose was 2.5 mg on 11/25. Plan for her to undergo generator change out for her CRT-D and shoulder surgery during this admission. Will bridge with heparin infusion during peri-procedure time frame.  No  signs/symptoms of bleeding noted- oozing around IV site. INR is 1.67 today. Hgb is 11.2 and platelets are within normal limits. No previous records on heparin to assess dosing.    Goal of Therapy:  Heparin level 0.3-0.7 units/ml Monitor platelets by anticoagulation protocol: Yes   Plan:  Continue to hold warfarin  Start heparin infusion at 700 units/hr Check anti-Xa level in 8 hours and daily while on heparin Continue to monitor H&H and platelets  Doylene Canard, PharmD Clinical Pharmacist  Pager: [phone] Clinical Phone for 04/28/2017 until 3:30pm: [phone] If after 3:30pm, please call main pharmacy at [phone]

## 2017-04-28 NOTE — Progress Notes (Signed)
Discussed plan with Dr. Sallyanne Kuster; orders written accordingly. Per our discussion, hold AM spironolactone, keep Lasix at 37m daily (per recent phone notes), and change Lopressor to Toprol given that she's been taking this once daily. I clarified with the patient which meds she had taken today to complete orders. Timeka Goette PA-C

## 2017-04-28 NOTE — H&P (Signed)
Cardiology Admission History and Physical:   Patient ID: [name]; MRN: [id number]; DOB: 03-08-1941   Admission date: 04/28/2017  Primary Care Provider: Leanna Battles, MD Primary Cardiologist: Berry/Aniesha Haughn  Chief Complaint:  Heparin "bridge" for ICD generator change and shoulder surgery  Patient Profile:   [name] is a 76 y.o. female with a history of mechanical mitral valve replacement, paroxysmal atrial fibrillation and nonischemic cardiomyopathy, CRT-D hyper responder with a generator that reached elective replacement indicator on March 23, 2017.  She requires generator change out and is also planning shoulder surgery during this admission.  History of Present Illness:   Ms. Marasco presented today for elective admission for heparin bridge for two surgical procedures: CRT-D generator change out and left shoulder surgery.  She has a mechanical mitral valve prosthesis after initially undergoing mitral valve repair in 2002, subsequent mechanical prosthesis 2007.  Before receiving CRT-D her left ventricular ejection fraction was down to 10% but has almost normalized with biventricular pacing.  Most recent echo March 2018 showed EF of 45-50%.  She is not pacemaker dependent.  Her device has shown occasional episodes of paroxysmal atrial fibrillation.  Of note her defibrillator lead is a Chemical engineer 7 [place] lead that is under advisory, but which has shown normal electronic parameters and appeared fluoroscopically normal at the time of her previous generator change.  She has never had problems with coronary disease and coronary angiography prior to her mitral valve surgery did not show stenoses.  She does not have any active cardiovascular complaints.  Over the last year or so she has lost a lot of weight due to GI problems, this appears to have finally stabilized.    Past Medical History:  Diagnosis Date  . Allergy    seasonal  . Anemia   . Anxiety   .  Arthritis   . Asthma   . 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 Protecta  . Biventricular ICD (implantable cardioverter-defibrillator) in place    with CRT  . Blood transfusion without reported diagnosis   . Bursitis   . Cataract    RIGHT EYE  . CHF (congestive heart failure) ([place])   . Chronic kidney disease   . Colon polyp    adenomatous  . Complication of anesthesia    patient stated that had difficulty getting the breathing tube removed, patient said that she stopped breathing and HR dropped to 10  patient then woke up and started breathing pateint stated no longer than one minute; re-intubated in PACU following cholecystectomy 10/28/13  . COPD (chronic obstructive pulmonary disease) ([place])   . Depression   . Diverticulosis   . Dysrhythmia   . Fibromyalgia   . GERD (gastroesophageal reflux disease)   . Gout   . H/O mitral valve replacement 2002, 2007  . Heart murmur   . Hemorrhoids   . Hyperlipidemia   . Hypertension   . Hypothyroidism   . IBS (irritable bowel syndrome)   . MVP (mitral valve prolapse)   . Neuromuscular disorder (HCC)    fibromyalgia  . Pacemaker   . Peptic ulcer disease     Past Surgical History:  Procedure Laterality Date  . ABDOMINAL HYSTERECTOMY    . CARDIAC CATHETERIZATION  02/25/2006   normal left main, normal LAD, normal L Cfx, normal/dominant RCA (Dr. [name])  . CARDIAC DEFIBRILLATOR PLACEMENT  2007, 11/202012   x2 (pacemaker) (Dr. Jerilynn Mages. Samuella Rasool)  . CARDIAC VALVE REPLACEMENT  2002   MV repair -  Dr. Berle Mull  . CHOLECYSTECTOMY N/A 10/28/2013   Procedure: LAPAROSCOPIC CHOLECYSTECTOMY WITH INTRAOPERATIVE CHOLANGIOGRAM;  Surgeon: Edward Jolly, MD;  Location: [facility];  Service: General;  Laterality: N/A;  . COLONOSCOPY    . EYE SURGERY    . IMPLANTABLE CARDIOVERTER DEFIBRILLATOR (ICD) GENERATOR CHANGE N/A 04/10/2011   Procedure: ICD GENERATOR CHANGE;  Surgeon: Sanda Klein, MD;  Location: [facility] CATH LAB;   Service: Cardiovascular;  Laterality: N/A;  . KNEE ARTHROSCOPY    . MITRAL VALVE REPLACEMENT  02/26/2006   re-do MVR w/60m St. [name] (Dr. BEllison Hughs  . NM MYOCAR PERF WALL MOTION  2005   persantine myoview - low ris, EF 63%  . RIGHT HEART CATH  04/03/2006   pulm cap wedge pressure 24/24, PA pressure 43/22 (mean 322mg), CO 4.8, CI 4.1 (Dr. J.Jackie Plum . TOTAL SHOULDER ARTHROPLASTY Right 12/27/2015   Procedure: RIGHT TOTAL SHOULDER ARTHROPLASTY;  Surgeon: JuTania AdeMD;  [facility] Service: Orthopedics;  Laterality: Right;  Right total shoulder arthroplasty  . TRANSTHORACIC ECHOCARDIOGRAM  12/2011   EF 50-55%, mild global hypokinesis; LA severely dilated; calcification of anterior/posterior MV leaflets, bi-leaflets St. [name] mechanical MV; mild TR; trace AV regurg/pulm valve regurg     Medications Prior to Admission: Prior to Admission medications   Medication Sig Start Date End Date Taking? Authorizing Provider  allopurinol (ZYLOPRIM) 300 MG tablet Take 300 mg by mouth every evening.     [provider]  ALPRAZolam (XDuanne Moron0.25 MG tablet Take 0.25 mg by mouth 3 (three) times daily as needed for anxiety.    [provider]  amoxicillin (AMOXIL) 500 MG capsule Take 4 capsules 1 hour prior to dental appointment. 07/02/16   BeLorretta HarpMD  buPROPion (WELLBUTRIN) 100 MG tablet Take 100 mg by mouth daily. 09/12/16   [provider]  furosemide (LASIX) 40 MG tablet TAKE 2 TABLETS BY MOUTH EVERY MORNING AND 1 TAB EVERY AFTERNOON 11/24/16   Averiana Clouatre, MD  hydrOXYzine (VISTARIL) 25 MG capsule Take 25 mg by mouth at bedtime.    [provider]  ibuprofen (ADVIL,MOTRIN) 200 MG tablet Take 200 mg daily as needed by mouth for moderate pain.    [provider]  metoprolol tartrate (LOPRESSOR) 25 MG tablet Take 25 mg by mouth daily.     [provider]  Multiple Vitamins-Minerals (MULTIVITAMINS THER. W/MINERALS) TABS Take 1 tablet by  mouth every morning.     [provider]  ondansetron (ZOFRAN) 4 MG tablet Take 1 tablet (4 mg total) by mouth 3 (three) times daily. Patient not taking: Reported on 04/08/2017 10/21/16   PeIrene ShipperMD  pantoprazole (PROTONIX) 40 MG tablet Take 1 tablet (40 mg total) by mouth 2 (two) times daily before a meal. Patient not taking: Reported on 04/08/2017 11/14/16   PeIrene ShipperMD  pravastatin (PRAVACHOL) 40 MG tablet Take 40 mg at bedtime by mouth.     [provider]  PROAIR HFA 108 (90 BASE) MCG/ACT inhaler Inhale 1-2 puffs into the lungs every 6 (six) hours as needed for wheezing.  01/23/15   [provider]  ranitidine (ZANTAC) 150 MG tablet Take 150 mg daily by mouth.    [provider]  Sennosides (SENNA) 15 MG TABS Take 1 tablet at bedtime by mouth.     [provider]  spironolactone (ALDACTONE) 25 MG tablet Take 1 tablet (25 mg total) by mouth daily. 11/13/16   Joseph Bias, MiDani GobbleMD  SYNTHROID 50 MCG tablet Take 50 mcg by mouth daily. 06/05/16   [provider]  warfarin (COUMADIN) 2.5 MG tablet TAKE 1 TABLETS BY MOUTH DAILY OR AS DIRECTED BY COUMADIN CLINIC Patient taking differently: Take 2.5 mg daily except Mon and Friday take 5 mg daily 02/23/17   Lorretta Harp, MD     Allergies:    Allergies  Allergen Reactions  . Ace Inhibitors Cough  . Lipitor [Atorvastatin Calcium] Other (See Comments)    Knee pain.  . Codeine Nausea And Vomiting    Social History:   Social History   Socioeconomic History  . Marital status: Married    Spouse name: [name]  . Number of children: 4  . Years of education: 68  . Highest education level: Not on file  Social Needs  . Financial resource strain: Not on file  . Food insecurity - worry: Not on file  . Food insecurity - inability: Not on file  . Transportation needs - medical: Not on file  . Transportation needs - non-medical: Not on file  Occupational History  . Occupation: retired    Tobacco Use  . Smoking status: Former Smoker    Types: Cigarettes    Last attempt to quit: 06/02/2001    Years since quitting: 15.9  . Smokeless tobacco: Never Used  Substance and Sexual Activity  . Alcohol use: Yes    Alcohol/week: 0.0 oz    Comment: social-1 every 6 months  . Drug use: No  . Sexual activity: Not on file  Other Topics Concern  . Not on file  Social History Narrative   Drinks very little caffeine     Family History:   The patient's family history includes Breast cancer in her sister; Crohn's disease in her sister; Heart attack in her father and mother; Heart disease in her father, mother, sister, and son; Kidney disease in her father; Uterine cancer in her sister. There is no history of Colon cancer or Stomach cancer.    ROS:  Please see the history of present illness.  All other ROS reviewed and negative.     Physical Exam/Data:   Vitals:   04/28/17 1203  BP: 131/61  Pulse: 63  Resp: (!) 26  Temp: 98 F (36.7 C)  TempSrc: Oral  SpO2: 100%   No intake or output data in the 24 hours ending 04/28/17 1212 There were no vitals filed for this visit. There is no height or weight on file to calculate BMI.  General:  Thin, well developed, in no acute distress HEENT: normal Lymph: no adenopathy Neck: no JVD Endocrine:  No thryomegaly Vascular: No carotid bruits; FA pulses 2+ bilaterally without bruits  Cardiac:  normal S1, S2; RRR; no murmur crisp prosthetic clicks Lungs:  clear to auscultation bilaterally, no wheezing, rhonchi or rales  Abd: soft, nontender, no hepatomegaly  Ext: no edema Musculoskeletal:  No deformities, BUE and BLE strength normal and equal Skin: warm and dry  Neuro:  CNs 2-12 intact, no focal abnormalities noted Psych:  Normal affect  ICD is very prominent after weight loss, but site appears healthy   EKG:  The ECG that was done 03/30/2017 was personally reviewed and demonstrates ApVp rhythm  Relevant CV Studies: 07/31/2016 ECHO -  Left ventricle: The cavity size was normal. Systolic function was   mildly reduced. The estimated ejection fraction was in the range   of 45% to 50%. Wall motion was normal; there were no regional   wall motion abnormalities. -  Aortic valve: Trileaflet; mildly thickened, mildly calcified   leaflets. There was trivial regurgitation. - Mitral valve: A St. [name] Medical mechanical prosthesis was   present and functioning normally. Transvalvular velocity was   within the normal range. There was no evidence for stenosis.   Regurgitation could not be evaluated due to acoustic artifact   from the prosthesis. Mean gradient (D): 2 mm Hg. Peak gradient   (D): 5 mm Hg. Valve area by pressure half-time: 2.53 cm^2. Valve   area by continuity equation (using LVOT flow): 1.63 cm^2. - Left atrium: The atrium was mildly dilated. - Right ventricle: Pacer wire or catheter noted in right ventricle. - Tricuspid valve: There was trivial regurgitation. - Pulmonic valve: There was trivial regurgitation.  Impressions:  - No significant change from echo 2017. Mildly reduced LVF EF   45-50%, normally functioning MVR with no MS, trivial AR.  Laboratory Data: BMET    Component Value Date/Time   NA 137 04/08/2017 1247   K 4.3 04/08/2017 1247   CL 94 (L) 04/08/2017 1247   CO2 28 04/08/2017 1247   GLUCOSE 112 (H) 04/08/2017 1247   GLUCOSE 89 03/24/2016 1101   BUN 25 04/08/2017 1247   CREATININE 1.93 (H) 04/08/2017 1247   CREATININE 1.14 (H) 03/24/2016 1101   CALCIUM 10.0 04/08/2017 1247   GFRNONAA 25 (L) 04/08/2017 1247   GFRAA 29 (L) 04/08/2017 1247   CBC:    Component Value Date/Time   WBC 8.1 04/08/2017 1247   WBC 13.6 (H) 12/28/2015 0429   HGB 11.6 04/08/2017 1247   HCT 34.5 04/08/2017 1247   PLT 283 04/08/2017 1247   MCV 92 04/08/2017 1247   NEUTROABS 11.3 (H) 12/19/2015 1132   LYMPHSABS 1.4 12/19/2015 1132   MONOABS 0.8 12/19/2015 1132   EOSABS 0.3 12/19/2015 1132   BASOSABS 0.0  12/19/2015 1132      Radiology/Studies:  No results found.  Assessment and Plan:   1. CRT-D at ERI: plan gen change tomorrow. This procedure has been fully reviewed with the patient and written informed consent has been obtained. Consider Tyrx pouch due to presence of MV prosthesis and 3rd procedure and thin skin. 2. S/P Mech MVR: heparin IV "bridge" to carry through shoulder surgery next day as well. 3. CHF:  No hypervolemia, may actually be a little "dry".  4. Malnutrition:  May lead to healing problems, but recently improving. 5. ARF: Hold spironolactone and furosemide and recheck BMET. 6. PAF: infrequent episodes.  Severity of Illness: The appropriate patient status for this patient is INPATIENT. Inpatient status is judged to be reasonable and necessary in order to provide the required intensity of service to ensure the patient's safety. The patient's presenting symptoms, physical exam findings, and initial radiographic and laboratory data in the context of their chronic comorbidities is felt to place them at high risk for further clinical deterioration. Furthermore, it is not anticipated that the patient will be medically stable for discharge from the hospital within 2 midnights of admission. The following factors support the patient status of inpatient.   " The patient's presenting symptoms include mechanical MV prosthesis. " The initial radiographic and laboratory data are worrisome because of acute renal insufficiency. " The chronic co-morbidities include MVR, CRT-D, PAFib.   * I certify that at the point of admission it is my clinical judgment that the patient will require inpatient hospital care spanning beyond 2 midnights from the point of admission due to high intensity of service, high risk for further  deterioration and high frequency of surveillance required.*    For questions or updates, please contact [facility] Please consult [email or web address] for contact info under  Cardiology/STEMI.    Signed, Sanda Klein, MD  04/28/2017 12:12 PM

## 2017-04-28 NOTE — Progress Notes (Signed)
ANTICOAGULATION CONSULT NOTE - Follow Up Consult  Pharmacy Consult for Heparin Indication: atrial fibrillation and mechanical mitral valve  Allergies  Allergen Reactions  . Ace Inhibitors Cough  . Lipitor [Atorvastatin Calcium] Other (See Comments)    Knee pain.  . Codeine Nausea And Vomiting    Patient Measurements: Height: 5' 2.25" (158.1 cm) Weight: 107 lb 9.4 oz (48.8 kg) IBW/kg (Calculated) : 50.68 Heparin Dosing Weight: 48.8 kg  Vital Signs: Temp: 98 F (36.7 C) (11/27 2039) Temp Source: Oral (11/27 2039) BP: 136/48 (11/27 2039) Pulse Rate: 66 (11/27 2039)  Labs: Recent Labs    04/27/17 1041 04/28/17 1240 04/28/17 2151  HGB  --  11.2*  --   HCT  --  35.1*  --   PLT  --  249  --   LABPROT  --  19.5*  --   INR 2.0 1.67  --   HEPARINUNFRC  --   --  0.16*  CREATININE  --  1.42*  --     Estimated Creatinine Clearance: 26.8 mL/min (A) (by C-G formula based on SCr of 1.42 mg/dL (H)).  Assessment:  40 yof admitted with a history of a mechanical valve replacement and paroxysmal atrial fibrillation. She follows for warfarin management with the outpatient anticoagulation clinic, goal 2.5-3.5 on regimen of 2.5 mg daily except 5 mg on Monday/Friday. INR yesterday was 2 at appointment- confirmed with patient that she held dose as instructed. Last dose was 2.5 mg on 11/25. Plan for her to undergo generator change out for her CRT-D on 04/29/17 and shoulder surgery on 04/30/17.       Heparin bridge begun this afternoon.  Initial heparin level is subtherapeutic (0.16) on 700 units/hr.  No infusion problems or bleeding reported.  Goal of Therapy:  Heparin level 0.3-0.7 units/ml Monitor platelets by anticoagulation protocol: Yes   Plan:   Increase heparin drip to 900 units/hr.  Next heparin level, PT/INR and CBC in am.  Coumadin on hold for procedures.  Claudie Fisherman Pager: [phone] 04/28/2017,11:29 PM

## 2017-04-29 ENCOUNTER — Inpatient Hospital Stay (HOSPITAL_COMMUNITY)
Admission: RE | Disposition: A | Discharge status: Home / Self Care | Payer: Self-pay | Source: Ambulatory Visit | Attending: Cardiovascular Disease

## 2017-04-29 ENCOUNTER — Other Ambulatory Visit: Payer: Self-pay

## 2017-04-29 ENCOUNTER — Ambulatory Visit (HOSPITAL_COMMUNITY): Admission: RE | Admit: 2017-04-29 | Payer: Medicare Other | Source: Ambulatory Visit | Admitting: Cardiovascular Disease

## 2017-04-29 ENCOUNTER — Encounter (HOSPITAL_COMMUNITY): Payer: Self-pay

## 2017-04-29 HISTORY — PX: BIV ICD GENERATOR CHANGEOUT: EP1194

## 2017-04-29 LAB — CBC
HEMATOCRIT: 31.5 % — AB (ref 36.0–46.0)
Hemoglobin: 10.3 g/dL — ABNORMAL LOW (ref 12.0–15.0)
MCH: 31.2 pg (ref 26.0–34.0)
MCHC: 32.7 g/dL (ref 30.0–36.0)
MCV: 95.5 fL (ref 78.0–100.0)
PLATELETS: 241 10*3/uL (ref 150–400)
RBC: 3.3 MIL/uL — ABNORMAL LOW (ref 3.87–5.11)
RDW: 15.9 % — AB (ref 11.5–15.5)
WBC: 7.4 10*3/uL (ref 4.0–10.5)

## 2017-04-29 LAB — CBC WITH DIFFERENTIAL/PLATELET
Basophils Absolute: 0 10*3/uL (ref 0.0–0.1)
Basophils Relative: 0 %
Eosinophils Absolute: 0.2 10*3/uL (ref 0.0–0.7)
Eosinophils Relative: 3 %
HCT: 31.2 % — ABNORMAL LOW (ref 36.0–46.0)
HEMOGLOBIN: 10.2 g/dL — AB (ref 12.0–15.0)
LYMPHS ABS: 1 10*3/uL (ref 0.7–4.0)
LYMPHS PCT: 17 %
MCH: 31 pg (ref 26.0–34.0)
MCHC: 32.7 g/dL (ref 30.0–36.0)
MCV: 94.8 fL (ref 78.0–100.0)
MONOS PCT: 7 %
Monocytes Absolute: 0.4 10*3/uL (ref 0.1–1.0)
NEUTROS PCT: 73 %
Neutro Abs: 4.3 10*3/uL (ref 1.7–7.7)
Platelets: 231 10*3/uL (ref 150–400)
RBC: 3.29 MIL/uL — AB (ref 3.87–5.11)
RDW: 15.7 % — ABNORMAL HIGH (ref 11.5–15.5)
WBC: 5.9 10*3/uL (ref 4.0–10.5)

## 2017-04-29 LAB — COMPREHENSIVE METABOLIC PANEL
ALK PHOS: 82 U/L (ref 38–126)
ALT: 14 U/L (ref 14–54)
AST: 28 U/L (ref 15–41)
Albumin: 3.4 g/dL — ABNORMAL LOW (ref 3.5–5.0)
Anion gap: 5 (ref 5–15)
BUN: 15 mg/dL (ref 6–20)
CO2: 27 mmol/L (ref 22–32)
Calcium: 9.3 mg/dL (ref 8.9–10.3)
Chloride: 104 mmol/L (ref 101–111)
Creatinine, Ser: 1.34 mg/dL — ABNORMAL HIGH (ref 0.44–1.00)
GFR, EST AFRICAN AMERICAN: 44 mL/min — AB (ref >60)
GFR, EST NON AFRICAN AMERICAN: 38 mL/min — AB (ref >60)
Glucose, Bld: 83 mg/dL (ref 65–99)
Potassium: 4.2 mmol/L (ref 3.5–5.1)
Sodium: 136 mmol/L (ref 135–145)
TOTAL PROTEIN: 6.5 g/dL (ref 6.5–8.1)
Total Bilirubin: 0.3 mg/dL (ref 0.3–1.2)

## 2017-04-29 LAB — HEPARIN LEVEL (UNFRACTIONATED)
HEPARIN UNFRACTIONATED: 0.39 [IU]/mL (ref 0.30–0.70)
HEPARIN UNFRACTIONATED: 0.58 [IU]/mL (ref 0.30–0.70)

## 2017-04-29 LAB — URINALYSIS, ROUTINE W REFLEX MICROSCOPIC
Bilirubin Urine: NEGATIVE
Glucose, UA: NEGATIVE mg/dL
HGB URINE DIPSTICK: NEGATIVE
Ketones, ur: NEGATIVE mg/dL
NITRITE: NEGATIVE
PH: 7 (ref 5.0–8.0)
Protein, ur: NEGATIVE mg/dL
SPECIFIC GRAVITY, URINE: 1.008 (ref 1.005–1.030)

## 2017-04-29 LAB — BASIC METABOLIC PANEL
Anion gap: 8 (ref 5–15)
BUN: 19 mg/dL (ref 6–20)
CALCIUM: 9.7 mg/dL (ref 8.9–10.3)
CHLORIDE: 103 mmol/L (ref 101–111)
CO2: 25 mmol/L (ref 22–32)
CREATININE: 1.44 mg/dL — AB (ref 0.44–1.00)
GFR calc non Af Amer: 35 mL/min — ABNORMAL LOW (ref >60)
GFR, EST AFRICAN AMERICAN: 40 mL/min — AB (ref >60)
Glucose, Bld: 85 mg/dL (ref 65–99)
Potassium: 4 mmol/L (ref 3.5–5.1)
SODIUM: 136 mmol/L (ref 135–145)

## 2017-04-29 LAB — PROTIME-INR
INR: 1.43
INR: 1.31
PROTHROMBIN TIME: 17.3 s — AB (ref 11.4–15.2)
PROTHROMBIN TIME: 16.1 s — AB (ref 11.4–15.2)

## 2017-04-29 SURGERY — BIV ICD GENERATOR CHANGEOUT
Anesthesia: LOCAL

## 2017-04-29 MED ORDER — MIDAZOLAM HCL 5 MG/5ML IJ SOLN
INTRAMUSCULAR | Status: AC
  Filled 2017-04-29: qty 5

## 2017-04-29 MED ORDER — GENTAMICIN SULFATE 40 MG/ML IJ SOLN
INTRAMUSCULAR | Status: AC
  Filled 2017-04-29: qty 2

## 2017-04-29 MED ORDER — LIDOCAINE HCL (PF) 1 % IJ SOLN
INTRAMUSCULAR | Status: DC | PRN
  Administered 2017-04-29: 45 mL

## 2017-04-29 MED ORDER — CEFAZOLIN SODIUM-DEXTROSE 2-4 GM/100ML-% IV SOLN
INTRAVENOUS | Status: AC
  Filled 2017-04-29: qty 100

## 2017-04-29 MED ORDER — CHLORHEXIDINE GLUCONATE 4 % EX LIQD
CUTANEOUS | Status: AC
Start: 2017-04-29 — End: 2017-04-29
  Filled 2017-04-29: qty 15

## 2017-04-29 MED ORDER — LIDOCAINE HCL (PF) 1 % IJ SOLN
INTRAMUSCULAR | Status: AC
  Filled 2017-04-29: qty 60

## 2017-04-29 MED ORDER — CEFAZOLIN SODIUM-DEXTROSE 1-4 GM/50ML-% IV SOLN
1.0000 g | Freq: Four times a day (QID) | INTRAVENOUS | Status: AC
  Administered 2017-04-29 – 2017-04-30 (×3): 1 g via INTRAVENOUS
  Filled 2017-04-29 (×3): qty 50

## 2017-04-29 MED ORDER — MIDAZOLAM HCL 5 MG/5ML IJ SOLN
INTRAMUSCULAR | Status: DC | PRN
  Administered 2017-04-29: 1 mg via INTRAVENOUS

## 2017-04-29 MED ORDER — CEFAZOLIN SODIUM-DEXTROSE 2-4 GM/100ML-% IV SOLN
2.0000 g | INTRAVENOUS | Status: DC
  Filled 2017-04-29: qty 100

## 2017-04-29 MED ORDER — SPIRONOLACTONE 25 MG PO TABS
25.0000 mg | ORAL_TABLET | Freq: Every day | ORAL | Status: DC
  Administered 2017-04-29 – 2017-05-07 (×9): 25 mg via ORAL
  Filled 2017-04-29 (×9): qty 1

## 2017-04-29 MED ORDER — FENTANYL CITRATE (PF) 100 MCG/2ML IJ SOLN
INTRAMUSCULAR | Status: DC | PRN
  Administered 2017-04-29: 25 ug via INTRAVENOUS

## 2017-04-29 MED ORDER — POVIDONE-IODINE 7.5 % EX SOLN
Freq: Once | CUTANEOUS | Status: DC
  Filled 2017-04-29: qty 118

## 2017-04-29 MED ORDER — FENTANYL CITRATE (PF) 100 MCG/2ML IJ SOLN
INTRAMUSCULAR | Status: AC
  Filled 2017-04-29: qty 2

## 2017-04-29 SURGICAL SUPPLY — 5 items
CABLE SURGICAL S-101-97-12 (CABLE) ×2 IMPLANT
ICD CLARIA MRI DTMA1D1 (ICD Generator) ×2 IMPLANT
PAD DEFIB LIFELINK (PAD) ×2 IMPLANT
POUCH AIGIS-R ANTIBACT ICD (Mesh General) ×2 IMPLANT
TRAY PACEMAKER INSERTION (PACKS) ×2 IMPLANT

## 2017-04-29 NOTE — Progress Notes (Signed)
Aptt is greater than 200. Called down to cath lab where pt was and heparin was already stopped. Spoke with cath lab staff who was with Dr. Sallyanne Kuster who stated they would let him know.

## 2017-04-29 NOTE — Op Note (Addendum)
Procedure report  Procedure performed:  1. CRT-D generator changeout  2. Light sedation  Reason for procedure:  1. Device generator at elective replacement interval  2. Chronic combined systolic and diastolic heart failure - CRT "hyper-responder" Procedure performed by:  Sanda Klein, MD  Complications:  None  Estimated blood loss:  <5 mL  Medications administered during procedure:  Ancef 2 g intravenously, lidocaine 1% 50 mL locally, fentanyl 25 mcg intravenously, Versed 1 mg intravenously Device details:   [place] model number [id number], serial number [id number] Right atrial lead (chronic) Medtronic, model number [id number], serial [id number] (implanted 07/23/2006) Right ventricular lead (chronic)  St [name] Riata, model number [id number], serial number [id number] (implanted 07/23/2006) Left ventricular lead  [employer], model number [phone], serial number [id number] (implanted 07/23/2006) Explanted generator Medtronic [place],  model number [id number], serial number  [id number] (implanted 04/10/2011)  Procedure details:  After the risks and benefits of the procedure were discussed the patient provided informed consent. She was brought to the cardiac catheter lab in the fasting state.   Fluoroscopy of the St. [name] Riata ICD lead was performed and demonstrated limited extrusion of the conductors. All lead parameters were normal, there was no recorded "noise", the patient is not device dependent and has not received device therapy for VT/VF. The risk of lead revision was felt to be higher than the risk of future lead failure, especially in the setting of mechanical mitral valve prosthesis.  The patient was prepped and draped in usual sterile fashion. Local anesthesia with 1% lidocaine was administered to to the left infraclavicular area. A 5-6 cm horizontal incision was made parallel with and 7-8 cm caudal to the left clavicle. There was evidence of substantial caudal  migration and fat atrophy. An older scar was seen closer to the left clavicle. Using minimal electrocautery and mostly sharp and blunt dissection the prepectoral pocket was opened carefully to avoid injury to the loops of chronic leads. Extensive dissection was necessary, especially around the loops of the old abandoned leads which had migrated superficial to the generator. The device was explanted. The pocket was carefully inspected for hemostasis and flushed with copious amounts of antibiotic solution. The matted mass of abandoned leads was carefully dissected from the skin to allow them to be positioned posteriorly.  The leads were disconnected from the old generator and testing of the lead parameters via telemetry showed excellent values. The new generator was connected to the chronic leads, with appropriate pacing noted.   The entire system was then carefully inserted in the pocket with care been taking that the leads and device assumed a comfortable position without pressure on the incision. Great care was taken that the leads be located deep to the generator. The pocket was then closed in layers using 2 layers of 2-0 Vicryl and cutaneous staples after which a sterile dressing was applied.   At the end of the procedure the following lead parameters were encountered:   Right atrial lead sensed P waves 2 mV,  threshold 0.5 at 0.4 ms pulse width.  Right ventricular lead sensed R waves 20 mV, threshold 1V at 0.4 ms pulse width.  Left ventricular lead  threshold 1.25 at 0.4 ms pulse width.  Lead impedances were normal and unchanged from preop.  At the end of the procedure the ICD tachy therapies were turned "OFF" in anticipation of shoulder surgery tomorrow morning. For the same reason, IV heparin will not be restarted tonight, but will be resumed after her shoulder  surgery.  A petechial rash was seen overlying the upper half of the pocket. There was no erythema. It as felt likely to be related to  skin fragility, in view of the extensive fat atrophy and residual warfarin anticoagulation effect. There was no erythematous rash to suggest a true allergic response.  Sanda Klein, MD, [facility] [phone] office [phone] pager

## 2017-04-29 NOTE — Progress Notes (Addendum)
ANTICOAGULATION CONSULT NOTE - Follow Up Consult  Pharmacy Consult for Heparin Indication: atrial fibrillation and mechanical mitral valve  Allergies  Allergen Reactions  . Ace Inhibitors Cough  . Lipitor [Atorvastatin Calcium] Other (See Comments)    Knee pain.  . Codeine Nausea And Vomiting    Patient Measurements: Height: 5' 2.25" (158.1 cm) Weight: 106 lb 1.6 oz (48.1 kg) IBW/kg (Calculated) : 50.68 Heparin Dosing Weight: 48.8 kg  Vital Signs: Temp: 98.6 F (37 C) (11/28 0439) Temp Source: Oral (11/28 0439) BP: 129/58 (11/28 0439) Pulse Rate: 69 (11/28 0439)  Labs: Recent Labs    04/27/17 1041 04/28/17 1240 04/28/17 2151 04/29/17 0520  HGB  --  11.2*  --  10.3*  HCT  --  35.1*  --  31.5*  PLT  --  249  --  241  LABPROT  --  19.5*  --  17.3*  INR 2.0 1.67  --  1.43  HEPARINUNFRC  --   --  0.16* 0.39  CREATININE  --  1.42*  --  1.44*    Estimated Creatinine Clearance: 26 mL/min (A) (by C-G formula based on SCr of 1.44 mg/dL (H)).  Assessment:  76 yof admitted with a history of a mechanical valve replacement and paroxysmal atrial fibrillation. She follows for warfarin management with the outpatient anticoagulation clinic, goal 2.5-3.5 on regimen of 2.5 mg daily except 5 mg on Monday/Friday. INR yesterday was 2 at appointment- confirmed with patient that she held dose as instructed. Last dose was 2.5 mg on 11/25. Plan for her to undergo generator change out for her CRT-D today and shoulder surgery on 04/30/17.     Heparin level this morning came back therapeutic at 0.39, on 900 units/hr. Hgb down slightly from 11.2 to 10.3. Platelets are stable. No infusion problems or bleeding reported.  Goal of Therapy:  Heparin level 0.3-0.7 units/ml Monitor platelets by anticoagulation protocol: Yes   Plan:  Continue to hold warfarin therapy Continue heparin infusion at 900 units/hr Obtain 8 hour confirmatory level  Monitor daily HL, CBC, and s/sx of bleeding  Doylene Canard, PharmD Clinical Pharmacist  Pager: [phone] Clinical Phone for 04/29/2017 until 3:30pm: [phone] If after 3:30pm, please call main pharmacy at [phone] 04/29/2017,7:48 AM  ADDENDUM Repeat confirmatory heparin level came back at 0.58 on 900 units/hr. No issues with infusion. No s/sx of bleeding. Plan for generator change for patient's CRT-D today. Given two therapeutic levels, will start to monitor with daily HL and CBC.  Doylene Canard, PharmD Clinical Pharmacist

## 2017-04-29 NOTE — Progress Notes (Signed)
Progress Note  Patient Name: [name] Date of Encounter: 04/29/2017  Primary Cardiologist: Cambell Stanek  Subjective   No problems overnight. INR down to 1.6. Creat 1.4.  Inpatient Medications    Scheduled Meds: . chlorhexidine      . allopurinol  300 mg Oral QPM  . buPROPion  100 mg Oral Daily  . famotidine  20 mg Oral Daily  . furosemide  40 mg Oral Daily  . gentamicin irrigation  80 mg Irrigation On Call  . hydrOXYzine  25 mg Oral QHS  . levothyroxine  50 mcg Oral QAC breakfast  . metoprolol succinate  25 mg Oral Daily  . multivitamin with minerals  1 tablet Oral Daily  . pravastatin  40 mg Oral QHS  . senna  1 tablet Oral QHS   Continuous Infusions: . sodium chloride 50 mL/hr at 04/29/17 0518  .  ceFAZolin (ANCEF) IV    . heparin 900 Units/hr (04/28/17 2331)   PRN Meds: acetaminophen, albuterol, ALPRAZolam, ondansetron (ZOFRAN) IV   Vital Signs    Vitals:   04/28/17 2039 04/28/17 2300 04/29/17 0439 04/29/17 0850  BP: (!) 136/48  (!) 129/58 131/70  Pulse: 66  69   Resp: 20  16 12   Temp: 98 F (36.7 C)  98.6 F (37 C)   TempSrc: Oral  Oral   SpO2: 97%  99%   Weight:  107 lb 9.4 oz (48.8 kg) 106 lb 1.6 oz (48.1 kg)   Height:  5' 2.25" (1.581 m)      Intake/Output Summary (Last 24 hours) at 04/29/2017 1122 Last data filed at 04/29/2017 1050 Gross per 24 hour  Intake 845.3 ml  Output 401 ml  Net 444.3 ml   Filed Weights   04/28/17 2300 04/29/17 0439  Weight: 107 lb 9.4 oz (48.8 kg) 106 lb 1.6 oz (48.1 kg)    Telemetry    A sensed V paced - Personally Reviewed  ECG    ApVp - Personally Reviewed  Physical Exam  comfortable GEN: No acute distress.   Neck: No JVD Cardiac: RRR, no murmurs, rubs, or gallops. Crisp prosthetic valve clicks. ICD sticks out and substantial downward migration. Respiratory: Clear to auscultation bilaterally. GI: Soft, nontender, non-distended  MS: No edema; No deformity. Neuro:  Nonfocal  Psych: Normal affect     Labs    Chemistry Recent Labs  Lab 04/28/17 1240 04/29/17 0520  NA 137 136  K 4.2 4.0  CL 100* 103  CO2 29 25  GLUCOSE 77 85  BUN 19 19  CREATININE 1.42* 1.44*  CALCIUM 9.7 9.7  GFRNONAA 35* 35*  GFRAA 41* 40*  ANIONGAP 8 8     Hematology Recent Labs  Lab 04/28/17 1240 04/29/17 0520  WBC 6.8 7.4  RBC 3.68* 3.30*  HGB 11.2* 10.3*  HCT 35.1* 31.5*  MCV 95.4 95.5  MCH 30.4 31.2  MCHC 31.9 32.7  RDW 15.8* 15.9*  PLT 249 241    Cardiac EnzymesNo results for input(s): TROPONINI in the last 168 hours. No results for input(s): TROPIPOC in the last 168 hours.   BNPNo results for input(s): BNP, PROBNP in the last 168 hours.   DDimer No results for input(s): DDIMER in the last 168 hours.   Radiology    No results found.  Cardiac Studies   07/31/2016 ECHO - Left ventricle: The cavity size was normal. Systolic function was mildly reduced. The estimated ejection fraction was in the range of 45% to 50%. Wall motion was  normal; there were no regional wall motion abnormalities. - Aortic valve: Trileaflet; mildly thickened, mildly calcified leaflets. There was trivial regurgitation. - Mitral valve: A [name] mechanical prosthesis was present and functioning normally. Transvalvular velocity was within the normal range. There was no evidence for stenosis. Regurgitation could not be evaluated due to acoustic artifact from the prosthesis. Mean gradient (D): 2 mm Hg. Peak gradient (D): 5 mm Hg. Valve area by pressure half-time: 2.53 cm^2. Valve area by continuity equation (using LVOT flow): 1.63 cm^2. - Left atrium: The atrium was mildly dilated. - Right ventricle: Pacer wire or catheter noted in right ventricle. - Tricuspid valve: There was trivial regurgitation. - Pulmonic valve: There was trivial regurgitation.  Impressions:  - No significant change from echo 2017. Mildly reduced LVF EF 45-50%, normally functioning MVR with no MS,  trivial AR.    Patient Profile     76 y.o. female w CHF, PAF and mechanical MVR, admitted for heparin bridge to allow CTR-D generator change today and left shoulder surgery tomorrow.  Assessment & Plan    1. CRT-D at ERI: . This procedure has been fully reviewed with the patient and written informed consent has been obtained. Plan Tyrx pouch. 2. S/P Mech MVR: heparin IV "bridge" to carry through shoulder surgery next day as well. 3. CHF:  appears compensated, euvolemic 4. Malnutrition:  May lead to healing problems, but recently improving. 5. ARF on CKD 3: I think she is back to her renal function baseline. Restart furosemide lower dose and spironolactone. 6. PAF: infrequent episodes.     For questions or updates, please contact [place] Please consult [email or web address] for contact info under Cardiology/STEMI.      Signed, Sanda Klein, MD  04/29/2017, 11:22 AM

## 2017-04-29 NOTE — Progress Notes (Signed)
Pt back from cath lab. Dressing clean and dry.

## 2017-04-29 NOTE — Plan of Care (Signed)
Patient instructed not to lay on left side tonight per doctor's orders.

## 2017-04-30 ENCOUNTER — Encounter (HOSPITAL_COMMUNITY): Payer: Self-pay | Admitting: Certified Registered"

## 2017-04-30 ENCOUNTER — Encounter (HOSPITAL_COMMUNITY)
Admission: RE | Disposition: A | Discharge status: Home / Self Care | Payer: Self-pay | Source: Ambulatory Visit | Attending: Cardiovascular Disease

## 2017-04-30 ENCOUNTER — Encounter (HOSPITAL_COMMUNITY): Payer: Self-pay | Admitting: Cardiovascular Disease

## 2017-04-30 DIAGNOSIS — N179 Acute kidney failure, unspecified: Secondary | ICD-10-CM

## 2017-04-30 DIAGNOSIS — N183 Chronic kidney disease, stage 3 (moderate): Secondary | ICD-10-CM

## 2017-04-30 LAB — CUP PACEART INCLINIC DEVICE CHECK
Implantable Lead Implant Date: 20080221
Implantable Lead Location: 753858
Implantable Lead Location: 753859
Implantable Lead Location: 753860
Implantable Lead Model: 4543
Implantable Lead Model: 5076
Implantable Lead Model: 7002
Implantable Lead Serial Number: 134379
Implantable Pulse Generator Implant Date: 20080221
Lead Channel Setting Pacing Amplitude: 1.5 V
Lead Channel Setting Pacing Amplitude: 2.25 V
Lead Channel Setting Pacing Pulse Width: 0.4 ms
Lead Channel Setting Pacing Pulse Width: 0.4 ms
Lead Channel Setting Sensing Sensitivity: 0.3 mV
MDC IDC LEAD IMPLANT DT: 20080221
MDC IDC LEAD IMPLANT DT: 20080221
MDC IDC SESS DTM: 20181129120247
MDC IDC SET LEADCHNL LV PACING AMPLITUDE: 1.75 V

## 2017-04-30 LAB — PROTIME-INR
INR: 1.16
PROTHROMBIN TIME: 14.8 s (ref 11.4–15.2)

## 2017-04-30 LAB — CBC
HCT: 30.2 % — ABNORMAL LOW (ref 36.0–46.0)
Hemoglobin: 10.2 g/dL — ABNORMAL LOW (ref 12.0–15.0)
MCH: 32 pg (ref 26.0–34.0)
MCHC: 33.8 g/dL (ref 30.0–36.0)
MCV: 94.7 fL (ref 78.0–100.0)
PLATELETS: 214 10*3/uL (ref 150–400)
RBC: 3.19 MIL/uL — AB (ref 3.87–5.11)
RDW: 15.6 % — ABNORMAL HIGH (ref 11.5–15.5)
WBC: 7.5 10*3/uL (ref 4.0–10.5)

## 2017-04-30 LAB — HEPARIN LEVEL (UNFRACTIONATED): Heparin Unfractionated: 0.31 IU/mL (ref 0.30–0.70)

## 2017-04-30 SURGERY — ARTHROPLASTY, SHOULDER, TOTAL, REVERSE
Anesthesia: Choice | Laterality: Left

## 2017-04-30 MED ORDER — PROPOFOL 10 MG/ML IV BOLUS
INTRAVENOUS | Status: AC
  Filled 2017-04-30: qty 20

## 2017-04-30 MED ORDER — HEPARIN (PORCINE) IN NACL 100-0.45 UNIT/ML-% IJ SOLN
900.0000 [IU]/h | INTRAMUSCULAR | Status: DC
  Administered 2017-04-30 – 2017-05-06 (×4): 900 [IU]/h via INTRAVENOUS
  Filled 2017-04-30 (×6): qty 250

## 2017-04-30 MED ORDER — LIDOCAINE 2% (20 MG/ML) 5 ML SYRINGE
INTRAMUSCULAR | Status: AC
  Filled 2017-04-30: qty 5

## 2017-04-30 MED ORDER — MIDAZOLAM HCL 2 MG/2ML IJ SOLN
INTRAMUSCULAR | Status: AC
  Filled 2017-04-30: qty 2

## 2017-04-30 MED ORDER — ROCURONIUM BROMIDE 10 MG/ML (PF) SYRINGE
PREFILLED_SYRINGE | INTRAVENOUS | Status: AC
  Filled 2017-04-30: qty 5

## 2017-04-30 MED ORDER — WARFARIN - PHARMACIST DOSING INPATIENT
Freq: Every day | Status: DC
  Administered 2017-05-04 – 2017-05-06 (×3)

## 2017-04-30 MED ORDER — FENTANYL CITRATE (PF) 100 MCG/2ML IJ SOLN
INTRAMUSCULAR | Status: AC
  Filled 2017-04-30: qty 2

## 2017-04-30 MED ORDER — WARFARIN SODIUM 5 MG PO TABS
5.0000 mg | ORAL_TABLET | Freq: Once | ORAL | Status: AC
  Administered 2017-04-30: 5 mg via ORAL
  Filled 2017-04-30: qty 1

## 2017-04-30 MED ORDER — FENTANYL CITRATE (PF) 250 MCG/5ML IJ SOLN
INTRAMUSCULAR | Status: AC
  Filled 2017-04-30: qty 5

## 2017-04-30 MED FILL — Gentamicin Sulfate Inj 40 MG/ML: INTRAMUSCULAR | Qty: 2 | Status: AC

## 2017-04-30 MED FILL — Sodium Chloride Irrigation Soln 0.9%: Qty: 500 | Status: AC

## 2017-04-30 NOTE — Discharge Instructions (Addendum)
Supplemental Discharge Instructions for  Pacemaker/Defibrillator Patients **Wound check is at the [facility] (not [facility])**  Activity Do not raise your left/right arm above shoulder level or extend it backward beyond shoulder level for 2 weeks. Wear the arm sling as a reminder or as needed for comfort for 2 weeks. No heavy lifting or vigorous activity with your left/right arm for 6-8 weeks.    NO DRIVING is preferable for 2 weeks; If absolutely necessary, drive only short, familiar routes. DO wear your seatbelt, even if it crosses over the pacemaker site.  WOUND CARE - Keep the wound area clean and dry.  Remove the dressing the day after you return home (usually 48 hours after the procedure). - DO NOT SUBMERGE UNDER WATER UNTIL FULLY HEALED (no tub baths, hot tubs, swimming pools, etc.).  - You  may shower or take a sponge bath after the dressing is removed. DO NOT SOAK the area and do not allow the shower to directly spray on the site. - If you have staples, these will be removed in the office in 7-14 days. - If you have tape/steri-strips on your wound, these will fall off; do not pull them off prematurely.   - No bandage is needed on the site.  DO  NOT apply any creams, oils, or ointments to the wound area. - If you notice any drainage or discharge from the wound, any swelling, excessive redness or bruising at the site, or if you develop a fever > 101? F after you are discharged home, call the office at once.  Special Instructions - You are still able to use cellular telephones.  Avoid carrying your cellular phone near your device. - When traveling through airports, show security personnel your identification card to avoid being screened in the metal detectors.  - Avoid arc welding equipment, MRI testing (magnetic resonance imaging), TENS units (transcutaneous nerve stimulators).  Call the office for questions about other devices. - Avoid electrical appliances that are  in poor condition or are not properly grounded. - Microwave ovens are safe to be near or to operate.  Additional information for defibrillator patients should your device go off: - If your device goes off ONCE and you feel fine afterward, notify the clinic at [phone]. - If your device goes off ONCE and you do not feel well afterward, call 911. - If your device goes off TWICE or more in one day, call 911.  DO NOT DRIVE YOURSELF OR A FAMILY MEMBER WITH A DEFIBRILLATOR TO THE HOSPITAL--CALL 911.    Information on my medicine - Coumadin   (Warfarin)  This medication education was reviewed with me or my healthcare representative as part of my discharge preparation.  The pharmacist that spoke with me during my hospital stay was:  Arty Baumgartner, [facility]  Why was Coumadin prescribed for you? Coumadin was prescribed for you because you have a blood clot or a medical condition that can cause an increased risk of forming blood clots. Blood clots can cause serious health problems by blocking the flow of blood to the heart, lung, or brain. Coumadin can prevent harmful blood clots from forming. As a reminder your indication for Coumadin is:   Blood Clot Prevention After Heart Valve Surgeryand stoke prevent because of atrial fibrillation What test will check on my response to Coumadin? While on Coumadin (warfarin) you will need to have an INR test regularly to ensure that your dose is keeping you in the desired range. The INR (  international normalized ratio) number is calculated from the result of the laboratory test called prothrombin time (PT).  If an INR APPOINTMENT HAS NOT ALREADY BEEN MADE FOR YOU please schedule an appointment to have this lab work done by your health care provider within 7 days. Your INR goal is usually a number between:  2 to 3 or your provider may give you a more narrow range like 2-2.5.  Ask your health care provider during an office visit what your goal INR is.  What   do you need to  know  About  COUMADIN? Take Coumadin (warfarin) exactly as prescribed by your healthcare provider about the same time each day.  DO NOT stop taking without talking to the doctor who prescribed the medication.  Stopping without other blood clot prevention medication to take the place of Coumadin may increase your risk of developing a new clot or stroke.  Get refills before you run out.  What do you do if you miss a dose? If you miss a dose, take it as soon as you remember on the same day then continue your regularly scheduled regimen the next day.  Do not take two doses of Coumadin at the same time.  Important Safety Information A possible side effect of Coumadin (Warfarin) is an increased risk of bleeding. You should call your healthcare provider right away if you experience any of the following: ? Bleeding from an injury or your nose that does not stop. ? Unusual colored urine (red or dark brown) or unusual colored stools (red or black). ? Unusual bruising for unknown reasons. ? A serious fall or if you hit your head (even if there is no bleeding).  Some foods or medicines interact with Coumadin (warfarin) and might alter your response to warfarin. To help avoid this: ? Eat a balanced diet, maintaining a consistent amount of Vitamin K. ? Notify your provider about major diet changes you plan to make. ? Avoid alcohol or limit your intake to 1 drink for women and 2 drinks for men per day. (1 drink is 5 oz. wine, 12 oz. beer, or 1.5 oz. liquor.)  Make sure that ANY health care provider who prescribes medication for you knows that you are taking Coumadin (warfarin).  Also make sure the healthcare provider who is monitoring your Coumadin knows when you have started a new medication including herbals and non-prescription products.  Coumadin (Warfarin)  Major Drug Interactions  Increased Warfarin Effect Decreased Warfarin Effect  Alcohol (large quantities) Antibiotics (esp.  Septra/Bactrim, Flagyl, Cipro) Amiodarone (Cordarone) Aspirin (ASA) Cimetidine (Tagamet) Megestrol (Megace) NSAIDs (ibuprofen, naproxen, etc.) Piroxicam (Feldene) Propafenone (Rythmol SR) Propranolol (Inderal) Isoniazid (INH) Posaconazole (Noxafil) Barbiturates (Phenobarbital) Carbamazepine (Tegretol) Chlordiazepoxide (Librium) Cholestyramine (Questran) Griseofulvin Oral Contraceptives Rifampin Sucralfate (Carafate) Vitamin K   Coumadin (Warfarin) Major Herbal Interactions  Increased Warfarin Effect Decreased Warfarin Effect  Garlic Ginseng Ginkgo biloba Coenzyme Q10 Green tea St. Johns wort    Coumadin (Warfarin) FOOD Interactions  Eat a consistent number of servings per week of foods HIGH in Vitamin K (1 serving =  cup)  Collards (cooked, or boiled & drained) Kale (cooked, or boiled & drained) Mustard greens (cooked, or boiled & drained) Parsley *serving size only =  cup Spinach (cooked, or boiled & drained) Swiss chard (cooked, or boiled & drained) Turnip greens (cooked, or boiled & drained)  Eat a consistent number of servings per week of foods MEDIUM-HIGH in Vitamin K (1 serving = 1 cup)  Asparagus (cooked, or boiled &  drained) Broccoli (cooked, boiled & drained, or raw & chopped) Brussel sprouts (cooked, or boiled & drained) *serving size only =  cup Lettuce, raw (green leaf, endive, romaine) Spinach, raw Turnip greens, raw & chopped   These websites have more information on Coumadin (warfarin):  FailFactory.se; VeganReport.com.au;

## 2017-04-30 NOTE — Progress Notes (Signed)
Report given to Nona Dell on 6E.

## 2017-04-30 NOTE — Anesthesia Preprocedure Evaluation (Deleted)
Anesthesia Evaluation  Patient identified by MRN, date of birth, ID band Patient awake    Reviewed: Allergy & Precautions, H&P , NPO status , Patient's Chart, lab work & pertinent test results  Airway Mallampati: II   Neck ROM: full    Dental   Pulmonary asthma , COPD, former smoker,    breath sounds clear to auscultation       Cardiovascular hypertension, +CHF  + dysrhythmias + Cardiac Defibrillator  Rhythm:regular Rate:Normal     Neuro/Psych PSYCHIATRIC DISORDERS Anxiety Depression  Neuromuscular disease    GI/Hepatic PUD, GERD  ,  Endo/Other  Hypothyroidism   Renal/GU Renal InsufficiencyRenal disease     Musculoskeletal  (+) Arthritis , Fibromyalgia -  Abdominal   Peds  Hematology  (+) anemia ,   Anesthesia Other Findings   Reproductive/Obstetrics                             Anesthesia Physical Anesthesia Plan  ASA: IV  Anesthesia Plan: General   Post-op Pain Management:  Regional for Post-op pain   Induction:   PONV Risk Score and Plan: 3 and Ondansetron, Treatment may vary due to age or medical condition and Dexamethasone  Airway Management Planned: Oral ETT  Additional Equipment:   Intra-op Plan:   Post-operative Plan: Extubation in OR  Informed Consent: I have reviewed the patients History and Physical, chart, labs and discussed the procedure including the risks, benefits and alternatives for the proposed anesthesia with the patient or authorized representative who has indicated his/her understanding and acceptance.     Plan Discussed with: CRNA, Anesthesiologist and Surgeon  Anesthesia Plan Comments:         Anesthesia Quick Evaluation

## 2017-04-30 NOTE — Progress Notes (Signed)
[place] for Heparin Indication: AFib + MVR  Allergies  Allergen Reactions  . Ace Inhibitors Cough  . Lipitor [Atorvastatin Calcium] Other (See Comments)    Knee pain.  . Codeine Nausea And Vomiting    Patient Measurements: Height: 5' 2.25" (158.1 cm) Weight: 103 lb 14.4 oz (47.1 kg) IBW/kg (Calculated) : 50.68 Heparin Dosing Weight: 47 kg  Vital Signs: Temp: 98.3 F (36.8 C) (11/29 1300) Temp Source: Oral (11/29 1300) Pulse Rate: 62 (11/29 1300)  Labs: Recent Labs    04/28/17 1240  04/29/17 0520 04/29/17 1304 04/29/17 1433 04/30/17 0329 04/30/17 2032  HGB 11.2*  --  10.3*  --  10.2* 10.2*  --   HCT 35.1*  --  31.5*  --  31.2* 30.2*  --   PLT 249  --  241  --  231 214  --   APTT  --   --   --   --  DATE 04/29/17  --   --   LABPROT 19.5*  --  17.3*  --  16.1* 14.8  --   INR 1.67  --  1.43  --  1.31 1.16  --   HEPARINUNFRC  --    < > 0.39 0.58  --   --  0.31  CREATININE 1.42*  --  1.44*  --  1.34*  --   --    < > = values in this interval not displayed.     Assessment: 50 yof on Coumadin prior to admission for history of a mechanical valve replacement and paroxysmal atrial fibrillation. She follows for warfarin management with the outpatient anticoagulation clinic, goal 2.5-3.5 on regimen of 2.5 mg daily except 5 mg on Monday/Friday.  Warfarin held for ICD generator change on 11/28 and intended shoulder surgery today. Shoulder surgery cancelled for today as noted, and to resume anticoagulation.   Evening heparin level is therapeutic.    Goal of Therapy:  Heparin level 0.3-0.7 units/ml INR 2.5-3.5 Monitor platelets by anticoagulation protocol: Yes   Plan:   Continue heparin drip at 900 units/hr  Daily heparin level, PT/INR and CBC     Harvel Quale  04/30/2017 9:30 PM

## 2017-04-30 NOTE — Progress Notes (Signed)
Progress Note  Patient Name: [name] Date of Encounter: 04/30/2017  Primary Cardiologist: Zaya Kessenich  Subjective   [name] says she is feeling well today, just having some tenderness over yesterdays surgical site. She has been up and walking around in her room without symptoms.   Inpatient Medications    Scheduled Meds: . allopurinol  300 mg Oral QPM  . buPROPion  100 mg Oral Daily  . famotidine  20 mg Oral Daily  . furosemide  40 mg Oral Daily  . hydrOXYzine  25 mg Oral QHS  . levothyroxine  50 mcg Oral QAC breakfast  . metoprolol succinate  25 mg Oral Daily  . multivitamin with minerals  1 tablet Oral Daily  . pravastatin  40 mg Oral QHS  . senna  1 tablet Oral QHS  . spironolactone  25 mg Oral Daily   Continuous Infusions: .  ceFAZolin (ANCEF) IV Stopped (04/30/17 0148)   PRN Meds: acetaminophen, albuterol, ALPRAZolam, ondansetron (ZOFRAN) IV   Vital Signs    Vitals:   04/29/17 1714 04/29/17 1752 04/29/17 2037 04/30/17 0529  BP: (!) 141/78 119/76 (!) 125/54 (!) 112/56  Pulse: 66  69 60  Resp: 13 19 19 17   Temp:   97.9 F (36.6 C) 98 F (36.7 C)  TempSrc:   Oral Oral  SpO2: 100%  99% 100%  Weight:    103 lb 14.4 oz (47.1 kg)  Height:        Intake/Output Summary (Last 24 hours) at 04/30/2017 0803 Last data filed at 04/30/2017 0547 Gross per 24 hour  Intake 840 ml  Output 1201 ml  Net -361 ml   Filed Weights   04/28/17 2300 04/29/17 0439 04/30/17 0529  Weight: 107 lb 9.4 oz (48.8 kg) 106 lb 1.6 oz (48.1 kg) 103 lb 14.4 oz (47.1 kg)    Telemetry    Paced rhythm with frequent PVCs- Personally Reviewed  ECG    Paced rhythm, heart rate 60- Personally Reviewed  Physical Exam   GEN: Well appearing, No acute distress.   Neck: No JVD Cardiac: RRR, no murmurs, rubs, or gallops. Crisp prosthetic valve click. Post op dressings over the ICD are clean, dry and intact.  Respiratory: Clear to auscultation bilaterally. GI: Soft, nontender,  non-distended  MS: No edema; No deformity. Neuro:  Nonfocal  Psych: Normal affect   Labs    Chemistry Recent Labs  Lab 04/28/17 1240 04/29/17 0520 04/29/17 1433  NA 137 136 136  K 4.2 4.0 4.2  CL 100* 103 104  CO2 29 25 27   GLUCOSE 77 85 83  BUN 19 19 15   CREATININE 1.42* 1.44* 1.34*  CALCIUM 9.7 9.7 9.3  PROT  --   --  6.5  ALBUMIN  --   --  3.4*  AST  --   --  28  ALT  --   --  14  ALKPHOS  --   --  82  BILITOT  --   --  0.3  GFRNONAA 35* 35* 38*  GFRAA 41* 40* 44*  ANIONGAP 8 8 5      Hematology Recent Labs  Lab 04/29/17 0520 04/29/17 1433 04/30/17 0329  WBC 7.4 5.9 7.5  RBC 3.30* 3.29* 3.19*  HGB 10.3* 10.2* 10.2*  HCT 31.5* 31.2* 30.2*  MCV 95.5 94.8 94.7  MCH 31.2 31.0 32.0  MCHC 32.7 32.7 33.8  RDW 15.9* 15.7* 15.6*  PLT 241 231 214    Cardiac EnzymesNo results for input(s): TROPONINI in  the last 168 hours. No results for input(s): TROPIPOC in the last 168 hours.   BNPNo results for input(s): BNP, PROBNP in the last 168 hours.   DDimer No results for input(s): DDIMER in the last 168 hours.   Radiology    No results found.  Cardiac Studies   07/31/2016 ECHO - Left ventricle: The cavity size was normal. Systolic function was mildly reduced. The estimated ejection fraction was in the range of 45% to 50%. Wall motion was normal; there were no regional wall motion abnormalities. - Aortic valve: Trileaflet; mildly thickened, mildly calcified leaflets. There was trivial regurgitation. - Mitral valve: A [name] mechanical prosthesis was present and functioning normally. Transvalvular velocity was within the normal range. There was no evidence for stenosis. Regurgitation could not be evaluated due to acoustic artifact from the prosthesis. Mean gradient (D): 2 mm Hg. Peak gradient (D): 5 mm Hg. Valve area by pressure half-time: 2.53 cm^2. Valve area by continuity equation (using LVOT flow): 1.63 cm^2. - Left atrium: The  atrium was mildly dilated. - Right ventricle: Pacer wire or catheter noted in right ventricle. - Tricuspid valve: There was trivial regurgitation. - Pulmonic valve: There was trivial regurgitation.  Impressions:  - No significant change from echo 2017. Mildly reduced LVF EF 45-50%, normally functioning MVR with no MS, trivial AR.    Patient Profile     76 y.o. female w CHF, PAF and mechanical MVR, admitted for heparin bridge to allow CTR-D generator change yesterday and left shoulder surgery today.  Assessment & Plan    CRT- D generator was changed yesterday. The generator change procedure was not complicated and tachy therapies were turned off in anticipation of shoulder surgery today.   Mechanical mitral valve - IV heparin has been held since the time of generator change but will be resumed when orthopedics feels that she is ready after surgery.   CHF: Euvolemic, no orthopnea. On furosemide and spironolactone.    Acute on chronic kidney disease - Creatinine is gradually improving back to baseline. K and BUN are WNL today.   Paroxysmal atrial fibrillation - Paced rhythm on telemetry    For questions or updates, please contact [place] Please consult [email or web address] for contact info under Cardiology/STEMI.      Signed, Ledell Noss, MD  04/30/2017, 8:03 AM    I have seen and examined the patient along with Ledell Noss, MD.  I have reviewed the chart, notes and new data.  I agree with her note.  Key new complaints: a little sore when I removed the outer pressure dressing, otherwise no complaints. Key examination changes: Minimal oozing on dressing. The petechial rash over the clavicular area seems unchanged, dusky red. Crisp prosthetic clicks. Key new findings / data: normal BiV pacing noted.  PLAN: Shoulder surgery today. Resume IV heparin and oral warfarin as soon as safe per Dr. Tamera Punt. Would avoid giving a heparin bolus. ICD therapies are off, will reenable after  surgery. Will keep in-house on IV heparin until INR 2.5.   Sanda Klein, MD, [place] [phone] 04/30/2017, 9:05 AM

## 2017-04-30 NOTE — H&P (Signed)
[name] is an 76 y.o. female.   Chief Complaint: Left shoulder pain and dysfunction HPI: [name] is scheduled for left total shoulder replacement today but had a pacemaker changed yesterday.  This is very close to the site of the planned total shoulder and her bandage has caused some reactive skin changes.  Past Medical History:  Diagnosis Date  . Allergy    seasonal  . Anemia   . Anxiety   . Arthritis   . Asthma   . 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 Protecta  . Biventricular ICD (implantable cardioverter-defibrillator) in place    with CRT  . Blood transfusion without reported diagnosis   . Bursitis   . Cataract    RIGHT EYE  . CHF (congestive heart failure) ([facility])   . Chronic kidney disease   . Colon polyp    adenomatous  . Complication of anesthesia    patient stated that had difficulty getting the breathing tube removed, patient said that [name] stopped breathing and HR dropped to 10  patient then woke up and started breathing pateint stated no longer than one minute; re-intubated in PACU following cholecystectomy 10/28/13  . COPD (chronic obstructive pulmonary disease) ([facility])   . Depression   . Diverticulosis   . Dysrhythmia   . Fibromyalgia   . GERD (gastroesophageal reflux disease)   . Gout   . H/O mitral valve replacement 2002, 2007  . Heart murmur   . Hemorrhoids   . Hyperlipidemia   . Hypertension   . Hypothyroidism   . IBS (irritable bowel syndrome)   . MVP (mitral valve prolapse)   . Neuromuscular disorder (HCC)    fibromyalgia  . Pacemaker   . Peptic ulcer disease     Past Surgical History:  Procedure Laterality Date  . ABDOMINAL HYSTERECTOMY    . BIV ICD GENERATOR CHANGEOUT N/A 04/29/2017   Procedure: BIV ICD GENERATOR CHANGEOUT;  Surgeon: Sanda Klein, MD;  Location: [facility];  Service: Cardiovascular;  Laterality: N/A;  . CARDIAC CATHETERIZATION  02/25/2006   normal left main, normal LAD, normal L  Cfx, normal/dominant RCA (Dr. [name])  . CARDIAC DEFIBRILLATOR PLACEMENT  2007, 11/202012   x2 (pacemaker) (Dr. Jerilynn Mages. [place])  . CARDIAC VALVE REPLACEMENT  2002   MV repair - Dr. Berle Mull  . CHOLECYSTECTOMY N/A 10/28/2013   Procedure: LAPAROSCOPIC CHOLECYSTECTOMY WITH INTRAOPERATIVE CHOLANGIOGRAM;  Surgeon: Edward Jolly, MD;  Location: [facility];  Service: General;  Laterality: N/A;  . COLONOSCOPY    . EYE SURGERY    . IMPLANTABLE CARDIOVERTER DEFIBRILLATOR (ICD) GENERATOR CHANGE N/A 04/10/2011   Procedure: ICD GENERATOR CHANGE;  Surgeon: Sanda Klein, MD;  Location: [facility];  Service: Cardiovascular;  Laterality: N/A;  . KNEE ARTHROSCOPY    . MITRAL VALVE REPLACEMENT  02/26/2006   re-do MVR w/19m [employer] [name] (Dr. BEllison Hughs  . NM MYOCAR PERF WALL MOTION  2005   persantine myoview - low ris, EF 63%  . RIGHT HEART CATH  04/03/2006   pulm cap wedge pressure 24/24, PA pressure 43/22 (mean 321mg), CO 4.8, CI 4.1 (Dr. J.Jackie Plum . TOTAL SHOULDER ARTHROPLASTY Right 12/27/2015   Procedure: RIGHT TOTAL SHOULDER ARTHROPLASTY;  Surgeon: JuTania AdeMD;  [place] [facility] Service: [facility]opedics;  Laterality: Right;  Right total shoulder arthroplasty  . TRANSTHORACIC ECHOCARDIOGRAM  12/2011   EF 50-55%, mild global hypokinesis; LA severely dilated; calcification of anterior/posterior MV leaflets, bi-leaflets St. [name] mechanical MV; mild  TR; trace AV regurg/pulm valve regurg    Family History  Problem Relation Age of Onset  . Breast cancer Sister        multiple  . Uterine cancer Sister        multiple  . Heart disease Mother   . Heart attack Mother   . Heart disease Father   . Kidney disease Father   . Heart attack Father   . Heart disease Sister   . Crohn's disease Sister   . Heart disease Son   . Colon cancer Neg Hx   . Stomach cancer Neg Hx    Social History:  reports that [name] quit smoking about 15 years ago. Her smoking use included cigarettes. [name] has never used  smokeless tobacco. [name] reports that [name] drinks alcohol. [name] reports that [name] does not use drugs.  Allergies:  Allergies  Allergen Reactions  . Ace Inhibitors Cough  . Lipitor [Atorvastatin Calcium] Other (See Comments)    Knee pain.  . Codeine Nausea And Vomiting    Facility-Administered Medications Prior to Admission  Medication Dose Route Frequency Provider Last Rate Last Dose  . 0.9 %  sodium chloride infusion  500 mL Intravenous Continuous Irene Shipper, MD       Medications Prior to Admission  Medication Sig Dispense Refill  . ALPRAZolam (XANAX) 0.25 MG tablet Take 0.25 mg by mouth 3 (three) times daily as needed for anxiety.    . metoprolol tartrate (LOPRESSOR) 25 MG tablet Take 25 mg by mouth daily.     . pantoprazole (PROTONIX) 40 MG tablet Take 1 tablet (40 mg total) by mouth 2 (two) times daily before a meal. 180 tablet 3  . SYNTHROID 50 MCG tablet Take 50 mcg by mouth daily.  2  . allopurinol (ZYLOPRIM) 300 MG tablet Take 300 mg by mouth every evening.     [name] amoxicillin (AMOXIL) 500 MG capsule Take 4 capsules 1 hour prior to dental appointment. 4 capsule 2  . buPROPion (WELLBUTRIN) 100 MG tablet Take 100 mg by mouth daily.  12  . furosemide (LASIX) 40 MG tablet TAKE 2 TABLETS BY MOUTH EVERY MORNING AND 1 TAB EVERY AFTERNOON 90 tablet 9  . hydrOXYzine (VISTARIL) 25 MG capsule Take 25 mg by mouth at bedtime.    [name] ibuprofen (ADVIL,MOTRIN) 200 MG tablet Take 200 mg daily as needed by mouth for moderate pain.    . Multiple Vitamins-Minerals (MULTIVITAMINS THER. W/MINERALS) TABS Take 1 tablet by mouth every morning.     . ondansetron (ZOFRAN) 4 MG tablet Take 1 tablet (4 mg total) by mouth 3 (three) times daily. (Patient not taking: Reported on 04/08/2017) 90 tablet 0  . pravastatin (PRAVACHOL) 40 MG tablet Take 40 mg at bedtime by mouth.     [name] PROAIR HFA 108 (90 BASE) MCG/ACT inhaler Inhale 1-2 puffs into the lungs every 6 (six) hours as needed for wheezing.     . ranitidine  (ZANTAC) 150 MG tablet Take 150 mg daily by mouth.    . Sennosides (SENNA) 15 MG TABS Take 1 tablet at bedtime by mouth.     . spironolactone (ALDACTONE) 25 MG tablet Take 1 tablet (25 mg total) by mouth daily. 90 tablet 3  . warfarin (COUMADIN) 2.5 MG tablet TAKE 1 TABLETS BY MOUTH DAILY OR AS DIRECTED BY COUMADIN CLINIC (Patient taking differently: Take 2.5 mg daily except Mon and Friday take 5 mg daily) 90 tablet 1    Results for orders placed or performed  during the hospital encounter of 04/28/17 (from the past 48 hour(s))  Basic metabolic panel     Status: Abnormal   Collection Time: 04/28/17 12:40 PM  Result Value Ref Range   Sodium 137 135 - 145 mmol/L   Potassium 4.2 3.5 - 5.1 mmol/L   Chloride 100 (L) 101 - 111 mmol/L   CO2 29 22 - 32 mmol/L   Glucose, Bld 77 65 - 99 mg/dL   BUN 19 6 - 20 mg/dL   Creatinine, Ser 1.42 (H) 0.44 - 1.00 mg/dL   Calcium 9.7 8.9 - 10.3 mg/dL   GFR calc non Af Amer 35 (L) >60 mL/min   GFR calc Af Amer 41 (L) >60 mL/min    Comment: (NOTE) The eGFR has been calculated using the CKD EPI equation. This calculation has not been validated in all clinical situations. eGFR's persistently <60 mL/min signify possible Chronic Kidney Disease.    Anion gap 8 5 - 15  CBC WITH DIFFERENTIAL     Status: Abnormal   Collection Time: 04/28/17 12:40 PM  Result Value Ref Range   WBC 6.8 4.0 - 10.5 K/uL   RBC 3.68 (L) 3.87 - 5.11 MIL/uL   Hemoglobin 11.2 (L) 12.0 - 15.0 g/dL   HCT 35.1 (L) 36.0 - 46.0 %   MCV 95.4 78.0 - 100.0 fL   MCH 30.4 26.0 - 34.0 pg   MCHC 31.9 30.0 - 36.0 g/dL   RDW 15.8 (H) 11.5 - 15.5 %   Platelets 249 150 - 400 K/uL   Neutrophils Relative % 77 %   Neutro Abs 5.3 1.7 - 7.7 K/uL   Lymphocytes Relative 11 %   Lymphs Abs 0.7 0.7 - 4.0 K/uL   Monocytes Relative 10 %   Monocytes Absolute 0.7 0.1 - 1.0 K/uL   Eosinophils Relative 2 %   Eosinophils Absolute 0.1 0.0 - 0.7 K/uL   Basophils Relative 0 %   Basophils Absolute 0.0 0.0 - 0.1  K/uL  Protime-INR     Status: Abnormal   Collection Time: 04/28/17 12:40 PM  Result Value Ref Range   Prothrombin Time 19.5 (H) 11.4 - 15.2 seconds   INR 1.67   Surgical PCR screen     Status: None   Collection Time: 04/28/17 12:49 PM  Result Value Ref Range   MRSA, PCR NEGATIVE NEGATIVE   Staphylococcus aureus NEGATIVE NEGATIVE    Comment: (NOTE) The Xpert SA Assay (FDA approved for NASAL specimens in patients 60 years of age and older), is one component of a comprehensive surveillance program. It is not intended to diagnose infection nor to guide or monitor treatment.   Heparin level (unfractionated)     Status: Abnormal   Collection Time: 04/28/17  9:51 PM  Result Value Ref Range   Heparin Unfractionated 0.16 (L) 0.30 - 0.70 IU/mL    Comment:        IF HEPARIN RESULTS ARE BELOW EXPECTED VALUES, AND PATIENT DOSAGE HAS BEEN CONFIRMED, SUGGEST FOLLOW UP TESTING OF ANTITHROMBIN III LEVELS.   Basic metabolic panel     Status: Abnormal   Collection Time: 04/29/17  5:20 AM  Result Value Ref Range   Sodium 136 135 - 145 mmol/L   Potassium 4.0 3.5 - 5.1 mmol/L   Chloride 103 101 - 111 mmol/L   CO2 25 22 - 32 mmol/L   Glucose, Bld 85 65 - 99 mg/dL   BUN 19 6 - 20 mg/dL   Creatinine, Ser 1.44 (H) 0.44 - 1.00  mg/dL   Calcium 9.7 8.9 - 10.3 mg/dL   GFR calc non Af Amer 35 (L) >60 mL/min   GFR calc Af Amer 40 (L) >60 mL/min    Comment: (NOTE) The eGFR has been calculated using the CKD EPI equation. This calculation has not been validated in all clinical situations. eGFR's persistently <60 mL/min signify possible Chronic Kidney Disease.    Anion gap 8 5 - 15  Heparin level (unfractionated)     Status: None   Collection Time: 04/29/17  5:20 AM  Result Value Ref Range   Heparin Unfractionated 0.39 0.30 - 0.70 IU/mL    Comment:        IF HEPARIN RESULTS ARE BELOW EXPECTED VALUES, AND PATIENT DOSAGE HAS BEEN CONFIRMED, SUGGEST FOLLOW UP TESTING OF ANTITHROMBIN III LEVELS.    CBC     Status: Abnormal   Collection Time: 04/29/17  5:20 AM  Result Value Ref Range   WBC 7.4 4.0 - 10.5 K/uL   RBC 3.30 (L) 3.87 - 5.11 MIL/uL   Hemoglobin 10.3 (L) 12.0 - 15.0 g/dL   HCT 31.5 (L) 36.0 - 46.0 %   MCV 95.5 78.0 - 100.0 fL   MCH 31.2 26.0 - 34.0 pg   MCHC 32.7 30.0 - 36.0 g/dL   RDW 15.9 (H) 11.5 - 15.5 %   Platelets 241 150 - 400 K/uL  Protime-INR     Status: Abnormal   Collection Time: 04/29/17  5:20 AM  Result Value Ref Range   Prothrombin Time 17.3 (H) 11.4 - 15.2 seconds   INR 1.43   Heparin level (unfractionated)     Status: None   Collection Time: 04/29/17  1:04 PM  Result Value Ref Range   Heparin Unfractionated 0.58 0.30 - 0.70 IU/mL    Comment:        IF HEPARIN RESULTS ARE BELOW EXPECTED VALUES, AND PATIENT DOSAGE HAS BEEN CONFIRMED, SUGGEST FOLLOW UP TESTING OF ANTITHROMBIN III LEVELS.   APTT     Status: None   Collection Time: 04/29/17  2:33 PM  Result Value Ref Range   aPTT DATE 04/29/17 24 - 36 seconds    Comment: CORRECTED ON 11/29 AT 8921: PREVIOUSLY REPORTED AS >200 SPECIMEN CHECKED FOR CLOTS REPEATED TO VERIFY CRITICAL RESULT CALLED TO, READ BACK BY AND VERIFIED WITH: A.DUNDON RN HDAY 1606 04/30/15        IF BASELINE aPTT IS ELEVATED, SUGGEST PATIENT RISK  ASSESSMENT BE USED TO DETERMINE APPROPRIATE ANTICOAGULANT THERAPY.   CBC WITH DIFFERENTIAL     Status: Abnormal   Collection Time: 04/29/17  2:33 PM  Result Value Ref Range   WBC 5.9 4.0 - 10.5 K/uL   RBC 3.29 (L) 3.87 - 5.11 MIL/uL   Hemoglobin 10.2 (L) 12.0 - 15.0 g/dL   HCT 31.2 (L) 36.0 - 46.0 %   MCV 94.8 78.0 - 100.0 fL   MCH 31.0 26.0 - 34.0 pg   MCHC 32.7 30.0 - 36.0 g/dL   RDW 15.7 (H) 11.5 - 15.5 %   Platelets 231 150 - 400 K/uL   Neutrophils Relative % 73 %   Neutro Abs 4.3 1.7 - 7.7 K/uL   Lymphocytes Relative 17 %   Lymphs Abs 1.0 0.7 - 4.0 K/uL   Monocytes Relative 7 %   Monocytes Absolute 0.4 0.1 - 1.0 K/uL   Eosinophils Relative 3 %   Eosinophils Absolute  0.2 0.0 - 0.7 K/uL   Basophils Relative 0 %   Basophils Absolute 0.0 0.0 -  0.1 K/uL  Comprehensive metabolic panel     Status: Abnormal   Collection Time: 04/29/17  2:33 PM  Result Value Ref Range   Sodium 136 135 - 145 mmol/L   Potassium 4.2 3.5 - 5.1 mmol/L   Chloride 104 101 - 111 mmol/L   CO2 27 22 - 32 mmol/L   Glucose, Bld 83 65 - 99 mg/dL   BUN 15 6 - 20 mg/dL   Creatinine, Ser 1.34 (H) 0.44 - 1.00 mg/dL   Calcium 9.3 8.9 - 10.3 mg/dL   Total Protein 6.5 6.5 - 8.1 g/dL   Albumin 3.4 (L) 3.5 - 5.0 g/dL   AST 28 15 - 41 U/L   ALT 14 14 - 54 U/L   Alkaline Phosphatase 82 38 - 126 U/L   Total Bilirubin 0.3 0.3 - 1.2 mg/dL   GFR calc non Af Amer 38 (L) >60 mL/min   GFR calc Af Amer 44 (L) >60 mL/min    Comment: (NOTE) The eGFR has been calculated using the CKD EPI equation. This calculation has not been validated in all clinical situations. eGFR's persistently <60 mL/min signify possible Chronic Kidney Disease.    Anion gap 5 5 - 15  Protime-INR     Status: Abnormal   Collection Time: 04/29/17  2:33 PM  Result Value Ref Range   Prothrombin Time 16.1 (H) 11.4 - 15.2 seconds   INR 1.31   Urinalysis, Routine w reflex microscopic     Status: Abnormal   Collection Time: 04/29/17  6:52 PM  Result Value Ref Range   Color, Urine YELLOW YELLOW   APPearance CLEAR CLEAR   Specific Gravity, Urine 1.008 1.005 - 1.030   pH 7.0 5.0 - 8.0   Glucose, UA NEGATIVE NEGATIVE mg/dL   Hgb urine dipstick NEGATIVE NEGATIVE   Bilirubin Urine NEGATIVE NEGATIVE   Ketones, ur NEGATIVE NEGATIVE mg/dL   Protein, ur NEGATIVE NEGATIVE mg/dL   Nitrite NEGATIVE NEGATIVE   Leukocytes, UA SMALL (A) NEGATIVE   RBC / HPF 0-5 0 - 5 RBC/hpf   WBC, UA 0-5 0 - 5 WBC/hpf   Bacteria, UA RARE (A) NONE SEEN   Squamous Epithelial / LPF 0-5 (A) NONE SEEN  CBC     Status: Abnormal   Collection Time: 04/30/17  3:29 AM  Result Value Ref Range   WBC 7.5 4.0 - 10.5 K/uL   RBC 3.19 (L) 3.87 - 5.11 MIL/uL    Hemoglobin 10.2 (L) 12.0 - 15.0 g/dL   HCT 30.2 (L) 36.0 - 46.0 %   MCV 94.7 78.0 - 100.0 fL   MCH 32.0 26.0 - 34.0 pg   MCHC 33.8 30.0 - 36.0 g/dL   RDW 15.6 (H) 11.5 - 15.5 %   Platelets 214 150 - 400 K/uL  Protime-INR     Status: None   Collection Time: 04/30/17  3:29 AM  Result Value Ref Range   Prothrombin Time 14.8 11.4 - 15.2 seconds   INR 1.16    No results found.  Review of Systems  All other systems reviewed and are negative.   Blood pressure (!) 104/58, pulse 63, temperature 98 F (36.7 C), temperature source Oral, resp. rate 17, height 5' 2.25" (1.581 m), weight 47.1 kg (103 lb 14.4 oz), SpO2 100 %. Physical Exam  Constitutional: [name] is oriented to person, place, and time. [name] appears well-developed and well-nourished.  HENT:  Head: Atraumatic.  Eyes: EOM are normal.  Cardiovascular: Intact distal pulses.  Respiratory: Effort normal.  Musculoskeletal:  Left chest wall has a bandage with a small amount of fresh blood.  Around the shoulder in the region of her planned incision there is significant rash from the bandage of her recent surgery.  Neurological: [name] is alert and oriented to person, place, and time.  Skin: Skin is warm and dry.  Psychiatric: [name] has a normal mood and affect.     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 We will reschedule her surgery.  This was discussed with the patient and her husband.  Isabella Stalling, MD 04/30/2017, 9:22 AM

## 2017-04-30 NOTE — Progress Notes (Signed)
ANTICOAGULATION CONSULT NOTE - Follow Up Consult  Pharmacy Consult for Heparin and Coumadin Indication: atrial fibrillation and mechanical mitral valve and atrial fibrillation  Allergies  Allergen Reactions  . Ace Inhibitors Cough  . Lipitor [Atorvastatin Calcium] Other (See Comments)    Knee pain.  . Codeine Nausea And Vomiting    Patient Measurements: Height: 5' 2.25" (158.1 cm) Weight: 103 lb 14.4 oz (47.1 kg) IBW/kg (Calculated) : 50.68 Heparin Dosing Weight: 47 kg  Vital Signs: Temp: 98 F (36.7 C) (11/29 0529) Temp Source: Oral (11/29 0529) BP: 104/58 (11/29 0809) Pulse Rate: 63 (11/29 0809)  Labs: Recent Labs    04/28/17 1240 04/28/17 2151 04/29/17 0520 04/29/17 1304 04/29/17 1433 04/30/17 0329  HGB 11.2*  --  10.3*  --  10.2* 10.2*  HCT 35.1*  --  31.5*  --  31.2* 30.2*  PLT 249  --  241  --  231 214  APTT  --   --   --   --  DATE 04/29/17  --   LABPROT 19.5*  --  17.3*  --  16.1* 14.8  INR 1.67  --  1.43  --  1.31 1.16  HEPARINUNFRC  --  0.16* 0.39 0.58  --   --   CREATININE 1.42*  --  1.44*  --  1.34*  --     Estimated Creatinine Clearance: 27.4 mL/min (A) (by C-G formula based on SCr of 1.34 mg/dL (H)).  Assessment:    33 yof on Coumadin prior to admission for history of a mechanical valve replacement and paroxysmal atrial fibrillation. She follows for warfarin management with the outpatient anticoagulation clinic, goal 2.5-3.5 on regimen of 2.5 mg daily except 5 mg on Monday/Friday.  Coumadin held for ICD generator change on 11/28, and intended shoulder surgery today.   Was on IV heparin until prior to 11/28 ICD procedure.  Shoulder surgery cancelled for today as noted, and to resume anticoagulation.  INR down to 1.16.    Last heparin level was therapeutic (0.58) on 900 units/hr on 11/28.  Goal of Therapy:  Heparin level 0.3-0.7 units/ml INR 2.5-3.5 Monitor platelets by anticoagulation protocol: Yes   Plan:   Resume heparin drip at 900  units/hr.  Heparin level ~8 hrs after drip resumes.  Coumadin 5 mg x 1 today.  Daily heparin level, PT/INR, and CBC.  Discussed with patient.  Arty Baumgartner, [place] Pager: [phone] 04/30/2017,12:36 PM

## 2017-05-01 LAB — CBC
HCT: 31.5 % — ABNORMAL LOW (ref 36.0–46.0)
Hemoglobin: 10.2 g/dL — ABNORMAL LOW (ref 12.0–15.0)
MCH: 30.4 pg (ref 26.0–34.0)
MCHC: 32.4 g/dL (ref 30.0–36.0)
MCV: 93.8 fL (ref 78.0–100.0)
PLATELETS: 226 10*3/uL (ref 150–400)
RBC: 3.36 MIL/uL — ABNORMAL LOW (ref 3.87–5.11)
RDW: 15.6 % — AB (ref 11.5–15.5)
WBC: 6.3 10*3/uL (ref 4.0–10.5)

## 2017-05-01 LAB — PROTIME-INR
INR: 1.08
PROTHROMBIN TIME: 13.9 s (ref 11.4–15.2)

## 2017-05-01 LAB — APTT: aPTT: >200 seconds (ref 24–36)

## 2017-05-01 LAB — HEPARIN LEVEL (UNFRACTIONATED): Heparin Unfractionated: 0.46 IU/mL (ref 0.30–0.70)

## 2017-05-01 MED ORDER — OXYCODONE HCL 5 MG/5ML PO SOLN: 5.0000 mg | Freq: Once | ORAL | Status: DC | PRN

## 2017-05-01 MED ORDER — WARFARIN SODIUM 5 MG PO TABS
5.0000 mg | ORAL_TABLET | Freq: Once | ORAL | Status: AC
  Administered 2017-05-01: 5 mg via ORAL
  Filled 2017-05-01: qty 1

## 2017-05-01 MED ORDER — ONDANSETRON HCL 4 MG/2ML IJ SOLN: 4.0000 mg | Freq: Four times a day (QID) | INTRAMUSCULAR | Status: DC | PRN

## 2017-05-01 MED ORDER — FENTANYL CITRATE (PF) 100 MCG/2ML IJ SOLN: 25.0000 ug | INTRAMUSCULAR | Status: DC | PRN

## 2017-05-01 MED ORDER — OXYCODONE HCL 5 MG PO TABS: 5.0000 mg | ORAL_TABLET | Freq: Once | ORAL | Status: DC | PRN

## 2017-05-01 NOTE — Progress Notes (Signed)
Progress Note  Patient Name: [name] Date of Encounter: 05/01/2017  Primary Cardiologist: Daphne Karrer  Subjective   Shoulder surgery canceled. ICD site looks healthy. Petechial rash unchanged. No hematoma, no oozing. INR 1.08. ICD turned back on.  Inpatient Medications    Scheduled Meds: . allopurinol  300 mg Oral QPM  . buPROPion  100 mg Oral Daily  . famotidine  20 mg Oral Daily  . furosemide  40 mg Oral Daily  . hydrOXYzine  25 mg Oral QHS  . levothyroxine  50 mcg Oral QAC breakfast  . metoprolol succinate  25 mg Oral Daily  . multivitamin with minerals  1 tablet Oral Daily  . pravastatin  40 mg Oral QHS  . senna  1 tablet Oral QHS  . spironolactone  25 mg Oral Daily  . Warfarin - Pharmacist Dosing Inpatient   Does not apply q1800   Continuous Infusions: . heparin 900 Units/hr (04/30/17 1343)   PRN Meds: acetaminophen, albuterol, ALPRAZolam, fentaNYL (SUBLIMAZE) injection, ondansetron (ZOFRAN) IV, oxyCODONE **OR** oxyCODONE   Vital Signs    Vitals:   04/30/17 1300 04/30/17 2100 05/01/17 0332 05/01/17 0801  BP:  113/84 122/66 (!) 145/65  Pulse: 62 62 61 63  Resp: 18 15 19    Temp: 98.3 F (36.8 C) 98 F (36.7 C) 97.8 F (36.6 C)   TempSrc: Oral Oral Oral   SpO2: 100% 96% 98%   Weight:   105 lb 1.6 oz (47.7 kg)   Height:        Intake/Output Summary (Last 24 hours) at 05/01/2017 1048 Last data filed at 05/01/2017 0926 Gross per 24 hour  Intake 1230.15 ml  Output 3 ml  Net 1227.15 ml   Filed Weights   04/29/17 0439 04/30/17 0529 05/01/17 0332  Weight: 106 lb 1.6 oz (48.1 kg) 103 lb 14.4 oz (47.1 kg) 105 lb 1.6 oz (47.7 kg)    Telemetry    AsVp - Personally Reviewed  ECG    No new tracing - Personally Reviewed  Physical Exam  Very lean GEN: No acute distress.   Neck: No JVD Cardiac: RRR, no murmurs, rubs, or gallops. ICD site looks healthy Respiratory: Clear to auscultation bilaterally. GI: Soft, nontender, non-distended  MS: No  edema; No deformity. Neuro:  Nonfocal  Psych: Normal affect   Labs    Chemistry Recent Labs  Lab 04/28/17 1240 04/29/17 0520 04/29/17 1433  NA 137 136 136  K 4.2 4.0 4.2  CL 100* 103 104  CO2 29 25 27   GLUCOSE 77 85 83  BUN 19 19 15   CREATININE 1.42* 1.44* 1.34*  CALCIUM 9.7 9.7 9.3  PROT  --   --  6.5  ALBUMIN  --   --  3.4*  AST  --   --  28  ALT  --   --  14  ALKPHOS  --   --  82  BILITOT  --   --  0.3  GFRNONAA 35* 35* 38*  GFRAA 41* 40* 44*  ANIONGAP 8 8 5      Hematology Recent Labs  Lab 04/29/17 1433 04/30/17 0329 05/01/17 0403  WBC 5.9 7.5 6.3  RBC 3.29* 3.19* 3.36*  HGB 10.2* 10.2* 10.2*  HCT 31.2* 30.2* 31.5*  MCV 94.8 94.7 93.8  MCH 31.0 32.0 30.4  MCHC 32.7 33.8 32.4  RDW 15.7* 15.6* 15.6*  PLT 231 214 226    Patient Profile     76 y.o. female with mechanical mitral valve prosthesis admitted  for heparin "bridge" for surgery.  Assessment & Plan    Plan to keep on IV heparin until INR at least 2.0.  Reschedule shoulder procedure for early next year.  For questions or updates, please contact [place] Please consult [email or web address] for contact info under Cardiology/STEMI.      Signed, Sanda Klein, MD  05/01/2017, 10:48 AM

## 2017-05-01 NOTE — Progress Notes (Signed)
ANTICOAGULATION CONSULT NOTE - Follow Up Consult  Pharmacy Consult for Heparin and Coumadin Indication: atrial fibrillation and mechanical mitral valve and atrial fibrillation  Allergies  Allergen Reactions  . Ace Inhibitors Cough  . Lipitor [Atorvastatin Calcium] Other (See Comments)    Knee pain.  . Codeine Nausea And Vomiting    Patient Measurements: Height: 5' 2.25" (158.1 cm) Weight: 105 lb 1.6 oz (47.7 kg) IBW/kg (Calculated) : 50.68 Heparin Dosing Weight: 48 kg  Vital Signs: Temp: 97.8 F (36.6 C) (11/30 0332) Temp Source: Oral (11/30 0332) BP: 145/65 (11/30 0801) Pulse Rate: 63 (11/30 0801)  Labs: Recent Labs    04/28/17 1240  04/29/17 0520 04/29/17 1304 04/29/17 1433 04/30/17 0329 04/30/17 2032 05/01/17 0403  HGB 11.2*  --  10.3*  --  10.2* 10.2*  --  10.2*  HCT 35.1*  --  31.5*  --  31.2* 30.2*  --  31.5*  PLT 249  --  241  --  231 214  --  226  APTT  --   --   --   --  DATE 04/29/17  --   --   --   LABPROT 19.5*  --  17.3*  --  16.1* 14.8  --  13.9  INR 1.67  --  1.43  --  1.31 1.16  --  1.08  HEPARINUNFRC  --    < > 0.39 0.58  --   --  0.31 0.46  CREATININE 1.42*  --  1.44*  --  1.34*  --   --   --    < > = values in this interval not displayed.    Estimated Creatinine Clearance: 27.7 mL/min (A) (by C-G formula based on SCr of 1.34 mg/dL (H)).  Assessment:    48 yof on Coumadin prior to admission for history of a mechanical valve replacement and paroxysmal atrial fibrillation. She follows for warfarin management with the outpatient anticoagulation clinic, goal 2.5-3.5 on regimen of 2.5 mg daily except 5 mg on Mondays and Fridays.  Coumadin held for ICD generator change on 11/28, and intended shoulder surgery today.   Was on IV heparin until prior to 11/28 ICD procedure.  Shoulder surgery cancelled for 11/29 and anticoagulation resumed.    Heparin level remains therapeutic (0.46) on 900 units/hr.   INR 1.08. Still at baseline after Coumadin resumed  with 5 mg on 11/29.   Noted plan to stay in house until INR >2.  Goal of Therapy:  Heparin level 0.3-0.7 units/ml INR 2.5-3.5 Monitor platelets by anticoagulation protocol: Yes   Plan:   Continue heparin drip at 900 units/hr.  Coumadin 5 mg again today.  Daily heparin level, PT/INR, and CBC.  Discussed with patient and husband.  Arty Baumgartner, [place] Pager: [phone] 05/01/2017,11:46 AM

## 2017-05-02 LAB — CBC
HCT: 31.6 % — ABNORMAL LOW (ref 36.0–46.0)
Hemoglobin: 10.6 g/dL — ABNORMAL LOW (ref 12.0–15.0)
MCH: 31.5 pg (ref 26.0–34.0)
MCHC: 33.5 g/dL (ref 30.0–36.0)
MCV: 93.8 fL (ref 78.0–100.0)
PLATELETS: 205 10*3/uL (ref 150–400)
RBC: 3.37 MIL/uL — ABNORMAL LOW (ref 3.87–5.11)
RDW: 15.5 % (ref 11.5–15.5)
WBC: 8.8 10*3/uL (ref 4.0–10.5)

## 2017-05-02 LAB — PROTIME-INR
INR: 1.12
PROTHROMBIN TIME: 14.3 s (ref 11.4–15.2)

## 2017-05-02 LAB — HEPARIN LEVEL (UNFRACTIONATED): HEPARIN UNFRACTIONATED: 0.5 [IU]/mL (ref 0.30–0.70)

## 2017-05-02 MED ORDER — WARFARIN SODIUM 5 MG PO TABS
5.0000 mg | ORAL_TABLET | Freq: Once | ORAL | Status: AC
  Administered 2017-05-02: 5 mg via ORAL
  Filled 2017-05-02: qty 1

## 2017-05-02 NOTE — Plan of Care (Signed)
  Clinical Measurements: Ability to maintain clinical measurements within normal limits will improve 05/02/2017 2114 - Progressing by Irish Lack, RN   Health Behavior/Discharge Planning: Ability to manage health-related needs will improve 05/02/2017 2114 - Progressing by Irish Lack, RN

## 2017-05-02 NOTE — Progress Notes (Signed)
Progress Note  Patient Name: [name] Date of Encounter: 05/02/2017  Primary Cardiologist: Tevita Gomer  Subjective   No bleeding. INR 1.1.  Inpatient Medications    Scheduled Meds: . allopurinol  300 mg Oral QPM  . buPROPion  100 mg Oral Daily  . famotidine  20 mg Oral Daily  . furosemide  40 mg Oral Daily  . hydrOXYzine  25 mg Oral QHS  . levothyroxine  50 mcg Oral QAC breakfast  . metoprolol succinate  25 mg Oral Daily  . multivitamin with minerals  1 tablet Oral Daily  . pravastatin  40 mg Oral QHS  . senna  1 tablet Oral QHS  . spironolactone  25 mg Oral Daily  . warfarin  5 mg Oral ONCE-1800  . Warfarin - Pharmacist Dosing Inpatient   Does not apply q1800   Continuous Infusions: . heparin 900 Units/hr (05/01/17 1541)   PRN Meds: acetaminophen, albuterol, ALPRAZolam, fentaNYL (SUBLIMAZE) injection, ondansetron (ZOFRAN) IV, oxyCODONE **OR** oxyCODONE   Vital Signs    Vitals:   05/01/17 1359 05/01/17 2230 05/02/17 0500 05/02/17 0800  BP: (!) 121/55 (!) 119/49 123/71 127/89  Pulse: 66 69 66 68  Resp:      Temp: 98.2 F (36.8 C) 98.4 F (36.9 C) 98.6 F (37 C)   TempSrc: Oral Oral Oral   SpO2: 97% 97% 97%   Weight:   104 lb 6.4 oz (47.4 kg)   Height:        Intake/Output Summary (Last 24 hours) at 05/02/2017 1147 Last data filed at 05/02/2017 0845 Gross per 24 hour  Intake 806.4 ml  Output -  Net 806.4 ml   Filed Weights   04/30/17 0529 05/01/17 0332 05/02/17 0500  Weight: 103 lb 14.4 oz (47.1 kg) 105 lb 1.6 oz (47.7 kg) 104 lb 6.4 oz (47.4 kg)    ECG    No new tracing - Personally Reviewed  Physical Exam  Comfortable GEN: No acute distress.   Neck: No JVD Cardiac: RRR, no murmurs, rubs, or gallops. ICD site healthy, petechial rash fading Respiratory: Clear to auscultation bilaterally. GI: Soft, nontender, non-distended  MS: No edema; No deformity. Neuro:  Nonfocal  Psych: Normal affect   Labs    Chemistry Recent Labs  Lab  04/28/17 1240 04/29/17 0520 04/29/17 1433  NA 137 136 136  K 4.2 4.0 4.2  CL 100* 103 104  CO2 29 25 27   GLUCOSE 77 85 83  BUN 19 19 15   CREATININE 1.42* 1.44* 1.34*  CALCIUM 9.7 9.7 9.3  PROT  --   --  6.5  ALBUMIN  --   --  3.4*  AST  --   --  28  ALT  --   --  14  ALKPHOS  --   --  82  BILITOT  --   --  0.3  GFRNONAA 35* 35* 38*  GFRAA 41* 40* 44*  ANIONGAP 8 8 5      Hematology Recent Labs  Lab 04/30/17 0329 05/01/17 0403 05/02/17 0324  WBC 7.5 6.3 8.8  RBC 3.19* 3.36* 3.37*  HGB 10.2* 10.2* 10.6*  HCT 30.2* 31.5* 31.6*  MCV 94.7 93.8 93.8  MCH 32.0 30.4 31.5  MCHC 33.8 32.4 33.5  RDW 15.6* 15.6* 15.5  PLT 214 226 205    Patient Profile     76 y.o. female with mechanical mitral valve prosthesis admitted for heparin "bridge" for surgery.  Assessment & Plan    Waiting for warfarin effect  to kick in. INR at least 2.0 for DC.  For questions or updates, please contact [place] Please consult [email or web address] for contact info under Cardiology/STEMI.      Signed, Sanda Klein, MD  05/02/2017, 11:47 AM

## 2017-05-02 NOTE — Progress Notes (Signed)
ANTICOAGULATION CONSULT NOTE - Follow Up Consult  Pharmacy Consult for Heparin and Coumadin Indication: atrial fibrillation and mechanical mitral valve and atrial fibrillation  Allergies  Allergen Reactions  . Ace Inhibitors Cough  . Lipitor [Atorvastatin Calcium] Other (See Comments)    Knee pain.  . Codeine Nausea And Vomiting    Patient Measurements: Height: 5' 2.25" (158.1 cm) Weight: 104 lb 6.4 oz (47.4 kg) IBW/kg (Calculated) : 50.68 Heparin Dosing Weight: 48 kg  Vital Signs: Temp: 98.6 F (37 C) (12/01 0500) Temp Source: Oral (12/01 0500) BP: 127/89 (12/01 0800) Pulse Rate: 68 (12/01 0800)  Labs: Recent Labs    04/29/17 1433 04/30/17 0329 04/30/17 2032 05/01/17 0403 05/02/17 0324  HGB 10.2* 10.2*  --  10.2* 10.6*  HCT 31.2* 30.2*  --  31.5* 31.6*  PLT 231 214  --  226 205  APTT >200*  --   --   --   --   LABPROT 16.1* 14.8  --  13.9 14.3  INR 1.31 1.16  --  1.08 1.12  HEPARINUNFRC  --   --  0.31 0.46 0.50  CREATININE 1.34*  --   --   --   --     Estimated Creatinine Clearance: 27.6 mL/min (A) (by C-G formula based on SCr of 1.34 mg/dL (H)).  Assessment:    29 yof on Coumadin prior to admission for history of a mechanical valve replacement and paroxysmal atrial fibrillation. She follows for warfarin management with the outpatient anticoagulation clinic, goal 2.5-3.5 on regimen of 2.5 mg daily except 5 mg on Mondays and Fridays.  Coumadin held for ICD generator change on 11/28, and intended shoulder surgery today.   Was on IV heparin until prior to 11/28 ICD procedure.  Shoulder surgery cancelled for 11/29 and anticoagulation resumed.    Heparin level remains therapeutic (0.50) on 900 units/hr.   INR 1.12. Unchanged after Coumadin 5 mg x 2 days   Noted plan to stay in house until INR >2.  Goal of Therapy:  Heparin level 0.3-0.7 units/ml INR 2.5-3.5 Monitor platelets by anticoagulation protocol: Yes   Plan:   Continue heparin drip at 900 units/hr.  Coumadin 5 mg again today.  Daily heparin level, PT/INR, and CBC.  Discussed with patient and husband.  Arty Baumgartner, [place] Pager: [phone] 05/02/2017,9:48 AM

## 2017-05-03 LAB — PROTIME-INR
INR: 1.3
Prothrombin Time: 16.1 s — ABNORMAL HIGH (ref 11.4–15.2)

## 2017-05-03 LAB — HEPARIN LEVEL (UNFRACTIONATED): HEPARIN UNFRACTIONATED: 0.51 [IU]/mL (ref 0.30–0.70)

## 2017-05-03 LAB — CBC
HEMATOCRIT: 30.9 % — AB (ref 36.0–46.0)
Hemoglobin: 9.9 g/dL — ABNORMAL LOW (ref 12.0–15.0)
MCH: 30.4 pg (ref 26.0–34.0)
MCHC: 32 g/dL (ref 30.0–36.0)
MCV: 94.8 fL (ref 78.0–100.0)
PLATELETS: 229 10*3/uL (ref 150–400)
RBC: 3.26 MIL/uL — AB (ref 3.87–5.11)
RDW: 15.6 % — ABNORMAL HIGH (ref 11.5–15.5)
WBC: 7.1 10*3/uL (ref 4.0–10.5)

## 2017-05-03 MED ORDER — WARFARIN SODIUM 5 MG PO TABS
5.0000 mg | ORAL_TABLET | Freq: Once | ORAL | Status: AC
  Administered 2017-05-03: 5 mg via ORAL
  Filled 2017-05-03: qty 1

## 2017-05-03 NOTE — Progress Notes (Signed)
ANTICOAGULATION CONSULT NOTE - Follow Up Consult  Pharmacy Consult for Heparin and Coumadin Indication: atrial fibrillation and mechanical mitral valve and atrial fibrillation  Allergies  Allergen Reactions  . Ace Inhibitors Cough  . Lipitor [Atorvastatin Calcium] Other (See Comments)    Knee pain.  . Codeine Nausea And Vomiting    Patient Measurements: Height: 5' 2.25" (158.1 cm) Weight: 105 lb 8 oz (47.9 kg) IBW/kg (Calculated) : 50.68 Heparin Dosing Weight: 48 kg  Vital Signs: Temp: 97.9 F (36.6 C) (12/02 0620) Temp Source: Oral (12/02 0620) BP: 140/50 (12/02 0620) Pulse Rate: 61 (12/02 0620)  Labs: Recent Labs    05/01/17 0403 05/02/17 0324 05/03/17 0352  HGB 10.2* 10.6* 9.9*  HCT 31.5* 31.6* 30.9*  PLT 226 205 229  LABPROT 13.9 14.3 16.1*  INR 1.08 1.12 1.30  HEPARINUNFRC 0.46 0.50 0.51    Estimated Creatinine Clearance: 27.9 mL/min (A) (by C-G formula based on SCr of 1.34 mg/dL (H)).  Assessment:    34 yof on Coumadin prior to admission for history of a mechanical valve replacement and paroxysmal atrial fibrillation. She follows for warfarin management with the outpatient anticoagulation clinic, goal 2.5-3.5 on regimen of 2.5 mg daily except 5 mg on Mondays and Fridays.  Coumadin held for ICD generator change on 11/28, and intended shoulder surgery today.   Was on IV heparin until prior to 11/28 ICD procedure.  Shoulder surgery cancelled for 11/29 and anticoagulation resumed.    Heparin level remains therapeutic (0.51) on 900 units/hr.   INR up to 1.30 after Coumadin 5 mg x 3 days; slow to increase.   Noted plan to stay in house until INR >2.  Goal of Therapy:  Heparin level 0.3-0.7 units/ml INR 2.5-3.5 Monitor platelets by anticoagulation protocol: Yes   Plan:   Continue heparin drip at 900 units/hr.  Coumadin 5 mg again today.  Daily heparin level, PT/INR, and CBC.  Discussed with patient.   Arty Baumgartner, [place] Pager:  [phone] 05/03/2017,11:39 AM

## 2017-05-03 NOTE — Progress Notes (Signed)
Progress Note  Patient Name: [name] Date of Encounter: 05/03/2017  Primary Cardiologist: Davon Abdelaziz  Subjective   No cardiovascular complaints and no bleeding. INR 1.3  Inpatient Medications    Scheduled Meds: . allopurinol  300 mg Oral QPM  . buPROPion  100 mg Oral Daily  . famotidine  20 mg Oral Daily  . furosemide  40 mg Oral Daily  . hydrOXYzine  25 mg Oral QHS  . levothyroxine  50 mcg Oral QAC breakfast  . metoprolol succinate  25 mg Oral Daily  . multivitamin with minerals  1 tablet Oral Daily  . pravastatin  40 mg Oral QHS  . senna  1 tablet Oral QHS  . spironolactone  25 mg Oral Daily  . warfarin  5 mg Oral ONCE-1800  . Warfarin - Pharmacist Dosing Inpatient   Does not apply q1800   Continuous Infusions: . heparin 900 Units/hr (05/03/17 1046)   PRN Meds: acetaminophen, albuterol, ALPRAZolam, fentaNYL (SUBLIMAZE) injection, ondansetron (ZOFRAN) IV, oxyCODONE **OR** oxyCODONE   Vital Signs    Vitals:   05/02/17 0800 05/02/17 1327 05/02/17 2049 05/03/17 0620  BP: 127/89 127/67 (!) 117/45 (!) 140/50  Pulse: 68 61 65 61  Resp:  18 17 18   Temp:  97.8 F (36.6 C) 98.2 F (36.8 C) 97.9 F (36.6 C)  TempSrc:  Oral Oral Oral  SpO2:  100% 98% 96%  Weight:    105 lb 8 oz (47.9 kg)  Height:        Intake/Output Summary (Last 24 hours) at 05/03/2017 1243 Last data filed at 05/03/2017 1046 Gross per 24 hour  Intake 622.05 ml  Output -  Net 622.05 ml   Filed Weights   05/01/17 0332 05/02/17 0500 05/03/17 9191  Weight: 105 lb 1.6 oz (47.7 kg) 104 lb 6.4 oz (47.4 kg) 105 lb 8 oz (47.9 kg)    Telemetry    Atrial sensed biventricular paced - Personally Reviewed  ECG    No new trace - Personally Reviewed  Physical Exam  Looks comfortable, very slender. iCD site looks healthy GEN: No acute distress.   Neck: No JVD Cardiac: RRR, no murmurs, rubs, or gallops. Rash over her left upper shoulder continues to fade. Respiratory: Clear to auscultation  bilaterally. GI: Soft, nontender, non-distended  MS: No edema; No deformity. Neuro:  Nonfocal  Psych: Normal affect   Labs    Chemistry Recent Labs  Lab 04/28/17 1240 04/29/17 0520 04/29/17 1433  NA 137 136 136  K 4.2 4.0 4.2  CL 100* 103 104  CO2 29 25 27   GLUCOSE 77 85 83  BUN 19 19 15   CREATININE 1.42* 1.44* 1.34*  CALCIUM 9.7 9.7 9.3  PROT  --   --  6.5  ALBUMIN  --   --  3.4*  AST  --   --  28  ALT  --   --  14  ALKPHOS  --   --  82  BILITOT  --   --  0.3  GFRNONAA 35* 35* 38*  GFRAA 41* 40* 44*  ANIONGAP 8 8 5      Hematology Recent Labs  Lab 05/01/17 0403 05/02/17 0324 05/03/17 0352  WBC 6.3 8.8 7.1  RBC 3.36* 3.37* 3.26*  HGB 10.2* 10.6* 9.9*  HCT 31.5* 31.6* 30.9*  MCV 93.8 93.8 94.8  MCH 30.4 31.5 30.4  MCHC 32.4 33.5 32.0  RDW 15.6* 15.5 15.6*  PLT 226 205 229    Patient Profile  76 y.o.femalewith mechanical mitral valve prosthesis admitted for heparin "bridge" for surgery.  Assessment & Plan    INR starting to finally inch up, discharge when INR is at least 2.0, preferably 2.5.  For questions or updates, please contact [place] Please consult [email or web address] for contact info under Cardiology/STEMI.      Signed, Sanda Klein, MD  05/03/2017, 12:43 PM

## 2017-05-04 DIAGNOSIS — Z7901 Long term (current) use of anticoagulants: Secondary | ICD-10-CM

## 2017-05-04 LAB — HEPARIN LEVEL (UNFRACTIONATED): Heparin Unfractionated: 0.42 IU/mL (ref 0.30–0.70)

## 2017-05-04 LAB — PROTIME-INR
INR: 1.56
PROTHROMBIN TIME: 18.6 s — AB (ref 11.4–15.2)

## 2017-05-04 MED ORDER — ENSURE ENLIVE PO LIQD
237.0000 mL | Freq: Two times a day (BID) | ORAL | Status: DC
Start: 2017-05-04 — End: 2017-05-07
  Administered 2017-05-04 – 2017-05-06 (×4): 237 mL via ORAL

## 2017-05-04 MED ORDER — WARFARIN SODIUM 5 MG PO TABS
6.0000 mg | ORAL_TABLET | Freq: Once | ORAL | Status: AC
  Administered 2017-05-04: 18:00:00 6 mg via ORAL
  Filled 2017-05-04: qty 1

## 2017-05-04 NOTE — Progress Notes (Signed)
Progress Note  Patient Name: [name] Date of Encounter: 05/04/2017  Primary Cardiologist: Croitoru  Subjective   No problems w/ breathing, unable to eat much at a time, was eating more meals/day at home  Inpatient Medications    Scheduled Meds: . allopurinol  300 mg Oral QPM  . buPROPion  100 mg Oral Daily  . famotidine  20 mg Oral Daily  . furosemide  40 mg Oral Daily  . hydrOXYzine  25 mg Oral QHS  . levothyroxine  50 mcg Oral QAC breakfast  . metoprolol succinate  25 mg Oral Daily  . multivitamin with minerals  1 tablet Oral Daily  . pravastatin  40 mg Oral QHS  . senna  1 tablet Oral QHS  . spironolactone  25 mg Oral Daily  . Warfarin - Pharmacist Dosing Inpatient   Does not apply q1800   Continuous Infusions: . heparin 900 Units/hr (05/03/17 1046)   PRN Meds: acetaminophen, albuterol, ALPRAZolam, fentaNYL (SUBLIMAZE) injection, ondansetron (ZOFRAN) IV, oxyCODONE **OR** oxyCODONE   Vital Signs    Vitals:   05/03/17 1300 05/03/17 1955 05/04/17 0523 05/04/17 1014  BP: (!) 121/51 (!) 120/55 (!) 127/91 (!) 127/59  Pulse: (!) 59 64 (!) 58 62  Resp: 18 20 20    Temp: 98.3 F (36.8 C) 98.8 F (37.1 C) 98 F (36.7 C)   TempSrc: Oral Oral Oral   SpO2: 98% 100% 99%   Weight:   101 lb 8 oz (46 kg)   Height:        Intake/Output Summary (Last 24 hours) at 05/04/2017 1045 Last data filed at 05/04/2017 0900 Gross per 24 hour  Intake 1128.15 ml  Output -  Net 1128.15 ml   Filed Weights   05/02/17 0500 05/03/17 0620 05/04/17 0523  Weight: 104 lb 6.4 oz (47.4 kg) 105 lb 8 oz (47.9 kg) 101 lb 8 oz (46 kg)    Telemetry    Not on - Personally Reviewed  ECG    No new trace - Personally Reviewed  Physical Exam   GEN: NAD.   Neck: JVD - none Cardiac: RRR, crisp valve click and small murmur noted Respiratory: CTA bilaterally, ICD site no signs infection, rash fading GI: soft, NT, no distension MS: no edema, no deformity Neuro:  no focal  deficits Psych: normal affect   Labs    Chemistry Recent Labs  Lab 04/28/17 1240 04/29/17 0520 04/29/17 1433  NA 137 136 136  K 4.2 4.0 4.2  CL 100* 103 104  CO2 29 25 27   GLUCOSE 77 85 83  BUN 19 19 15   CREATININE 1.42* 1.44* 1.34*  CALCIUM 9.7 9.7 9.3  PROT  --   --  6.5  ALBUMIN  --   --  3.4*  AST  --   --  28  ALT  --   --  14  ALKPHOS  --   --  82  BILITOT  --   --  0.3  GFRNONAA 35* 35* 38*  GFRAA 41* 40* 44*  ANIONGAP 8 8 5      Hematology Recent Labs  Lab 05/01/17 0403 05/02/17 0324 05/03/17 0352  WBC 6.3 8.8 7.1  RBC 3.36* 3.37* 3.26*  HGB 10.2* 10.6* 9.9*  HCT 31.5* 31.6* 30.9*  MCV 93.8 93.8 94.8  MCH 30.4 31.5 30.4  MCHC 32.4 33.5 32.0  RDW 15.6* 15.5 15.6*  PLT 226 205 229   Lab Results  Component Value Date   INR 1.56 05/04/2017  INR 1.30 05/03/2017   INR 1.12 05/02/2017     Patient Profile     76 y.o.femalewith mechanical mitral valve prosthesis admitted 11/27 for heparin "bridge" for surgery. Pt had CRT-D gen change 11/28. However, shoulder surgery canceled due to reactive skin changes over CRT-D site and risk of infection.  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    Chronic combined systolic and diastolic heart failure (HCC) - volume status good    Malnutrition of moderate degree ([place]: 60% to less than 75% of standard weight) (HCC) - add Ensure bid    Pacemaker battery depletion - see above  For questions or updates, please contact [place] Please consult [email or web address] for contact info under Cardiology/STEMI.      Signed, Rosaria Ferries, PA-C  05/04/2017, 10:45 AM    Personally seen and examined. Agree with above.  76 year old with mechanical mitral valve.  Sharp click heard on exam.   Lungs are clear.  Had GEN change out.  She may be DC'd home when INR is 2.5.  Has a mechanical mitral valve, goal is 2.5-3.5 INR.  Candee Furbish, MD

## 2017-05-04 NOTE — Care Management Note (Signed)
Case Management Note  Patient Details  Name: [name] MRN: [id number] Date of Birth: [birth year]  Subjective/Objective:  Pt presented for Generator Change 04-29-17. Hx of Mechanical Mitral Valve on Heparin Coumadin Bridge. Awaiting therapeutic INR now at 1.56 goal 2.5-3.5.  PTA Independent from home with husband. Plan will be to return home once stable.                Action/Plan: CM did speak with patient and no home needs identified at this time.   Expected Discharge Date:                  Expected Discharge Plan:  Home/Self Care  In-House Referral:  NA  Discharge planning Services  CM Consult  Post Acute Care Choice:  NA Choice offered to:  NA  DME Arranged:  N/A DME Agency:  NA  HH Arranged:  NA HH Agency:  NA  Status of Service:  Completed, signed off  If discussed at [place] of Stay Meetings, dates discussed:    Additional Comments:  Bethena Roys, RN 05/04/2017, 12:10 PM

## 2017-05-04 NOTE — Care Management Important Message (Signed)
Important Message  Patient Details  Name: [name] MRN: [id number] Date of Birth: 1940/06/09   Medicare Important Message Given:  Yes    [name] 05/04/2017, 11:01 AM

## 2017-05-04 NOTE — Progress Notes (Signed)
ANTICOAGULATION CONSULT NOTE - Follow Up Consult  Pharmacy Consult for Heparin and Coumadin Indication: atrial fibrillation and mechanical mitral valve and atrial fibrillation  Allergies  Allergen Reactions  . Ace Inhibitors Cough  . Lipitor [Atorvastatin Calcium] Other (See Comments)    Knee pain.  . Codeine Nausea And Vomiting    Patient Measurements: Height: 5' 2.25" (158.1 cm) Weight: 101 lb 8 oz (46 kg) IBW/kg (Calculated) : 50.68 Heparin Dosing Weight: 48 kg  Vital Signs: Temp: 98 F (36.7 C) (12/03 0523) Temp Source: Oral (12/03 0523) BP: 127/91 (12/03 0523) Pulse Rate: 58 (12/03 0523)  Labs: Recent Labs    05/02/17 0324 05/03/17 0352 05/04/17 0448  HGB 10.6* 9.9*  --   HCT 31.6* 30.9*  --   PLT 205 229  --   LABPROT 14.3 16.1* 18.6*  INR 1.12 1.30 1.56  HEPARINUNFRC 0.50 0.51 0.42    Estimated Creatinine Clearance: 26.7 mL/min (A) (by C-G formula based on SCr of 1.34 mg/dL (H)).  Assessment: 85 yof on Coumadin prior to admission for history of a mechanical valve replacement and paroxysmal atrial fibrillation. She follows for warfarin management with the outpatient anticoagulation clinic, goal 2.5-3.5 on regimen of 2.5 mg daily except 5 mg on Mondays and Fridays. She was therapeutic on this regimen per outpatient visits.    Coumadin held for ICD generator change on 11/28, and intended shoulder surgery.  Patient was placed on IV heparin until prior to 11/28 ICD procedure.  Shoulder surgery was cancelled for 11/29 and full anticoagulation with IV heparin and Coumadin was resumed.  Today, Heparin level remains therapeutic (0.51) on 900 units/hr. INR up to 1.56 after Coumadin 5 mg x 4 days; slow to increase despite being double patient's home dose. Noted plan to stay in house until INR >2. No significant drug interactions noted. May reflect dietary changes - states unable to eat much here, was eating more meals/days at home.   Goal of Therapy:  Heparin level  0.3-0.7 units/ml INR 2.5-3.5 Monitor platelets by anticoagulation protocol: Yes   Plan:  Continue heparin drip at 900 units/hr. Increase Coumadin to 41m x1 today. Daily heparin level, PT/INR, and CBC.  JSloan Leiter PharmD, BCPS, BCCCP Clinical Pharmacist Clinical phone 05/04/2017 until 3:30PM [phone] hours, please call #[phone]/07/2016,8:08 AM

## 2017-05-05 LAB — PROTIME-INR
INR: 1.64
PROTHROMBIN TIME: 19.3 s — AB (ref 11.4–15.2)

## 2017-05-05 LAB — HEPARIN LEVEL (UNFRACTIONATED): Heparin Unfractionated: 0.46 IU/mL (ref 0.30–0.70)

## 2017-05-05 MED ORDER — WARFARIN SODIUM 5 MG PO TABS
6.0000 mg | ORAL_TABLET | Freq: Once | ORAL | Status: AC
  Administered 2017-05-05: 6 mg via ORAL
  Filled 2017-05-05: qty 1

## 2017-05-05 MED ORDER — WARFARIN SODIUM 7.5 MG PO TABS: 7.5000 mg | ORAL_TABLET | Freq: Once | ORAL | Status: DC

## 2017-05-05 NOTE — Progress Notes (Signed)
Progress Note  Patient Name: [name] Date of Encounter: 05/05/2017  Primary Cardiologist: Dr. Sallyanne Kuster   Subjective   Frustrated that INR is not yet therapeutic. No dyspnea. Has left shoulder pain.   Inpatient Medications    Scheduled Meds: . allopurinol  300 mg Oral QPM  . buPROPion  100 mg Oral Daily  . famotidine  20 mg Oral Daily  . feeding supplement (ENSURE ENLIVE)  237 mL Oral BID BM  . furosemide  40 mg Oral Daily  . hydrOXYzine  25 mg Oral QHS  . levothyroxine  50 mcg Oral QAC breakfast  . metoprolol succinate  25 mg Oral Daily  . multivitamin with minerals  1 tablet Oral Daily  . pravastatin  40 mg Oral QHS  . senna  1 tablet Oral QHS  . spironolactone  25 mg Oral Daily  . Warfarin - Pharmacist Dosing Inpatient   Does not apply q1800   Continuous Infusions: . heparin 900 Units/hr (05/03/17 1046)   PRN Meds: acetaminophen, albuterol, ALPRAZolam, fentaNYL (SUBLIMAZE) injection, ondansetron (ZOFRAN) IV, oxyCODONE **OR** oxyCODONE   Vital Signs    Vitals:   05/04/17 1300 05/04/17 1737 05/04/17 2109 05/05/17 0528  BP:  125/72 (!) 146/46 (!) 132/58  Pulse: 61 60 68 63  Resp: 19 18  18   Temp: 98.4 F (36.9 C) 98 F (36.7 C) 98.6 F (37 C) 97.8 F (36.6 C)  TempSrc: Oral Oral Oral   SpO2: 98% 98% 97% 97%  Weight:    104 lb 9.6 oz (47.4 kg)  Height:        Intake/Output Summary (Last 24 hours) at 05/05/2017 1039 Last data filed at 05/05/2017 0954 Gross per 24 hour  Intake 936 ml  Output -  Net 936 ml   Filed Weights   05/03/17 0620 05/04/17 0523 05/05/17 0528  Weight: 105 lb 8 oz (47.9 kg) 101 lb 8 oz (46 kg) 104 lb 9.6 oz (47.4 kg)    Telemetry    Not on tele - Personally Reviewed  ECG    AV dual paced - Personally Reviewed  Physical Exam   GEN: No acute distress.   Neck: No JVD Cardiac: RRR, no murmurs, rubs, or gallops.  Respiratory: Clear to auscultation bilaterally. GI: Soft, nontender, non-distended  MS: No edema; No  deformity. Neuro:  Nonfocal  Psych: Normal affect   Labs    Chemistry Recent Labs  Lab 04/28/17 1240 04/29/17 0520 04/29/17 1433  NA 137 136 136  K 4.2 4.0 4.2  CL 100* 103 104  CO2 29 25 27   GLUCOSE 77 85 83  BUN 19 19 15   CREATININE 1.42* 1.44* 1.34*  CALCIUM 9.7 9.7 9.3  PROT  --   --  6.5  ALBUMIN  --   --  3.4*  AST  --   --  28  ALT  --   --  14  ALKPHOS  --   --  82  BILITOT  --   --  0.3  GFRNONAA 35* 35* 38*  GFRAA 41* 40* 44*  ANIONGAP 8 8 5      Hematology Recent Labs  Lab 05/01/17 0403 05/02/17 0324 05/03/17 0352  WBC 6.3 8.8 7.1  RBC 3.36* 3.37* 3.26*  HGB 10.2* 10.6* 9.9*  HCT 31.5* 31.6* 30.9*  MCV 93.8 93.8 94.8  MCH 30.4 31.5 30.4  MCHC 32.4 33.5 32.0  RDW 15.6* 15.5 15.6*  PLT 226 205 229    Cardiac EnzymesNo results for input(s): TROPONINI  in the last 168 hours. No results for input(s): TROPIPOC in the last 168 hours.   BNPNo results for input(s): BNP, PROBNP in the last 168 hours.   DDimer No results for input(s): DDIMER in the last 168 hours.   Radiology    No results found.  Cardiac Studies   None   Patient Profile     76 y.o.femalewith mechanical mitral valve prosthesis admitted for heparin "bridge" for surgery. Pt initially scheduled for shoulder surgery 04/28/17, however surgery was canceled due to reactive skin changes over CRT-D site and risk of infection. Pt had CRT-D gen change on 11/28.   Assessment & Plan    1. 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 Followed by Dr. Barrie Dunker. Device pocket stable.   2. Mechanical Mitral Valve: on chronic a/c w/ coumadin. Getting heparin bridge. INR remains subtherapeutic at 1.64. Continue heparin until INR is 2.0-2.5. Pharmacy is assisting w/ dosing.   3. Chronic Combined Systolic and Diastolic CHF: volume stable on exam. No dyspnea.   For questions or updates, please contact [place] Please consult [email or web address] for contact info under Cardiology/STEMI.       Signed, Lyda Jester, PA-C  05/05/2017, 10:39 AM    Personally seen and examined. Agree with above.  Some frustration. Early on admit ate lots of greens.  INR not at goal. Pocket site normal BiV stable.   Candee Furbish, MD

## 2017-05-05 NOTE — Progress Notes (Addendum)
ANTICOAGULATION CONSULT NOTE - Follow Up Consult  Pharmacy Consult for Heparin and Coumadin Indication: atrial fibrillation and mechanical mitral valve and atrial fibrillation  Allergies  Allergen Reactions  . Ace Inhibitors Cough  . Lipitor [Atorvastatin Calcium] Other (See Comments)    Knee pain.  . Codeine Nausea And Vomiting    Patient Measurements: Height: 5' 2.25" (158.1 cm) Weight: 104 lb 9.6 oz (47.4 kg) IBW/kg (Calculated) : 50.68 Heparin Dosing Weight: 48 kg  Vital Signs: Temp: 97.8 F (36.6 C) (12/04 0528) BP: 149/57 (12/04 1057) Pulse Rate: 64 (12/04 1057)  Labs: Recent Labs    05/03/17 0352 05/04/17 0448 05/05/17 0422  HGB 9.9*  --   --   HCT 30.9*  --   --   PLT 229  --   --   LABPROT 16.1* 18.6* 19.3*  INR 1.30 1.56 1.64  HEPARINUNFRC 0.51 0.42 0.46    Estimated Creatinine Clearance: 27.6 mL/min (A) (by C-G formula based on SCr of 1.34 mg/dL (H)).  Assessment: 36 yof on Coumadin prior to admission for history of a mechanical valve replacement and paroxysmal atrial fibrillation. She follows for warfarin management with the outpatient anticoagulation clinic, goal 2.5-3.5 on regimen of 2.5 mg daily except 5 mg on Mondays and Fridays. She was therapeutic on this regimen per outpatient visits. I confirmed with [facility] that patient was prescribed the 2.57m tablets and patient confirms she was taking as prescribed by the Coumadin clinic.   Coumadin held for ICD generator change on 11/28, and intended shoulder surgery.  Patient was placed on IV heparin until prior to 11/28 ICD procedure.  Shoulder surgery was cancelled for 11/29 and full anticoagulation with IV heparin and Coumadin was resumed 11/29.  Today, Heparin level remains therapeutic (0.46) on 900 units/hr. INR is up to 1.64 after increased dose of 684myesterday but remains sub-therapeutic. INR has been slow to increase despite being > double patient's home dose. Noted plan to stay in house until INR >2. No  significant drug interactions noted. May reflect dietary changes - states unable to eat much here, was eating more meals/days at home. Patient did voice to me on 12/3 that she ate greens at the beginning of her stay after not having them for a long time, however she has been counseled to avoid and has been trying to follow recommendations. She is frustrated that the INR is not therapeutic. RN is placing a note to dietary to not send greens.   [facility]. She states that Ms. Cousineau's INR levels have been a challenge and she will be sub-therapeutic one week then supra-therapeutic another. Knowing this, I will give her one more day of 26m29mnd see if we get a response. At some point, I feel we will have to back down the dosing due to an inevitable sharp rise in INR.   Goal of Therapy:  Heparin level 0.3-0.7 units/ml INR 2.5-3.5 Monitor platelets by anticoagulation protocol: Yes   Plan:  Continue heparin drip at 900 units/hr. Continue with Coumadin 6 mg x1 again today. Daily heparin level, PT/INR, and CBC.  JesSloan LeiterharmD, BCPS, BCCCP Clinical Pharmacist Clinical phone 05/05/2017 until 3:30PM - #[phone] hours, please call #[phone] 05/05/2017,11:13 AM

## 2017-05-06 LAB — PROTIME-INR
INR: 1.84
PROTHROMBIN TIME: 21.1 s — AB (ref 11.4–15.2)

## 2017-05-06 LAB — HEPARIN LEVEL (UNFRACTIONATED): HEPARIN UNFRACTIONATED: 0.47 [IU]/mL (ref 0.30–0.70)

## 2017-05-06 MED ORDER — WARFARIN SODIUM 1 MG PO TABS
6.0000 mg | ORAL_TABLET | Freq: Once | ORAL | Status: AC
  Administered 2017-05-06: 6 mg via ORAL
  Filled 2017-05-06: qty 1

## 2017-05-06 NOTE — Progress Notes (Signed)
Progress Note  Patient Name: [name] Date of Encounter: 05/06/2017  Primary Cardiologist: Dr. Sallyanne Kuster   Subjective   No complaints. Eating lunch. Still waiting for INR to trend up.   Inpatient Medications    Scheduled Meds: . allopurinol  300 mg Oral QPM  . buPROPion  100 mg Oral Daily  . famotidine  20 mg Oral Daily  . feeding supplement (ENSURE ENLIVE)  237 mL Oral BID BM  . furosemide  40 mg Oral Daily  . hydrOXYzine  25 mg Oral QHS  . levothyroxine  50 mcg Oral QAC breakfast  . metoprolol succinate  25 mg Oral Daily  . multivitamin with minerals  1 tablet Oral Daily  . pravastatin  40 mg Oral QHS  . senna  1 tablet Oral QHS  . spironolactone  25 mg Oral Daily  . warfarin  6 mg Oral ONCE-1800  . Warfarin - Pharmacist Dosing Inpatient   Does not apply q1800   Continuous Infusions: . heparin 900 Units/hr (05/06/17 0831)   PRN Meds: acetaminophen, albuterol, ALPRAZolam, fentaNYL (SUBLIMAZE) injection, ondansetron (ZOFRAN) IV, oxyCODONE **OR** oxyCODONE   Vital Signs    Vitals:   05/05/17 1057 05/05/17 1425 05/05/17 2110 05/06/17 0530  BP: (!) 149/57 (!) 112/56 (!) 134/46 (!) 133/49  Pulse: 64 64 66 64  Resp:  16    Temp:  98.4 F (36.9 C) 98.7 F (37.1 C) 98.4 F (36.9 C)  TempSrc:  Oral Oral Oral  SpO2:  99% 96% 97%  Weight:    105 lb 4.8 oz (47.8 kg)  Height:        Intake/Output Summary (Last 24 hours) at 05/06/2017 1147 Last data filed at 05/06/2017 0831 Gross per 24 hour  Intake 1468.8 ml  Output 3 ml  Net 1465.8 ml   Filed Weights   05/04/17 0523 05/05/17 0528 05/06/17 0530  Weight: 101 lb 8 oz (46 kg) 104 lb 9.6 oz (47.4 kg) 105 lb 4.8 oz (47.8 kg)    Telemetry    Not on tele - Personally Reviewed  ECG    AV dual paced - Personally Reviewed  Physical Exam   GEN: No acute distress.   Neck: No JVD Cardiac: RRR, no murmurs, rubs, or gallops.  Respiratory: Clear to auscultation bilaterally. GI: Soft, nontender, non-distended    MS: No edema; No deformity. Neuro:  Nonfocal  Psych: Normal affect   Labs    Chemistry Recent Labs  Lab 04/29/17 1433  NA 136  K 4.2  CL 104  CO2 27  GLUCOSE 83  BUN 15  CREATININE 1.34*  CALCIUM 9.3  PROT 6.5  ALBUMIN 3.4*  AST 28  ALT 14  ALKPHOS 82  BILITOT 0.3  GFRNONAA 38*  GFRAA 44*  ANIONGAP 5     Hematology Recent Labs  Lab 05/01/17 0403 05/02/17 0324 05/03/17 0352  WBC 6.3 8.8 7.1  RBC 3.36* 3.37* 3.26*  HGB 10.2* 10.6* 9.9*  HCT 31.5* 31.6* 30.9*  MCV 93.8 93.8 94.8  MCH 30.4 31.5 30.4  MCHC 32.4 33.5 32.0  RDW 15.6* 15.5 15.6*  PLT 226 205 229    Cardiac EnzymesNo results for input(s): TROPONINI in the last 168 hours. No results for input(s): TROPIPOC in the last 168 hours.   BNPNo results for input(s): BNP, PROBNP in the last 168 hours.   DDimer No results for input(s): DDIMER in the last 168 hours.   Radiology    No results found.  Cardiac Studies  None this admission   Patient Profile     76 y.o.femalewith mechanical mitral valve prosthesis admitted for heparin "bridge" for surgery. Pt initially scheduled for shoulder surgery 04/28/17, however surgery was canceled due to reactive skin changes over CRT-D site and risk of infection. Pt had CRT-D gen change on 11/28.    Assessment & Plan    1. Bi Ventricular ICD: recently reached ERI, s/p gen change 04/29/17. Device  Pocket is stable.  This is followed by Dr. Sallyanne Kuster.   2. Mechanical Mitral Valve: on a/c with Coumadin. INR remains subtherapeutic at 1.8. Continue IV heparin bridge. Appreciate pharmacy's assistance with dosing.   3. Chronic Combined Systolic and Diastolic CHF: volume is stable. No dyspnea.   For questions or updates, please contact [place] Please consult [email or web address] for contact info under Cardiology/STEMI.      Signed, Lyda Jester, PA-C  05/06/2017, 11:47 AM    Personally seen and examined. Agree with above. Still waiting for INR to become  therapeutic, 2.5.  Slow going.  Candee Furbish, MD

## 2017-05-06 NOTE — Progress Notes (Addendum)
ANTICOAGULATION CONSULT NOTE - Follow Up Consult  Pharmacy Consult for Heparin and Coumadin Indication: atrial fibrillation and mechanical mitral valve  Allergies  Allergen Reactions  . Ace Inhibitors Cough  . Lipitor [Atorvastatin Calcium] Other (See Comments)    Knee pain.  . Codeine Nausea And Vomiting    Patient Measurements: Height: 5' 2.25" (158.1 cm) Weight: 105 lb 4.8 oz (47.8 kg) IBW/kg (Calculated) : 50.68 Heparin Dosing Weight: 48 kg  Vital Signs: Temp: 98.4 F (36.9 C) (12/05 0530) Temp Source: Oral (12/05 0530) BP: 133/49 (12/05 0530) Pulse Rate: 64 (12/05 0530)  Labs: Recent Labs    05/04/17 0448 05/05/17 0422 05/06/17 0420  LABPROT 18.6* 19.3* 21.1*  INR 1.56 1.64 1.84  HEPARINUNFRC 0.42 0.46 0.47    Assessment: 75 yof on Coumadin prior to admission for history of a mechanical valve replacement and paroxysmal atrial fibrillation. She follows for warfarin management with the outpatient anticoagulation clinic, goal 2.5-3.5 on regimen of 2.5 mg daily except 5 mg on Mondays and Fridays. She was therapeutic on this regimen per outpatient visits. [facility] patient was prescribed the 2.56m tablets and patient confirms she was taking as prescribed by the Coumadin clinic.   Coumadin held for ICD generator change on 11/28, and intended shoulder surgery.  Patient was placed on IV heparin until prior to 11/28 ICD procedure.  Shoulder surgery was cancelled for 11/29 and full anticoagulation with IV heparin and Coumadin was resumed 11/29.  Today, heparin level remains therapeutic (0.47) on 900 units/hr. INR is up to 1.84 from 1.64 following 2 days of 5 mg dose being given.    D[facility]. She states that Ms. Carlino's INR levels have been a challenge and she will be sub-therapeutic one week then supra-therapeutic another. Knowing this, I will give her one more day of 653mand see if we get a response. At some point,  I feel we will have to back down the dosing due to an inevitable sharp rise in INR.   Goal of Therapy:  Heparin level 0.3-0.7 units/ml INR 2.5-3.5 Monitor platelets by anticoagulation protocol: Yes   Plan:  1. Continue heparin drip at 900 units/hr. 2. Continue with Coumadin 6 mg x1 again today. 3. Daily heparin level, PT/INR, and CBC.  AnVincenza HewsPharmD, BCPS Clinical phone 05/06/2017 until 3:30PM - #[phone] hours, please call #[phone] 05/06/2017,10:53 AM

## 2017-05-06 NOTE — Care Management Important Message (Signed)
Important Message  Patient Details  Name: [name] MRN: [id number] Date of Birth: 1940/12/23   Medicare Important Message Given:  Yes    Nathen May 05/06/2017, 10:34 AM

## 2017-05-07 LAB — CBC WITH DIFFERENTIAL/PLATELET
Basophils Absolute: 0 K/uL (ref 0.0–0.1)
Basophils Relative: 0 %
Eosinophils Absolute: 0.2 K/uL (ref 0.0–0.7)
Eosinophils Relative: 2 %
HCT: 33.8 % — ABNORMAL LOW (ref 36.0–46.0)
Hemoglobin: 10.9 g/dL — ABNORMAL LOW (ref 12.0–15.0)
Lymphocytes Relative: 11 %
Lymphs Abs: 1 K/uL (ref 0.7–4.0)
MCH: 30.7 pg (ref 26.0–34.0)
MCHC: 32.2 g/dL (ref 30.0–36.0)
MCV: 95.2 fL (ref 78.0–100.0)
Monocytes Absolute: 0.9 K/uL (ref 0.1–1.0)
Monocytes Relative: 10 %
Neutro Abs: 6.8 K/uL (ref 1.7–7.7)
Neutrophils Relative %: 77 %
Platelets: 270 K/uL (ref 150–400)
RBC: 3.55 MIL/uL — ABNORMAL LOW (ref 3.87–5.11)
RDW: 15.6 % — ABNORMAL HIGH (ref 11.5–15.5)
WBC: 8.9 K/uL (ref 4.0–10.5)

## 2017-05-07 LAB — PROTIME-INR
INR: 2.09
Prothrombin Time: 23.3 s — ABNORMAL HIGH (ref 11.4–15.2)

## 2017-05-07 LAB — HEPARIN LEVEL (UNFRACTIONATED): HEPARIN UNFRACTIONATED: 0.37 [IU]/mL (ref 0.30–0.70)

## 2017-05-07 MED ORDER — FUROSEMIDE 40 MG PO TABS: 40.0000 mg | ORAL_TABLET | Freq: Every day | ORAL | 9 refills | Status: DC

## 2017-05-07 MED ORDER — METOPROLOL SUCCINATE ER 25 MG PO TB24: 25.0000 mg | ORAL_TABLET | Freq: Every day | ORAL | 11 refills | Status: DC

## 2017-05-07 MED ORDER — ENSURE ENLIVE PO LIQD: 237.0000 mL | Freq: Two times a day (BID) | ORAL | 12 refills | Status: DC

## 2017-05-07 NOTE — Plan of Care (Signed)
  Education: Knowledge of General Education information will improve 05/07/2017 1154 - Completed/Met by Shanon Rosser, RN   Health Behavior/Discharge Planning: Ability to manage health-related needs will improve 05/07/2017 1154 - Completed/Met by Shanon Rosser, RN   Clinical Measurements: Ability to maintain clinical measurements within normal limits will improve 05/07/2017 1154 - Completed/Met by Shanon Rosser, RN   Clinical Measurements: Ability to maintain clinical measurements within normal limits will improve 05/07/2017 1154 - Completed/Met by Shanon Rosser, RN Will remain free from infection 05/07/2017 1154 - Completed/Met by Shanon Rosser, RN Diagnostic test results will improve 05/07/2017 1154 - Completed/Met by Shanon Rosser, RN Cardiovascular complication will be avoided 05/07/2017 1154 - Completed/Met by Shanon Rosser, RN   Activity: Risk for activity intolerance will decrease 05/07/2017 1154 - Completed/Met by Shanon Rosser, RN   Nutrition: Adequate nutrition will be maintained 05/07/2017 1154 - Completed/Met by Shanon Rosser, RN   Coping: Level of anxiety will decrease 05/07/2017 1154 - Completed/Met by Shanon Rosser, RN   Safety: Ability to remain free from injury will improve 05/07/2017 1154 - Completed/Met by Shanon Rosser, RN   Skin Integrity: Risk for impaired skin integrity will decrease 05/07/2017 1154 - Completed/Met by Shanon Rosser, RN

## 2017-05-07 NOTE — Progress Notes (Signed)
Progress Note  Patient Name: [name] Date of Encounter: 05/07/2017  Primary Cardiologist: Dr. Sallyanne Kuster   Subjective   Feels well, wants to go home.  Inpatient Medications    Scheduled Meds: . allopurinol  300 mg Oral QPM  . buPROPion  100 mg Oral Daily  . famotidine  20 mg Oral Daily  . feeding supplement (ENSURE ENLIVE)  237 mL Oral BID BM  . furosemide  40 mg Oral Daily  . hydrOXYzine  25 mg Oral QHS  . levothyroxine  50 mcg Oral QAC breakfast  . metoprolol succinate  25 mg Oral Daily  . multivitamin with minerals  1 tablet Oral Daily  . pravastatin  40 mg Oral QHS  . senna  1 tablet Oral QHS  . spironolactone  25 mg Oral Daily  . Warfarin - Pharmacist Dosing Inpatient   Does not apply q1800   Continuous Infusions: . heparin 900 Units/hr (05/06/17 0831)   PRN Meds: acetaminophen, albuterol, ALPRAZolam, fentaNYL (SUBLIMAZE) injection, ondansetron (ZOFRAN) IV, oxyCODONE **OR** oxyCODONE   Vital Signs    Vitals:   05/06/17 0530 05/06/17 1400 05/06/17 1955 05/07/17 0614  BP: (!) 133/49 (!) 136/50 (!) 130/47 (!) 138/49  Pulse: 64 64 67 (!) 59  Resp:   18 16  Temp: 98.4 F (36.9 C) 98.2 F (36.8 C) 98.1 F (36.7 C) 97.9 F (36.6 C)  TempSrc: Oral Oral Oral Oral  SpO2: 97% 98% 97% 97%  Weight: 105 lb 4.8 oz (47.8 kg)   105 lb 9.6 oz (47.9 kg)  Height:        Intake/Output Summary (Last 24 hours) at 05/07/2017 0744 Last data filed at 05/07/2017 0000 Gross per 24 hour  Intake 330 ml  Output -  Net 330 ml   Filed Weights   05/05/17 0528 05/06/17 0530 05/07/17 0614  Weight: 104 lb 9.6 oz (47.4 kg) 105 lb 4.8 oz (47.8 kg) 105 lb 9.6 oz (47.9 kg)    Telemetry    Not on tele - Personally Reviewed  ECG    AV dual paced - Personally Reviewed  Physical Exam   GEN: WD, WN elderly female, NAD.   Neck: JVD not elevated Cardiac: Reg R&R, crisp valve click and SEM noted.  Respiratory: CTA bilaterally, incision good, no S&S infection GI: +BS, NT, not  distended, soft  MS: no edema, pulses intact Neuro: no focal deficits Psych: nl affect  Labs    Chemistry No results for input(s): NA, K, CL, CO2, GLUCOSE, BUN, CREATININE, CALCIUM, PROT, ALBUMIN, AST, ALT, ALKPHOS, BILITOT, GFRNONAA, GFRAA, ANIONGAP in the last 168 hours.   Hematology Recent Labs  Lab 05/02/17 0324 05/03/17 0352 05/07/17 0529  WBC 8.8 7.1 8.9  RBC 3.37* 3.26* 3.55*  HGB 10.6* 9.9* 10.9*  HCT 31.6* 30.9* 33.8*  MCV 93.8 94.8 95.2  MCH 31.5 30.4 30.7  MCHC 33.5 32.0 32.2  RDW 15.5 15.6* 15.6*  PLT 205 229 270   Lab Results  Component Value Date   INR 2.09 05/07/2017   INR 1.84 05/06/2017   INR 1.64 05/05/2017   Radiology    No results found.  Cardiac Studies   None this admission   Patient Profile     76 y.o.femalewith mechanical mitral valve prosthesis admitted for heparin "bridge" for surgery. Pt initially scheduled for shoulder surgery 04/28/17, however surgery was canceled due to reactive skin changes over CRT-D site and risk of infection. Pt had CRT-D gen change on 11/28.    Assessment &  Plan    1. Bi Ventricular ICD:  - s/p gen change 11/28 - pocket stable - will need f/u to get staples removed   2. Mechanical Mitral Valve:  - INR coming up slowly, now >2 - MD advise if pt has to be 2.5 before d/c  - on IV heparin - Pharmacy managing.   3. Chronic Combined Systolic and Diastolic CHF:  - wt 888 today, stable - no SOB  For questions or updates, please contact [place] Please consult [email or web address] for contact info under Cardiology/STEMI.      Signed, Rosaria Ferries, PA-C  05/07/2017, 7:44 AM    Personally seen and examined. Agree with above.  - Spoke with Dr. Loletha Grayer. OK with DC with current INR. Close follow up [facility] coumadin clinic. Pacer site c/d/i.   Candee Furbish, MD

## 2017-05-07 NOTE — Progress Notes (Signed)
ANTICOAGULATION CONSULT NOTE - Follow Up Consult  Pharmacy Consult for Heparin and Coumadin Indication: atrial fibrillation and mechanical mitral valve  Allergies  Allergen Reactions  . Ace Inhibitors Cough  . Lipitor [Atorvastatin Calcium] Other (See Comments)    Knee pain.  . Codeine Nausea And Vomiting    Patient Measurements: Height: 5' 2.25" (158.1 cm) Weight: 105 lb 9.6 oz (47.9 kg) IBW/kg (Calculated) : 50.68 Heparin Dosing Weight: 48 kg  Vital Signs: Temp: 97.9 F (36.6 C) (12/06 0614) Temp Source: Oral (12/06 0614) BP: 138/49 (12/06 1884) Pulse Rate: 59 (12/06 0614)  Labs: Recent Labs    05/05/17 0422 05/06/17 0420 05/07/17 0529  HGB  --   --  10.9*  HCT  --   --  33.8*  PLT  --   --  270  LABPROT 19.3* 21.1* 23.3*  INR 1.64 1.84 2.09  HEPARINUNFRC 0.46 0.47 0.37    Assessment: 33 yof on Coumadin prior to admission for history of a mechanical valve replacement and paroxysmal atrial fibrillation. She follows for warfarin management with the outpatient anticoagulation clinic, goal 2.5-3.5 on regimen of 2.5 mg daily except 5 mg on Mondays and Fridays. She was therapeutic on this regimen per outpatient visits. [facility] was [facility] the 2.23m tablets and patient confirms she was taking as prescribed by the Coumadin clinic.   Coumadin held for ICD generator change on 11/28, and intended shoulder surgery.  Patient was placed on IV heparin until prior to 11/28 ICD procedure.  Shoulder surgery was cancelled for 11/29 and full anticoagulation with IV heparin and Coumadin was resumed 11/29.  Heparin level remains therapeutic. INR continues to trend up albeit slowly.   D[facility] She states that Ms. Flinchum's INR levels have been a challenge and she will be sub-therapeutic one week then supra-therapeutic another. Knowing this, I will give her one more day of 618mand see if we get a response. At some  point, I feel we will have to back down the dosing due to an inevitable sharp rise in INR.   Goal of Therapy:  Heparin level 0.3-0.7 units/ml INR 2.5-3.5 Monitor platelets by anticoagulation protocol: Yes   Plan:  1. Home today per notes; would resume PTA warfarin dosing at discharge. I will alert the Coumadin Clinic of her pending discharge so that close follow up can be arranged.  AnVincenza HewsPharmD, BCPS Clinical phone 05/06/2017 until 3:30PM - [phone] hours, please call #[phone] 05/06/2017,10:53 AM

## 2017-05-07 NOTE — Discharge Summary (Signed)
Discharge Summary    Patient ID: [name],  MRN: [id number], DOB/AGE: July 11, 1940 76 y.o.  Admit date: 04/28/2017 Discharge date: 05/07/2017  Primary Care Provider: Leanna Battles Primary Cardiologist: Dr. 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 Ace Inhibitors Cough  . Lipitor [Atorvastatin Calcium] Other (See Comments)    Knee pain.  . Codeine Nausea And Vomiting    Diagnostic Studies/Procedures    BIV-ICD generator change out 04/29/2017:  Procedure performed: 1.CRT-Dgenerator changeout  2. Light sedation  Reason for procedure: 1. Device generator at elective replacement interval 2. Chronic combined systolic and diastolic heart failure - CRT "hyper-responder" Procedure performed by: Sanda Klein, MD  Complications: None  Estimated blood loss: <5 mL  Medications administered during procedure: Ancef2g intravenously, lidocaine 1% 50 mL locally, fentanyl28mg intravenously, Versed 182mintravenously Device details:  New GeneratorMedtronic Clariamodel number [id number], serial [id number] Right atrial lead (chronic)Medtronic, model [id number] [id number](implanted 07/23/2006) Right ventricular lead (chronic)St [name] Riata, model [id number], serial [id number](implanted 07/23/2006) Left ventricular leadBoston Scientific, model [id number] [id number](implanted 07/23/2006) Explanted generatorMedtronic Protecta, model number [id number] [id number](implanted 04/10/2011)   History of Present Illness     76y.o.femalewith mechanical mitral valve prosthesis (2002, 2007) admitted for heparin "bridge" for 2 procedures on 04/28/2017.  Hospital Course    Consultants:  None  She was electively admitted 04/28/2017 for heparin bridge for two surgical procedures; BIV-generator change-out and a left total shoulder replacement.  She had the gen change on 1167/20no complications. However, due to local skin changes at her CRT-D surgical site on 04/29/17, ortho chose to cancel her shoulder surgery during this admission.   Due to her mechanical valve, she is on warfarin, management by the outpatient anticoagulation clinic. Goals INR 2.5-3.5  Warfarin was held for CRT-D generator change on 11/28 and intended shoulder surgery 04/30/17. She was bridged with IV Heparin therapy.  Her hospital course has been complicated by slow to increase INR levels post procedure. Per Dr. CrSallyanne Kusterhe is ok with discharging the patient with an INR of 2.0, however would prefer 2.5. [facility] close monitoring [facility] An appointment has been arranged.   Pt has been seen and examined by Dr. SkMarlou Porch1/06 who feels that she is stable for discharge with close follow up arranged.  Discharge Vitals Blood pressure (!) 138/49, pulse (!) 59, temperature 97.9 F (36.6 C), temperature source Oral, resp. rate 16, height 5' 2.25" (1.581 m), weight 105 lb 9.6 oz (47.9 kg), SpO2 97 %.  Filed Weights   05/05/17 0528 05/06/17 0530 05/07/17 0614  Weight: 104 lb 9.6 oz (47.4 kg) 105 lb 4.8 oz (47.8 kg) 105 lb 9.6 oz (47.9 kg)    Labs & Radiologic Studies    CBC Recent Labs    05/07/17 0529  WBC 8.9  NEUTROABS 6.8  HGB 10.9*  HCT 33.8*  MCV 95.2  PLT 27947 Basic Metabolic Panel BMET    Component Value Date/Time   NA 136 04/29/2017 1433   NA 137 04/08/2017 1247   K 4.2 04/29/2017 1433   CL 104  04/29/2017 1433   CO2 27 04/29/2017 1433   GLUCOSE 83 04/29/2017 1433   BUN 15  04/29/2017 1433   BUN 25 04/08/2017 1247   CREATININE 1.34 (H) 04/29/2017 1433   CREATININE 1.14 (H) 03/24/2016 1101   CALCIUM 9.3 04/29/2017 1433   GFRNONAA 38 (L) 04/29/2017 1433   GFRAA 44 (L) 04/29/2017 1433   _____________   Radiology: None this admission  Disposition   Pt is being discharged home today in good condition.  Follow-up Plans & Appointments    Follow-up Information    Croitoru, Mihai, MD Follow up on 08/19/2017.   Specialty:  Cardiology Why:  Please arrive at 10:00 for a 10:20 appt  Contact information: [address] [place] [address] [phone]        [place] [facility] Follow up on 05/20/2017.   Specialty:  Cardiology Why:  Device clinic appt and staple removal. Please arrive at 1:45 pm for a 2:00 pm appt. Contact information: [address], [place] Follow up on 05/15/2017.   Specialty:  Cardiology Why:  Coumadin check at 11:30 am Contact information: [address] [place] [phone]          Discharge Medications   Allergies as of 05/07/2017      Reactions   Ace Inhibitors Cough   Lipitor [atorvastatin Calcium] Other (See Comments)   Knee pain.   Codeine Nausea And Vomiting      Medication List    STOP taking these medications   metoprolol tartrate 25 MG tablet Commonly known as:  LOPRESSOR     TAKE these medications   allopurinol 300 MG tablet Commonly known as:  ZYLOPRIM Take 300 mg by mouth every evening.   ALPRAZolam 0.25 MG tablet Commonly known as:  XANAX Take 0.25 mg by mouth 3 (three) times daily as needed for anxiety.   amoxicillin 500 MG capsule Commonly known as:  AMOXIL Take 4 capsules 1 hour prior to dental appointment.   buPROPion 100 MG tablet Commonly known as:  WELLBUTRIN Take 100 mg by mouth daily.   feeding supplement (ENSURE ENLIVE)  Liqd Take 237 mLs by mouth 2 (two) times daily between meals.   furosemide 40 MG tablet Commonly known as:  LASIX Take 1 tablet (40 mg total) by mouth daily. Take extra 40 mg daily prn for weight gain 3lbs/day or 5 lbs/week or if you wake up with edema What changed:  See the new instructions.   hydrOXYzine 25 MG capsule Commonly known as:  VISTARIL Take 25 mg by mouth at bedtime.   ibuprofen 200 MG tablet Commonly known as:  ADVIL,MOTRIN Take 200 mg daily as needed by mouth for moderate pain.   metoprolol succinate 25 MG 24 hr tablet Commonly known as:  TOPROL-XL Take 1 tablet (25 mg total) by mouth daily. Start taking on:  05/08/2017   multivitamins ther. w/minerals Tabs tablet Take 1 tablet by mouth every morning.   ondansetron 4 MG tablet Commonly known as:  ZOFRAN Take 1 tablet (4 mg total) by mouth 3 (three) times daily.   pantoprazole 40 MG tablet Commonly known as:  PROTONIX Take 1 tablet (40 mg total) by mouth 2 (two) times daily before a meal.   pravastatin 40 MG tablet Commonly known as:  PRAVACHOL Take 40 mg at bedtime by mouth.   PROAIR HFA 108 (90 Base) MCG/ACT  inhaler Generic drug:  albuterol Inhale 1-2 puffs into the lungs every 6 (six) hours as needed for wheezing.   ranitidine 150 MG tablet Commonly known as:  ZANTAC Take 150 mg daily by mouth.   Senna 15 MG Tabs Take 1 tablet at bedtime by mouth.   spironolactone 25 MG tablet Commonly known as:  ALDACTONE Take 1 tablet (25 mg total) by mouth daily.   SYNTHROID 50 MCG tablet Generic drug:  levothyroxine Take 50 mcg by mouth daily.   warfarin 2.5 MG tablet Commonly known as:  COUMADIN Take as directed. If you are unsure how to take this medication, talk to your nurse or doctor. Original instructions:  TAKE 1 TABLETS BY MOUTH DAILY OR AS DIRECTED BY COUMADIN CLINIC What changed:  See the new instructions.       Outstanding Labs/Studies   None  Duration of Discharge Encounter   Greater  than 30 minutes including physician time.  Signed, Kathyrn Drown NP 05/07/2017, 11:47 AM  Personally seen and examined. Agree with above.  76 year old female with generator change out with mechanical valve mitral prosthesis.  Prolonged hospitalization secondary to heparin bridging while Coumadin was being administered.  Her INR did not increase as quickly as expected and she did require higher doses of Coumadin then previously prescribed.  Her INR today has increased once again and is now above the 2 range.  She is adamant about going home.  I have called her primary cardiologist, Dr. Lorenza Cambridge, and he is comfortable sending her out currently.  She will have close follow-up.  She still has staples in place and will need removal.  Candee Furbish, MD

## 2017-05-15 ENCOUNTER — Ambulatory Visit (INDEPENDENT_AMBULATORY_CARE_PROVIDER_SITE_OTHER): Payer: Medicare Other | Admitting: Pharmacist Clinician (PhC)/ Clinical Pharmacy Specialist

## 2017-05-15 DIAGNOSIS — Z952 Presence of prosthetic heart valve: Secondary | ICD-10-CM | POA: Diagnosis not present

## 2017-05-15 DIAGNOSIS — Z7901 Long term (current) use of anticoagulants: Secondary | ICD-10-CM

## 2017-05-15 LAB — POCT INR: INR: 1.3

## 2017-05-18 ENCOUNTER — Ambulatory Visit (INDEPENDENT_AMBULATORY_CARE_PROVIDER_SITE_OTHER): Payer: Medicare Other | Admitting: Pharmacist Clinician (PhC)/ Clinical Pharmacy Specialist

## 2017-05-18 DIAGNOSIS — Z952 Presence of prosthetic heart valve: Secondary | ICD-10-CM | POA: Diagnosis not present

## 2017-05-18 DIAGNOSIS — Z7901 Long term (current) use of anticoagulants: Secondary | ICD-10-CM

## 2017-05-18 LAB — POCT INR: INR: 2.6

## 2017-05-18 NOTE — Patient Instructions (Signed)
Description   Increase dose to 1.5 tablets daily, repeat INR Friday

## 2017-05-20 ENCOUNTER — Ambulatory Visit (INDEPENDENT_AMBULATORY_CARE_PROVIDER_SITE_OTHER): Payer: Medicare Other | Admitting: *Deleted

## 2017-05-20 DIAGNOSIS — I5042 Chronic combined systolic (congestive) and diastolic (congestive) heart failure: Secondary | ICD-10-CM

## 2017-05-20 LAB — CUP PACEART INCLINIC DEVICE CHECK
Battery Remaining Longevity: 114 mo
Brady Statistic AP VP Percent: 27.85 %
Brady Statistic AP VS Percent: 2.32 %
Brady Statistic AS VP Percent: 66.58 %
Brady Statistic AS VS Percent: 3.25 %
Date Time Interrogation Session: 20181219143142
HIGH POWER IMPEDANCE MEASURED VALUE: 69 Ohm
Implantable Lead Implant Date: 20080221
Implantable Lead Implant Date: 20080221
Implantable Lead Location: 753858
Implantable Lead Location: 753860
Implantable Lead Model: 5076
Implantable Pulse Generator Implant Date: 20181128
Lead Channel Impedance Value: 304 Ohm
Lead Channel Impedance Value: 437 Ohm
Lead Channel Impedance Value: 456 Ohm
Lead Channel Impedance Value: 456 Ohm
Lead Channel Pacing Threshold Amplitude: 1 V
Lead Channel Pacing Threshold Amplitude: 1 V
Lead Channel Pacing Threshold Pulse Width: 0.4 ms
Lead Channel Pacing Threshold Pulse Width: 0.4 ms
Lead Channel Sensing Intrinsic Amplitude: 2.25 mV
Lead Channel Sensing Intrinsic Amplitude: 24.125 mV
Lead Channel Setting Pacing Amplitude: 1.5 V
Lead Channel Setting Pacing Amplitude: 3.75 V
Lead Channel Setting Pacing Pulse Width: 0.4 ms
Lead Channel Setting Sensing Sensitivity: 0.3 mV
MDC IDC LEAD IMPLANT DT: 20080221
MDC IDC LEAD LOCATION: 753859
MDC IDC LEAD SERIAL: 134379
MDC IDC MSMT BATTERY VOLTAGE: 3.11 V
MDC IDC MSMT LEADCHNL LV IMPEDANCE VALUE: 779 Ohm
MDC IDC MSMT LEADCHNL RA IMPEDANCE VALUE: 380 Ohm
MDC IDC MSMT LEADCHNL RV PACING THRESHOLD AMPLITUDE: 1.5 V
MDC IDC MSMT LEADCHNL RV PACING THRESHOLD PULSEWIDTH: 0.4 ms
MDC IDC SET LEADCHNL LV PACING AMPLITUDE: 1.5 V
MDC IDC SET LEADCHNL LV PACING PULSEWIDTH: 0.4 ms
MDC IDC STAT BRADY RA PERCENT PACED: 28.61 %
MDC IDC STAT BRADY RV PERCENT PACED: 4.11 %

## 2017-05-20 NOTE — Patient Instructions (Signed)
Please Call [facility] with [facility] Monitor Serial number  [id number]

## 2017-05-20 NOTE — Progress Notes (Signed)
Wound check appointment s/p gen change. Staples removed. Wound without redness or edema. Incision edges approximated, wound well healed. Normal device function. Thresholds, sensing, and impedances consistent with implant measurements. Device programmed at chronic outputs s/p gen change. Histogram distribution appropriate for patient and level of activity. No mode switches or ventricular arrhythmias noted. Patient educated about wound care, arm mobility, lifting restrictions, shock plan. ROV with [facility] 08/19/17.

## 2017-05-22 ENCOUNTER — Ambulatory Visit (INDEPENDENT_AMBULATORY_CARE_PROVIDER_SITE_OTHER): Payer: Medicare Other | Admitting: Pharmacist Clinician (PhC)/ Clinical Pharmacy Specialist

## 2017-05-22 DIAGNOSIS — Z952 Presence of prosthetic heart valve: Secondary | ICD-10-CM

## 2017-05-22 DIAGNOSIS — Z7901 Long term (current) use of anticoagulants: Secondary | ICD-10-CM

## 2017-05-22 LAB — POCT INR: INR: 2.6

## 2017-05-22 NOTE — Patient Instructions (Signed)
Description   Increase dose to 1.5 tablets daily except 2 tablets each Monday, Wednesday and Friday, repeat INR in 1 week

## 2017-05-25 DIAGNOSIS — E039 Hypothyroidism, unspecified: Secondary | ICD-10-CM | POA: Insufficient documentation

## 2017-05-25 DIAGNOSIS — Z952 Presence of prosthetic heart valve: Secondary | ICD-10-CM | POA: Insufficient documentation

## 2017-05-25 DIAGNOSIS — J449 Chronic obstructive pulmonary disease, unspecified: Secondary | ICD-10-CM | POA: Insufficient documentation

## 2017-05-25 DIAGNOSIS — N189 Chronic kidney disease, unspecified: Secondary | ICD-10-CM | POA: Insufficient documentation

## 2017-05-25 DIAGNOSIS — K279 Peptic ulcer, site unspecified, unspecified as acute or chronic, without hemorrhage or perforation: Secondary | ICD-10-CM | POA: Insufficient documentation

## 2017-05-25 DIAGNOSIS — I509 Heart failure, unspecified: Secondary | ICD-10-CM | POA: Insufficient documentation

## 2017-05-25 DIAGNOSIS — K649 Unspecified hemorrhoids: Secondary | ICD-10-CM | POA: Insufficient documentation

## 2017-05-25 DIAGNOSIS — K635 Polyp of colon: Secondary | ICD-10-CM | POA: Insufficient documentation

## 2017-05-25 DIAGNOSIS — M797 Fibromyalgia: Secondary | ICD-10-CM | POA: Insufficient documentation

## 2017-05-25 DIAGNOSIS — I10 Essential (primary) hypertension: Secondary | ICD-10-CM | POA: Insufficient documentation

## 2017-05-25 DIAGNOSIS — I341 Nonrheumatic mitral (valve) prolapse: Secondary | ICD-10-CM | POA: Insufficient documentation

## 2017-05-25 DIAGNOSIS — T8859XA Other complications of anesthesia, initial encounter: Secondary | ICD-10-CM | POA: Insufficient documentation

## 2017-05-25 DIAGNOSIS — E782 Mixed hyperlipidemia: Secondary | ICD-10-CM | POA: Insufficient documentation

## 2017-05-25 DIAGNOSIS — J45909 Unspecified asthma, uncomplicated: Secondary | ICD-10-CM | POA: Insufficient documentation

## 2017-05-25 DIAGNOSIS — H269 Unspecified cataract: Secondary | ICD-10-CM | POA: Insufficient documentation

## 2017-05-25 DIAGNOSIS — K5790 Diverticulosis of intestine, part unspecified, without perforation or abscess without bleeding: Secondary | ICD-10-CM | POA: Insufficient documentation

## 2017-05-25 DIAGNOSIS — F329 Major depressive disorder, single episode, unspecified: Secondary | ICD-10-CM | POA: Insufficient documentation

## 2017-05-25 DIAGNOSIS — M109 Gout, unspecified: Secondary | ICD-10-CM | POA: Insufficient documentation

## 2017-05-25 DIAGNOSIS — K219 Gastro-esophageal reflux disease without esophagitis: Secondary | ICD-10-CM | POA: Insufficient documentation

## 2017-05-25 DIAGNOSIS — R011 Cardiac murmur, unspecified: Secondary | ICD-10-CM | POA: Insufficient documentation

## 2017-05-25 DIAGNOSIS — F419 Anxiety disorder, unspecified: Secondary | ICD-10-CM | POA: Insufficient documentation

## 2017-05-25 DIAGNOSIS — K589 Irritable bowel syndrome without diarrhea: Secondary | ICD-10-CM | POA: Insufficient documentation

## 2017-05-25 DIAGNOSIS — T4145XA Adverse effect of unspecified anesthetic, initial encounter: Secondary | ICD-10-CM | POA: Insufficient documentation

## 2017-05-25 DIAGNOSIS — F32A Depression, unspecified: Secondary | ICD-10-CM | POA: Insufficient documentation

## 2017-05-25 DIAGNOSIS — I499 Cardiac arrhythmia, unspecified: Secondary | ICD-10-CM | POA: Insufficient documentation

## 2017-05-25 DIAGNOSIS — G709 Myoneural disorder, unspecified: Secondary | ICD-10-CM | POA: Insufficient documentation

## 2017-05-25 DIAGNOSIS — M719 Bursopathy, unspecified: Secondary | ICD-10-CM | POA: Insufficient documentation

## 2017-05-25 DIAGNOSIS — M1990 Unspecified osteoarthritis, unspecified site: Secondary | ICD-10-CM | POA: Insufficient documentation

## 2017-05-25 DIAGNOSIS — D649 Anemia, unspecified: Secondary | ICD-10-CM | POA: Insufficient documentation

## 2017-05-25 DIAGNOSIS — Z950 Presence of cardiac pacemaker: Secondary | ICD-10-CM | POA: Insufficient documentation

## 2017-05-25 DIAGNOSIS — T7840XA Allergy, unspecified, initial encounter: Secondary | ICD-10-CM | POA: Insufficient documentation

## 2017-05-29 ENCOUNTER — Ambulatory Visit (INDEPENDENT_AMBULATORY_CARE_PROVIDER_SITE_OTHER): Payer: Medicare Other | Admitting: Pharmacist Clinician (PhC)/ Clinical Pharmacy Specialist

## 2017-05-29 DIAGNOSIS — Z7901 Long term (current) use of anticoagulants: Secondary | ICD-10-CM

## 2017-05-29 DIAGNOSIS — Z952 Presence of prosthetic heart valve: Secondary | ICD-10-CM

## 2017-05-29 LAB — POCT INR: INR: 3.6

## 2017-06-05 ENCOUNTER — Ambulatory Visit (INDEPENDENT_AMBULATORY_CARE_PROVIDER_SITE_OTHER): Payer: Medicare Other | Admitting: Pharmacist Clinician (PhC)/ Clinical Pharmacy Specialist

## 2017-06-05 DIAGNOSIS — Z952 Presence of prosthetic heart valve: Secondary | ICD-10-CM

## 2017-06-05 DIAGNOSIS — Z7901 Long term (current) use of anticoagulants: Secondary | ICD-10-CM | POA: Diagnosis not present

## 2017-06-05 LAB — POCT INR: INR: 2.9

## 2017-06-05 NOTE — Patient Instructions (Signed)
Description   Continue with 1.5 tablets daily except 2 tablets each Monday, Wednesday and Friday, repeat INR in 2 week

## 2017-06-18 ENCOUNTER — Other Ambulatory Visit: Payer: Self-pay | Admitting: Cardiovascular Disease

## 2017-06-19 ENCOUNTER — Ambulatory Visit (INDEPENDENT_AMBULATORY_CARE_PROVIDER_SITE_OTHER): Payer: Medicare Other | Admitting: Pharmacist Clinician (PhC)/ Clinical Pharmacy Specialist

## 2017-06-19 DIAGNOSIS — Z7901 Long term (current) use of anticoagulants: Secondary | ICD-10-CM | POA: Diagnosis not present

## 2017-06-19 DIAGNOSIS — Z952 Presence of prosthetic heart valve: Secondary | ICD-10-CM

## 2017-06-19 LAB — POCT INR: INR: 4.6

## 2017-06-19 NOTE — Patient Instructions (Signed)
Description   Hold today January 18th. Continue with 1.5 tablets daily except 2 tablets each Monday and Friday. Repeat INR in 2 week.

## 2017-07-03 ENCOUNTER — Ambulatory Visit (INDEPENDENT_AMBULATORY_CARE_PROVIDER_SITE_OTHER): Payer: Medicare Other | Admitting: Pharmacist Clinician (PhC)/ Clinical Pharmacy Specialist

## 2017-07-03 DIAGNOSIS — Z7901 Long term (current) use of anticoagulants: Secondary | ICD-10-CM

## 2017-07-03 DIAGNOSIS — Z952 Presence of prosthetic heart valve: Secondary | ICD-10-CM

## 2017-07-03 LAB — POCT INR: INR: 4.1

## 2017-07-21 ENCOUNTER — Other Ambulatory Visit: Payer: Self-pay | Admitting: Cardiovascular Disease

## 2017-07-22 ENCOUNTER — Ambulatory Visit: Payer: Medicare Other | Admitting: Cardiovascular Disease

## 2017-07-22 ENCOUNTER — Encounter: Payer: Self-pay | Admitting: Cardiovascular Disease

## 2017-07-22 ENCOUNTER — Ambulatory Visit (INDEPENDENT_AMBULATORY_CARE_PROVIDER_SITE_OTHER): Payer: Medicare Other | Admitting: Pharmacist Clinician (PhC)/ Clinical Pharmacy Specialist

## 2017-07-22 VITALS — BP 122/66 | HR 69 | Ht 62.0 in | Wt 113.2 lb

## 2017-07-22 DIAGNOSIS — I341 Nonrheumatic mitral (valve) prolapse: Secondary | ICD-10-CM

## 2017-07-22 DIAGNOSIS — I10 Essential (primary) hypertension: Secondary | ICD-10-CM | POA: Diagnosis not present

## 2017-07-22 DIAGNOSIS — Z952 Presence of prosthetic heart valve: Secondary | ICD-10-CM

## 2017-07-22 DIAGNOSIS — Z95810 Presence of automatic (implantable) cardiac defibrillator: Secondary | ICD-10-CM | POA: Diagnosis not present

## 2017-07-22 DIAGNOSIS — Z7901 Long term (current) use of anticoagulants: Secondary | ICD-10-CM | POA: Diagnosis not present

## 2017-07-22 LAB — POCT INR: INR: 2.5

## 2017-07-22 NOTE — Assessment & Plan Note (Signed)
History of IV ICD implantation with CRT which resulted in marked improvement in her ejection fraction from 10% up to near normal. She recently had a generator change by Dr. Sallyanne Kuster on 04/28/17 after a Lovenox bridge for her mitral valve

## 2017-07-22 NOTE — Progress Notes (Signed)
07/22/2017 [name]   [birth year]  [id number]  Primary Physician Leanna Battles, MD Primary Cardiologist: Lorretta Harp MD FACP, [place], [place], [place]  HPI:  [name] is a 76 y.o.  thin appearing, married, Caucasian female, mother of three (one deceased child), grandmother to eight grandchildren, who I last saw in the office 07/09/16. Her husband is also a patient of mine. He had stage IV lung cancer, which responded to chemotherapy. 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 Berle Mull in 2002 with redo with a St. [name] MVR performed by Dr. Gilford Raid in 2007. She had normal coronary arteries. Her EF improved initially from 10% to normal with cardiac resynchronization therapy. Dr. Sallyanne Kuster follows this. She has undergone a generator change by Dr. Sallyanne Kuster on April 22, 2011, and she is followed by [employer].  Recent 2-D echocardiogram performed 02/07/16 revealed normal ejection fraction with a normal functioning mitral mechanical prosthesis. She denies chest pain or shortness of breath.  She had a bi-V generator change by Dr. Sallyanne Kuster 04/28/17 after having a Lovenox bridge for her mitral valve. She is otherwise asymptomatic with her most recent 2-D echo performed 07/31/16 revealing an EF of 45-50% with a well-functioning mitral mechanical prosthesis.     No outpatient medications have been marked as taking for the 07/22/17 encounter (Office Visit) with Lorretta Harp, MD.   Current Facility-Administered Medications for the 07/22/17 encounter (Office Visit) with Lorretta Harp, MD  Medication  . 0.9 %  sodium chloride infusion     Allergies  Allergen Reactions  . Ace Inhibitors Cough  . Lipitor [Atorvastatin Calcium] Other (See Comments)    Knee pain.  . Codeine Nausea And Vomiting    Social History   Socioeconomic History  . Marital status: Married    Spouse name: [name]  . Number of children: 4  . Years  of education: 19  . Highest education level: Not on file  Social Needs  . Financial resource strain: Not on file  . Food insecurity - worry: Not on file  . Food insecurity - inability: Not on file  . Transportation needs - medical: Not on file  . Transportation needs - non-medical: Not on file  Occupational History  . Occupation: retired  Tobacco Use  . Smoking status: Former Smoker    Types: Cigarettes    Last attempt to quit: 06/02/2001    Years since quitting: 16.1  . Smokeless tobacco: Never Used  Substance and Sexual Activity  . Alcohol use: Yes    Alcohol/week: 0.0 oz    Comment: social-1 every 6 months  . Drug use: No  . Sexual activity: Not on file  Other Topics Concern  . Not on file  Social History Narrative   Drinks very little caffeine      Review of Systems: General: negative for chills, fever, night sweats or weight changes.  Cardiovascular: negative for chest pain, dyspnea on exertion, edema, orthopnea, palpitations, paroxysmal nocturnal dyspnea or shortness of breath Dermatological: negative for rash Respiratory: negative for cough or wheezing Urologic: negative for hematuria Abdominal: negative for nausea, vomiting, diarrhea, bright red blood per rectum, melena, or hematemesis Neurologic: negative for visual changes, syncope, or dizziness All other systems reviewed and are otherwise negative except as noted above.    Blood pressure 122/66, pulse 69, height 5' 2"  (1.575 m), weight 113 lb 3.2 oz (51.3 kg).  General appearance: alert and no distress  Neck: no adenopathy, no carotid bruit, no JVD, supple, symmetrical, trachea midline and thyroid not enlarged, symmetric, no tenderness/mass/nodules Lungs: clear to auscultation bilaterally Heart: regular rate and rhythm, S1, S2 normal, no murmur, click, rub or gallop and Chris mitral valve prosthetic sounds. Extremities: extremities normal, atraumatic, no cyanosis or edema Pulses: 2+ and symmetric Skin: Skin  color, texture, turgor normal. No rashes or lesions Neurologic: Alert and oriented X 3, normal strength and tone. Normal symmetric reflexes. Normal coordination and gait  EKG AV sequential pacing. I personally reviewed this EKG  ASSESSMENT AND PLAN:   HYPERLIPIDEMIA History of hyperlipidemia on statin therapy followed by her PCP  History of mitral valve replacement with mechanical valve History of St. [name] 25 mm aortic valve implantation in 2007 with initial repair in 2002. Recent 2-D echo performed 07/31/16 revealed a well-functioning mitral mechanical prosthesis with an ejection fraction of 45-50%.  Biventricular ICD (implantable cardioverter-defibrillator) in place History of IV ICD implantation with CRT which resulted in marked improvement in her ejection fraction from 10% up to near normal. She recently had a generator change by Dr. Sallyanne Kuster on 04/28/17 after a Lovenox bridge for her mitral valve  Hypertension History of essential hypertension or blood pressure measures 122/66. She is on metoprolol and Aldactone. Continue current meds at current dosing.      Lorretta Harp MD FACP,FACC,FAHA, [facility] 07/22/2017 10:39 AM

## 2017-07-22 NOTE — Assessment & Plan Note (Signed)
History of essential hypertension or blood pressure measures 122/66. She is on metoprolol and Aldactone. Continue current meds at current dosing.

## 2017-07-22 NOTE — Assessment & Plan Note (Signed)
History of hyperlipidemia on statin therapy followed by her PCP. 

## 2017-07-22 NOTE — Patient Instructions (Signed)
Medication Instructions: Your physician recommends that you continue on your current medications as directed. Please refer to the Current Medication list given to you today.  Testing/Procedures: Your physician has requested that you have an echocardiogram--now and in 1 year prior to follow-up visit. Echocardiography is a painless test that uses sound waves to create images of your heart. It provides your doctor with information about the size and shape of your heart and how well your heart's chambers and valves are working. This procedure takes approximately one hour. There are no restrictions for this procedure.   Follow-Up: Your physician wants you to follow-up in: 1 year with Dr. Gwenlyn Found. You will receive a reminder letter in the mail two months in advance. If you don't receive a letter, please call our office to schedule the follow-up appointment.  If you need a refill on your cardiac medications before your next appointment, please call your pharmacy.

## 2017-07-22 NOTE — Assessment & Plan Note (Signed)
History of St. [name] 25 mm aortic valve implantation in 2007 with initial repair in 2002. Recent 2-D echo performed 07/31/16 revealed a well-functioning mitral mechanical prosthesis with an ejection fraction of 45-50%.

## 2017-08-03 ENCOUNTER — Ambulatory Visit (INDEPENDENT_AMBULATORY_CARE_PROVIDER_SITE_OTHER): Payer: Medicare Other | Admitting: Pharmacist

## 2017-08-03 DIAGNOSIS — Z952 Presence of prosthetic heart valve: Secondary | ICD-10-CM | POA: Diagnosis not present

## 2017-08-03 DIAGNOSIS — Z7901 Long term (current) use of anticoagulants: Secondary | ICD-10-CM | POA: Diagnosis not present

## 2017-08-03 LAB — POCT INR: INR: 2.3

## 2017-08-03 NOTE — Patient Instructions (Signed)
Description   Take 2.5 tablets today then Continue with 1.5 tablets daily except 2 tablets each Monday.  Repeat INR in 4 weeks

## 2017-08-05 ENCOUNTER — Other Ambulatory Visit: Payer: Self-pay

## 2017-08-05 ENCOUNTER — Ambulatory Visit (HOSPITAL_COMMUNITY): Payer: Medicare Other | Attending: Cardiology

## 2017-08-05 DIAGNOSIS — I341 Nonrheumatic mitral (valve) prolapse: Secondary | ICD-10-CM | POA: Diagnosis not present

## 2017-08-05 DIAGNOSIS — Z952 Presence of prosthetic heart valve: Secondary | ICD-10-CM | POA: Diagnosis not present

## 2017-08-05 DIAGNOSIS — I061 Rheumatic aortic insufficiency: Secondary | ICD-10-CM | POA: Diagnosis not present

## 2017-08-05 DIAGNOSIS — I5030 Unspecified diastolic (congestive) heart failure: Secondary | ICD-10-CM | POA: Insufficient documentation

## 2017-08-19 ENCOUNTER — Encounter: Payer: Medicare Other | Admitting: Cardiovascular Disease

## 2017-08-31 ENCOUNTER — Ambulatory Visit (INDEPENDENT_AMBULATORY_CARE_PROVIDER_SITE_OTHER): Payer: Medicare Other | Admitting: Pharmacist

## 2017-08-31 DIAGNOSIS — Z952 Presence of prosthetic heart valve: Secondary | ICD-10-CM | POA: Diagnosis not present

## 2017-08-31 DIAGNOSIS — Z7901 Long term (current) use of anticoagulants: Secondary | ICD-10-CM | POA: Diagnosis not present

## 2017-08-31 LAB — POCT INR: INR: 4.2

## 2017-09-13 ENCOUNTER — Other Ambulatory Visit: Payer: Self-pay | Admitting: Cardiovascular Disease

## 2017-09-14 ENCOUNTER — Ambulatory Visit: Payer: Medicare Other | Admitting: Podiatry

## 2017-09-14 ENCOUNTER — Encounter: Payer: Self-pay | Admitting: Cardiovascular Disease

## 2017-09-14 ENCOUNTER — Other Ambulatory Visit: Payer: Self-pay | Admitting: Pharmacist Clinician (PhC)/ Clinical Pharmacy Specialist

## 2017-09-14 ENCOUNTER — Encounter: Payer: Self-pay | Admitting: *Deleted

## 2017-09-14 ENCOUNTER — Ambulatory Visit (INDEPENDENT_AMBULATORY_CARE_PROVIDER_SITE_OTHER): Payer: Medicare Other | Admitting: Pharmacist Clinician (PhC)/ Clinical Pharmacy Specialist

## 2017-09-14 ENCOUNTER — Encounter: Payer: Self-pay | Admitting: Podiatry

## 2017-09-14 ENCOUNTER — Ambulatory Visit (INDEPENDENT_AMBULATORY_CARE_PROVIDER_SITE_OTHER): Payer: Medicare Other

## 2017-09-14 DIAGNOSIS — M19072 Primary osteoarthritis, left ankle and foot: Secondary | ICD-10-CM

## 2017-09-14 DIAGNOSIS — M779 Enthesopathy, unspecified: Secondary | ICD-10-CM | POA: Diagnosis not present

## 2017-09-14 DIAGNOSIS — M19071 Primary osteoarthritis, right ankle and foot: Secondary | ICD-10-CM | POA: Diagnosis not present

## 2017-09-14 DIAGNOSIS — Z952 Presence of prosthetic heart valve: Secondary | ICD-10-CM

## 2017-09-14 DIAGNOSIS — Z7901 Long term (current) use of anticoagulants: Secondary | ICD-10-CM

## 2017-09-14 LAB — POCT INR: INR: 5.3

## 2017-09-14 NOTE — Patient Instructions (Signed)
Description   No warfarin Monday April 15 or Tuesday April 16, then decrease dose to 1.5 tablets daily except 1 tablet each Monday and Friday.  Repeat INR in 2 weeks

## 2017-09-14 NOTE — Progress Notes (Signed)
g foot

## 2017-09-16 NOTE — Progress Notes (Signed)
Subjective:   Patient ID: [name], female   DOB: 77 y.o.   MRN: [id number]   HPI 77 year old female presents the office today for concerns of pain to the top of her feet on both sides which is been ongoing for some time.  She says it hurts if she walks fast.  She denies any recent injury or trauma to the area she said no recent treatment.  Some occasional mild swelling.  She does have a history of gout.  She has no other concerns today.   Review of Systems  All other systems reviewed and are negative.  Past Medical History:  Diagnosis Date  . Allergy    seasonal  . Anemia   . Anxiety   . Arthritis   . Asthma   . 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 Protecta  . Biventricular ICD (implantable cardioverter-defibrillator) in place    with CRT  . Blood transfusion without reported diagnosis   . Bursitis   . Cataract    RIGHT EYE  . CHF (congestive heart failure) ([place])   . Chronic kidney disease   . Colon polyp    adenomatous  . Complication of anesthesia    patient stated that had difficulty getting the breathing tube removed, patient said that she stopped breathing and HR dropped to 10  patient then woke up and started breathing pateint stated no longer than one minute; re-intubated in PACU following cholecystectomy 10/28/13  . COPD (chronic obstructive pulmonary disease) ([place])   . Depression   . Diverticulosis   . Dysrhythmia   . Fibromyalgia   . GERD (gastroesophageal reflux disease)   . Gout   . H/O mitral valve replacement 2002, 2007  . Heart murmur   . Hemorrhoids   . Hyperlipidemia   . Hypertension   . Hypothyroidism   . IBS (irritable bowel syndrome)   . MVP (mitral valve prolapse)   . Neuromuscular disorder (HCC)    fibromyalgia  . Pacemaker   . Peptic ulcer disease     Past Surgical History:  Procedure Laterality Date  . ABDOMINAL HYSTERECTOMY    . BIV ICD GENERATOR CHANGEOUT N/A 04/29/2017   Procedure: BIV ICD  GENERATOR CHANGEOUT;  Surgeon: Sanda Klein, MD;  Location: [facility];  Service: Cardiovascular;  Laterality: N/A;  . CARDIAC CATHETERIZATION  02/25/2006   normal left main, normal LAD, normal L Cfx, normal/dominant RCA (Dr. [name])  . CARDIAC DEFIBRILLATOR PLACEMENT  2007, 11/202012   x2 (pacemaker) (Dr. Jerilynn Mages. [place])  . CARDIAC VALVE REPLACEMENT  2002   MV repair - Dr. Berle Mull  . CHOLECYSTECTOMY N/A 10/28/2013   Procedure: LAPAROSCOPIC CHOLECYSTECTOMY WITH INTRAOPERATIVE CHOLANGIOGRAM;  Surgeon: Edward Jolly, MD;  Location: [facility];  Service: General;  Laterality: N/A;  . COLONOSCOPY    . EYE SURGERY    . IMPLANTABLE CARDIOVERTER DEFIBRILLATOR (ICD) GENERATOR CHANGE N/A 04/10/2011   Procedure: ICD GENERATOR CHANGE;  Surgeon: Sanda Klein, MD;  Location: [facility];  Service: Cardiovascular;  Laterality: N/A;  . KNEE ARTHROSCOPY    . MITRAL VALVE REPLACEMENT  02/26/2006   re-do MVR w/68m St. [name] (Dr. BEllison Hughs  . NM MYOCAR PERF WALL MOTION  2005   persantine myoview - low ris, EF 63%  . RIGHT HEART CATH  04/03/2006   pulm cap wedge pressure 24/24, PA pressure 43/22 (mean 364mg), CO 4.8, CI 4.1 (Dr. J.Jackie Plum . TOTAL SHOULDER ARTHROPLASTY Right 12/27/2015   Procedure: RIGHT  TOTAL SHOULDER ARTHROPLASTY;  Surgeon: Tania Ade, MD;  Location: [facility];  Service: Orthopedics;  Laterality: Right;  Right total shoulder arthroplasty  . TRANSTHORACIC ECHOCARDIOGRAM  12/2011   EF 50-55%, mild global hypokinesis; LA severely dilated; calcification of anterior/posterior MV leaflets, bi-leaflets St. [name] mechanical MV; mild TR; trace AV regurg/pulm valve regurg     Current Outpatient Medications:  .  allopurinol (ZYLOPRIM) 300 MG tablet, Take 300 mg by mouth every evening. , Disp: , Rfl:  .  ALPRAZolam (XANAX) 0.25 MG tablet, Take 0.25 mg by mouth 3 (three) times daily as needed for anxiety., Disp: , Rfl:  .  amoxicillin (AMOXIL) 500 MG capsule, TAKE 4 CAPSULES BY MOUTH 1  HOUR PRIOR TO DENTAL APPOINTMENT, Disp: 12 capsule, Rfl: 0 .  buPROPion (WELLBUTRIN) 100 MG tablet, Take 100 mg by mouth daily., Disp: , Rfl: 12 .  feeding supplement, ENSURE ENLIVE, (ENSURE ENLIVE) LIQD, Take 237 mLs by mouth 2 (two) times daily between meals., Disp: 237 mL, Rfl: 12 .  furosemide (LASIX) 40 MG tablet, Take 1 tablet (40 mg total) by mouth daily. Take extra 40 mg daily prn for weight gain 3lbs/day or 5 lbs/week or if you wake up with edema, Disp: 90 tablet, Rfl: 9 .  hydrOXYzine (VISTARIL) 25 MG capsule, Take 25 mg by mouth at bedtime., Disp: , Rfl:  .  ibuprofen (ADVIL,MOTRIN) 200 MG tablet, Take 200 mg daily as needed by mouth for moderate pain., Disp: , Rfl:  .  metoprolol succinate (TOPROL-XL) 25 MG 24 hr tablet, Take 1 tablet (25 mg total) by mouth daily., Disp: 30 tablet, Rfl: 11 .  Multiple Vitamins-Minerals (MULTIVITAMINS THER. W/MINERALS) TABS, Take 1 tablet by mouth every morning. , Disp: , Rfl:  .  pravastatin (PRAVACHOL) 40 MG tablet, Take 40 mg at bedtime by mouth. , Disp: , Rfl:  .  PROAIR HFA 108 (90 BASE) MCG/ACT inhaler, Inhale 1-2 puffs into the lungs every 6 (six) hours as needed for wheezing. , Disp: , Rfl:  .  ranitidine (ZANTAC) 150 MG tablet, Take 150 mg daily by mouth., Disp: , Rfl:  .  Sennosides (SENNA) 15 MG TABS, Take 1 tablet at bedtime by mouth. , Disp: , Rfl:  .  spironolactone (ALDACTONE) 25 MG tablet, Take 1 tablet (25 mg total) by mouth daily., Disp: 90 tablet, Rfl: 3 .  SYNTHROID 50 MCG tablet, Take 50 mcg by mouth daily., Disp: , Rfl: 2 .  warfarin (COUMADIN) 2.5 MG tablet, TAKE 1 TABLETS BY MOUTH DAILY OR AS DIRECTED BY COUMADIN CLINIC, Disp: 90 tablet, Rfl: 1  Current Facility-Administered Medications:  .  0.9 %  sodium chloride infusion, 500 mL, Intravenous, Continuous, Irene Shipper, MD  Allergies  Allergen Reactions  . Ace Inhibitors Cough  . Lipitor [Atorvastatin Calcium] Other (See Comments)    Knee pain.  . Codeine Nausea And  Vomiting    Social History   Socioeconomic History  . Marital status: Married    Spouse name: [name]  . Number of children: 4  . Years of education: 59  . Highest education level: Not on file  Occupational History  . Occupation: retired  Scientific laboratory technician  . Financial resource strain: Not on file  . Food insecurity:    Worry: Not on file    Inability: Not on file  . Transportation needs:    Medical: Not on file    Non-medical: Not on file  Tobacco Use  . Smoking status: Former Smoker    Types:  Cigarettes    Last attempt to quit: 06/02/2001    Years since quitting: 16.3  . Smokeless tobacco: Never Used  Substance and Sexual Activity  . Alcohol use: Yes    Alcohol/week: 0.0 oz    Comment: social-1 every 6 months  . Drug use: No  . Sexual activity: Not on file  Lifestyle  . Physical activity:    Days per week: Not on file    Minutes per session: Not on file  . Stress: Not on file  Relationships  . Social connections:    Talks on phone: Not on file    Gets together: Not on file    Attends religious service: Not on file    Active member of club or organization: Not on file    Attends meetings of clubs or organizations: Not on file    Relationship status: Not on file  . Intimate partner violence:    Fear of current or ex partner: Not on file    Emotionally abused: Not on file    Physically abused: Not on file    Forced sexual activity: Not on file  Other Topics Concern  . Not on file  Social History Narrative   Drinks very little caffeine          Objective:  Physical Exam  General: AAO x3, NAD  Dermatological: Skin is warm, dry and supple bilateral. Nails x 10 are well manicured; remaining integument appears unremarkable at this time. There are no open sores, no preulcerative lesions, no rash or signs of infection present.  Vascular: Dorsalis Pedis artery and Posterior Tibial artery pedal pulses are 2/4 bilateral with immedate capillary fill time. There is no pain  with calf compression, swelling, warmth, erythema.   Neruologic: Grossly intact via light touch bilateral.Protective threshold with Semmes Wienstein monofilament intact to all pedal sites bilateral.   Musculoskeletal: There is tenderness palpation diffusely along the Lisfranc joint mostly on the first, second, third metatarsal cuneiform joints.  There is no area pinpoint bony tenderness.  Small dorsal exostosis palpable of the dorsal medial midfoot.  There is no overlying edema, erythema, increase in warmth.  No pain with ankle or other areas of the foot.  Muscular strength 5/5 in all groups tested bilateral.     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  Arthritic changes present of the Lisfranc joint bilaterally.  No evidence of acute fracture or stress fracture identified today. -We discussed the change in shoes as well as wearing a more supportive constipation.  She is wearing a shoe that is different she states this is more comfortable for her.  Discussed working on this on a more regular basis.  Unfortunately he continues to do anti-inflammatories.  Discussed steroid injection however is mildly painful today today for removal of injection but we can consider this in the future if needed.  Also discussed a custom orthotic.  Trula Slade DPM

## 2017-09-30 ENCOUNTER — Ambulatory Visit (INDEPENDENT_AMBULATORY_CARE_PROVIDER_SITE_OTHER): Payer: Medicare Other | Admitting: Pharmacist

## 2017-09-30 ENCOUNTER — Encounter: Payer: Medicare Other | Admitting: Cardiovascular Disease

## 2017-09-30 DIAGNOSIS — Z952 Presence of prosthetic heart valve: Secondary | ICD-10-CM | POA: Diagnosis not present

## 2017-09-30 DIAGNOSIS — Z7901 Long term (current) use of anticoagulants: Secondary | ICD-10-CM | POA: Diagnosis not present

## 2017-09-30 LAB — POCT INR: INR: 5.8

## 2017-10-14 ENCOUNTER — Ambulatory Visit (INDEPENDENT_AMBULATORY_CARE_PROVIDER_SITE_OTHER): Payer: Medicare Other | Admitting: Pharmacist Clinician (PhC)/ Clinical Pharmacy Specialist

## 2017-10-14 ENCOUNTER — Other Ambulatory Visit: Payer: Self-pay | Admitting: Pharmacist Clinician (PhC)/ Clinical Pharmacy Specialist

## 2017-10-14 DIAGNOSIS — Z7901 Long term (current) use of anticoagulants: Secondary | ICD-10-CM | POA: Diagnosis not present

## 2017-10-14 DIAGNOSIS — Z952 Presence of prosthetic heart valve: Secondary | ICD-10-CM | POA: Diagnosis not present

## 2017-10-14 LAB — POCT INR: INR: 3.8

## 2017-10-14 MED ORDER — AMOXICILLIN 500 MG PO CAPS: ORAL_CAPSULE | ORAL | 1 refills | Status: DC

## 2017-10-29 ENCOUNTER — Encounter

## 2017-10-29 ENCOUNTER — Ambulatory Visit (INDEPENDENT_AMBULATORY_CARE_PROVIDER_SITE_OTHER): Payer: Medicare Other | Admitting: Pharmacist Clinician (PhC)/ Clinical Pharmacy Specialist

## 2017-10-29 ENCOUNTER — Ambulatory Visit (INDEPENDENT_AMBULATORY_CARE_PROVIDER_SITE_OTHER): Payer: Medicare Other | Admitting: Cardiovascular Disease

## 2017-10-29 ENCOUNTER — Encounter: Payer: Self-pay | Admitting: Cardiovascular Disease

## 2017-10-29 VITALS — BP 122/64 | HR 67 | Ht 62.0 in | Wt 108.2 lb

## 2017-10-29 DIAGNOSIS — E43 Unspecified severe protein-calorie malnutrition: Secondary | ICD-10-CM

## 2017-10-29 DIAGNOSIS — I498 Other specified cardiac arrhythmias: Secondary | ICD-10-CM

## 2017-10-29 DIAGNOSIS — Z7901 Long term (current) use of anticoagulants: Secondary | ICD-10-CM

## 2017-10-29 DIAGNOSIS — Z952 Presence of prosthetic heart valve: Secondary | ICD-10-CM

## 2017-10-29 DIAGNOSIS — I5042 Chronic combined systolic (congestive) and diastolic (congestive) heart failure: Secondary | ICD-10-CM

## 2017-10-29 DIAGNOSIS — I480 Paroxysmal atrial fibrillation: Secondary | ICD-10-CM

## 2017-10-29 DIAGNOSIS — N183 Chronic kidney disease, stage 3 unspecified: Secondary | ICD-10-CM

## 2017-10-29 DIAGNOSIS — Z95810 Presence of automatic (implantable) cardiac defibrillator: Secondary | ICD-10-CM

## 2017-10-29 LAB — POCT INR: INR: 4.1 — AB (ref 2.0–3.0)

## 2017-10-29 MED ORDER — METOPROLOL SUCCINATE ER 25 MG PO TB24: 37.5000 mg | ORAL_TABLET | Freq: Every day | ORAL | 3 refills | Status: DC

## 2017-10-29 NOTE — Patient Instructions (Signed)
Description   Take only 1/2 tablet today Thursday May 30, then decrease dose to 1 tablets daily except 1.5 tablet each Tuesday and Saturday. Repeat INR in 2 weeks

## 2017-10-29 NOTE — Patient Instructions (Signed)
Dr Sallyanne Kuster has recommended making the following medication changes: 1. INCREASE Metoprolol to 37.5 mg (1.5 tablets) daily  Your physician recommends that you schedule a follow-up appointment in 3 months with an ICD check.  If you need a refill on your cardiac medications before your next appointment, please call your pharmacy.

## 2017-10-29 NOTE — Progress Notes (Signed)
Cardiology Office Note    Date:  10/31/2017   ID:  [name], DOB 1941-03-09, MRN [id number]  PCP:  Leanna Battles, MD  Cardiologist:  Quay Burow, M.D.; Sanda Klein, MD   Chief Complaint  Patient presents with  CRT-D check    History of Present Illness:  [name] is a 77 y.o. female here for follow-up on her biventricular pacemaker/defibrillator  ([place], generator changeout in November 2018) and chronic heart failure  Device function is normal.  Estimated generator longevity is 9.4 years.  She has 16% atrial pacing and 92% biventricular pacing.  Effective biventricular pacing is only 89.8%.  There is ventricular sensed response pacing 5% of the time and it appears that a large percentage of this is due to an accelerated junctional rhythm at about 70 bpm.  This was her presenting rhythm today, and it seems to be jogging for control with sinus rhythm back-and-forth.  She has not had any ventricular tachycardia or atrial fibrillation.  She continues to be excessively thin with BMI 19, from problems with anorexia and vomiting.  She complains of fatigue.  Denies shortness of breath or angina.  She has not had syncope or dizziness.  She denies any bleeding problems and has not had any falls or injuries.  She is on chronic warfarin anticoagulation for mitral valve replacement with a mechanical prosthesis and paroxysmal atrial fibrillation.   Echo performed August 05, 2017 showed left ventricular ejection fraction of 50-55% and normal function of the mitral valve prosthesis, mean gradient 3 mm Hg.  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 Left ventricular ejection fraction which has been as low as 10% improved substantially after implantation of the CRT-D device and now her LVEF is near normal. She received her initial CRT-D in  2008 and underwent a generator change out in 2012 and 2019. She has not received VT therapy. She has a Ri[employer] 7 [place] ICD lead under advisory, which appeared fluoroscopically normal at the time of last generator change and which still has normal parameters. Did not have any angiographic CAD prior to her surgery.   Past Medical History:  Diagnosis Date  . Allergy    seasonal  . Anemia   . Anxiety   . Arthritis   . Asthma   . 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 Protecta  . Biventricular ICD (implantable cardioverter-defibrillator) in place    with CRT  . Blood transfusion without reported diagnosis   . Bursitis   . Cataract    RIGHT EYE  . CHF (congestive heart failure) ([place])   . Chronic kidney disease   . Colon polyp    adenomatous  . Complication of anesthesia    patient stated that had difficulty getting the breathing tube removed, patient said that she stopped breathing and HR dropped to 10  patient then woke up and started breathing pateint stated no longer than one minute; re-intubated in PACU following cholecystectomy 10/28/13  . COPD (chronic obstructive pulmonary disease) ([place])   . Depression   . Diverticulosis   . Dysrhythmia   . Fibromyalgia   . GERD (gastroesophageal reflux disease)   . Gout   . H/O mitral valve replacement 2002, 2007  . Heart murmur   . Hemorrhoids   . Hyperlipidemia   . Hypertension   . Hypothyroidism   . IBS (  irritable bowel syndrome)   . MVP (mitral valve prolapse)   . Neuromuscular disorder (HCC)    fibromyalgia  . Pacemaker   . Peptic ulcer disease     Past Surgical History:  Procedure Laterality Date  . ABDOMINAL HYSTERECTOMY    . BIV ICD GENERATOR CHANGEOUT N/A 04/29/2017   Procedure: BIV ICD GENERATOR CHANGEOUT;  Surgeon: Sanda Klein, MD;  Location: [facility];  Service: Cardiovascular;  Laterality: N/A;  . CARDIAC CATHETERIZATION  02/25/2006   normal left main, normal LAD, normal L  Cfx, normal/dominant RCA (Dr. [name])  . CARDIAC DEFIBRILLATOR PLACEMENT  2007, 11/202012   x2 (pacemaker) (Dr. Jerilynn Mages. [place] Sharples)  . CARDIAC VALVE REPLACEMENT  2002   MV repair - Dr. Berle Mull  . CHOLECYSTECTOMY N/A 10/28/2013   Procedure: LAPAROSCOPIC CHOLECYSTECTOMY WITH INTRAOPERATIVE CHOLANGIOGRAM;  Surgeon: Edward Jolly, MD;  Location: [facility];  Service: General;  Laterality: N/A;  . COLONOSCOPY    . EYE SURGERY    . IMPLANTABLE CARDIOVERTER DEFIBRILLATOR (ICD) GENERATOR CHANGE N/A 04/10/2011   Procedure: ICD GENERATOR CHANGE;  Surgeon: Sanda Klein, MD;  Location: [facility];  Service: Cardiovascular;  Laterality: N/A;  . KNEE ARTHROSCOPY    . MITRAL VALVE REPLACEMENT  02/26/2006   re-do MVR w/6m St. [name] (Dr. BEllison Hughs  . NM MYOCAR PERF WALL MOTION  2005   persantine myoview - low ris, EF 63%  . RIGHT HEART CATH  04/03/2006   pulm cap wedge pressure 24/24, PA pressure 43/22 (mean 339mg), CO 4.8, CI 4.1 (Dr. J.Jackie Plum . TOTAL SHOULDER ARTHROPLASTY Right 12/27/2015   Procedure: RIGHT TOTAL SHOULDER ARTHROPLASTY;  Surgeon: JuTania AdeMD;  Location: [facility] Service: Orthopedics;  Laterality: Right;  Right total shoulder arthroplasty  . TRANSTHORACIC ECHOCARDIOGRAM  12/2011   EF 50-55%, mild global hypokinesis; LA severely dilated; calcification of anterior/posterior MV leaflets, bi-leaflets St. [name] mechanical MV; mild TR; trace AV regurg/pulm valve regurg    Current Medications: Outpatient Medications Prior to Visit  Medication Sig Dispense Refill  . allopurinol (ZYLOPRIM) 300 MG tablet Take 300 mg by mouth every evening.     . Marland KitchenLPRAZolam (XANAX) 0.25 MG tablet Take 0.25 mg by mouth 3 (three) times daily as needed for anxiety.    . Marland Kitchenmoxicillin (AMOXIL) 500 MG capsule TAKE 4 CAPSULES BY MOUTH 1 HOUR PRIOR TO DENTAL APPOINTMENT 12 capsule 1  . buPROPion (WELLBUTRIN) 100 MG tablet Take 100 mg by mouth daily.  12  . feeding supplement, ENSURE ENLIVE, (ENSURE ENLIVE) LIQD  Take 237 mLs by mouth 2 (two) times daily between meals. 237 mL 12  . furosemide (LASIX) 40 MG tablet Take 1 tablet (40 mg total) by mouth daily. Take extra 40 mg daily prn for weight gain 3lbs/day or 5 lbs/week or if you wake up with edema 90 tablet 9  . ibuprofen (ADVIL,MOTRIN) 200 MG tablet Take 200 mg daily as needed by mouth for moderate pain.    . pravastatin (PRAVACHOL) 40 MG tablet Take 40 mg at bedtime by mouth.     . Marland KitchenROAIR HFA 108 (90 BASE) MCG/ACT inhaler Inhale 1-2 puffs into the lungs every 6 (six) hours as needed for wheezing.     . ranitidine (ZANTAC) 150 MG tablet Take 150 mg daily by mouth.    . Sennosides (SENNA) 15 MG TABS Take 1 tablet at bedtime by mouth.     . spironolactone (ALDACTONE) 25 MG tablet Take 1 tablet (25 mg total) by mouth daily.  90 tablet 3  . SYNTHROID 50 MCG tablet Take 50 mcg by mouth daily.  2  . warfarin (COUMADIN) 2.5 MG tablet TAKE 1 TABLETS BY MOUTH DAILY OR AS DIRECTED BY COUMADIN CLINIC 90 tablet 1  . hydrOXYzine (VISTARIL) 25 MG capsule Take 25 mg by mouth at bedtime.    . metoprolol succinate (TOPROL-XL) 25 MG 24 hr tablet Take 1 tablet (25 mg total) by mouth daily. 30 tablet 11  . Multiple Vitamins-Minerals (MULTIVITAMINS THER. W/MINERALS) TABS Take 1 tablet by mouth every morning.      Facility-Administered Medications Prior to Visit  Medication Dose Route Frequency Provider Last Rate Last Dose  . 0.9 %  sodium chloride infusion  500 mL Intravenous Continuous Irene Shipper, MD         Allergies:   Ace inhibitors; Lipitor [atorvastatin calcium]; and Codeine   Social History   Socioeconomic History  . Marital status: Married    Spouse name: [name]  . Number of children: 4  . Years of education: 53  . Highest education level: Not on file  Occupational History  . Occupation: retired  Scientific laboratory technician  . Financial resource strain: Not on file  . Food insecurity:    Worry: Not on file    Inability: Not on file  . Transportation needs:     Medical: Not on file    Non-medical: Not on file  Tobacco Use  . Smoking status: Former Smoker    Types: Cigarettes    Last attempt to quit: 06/02/2001    Years since quitting: 16.4  . Smokeless tobacco: Never Used  Substance and Sexual Activity  . Alcohol use: Yes    Alcohol/week: 0.0 oz    Comment: social-1 every 6 months  . Drug use: No  . Sexual activity: Not on file  Lifestyle  . Physical activity:    Days per week: Not on file    Minutes per session: Not on file  . Stress: Not on file  Relationships  . Social connections:    Talks on phone: Not on file    Gets together: Not on file    Attends religious service: Not on file    Active member of club or organization: Not on file    Attends meetings of clubs or organizations: Not on file    Relationship status: Not on file  Other Topics Concern  . Not on file  Social History Narrative   Drinks very little caffeine      Family History:  The patient's family history includes Breast cancer in her sister; Crohn's disease in her sister; Heart attack in her father and mother; Heart disease in her father, mother, sister, and son; Kidney disease in her father; Uterine cancer in her sister.   ROS:   Please see the history of present illness.    ROS All other systems reviewed and are negative.   PHYSICAL EXAM:   VS:  BP 122/64   Pulse 67   Ht 5' 2"  (1.575 m)   Wt 108 lb 3.2 oz (49.1 kg)   BMI 19.79 kg/m      General: Alert, oriented x3, no distress, Excessively lean Head: no evidence of trauma, PERRL, EOMI, no exophtalmos or lid lag, no myxedema, no xanthelasma; normal ears, nose and oropharynx Neck: normal jugular venous pulsations and no hepatojugular reflux; brisk carotid pulses without delay and no carotid bruits Chest: clear to auscultation, no signs of consolidation by percussion or palpation, normal fremitus, symmetrical and  full respiratory excursions.  There is a lot of fat atrophy overlying the left sided  subclavian pacemaker site, the skin itself appears healthy without evidence of adherence to the device or inflammation Cardiovascular: Crisp mechanical valve clicks, RRR; no murmurs, rubs, or gallops,no edema. Abdomen: no tenderness or distention, no masses by palpation, no abnormal pulsatility or arterial bruits, normal bowel sounds, no hepatosplenomegaly Extremities: no clubbing, cyanosis or edema; 2+ radial, ulnar and brachial pulses bilaterally; 2+ right femoral, posterior tibial and dorsalis pedis pulses; 2+ left femoral, posterior tibial and dorsalis pedis pulses; no subclavian or femoral bruits Neurological: grossly nonfocal Psych: Normal mood and affect   Wt Readings from Last 3 Encounters:  10/29/17 108 lb 3.2 oz (49.1 kg)  07/22/17 113 lb 3.2 oz (51.3 kg)  05/07/17 105 lb 9.6 oz (47.9 kg)      Studies/Labs Reviewed:   EKG:  EKG is ordered today.  Shows atrial paced, biventricular paced rhythm, QTC 518 ms  Recent Labs: 04/29/2017: ALT 14; BUN 15; Creatinine, Ser 1.34; Potassium 4.2; Sodium 136 05/07/2017: Hemoglobin 10.9; Platelets 270    ASSESSMENT:    1. Chronic combined systolic and diastolic heart failure ([place])   2. Accelerated junctional rhythm   3. Biventricular ICD (implantable cardioverter-defibrillator) in place   4. History of mitral valve replacement with mechanical valve   5. Paroxysmal atrial fibrillation (HCC)   6. Severe protein-calorie malnutrition ([place])   7. Long term (current) use of anticoagulants   8. CKD (chronic kidney disease) stage 3, GFR 30-59 ml/min (HCC)      PLAN:  In order of problems listed above:  1. CHF:  appears clinically euvolemic, asymptomatic from a cardiovascular standpoint.  Echo in March 2019 with EF 50-55%. 2. Acc junctional rhythm: Interfering with resynchronization pacing.  We will increase the lower rate limit to 70 bpm to see if we can override it and also increase patient's beta-blocker dose. 3. CRT-D: s/p 3rd generator  change in February 2019, site well healed. 4. S/P MVR mechanical prosthesis: Normal function of the prosthesis by her recent echo. 5. AFib: No recurrence since complete discontinuation of amiodarone over a year ago. 6. Warfarin: No bleeding complications.  INR slightly supratherapeutic today, which has been a recurrent problem over the last 6 months or so.  I suspect this is related to her nutritional status. 7. Malnutrition: weight up and down but she is remaining under nourished. 8. CKD 3: Creatinine clearance around 40  Medication Adjustments/Labs and Tests Ordered: Current medicines are reviewed at length with the patient today.  Concerns regarding medicines are outlined above.  Medication changes, Labs and Tests ordered today are listed in the Patient Instructions below. Patient Instructions  Dr Sallyanne Kuster has recommended making the following medication changes: 1. INCREASE Metoprolol to 37.5 mg (1.5 tablets) daily  Your physician recommends that you schedule a follow-up appointment in 3 months with an ICD check.  If you need a refill on your cardiac medications before your next appointment, please call your pharmacy.    Signed, Sanda Klein, MD  10/31/2017 3:59 PM    [facility] [place], [place], [place]  [address] Phone: [phone]; Fax: [phone]

## 2017-11-04 ENCOUNTER — Telehealth: Payer: Self-pay | Admitting: *Deleted

## 2017-11-04 NOTE — Telephone Encounter (Signed)
Called requesting office, There was no answer. The voicemail stated that their office is open Mon-Fri from 7am to 3pm

## 2017-11-04 NOTE — Telephone Encounter (Signed)
   Callback staff can you please find out if these are done with local anesthesia or under a surgical extraction/more comprehensive anesthesia setting?  Charlie Pitter, PA-C 11/04/2017, 4:17 PM

## 2017-11-04 NOTE — Telephone Encounter (Signed)
   [facility] Pre-operative Risk Assessment    Request for surgical clearance:  1. What type of surgery is being performed? 5 TEETH EXTRACTIONS   2. When is this surgery scheduled? TBD   3. What type of clearance is required (medical clearance vs. Pharmacy clearance to hold med vs. Both)? PHARMACY  4. Are there any medications that need to be held prior to surgery and how long?WARFARIN   5. Practice name and name of physician performing surgery? [facility]   6. What is your office phone number [phone]    7.   What is your office fax number [phone]  8.   Anesthesia type (None, local, MAC, general) ? UNKNOWN   [name] 11/04/2017, 12:00 PM  _________________________________________________________________   (provider comments below)

## 2017-11-05 ENCOUNTER — Telehealth: Payer: Self-pay | Admitting: Cardiovascular Disease

## 2017-11-05 NOTE — Telephone Encounter (Signed)
Tried to call the dentist office re: procedure, cannot get through.  I contacted the pt and she thinks they was just numbing her but wasn't sure. Pt did state that they were hard to get in touch with. I advised the pt that if she speaks with them, to have them contact us, if we haven't already spoken with them, Will try again later.

## 2017-11-05 NOTE — Telephone Encounter (Signed)
Follow Up:   Pt called back and said they said they were not putting her to sleep for her teeth being extracted.They would use general.

## 2017-11-05 NOTE — Telephone Encounter (Signed)
This is a new phone note for the same patient that was addressed earlier. The comments below are conflicting. General anesthesia is the same thing as putting to sleep. Please call patient back and keep trying to get dental office. I'm going to remove this from the box since it's a duplicate note. Dayna Dunn PA-C

## 2017-11-05 NOTE — Telephone Encounter (Signed)
Pt is not really sure what they are going to do as far as anesthesia. Informed pt we tried to call again and got the answering machine, again. Will try again the next preop day.

## 2017-11-06 NOTE — Telephone Encounter (Signed)
I will route to pharmacy for their recommendations on Coumadin bridging and SBE prophylaxis and then contact the patient.  Kerin Ransom PA-C 11/06/2017 10:34 AM

## 2017-11-06 NOTE — Telephone Encounter (Signed)
Spoke with dr Clinton Sawyer office, they will be using local anesthesia. Will route back to the pre-op pool.

## 2017-11-06 NOTE — Telephone Encounter (Signed)
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    Procedure: 5 teeth extraction Date of procedure: TBD  CrCl 20 Platelet count 293  Per office protocol, patient can hold warfarin for 5 days prior to procedure.    Patient will need bridging with Lovenox (enoxaparin) around procedure.  We monitor INR at [facility] office and will arrange this.   Patient will need dental prophylaxis prior to procedure.  Rx was sent to pharmacy in May for antibiotics

## 2017-11-06 NOTE — Telephone Encounter (Signed)
   Primary Cardiologist: Dr Sallyanne Kuster  Chart reviewed as part of pre-operative protocol coverage. Given past medical history and time since last visit, based on ACC/AHA guidelines, JACQUALINE WEICHEL would be at acceptable risk for the planned procedure without further cardiovascular testing.   See pharmacy recommendations regarding holding Coumadin 5 days pre op and Lovenox bridging- they will arrange. She will also need SBE prophylaxis and this has been arranged as well.   I will route this recommendation to the requesting party via Epic fax function and remove from pre-op pool.  Please call with questions.  Kerin Ransom, PA-C 11/06/2017, 2:42 PM

## 2017-11-10 ENCOUNTER — Ambulatory Visit (INDEPENDENT_AMBULATORY_CARE_PROVIDER_SITE_OTHER): Payer: Medicare Other | Admitting: Pharmacist

## 2017-11-10 DIAGNOSIS — Z7901 Long term (current) use of anticoagulants: Secondary | ICD-10-CM | POA: Diagnosis not present

## 2017-11-10 DIAGNOSIS — Z952 Presence of prosthetic heart valve: Secondary | ICD-10-CM | POA: Diagnosis not present

## 2017-11-10 LAB — POCT INR: INR: 2 (ref 2.0–3.0)

## 2017-11-10 MED ORDER — ENOXAPARIN SODIUM 40 MG/0.4ML ~~LOC~~ SOLN: 40.0000 mg | SUBCUTANEOUS | 0 refills | Status: DC

## 2017-11-10 NOTE — Progress Notes (Signed)
6/12: Last dose of Coumadin.  6/13: No Coumadin or Lovenox (enoxaparin).  6/14: Inject Lovenox(enoxaprin) 31m in the fatty abdominal tissue at least 2 inches from the belly button every morning. No Coumadin.  6/15: Inject Lovenox in the fatty tissue in AM. No Coumadin.  6/16: Inject Lovenox in the fatty tissue in AM. No Coumadin.  6/17: Inject Lovenox in the fatty tissue in AM. No Coumadin.  11/17/17: Procedure Day - No Lovenox - Resume Coumadin in the evening or as directed by doctor   6/19: Resume Lovenox in the fatty tissue in AM and take Coumadin 563m  6/20: Inject Lovenox in the fatty tissue in AMand take Coumadin 4m13m 6/21: Inject Lovenox in the fatty tissue in AM and take Coumadin 3.74m64m6/22: Inject Lovenox in the fatty tissue in AM and take Coumadin 3.74mg34m/23: Inject Lovenox in the fatty tissue in AMand take Coumadin 2.4mg. 99m24: Coumadin appt to check INR at 10:30am

## 2017-11-10 NOTE — Patient Instructions (Signed)
6/12: Last dose of Coumadin.  6/13: No Coumadin or Lovenox (enoxaparin).  6/14: Inject Lovenox(enoxaprin) 352m in the fatty abdominal tissue at least 2 inches from the belly button every morning. No Coumadin.  6/15: Inject Lovenox in the fatty tissue in AM. No Coumadin.  6/16: Inject Lovenox in the fatty tissue in AM. No Coumadin.  6/17: Inject Lovenox in the fatty tissue in AM. No Coumadin.  11/17/17: Procedure Day - No Lovenox - Resume Coumadin in the evening or as directed by doctor   6/19: Resume Lovenox in the fatty tissue in AM and take Coumadin 533m  6/20: Inject Lovenox in the fatty tissue in AMand take Coumadin 52m18m 6/21: Inject Lovenox in the fatty tissue in AM and take Coumadin 3.752m66m6/22: Inject Lovenox in the fatty tissue in AM and take Coumadin 3.752mg68m/23: Inject Lovenox in the fatty tissue in AMand take Coumadin 2.52mg. 63m24: Coumadin appt to check INR at 10:30am

## 2017-11-23 ENCOUNTER — Ambulatory Visit (INDEPENDENT_AMBULATORY_CARE_PROVIDER_SITE_OTHER): Payer: Medicare Other | Admitting: Pharmacist

## 2017-11-23 DIAGNOSIS — Z952 Presence of prosthetic heart valve: Secondary | ICD-10-CM | POA: Diagnosis not present

## 2017-11-23 DIAGNOSIS — Z7901 Long term (current) use of anticoagulants: Secondary | ICD-10-CM

## 2017-11-23 LAB — POCT INR: INR: 2.8 (ref 2.0–3.0)

## 2017-12-01 ENCOUNTER — Other Ambulatory Visit: Payer: Self-pay | Admitting: Cardiovascular Disease

## 2017-12-01 NOTE — Telephone Encounter (Signed)
°*  STAT* If patient is at the pharmacy, call can be transferred to refill team.   1. Which medications need to be refilled? (please list name of each medication and dose if known) Warfarin  2. Which pharmacy/location (including street and city if local pharmacy) is medication to be sent to?[facility] [address] [place]  3. Do they need a 30 day or 90 day supply? 90 and refills

## 2017-12-01 NOTE — Telephone Encounter (Signed)
New Message:       *STAT* If patient is at the pharmacy, call can be transferred to refill team.   1. Which medications need to be refilled? (please list name of each medication and dose if known) warfarin (COUMADIN) 2.5 MG tablet  2. Which pharmacy/location (including street and city if local pharmacy) is medication to be sent to?[id number] [address] 3. Do they need a 30 day or 90 day supply? [address]

## 2017-12-04 LAB — CUP PACEART INCLINIC DEVICE CHECK
Date Time Interrogation Session: 20190705155456
Implantable Lead Implant Date: 20080221
Implantable Lead Implant Date: 20080221
Implantable Lead Location: 753858
Implantable Lead Model: 4543
Implantable Lead Model: 5076
Implantable Lead Model: 7002
Implantable Lead Serial Number: 134379
Implantable Pulse Generator Implant Date: 20181128
MDC IDC LEAD IMPLANT DT: 20080221
MDC IDC LEAD LOCATION: 753859
MDC IDC LEAD LOCATION: 753860

## 2017-12-07 ENCOUNTER — Ambulatory Visit (INDEPENDENT_AMBULATORY_CARE_PROVIDER_SITE_OTHER): Payer: Medicare Other | Admitting: Pharmacist Clinician (PhC)/ Clinical Pharmacy Specialist

## 2017-12-07 DIAGNOSIS — Z7901 Long term (current) use of anticoagulants: Secondary | ICD-10-CM

## 2017-12-07 DIAGNOSIS — Z952 Presence of prosthetic heart valve: Secondary | ICD-10-CM | POA: Diagnosis not present

## 2017-12-07 LAB — POCT INR: INR: 2.6 (ref 2.0–3.0)

## 2017-12-23 ENCOUNTER — Other Ambulatory Visit: Payer: Self-pay | Admitting: Cardiovascular Disease

## 2017-12-23 NOTE — Telephone Encounter (Signed)
Rx sent to pharmacy   

## 2017-12-27 ENCOUNTER — Encounter (HOSPITAL_COMMUNITY): Payer: Self-pay | Admitting: Emergency Medicine

## 2017-12-27 ENCOUNTER — Emergency Department (HOSPITAL_COMMUNITY): Payer: Medicare Other

## 2017-12-27 ENCOUNTER — Other Ambulatory Visit: Payer: Self-pay

## 2017-12-27 ENCOUNTER — Emergency Department (HOSPITAL_COMMUNITY)
Admission: EM | Admit: 2017-12-27 | Discharge: 2017-12-27 | Disposition: A | Discharge status: Home / Self Care | Payer: Medicare Other | Attending: Emergency Medicine | Admitting: Emergency Medicine

## 2017-12-27 DIAGNOSIS — Z79899 Other long term (current) drug therapy: Secondary | ICD-10-CM | POA: Insufficient documentation

## 2017-12-27 DIAGNOSIS — Z87891 Personal history of nicotine dependence: Secondary | ICD-10-CM | POA: Insufficient documentation

## 2017-12-27 DIAGNOSIS — I5042 Chronic combined systolic (congestive) and diastolic (congestive) heart failure: Secondary | ICD-10-CM | POA: Diagnosis not present

## 2017-12-27 DIAGNOSIS — M25412 Effusion, left shoulder: Secondary | ICD-10-CM | POA: Diagnosis not present

## 2017-12-27 DIAGNOSIS — Z7901 Long term (current) use of anticoagulants: Secondary | ICD-10-CM | POA: Insufficient documentation

## 2017-12-27 DIAGNOSIS — N183 Chronic kidney disease, stage 3 (moderate): Secondary | ICD-10-CM | POA: Insufficient documentation

## 2017-12-27 DIAGNOSIS — I130 Hypertensive heart and chronic kidney disease with heart failure and stage 1 through stage 4 chronic kidney disease, or unspecified chronic kidney disease: Secondary | ICD-10-CM | POA: Diagnosis not present

## 2017-12-27 DIAGNOSIS — J45909 Unspecified asthma, uncomplicated: Secondary | ICD-10-CM | POA: Insufficient documentation

## 2017-12-27 DIAGNOSIS — M25512 Pain in left shoulder: Secondary | ICD-10-CM

## 2017-12-27 MED ORDER — HYDROCODONE-ACETAMINOPHEN 5-325 MG PO TABS
1.0000 | ORAL_TABLET | Freq: Once | ORAL | Status: AC
  Administered 2017-12-27: 1 via ORAL
  Filled 2017-12-27: qty 1

## 2017-12-27 MED ORDER — HYDROCODONE-ACETAMINOPHEN 5-325 MG PO TABS: 1.0000 | ORAL_TABLET | ORAL | 0 refills | Status: DC | PRN

## 2017-12-27 NOTE — ED Triage Notes (Signed)
Pt. Stated, I must have laid on it during the night yesterday and its been hurting since yesterday. Left shoulder.

## 2017-12-27 NOTE — ED Notes (Signed)
Patient states she is here for pain medication for ongoing left shoulder concern.

## 2017-12-27 NOTE — ED Provider Notes (Signed)
[facility] EMERGENCY DEPARTMENT Provider Note   CSN: [id number] Arrival date & time: 12/27/17  2706     History   Chief Complaint Chief Complaint  Patient presents with  . Shoulder Pain    HPI [name] is a 77 y.o. female.  Patient with history of significant arthritis, right shoulder replacement secondarily presents with left shoulder pain worsening since Saturday.  She has had left shoulder pain for months however it worsened she feels she was sleeping on it.  No fevers or chills or vomiting.  No direct trauma injury.     Past Medical History:  Diagnosis Date  . Allergy    seasonal  . Anemia   . Anxiety   . Arthritis   . Asthma   . 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 Protecta  . Biventricular ICD (implantable cardioverter-defibrillator) in place    with CRT  . Blood transfusion without reported diagnosis   . Bursitis   . Cataract    RIGHT EYE  . CHF (congestive heart failure) ([place])   . Chronic kidney disease   . Colon polyp    adenomatous  . Complication of anesthesia    patient stated that had difficulty getting the breathing tube removed, patient said that she stopped breathing and HR dropped to 10  patient then woke up and started breathing pateint stated no longer than one minute; re-intubated in PACU following cholecystectomy 10/28/13  . COPD (chronic obstructive pulmonary disease) ([place])   . Depression   . Diverticulosis   . Dysrhythmia   . Fibromyalgia   . GERD (gastroesophageal reflux disease)   . Gout   . H/O mitral valve replacement 2002, 2007  . Heart murmur   . Hemorrhoids   . Hyperlipidemia   . Hypertension   . Hypothyroidism   . IBS (irritable bowel syndrome)   . MVP (mitral valve prolapse)   . Neuromuscular disorder (HCC)    fibromyalgia  . Pacemaker   . Peptic ulcer disease     Patient Active Problem List   Diagnosis Date Noted  . Peptic ulcer disease   . Pacemaker   .  Neuromuscular disorder ([place])   . MVP (mitral valve prolapse)   . IBS (irritable bowel syndrome)   . Hypothyroidism   . Hemorrhoids   . Hypertension   . Hyperlipidemia   . Heart murmur   . H/O mitral valve replacement   . Gout   . GERD (gastroesophageal reflux disease)   . Fibromyalgia   . Dysrhythmia   . Diverticulosis   . Depression   . COPD (chronic obstructive pulmonary disease) ([place])   . Complication of anesthesia   . Colon polyp   . Chronic kidney disease   . CHF (congestive heart failure) ([place])   . Cataract   . Bursitis   . Asthma   . Arthritis   . Anxiety   . Anemia   . Allergy   . Pacemaker battery depletion 04/28/2017  . Biventricular automatic implantable cardioverter defibrillator at end of life 04/28/2017  . Malnutrition of moderate degree Altamease Oiler: 60% to less than 75% of standard weight) ([place]) 03/30/2017  . CKD (chronic kidney disease) stage 3, GFR 30-59 ml/min (HCC) 03/30/2017  . Severe protein-calorie malnutrition ([place]) 09/22/2016  . Chronic combined systolic and diastolic heart failure ([place]) 03/25/2016  . Paroxysmal atrial fibrillation ([place]) 03/25/2016  . S/P shoulder replacement 12/27/2015  . Cholelithiasis with cholecystitis 10/28/2013  . Long term current use of  anticoagulant therapy 08/19/2012  . Biventricular ICD (implantable cardioverter-defibrillator) in place 04/10/2011  . History of mitral valve replacement with mechanical valve 04/10/2011  . CHEST PAIN 01/24/2010  . Automatic implantable cardioverter-defibrillator in situ 01/24/2010  . RECTAL BLEEDING 08/01/2009  . NAUSEA AND VOMITING 08/01/2009  . UTI'S, HX OF 08/01/2009  . PERSONAL HX COLONIC POLYPS 08/01/2008  . HYPERLIPIDEMIA 07/31/2008  . ANEMIA, CHRONIC 07/31/2008  . INTERNAL HEMORRHOIDS 07/31/2008  . GERD 07/31/2008  . DIVERTICULOSIS, COLON 07/31/2008  . BURSITIS 07/31/2008    Past Surgical History:  Procedure Laterality Date  . ABDOMINAL HYSTERECTOMY    . BIV ICD GENERATOR  CHANGEOUT N/A 04/29/2017   Procedure: BIV ICD GENERATOR CHANGEOUT;  Surgeon: Sanda Klein, MD;  Location: [facility] CV [facility];  Service: Cardiovascular;  Laterality: N/A;  . CARDIAC CATHETERIZATION  02/25/2006   normal left main, normal LAD, normal L Cfx, normal/dominant RCA (Dr. [name])  . CARDIAC DEFIBRILLATOR PLACEMENT  2007, 11/202012   x2 (pacemaker) (Dr. Jerilynn Mages. Croitoru)  . CARDIAC VALVE REPLACEMENT  2002   MV repair - Dr. Berle Mull  . CHOLECYSTECTOMY N/A 10/28/2013   Procedure: LAPAROSCOPIC CHOLECYSTECTOMY WITH INTRAOPERATIVE CHOLANGIOGRAM;  Surgeon: Edward Jolly, MD;  Location: [facility];  Service: General;  Laterality: N/A;  . COLONOSCOPY    . EYE SURGERY    . IMPLANTABLE CARDIOVERTER DEFIBRILLATOR (ICD) GENERATOR CHANGE N/A 04/10/2011   Procedure: ICD GENERATOR CHANGE;  Surgeon: Sanda Klein, MD;  Location: [facility] CATH LAB;  Service: Cardiovascular;  Laterality: N/A;  . KNEE ARTHROSCOPY    . MITRAL VALVE REPLACEMENT  02/26/2006   re-do MVR w/23m St. [name] (Dr. BEllison Hughs  . NM MYOCAR PERF WALL MOTION  2005   persantine myoview - low ris, EF 63%  . RIGHT HEART CATH  04/03/2006   pulm cap wedge pressure 24/24, PA pressure 43/22 (mean 368mg), CO 4.8, CI 4.1 (Dr. J.Jackie Plum . TOTAL SHOULDER ARTHROPLASTY Right 12/27/2015   Procedure: RIGHT TOTAL SHOULDER ARTHROPLASTY;  Surgeon: JuTania AdeMD;  [facility]: [facility];  Laterality: Right;  Right total shoulder arthroplasty  . TRANSTHORACIC ECHOCARDIOGRAM  12/2011   EF 50-55%, mild global hypokinesis; LA severely dilated; calcification of anterior/posterior MV leaflets, bi-leaflets St. [name] mechanical MV; mild TR; trace AV regurg/pulm valve regurg     OB History   None      Home Medications    Prior to Admission medications   Medication Sig Start Date End Date Taking? Authorizing Provider  allopurinol (ZYLOPRIM) 300 MG tablet Take 300 mg by mouth every evening.     [provider]  ALPRAZolam  (XDuanne Moron0.25 MG tablet Take 0.25 mg by mouth 3 (three) times daily as needed for anxiety.    [provider]  amoxicillin (AMOXIL) 500 MG capsule TAKE 4 CAPSULES BY MOUTH 1 HOUR PRIOR TO DENTAL APPOINTMENT 10/14/17   Croitoru, Mihai, MD  buPROPion (WELLBUTRIN) 100 MG tablet Take 100 mg by mouth daily. 09/12/16   [provider]  enoxaparin (LOVENOX) 40 MG/0.4ML injection Inject 0.4 mLs (40 mg total) into the skin daily. 11/10/17   BeLorretta HarpMD  feeding supplement, ENSURE ENLIVE, (ENSURE ENLIVE) LIQD Take 237 mLs by mouth 2 (two) times daily between meals. 05/07/17   Barrett, RhEvelene CroonPA-C  furosemide (LASIX) 40 MG tablet Take 1 tablet (40 mg total) by mouth daily. Take extra 40 mg daily prn for weight gain 3lbs/day or 5 lbs/week or if you wake up with edema 05/07/17  Barrett, Evelene Croon, PA-C  HYDROcodone-acetaminophen (NORCO) 5-325 MG tablet Take 1 tablet by mouth every 4 (four) hours as needed. 12/27/17   Elnora Morrison, MD  ibuprofen (ADVIL,MOTRIN) 200 MG tablet Take 200 mg daily as needed by mouth for moderate pain.    [provider]  metoprolol succinate (TOPROL-XL) 25 MG 24 hr tablet Take 1.5 tablets (37.5 mg total) by mouth daily. 10/29/17   Croitoru, Mihai, MD  pravastatin (PRAVACHOL) 40 MG tablet Take 40 mg at bedtime by mouth.     [provider]  PROAIR HFA 108 (90 BASE) MCG/ACT inhaler Inhale 1-2 puffs into the lungs every 6 (six) hours as needed for wheezing.  01/23/15   [provider]  ranitidine (ZANTAC) 150 MG tablet Take 150 mg daily by mouth.    [provider]  Sennosides (SENNA) 15 MG TABS Take 1 tablet at bedtime by mouth.     [provider]  spironolactone (ALDACTONE) 25 MG tablet Take 1 tablet (25 mg total) by mouth daily. Keep OV 12/23/17   Croitoru, Mihai, MD  SYNTHROID 50 MCG tablet Take 50 mcg by mouth daily. 06/05/16   [provider]  warfarin (COUMADIN) 2.5 MG tablet TAKE 1 TABLETS BY MOUTH DAILY OR  AS DIRECTED BY COUMADIN CLINIC 12/01/17   Lorretta Harp, MD    Family History Family History  Problem Relation Age of Onset  . Breast cancer Sister        multiple  . Uterine cancer Sister        multiple  . Heart disease Mother   . Heart attack Mother   . Heart disease Father   . Kidney disease Father   . Heart attack Father   . Heart disease Sister   . Crohn's disease Sister   . Heart disease Son   . Colon cancer Neg Hx   . Stomach cancer Neg Hx     Social History Social History   Tobacco Use  . Smoking status: Former Smoker    Types: Cigarettes    Last attempt to quit: 06/02/2001    Years since quitting: 16.5  . Smokeless tobacco: Never Used  Substance Use Topics  . Alcohol use: Yes    Alcohol/week: 0.0 oz    Comment: social-1 every 6 months  . Drug use: No     Allergies   Ace inhibitors; Lipitor [atorvastatin calcium]; and Codeine   Review of Systems Review of Systems  Constitutional: Negative for chills and fever.  Respiratory: Negative for shortness of breath.   Cardiovascular: Negative for chest pain.  Gastrointestinal: Negative for abdominal pain and vomiting.  Genitourinary: Negative for dysuria and flank pain.  Musculoskeletal: Positive for neck pain. Negative for back pain and neck stiffness.  Skin: Negative for rash.     Physical Exam Updated Vital Signs BP (!) 146/63 (BP Location: Right Arm)   Pulse 70   Temp 98.7 F (37.1 C) (Oral)   Resp 17   Ht 5' 2"  (1.575 m)   Wt 49 kg (108 lb)   SpO2 100%   BMI 19.75 kg/m   Physical Exam  Constitutional: She is oriented to person, place, and time. She appears well-developed and well-nourished.  HENT:  Head: Normocephalic and atraumatic.  Eyes: Conjunctivae are normal. Right eye exhibits no discharge. Left eye exhibits no discharge.  Neck: Neck supple. No tracheal deviation present.  Cardiovascular: Normal rate.  Pulmonary/Chest: Effort normal.  Abdominal: Soft. She exhibits no distension.  There is no tenderness.  There is no guarding.  Musculoskeletal: She exhibits tenderness. She exhibits no edema.  Patient has moderate tenderness and tight musculature to left trapezius and posterior/anterior shoulder joint.  Mild effusion, no warmth or erythema.  Patient has decreased range of motion with flexion and abduction.  Normal muscle tone.  Neurological: She is alert and oriented to person, place, and time.  Skin: Skin is warm. No rash noted.  Psychiatric: She has a normal mood and affect.  Nursing note and vitals reviewed.    ED Treatments / Results  Labs (all labs ordered are listed, but only abnormal results are displayed) Labs Reviewed - No data to display  EKG None  Radiology Dg Shoulder Left  Result Date: 12/27/2017 CLINICAL DATA:  Left shoulder pain after sleeping. No reported injury. EXAM: LEFT SHOULDER - 2+ VIEW COMPARISON:  04/09/2017 left shoulder CT. FINDINGS: No fracture. No dislocation. No evidence of left acromioclavicular separation. No suspicious focal osseous lesions. New prominent widening of the left glenohumeral joint space. Underlying severe left glenohumeral osteoarthritis. Intact sternotomy wires. Partially visualized multi lead left subclavian ICD. IMPRESSION: New prominent widening of the left glenohumeral joint space presumably due to joint effusion. Underlying severe left glenohumeral osteoarthritis. No fracture or dislocation. Electronically Signed   By: Ilona Sorrel M.D.   On: 12/27/2017 08:19    Procedures Procedures (including critical care time)  Medications Ordered in ED Medications  HYDROcodone-acetaminophen (NORCO/VICODIN) 5-325 MG per tablet 1 tablet (1 tablet Oral Given 12/27/17 0839)     Initial Impression / Assessment and Plan / ED Course  I have reviewed the triage vital signs and the nursing notes.  Pertinent labs & imaging results that were available during my care of the patient were reviewed by me and considered in my medical  decision making (see chart for details).    Patient presents with clinical concern for acute on chronic arthritis of the left shoulder.  X-ray performed and reviewed showing significant arthritis with joint effusion.  Patient has orthopedics to follow-up with early this week.  At this time pretest probability for joint infection is very low however discussed if worsening symptoms, fever or other concerns we may need to obtain a sample of that fluid.  Patient comfortable with orthopedic follow-up.  Pain meds given in the ER.  Results and differential diagnosis were discussed with the patient/parent/guardian. Xrays were independently reviewed by myself.  Close follow up outpatient was discussed, comfortable with the plan.   Medications  HYDROcodone-acetaminophen (NORCO/VICODIN) 5-325 MG per tablet 1 tablet (1 tablet Oral Given 12/27/17 0839)    Vitals:   12/27/17 0746 12/27/17 0749  BP: (!) 146/63   Pulse: 70   Resp: 17   Temp: 98.7 F (37.1 C)   TempSrc: Oral   SpO2: 100%   Weight:  49 kg (108 lb)  Height:  5' 2"  (1.575 m)    Final diagnoses:  Shoulder effusion, left  Acute pain of left shoulder     Final Clinical Impressions(s) / ED Diagnoses   Final diagnoses:  Shoulder effusion, left  Acute pain of left shoulder    ED Discharge Orders        Ordered    HYDROcodone-acetaminophen (NORCO) 5-325 MG tablet  Every 4 hours PRN     12/27/17 0906       Elnora Morrison, MD 12/27/17 [phone]

## 2017-12-27 NOTE — Discharge Instructions (Signed)
Follow up with your bone doctor early this week.  For severe pain take norco or vicodin however realize they have the potential for addiction and it can make you sleepy and has tylenol in it.  No operating machinery while taking. If you were given medicines take as directed.  If you are on coumadin or contraceptives realize their levels and effectiveness is altered by many different medicines.  If you have any reaction (rash, tongues swelling, other) to the medicines stop taking and see a physician.    If your blood pressure was elevated in the ER make sure you follow up for management with a primary doctor or return for chest pain, shortness of breath or stroke symptoms.  Please follow up as directed and return to the ER or see a physician for new or worsening symptoms.  Thank you. Vitals:   12/27/17 0746 12/27/17 0749  BP: (!) 146/63   Pulse: 70   Resp: 17   Temp: 98.7 F (37.1 C)   TempSrc: Oral   SpO2: 100%   Weight:  49 kg (108 lb)  Height:  5' 2"  (1.575 m)

## 2017-12-27 NOTE — ED Triage Notes (Signed)
Pt. Stated, Im suppose to have a total shoulder replacement but not until I have a new pacemaker different place

## 2017-12-30 ENCOUNTER — Ambulatory Visit (INDEPENDENT_AMBULATORY_CARE_PROVIDER_SITE_OTHER): Payer: Medicare Other | Admitting: Pharmacist Clinician (PhC)/ Clinical Pharmacy Specialist

## 2017-12-30 DIAGNOSIS — Z7901 Long term (current) use of anticoagulants: Secondary | ICD-10-CM

## 2017-12-30 DIAGNOSIS — Z952 Presence of prosthetic heart valve: Secondary | ICD-10-CM | POA: Diagnosis not present

## 2017-12-30 LAB — POCT INR: INR: 5.7 — AB (ref 2.0–3.0)

## 2018-01-08 ENCOUNTER — Ambulatory Visit (INDEPENDENT_AMBULATORY_CARE_PROVIDER_SITE_OTHER): Payer: Medicare Other | Admitting: Pharmacist Clinician (PhC)/ Clinical Pharmacy Specialist

## 2018-01-08 DIAGNOSIS — Z952 Presence of prosthetic heart valve: Secondary | ICD-10-CM

## 2018-01-08 DIAGNOSIS — Z7901 Long term (current) use of anticoagulants: Secondary | ICD-10-CM | POA: Diagnosis not present

## 2018-01-08 LAB — POCT INR: INR: 2.4 (ref 2.0–3.0)

## 2018-01-27 ENCOUNTER — Encounter: Payer: Self-pay | Admitting: Cardiovascular Disease

## 2018-01-27 ENCOUNTER — Ambulatory Visit (INDEPENDENT_AMBULATORY_CARE_PROVIDER_SITE_OTHER): Payer: Medicare Other | Admitting: Pharmacist Clinician (PhC)/ Clinical Pharmacy Specialist

## 2018-01-27 ENCOUNTER — Ambulatory Visit: Payer: Medicare Other | Admitting: Cardiovascular Disease

## 2018-01-27 VITALS — BP 122/60 | HR 71 | Ht 62.0 in | Wt 105.2 lb

## 2018-01-27 DIAGNOSIS — I5042 Chronic combined systolic (congestive) and diastolic (congestive) heart failure: Secondary | ICD-10-CM | POA: Diagnosis not present

## 2018-01-27 DIAGNOSIS — Z952 Presence of prosthetic heart valve: Secondary | ICD-10-CM

## 2018-01-27 DIAGNOSIS — N183 Chronic kidney disease, stage 3 unspecified: Secondary | ICD-10-CM

## 2018-01-27 DIAGNOSIS — Z95810 Presence of automatic (implantable) cardiac defibrillator: Secondary | ICD-10-CM

## 2018-01-27 DIAGNOSIS — Z7901 Long term (current) use of anticoagulants: Secondary | ICD-10-CM

## 2018-01-27 DIAGNOSIS — I480 Paroxysmal atrial fibrillation: Secondary | ICD-10-CM | POA: Diagnosis not present

## 2018-01-27 DIAGNOSIS — I498 Other specified cardiac arrhythmias: Secondary | ICD-10-CM

## 2018-01-27 DIAGNOSIS — E440 Moderate protein-calorie malnutrition: Secondary | ICD-10-CM

## 2018-01-27 LAB — CUP PACEART INCLINIC DEVICE CHECK
Implantable Lead Implant Date: 20080221
Implantable Lead Implant Date: 20080221
Implantable Lead Location: 753858
Implantable Lead Location: 753860
Implantable Lead Model: 4543
Implantable Lead Model: 5076
Implantable Lead Serial Number: 134379
MDC IDC LEAD IMPLANT DT: 20080221
MDC IDC LEAD LOCATION: 753859
MDC IDC PG IMPLANT DT: 20181128
MDC IDC SESS DTM: 20190828100458

## 2018-01-27 LAB — POCT INR: INR: 5.3 — AB (ref 2.0–3.0)

## 2018-01-27 NOTE — Patient Instructions (Signed)
Dr Sallyanne Kuster recommends that you continue on your current medications as directed. Please refer to the Current Medication list given to you today.  Remote monitoring is used to monitor your Pacemaker or ICD from home. This monitoring reduces the number of office visits required to check your device to one time per year. It allows [place] to keep an eye on the functioning of your device to ensure it is working properly. You are scheduled for a device check from home on Wednesday, November 27th, 2019. You may send your transmission at any time that day. If you have a wireless device, the transmission will be sent automatically. After your physician reviews your transmission, you will receive a notification with your next transmission date.  To improve our patient care and to more adequately follow your device, [facility] has decided, as a practice, to start following each patient four times a year with your home monitor. This means that you may experience a remote appointment that is close to an in-office appointment with your physician. Your insurance will apply at the same rate as other remote monitoring transmissions.  Dr Sallyanne Kuster recommends that you schedule a follow-up appointment in 12 months with an ICD check. You will receive a reminder letter in the mail two months in advance. If you don't receive a letter, please call our office to schedule the follow-up appointment.  If you need a refill on your cardiac medications before your next appointment, please call your pharmacy.

## 2018-01-27 NOTE — Progress Notes (Signed)
Cardiology Office Note    Date:  01/29/2018   ID:  [name], DOB 19-May-1941, MRN [id number]  PCP:  Leanna Battles, MD  Cardiologist:  Quay Burow, M.D.; Sanda Klein, MD   Chief Complaint  Patient presents with  CRT-D check    History of Present Illness:  [name] is a 77 y.o. female here for follow-up on her biventricular pacemaker/defibrillator  ([place], generator changeout in November 2018) and chronic heart failure  Device function is normal.  She has 75% atrial pacing and 96% biventricular pacing.  Effective biventricular pacing is markedly improved to 95%.  She was having problems with accelerated junctional rhythm that was precluding biventricular pacing, we have largely overcome this by increasing her lower rate limit to 70 bpm.  She has not had any ventricular tachycardia or atrial fibrillation.  Unfortunately, it seems that her problems with uncontrollable vomiting have recurred and she has lost weight again.  Her BMI still around 19.  She is had extensive work-up and treatment with her gastroenterologist Dr. Henrene Pastor.  Complains of fatigue.  She has had extensive lab work-up with Dr. Sharlett Iles, apparently without a clear diagnosis.  She is only had a couple of instances where she had to take extra furosemide over the last 3 months.  Her device does show a dip in her thoracic impedance in July, around the time of prep for her colonoscopy.  This resolved quickly and there was even a short period of "overshoot" when she was probably a little dry, returned to baseline quickly as well.  The patient specifically denies any chest pain at rest exertion, dyspnea at rest or with exertion, orthopnea, paroxysmal nocturnal dyspnea, syncope, palpitations, focal neurological deficits, intermittent claudication, lower extremity edema, unexplained weight gain, cough, hemoptysis or wheezing.  She is on chronic warfarin anticoagulation for mitral valve replacement with  a mechanical prosthesis and paroxysmal atrial fibrillation.   Echo performed August 05, 2017 showed left ventricular ejection fraction of 50-55% and normal function of the mitral valve prosthesis, mean gradient 3 mm Hg.  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 Left ventricular ejection fraction which has been as low as 10% improved substantially after implantation of the CRT-D device and now her LVEF is near normal. She received her initial CRT-D in 2008 and underwent a generator change out in 2012 and 2019. She has not received VT therapy. She has a Riata 7 [place] ICD lead under advisory, which appeared fluoroscopically normal at the time of last generator change and which still has normal parameters. Did not have any angiographic CAD prior to her surgery.   Past Medical History:  Diagnosis Date  . Allergy    seasonal  . Anemia   . Anxiety   . Arthritis   . Asthma   . 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 Protecta  . Biventricular ICD (implantable cardioverter-defibrillator) in place    with CRT  . Blood transfusion without reported diagnosis   . Bursitis   . Cataract    RIGHT EYE  . CHF (congestive heart failure) ([place])   . Chronic kidney disease   . Colon polyp    adenomatous  . Complication of anesthesia    patient stated that had difficulty getting the breathing tube removed, patient said that she stopped breathing and HR dropped to 10  patient then woke  up and started breathing pateint stated no longer than one minute; re-intubated in PACU following cholecystectomy 10/28/13  . COPD (chronic obstructive pulmonary disease) ([place])   . Depression   . Diverticulosis   . Dysrhythmia   . Fibromyalgia   . GERD (gastroesophageal reflux disease)   . Gout   . H/O mitral valve replacement 2002, 2007  . Heart murmur    . Hemorrhoids   . Hyperlipidemia   . Hypertension   . Hypothyroidism   . IBS (irritable bowel syndrome)   . MVP (mitral valve prolapse)   . Neuromuscular disorder (HCC)    fibromyalgia  . Pacemaker   . Peptic ulcer disease     Past Surgical History:  Procedure Laterality Date  . ABDOMINAL HYSTERECTOMY    . BIV ICD GENERATOR CHANGEOUT N/A 04/29/2017   Procedure: BIV ICD GENERATOR CHANGEOUT;  Surgeon: Sanda Klein, MD;  Location: [facility];  Service: Cardiovascular;  Laterality: N/A;  . CARDIAC CATHETERIZATION  02/25/2006   normal left main, normal LAD, normal L Cfx, normal/dominant RCA (Dr. [name])  . CARDIAC DEFIBRILLATOR PLACEMENT  2007, 11/202012   x2 (pacemaker) (Dr. Jerilynn Mages. Arizbeth Cawthorn)  . CARDIAC VALVE REPLACEMENT  2002   MV repair - Dr. Berle Mull  . CHOLECYSTECTOMY N/A 10/28/2013   Procedure: LAPAROSCOPIC CHOLECYSTECTOMY WITH INTRAOPERATIVE CHOLANGIOGRAM;  Surgeon: Edward Jolly, MD;  Location: [facility];  Service: General;  Laterality: N/A;  . COLONOSCOPY    . EYE SURGERY    . IMPLANTABLE CARDIOVERTER DEFIBRILLATOR (ICD) GENERATOR CHANGE N/A 04/10/2011   Procedure: ICD GENERATOR CHANGE;  Surgeon: Sanda Klein, MD;  Location: [facility];  Service: Cardiovascular;  Laterality: N/A;  . KNEE ARTHROSCOPY    . MITRAL VALVE REPLACEMENT  02/26/2006   re-do MVR w/42m St. [name] (Dr. BEllison Hughs  . NM MYOCAR PERF WALL MOTION  2005   persantine myoview - low ris, EF 63%  . RIGHT HEART CATH  04/03/2006   pulm cap wedge pressure 24/24, PA pressure 43/22 (mean 343mg), CO 4.8, CI 4.1 (Dr. J.Jackie Plum . TOTAL SHOULDER ARTHROPLASTY Right 12/27/2015   Procedure: RIGHT TOTAL SHOULDER ARTHROPLASTY;  Surgeon: JuTania AdeMD;  Location: [facility] Service: Orthopedics;  Laterality: Right;  Right total shoulder arthroplasty  . TRANSTHORACIC ECHOCARDIOGRAM  12/2011   EF 50-55%, mild global hypokinesis; LA severely dilated; calcification of anterior/posterior MV leaflets, bi-leaflets  St. [name] mechanical MV; mild TR; trace AV regurg/pulm valve regurg    Current Medications: Outpatient Medications Prior to Visit  Medication Sig Dispense Refill  . allopurinol (ZYLOPRIM) 300 MG tablet Take 300 mg by mouth every evening.     . Marland KitchenLPRAZolam (XANAX) 0.25 MG tablet Take 0.25 mg by mouth 3 (three) times daily as needed for anxiety.    . Marland Kitchenmoxicillin (AMOXIL) 500 MG capsule TAKE 4 CAPSULES BY MOUTH 1 HOUR PRIOR TO DENTAL APPOINTMENT 12 capsule 1  . buPROPion (WELLBUTRIN) 100 MG tablet Take 100 mg by mouth daily.  12  . enoxaparin (LOVENOX) 40 MG/0.4ML injection Inject 0.4 mLs (40 mg total) into the skin daily. 20 Syringe 0  . feeding supplement, ENSURE ENLIVE, (ENSURE ENLIVE) LIQD Take 237 mLs by mouth 2 (two) times daily between meals. 237 mL 12  . furosemide (LASIX) 40 MG tablet Take 1 tablet (40 mg total) by mouth daily. Take extra 40 mg daily prn for weight gain 3lbs/day or 5 lbs/week or if you wake up with edema 90 tablet 9  . HYDROcodone-acetaminophen (NORCO) 5-325 MG  tablet Take 1 tablet by mouth every 4 (four) hours as needed. 12 tablet 0  . metoprolol succinate (TOPROL-XL) 25 MG 24 hr tablet Take 1.5 tablets (37.5 mg total) by mouth daily. 135 tablet 3  . pravastatin (PRAVACHOL) 40 MG tablet Take 40 mg at bedtime by mouth.     [name] PROAIR HFA 108 (90 BASE) MCG/ACT inhaler Inhale 1-2 puffs into the lungs every 6 (six) hours as needed for wheezing.     . ranitidine (ZANTAC) 150 MG tablet Take 150 mg daily by mouth.    . Sennosides (SENNA) 15 MG TABS Take 1 tablet at bedtime by mouth.     . spironolactone (ALDACTONE) 25 MG tablet Take 1 tablet (25 mg total) by mouth daily. Keep OV 90 tablet 0  . SYNTHROID 50 MCG tablet Take 50 mcg by mouth daily.  2  . warfarin (COUMADIN) 2.5 MG tablet TAKE 1 TABLETS BY MOUTH DAILY OR AS DIRECTED BY COUMADIN CLINIC 90 tablet 1  . ibuprofen (ADVIL,MOTRIN) 200 MG tablet Take 200 mg daily as needed by mouth for moderate pain.     Facility-Administered  Medications Prior to Visit  Medication Dose Route Frequency Provider Last Rate Last Dose  . 0.9 %  sodium chloride infusion  500 mL Intravenous Continuous Irene Shipper, MD         Allergies:   Ace inhibitors; Lipitor [atorvastatin calcium]; and Codeine   Social History   Socioeconomic History  . Marital status: Married    Spouse name: [name]  . Number of children: 4  . Years of education: 34  . Highest education level: Not on file  Occupational History  . Occupation: retired  Scientific laboratory technician  . Financial resource strain: Not on file  . Food insecurity:    Worry: Not on file    Inability: Not on file  . Transportation needs:    Medical: Not on file    Non-medical: Not on file  Tobacco Use  . Smoking status: Former Smoker    Types: Cigarettes    Last attempt to quit: 06/02/2001    Years since quitting: 16.6  . Smokeless tobacco: Never Used  Substance and Sexual Activity  . Alcohol use: Yes    Alcohol/week: 0.0 standard drinks    Comment: social-1 every 6 months  . Drug use: No  . Sexual activity: Not on file  Lifestyle  . Physical activity:    Days per week: Not on file    Minutes per session: Not on file  . Stress: Not on file  Relationships  . Social connections:    Talks on phone: Not on file    Gets together: Not on file    Attends religious service: Not on file    Active member of club or organization: Not on file    Attends meetings of clubs or organizations: Not on file    Relationship status: Not on file  Other Topics Concern  . Not on file  Social History Narrative   Drinks very little caffeine      Family History:  The patient's family history includes Breast cancer in her sister; Crohn's disease in her sister; Heart attack in her father and mother; Heart disease in her father, mother, sister, and son; Kidney disease in her father; Uterine cancer in her sister.   ROS:   Please see the history of present illness.    ROS All other systems reviewed and are  negative.   PHYSICAL EXAM:   VS:  BP 122/60   Pulse 71   Ht 5' 2"  (1.575 m)   Wt 105 lb 3.2 oz (47.7 kg)   BMI 19.24 kg/m     General: Alert, oriented x3, no distress, underweight Head: no evidence of trauma, PERRL, EOMI, no exophtalmos or lid lag, no myxedema, no xanthelasma; normal ears, nose and oropharynx Neck: normal jugular venous pulsations and no hepatojugular reflux; brisk carotid pulses without delay and no carotid bruits Chest:clear to auscultation, no signs of consolidation by percussion or palpation, normal fremitus, symmetrical and full respiratory excursions.  There is a lot of fat atrophy overlying the left sided subclavian pacemaker site, the skin itself appears healthy without evidence of adherence to the device or inflammation Cardiovascular: Crisp mechanical valve clicks, RRR; no murmurs, rubs, or gallops,no edema. Abdomen: no tenderness or distention, no masses by palpation, no abnormal pulsatility or arterial bruits, normal bowel sounds, no hepatosplenomegaly Extremities: no clubbing, cyanosis or edema; 2+ radial, ulnar and brachial pulses bilaterally; 2+ right femoral, posterior tibial and dorsalis pedis pulses; 2+ left femoral, posterior tibial and dorsalis pedis pulses; no subclavian or femoral bruits Neurological: grossly nonfocal Psych: Normal mood and affect  Wt Readings from Last 3 Encounters:  01/27/18 105 lb 3.2 oz (47.7 kg)  12/27/17 108 lb (49 kg)  10/29/17 108 lb 3.2 oz (49.1 kg)    Studies/Labs Reviewed:   EKG:  EKG is ordered today.  Shows atrial paced, biventricular paced rhythm with a positive R waves in lead V1.  Presenting intracardiac electrogram did show the previously described accelerated junctional rhythm, but by the time we did her ECG she was back to biventricular pacing  Recent Labs: 04/29/2017: ALT 14; BUN 15; Creatinine, Ser 1.34; Potassium 4.2; Sodium 136 05/07/2017: Hemoglobin 10.9; Platelets 270    ASSESSMENT:    1. Chronic  combined systolic and diastolic heart failure ([place])   2. Accelerated junctional rhythm   3. Biventricular ICD (implantable cardioverter-defibrillator) in place   4. History of mitral valve replacement with mechanical valve   5. Paroxysmal atrial fibrillation (HCC)   6. Long term current use of anticoagulant therapy   7. Malnutrition of moderate degree ([place]: 60% to less than 75% of standard weight) ([place])   8. CKD (chronic kidney disease) stage 3, GFR 30-59 ml/min (HCC)      PLAN:  In order of problems listed above:  1. CHF: NYHA functional class I, euvolemic. Infrequently requires diuretic dose escalation.  Echo in March 2019 with EF 50-55%. 2. Acc junctional rhythm: The adverse effect of her arrhythmia on biventricular pacing has largely been overcome with a higher dose of beta-blocker and adjustment in her device lower rate limit 3. CRT-D: s/p 3rd generator change in February 2019, site well healed.  Normal device function, now with 96% biventricular pacing. 4. S/P MVR mechanical prosthesis: Normal function of the prosthesis by her recent echo. 5. AFib: Despite being off all antiarrhythmics for over a year, she has not had any recurrence. 6. Warfarin: Whenever she has GI problems, anticoagulation becomes rather volatile.  That is also the case today, with an INR of 5.3; thankfully no serious bleeding or thrombotic complications. 7. Malnutrition: weight has dropped another few pounds and she is remaining under nourished. 8. CKD 3: Creatinine clearance around 35.  Medication Adjustments/Labs and Tests Ordered: Current medicines are reviewed at length with the patient today.  Concerns regarding medicines are outlined above.  Medication changes, Labs and Tests ordered today are listed in the Patient Instructions below. Patient  Instructions  Dr Sallyanne Kuster recommends that you continue on your current medications as directed. Please refer to the Current Medication list given to you  today.  Remote monitoring is used to monitor your Pacemaker or ICD from home. This monitoring reduces the number of office visits required to check your device to one time per year. It allows [place] to keep an eye on the functioning of your device to ensure it is working properly. You are scheduled for a device check from home on Wednesday, November 27th, 2019. You may send your transmission at any time that day. If you have a wireless device, the transmission will be sent automatically. After your physician reviews your transmission, you will receive a notification with your next transmission date.  To improve our patient care and to more adequately follow your device, [facility] has decided, as a practice, to start following each patient four times a year with your home monitor. This means that you may experience a remote appointment that is close to an in-office appointment with your physician. Your insurance will apply at the same rate as other remote monitoring transmissions.  Dr Sallyanne Kuster recommends that you schedule a follow-up appointment in 12 months with an ICD check. You will receive a reminder letter in the mail two months in advance. If you don't receive a letter, please call our office to schedule the follow-up appointment.  If you need a refill on your cardiac medications before your next appointment, please call your pharmacy.    Signed, Sanda Klein, MD  01/29/2018 8:56 AM    [facility] [address], [place], [place]  [address] Phone: [phone]; Fax: [phone]

## 2018-01-27 NOTE — Patient Instructions (Signed)
Description   No warfarin for 2 days, then continue with normal dose 1.5 tablets daily except 1 tablet each Monday, Wednesday and Friday.  Repeat INR in 2 weeks

## 2018-01-29 ENCOUNTER — Encounter: Payer: Medicare Other | Admitting: Cardiovascular Disease

## 2018-01-29 DIAGNOSIS — I498 Other specified cardiac arrhythmias: Secondary | ICD-10-CM | POA: Insufficient documentation

## 2018-02-10 ENCOUNTER — Ambulatory Visit (INDEPENDENT_AMBULATORY_CARE_PROVIDER_SITE_OTHER): Payer: Medicare Other | Admitting: Pharmacist

## 2018-02-10 DIAGNOSIS — Z952 Presence of prosthetic heart valve: Secondary | ICD-10-CM | POA: Diagnosis not present

## 2018-02-10 DIAGNOSIS — Z7901 Long term (current) use of anticoagulants: Secondary | ICD-10-CM

## 2018-02-10 LAB — POCT INR: INR: 3.9 — AB (ref 2.0–3.0)

## 2018-02-25 ENCOUNTER — Other Ambulatory Visit: Payer: Self-pay | Admitting: Internal Medicine

## 2018-02-25 DIAGNOSIS — Z1231 Encounter for screening mammogram for malignant neoplasm of breast: Secondary | ICD-10-CM

## 2018-03-01 ENCOUNTER — Ambulatory Visit (INDEPENDENT_AMBULATORY_CARE_PROVIDER_SITE_OTHER): Payer: Medicare Other | Admitting: Pharmacist Clinician (PhC)/ Clinical Pharmacy Specialist

## 2018-03-01 DIAGNOSIS — Z7901 Long term (current) use of anticoagulants: Secondary | ICD-10-CM

## 2018-03-01 DIAGNOSIS — Z952 Presence of prosthetic heart valve: Secondary | ICD-10-CM | POA: Diagnosis not present

## 2018-03-01 LAB — POCT INR: INR: 3.4 — AB (ref 2.0–3.0)

## 2018-03-16 ENCOUNTER — Other Ambulatory Visit: Payer: Self-pay | Admitting: Cardiovascular Disease

## 2018-03-29 ENCOUNTER — Ambulatory Visit (INDEPENDENT_AMBULATORY_CARE_PROVIDER_SITE_OTHER): Payer: Medicare Other | Admitting: Pharmacist

## 2018-03-29 DIAGNOSIS — Z7901 Long term (current) use of anticoagulants: Secondary | ICD-10-CM

## 2018-03-29 DIAGNOSIS — Z952 Presence of prosthetic heart valve: Secondary | ICD-10-CM

## 2018-03-29 LAB — POCT INR: INR: 4.3 — AB (ref 2.0–3.0)

## 2018-03-29 NOTE — Patient Instructions (Signed)
HOLD warfarin today ONLY, then resume at 1 tablet every day except on Thursday take 1.5 tablets. Repeat INR in 2 weeks *HOLD warfarin dose if severe diarrhea*

## 2018-04-01 ENCOUNTER — Ambulatory Visit
Admission: RE | Admit: 2018-04-01 | Discharge: 2018-04-01 | Disposition: A | Discharge status: Home / Self Care | Payer: Medicare Other | Source: Ambulatory Visit | Attending: Internal Medicine | Admitting: Internal Medicine

## 2018-04-01 DIAGNOSIS — Z1231 Encounter for screening mammogram for malignant neoplasm of breast: Secondary | ICD-10-CM

## 2018-04-03 ENCOUNTER — Other Ambulatory Visit: Payer: Self-pay | Admitting: Cardiovascular Disease

## 2018-04-12 ENCOUNTER — Ambulatory Visit (INDEPENDENT_AMBULATORY_CARE_PROVIDER_SITE_OTHER): Payer: Medicare Other | Admitting: Pharmacist Clinician (PhC)/ Clinical Pharmacy Specialist

## 2018-04-12 DIAGNOSIS — Z952 Presence of prosthetic heart valve: Secondary | ICD-10-CM

## 2018-04-12 DIAGNOSIS — Z7901 Long term (current) use of anticoagulants: Secondary | ICD-10-CM | POA: Diagnosis not present

## 2018-04-12 LAB — POCT INR: INR: 1.7 — AB (ref 2.0–3.0)

## 2018-04-12 NOTE — Patient Instructions (Signed)
Description   Take 1.5 tablets today Monday Nov 11.  Then resume at 1 tablet every day except on Thursday take 1.5 tablets. Repeat INR in 2 weeks *HOLD warfarin dose if severe diarrhea*

## 2018-04-28 ENCOUNTER — Ambulatory Visit (INDEPENDENT_AMBULATORY_CARE_PROVIDER_SITE_OTHER): Payer: Medicare Other

## 2018-04-28 ENCOUNTER — Ambulatory Visit (INDEPENDENT_AMBULATORY_CARE_PROVIDER_SITE_OTHER): Payer: Medicare Other | Admitting: Pharmacist Clinician (PhC)/ Clinical Pharmacy Specialist

## 2018-04-28 ENCOUNTER — Telehealth: Payer: Self-pay | Admitting: Cardiology

## 2018-04-28 DIAGNOSIS — I5032 Chronic diastolic (congestive) heart failure: Secondary | ICD-10-CM | POA: Diagnosis not present

## 2018-04-28 DIAGNOSIS — Z7901 Long term (current) use of anticoagulants: Secondary | ICD-10-CM | POA: Diagnosis not present

## 2018-04-28 DIAGNOSIS — Z952 Presence of prosthetic heart valve: Secondary | ICD-10-CM | POA: Diagnosis not present

## 2018-04-28 DIAGNOSIS — Z95810 Presence of automatic (implantable) cardiac defibrillator: Secondary | ICD-10-CM

## 2018-04-28 LAB — POCT INR: INR: 4.6 — AB (ref 2.0–3.0)

## 2018-04-28 NOTE — Progress Notes (Signed)
Remote ICD transmission.   

## 2018-04-28 NOTE — Telephone Encounter (Signed)
LMOVM reminding pt to send remote transmission.   

## 2018-05-06 ENCOUNTER — Encounter: Payer: Self-pay | Admitting: Cardiology

## 2018-05-13 ENCOUNTER — Ambulatory Visit (INDEPENDENT_AMBULATORY_CARE_PROVIDER_SITE_OTHER): Payer: Medicare Other | Admitting: Pharmacist

## 2018-05-13 ENCOUNTER — Encounter (INDEPENDENT_AMBULATORY_CARE_PROVIDER_SITE_OTHER): Payer: Self-pay

## 2018-05-13 DIAGNOSIS — Z7901 Long term (current) use of anticoagulants: Secondary | ICD-10-CM

## 2018-05-13 DIAGNOSIS — Z952 Presence of prosthetic heart valve: Secondary | ICD-10-CM

## 2018-05-13 LAB — POCT INR: INR: 5.3 — AB (ref 2.0–3.0)

## 2018-05-28 ENCOUNTER — Ambulatory Visit (INDEPENDENT_AMBULATORY_CARE_PROVIDER_SITE_OTHER): Payer: Medicare Other | Admitting: Pharmacist

## 2018-05-28 DIAGNOSIS — Z952 Presence of prosthetic heart valve: Secondary | ICD-10-CM | POA: Diagnosis not present

## 2018-05-28 DIAGNOSIS — Z7901 Long term (current) use of anticoagulants: Secondary | ICD-10-CM

## 2018-05-28 LAB — POCT INR: INR: 1.9 — AB (ref 2.0–3.0)

## 2018-06-09 ENCOUNTER — Ambulatory Visit (INDEPENDENT_AMBULATORY_CARE_PROVIDER_SITE_OTHER): Payer: Medicare Other | Admitting: Pharmacist

## 2018-06-09 DIAGNOSIS — Z7901 Long term (current) use of anticoagulants: Secondary | ICD-10-CM | POA: Diagnosis not present

## 2018-06-09 DIAGNOSIS — Z952 Presence of prosthetic heart valve: Secondary | ICD-10-CM

## 2018-06-09 LAB — POCT INR: INR: 3.2 — AB (ref 2.0–3.0)

## 2018-06-18 LAB — CUP PACEART REMOTE DEVICE CHECK
Battery Remaining Longevity: 101 mo
Brady Statistic AP VP Percent: 72.82 %
Brady Statistic AS VS Percent: 1.03 %
Brady Statistic RA Percent Paced: 73.07 %
Brady Statistic RV Percent Paced: 4.36 %
Date Time Interrogation Session: 20191127190120
HighPow Impedance: 90 Ohm
Implantable Lead Implant Date: 20080221
Implantable Lead Location: 753858
Implantable Lead Location: 753860
Implantable Lead Model: 4543
Implantable Lead Model: 5076
Implantable Lead Model: 7002
Implantable Lead Serial Number: 134379
Lead Channel Impedance Value: 399 Ohm
Lead Channel Impedance Value: 437 Ohm
Lead Channel Impedance Value: 532 Ohm
Lead Channel Impedance Value: 817 Ohm
Lead Channel Pacing Threshold Amplitude: 1.125 V
Lead Channel Pacing Threshold Pulse Width: 0.4 ms
Lead Channel Pacing Threshold Pulse Width: 0.4 ms
Lead Channel Sensing Intrinsic Amplitude: 2.375 mV
Lead Channel Sensing Intrinsic Amplitude: 2.375 mV
Lead Channel Sensing Intrinsic Amplitude: 31.375 mV
Lead Channel Setting Pacing Amplitude: 1.5 V
Lead Channel Setting Pacing Amplitude: 1.75 V
Lead Channel Setting Pacing Pulse Width: 0.4 ms
MDC IDC LEAD IMPLANT DT: 20080221
MDC IDC LEAD IMPLANT DT: 20080221
MDC IDC LEAD LOCATION: 753859
MDC IDC MSMT BATTERY VOLTAGE: 3.02 V
MDC IDC MSMT LEADCHNL LV IMPEDANCE VALUE: 494 Ohm
MDC IDC MSMT LEADCHNL LV PACING THRESHOLD PULSEWIDTH: 0.4 ms
MDC IDC MSMT LEADCHNL RA PACING THRESHOLD AMPLITUDE: 0.625 V
MDC IDC MSMT LEADCHNL RV IMPEDANCE VALUE: 399 Ohm
MDC IDC MSMT LEADCHNL RV PACING THRESHOLD AMPLITUDE: 0.875 V
MDC IDC MSMT LEADCHNL RV SENSING INTR AMPL: 31.375 mV
MDC IDC PG IMPLANT DT: 20181128
MDC IDC SET LEADCHNL LV PACING PULSEWIDTH: 0.4 ms
MDC IDC SET LEADCHNL RV PACING AMPLITUDE: 2 V
MDC IDC SET LEADCHNL RV SENSING SENSITIVITY: 0.3 mV
MDC IDC STAT BRADY AP VS PERCENT: 2.31 %
MDC IDC STAT BRADY AS VP PERCENT: 23.84 %

## 2018-06-23 ENCOUNTER — Ambulatory Visit (INDEPENDENT_AMBULATORY_CARE_PROVIDER_SITE_OTHER): Payer: Medicare Other | Admitting: Pharmacist Clinician (PhC)/ Clinical Pharmacy Specialist

## 2018-06-23 DIAGNOSIS — Z7901 Long term (current) use of anticoagulants: Secondary | ICD-10-CM | POA: Diagnosis not present

## 2018-06-23 DIAGNOSIS — Z952 Presence of prosthetic heart valve: Secondary | ICD-10-CM

## 2018-06-23 LAB — POCT INR: INR: 2.4 (ref 2.0–3.0)

## 2018-06-23 NOTE — Patient Instructions (Signed)
Take 1.5 tablets today Wednesday Jan 22, then resume taking 1 tablet every day except for 1.5 tabs every Friday. Repeat INR in 4 weeks *HOLD warfarin dose if severe diarrhea*

## 2018-07-07 ENCOUNTER — Telehealth: Payer: Self-pay | Admitting: Nurse Practitioner

## 2018-07-07 NOTE — Telephone Encounter (Signed)
   MDT technician called this evening to report that [employer] had received an RV lead integrity warning on [name]'s device.  All other device parameters were wnl.  Per caller, this report will be available to our office via the [employer] database.   Murray Hodgkins, NP 07/07/2018, 8:01 PM

## 2018-07-08 ENCOUNTER — Ambulatory Visit: Payer: Medicare Other | Admitting: Nurse Practitioner

## 2018-07-08 ENCOUNTER — Telehealth: Payer: Self-pay | Admitting: Cardiovascular Disease

## 2018-07-08 VITALS — BP 124/70 | HR 70 | Ht 62.0 in | Wt 111.0 lb

## 2018-07-08 DIAGNOSIS — T82110A Breakdown (mechanical) of cardiac electrode, initial encounter: Secondary | ICD-10-CM

## 2018-07-08 NOTE — Telephone Encounter (Signed)
See phone note opened 07/07/18.

## 2018-07-08 NOTE — Telephone Encounter (Signed)
Attempted to reach patient at home number and cell number. LMOM at home number, cell mailbox full so unable to LM.

## 2018-07-08 NOTE — Telephone Encounter (Addendum)
Able to reach patient. She reports she has heard the alert tone repeatedly since last night, denies any shocks or cardiac symptoms at this time. She is agreeable to appointment today in [place] at 11:00am at the [facility] office.

## 2018-07-08 NOTE — Telephone Encounter (Signed)
[name], when you get a chance to look at this I suspect it is a problem with her Riata lead.  It looked fluoroscopically okay when we did a generator change out 2 years ago.  She is not pacemaker dependent, but has been a CRT hyper responder. If a pacing issue, we may just be able to pace LV only. If it is a sensing issue, it will be harder. She has improved EF (50-55%) and therapies can be disabled if at risk for unnecessary shocks. If a HV issue, also less of an immediate concern. Please let me know. MCr

## 2018-07-08 NOTE — Progress Notes (Signed)
Device check in clinic after audible alarm last night and this morning. Device interrogation demonstrates intermittent noise on RV lead with TWOS on presenting rhythm. SIC counter >9000.  Lead parameters stable today. Discussed with Dr C, HV therapies turned off. RV sensitivity changed to 1.10m.  Dr C had an opening tomorrow on his schedule, follow up then to discuss plan for RV lead revision  AChanetta Marshall NP 07/08/2018 12:44 PM

## 2018-07-08 NOTE — Telephone Encounter (Signed)
Triage message received. Spoke with Dr.Croitoru. He is aware of the issue with the patients device. He received the message this morning. Raquel Sarna, RN will contact the patient to be seen in the device clinic today. Message routed to the device clinic

## 2018-07-08 NOTE — Patient Instructions (Signed)
Medication Instructions:  none If you need a refill on your cardiac medications before your next appointment, please call your pharmacy.   Lab work: none If you have labs (blood work) drawn today and your tests are completely normal, you will receive your results only by: [name] MyChart Message (if you have MyChart) OR . A paper copy in the mail If you have any lab test that is abnormal or we need to change your treatment, we will call you to review the results.  Testing/Procedures: none  Follow-Up: At [facility], you and your health needs are our priority.  As part of our continuing mission to provide you with exceptional heart care, we have created designated Provider Care Teams.  These Care Teams include your primary Cardiologist (physician) and Advanced Practice Providers (APPs -  Physician Assistants and Nurse Practitioners) who all work together to provide you with the care you need, when you need it. .   Any Other Special Instructions Will Be Listed Below (If Applicable).

## 2018-07-08 NOTE — Telephone Encounter (Signed)
Called patient back to advise that she not drive herself to the appointment. Patient verbalizes understanding, reports her husband drives her everywhere. She thanked me for my call and denies questions or concerns at this time.

## 2018-07-09 ENCOUNTER — Encounter: Payer: Self-pay | Admitting: Cardiovascular Disease

## 2018-07-09 ENCOUNTER — Ambulatory Visit: Payer: Medicare Other | Admitting: Cardiovascular Disease

## 2018-07-09 VITALS — BP 130/68 | HR 87 | Ht 62.0 in | Wt 112.2 lb

## 2018-07-09 DIAGNOSIS — T82110A Breakdown (mechanical) of cardiac electrode, initial encounter: Secondary | ICD-10-CM | POA: Diagnosis not present

## 2018-07-09 DIAGNOSIS — E782 Mixed hyperlipidemia: Secondary | ICD-10-CM

## 2018-07-09 DIAGNOSIS — I498 Other specified cardiac arrhythmias: Secondary | ICD-10-CM | POA: Diagnosis not present

## 2018-07-09 DIAGNOSIS — E441 Mild protein-calorie malnutrition: Secondary | ICD-10-CM

## 2018-07-09 DIAGNOSIS — I5032 Chronic diastolic (congestive) heart failure: Secondary | ICD-10-CM

## 2018-07-09 DIAGNOSIS — Z952 Presence of prosthetic heart valve: Secondary | ICD-10-CM | POA: Diagnosis not present

## 2018-07-09 DIAGNOSIS — N183 Chronic kidney disease, stage 3 unspecified: Secondary | ICD-10-CM

## 2018-07-09 DIAGNOSIS — Z7901 Long term (current) use of anticoagulants: Secondary | ICD-10-CM

## 2018-07-09 DIAGNOSIS — I480 Paroxysmal atrial fibrillation: Secondary | ICD-10-CM

## 2018-07-09 NOTE — Progress Notes (Signed)
Cardiology Office Note    Date:  07/09/2018   ID:  [name] 07-Jan-1941, MRN [id number]  PCP:  Leanna Battles, MD  Cardiologist:  Quay Burow, M.D.; Sanda Klein, MD   Chief Complaint  Patient presents with  CRT-D check    History of Present Illness:  [name] is a 78 y.o. female here for follow-up on her biventricular pacemaker/defibrillator  ([place], generator changeout in November 2018) and chronic heart failure.  Her device reported a lead impedance alert and alarmed.  She was seen in the device clinic yesterday and was found to have a out of range impedance on the pace-sense channel of the right ventricular lead.  There was also a lot of "noise" that thankfully did not reach a threshold to deliver tachycardia therapies.  There was evidence of oversensing.  Sensitivity was increased to 1.2 V and in the intervening 4 hours there has been no further "noise" recorded.  This was not able to be reproduced either yesterday or today with any types of movements or isometric maneuvers.  Today the RV impedance is normal at 494 ohms.  Sensed R waves are still excellent at greater than 20 mV and the pacing threshold is low at 0.75 V at 0.4 ms pulse width.  Ventricular tachycardia/fibrillation therapies were turned off to avoid unnecessary shocks.  Since CRT-D device implantation [name] has had remarkable improvement in LV function with an ejection fraction recently measured at 50-55%.  She has never had recorded ventricular tachycardia and has never received therapies from the device.  Ventricular pacing is currently fair at around 95%, not reaching 100% due to competition from an accelerated junctional rhythm that occurs off and on.  The impact of the junctional rhythm has diminished after we increased the lower rate limit to 70 bpm.  There has been no true atrial fibrillation.  She has atrial pacing roughly 70% of the time.  With her current settings  estimated generator longevity is about 8 years.  GI problems have improved and although she still has occasional uncontrollable vomiting, this is only happening about once a week now.  She has gained back some weight and her BMI is up to 20.5.  Her OptiVol does show some overshoot in early November and early January but is now back to baseline.  Was a lot of quick variation in thoracic impedance at that time probably related to episodes of vomiting and subsequent rehydration.  The heart rate histogram is a little blunted, in part because we have the lower rate limit set relatively fast to 70 bpm.  Denies any cardiovascular complaints. The patient specifically denies any chest pain at rest exertion, dyspnea at rest or with exertion, orthopnea, paroxysmal nocturnal dyspnea, syncope, palpitations, focal neurological deficits, intermittent claudication, lower extremity edema, unexplained weight gain, cough, hemoptysis or wheezing.  She is on chronic warfarin anticoagulation for mitral valve replacement with a mechanical prosthesis and paroxysmal atrial fibrillation.   Echo performed August 05, 2017 showed left ventricular ejection fraction of 50-55% and normal function of the mitral valve prosthesis, mean gradient 3 mm Hg.  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 Left ventricular ejection fraction which has been as low as 10% improved substantially after implantation of the CRT-D device and now her LVEF is near normal. She received her initial CRT-D in 2008 and underwent a generator  change out in 2012 and 2019. She has not received VT therapy. She has a Riata 7 [place] ICD lead under advisory, which appeared fluoroscopically normal at the time of last generator change and which still has normal parameters. Did not have any angiographic CAD prior to her surgery.     The atrial lead is a Medtronic [id number] serial number [id number] implanted in July 23, 2006 [name]) The right ventricular lead is a Chemical engineer 7002 serial number [id number] implanted same date The left ventricular lead is a Guidant BS CI 4543 LV lead, serial [id number] implanted same date She had redundant Medtronic left ventricular epicardial pacing leads implanted at the time of surgery in 2007.   Past Medical History:  Diagnosis Date  . Allergy    seasonal  . Anemia   . Anxiety   . Arthritis   . Asthma   . 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 Protecta  . Biventricular ICD (implantable cardioverter-defibrillator) in place    with CRT  . Blood transfusion without reported diagnosis   . Bursitis   . Cataract    RIGHT EYE  . CHF (congestive heart failure) ([place])   . Chronic kidney disease   . Colon polyp    adenomatous  . Complication of anesthesia    patient stated that had difficulty getting the breathing tube removed, patient said that she stopped breathing and HR dropped to 10  patient then woke up and started breathing pateint stated no longer than one minute; re-intubated in PACU following cholecystectomy 10/28/13  . COPD (chronic obstructive pulmonary disease) ([place])   . Depression   . Diverticulosis   . Dysrhythmia   . Fibromyalgia   . GERD (gastroesophageal reflux disease)   . Gout   . H/O mitral valve replacement 2002, 2007  . Heart murmur   . Hemorrhoids   . Hyperlipidemia   . Hypertension   . Hypothyroidism   . IBS (irritable bowel syndrome)   . MVP (mitral valve prolapse)   . Neuromuscular disorder (HCC)    fibromyalgia  . Pacemaker   . Peptic ulcer disease     Past Surgical History:  Procedure Laterality Date  . ABDOMINAL HYSTERECTOMY    . BIV ICD GENERATOR CHANGEOUT N/A 04/29/2017   Procedure: BIV ICD GENERATOR CHANGEOUT;  Surgeon: Sanda Klein, MD;  Location: [facility];  Service: Cardiovascular;   Laterality: N/A;  . CARDIAC CATHETERIZATION  02/25/2006   normal left main, normal LAD, normal L Cfx, normal/dominant RCA (Dr. [name])  . CARDIAC DEFIBRILLATOR PLACEMENT  2007, 11/202012   x2 (pacemaker) (Dr. Jerilynn Mages. Travontae Freiberger)  . CARDIAC VALVE REPLACEMENT  2002   MV repair - Dr. Berle Mull  . CHOLECYSTECTOMY N/A 10/28/2013   Procedure: LAPAROSCOPIC CHOLECYSTECTOMY WITH INTRAOPERATIVE CHOLANGIOGRAM;  Surgeon: Edward Jolly, MD;  Location: [facility];  Service: General;  Laterality: N/A;  . COLONOSCOPY    . EYE SURGERY    . IMPLANTABLE CARDIOVERTER DEFIBRILLATOR (ICD) GENERATOR CHANGE N/A 04/10/2011   Procedure: ICD GENERATOR CHANGE;  Surgeon: Sanda Klein, MD;  Location: [facility];  Service: Cardiovascular;  Laterality: N/A;  . KNEE ARTHROSCOPY    . MITRAL VALVE REPLACEMENT  02/26/2006   re-do MVR w/19m St. [name] (Dr. BEllison Hughs  . NM MYOCAR PERF WALL MOTION  2005   persantine myoview - low ris, EF 63%  . RIGHT HEART CATH  04/03/2006   pulm cap wedge pressure 24/24, PA pressure 43/22 (mean 383mg), CO  4.8, CI 4.1 (Dr. Jackie Plum)  . TOTAL SHOULDER ARTHROPLASTY Right 12/27/2015   Procedure: RIGHT TOTAL SHOULDER ARTHROPLASTY;  Surgeon: Tania Ade, MD;  Location: [facility];  Service: Orthopedics;  Laterality: Right;  Right total shoulder arthroplasty  . TRANSTHORACIC ECHOCARDIOGRAM  12/2011   EF 50-55%, mild global hypokinesis; LA severely dilated; calcification of anterior/posterior MV leaflets, bi-leaflets St. [name] mechanical MV; mild TR; trace AV regurg/pulm valve regurg    Current Medications: Outpatient Medications Prior to Visit  Medication Sig Dispense Refill  . allopurinol (ZYLOPRIM) 300 MG tablet Take 300 mg by mouth every evening.     [name] ALPRAZolam (XANAX) 0.25 MG tablet Take 0.25 mg by mouth 3 (three) times daily as needed for anxiety.    [name] amoxicillin (AMOXIL) 500 MG capsule TAKE 4 CAPSULES BY MOUTH 1 HOUR PRIOR TO DENTAL APPOINTMENT 12 capsule 1  . buPROPion (WELLBUTRIN) 100 MG  tablet Take 100 mg by mouth daily.  12  . enoxaparin (LOVENOX) 40 MG/0.4ML injection Inject 0.4 mLs (40 mg total) into the skin daily. (Patient taking differently: Inject 40 mg into the skin as needed. ) 20 Syringe 0  . esomeprazole (NEXIUM) 20 MG capsule Take 20 mg by mouth daily at 12 noon.    . furosemide (LASIX) 40 MG tablet Take 1 tablet (40 mg total) by mouth daily. Take extra 40 mg daily prn for weight gain 3lbs/day or 5 lbs/week or if you wake up with edema 90 tablet 9  . metoprolol succinate (TOPROL-XL) 25 MG 24 hr tablet Take 1.5 tablets (37.5 mg total) by mouth daily. 135 tablet 3  . pravastatin (PRAVACHOL) 40 MG tablet Take 40 mg at bedtime by mouth.     [name] PROAIR HFA 108 (90 BASE) MCG/ACT inhaler Inhale 1-2 puffs into the lungs every 6 (six) hours as needed for wheezing.     . Sennosides (SENNA) 15 MG TABS Take 1 tablet at bedtime by mouth.     . spironolactone (ALDACTONE) 25 MG tablet TAKE 1 TABLET (25 MG TOTAL) BY MOUTH DAILY. KEEP OV 90 tablet 3  . SYNTHROID 50 MCG tablet Take 50 mcg by mouth daily.  2  . warfarin (COUMADIN) 2.5 MG tablet Take 1 to 1 and 1/2 tablets by mouth daily as directed by coumadin clinic 135 tablet 1   Facility-Administered Medications Prior to Visit  Medication Dose Route Frequency Provider Last Rate Last Dose  . 0.9 %  sodium chloride infusion  500 mL Intravenous Continuous Irene Shipper, MD         Allergies:   Ace inhibitors; Lipitor [atorvastatin calcium]; and Codeine   Social History   Socioeconomic History  . Marital status: Married    Spouse name: [name]  . Number of children: 4  . Years of education: 23  . Highest education level: Not on file  Occupational History  . Occupation: retired  Scientific laboratory technician  . Financial resource strain: Not on file  . Food insecurity:    Worry: Not on file    Inability: Not on file  . Transportation needs:    Medical: Not on file    Non-medical: Not on file  Tobacco Use  . Smoking status: Former Smoker     Types: Cigarettes    Last attempt to quit: 06/02/2001    Years since quitting: 17.1  . Smokeless tobacco: Never Used  Substance and Sexual Activity  . Alcohol use: Yes    Alcohol/week: 0.0 standard drinks    Comment: social-1 every 6  months  . Drug use: No  . Sexual activity: Not on file  Lifestyle  . Physical activity:    Days per week: Not on file    Minutes per session: Not on file  . Stress: Not on file  Relationships  . Social connections:    Talks on phone: Not on file    Gets together: Not on file    Attends religious service: Not on file    Active member of club or organization: Not on file    Attends meetings of clubs or organizations: Not on file    Relationship status: Not on file  Other Topics Concern  . Not on file  Social History Narrative   Drinks very little caffeine      Family History:  The patient's family history includes Breast cancer in her sister; Crohn's disease in her sister; Heart attack in her father and mother; Heart disease in her father, mother, sister, and son; Kidney disease in her father; Uterine cancer in her sister.   ROS:   Please see the history of present illness.    ROS All other systems reviewed and are negative.   PHYSICAL EXAM:   VS:  BP 130/68   Pulse 87   Ht 5' 2"  (1.575 m)   Wt 112 lb 3.2 oz (50.9 kg)   BMI 20.52 kg/m     General: Alert, oriented x3, no distress, underweight Head: no evidence of trauma, PERRL, EOMI, no exophtalmos or lid lag, no myxedema, no xanthelasma; normal ears, nose and oropharynx Neck: normal jugular venous pulsations and no hepatojugular reflux; brisk carotid pulses without delay and no carotid bruits Chest:clear to auscultation, no signs of consolidation by percussion or palpation, normal fremitus, symmetrical and full respiratory excursions.  There is a lot of fat atrophy overlying the left sided subclavian pacemaker site, the skin itself appears healthy without evidence of adherence to the device or  inflammation Cardiovascular: Crisp mechanical valve clicks, RRR; no murmurs, rubs, or gallops,no edema. Abdomen: no tenderness or distention, no masses by palpation, no abnormal pulsatility or arterial bruits, normal bowel sounds, no hepatosplenomegaly Extremities: no clubbing, cyanosis or edema; 2+ radial, ulnar and brachial pulses bilaterally; 2+ right femoral, posterior tibial and dorsalis pedis pulses; 2+ left femoral, posterior tibial and dorsalis pedis pulses; no subclavian or femoral bruits Neurological: grossly nonfocal Psych: Normal mood and affect  Wt Readings from Last 3 Encounters:  07/09/18 112 lb 3.2 oz (50.9 kg)  07/08/18 111 lb (50.3 kg)  01/27/18 105 lb 3.2 oz (47.7 kg)    Studies/Labs Reviewed:   EKG:  EKG is not ordered today.   Her most recent tracing shows atrial paced, biventricular paced rhythm with a positive R waves in lead V1.    Recent Labs: No results found for requested labs within last 8760 hours.  December 28, 2017 Hemoglobin 11.3, creatinine 1.3, ALT 11 Cholesterol 247, HDL 35, LDL 124, triglycerides 442  ASSESSMENT:    1. Chronic diastolic (congestive) heart failure ([place])   2. Accelerated junctional rhythm   3. Failure of implantable cardioverter-defibrillator (ICD) lead, initial encounter   4. Hx of prosthetic mitral valve   5. Paroxysmal atrial fibrillation (HCC)   6. Long term (current) use of anticoagulants   7. Mild protein-calorie malnutrition ([place])   8. CKD (chronic kidney disease) stage 3, GFR 30-59 ml/min (HCC)   9. Mixed hyperlipidemia      PLAN:  In order of problems listed above:  1. CHF: Her activity level  is low, but she remains NYHA functional class I, euvolemic. Infrequently requires diuretic dose escalation.  Leanne Lovely has been stable in the last couple of months.  Echo in March 2019 with EF 50-55%. 2. Acc junctional rhythm: Still seen but less frequently now with a lower rate limit up to 70 bpm and the higher dose of  beta-blocker 3. CRT-D: Recent out of range impedance on the right ventricular lead is concerning for insulation breach, especially knowing that she has a Chemical engineer lead that is now 76 years old.  She has never required defibrillator therapies.  She is not pacemaker dependent.  EF has improved to almost normal range.  Tachycardia detection and therapies were turned off.  RV sensitivity was increased to 1.2 mV to screen out the "noise", so far with good results.  Native R waves are greater than 20 mV giving [place] a large "buffer".  She is a very challenging candidate for lead extraction.  She is on chronic anticoagulation for mechanical mitral valve and has very small body habitus.  She has had numerous previous surgical interventions for valvular heart disease.  I would be extremely reluctant to recommend lead extraction.  Continue to monitor the device (LIA turned on with alerts sent via [facility] to the device clinic, downgraded to "yellow alert" to avoid unnecessary distress to the patient with alarms).  If sensing continues to deteriorate, rather than placing a new lead, it might be worthwhile trying to find the retained epicardial LV leads that were placed at her last mitral valve surgery, but never used.  We could use the bipolar coronary sinus lead in the RV port and then place 1 of the epicardial leads in the LV port, for example.  If the left subclavian vein is still patent, another option is simply to place another RV pace-sense pacemaker lead next to the defibrillator lead. 4. S/P MVR mechanical prosthesis: Normal function of the prosthesis by her recent echo. 5. AFib: Despite being off all antiarrhythmics for over a year, she has not had any recurrence. 6. Warfarin: Whenever she has GI problems, anticoagulation becomes rather volatile.   7. Malnutrition: Weight is improving with a reduction in the episodes of intractable vomiting 8. CKD 3: Creatinine clearance around 35-40, most recent serum  creatinine 1.30 in July. 9. HLP: No known coronary vascular disease by previous angiography and ultrasonography.  She is taking numerous medications and was intolerant to other statins.  Continue pravastatin.  Labs routinely checked in Dr. Shon Baton office.  Medication Adjustments/Labs and Tests Ordered: Current medicines are reviewed at length with the patient today.  Concerns regarding medicines are outlined above.  Medication changes, Labs and Tests ordered today are listed in the Patient Instructions below. Patient Instructions  Medication Instructions:  The current medical regimen is effective;  continue present plan and medications.  If you need a refill on your cardiac medications before your next appointment, please call your pharmacy.    Follow-Up: At [facility], you and your health needs are our priority.  As part of our continuing mission to provide you with exceptional heart care, we have created designated Provider Care Teams.  These Care Teams include your primary Cardiologist (physician) and Advanced Practice Providers (APPs -  Physician Assistants and Nurse Practitioners) who all work together to provide you with the care you need, when you need it. You will need a follow up appointment in 3 months.  Please call our office 2 months in advance to schedule this appointment.  You may see Sanda Klein, MD or one of the following Advanced Practice Providers on your designated Care Team: [place], [place] . Fabian Sharp, PA-C       Signed, Sanda Klein, MD  07/09/2018 1:07 PM    [facility] [address], [place], [place]  [address] Phone: [phone]; Fax: [phone]

## 2018-07-09 NOTE — Patient Instructions (Signed)
Medication Instructions:  The current medical regimen is effective;  continue present plan and medications.  If you need a refill on your cardiac medications before your next appointment, please call your pharmacy.    Follow-Up: At [facility], you and your health needs are our priority.  As part of our continuing mission to provide you with exceptional heart care, we have created designated Provider Care Teams.  These Care Teams include your primary Cardiologist (physician) and Advanced Practice Providers (APPs -  Physician Assistants and Nurse Practitioners) who all work together to provide you with the care you need, when you need it. You will need a follow up appointment in 3 months.  Please call our office 2 months in advance to schedule this appointment.  You may see Sanda Klein, MD or one of the following Advanced Practice Providers on your designated Care Team: [place], [place] . Fabian Sharp, PA-C

## 2018-07-11 ENCOUNTER — Other Ambulatory Visit (HOSPITAL_COMMUNITY): Payer: Self-pay | Admitting: Physician Assistant

## 2018-07-20 LAB — CUP PACEART INCLINIC DEVICE CHECK
Implantable Lead Implant Date: 20080221
Implantable Lead Model: 5076
Implantable Lead Model: 7002
MDC IDC LEAD IMPLANT DT: 20080221
MDC IDC LEAD IMPLANT DT: 20080221
MDC IDC LEAD LOCATION: 753858
MDC IDC LEAD LOCATION: 753859
MDC IDC LEAD LOCATION: 753860
MDC IDC LEAD SERIAL: 134379
MDC IDC PG IMPLANT DT: 20181128
MDC IDC SESS DTM: 20200218144446

## 2018-07-28 ENCOUNTER — Ambulatory Visit: Payer: Medicare Other | Admitting: Cardiovascular Disease

## 2018-07-28 ENCOUNTER — Ambulatory Visit (INDEPENDENT_AMBULATORY_CARE_PROVIDER_SITE_OTHER): Payer: Medicare Other | Admitting: Pharmacist Clinician (PhC)/ Clinical Pharmacy Specialist

## 2018-07-28 ENCOUNTER — Encounter: Payer: Self-pay | Admitting: Cardiovascular Disease

## 2018-07-28 ENCOUNTER — Ambulatory Visit (INDEPENDENT_AMBULATORY_CARE_PROVIDER_SITE_OTHER): Payer: Medicare Other | Admitting: *Deleted

## 2018-07-28 VITALS — BP 130/60 | HR 83 | Ht 62.0 in | Wt 112.4 lb

## 2018-07-28 DIAGNOSIS — I341 Nonrheumatic mitral (valve) prolapse: Secondary | ICD-10-CM | POA: Diagnosis not present

## 2018-07-28 DIAGNOSIS — I10 Essential (primary) hypertension: Secondary | ICD-10-CM | POA: Diagnosis not present

## 2018-07-28 DIAGNOSIS — Z95810 Presence of automatic (implantable) cardiac defibrillator: Secondary | ICD-10-CM

## 2018-07-28 DIAGNOSIS — Z952 Presence of prosthetic heart valve: Secondary | ICD-10-CM

## 2018-07-28 DIAGNOSIS — E782 Mixed hyperlipidemia: Secondary | ICD-10-CM | POA: Diagnosis not present

## 2018-07-28 DIAGNOSIS — Z7901 Long term (current) use of anticoagulants: Secondary | ICD-10-CM | POA: Diagnosis not present

## 2018-07-28 DIAGNOSIS — I480 Paroxysmal atrial fibrillation: Secondary | ICD-10-CM

## 2018-07-28 DIAGNOSIS — I5032 Chronic diastolic (congestive) heart failure: Secondary | ICD-10-CM | POA: Diagnosis not present

## 2018-07-28 LAB — POCT INR: INR: 4 — AB (ref 2.0–3.0)

## 2018-07-28 NOTE — Assessment & Plan Note (Signed)
Biventricular/CRT pacemaker with normalization of her EF from 10% up to 50 to 55%.  She is class I.

## 2018-07-28 NOTE — Assessment & Plan Note (Signed)
History of mitral valve replacement by Dr. Orvilla Cornwall in 2002 with redo by Dr. Arvid Right 2007 with a [place] MVR.  She is on Coumadin anticoagulation.  Her most recent 2D echo performed 08/05/2017 revealed an EF of 50 to 55% with a well-functioning aortic mechanical prosthesis.

## 2018-07-28 NOTE — Assessment & Plan Note (Signed)
History of hyperlipidemia on pravastatin with lipid profile performed 12/28/2017 revealing total cholesterol 247, LDL 124 and HDL of 35.  Her triglyceride level was 442.  She was told by her PCP to liberalize her diet because of malnutrition and weight loss.  We will recheck a lipid liver profile today.

## 2018-07-28 NOTE — Assessment & Plan Note (Signed)
History of PAF in the past with nondetected with remote monitoring on Coumadin anticoagulation.

## 2018-07-28 NOTE — Assessment & Plan Note (Signed)
The ICD portion of her by V ICD was turned off because of normalization of her EF.

## 2018-07-28 NOTE — Progress Notes (Signed)
07/28/2018 [name]   [birth year]  [id number]  Primary Physician Leanna Battles, MD Primary Cardiologist: Lorretta Harp MD Garret Reddish, [place], [place]  HPI:  [name] is a 78 y.o.  thin appearing, married, Caucasian female, mother of three (one deceased child), grandmother to eight grandchildren, who I last sawin the office  07/22/2017. Her husband is also a patient of mine. He had stage IV lung cancer, which responded to chemotherapy. 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 Berle Mull in 2002 with redo with a [name] MVR performed by Dr. Gilford Raid in 2007. She had normal coronary arteries. Her EF improved initially from 10% to normal with cardiac resynchronization therapy. Dr. Sallyanne Kuster follows this. She has undergone a generator change by Dr. Sallyanne Kuster on April 22, 2011, and she is followed by [facility]. Recent 2-D echocardiogram performed 9/7/17revealed normal ejection fraction with a normal functioning mitral mechanical prosthesis.She denies chest pain or shortness of breath.  She had a bi-V generator change by Dr. Sallyanne Kuster 04/28/17 after having a Lovenox bridge for her mitral valve. She is otherwise asymptomatic with her most recent 2-D echo performed 08/05/2017 revealing an EF of 50 to 55% with a well-functioning mitral mechanical prosthesis.  Since I saw her a year ago she is remained asymptomatic specifically denying chest pain or shortness of breath.    Current Meds  Medication Sig  . allopurinol (ZYLOPRIM) 300 MG tablet Take 300 mg by mouth every evening.   [name] ALPRAZolam (XANAX) 0.25 MG tablet Take 0.25 mg by mouth 3 (three) times daily as needed for anxiety.  [name] amoxicillin (AMOXIL) 500 MG capsule TAKE 4 CAPSULES BY MOUTH 1 HOUR PRIOR TO DENTAL APPOINTMENT  . buPROPion (WELLBUTRIN) 100 MG tablet Take 100 mg by mouth daily.  [name] enoxaparin (LOVENOX) 40 MG/0.4ML injection Inject 0.4 mLs (40 mg  total) into the skin daily. (Patient taking differently: Inject 40 mg into the skin as needed. )  . esomeprazole (NEXIUM) 20 MG capsule Take 20 mg by mouth daily at 12 noon.  . furosemide (LASIX) 40 MG tablet 1 DAILY.TAKE EXTRA TAB DAILY AS NEED FOR WEIGHT GAIN OF 3 LB/DAY OR 5 LB/WK OR AWAKE WITH EDEMA  . metoprolol succinate (TOPROL-XL) 25 MG 24 hr tablet Take 1.5 tablets (37.5 mg total) by mouth daily.  . pravastatin (PRAVACHOL) 40 MG tablet Take 40 mg at bedtime by mouth.   [name] PROAIR HFA 108 (90 BASE) MCG/ACT inhaler Inhale 1-2 puffs into the lungs every 6 (six) hours as needed for wheezing.   . Sennosides (SENNA) 15 MG TABS Take 1 tablet at bedtime by mouth.   . spironolactone (ALDACTONE) 25 MG tablet TAKE 1 TABLET (25 MG TOTAL) BY MOUTH DAILY. KEEP OV  . SYNTHROID 50 MCG tablet Take 50 mcg by mouth daily.  [name] warfarin (COUMADIN) 2.5 MG tablet Take 1 to 1 and 1/2 tablets by mouth daily as directed by coumadin clinic   Current Facility-Administered Medications for the 07/28/18 encounter (Office Visit) with Lorretta Harp, MD  Medication  . 0.9 %  sodium chloride infusion     Allergies  Allergen Reactions  . Ace Inhibitors Cough  . Lipitor [Atorvastatin Calcium] Other (See Comments)    Knee pain.  . Codeine Nausea And Vomiting    Social History   Socioeconomic History  . Marital status: Married    Spouse name: [name]  . Number of children: 4  . Years  of education: 43  . Highest education level: Not on file  Occupational History  . Occupation: retired  Scientific laboratory technician  . Financial resource strain: Not on file  . Food insecurity:    Worry: Not on file    Inability: Not on file  . Transportation needs:    Medical: Not on file    Non-medical: Not on file  Tobacco Use  . Smoking status: Former Smoker    Types: Cigarettes    Last attempt to quit: 06/02/2001    Years since quitting: 17.1  . Smokeless tobacco: Never Used  Substance and Sexual Activity  . Alcohol use: Yes     Alcohol/week: 0.0 standard drinks    Comment: social-1 every 6 months  . Drug use: No  . Sexual activity: Not on file  Lifestyle  . Physical activity:    Days per week: Not on file    Minutes per session: Not on file  . Stress: Not on file  Relationships  . Social connections:    Talks on phone: Not on file    Gets together: Not on file    Attends religious service: Not on file    Active member of club or organization: Not on file    Attends meetings of clubs or organizations: Not on file    Relationship status: Not on file  . Intimate partner violence:    Fear of current or ex partner: Not on file    Emotionally abused: Not on file    Physically abused: Not on file    Forced sexual activity: Not on file  Other Topics Concern  . Not on file  Social History Narrative   Drinks very little caffeine      Review of Systems: General: negative for chills, fever, night sweats or weight changes.  Cardiovascular: negative for chest pain, dyspnea on exertion, edema, orthopnea, palpitations, paroxysmal nocturnal dyspnea or shortness of breath Dermatological: negative for rash Respiratory: negative for cough or wheezing Urologic: negative for hematuria Abdominal: negative for nausea, vomiting, diarrhea, bright red blood per rectum, melena, or hematemesis Neurologic: negative for visual changes, syncope, or dizziness All other systems reviewed and are otherwise negative except as noted above.    Blood pressure 130/60, pulse 83, height 5' 2"  (1.575 m), weight 112 lb 6.4 oz (51 kg).  General appearance: alert and no distress Neck: no adenopathy, no carotid bruit, no JVD, supple, symmetrical, trachea midline and thyroid not enlarged, symmetric, no tenderness/mass/nodules Lungs: clear to auscultation bilaterally Heart: Crisp prosthetic mitral valve sound Extremities: extremities normal, atraumatic, no cyanosis or edema Pulses: 2+ and symmetric Skin: Skin color, texture, turgor normal. No  rashes or lesions Neurologic: Alert and oriented X 3, normal strength and tone. Normal symmetric reflexes. Normal coordination and gait  EKG AV sequentially paced rhythm at 83.  I personally reviewed this EKG.  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 Orvilla Cornwall in 2002 with redo by Dr. Arvid Right 2007 with a [place] MVR.  She is on Coumadin anticoagulation.  Her most recent 2D echo performed 08/05/2017 revealed an EF of 50 to 55% with a well-functioning aortic mechanical prosthesis.  HYPERLIPIDEMIA History of hyperlipidemia on pravastatin with lipid profile performed 12/28/2017 revealing total cholesterol 247, LDL 124 and HDL of 35.  Her triglyceride level was 442.  She was told by her PCP to liberalize her diet because of malnutrition and weight loss.  We  will recheck a lipid liver profile today.  Automatic implantable cardioverter-defibrillator in situ The ICD portion of her by V ICD was turned off because of normalization of her EF.  Biventricular ICD (implantable cardioverter-defibrillator) in place Biventricular/CRT pacemaker with normalization of her EF from 10% up to 50 to 55%.  She is class I.  Paroxysmal atrial fibrillation (HCC) History of PAF in the past with nondetected with remote monitoring on Coumadin anticoagulation.      Lorretta Harp MD FACP,FACC,FAHA, [facility] 07/28/2018 11:04 AM

## 2018-07-28 NOTE — Patient Instructions (Signed)
Medication Instructions:  Your physician recommends that you continue on your current medications as directed. Please refer to the Current Medication list given to you today.  If you need a refill on your cardiac medications before your next appointment, please call your pharmacy.   Lab work: Your physician recommends that you return for lab work TODAY: LIPID AND LIVER PANELS  If you have labs (blood work) drawn today and your tests are completely normal, you will receive your results only by: [name] MyChart Message (if you have MyChart) OR . A paper copy in the mail If you have any lab test that is abnormal or we need to change your treatment, we will call you to review the results.  Testing/Procedures: Your physician has requested that you have an echocardiogram. Echocardiography is a painless test that uses sound waves to create images of your heart. It provides your doctor with information about the size and shape of your heart and how well your heart's chambers and valves are working. This procedure takes approximately one hour. There are no restrictions for this procedure. SCHEDULE MARCH 2020; ANNUAL STUDY   Follow-Up: At [facility], you and your health needs are our priority.  As part of our continuing mission to provide you with exceptional heart care, we have created designated Provider Care Teams.  These Care Teams include your primary Cardiologist (physician) and Advanced Practice Providers (APPs -  Physician Assistants and Nurse Practitioners) who all work together to provide you with the care you need, when you need it. . You will need a follow up appointment in 12 months.  Please call our office 2 months in advance to schedule this appointment.  You may see Dr. Gwenlyn Found or one of the following Advanced Practice Providers on your designated Care Team:   . Kerin Ransom, [place] . Almyra Deforest, PA-C . Fabian Sharp, PA-C . Jory Sims, DNP . Rosaria Ferries, PA-C . Roby Lofts,  PA-C . Sande Rives, PA-C

## 2018-07-29 LAB — CUP PACEART REMOTE DEVICE CHECK
Battery Voltage: 3.01 V
Brady Statistic AP VP Percent: 75.12 %
Brady Statistic AP VS Percent: 2.4 %
Brady Statistic AS VP Percent: 21.17 %
Brady Statistic AS VS Percent: 1.32 %
Brady Statistic RA Percent Paced: 70.1 %
Brady Statistic RV Percent Paced: 11.92 %
Date Time Interrogation Session: 20200226083523
HighPow Impedance: 93 Ohm
Implantable Lead Implant Date: 20080221
Implantable Lead Implant Date: 20080221
Implantable Lead Implant Date: 20080221
Implantable Lead Location: 753858
Implantable Lead Location: 753859
Implantable Lead Location: 753860
Implantable Lead Model: 4543
Implantable Lead Model: 5076
Implantable Lead Model: 7002
Implantable Lead Serial Number: 134379
Lead Channel Impedance Value: 323 Ohm
Lead Channel Impedance Value: 380 Ohm
Lead Channel Impedance Value: 494 Ohm
Lead Channel Impedance Value: 494 Ohm
Lead Channel Impedance Value: 817 Ohm
Lead Channel Pacing Threshold Amplitude: 0.75 V
Lead Channel Pacing Threshold Amplitude: 0.75 V
Lead Channel Pacing Threshold Amplitude: 1 V
Lead Channel Pacing Threshold Pulse Width: 0.4 ms
Lead Channel Pacing Threshold Pulse Width: 0.4 ms
Lead Channel Pacing Threshold Pulse Width: 0.4 ms
Lead Channel Sensing Intrinsic Amplitude: 0.625 mV
Lead Channel Sensing Intrinsic Amplitude: 0.625 mV
Lead Channel Sensing Intrinsic Amplitude: 27.625 mV
Lead Channel Sensing Intrinsic Amplitude: 27.625 mV
Lead Channel Setting Pacing Amplitude: 2 V
Lead Channel Setting Pacing Pulse Width: 0.4 ms
Lead Channel Setting Pacing Pulse Width: 0.4 ms
Lead Channel Setting Sensing Sensitivity: 1.2 mV
MDC IDC MSMT BATTERY REMAINING LONGEVITY: 98 mo
MDC IDC MSMT LEADCHNL LV IMPEDANCE VALUE: 437 Ohm
MDC IDC PG IMPLANT DT: 20181128
MDC IDC SET LEADCHNL LV PACING AMPLITUDE: 1.5 V
MDC IDC SET LEADCHNL RA PACING AMPLITUDE: 1.5 V

## 2018-07-29 LAB — LIPID PANEL
Chol/HDL Ratio: 8.1 ratio — ABNORMAL HIGH (ref 0.0–4.4)
Cholesterol, Total: 218 mg/dL — ABNORMAL HIGH (ref 100–199)
HDL: 27 mg/dL — ABNORMAL LOW (ref >39)
Triglycerides: 536 mg/dL — ABNORMAL HIGH (ref 0–149)

## 2018-07-29 LAB — HEPATIC FUNCTION PANEL
ALT: 13 IU/L (ref 0–32)
AST: 24 IU/L (ref 0–40)
Albumin: 4.3 g/dL (ref 3.7–4.7)
Alkaline Phosphatase: 112 IU/L (ref 39–117)
Bilirubin Total: 0.4 mg/dL (ref 0.0–1.2)
Bilirubin, Direct: 0.12 mg/dL (ref 0.00–0.40)
Total Protein: 7 g/dL (ref 6.0–8.5)

## 2018-08-04 ENCOUNTER — Other Ambulatory Visit: Payer: Self-pay

## 2018-08-04 ENCOUNTER — Encounter: Payer: Self-pay | Admitting: Cardiology

## 2018-08-04 MED ORDER — ICOSAPENT ETHYL 1 G PO CAPS: 1.0000 | ORAL_CAPSULE | Freq: Two times a day (BID) | ORAL | 12 refills | Status: DC

## 2018-08-04 NOTE — Progress Notes (Signed)
Remote ICD transmission.   

## 2018-08-10 ENCOUNTER — Ambulatory Visit (HOSPITAL_COMMUNITY): Payer: Medicare Other | Attending: Cardiovascular Disease

## 2018-08-10 DIAGNOSIS — I341 Nonrheumatic mitral (valve) prolapse: Secondary | ICD-10-CM | POA: Diagnosis present

## 2018-08-19 ENCOUNTER — Other Ambulatory Visit: Payer: Self-pay

## 2018-08-19 ENCOUNTER — Other Ambulatory Visit: Payer: Self-pay | Admitting: Cardiovascular Disease

## 2018-08-19 DIAGNOSIS — I341 Nonrheumatic mitral (valve) prolapse: Secondary | ICD-10-CM

## 2018-08-19 DIAGNOSIS — Z952 Presence of prosthetic heart valve: Secondary | ICD-10-CM

## 2018-08-25 ENCOUNTER — Telehealth: Payer: Self-pay

## 2018-08-25 NOTE — Telephone Encounter (Signed)

## 2018-08-27 ENCOUNTER — Ambulatory Visit (INDEPENDENT_AMBULATORY_CARE_PROVIDER_SITE_OTHER): Payer: Medicare Other | Admitting: Pharmacist

## 2018-08-27 DIAGNOSIS — Z7901 Long term (current) use of anticoagulants: Secondary | ICD-10-CM | POA: Diagnosis not present

## 2018-08-27 DIAGNOSIS — Z952 Presence of prosthetic heart valve: Secondary | ICD-10-CM | POA: Diagnosis not present

## 2018-08-27 LAB — POCT INR: INR: 3.4 — AB (ref 2.0–3.0)

## 2018-09-22 ENCOUNTER — Telehealth: Payer: Self-pay

## 2018-09-22 NOTE — Telephone Encounter (Signed)

## 2018-09-24 ENCOUNTER — Other Ambulatory Visit: Payer: Self-pay

## 2018-09-24 ENCOUNTER — Ambulatory Visit (INDEPENDENT_AMBULATORY_CARE_PROVIDER_SITE_OTHER): Payer: Medicare Other | Admitting: Pharmacist Clinician (PhC)/ Clinical Pharmacy Specialist

## 2018-09-24 DIAGNOSIS — Z952 Presence of prosthetic heart valve: Secondary | ICD-10-CM

## 2018-09-24 DIAGNOSIS — Z7901 Long term (current) use of anticoagulants: Secondary | ICD-10-CM

## 2018-09-24 LAB — POCT INR: INR: 5.4 — AB (ref 2.0–3.0)

## 2018-10-08 ENCOUNTER — Ambulatory Visit (INDEPENDENT_AMBULATORY_CARE_PROVIDER_SITE_OTHER): Payer: Medicare Other | Admitting: Pharmacist

## 2018-10-08 ENCOUNTER — Other Ambulatory Visit: Payer: Self-pay

## 2018-10-08 DIAGNOSIS — Z7901 Long term (current) use of anticoagulants: Secondary | ICD-10-CM

## 2018-10-08 DIAGNOSIS — Z952 Presence of prosthetic heart valve: Secondary | ICD-10-CM

## 2018-10-08 LAB — POCT INR: INR: 3.7 — AB (ref 2.0–3.0)

## 2018-10-12 ENCOUNTER — Telehealth: Payer: Self-pay | Admitting: Cardiovascular Disease

## 2018-10-12 NOTE — Telephone Encounter (Signed)
Smartphone/ verbal consent/ my chart via text/ pre reg completed

## 2018-10-13 ENCOUNTER — Telehealth: Payer: Self-pay | Admitting: Cardiovascular Disease

## 2018-10-13 ENCOUNTER — Telehealth: Payer: Self-pay

## 2018-10-13 ENCOUNTER — Telehealth (INDEPENDENT_AMBULATORY_CARE_PROVIDER_SITE_OTHER): Payer: Medicare Other | Admitting: Cardiovascular Disease

## 2018-10-13 VITALS — BP 146/64 | HR 86 | Wt 113.5 lb

## 2018-10-13 DIAGNOSIS — I10 Essential (primary) hypertension: Secondary | ICD-10-CM

## 2018-10-13 DIAGNOSIS — E441 Mild protein-calorie malnutrition: Secondary | ICD-10-CM

## 2018-10-13 DIAGNOSIS — Z7901 Long term (current) use of anticoagulants: Secondary | ICD-10-CM

## 2018-10-13 DIAGNOSIS — I5032 Chronic diastolic (congestive) heart failure: Secondary | ICD-10-CM | POA: Diagnosis not present

## 2018-10-13 DIAGNOSIS — Z952 Presence of prosthetic heart valve: Secondary | ICD-10-CM

## 2018-10-13 DIAGNOSIS — Z950 Presence of cardiac pacemaker: Secondary | ICD-10-CM | POA: Diagnosis not present

## 2018-10-13 DIAGNOSIS — N183 Chronic kidney disease, stage 3 unspecified: Secondary | ICD-10-CM

## 2018-10-13 DIAGNOSIS — E782 Mixed hyperlipidemia: Secondary | ICD-10-CM

## 2018-10-13 DIAGNOSIS — I480 Paroxysmal atrial fibrillation: Secondary | ICD-10-CM

## 2018-10-13 DIAGNOSIS — I498 Other specified cardiac arrhythmias: Secondary | ICD-10-CM

## 2018-10-13 DIAGNOSIS — Z95810 Presence of automatic (implantable) cardiac defibrillator: Secondary | ICD-10-CM

## 2018-10-13 NOTE — Patient Instructions (Signed)
Medication Instructions:  Your physician recommends that you continue on your current medications as directed. Please refer to the Current Medication list given to you today. If you need a refill on your cardiac medications before your next appointment, please call your pharmacy.   Lab work: None  If you have labs (blood work) drawn today and your tests are completely normal, you will receive your results only by: [name] MyChart Message (if you have MyChart) OR . A paper copy in the mail If you have any lab test that is abnormal or we need to change your treatment, we will call you to review the results.  Testing/Procedures: None   Follow-Up: At [facility], you and your health needs are our priority.  As part of our continuing mission to provide you with exceptional heart care, we have created designated Provider Care Teams.  These Care Teams include your primary Cardiologist (physician) and Advanced Practice Providers (APPs -  Physician Assistants and Nurse Practitioners) who all work together to provide you with the care you need, when you need it. You will need a follow up appointment in 12 months.  Please call our office 2 months in advance to schedule this appointment.  You may see Sanda Klein, MD or one of the following Advanced Practice Providers on your designated Care Team: [place], [place] . Fabian Sharp, PA-C  Any Other Special Instructions Will Be Listed Below (If Applicable).

## 2018-10-13 NOTE — Telephone Encounter (Signed)
New Message      Pt got disconnected from call   Please call back

## 2018-10-13 NOTE — Telephone Encounter (Signed)
Contacted patient to discuss AVS instructions. Patient agreeable with Dr Victorino December recommendations and voiced understanding.

## 2018-10-13 NOTE — Progress Notes (Signed)
Virtual Visit via Video Note   This visit type was conducted due to national recommendations for restrictions regarding the COVID-19 Pandemic (e.g. social distancing) in an effort to limit this patient's exposure and mitigate transmission in our community.  Due to her co-morbid illnesses, this patient is at least at moderate risk for complications without adequate follow up.  This format is felt to be most appropriate for this patient at this time.  All issues noted in this document were discussed and addressed.  A limited physical exam was performed with this format.  Please refer to the patient's chart for her consent to telehealth for [facility].   Failed attempts to establish video link  Date:  10/13/2018   ID:  [name], DOB 03/10/1941, MRN [id number]  Patient Location: Home Provider Location: Home  PCP:  Leanna Battles, MD  Cardiologist: Quay Burow, MD; Sanda Klein, MD  Electrophysiologist:  None   Evaluation Performed:  Follow-Up Visit  Chief Complaint:  ICD follow up  History of Present Illness:    [name] is a 78 y.o. female with  mechanical mitral valve replacement (repair 2002, replacement 2007,  biventricular pacemaker/defibrillator  (Medtronic [place], generator changeout in November 2018) and chronic heart failure.  In early 2020 she began developing" noise" on the right ventricular pace-sense channel.  She has recovered left ventricular systolic function to an ejection fraction of 50-55% and she has never received tachycardia therapies.  Ventricular sensing was increased to 1.2 mV to avoid unnecessary therapy.  She underwent a repeat echocardiogram in March 2020 to confirm that she has preserved left ventricular systolic function and reevaluate the gradients across the mitral prosthesis.  EF remains 50-55% and mitral valve parameters remain normal.  She is here today for pacemaker follow-up.  She has no cardiovascular complaints.  She has  not had defibrillator discharges or palpitations.  The patient specifically denies any chest pain at rest exertion, dyspnea at rest or with exertion, orthopnea, paroxysmal nocturnal dyspnea, syncope, palpitations, focal neurological deficits, intermittent claudication, lower extremity edema, unexplained weight gain, cough, hemoptysis or wheezing.  For years she has been plagued by recurrent episodes of intractable vomiting, which she has discovered are related to unpleasant phone interactions with her son.  Her son has problems with addiction and has been in prison and is often verbally abusive.  She is raising her grandson, since his father cannot care for him and his mother overdosed.  Unfortunately, [name] lost another son to addiction as well.   Had an appointment with Dr. Gwenlyn Found in February and an echo in March - reviewed.   [name] has a long-standing history of valvular heart disease, undergoing mitral valve repair in 2002, followed by a mitral valve replacement with a mechanical prosthesis in 2007. Left ventricular ejection fraction which has been as low as 10% improved substantially after implantation of the CRT-D device and now her LVEF is near normal. She received her initial CRT-D in 2008 and underwent a generator change out in 2012 and 2019. She has not received VT therapy. She has a Riata 7 [place] ICD lead under advisory, which appeared fluoroscopically normal at the time of last generator change.  Did not have any angiographic CAD prior to her surgery.    The atrial lead is a Medtronic [id number] serial number [id number] implanted in July 23, 2006 [name]) The right ventricular lead is a Chemical engineer 7002 serial number [id number] implanted same date The left ventricular lead  is a Guidant BS CI 4543 LV lead, serial [id number] implanted same date She had redundant Medtronic left ventricular epicardial pacing leads implanted at the time of surgery in 2007.   The patient does not  have symptoms concerning for COVID-19 infection (fever, chills, cough, or new shortness of breath).    Past Medical History:  Diagnosis Date  . Allergy    seasonal  . Anemia   . Anxiety   . Arthritis   . Asthma   . 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 Protecta  . Biventricular ICD (implantable cardioverter-defibrillator) in place    with CRT  . Blood transfusion without reported diagnosis   . Bursitis   . Cataract    RIGHT EYE  . CHF (congestive heart failure) ([place])   . Chronic kidney disease   . Colon polyp    adenomatous  . Complication of anesthesia    patient stated that had difficulty getting the breathing tube removed, patient said that she stopped breathing and HR dropped to 10  patient then woke up and started breathing pateint stated no longer than one minute; re-intubated in PACU following cholecystectomy 10/28/13  . COPD (chronic obstructive pulmonary disease) ([place])   . Depression   . Diverticulosis   . Dysrhythmia   . Fibromyalgia   . GERD (gastroesophageal reflux disease)   . Gout   . H/O mitral valve replacement 2002, 2007  . Heart murmur   . Hemorrhoids   . Hyperlipidemia   . Hypertension   . Hypothyroidism   . IBS (irritable bowel syndrome)   . MVP (mitral valve prolapse)   . Neuromuscular disorder (HCC)    fibromyalgia  . Pacemaker   . Peptic ulcer disease    Past Surgical History:  Procedure Laterality Date  . ABDOMINAL HYSTERECTOMY    . BIV ICD GENERATOR CHANGEOUT N/A 04/29/2017   Procedure: BIV ICD GENERATOR CHANGEOUT;  Surgeon: Sanda Klein, MD;  Location: [facility];  Service: Cardiovascular;  Laterality: N/A;  . CARDIAC CATHETERIZATION  02/25/2006   normal left main, normal LAD, normal L Cfx, normal/dominant RCA (Dr. [name])  . CARDIAC DEFIBRILLATOR PLACEMENT  2007, 11/202012   x2 (pacemaker) (Dr. Jerilynn Mages. Marialy Urbanczyk)  . CARDIAC VALVE REPLACEMENT  2002   MV repair - Dr. Berle Mull  . CHOLECYSTECTOMY  N/A 10/28/2013   Procedure: LAPAROSCOPIC CHOLECYSTECTOMY WITH INTRAOPERATIVE CHOLANGIOGRAM;  Surgeon: Edward Jolly, MD;  Location: [facility];  Service: General;  Laterality: N/A;  . COLONOSCOPY    . EYE SURGERY    . IMPLANTABLE CARDIOVERTER DEFIBRILLATOR (ICD) GENERATOR CHANGE N/A 04/10/2011   Procedure: ICD GENERATOR CHANGE;  Surgeon: Sanda Klein, MD;  Location: [facility];  Service: Cardiovascular;  Laterality: N/A;  . KNEE ARTHROSCOPY    . MITRAL VALVE REPLACEMENT  02/26/2006   re-do MVR w/18m St. [name] (Dr. BEllison Hughs  . NM MYOCAR PERF WALL MOTION  2005   persantine myoview - low ris, EF 63%  . RIGHT HEART CATH  04/03/2006   pulm cap wedge pressure 24/24, PA pressure 43/22 (mean 339mg), CO 4.8, CI 4.1 (Dr. J.Jackie Plum . TOTAL SHOULDER ARTHROPLASTY Right 12/27/2015   Procedure: RIGHT TOTAL SHOULDER ARTHROPLASTY;  Surgeon: JuTania AdeMD;  Location: [facility] Service: Orthopedics;  Laterality: Right;  Right total shoulder arthroplasty  . TRANSTHORACIC ECHOCARDIOGRAM  12/2011   EF 50-55%, mild global hypokinesis; LA severely dilated; calcification of anterior/posterior MV leaflets, bi-leaflets St. [name] mechanical MV; mild TR; trace AV  regurg/pulm valve regurg     Current Meds  Medication Sig  . allopurinol (ZYLOPRIM) 300 MG tablet Take 300 mg by mouth every evening.   [name] ALPRAZolam (XANAX) 0.25 MG tablet Take 0.25 mg by mouth 3 (three) times daily as needed for anxiety.  [name] amoxicillin (AMOXIL) 500 MG capsule TAKE 4 CAPSULES BY MOUTH 1 HOUR PRIOR TO DENTAL APPOINTMENT  . buPROPion (WELLBUTRIN) 100 MG tablet Take 100 mg by mouth daily.  [name] enoxaparin (LOVENOX) 40 MG/0.4ML injection Inject 0.4 mLs (40 mg total) into the skin daily. (Patient taking differently: Inject 40 mg into the skin as needed. )  . esomeprazole (NEXIUM) 20 MG capsule Take 20 mg by mouth daily at 12 noon.  . furosemide (LASIX) 40 MG tablet 1 DAILY.TAKE EXTRA TAB DAILY AS NEED FOR WEIGHT GAIN OF 3 LB/DAY OR 5 LB/WK OR  AWAKE WITH EDEMA  . Icosapent Ethyl (VASCEPA) 1 g CAPS Take 1 capsule (1 g total) by mouth 2 (two) times daily.  . metoprolol succinate (TOPROL-XL) 25 MG 24 hr tablet Take 1.5 tablets (37.5 mg total) by mouth daily.  . pravastatin (PRAVACHOL) 40 MG tablet Take 40 mg at bedtime by mouth.   [name] PROAIR HFA 108 (90 BASE) MCG/ACT inhaler Inhale 1-2 puffs into the lungs every 6 (six) hours as needed for wheezing.   . Sennosides (SENNA) 15 MG TABS Take 1 tablet at bedtime by mouth.   . spironolactone (ALDACTONE) 25 MG tablet TAKE 1 TABLET (25 MG TOTAL) BY MOUTH DAILY. KEEP OV  . SYNTHROID 50 MCG tablet Take 50 mcg by mouth daily.  [name] warfarin (COUMADIN) 2.5 MG tablet TAKE 1 TO 1 AND 1/2 TABLETS BY MOUTH DAILY AS DIRECTED BY COUMADIN CLINIC   Current Facility-Administered Medications for the 10/13/18 encounter (Telemedicine) with Sanda Klein, MD  Medication  . 0.9 %  sodium chloride infusion     Allergies:   Ace inhibitors; Lipitor [atorvastatin calcium]; and Codeine   Social History   Tobacco Use  . Smoking status: Former Smoker    Types: Cigarettes    Last attempt to quit: 06/02/2001    Years since quitting: 17.3  . Smokeless tobacco: Never Used  Substance Use Topics  . Alcohol use: Yes    Alcohol/week: 0.0 standard drinks    Comment: social-1 every 6 months  . Drug use: No     Family Hx: The patient's family history includes Breast cancer in her sister; Crohn's disease in her sister; Heart attack in her father and mother; Heart disease in her father, mother, sister, and son; Kidney disease in her father; Uterine cancer in her sister. There is no history of Colon cancer or Stomach cancer.  ROS:   Please see the history of present illness.     All other systems reviewed and are negative.   Prior CV studies:   The following studies were reviewed today:  08/10/2018 ECHO  1. The left ventricle has low normal systolic function, with an ejection fraction of 50-55%. The cavity size was  normal. There is mildly increased left ventricular wall thickness. Left ventricular diastolic function could not be evaluated due to mitral  valve replacement/repair.  2. The right ventricle has normal systolic function. The cavity was normal. There is no increase in right ventricular wall thickness.  3. Left atrial size was mildly dilated.  4. St. [name] Mechanical mitral valve prothesis present and functioning properly.  5. The aortic valve is tricuspid Mild thickening of the aortic valve Mild calcification of  the aortic valve. Aortic valve regurgitation is mild by color flow Doppler.  6. Pulmonic valve regurgitation is mild by color flow Doppler.  MV Peak grad:  7.4 mmHg     MV Mean grad:  4.0 mmHg     MV PHT:        85.84 msec    Labs/Other Tests and Data Reviewed:    EKG:  An ECG dated 07/28/2018 was personally reviewed today and demonstrated:  AV sequential pacing, prominent positive R waves in V1 consistent with LV capture  Recent Labs: 07/29/2018: ALT 13   Recent Lipid Panel Lab Results  Component Value Date/Time   CHOL 218 (H) 07/29/2018 09:42 AM   TRIG 536 (H) 07/29/2018 09:42 AM   HDL 27 (L) 07/29/2018 09:42 AM   CHOLHDL 8.1 (H) 07/29/2018 09:42 AM   CHOLHDL 8.2 12/09/2006 04:10 AM   LDLCALC Comment 07/29/2018 09:42 AM    Wt Readings from Last 3 Encounters:  10/13/18 113 lb 8 oz (51.5 kg)  07/28/18 112 lb 6.4 oz (51 kg)  07/09/18 112 lb 3.2 oz (50.9 kg)     Objective:    Vital Signs:  BP (!) 146/64   Pulse 86   Wt 113 lb 8 oz (51.5 kg)   BMI 20.76 kg/m    VITAL SIGNS:  reviewed Unable to examine  ASSESSMENT & PLAN:    1. CHF: No evidence of heart failure by clinical symptoms or thoracic impedance.  Left ventricular systolic function has recovered to 50-55%. 2. MVR: Normal prosthetic valve function. 3. Anticoagulation: INR in therapeutic range, no bleeding problems 4. CRT-D: Since she has recovered left ventricular systolic function and she has never  received therapy from her device, tachycardia therapies are off and ventricular sensitivity is turned up to avoid oversensing.  She is still getting appropriate resynchronization therapy.  Unable to check her device today but she has an upcoming download in a few weeks after her last device check we are still getting satisfactory biventricular pacing percentage, diminished only by an occasional episode of junctional rhythm which she has had over the years. 5. AFib: None recorded on her device in a long time.  She is anticoagulated anyway. 6. HLP: She was unable to afford Vascepa. She continues have moderate hypertriglyceridemia, but not in the range that we would be concerned about pancreatitis. 7. CKD 3  COVID-19 Education: The signs and symptoms of COVID-19 were discussed with the patient and how to seek care for testing (follow up with PCP or arrange E-visit).  The importance of social distancing was discussed today.  Time:   Today, I have spent 16 minutes with the patient with telehealth technology discussing the above problems.     Medication Adjustments/Labs and Tests Ordered: Current medicines are reviewed at length with the patient today.  Concerns regarding medicines are outlined above.   Tests Ordered: No orders of the defined types were placed in this encounter.   Medication Changes: No orders of the defined types were placed in this encounter.   Disposition:  Follow up 12 months  Signed, Sanda Klein, MD  10/13/2018 9:37 AM    [place]

## 2018-10-27 ENCOUNTER — Encounter: Payer: Medicare Other | Admitting: *Deleted

## 2018-10-28 ENCOUNTER — Telehealth: Payer: Self-pay

## 2018-10-28 NOTE — Telephone Encounter (Signed)
lmom for prescreen  

## 2018-10-28 NOTE — Telephone Encounter (Signed)
Left message for patient to remind of missed remote transmission.  

## 2018-10-29 ENCOUNTER — Ambulatory Visit (INDEPENDENT_AMBULATORY_CARE_PROVIDER_SITE_OTHER): Payer: Medicare Other

## 2018-10-29 ENCOUNTER — Other Ambulatory Visit: Payer: Self-pay

## 2018-10-29 DIAGNOSIS — Z952 Presence of prosthetic heart valve: Secondary | ICD-10-CM | POA: Diagnosis not present

## 2018-10-29 DIAGNOSIS — Z7901 Long term (current) use of anticoagulants: Secondary | ICD-10-CM | POA: Diagnosis not present

## 2018-10-29 LAB — POCT INR: INR: 3.8 — AB (ref 2.0–3.0)

## 2018-10-29 NOTE — Patient Instructions (Signed)
Description   Spoke with pt and instructed her to eat some leafy greens and changed dose to 2.5 mg daily. Recheck INR in 2 weeks. *HOLD warfarin dose x 1 if severe diarrhea*

## 2018-11-04 ENCOUNTER — Telehealth: Payer: Self-pay

## 2018-11-04 NOTE — Telephone Encounter (Signed)

## 2018-11-05 ENCOUNTER — Encounter: Payer: Self-pay | Admitting: Cardiology

## 2018-11-12 ENCOUNTER — Telehealth: Payer: Self-pay

## 2018-11-12 ENCOUNTER — Ambulatory Visit (INDEPENDENT_AMBULATORY_CARE_PROVIDER_SITE_OTHER): Payer: Medicare Other | Admitting: *Deleted

## 2018-11-12 ENCOUNTER — Other Ambulatory Visit: Payer: Self-pay

## 2018-11-12 ENCOUNTER — Other Ambulatory Visit: Payer: Self-pay | Admitting: *Deleted

## 2018-11-12 DIAGNOSIS — Z7901 Long term (current) use of anticoagulants: Secondary | ICD-10-CM | POA: Diagnosis not present

## 2018-11-12 DIAGNOSIS — Z952 Presence of prosthetic heart valve: Secondary | ICD-10-CM

## 2018-11-12 LAB — POCT INR: INR: 2.3 (ref 2.0–3.0)

## 2018-11-12 MED ORDER — METOPROLOL SUCCINATE ER 25 MG PO TB24: 37.5000 mg | ORAL_TABLET | Freq: Every day | ORAL | 3 refills | Status: DC

## 2018-11-12 NOTE — Patient Instructions (Signed)
Description   Today take 3.49m (1.5 tablets) then resume taking  2.5 mg daily. Recheck INR in 2 weeks. *HOLD warfarin dose x 1 if severe diarrhea*

## 2018-11-12 NOTE — Telephone Encounter (Signed)
Pt tried to send a remote transmission with her home monitor but received the error code [id number]. I gave her the number to [facility] support to get additional help. The pt states she is going to call Monday.

## 2018-11-19 ENCOUNTER — Telehealth: Payer: Self-pay

## 2018-11-19 ENCOUNTER — Ambulatory Visit (INDEPENDENT_AMBULATORY_CARE_PROVIDER_SITE_OTHER): Payer: Medicare Other | Admitting: *Deleted

## 2018-11-19 DIAGNOSIS — I5032 Chronic diastolic (congestive) heart failure: Secondary | ICD-10-CM

## 2018-11-19 NOTE — Telephone Encounter (Signed)
Pt received her new monitor. Transmission received 11-19-2018. I told the pt the nurse will look at the transmission and give her a call back if she see anything. If the nurse do not see anything she will not receive a call back.

## 2018-11-19 NOTE — Telephone Encounter (Signed)
No new alerts noted. Transmission added to schedule for processing.

## 2018-11-22 ENCOUNTER — Telehealth: Payer: Self-pay

## 2018-11-22 LAB — CUP PACEART REMOTE DEVICE CHECK
Battery Remaining Longevity: 94 mo
Battery Voltage: 3 V
Brady Statistic AP VP Percent: 75.84 %
Brady Statistic AP VS Percent: 2.26 %
Brady Statistic AS VP Percent: 20.53 %
Brady Statistic AS VS Percent: 1.38 %
Brady Statistic RA Percent Paced: 69.45 %
Brady Statistic RV Percent Paced: 19.6 %
Date Time Interrogation Session: 20200619191455
HighPow Impedance: 101 Ohm
Implantable Lead Implant Date: 20080221
Implantable Lead Implant Date: 20080221
Implantable Lead Implant Date: 20080221
Implantable Lead Location: 753858
Implantable Lead Location: 753859
Implantable Lead Location: 753860
Implantable Lead Model: 4543
Implantable Lead Model: 5076
Implantable Lead Model: 7002
Implantable Lead Serial Number: 134379
Implantable Pulse Generator Implant Date: 20181128
Lead Channel Impedance Value: 380 Ohm
Lead Channel Impedance Value: 399 Ohm
Lead Channel Impedance Value: 494 Ohm
Lead Channel Impedance Value: 513 Ohm
Lead Channel Impedance Value: 532 Ohm
Lead Channel Impedance Value: 893 Ohm
Lead Channel Pacing Threshold Amplitude: 0.75 V
Lead Channel Pacing Threshold Amplitude: 0.75 V
Lead Channel Pacing Threshold Amplitude: 1 V
Lead Channel Pacing Threshold Pulse Width: 0.4 ms
Lead Channel Pacing Threshold Pulse Width: 0.4 ms
Lead Channel Pacing Threshold Pulse Width: 0.4 ms
Lead Channel Sensing Intrinsic Amplitude: 2.5 mV
Lead Channel Sensing Intrinsic Amplitude: 2.5 mV
Lead Channel Sensing Intrinsic Amplitude: 31.625 mV
Lead Channel Sensing Intrinsic Amplitude: 31.625 mV
Lead Channel Setting Pacing Amplitude: 1.5 V
Lead Channel Setting Pacing Amplitude: 1.5 V
Lead Channel Setting Pacing Amplitude: 2 V
Lead Channel Setting Pacing Pulse Width: 0.4 ms
Lead Channel Setting Pacing Pulse Width: 0.4 ms
Lead Channel Setting Sensing Sensitivity: 1.2 mV

## 2018-11-22 NOTE — Telephone Encounter (Signed)

## 2018-11-25 NOTE — Progress Notes (Signed)
Remote ICD transmission.   

## 2018-11-26 ENCOUNTER — Other Ambulatory Visit: Payer: Self-pay

## 2018-11-26 ENCOUNTER — Ambulatory Visit (INDEPENDENT_AMBULATORY_CARE_PROVIDER_SITE_OTHER): Payer: Medicare Other | Admitting: *Deleted

## 2018-11-26 DIAGNOSIS — Z7901 Long term (current) use of anticoagulants: Secondary | ICD-10-CM | POA: Diagnosis not present

## 2018-11-26 DIAGNOSIS — Z952 Presence of prosthetic heart valve: Secondary | ICD-10-CM | POA: Diagnosis not present

## 2018-11-26 LAB — POCT INR: INR: 2.4 (ref 2.0–3.0)

## 2018-11-26 NOTE — Patient Instructions (Signed)
Description   Today take 1.5 tablets, then change your dose to 1 tablet daily except 1.5 tablets on Fridays. Recheck INR in 2 weeks. *HOLD warfarin dose x 1 if severe diarrhea*

## 2018-12-06 ENCOUNTER — Telehealth: Payer: Self-pay

## 2018-12-06 NOTE — Telephone Encounter (Signed)

## 2018-12-10 ENCOUNTER — Other Ambulatory Visit: Payer: Self-pay

## 2018-12-10 ENCOUNTER — Ambulatory Visit (INDEPENDENT_AMBULATORY_CARE_PROVIDER_SITE_OTHER): Payer: Medicare Other | Admitting: *Deleted

## 2018-12-10 DIAGNOSIS — Z952 Presence of prosthetic heart valve: Secondary | ICD-10-CM

## 2018-12-10 DIAGNOSIS — Z7901 Long term (current) use of anticoagulants: Secondary | ICD-10-CM

## 2018-12-10 LAB — POCT INR: INR: 1.7 — AB (ref 2.0–3.0)

## 2018-12-10 NOTE — Patient Instructions (Signed)
Description   Today take 2  tablets, then continue taking 1 tablet daily except 1.5 tablets on Fridays. Recheck INR in 2 weeks.

## 2018-12-22 ENCOUNTER — Telehealth: Payer: Self-pay

## 2018-12-22 NOTE — Telephone Encounter (Signed)

## 2018-12-24 ENCOUNTER — Other Ambulatory Visit: Payer: Self-pay

## 2018-12-24 ENCOUNTER — Ambulatory Visit (INDEPENDENT_AMBULATORY_CARE_PROVIDER_SITE_OTHER): Payer: Medicare Other | Admitting: *Deleted

## 2018-12-24 DIAGNOSIS — Z952 Presence of prosthetic heart valve: Secondary | ICD-10-CM | POA: Diagnosis not present

## 2018-12-24 DIAGNOSIS — Z7901 Long term (current) use of anticoagulants: Secondary | ICD-10-CM | POA: Diagnosis not present

## 2018-12-24 LAB — POCT INR: INR: 2.8 (ref 2.0–3.0)

## 2018-12-24 NOTE — Patient Instructions (Signed)
Description   Continue taking 1 tablet daily except 1.5 tablets on Fridays. Recheck INR in 2- 3 weeks.

## 2019-01-11 ENCOUNTER — Ambulatory Visit (INDEPENDENT_AMBULATORY_CARE_PROVIDER_SITE_OTHER): Payer: Medicare Other | Admitting: Pharmacist Clinician (PhC)/ Clinical Pharmacy Specialist

## 2019-01-11 ENCOUNTER — Other Ambulatory Visit: Payer: Self-pay

## 2019-01-11 DIAGNOSIS — Z7901 Long term (current) use of anticoagulants: Secondary | ICD-10-CM

## 2019-01-11 DIAGNOSIS — Z952 Presence of prosthetic heart valve: Secondary | ICD-10-CM | POA: Diagnosis not present

## 2019-01-11 LAB — POCT INR: INR: 3.8 — AB (ref 2.0–3.0)

## 2019-01-20 ENCOUNTER — Other Ambulatory Visit: Payer: Self-pay | Admitting: Cardiovascular Disease

## 2019-01-24 NOTE — Telephone Encounter (Signed)
Rx(s) sent to pharmacy electronically.  

## 2019-02-01 ENCOUNTER — Ambulatory Visit (INDEPENDENT_AMBULATORY_CARE_PROVIDER_SITE_OTHER): Payer: Medicare Other | Admitting: Pharmacist

## 2019-02-01 ENCOUNTER — Other Ambulatory Visit: Payer: Self-pay

## 2019-02-01 DIAGNOSIS — I341 Nonrheumatic mitral (valve) prolapse: Secondary | ICD-10-CM | POA: Diagnosis not present

## 2019-02-01 DIAGNOSIS — Z7901 Long term (current) use of anticoagulants: Secondary | ICD-10-CM | POA: Diagnosis not present

## 2019-02-01 LAB — POCT INR: INR: 2.8 (ref 2.0–3.0)

## 2019-02-21 ENCOUNTER — Ambulatory Visit (INDEPENDENT_AMBULATORY_CARE_PROVIDER_SITE_OTHER): Payer: Medicare Other | Admitting: *Deleted

## 2019-02-21 DIAGNOSIS — I5032 Chronic diastolic (congestive) heart failure: Secondary | ICD-10-CM | POA: Diagnosis not present

## 2019-02-22 LAB — CUP PACEART REMOTE DEVICE CHECK
Battery Remaining Longevity: 87 mo
Battery Voltage: 3 V
Brady Statistic AP VP Percent: 65.44 %
Brady Statistic AP VS Percent: 2.79 %
Brady Statistic AS VP Percent: 27.49 %
Brady Statistic AS VS Percent: 4.29 %
Brady Statistic RA Percent Paced: 59.56 %
Brady Statistic RV Percent Paced: 16.08 %
Date Time Interrogation Session: 20200921144621
HighPow Impedance: 94 Ohm
Implantable Lead Implant Date: 20080221
Implantable Lead Implant Date: 20080221
Implantable Lead Implant Date: 20080221
Implantable Lead Location: 753858
Implantable Lead Location: 753859
Implantable Lead Location: 753860
Implantable Lead Model: 4543
Implantable Lead Model: 5076
Implantable Lead Model: 7002
Implantable Lead Serial Number: 134379
Implantable Pulse Generator Implant Date: 20181128
Lead Channel Impedance Value: 323 Ohm
Lead Channel Impedance Value: 342 Ohm
Lead Channel Impedance Value: 437 Ohm
Lead Channel Impedance Value: 456 Ohm
Lead Channel Impedance Value: 494 Ohm
Lead Channel Impedance Value: 836 Ohm
Lead Channel Pacing Threshold Amplitude: 0.625 V
Lead Channel Pacing Threshold Amplitude: 0.75 V
Lead Channel Pacing Threshold Amplitude: 1.125 V
Lead Channel Pacing Threshold Pulse Width: 0.4 ms
Lead Channel Pacing Threshold Pulse Width: 0.4 ms
Lead Channel Pacing Threshold Pulse Width: 0.4 ms
Lead Channel Sensing Intrinsic Amplitude: 2.75 mV
Lead Channel Sensing Intrinsic Amplitude: 2.75 mV
Lead Channel Sensing Intrinsic Amplitude: 23.375 mV
Lead Channel Sensing Intrinsic Amplitude: 23.375 mV
Lead Channel Setting Pacing Amplitude: 1.5 V
Lead Channel Setting Pacing Amplitude: 1.75 V
Lead Channel Setting Pacing Amplitude: 2 V
Lead Channel Setting Pacing Pulse Width: 0.4 ms
Lead Channel Setting Pacing Pulse Width: 0.4 ms
Lead Channel Setting Sensing Sensitivity: 1.2 mV

## 2019-02-28 ENCOUNTER — Other Ambulatory Visit: Payer: Self-pay | Admitting: Internal Medicine

## 2019-02-28 DIAGNOSIS — Z1231 Encounter for screening mammogram for malignant neoplasm of breast: Secondary | ICD-10-CM

## 2019-03-01 ENCOUNTER — Encounter: Payer: Self-pay | Admitting: Cardiology

## 2019-03-01 NOTE — Progress Notes (Signed)
Remote ICD transmission.   

## 2019-03-03 ENCOUNTER — Ambulatory Visit (INDEPENDENT_AMBULATORY_CARE_PROVIDER_SITE_OTHER): Payer: Medicare Other | Admitting: Pharmacist

## 2019-03-03 ENCOUNTER — Other Ambulatory Visit: Payer: Self-pay

## 2019-03-03 DIAGNOSIS — Z7901 Long term (current) use of anticoagulants: Secondary | ICD-10-CM

## 2019-03-03 DIAGNOSIS — Z952 Presence of prosthetic heart valve: Secondary | ICD-10-CM

## 2019-03-03 LAB — POCT INR: INR: 3.5 — AB (ref 2.0–3.0)

## 2019-03-30 ENCOUNTER — Other Ambulatory Visit: Payer: Self-pay

## 2019-03-30 ENCOUNTER — Ambulatory Visit (INDEPENDENT_AMBULATORY_CARE_PROVIDER_SITE_OTHER): Payer: Medicare Other | Admitting: Pharmacist

## 2019-03-30 DIAGNOSIS — Z7901 Long term (current) use of anticoagulants: Secondary | ICD-10-CM

## 2019-03-30 DIAGNOSIS — Z952 Presence of prosthetic heart valve: Secondary | ICD-10-CM

## 2019-03-30 LAB — POCT INR: INR: 2.4 (ref 2.0–3.0)

## 2019-04-13 ENCOUNTER — Ambulatory Visit
Admission: RE | Admit: 2019-04-13 | Discharge: 2019-04-13 | Disposition: A | Discharge status: Home / Self Care | Payer: Medicare Other | Source: Ambulatory Visit | Attending: Internal Medicine | Admitting: Internal Medicine

## 2019-04-13 ENCOUNTER — Other Ambulatory Visit: Payer: Self-pay

## 2019-04-13 DIAGNOSIS — Z1231 Encounter for screening mammogram for malignant neoplasm of breast: Secondary | ICD-10-CM

## 2019-04-27 ENCOUNTER — Other Ambulatory Visit: Payer: Self-pay

## 2019-04-27 ENCOUNTER — Ambulatory Visit (INDEPENDENT_AMBULATORY_CARE_PROVIDER_SITE_OTHER): Payer: Medicare Other | Admitting: Pharmacist Clinician (PhC)/ Clinical Pharmacy Specialist

## 2019-04-27 DIAGNOSIS — Z952 Presence of prosthetic heart valve: Secondary | ICD-10-CM

## 2019-04-27 DIAGNOSIS — Z7901 Long term (current) use of anticoagulants: Secondary | ICD-10-CM

## 2019-04-27 LAB — POCT INR: INR: 3.8 — AB (ref 2.0–3.0)

## 2019-05-20 ENCOUNTER — Other Ambulatory Visit: Payer: Self-pay | Admitting: Cardiovascular Disease

## 2019-05-23 ENCOUNTER — Ambulatory Visit (INDEPENDENT_AMBULATORY_CARE_PROVIDER_SITE_OTHER): Payer: Medicare Other | Admitting: *Deleted

## 2019-05-23 DIAGNOSIS — I5032 Chronic diastolic (congestive) heart failure: Secondary | ICD-10-CM | POA: Diagnosis not present

## 2019-05-23 LAB — CUP PACEART REMOTE DEVICE CHECK
Battery Remaining Longevity: 86 mo
Battery Voltage: 3 V
Brady Statistic AP VP Percent: 82.71 %
Brady Statistic AP VS Percent: 2.07 %
Brady Statistic AS VP Percent: 14.31 %
Brady Statistic AS VS Percent: 0.91 %
Brady Statistic RA Percent Paced: 77.22 %
Brady Statistic RV Percent Paced: 34.93 %
Date Time Interrogation Session: 20201221105720
HighPow Impedance: 93 Ohm
Implantable Lead Implant Date: 20080221
Implantable Lead Implant Date: 20080221
Implantable Lead Implant Date: 20080221
Implantable Lead Location: 753858
Implantable Lead Location: 753859
Implantable Lead Location: 753860
Implantable Lead Model: 4543
Implantable Lead Model: 5076
Implantable Lead Model: 7002
Implantable Lead Serial Number: 134379
Implantable Pulse Generator Implant Date: 20181128
Lead Channel Impedance Value: 342 Ohm
Lead Channel Impedance Value: 380 Ohm
Lead Channel Impedance Value: 437 Ohm
Lead Channel Impedance Value: 456 Ohm
Lead Channel Impedance Value: 494 Ohm
Lead Channel Impedance Value: 779 Ohm
Lead Channel Pacing Threshold Amplitude: 0.625 V
Lead Channel Pacing Threshold Amplitude: 0.75 V
Lead Channel Pacing Threshold Amplitude: 1 V
Lead Channel Pacing Threshold Pulse Width: 0.4 ms
Lead Channel Pacing Threshold Pulse Width: 0.4 ms
Lead Channel Pacing Threshold Pulse Width: 0.4 ms
Lead Channel Sensing Intrinsic Amplitude: 2.25 mV
Lead Channel Sensing Intrinsic Amplitude: 2.25 mV
Lead Channel Sensing Intrinsic Amplitude: 26.25 mV
Lead Channel Sensing Intrinsic Amplitude: 26.25 mV
Lead Channel Setting Pacing Amplitude: 1.5 V
Lead Channel Setting Pacing Amplitude: 1.5 V
Lead Channel Setting Pacing Amplitude: 2 V
Lead Channel Setting Pacing Pulse Width: 0.4 ms
Lead Channel Setting Pacing Pulse Width: 0.4 ms
Lead Channel Setting Sensing Sensitivity: 1.2 mV

## 2019-05-25 ENCOUNTER — Ambulatory Visit (INDEPENDENT_AMBULATORY_CARE_PROVIDER_SITE_OTHER): Payer: Medicare Other | Admitting: Pharmacist Clinician (PhC)/ Clinical Pharmacy Specialist

## 2019-05-25 ENCOUNTER — Other Ambulatory Visit: Payer: Self-pay

## 2019-05-25 DIAGNOSIS — Z952 Presence of prosthetic heart valve: Secondary | ICD-10-CM

## 2019-05-25 DIAGNOSIS — Z7901 Long term (current) use of anticoagulants: Secondary | ICD-10-CM | POA: Diagnosis not present

## 2019-05-25 LAB — POCT INR: INR: 1.9 — AB (ref 2.0–3.0)

## 2019-06-22 ENCOUNTER — Other Ambulatory Visit: Payer: Self-pay

## 2019-06-22 ENCOUNTER — Ambulatory Visit (INDEPENDENT_AMBULATORY_CARE_PROVIDER_SITE_OTHER): Payer: Medicare Other | Admitting: Pharmacist Clinician (PhC)/ Clinical Pharmacy Specialist

## 2019-06-22 ENCOUNTER — Encounter: Payer: Self-pay | Admitting: Pharmacist Clinician (PhC)/ Clinical Pharmacy Specialist

## 2019-06-22 DIAGNOSIS — Z952 Presence of prosthetic heart valve: Secondary | ICD-10-CM | POA: Diagnosis not present

## 2019-06-22 DIAGNOSIS — Z7901 Long term (current) use of anticoagulants: Secondary | ICD-10-CM | POA: Diagnosis not present

## 2019-06-22 LAB — POCT INR: INR: 6.3 — AB (ref 2.0–3.0)

## 2019-06-29 ENCOUNTER — Encounter (INDEPENDENT_AMBULATORY_CARE_PROVIDER_SITE_OTHER): Payer: Self-pay

## 2019-06-29 ENCOUNTER — Ambulatory Visit (INDEPENDENT_AMBULATORY_CARE_PROVIDER_SITE_OTHER): Payer: Medicare Other | Admitting: Pharmacist

## 2019-06-29 ENCOUNTER — Other Ambulatory Visit: Payer: Self-pay

## 2019-06-29 DIAGNOSIS — Z7901 Long term (current) use of anticoagulants: Secondary | ICD-10-CM

## 2019-06-29 DIAGNOSIS — Z952 Presence of prosthetic heart valve: Secondary | ICD-10-CM

## 2019-06-29 LAB — POCT INR: INR: 1.4 — AB (ref 2.0–3.0)

## 2019-07-13 ENCOUNTER — Other Ambulatory Visit: Payer: Self-pay

## 2019-07-13 ENCOUNTER — Ambulatory Visit (INDEPENDENT_AMBULATORY_CARE_PROVIDER_SITE_OTHER): Payer: Medicare Other | Admitting: Pharmacist Clinician (PhC)/ Clinical Pharmacy Specialist

## 2019-07-13 DIAGNOSIS — Z7901 Long term (current) use of anticoagulants: Secondary | ICD-10-CM | POA: Diagnosis not present

## 2019-07-13 DIAGNOSIS — Z952 Presence of prosthetic heart valve: Secondary | ICD-10-CM | POA: Diagnosis not present

## 2019-07-13 LAB — POCT INR: INR: 2 (ref 2.0–3.0)

## 2019-07-26 ENCOUNTER — Other Ambulatory Visit: Payer: Self-pay | Admitting: Physician Assistant

## 2019-07-27 ENCOUNTER — Ambulatory Visit (INDEPENDENT_AMBULATORY_CARE_PROVIDER_SITE_OTHER): Payer: Medicare Other | Admitting: Pharmacist Clinician (PhC)/ Clinical Pharmacy Specialist

## 2019-07-27 ENCOUNTER — Other Ambulatory Visit: Payer: Self-pay

## 2019-07-27 DIAGNOSIS — Z7901 Long term (current) use of anticoagulants: Secondary | ICD-10-CM | POA: Diagnosis not present

## 2019-07-27 DIAGNOSIS — Z952 Presence of prosthetic heart valve: Secondary | ICD-10-CM

## 2019-07-27 LAB — POCT INR: INR: 2 (ref 2.0–3.0)

## 2019-07-29 ENCOUNTER — Ambulatory Visit: Payer: Medicare Other | Admitting: Cardiovascular Disease

## 2019-07-29 ENCOUNTER — Other Ambulatory Visit: Payer: Self-pay

## 2019-07-29 ENCOUNTER — Encounter: Payer: Self-pay | Admitting: Cardiovascular Disease

## 2019-07-29 VITALS — BP 163/69 | HR 72 | Ht 62.0 in | Wt 107.0 lb

## 2019-07-29 DIAGNOSIS — Z952 Presence of prosthetic heart valve: Secondary | ICD-10-CM

## 2019-07-29 DIAGNOSIS — I5032 Chronic diastolic (congestive) heart failure: Secondary | ICD-10-CM

## 2019-07-29 DIAGNOSIS — Z95810 Presence of automatic (implantable) cardiac defibrillator: Secondary | ICD-10-CM

## 2019-07-29 DIAGNOSIS — Z950 Presence of cardiac pacemaker: Secondary | ICD-10-CM

## 2019-07-29 NOTE — Assessment & Plan Note (Signed)
History of hyperlipidemia on Pravachol with lipid profile performed 12/31/2018 revealing total cholesterol 235, LDL of 112 and an HDL of 36.  She is not at goal but is somewhat underweight and likely has normal coronary arteries by cardiac catheterization.

## 2019-07-29 NOTE — Patient Instructions (Signed)
Medication Instructions:  Your physician recommends that you continue on your current medications as directed. Please refer to the Current Medication list given to you today.  If you need a refill on your cardiac medications before your next appointment, please call your pharmacy.   Lab work: NONE  Testing/Procedures: Your physician has requested that you have an echocardiogram next month. Echocardiography is a painless test that uses sound waves to create images of your heart. It provides your doctor with information about the size and shape of your heart and how well your heart's chambers and valves are working. This procedure takes approximately one hour. There are no restrictions for this procedure. [address]  Follow-Up: At [facility], you and your health needs are our priority.  As part of our continuing mission to provide you with exceptional heart care, we have created designated Provider Care Teams.  These Care Teams include your primary Cardiologist (physician) and Advanced Practice Providers (APPs -  Physician Assistants and Nurse Practitioners) who all work together to provide you with the care you need, when you need it. You may see Dr. Gwenlyn Found or one of the following Advanced Practice Providers on your designated Care Team:    Kerin Ransom, PA-C  [place], [place]  Coletta Memos, [place]  Your physician wants you to follow-up in: 1 year with Dr. Gwenlyn Found. You will receive a reminder letter in the mail two months in advance. If you don't receive a letter, please call our office to schedule the follow-up appointment.

## 2019-07-29 NOTE — Assessment & Plan Note (Signed)
History of mitral valve repair by Dr. Amador Cunas in 2002 with redo mitral valve replacement using [facility] [name] by Dr. Cyndia Bent in 2007.  She did have normal coronary arteries at that time.  Her most recent 2D echocardiogram performed 08/10/2018 revealed normal LV systolic function with a well-functioning mitral mechanical prosthesis.  She is on Coumadin anticoagulation for for this and is followed here in our clinic

## 2019-07-29 NOTE — Assessment & Plan Note (Signed)
History of biventricular ICD placed by Dr. Sallyanne Kuster 04/28/2017 with using a Lovenox bridge with improvement in her ejection fraction from 10% up to 50 to 55%.  She is otherwise asymptomatic.  This is followed by Dr. Sallyanne Kuster in person on an annual basis and poorly by remote monitoring.  She has not had any arrhythmias noted.

## 2019-07-29 NOTE — Progress Notes (Signed)
07/29/2019 [name]   04/19/1941  [id number]  Primary Physician Kathleen Battles, MD Primary Cardiologist: Kathleen Harp MD [name], [place], [place]  HPI:  [name] is a 79 y.o.  thin appearing, married, Caucasian female, mother of three (one deceased child), grandmother to eight grandchildren, who I last sawin the office  07/28/2018.[name] Her husband is also a patient of mine and accompanies her today.  He had stage IV lung cancer, which responded to chemotherapy. 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 Berle [name] in 2002 with redo with a [name] MVR performed by Dr. Gilford [name] in 2007. She had normal coronary arteries. Her EF improved initially from 10% to normal with cardiac resynchronization therapy. Dr. Sallyanne [name] follows this. She has undergone a generator change by Dr. Sallyanne [name] on April 22, 2011, and she is followed by [facility] [employer]. Recent 2-D echocardiogram performed 9/7/17revealed normal ejection fraction with a normal functioning mitral mechanical prosthesis.She denies chest pain or shortness of breath.  She had a bi-V generator change by Dr. Sallyanne [name] 04/28/17 after having a Lovenox bridge for her mitral valve. She is otherwise asymptomatic with her most recent 2-D echo performed 08/05/2017 revealing an EF of 50 to 55% with a well-functioning mitral mechanical prosthesis.  Since I saw her a year ago she is remained asymptomatic specifically denying chest pain or shortness of breath.  Her fasting lipid profile was less than optimal with a LDL of 112 measured 12/31/2018 on Pravachol.  Unfortunately, her eldest son suffered a myocardial infarction with sudden cardiac death and has been on a ventilator at [facility], declared brain dead and is going to be extubated today.  They are taking care of his 14 year old son.   Current Meds  Medication Sig  . allopurinol (ZYLOPRIM) 300 MG tablet Take 300  mg by mouth every evening.   [name] ALPRAZolam (XANAX) 0.25 MG tablet Take 0.25 mg by mouth 3 (three) times daily as needed for anxiety.  [name] amoxicillin (AMOXIL) 500 MG capsule TAKE 4 CAPSULES BY MOUTH 1 HOUR PRIOR TO DENTAL APPOINTMENT  . buPROPion (WELLBUTRIN) 100 MG tablet Take 100 mg by mouth daily.  [name] enoxaparin (LOVENOX) 40 MG/0.4ML injection Inject 0.4 mLs (40 mg total) into the skin daily. (Patient taking differently: Inject 40 mg into the skin as needed. )  . esomeprazole (NEXIUM) 20 MG capsule Take 20 mg by mouth daily at 12 noon.  . furosemide (LASIX) 40 MG tablet Take 1 tablet (40 mg total) by mouth daily. **needs OV for future refills**  . hydroxychloroquine (PLAQUENIL) 200 MG tablet Take by mouth daily. Pt take 100 mg daily  . Icosapent Ethyl (VASCEPA) 1 g CAPS Take 1 capsule (1 g total) by mouth 2 (two) times daily.  . metoprolol succinate (TOPROL-XL) 25 MG 24 hr tablet Take 1.5 tablets (37.5 mg total) by mouth daily.  . pravastatin (PRAVACHOL) 40 MG tablet Take 40 mg at bedtime by mouth.   [name] PROAIR HFA 108 (90 BASE) MCG/ACT inhaler Inhale 1-2 puffs into the lungs every 6 (six) hours as needed for wheezing.   . Sennosides (SENNA) 15 MG TABS Take 1 tablet at bedtime by mouth.   . spironolactone (ALDACTONE) 25 MG tablet Take 1 tablet (25 mg total) by mouth daily.  [name] SYNTHROID 50 MCG tablet Take 50 mcg by mouth daily.  . traMADol (ULTRAM) 50 MG tablet Take 50 mg by mouth at bedtime as needed.  [name]  warfarin (COUMADIN) 2.5 MG tablet TAKE 1 TO 1 & 1/2 TABLETS BY MOUTH DAILY AS DIRECTED BY COUMADIN CLINIC   Current Facility-Administered Medications for the 07/29/19 encounter (Office Visit) with Kathleen Harp, MD  Medication  . 0.9 %  sodium chloride infusion     Allergies  Allergen Reactions  . Ace Inhibitors Cough  . Lipitor [Atorvastatin Calcium] Other (See Comments)    Knee pain.  . Codeine Nausea And Vomiting    Social History   Socioeconomic History  . Marital status:  Married    Spouse name: [name]  . Number of children: 4  . Years of education: 24  . Highest education level: Not on file  Occupational History  . Occupation: retired  Tobacco Use  . Smoking status: Former Smoker    Types: Cigarettes    Quit date: 06/02/2001    Years since quitting: 18.1  . Smokeless tobacco: Never Used  Substance and Sexual Activity  . Alcohol use: Yes    Alcohol/week: 0.0 standard drinks    Comment: social-1 every 6 months  . Drug use: No  . Sexual activity: Not on file  Other Topics Concern  . Not on file  Social History Narrative   Drinks very little caffeine    Social Determinants of Health   Financial Resource Strain:   . Difficulty of Paying Living Expenses: Not on file  Food Insecurity:   . Worried About Charity fundraiser in the Last Year: Not on file  . Ran Out of Food in the Last Year: Not on file  Transportation Needs:   . Lack of Transportation (Medical): Not on file  . Lack of Transportation (Non-Medical): Not on file  Physical Activity:   . Days of Exercise per Week: Not on file  . Minutes of Exercise per Session: Not on file  Stress:   . Feeling of Stress : Not on file  Social Connections:   . Frequency of Communication with Friends and Family: Not on file  . Frequency of Social Gatherings with Friends and Family: Not on file  . Attends Religious Services: Not on file  . Active Member of Clubs or Organizations: Not on file  . Attends Archivist Meetings: Not on file  . Marital Status: Not on file  Intimate Partner Violence:   . Fear of Current or Ex-Partner: Not on file  . Emotionally Abused: Not on file  . Physically Abused: Not on file  . Sexually Abused: Not on file     Review of Systems: General: negative for chills, fever, night sweats or weight changes.  Cardiovascular: negative for chest pain, dyspnea on exertion, edema, orthopnea, palpitations, paroxysmal nocturnal dyspnea or shortness of breath Dermatological:  negative for rash Respiratory: negative for cough or wheezing Urologic: negative for hematuria Abdominal: negative for nausea, vomiting, diarrhea, bright red blood per rectum, melena, or hematemesis Neurologic: negative for visual changes, syncope, or dizziness All other systems reviewed and are otherwise negative except as noted above.    Blood pressure (!) 163/69, pulse 72, height 5' 2"  (1.575 m), weight 107 lb (48.5 kg), SpO2 98 %.  General appearance: alert and no distress Neck: no adenopathy, no carotid bruit, no JVD, supple, symmetrical, trachea midline and thyroid not enlarged, symmetric, no tenderness/mass/nodules Lungs: clear to auscultation bilaterally Heart: Chris prosthetic mitral valve sounds Extremities: extremities normal, atraumatic, no cyanosis or edema Pulses: 2+ and symmetric Skin: Skin color, texture, turgor normal. No rashes or lesions Neurologic: Alert and oriented X  3, normal strength and tone. Normal symmetric reflexes. Normal coordination and gait  EKG AV sequentially paced rhythm at 72.  I personally reviewed this EKG.  ASSESSMENT AND PLAN:   History of mitral valve replacement with mechanical valve History of mitral valve repair by Dr. Amador Cunas in 2002 with redo mitral valve replacement using [facility] [name] by Dr. Cyndia Bent in 2007.  She did have normal coronary arteries at that time.  Her most recent 2D echocardiogram performed 08/10/2018 revealed normal LV systolic function with a well-functioning mitral mechanical prosthesis.  She is on Coumadin anticoagulation for for this and is followed here in our clinic  HYPERLIPIDEMIA History of hyperlipidemia on Pravachol with lipid profile performed 12/31/2018 revealing total cholesterol 235, LDL of 112 and an HDL of 36.  She is not at goal but is somewhat underweight and likely has normal coronary arteries by cardiac catheterization.  Biventricular ICD (implantable cardioverter-defibrillator) in place History of  biventricular ICD placed by Dr. Sallyanne [name] 04/28/2017 with using a Lovenox bridge with improvement in her ejection fraction from 10% up to 50 to 55%.  She is otherwise asymptomatic.  This is followed by Dr. Sallyanne [name] in person on an annual basis and poorly by remote monitoring.  She has not had any arrhythmias noted.      Kathleen Harp MD FACP,FACC,FAHA, [facility] 07/29/2019 10:28 AM

## 2019-08-10 ENCOUNTER — Other Ambulatory Visit: Payer: Self-pay

## 2019-08-10 ENCOUNTER — Ambulatory Visit (INDEPENDENT_AMBULATORY_CARE_PROVIDER_SITE_OTHER): Payer: Medicare Other | Admitting: Pharmacist

## 2019-08-10 DIAGNOSIS — Z952 Presence of prosthetic heart valve: Secondary | ICD-10-CM | POA: Diagnosis not present

## 2019-08-10 DIAGNOSIS — Z7901 Long term (current) use of anticoagulants: Secondary | ICD-10-CM

## 2019-08-10 LAB — POCT INR: INR: 2.4 (ref 2.0–3.0)

## 2019-08-15 ENCOUNTER — Other Ambulatory Visit: Payer: Self-pay

## 2019-08-15 ENCOUNTER — Ambulatory Visit (HOSPITAL_COMMUNITY): Payer: Medicare Other | Attending: Internal Medicine

## 2019-08-15 DIAGNOSIS — Z952 Presence of prosthetic heart valve: Secondary | ICD-10-CM | POA: Insufficient documentation

## 2019-08-17 ENCOUNTER — Telehealth: Payer: Self-pay | Admitting: Cardiovascular Disease

## 2019-08-17 ENCOUNTER — Other Ambulatory Visit: Payer: Self-pay

## 2019-08-17 DIAGNOSIS — Z952 Presence of prosthetic heart valve: Secondary | ICD-10-CM

## 2019-08-17 DIAGNOSIS — I341 Nonrheumatic mitral (valve) prolapse: Secondary | ICD-10-CM

## 2019-08-17 NOTE — Progress Notes (Signed)
echo

## 2019-08-17 NOTE — Telephone Encounter (Signed)
°  Pt returning call regarding echo results.  Please call

## 2019-08-17 NOTE — Telephone Encounter (Signed)
Spoke to patient echo results given.Advised to repeat in 12 months.

## 2019-08-22 ENCOUNTER — Ambulatory Visit (INDEPENDENT_AMBULATORY_CARE_PROVIDER_SITE_OTHER): Payer: Medicare Other | Admitting: *Deleted

## 2019-08-22 DIAGNOSIS — I5032 Chronic diastolic (congestive) heart failure: Secondary | ICD-10-CM

## 2019-08-23 LAB — CUP PACEART REMOTE DEVICE CHECK
Battery Remaining Longevity: 79 mo
Battery Voltage: 2.99 V
Brady Statistic AP VP Percent: 75.95 %
Brady Statistic AP VS Percent: 2.37 %
Brady Statistic AS VP Percent: 20.24 %
Brady Statistic AS VS Percent: 1.44 %
Brady Statistic RA Percent Paced: 72.88 %
Brady Statistic RV Percent Paced: 19.41 %
Date Time Interrogation Session: 20210323043626
HighPow Impedance: 96 Ohm
Implantable Lead Implant Date: 20080221
Implantable Lead Implant Date: 20080221
Implantable Lead Implant Date: 20080221
Implantable Lead Location: 753858
Implantable Lead Location: 753859
Implantable Lead Location: 753860
Implantable Lead Model: 4543
Implantable Lead Model: 5076
Implantable Lead Model: 7002
Implantable Lead Serial Number: 134379
Implantable Pulse Generator Implant Date: 20181128
Lead Channel Impedance Value: 266 Ohm
Lead Channel Impedance Value: 380 Ohm
Lead Channel Impedance Value: 399 Ohm
Lead Channel Impedance Value: 437 Ohm
Lead Channel Impedance Value: 456 Ohm
Lead Channel Impedance Value: 760 Ohm
Lead Channel Pacing Threshold Amplitude: 0.75 V
Lead Channel Pacing Threshold Amplitude: 1 V
Lead Channel Pacing Threshold Amplitude: 1.375 V
Lead Channel Pacing Threshold Pulse Width: 0.4 ms
Lead Channel Pacing Threshold Pulse Width: 0.4 ms
Lead Channel Pacing Threshold Pulse Width: 0.4 ms
Lead Channel Sensing Intrinsic Amplitude: 2.5 mV
Lead Channel Sensing Intrinsic Amplitude: 2.5 mV
Lead Channel Sensing Intrinsic Amplitude: 24 mV
Lead Channel Sensing Intrinsic Amplitude: 24 mV
Lead Channel Setting Pacing Amplitude: 1.5 V
Lead Channel Setting Pacing Amplitude: 1.5 V
Lead Channel Setting Pacing Amplitude: 2.75 V
Lead Channel Setting Pacing Pulse Width: 0.4 ms
Lead Channel Setting Pacing Pulse Width: 0.4 ms
Lead Channel Setting Sensing Sensitivity: 1.2 mV

## 2019-08-31 ENCOUNTER — Other Ambulatory Visit: Payer: Self-pay

## 2019-08-31 ENCOUNTER — Ambulatory Visit (INDEPENDENT_AMBULATORY_CARE_PROVIDER_SITE_OTHER): Payer: Medicare Other | Admitting: Pharmacist

## 2019-08-31 DIAGNOSIS — Z952 Presence of prosthetic heart valve: Secondary | ICD-10-CM

## 2019-08-31 DIAGNOSIS — Z7901 Long term (current) use of anticoagulants: Secondary | ICD-10-CM | POA: Diagnosis not present

## 2019-08-31 LAB — POCT INR: INR: 1.9 — AB (ref 2.0–3.0)

## 2019-09-09 ENCOUNTER — Other Ambulatory Visit: Payer: Self-pay | Admitting: Cardiovascular Disease

## 2019-09-21 ENCOUNTER — Other Ambulatory Visit: Payer: Self-pay

## 2019-09-21 ENCOUNTER — Ambulatory Visit (INDEPENDENT_AMBULATORY_CARE_PROVIDER_SITE_OTHER): Payer: Medicare Other | Admitting: Pharmacist

## 2019-09-21 DIAGNOSIS — Z952 Presence of prosthetic heart valve: Secondary | ICD-10-CM

## 2019-09-21 DIAGNOSIS — Z7901 Long term (current) use of anticoagulants: Secondary | ICD-10-CM

## 2019-09-21 LAB — POCT INR: INR: 1.6 — AB (ref 2.0–3.0)

## 2019-09-30 ENCOUNTER — Telehealth: Payer: Self-pay | Admitting: Cardiovascular Disease

## 2019-09-30 ENCOUNTER — Other Ambulatory Visit: Payer: Self-pay | Admitting: Orthopedic Surgery

## 2019-09-30 DIAGNOSIS — M19012 Primary osteoarthritis, left shoulder: Secondary | ICD-10-CM

## 2019-09-30 NOTE — Telephone Encounter (Signed)
New Message     [facility] Pre-operative Risk Assessment    Request for surgical clearance:  1. What type of surgery is being performed? Left Reverse Total Shoulder   2. When is this surgery scheduled? TBD   3. What type of clearance is required (medical clearance vs. Pharmacy clearance to hold med vs. Both)? Both  4. Are there any medications that need to be held prior to surgery and how long? Blood Thinner   5. Practice name and name of physician performing surgery? Dr. Tamera Punt; [place]  6. What is your office phone number [phone]   7.   What is your office fax number [phone] or [phone]  8.   Anesthesia type (None, local, MAC, general) ? Choice (chosen day of surgery)   Jenkins Rouge 09/30/2019, 2:22 PM  _________________________________________________________________   (provider comments below)

## 2019-09-30 NOTE — Telephone Encounter (Signed)
Patient with diagnosis of mechanical mitral valve and A Fib on warfarin for anticoagulation.    Procedure: Left Reverse Total Shoulder  Date of procedure: TBD  CHADS2-VASc score of  5 (CHF, HTN, AGE x 2, female)  CrCl 17 mL/min.  Scr of 2.19 using KPN  Per office protocol, patient can hold warfarin for 5 days prior to procedure.    Patient will need bridging with Lovenox (enoxaparin) around procedure.  Will need to dose at 1 mg/kg once a day due to reduced renal function.  Recommend acquiring updated weight.   For orthopedic procedures please be sure to resume therapeutic (not prophylactic) dosing.

## 2019-10-03 NOTE — Telephone Encounter (Signed)
   Primary Cardiologist: Sanda Klein, MD  Chart reviewed as part of pre-operative protocol coverage. Left VM for patient to call back.  Reino Bellis, NP 10/03/2019, 11:30 AM

## 2019-10-03 NOTE — Telephone Encounter (Signed)
   Primary Cardiologist: Quay Burow, MD  Chart reviewed as part of pre-operative protocol coverage. Patient was contacted 10/03/2019 in reference to pre-operative risk assessment for pending surgery as outlined below. Has a hx of NICM s/p ICD, MVR with mechanical valve (on coumadin),  [name] was last seen on 07/29/19 by Dr. Gwenlyn Found.  Since that day, [name] has done well from a cardiac standpoint. She has had no anginal symptoms, or shortness of breath. No reported HF symptoms. She is active around her home with ADLs without anginal symptoms.   Therefore, based on ACC/AHA guidelines, the patient would be at acceptable risk for the planned procedure without further cardiovascular testing.   Per Pharmacy recommendations:  CHADS2-VASc score of  5 (CHF, HTN, AGE x 2, female)  CrCl 17 mL/min.  Scr of 2.19 using KPN  Per office protocol, patient can hold warfarin for 5 days prior to procedure.    Patient will need bridging with Lovenox (enoxaparin) around procedure.  Will need to dose at 1 mg/kg once a day due to reduced renal function.   For orthopedic procedures please be sure to resume therapeutic (not prophylactic) dosing.  Have asked the patient to reach out to the coumadin clinic once timing of her procedure is determined in order to arrange for Lovenox bridging.   I will route this recommendation to the requesting party via Epic fax function and remove from pre-op pool.  Please call with questions.  Reino Bellis, NP 10/03/2019, 12:36 PM

## 2019-10-05 ENCOUNTER — Other Ambulatory Visit: Payer: Self-pay

## 2019-10-05 ENCOUNTER — Ambulatory Visit (INDEPENDENT_AMBULATORY_CARE_PROVIDER_SITE_OTHER): Payer: Medicare Other | Admitting: *Deleted

## 2019-10-05 DIAGNOSIS — Z5181 Encounter for therapeutic drug level monitoring: Secondary | ICD-10-CM

## 2019-10-05 DIAGNOSIS — Z7901 Long term (current) use of anticoagulants: Secondary | ICD-10-CM

## 2019-10-05 LAB — POCT INR: INR: 3 (ref 2.0–3.0)

## 2019-10-05 NOTE — Patient Instructions (Signed)
Description   Continue to take 1 tablet daily except 2  tablets on Wednesdays and Fridays. Repeat INR in 3 weeks. Call when procedure is scheduled, [phone]

## 2019-10-10 ENCOUNTER — Other Ambulatory Visit: Payer: Self-pay

## 2019-10-10 ENCOUNTER — Ambulatory Visit
Admission: RE | Admit: 2019-10-10 | Discharge: 2019-10-10 | Disposition: A | Discharge status: Home / Self Care | Payer: Medicare Other | Source: Ambulatory Visit | Attending: Orthopedic Surgery | Admitting: Orthopedic Surgery

## 2019-10-10 DIAGNOSIS — M19012 Primary osteoarthritis, left shoulder: Secondary | ICD-10-CM

## 2019-10-12 ENCOUNTER — Telehealth: Payer: Self-pay

## 2019-10-12 ENCOUNTER — Other Ambulatory Visit: Payer: Self-pay | Admitting: Orthopedic Surgery

## 2019-10-12 NOTE — Telephone Encounter (Signed)
Patient called and left a msg on our machine stating that she needs a lovenox bridge for 10/27/19 for an unspecified procedure. Will route to pharmd pool to advise

## 2019-10-13 NOTE — Telephone Encounter (Signed)
Shoulder surgery on May 27, need to move up next INR check (now scheduled for May 26). Pt will need lovenox bridge.  LMOM for patient to return call.

## 2019-10-13 NOTE — Telephone Encounter (Signed)
Appt change to 10/21/2019 to arrange bridging. Patient to call back if unable to make this appointment.

## 2019-10-19 ENCOUNTER — Encounter: Payer: Self-pay | Admitting: Cardiovascular Disease

## 2019-10-19 NOTE — Progress Notes (Unsigned)
[facility] DEVICE [facility]GRAMM[facility]  Patient Information: Name:  Decarla Siemen  DOB:  1940-08-10  MRN:  [id number]   Planned Procedure: Lt reverse Shoulder Arthroplasty  Surgeon: Dr. Fermin Schwab  Date of Procedure: 10/27/19  Cautery will be used.  Position during surgery:    Please send documentation back to:  [facility] (Fax # [phone])   Device Information:  Clinic EP Physician:  Sanda Klein, MD Device Type:  Defibrillator (VT/VF detection previously turned OFF due to concern for RV lead insulation breach, still functioning as CRT-P) Manufacturer and Phone #:  Medtronic: [id number] Pacemaker Dependent?:  No. (underlying junctional/accelerated junctional rhythm) Date of Last Device Check:  08/23/2019 Normal Device Function?:  Yes.    Electrophysiologist's Recommendations:   Have magnet available.  Provide continuous ECG monitoring when magnet is used or reprogramming is to be performed.   Procedure will likely interfere with device function.  Device should be programmed:  Asynchronous pacing during procedure and returned to normal programming after procedure.  Per Device Clinic [address] [place], [place]  12:02 PM 10/19/2019

## 2019-10-19 NOTE — Patient Instructions (Signed)
DUE TO COVID-19 ONLY ONE VISITOR IS ALLOWED TO COME WITH YOU AND STAY IN THE WAITING ROOM ONLY DURING PRE OP AND PROCEDURE DAY OF SURGERY. THE 2 VISITORS MAY VISIT WITH YOU AFTER SURGERY IN YOUR PRIVATE ROOM DURING VISITING HOURS ONLY!  YOU NEED TO HAVE A COVID 19 TEST ON_5/24______ @_12 :05______, THIS TEST MUST BE DONE BEFORE SURGERY, COME  [address], [name] , [address].  ([place]) ONCE YOUR COVID TEST IS COMPLETED, PLEASE BEGIN THE QUARANTINE INSTRUCTIONS AS OUTLINED IN YOUR HANDOUT.                [name]    Your procedure is scheduled on: 10/27/19   Report to [facility] Main  Entrance   Report to admitting at  7:25 AM     Call this number if you have problems the morning of surgery [place], [place].   Do not eat food After Midnight.   YOU MAY HAVE CLEAR LIQUIDS FROM MIDNIGHT UNTIL 6:30 AM.   At 6:30 AM Please finish the prescribed Pre-Surgery  drink.   Nothing by mouth after you finish the  drink !   Take these medicines the morning of surgery with A SIP OF WATER: Metoprolol, Allopurinol, Synthroid, Wellbutrin, Nexium, use your inhaler and bring it with you to the hospital                                 You may not have any metal on your body including hair pins and              piercings  Do not wear jewelry, make-up, lotions, powders or perfumes, deodorant             Do not wear nail polish on your fingernails.  Do not shave  48 hours prior to surgery.                Do not bring valuables to the hospital. [place].  Contacts, dentures or bridgework may not be worn into surgery.      Patients discharged the day of surgery will not be allowed to drive home.   IF YOU ARE HAVING SURGERY AND GOING HOME THE SAME DAY, YOU MUST HAVE AN ADULT TO DRIVE YOU HOME AND BE WITH YOU FOR 24  HOURS.  YOU MAY GO HOME BY TAXI OR UBER OR ORTHERWISE, BUT AN ADULT MUST ACCOMPANY YOU HOME AND STAY WITH YOU FOR 24 HOURS.  Name and phone number of your driver:  Special Instructions: N/A              Please read over the following fact sheets you were given: _____________________________________________________________________             [facility]- Preparing for Total Shoulder Arthroplasty    Before surgery, you can play an important role. Because skin is not sterile, your skin needs to be as free of germs as possible. You can reduce the number of germs on your skin by using the following products. . Benzoyl Peroxide Gel o Reduces the number of germs present on the skin o Applied twice a day to shoulder area starting two days before surgery    ==================================================================  Please follow these instructions carefully:  BENZOYL PEROXIDE 5% GEL  Please do not use if you have an allergy to benzoyl peroxide.   If your skin becomes reddened/irritated stop using the benzoyl peroxide.  Starting two days before surgery, apply as follows: 1. Apply benzoyl peroxide in the morning and at night. Apply after taking a shower. If you are not taking a shower clean entire shoulder front, back, and side along with the armpit with a clean wet washcloth.  2. Place a quarter-sized dollop on your shoulder and rub in thoroughly, making sure to cover the front, back, and side of your shoulder, along with the armpit.   2 days before ____ AM   ____ PM              1 day before ____ AM   ____ PM                         3. Do this twice a day for two days.  (Last application is the night before surgery, AFTER using the CHG soap as described below).  4. Do NOT apply benzoyl peroxide gel on the day of surgery. 5.  [facility] - Preparing for Surgery  Before surgery, you can play an important role.   Because skin is not sterile, your skin needs to be as free of  germs as possible.   You can reduce the number of germs on your skin by washing with CHG (chlorahexidine gluconate) soap before surgery.   CHG is an antiseptic cleaner which kills germs and bonds with the skin to continue killing germs even after washing. Please DO NOT use if you have an allergy to CHG or antibacterial soaps.   If your skin becomes reddened/irritated stop using the CHG and inform your nurse when you arrive at Short Stay. Do not shave (including legs and underarms) for at least 48 hours prior to the first CHG shower.   Please follow these instructions carefully:  1.  Shower with CHG Soap the night before surgery and the  morning of Surgery.  2.  If you choose to wash your hair, wash your hair first as usual with your  normal  shampoo.  3.  After you shampoo, rinse your hair and body thoroughly to remove the  shampoo.                                        4.  Use CHG as you would any other liquid soap.  You can apply chg directly  to the skin and wash                       Gently with a scrungie or clean washcloth.  5.  Apply the CHG Soap to your body ONLY FROM THE NECK DOWN.   Do not use on face/ open                           Wound or open sores. Avoid contact with eyes, ears mouth and genitals (private parts).                       Wash face,  Genitals (private parts) with your normal soap.             6.  Wash thoroughly, paying special attention to the area where your surgery  will be performed.  7.  Thoroughly rinse your body with warm water from the neck down.  8.  DO NOT shower/wash with your normal soap after using and rinsing off  the CHG Soap.             9.  Pat yourself dry with a clean towel.            10.  Wear clean pajamas.            11.  Place clean sheets on your bed the night of your first shower and do not  sleep with pets. Day of Surgery : Do not apply any lotions/deodorants the morning of surgery.  Please wear clean clothes to the hospital/surgery  center.  FAILURE TO FOLLOW THESE INSTRUCTIONS MAY RESULT IN THE CANCELLATION OF YOUR SURGERY PATIENT SIGNATURE_________________________________  NURSE SIGNATURE__________________________________  ________________________________________________________________________   [name]  An incentive spirometer is a tool that can help keep your lungs clear and active. This tool measures how well you are filling your lungs with each breath. Taking long deep breaths may help reverse or decrease the chance of developing breathing (pulmonary) problems (especially infection) following:  A long period of time when you are unable to move or be active. BEFORE THE PROCEDURE   If the spirometer includes an indicator to show your best effort, your nurse or respiratory therapist will set it to a desired goal.  If possible, sit up straight or lean slightly forward. Try not to slouch.  Hold the incentive spirometer in an upright position. INSTRUCTIONS FOR USE  1. Sit on the edge of your bed if possible, or sit up as far as you can in bed or on a chair. 2. Hold the incentive spirometer in an upright position. 3. Breathe out normally. 4. Place the mouthpiece in your mouth and seal your lips tightly around it. 5. Breathe in slowly and as deeply as possible, raising the piston or the ball toward the top of the column. 6. Hold your breath for 3-5 seconds or for as long as possible. Allow the piston or ball to fall to the bottom of the column. 7. Remove the mouthpiece from your mouth and breathe out normally. 8. Rest for a few seconds and repeat Steps 1 through 7 at least 10 times every 1-2 hours when you are awake. Take your time and take a few normal breaths between deep breaths. 9. The spirometer may include an indicator to show your best effort. Use the indicator as a goal to work toward during each repetition. 10. After each set of 10 deep breaths, practice coughing to be sure your lungs are  clear. If you have an incision (the cut made at the time of surgery), support your incision when coughing by placing a pillow or rolled up towels firmly against it. Once you are able to get out of bed, walk around indoors and cough well. You may stop using the incentive spirometer when instructed by your caregiver.  RISKS AND COMPLICATIONS  Take your time so you do not get dizzy or light-headed.  If you are in pain, you may need to take or ask for pain medication before doing incentive spirometry. It is harder to take a deep breath if you are having pain. AFTER USE  Rest and breathe slowly and easily.  It can be helpful to keep track of a log of your progress. Your caregiver can  provide you with a simple table to help with this. If you are using the spirometer at home, follow these instructions: [place] IF:   You are having difficultly using the spirometer.  You have trouble using the spirometer as often as instructed.  Your pain medication is not giving enough relief while using the spirometer.  You develop fever of 100.5 F (38.1 C) or higher. SEEK IMMEDIATE MEDICAL CARE IF:   You cough up bloody sputum that had not been present before.  You develop fever of 102 F (38.9 C) or greater.  You develop worsening pain at or near the incision site. MAKE SURE YOU:   Understand these instructions.  Will watch your condition.  Will get help right away if you are not doing well or get worse. Document Released: 09/29/2006 Document Revised: 08/11/2011 Document Reviewed: 11/30/2006 [facility] Patient Information 2014 [place], [place].   ________________________________________________________________________

## 2019-10-20 ENCOUNTER — Encounter (HOSPITAL_COMMUNITY): Payer: Self-pay

## 2019-10-20 ENCOUNTER — Encounter (HOSPITAL_COMMUNITY)
Admission: RE | Admit: 2019-10-20 | Discharge: 2019-10-20 | Disposition: A | Discharge status: Home / Self Care | Payer: Medicare Other | Source: Ambulatory Visit | Attending: Internal Medicine | Admitting: Internal Medicine

## 2019-10-20 ENCOUNTER — Other Ambulatory Visit: Payer: Self-pay

## 2019-10-20 ENCOUNTER — Ambulatory Visit (HOSPITAL_COMMUNITY)
Admission: RE | Admit: 2019-10-20 | Discharge: 2019-10-20 | Disposition: A | Discharge status: Home / Self Care | Payer: Medicare Other | Source: Ambulatory Visit | Attending: Orthopedic Surgery | Admitting: Orthopedic Surgery

## 2019-10-20 DIAGNOSIS — Z79899 Other long term (current) drug therapy: Secondary | ICD-10-CM | POA: Diagnosis not present

## 2019-10-20 DIAGNOSIS — N189 Chronic kidney disease, unspecified: Secondary | ICD-10-CM | POA: Diagnosis not present

## 2019-10-20 DIAGNOSIS — Z7901 Long term (current) use of anticoagulants: Secondary | ICD-10-CM | POA: Insufficient documentation

## 2019-10-20 DIAGNOSIS — E785 Hyperlipidemia, unspecified: Secondary | ICD-10-CM | POA: Diagnosis not present

## 2019-10-20 DIAGNOSIS — K219 Gastro-esophageal reflux disease without esophagitis: Secondary | ICD-10-CM | POA: Insufficient documentation

## 2019-10-20 DIAGNOSIS — I129 Hypertensive chronic kidney disease with stage 1 through stage 4 chronic kidney disease, or unspecified chronic kidney disease: Secondary | ICD-10-CM | POA: Insufficient documentation

## 2019-10-20 DIAGNOSIS — M19012 Primary osteoarthritis, left shoulder: Secondary | ICD-10-CM | POA: Diagnosis not present

## 2019-10-20 DIAGNOSIS — Z87891 Personal history of nicotine dependence: Secondary | ICD-10-CM | POA: Insufficient documentation

## 2019-10-20 DIAGNOSIS — E039 Hypothyroidism, unspecified: Secondary | ICD-10-CM | POA: Diagnosis not present

## 2019-10-20 DIAGNOSIS — Z8711 Personal history of peptic ulcer disease: Secondary | ICD-10-CM | POA: Diagnosis not present

## 2019-10-20 DIAGNOSIS — Z01818 Encounter for other preprocedural examination: Secondary | ICD-10-CM | POA: Insufficient documentation

## 2019-10-20 DIAGNOSIS — Z95810 Presence of automatic (implantable) cardiac defibrillator: Secondary | ICD-10-CM | POA: Insufficient documentation

## 2019-10-20 DIAGNOSIS — J449 Chronic obstructive pulmonary disease, unspecified: Secondary | ICD-10-CM | POA: Insufficient documentation

## 2019-10-20 DIAGNOSIS — M797 Fibromyalgia: Secondary | ICD-10-CM | POA: Diagnosis not present

## 2019-10-20 DIAGNOSIS — Z79890 Hormone replacement therapy (postmenopausal): Secondary | ICD-10-CM | POA: Diagnosis not present

## 2019-10-20 DIAGNOSIS — I428 Other cardiomyopathies: Secondary | ICD-10-CM | POA: Insufficient documentation

## 2019-10-20 DIAGNOSIS — Z952 Presence of prosthetic heart valve: Secondary | ICD-10-CM | POA: Diagnosis not present

## 2019-10-20 DIAGNOSIS — Z01811 Encounter for preprocedural respiratory examination: Secondary | ICD-10-CM | POA: Diagnosis not present

## 2019-10-20 LAB — URINALYSIS, ROUTINE W REFLEX MICROSCOPIC
Bilirubin Urine: NEGATIVE
Glucose, UA: NEGATIVE mg/dL
Hgb urine dipstick: NEGATIVE
Ketones, ur: NEGATIVE mg/dL
Nitrite: NEGATIVE
Protein, ur: 30 mg/dL — AB
Specific Gravity, Urine: 1.009 (ref 1.005–1.030)
pH: 6 (ref 5.0–8.0)

## 2019-10-20 LAB — CBC WITH DIFFERENTIAL/PLATELET
Abs Immature Granulocytes: 0.03 10*3/uL (ref 0.00–0.07)
Basophils Absolute: 0 10*3/uL (ref 0.0–0.1)
Basophils Relative: 1 %
Eosinophils Absolute: 0.2 10*3/uL (ref 0.0–0.5)
Eosinophils Relative: 3 %
HCT: 39.9 % (ref 36.0–46.0)
Hemoglobin: 12.8 g/dL (ref 12.0–15.0)
Immature Granulocytes: 0 %
Lymphocytes Relative: 8 %
Lymphs Abs: 0.6 10*3/uL — ABNORMAL LOW (ref 0.7–4.0)
MCH: 31.1 pg (ref 26.0–34.0)
MCHC: 32.1 g/dL (ref 30.0–36.0)
MCV: 97.1 fL (ref 80.0–100.0)
Monocytes Absolute: 0.6 10*3/uL (ref 0.1–1.0)
Monocytes Relative: 8 %
Neutro Abs: 6 10*3/uL (ref 1.7–7.7)
Neutrophils Relative %: 80 %
Platelets: 248 10*3/uL (ref 150–400)
RBC: 4.11 MIL/uL (ref 3.87–5.11)
RDW: 14.4 % (ref 11.5–15.5)
WBC: 7.5 10*3/uL (ref 4.0–10.5)
nRBC: 0 % (ref 0.0–0.2)

## 2019-10-20 LAB — COMPREHENSIVE METABOLIC PANEL
ALT: 17 U/L (ref 0–44)
AST: 31 U/L (ref 15–41)
Albumin: 4.8 g/dL (ref 3.5–5.0)
Alkaline Phosphatase: 74 U/L (ref 38–126)
Anion gap: 11 (ref 5–15)
BUN: 27 mg/dL — ABNORMAL HIGH (ref 8–23)
CO2: 29 mmol/L (ref 22–32)
Calcium: 9.9 mg/dL (ref 8.9–10.3)
Chloride: 97 mmol/L — ABNORMAL LOW (ref 98–111)
Creatinine, Ser: 1.58 mg/dL — ABNORMAL HIGH (ref 0.44–1.00)
GFR calc Af Amer: 36 mL/min — ABNORMAL LOW (ref >60)
GFR calc non Af Amer: 31 mL/min — ABNORMAL LOW (ref >60)
Glucose, Bld: 90 mg/dL (ref 70–99)
Potassium: 4.4 mmol/L (ref 3.5–5.1)
Sodium: 137 mmol/L (ref 135–145)
Total Bilirubin: 0.5 mg/dL (ref 0.3–1.2)
Total Protein: 8.3 g/dL — ABNORMAL HIGH (ref 6.5–8.1)

## 2019-10-20 LAB — SURGICAL PCR SCREEN
MRSA, PCR: NEGATIVE
Staphylococcus aureus: NEGATIVE

## 2019-10-20 LAB — PROTIME-INR
INR: 2 — ABNORMAL HIGH (ref 0.8–1.2)
Prothrombin Time: 21.7 seconds — ABNORMAL HIGH (ref 11.4–15.2)

## 2019-10-20 LAB — ABO/RH: ABO/RH(D): O POS

## 2019-10-20 LAB — APTT: aPTT: 52 seconds — ABNORMAL HIGH (ref 24–36)

## 2019-10-20 NOTE — Progress Notes (Signed)
PCP - Dr. Rogers Seeds Cardiologist - Dr. [name]  Chest x-ray - 10/20/19 EKG - 10/20/19 Stress Test - no ECHO - 08/17/19 Cardiac Cath - 2007  Sleep Study - no CPAP -   Fasting Blood Sugar - NA Checks Blood Sugar _____ times a day  Blood Thinner Instructions:Coumadin Aspirin Instructions:Pt will go to coumadin clinic today 10/20/19 and follow instructions for bridging. Last Dose:  Anesthesia review:   Patient denies shortness of breath, fever, cough and chest pain at PAT appointment yes  Patient verbalized understanding of instructions that were given to them at the PAT appointment. Patient was also instructed that they will need to review over the PAT instructions again at home before surgery. yes

## 2019-10-21 ENCOUNTER — Ambulatory Visit (INDEPENDENT_AMBULATORY_CARE_PROVIDER_SITE_OTHER): Payer: Medicare Other | Admitting: Pharmacist

## 2019-10-21 DIAGNOSIS — Z952 Presence of prosthetic heart valve: Secondary | ICD-10-CM

## 2019-10-21 DIAGNOSIS — Z7901 Long term (current) use of anticoagulants: Secondary | ICD-10-CM | POA: Diagnosis not present

## 2019-10-21 LAB — POCT INR: INR: 2.4 (ref 2.0–3.0)

## 2019-10-21 MED ORDER — ENOXAPARIN SODIUM 40 MG/0.4ML ~~LOC~~ SOLN: 40.0000 mg | SUBCUTANEOUS | 0 refills | Status: DC

## 2019-10-21 NOTE — Patient Instructions (Addendum)
May/21/2021: Last dose of warfarin.  May/22/2021: No warfarinn or enaxoparin.  May/23/2021: Inject enoxaparin 59m in the fatty abdominal tissue at least 2 inches from the belly button daily in the morning. No warfarin.  May/24/2021: Inject enoxaparin in the fatty tissue every mornig. No warfarin.  May/25/2021:  Inject enoxaparin in the fatty tissue every mornig. No warfarin.  May/26/2021:  Inject enoxaparin in the fatty tissue every mornig. No warfarin.  May/27/2021: Procedure Day - No Enoxaparin - Resume warfarin in the evening or as directed by doctor  May/28/2021: Resume enoxaparin inject in the fatty tissue every morning and take warfarin 7.560m  May/29/2021: Inject enoxaparin inject in the fatty tissue every morning and take warfarin 7.69m31mMay/30/2021: Inject enoxaparin inject in the fatty tissue every morning and take warfarin 69mg81may/31/2021: Inject enoxaparin inject in the fatty tissue every morning and take warfarin 69mg.14mune/06/2019 :  Coumadin appt to check INR

## 2019-10-24 ENCOUNTER — Other Ambulatory Visit (HOSPITAL_COMMUNITY)
Admission: RE | Admit: 2019-10-24 | Discharge: 2019-10-24 | Disposition: A | Discharge status: Home / Self Care | Payer: Medicare Other | Source: Ambulatory Visit | Attending: Orthopedic Surgery | Admitting: Orthopedic Surgery

## 2019-10-24 DIAGNOSIS — Z01812 Encounter for preprocedural laboratory examination: Secondary | ICD-10-CM | POA: Insufficient documentation

## 2019-10-24 DIAGNOSIS — Z20822 Contact with and (suspected) exposure to covid-19: Secondary | ICD-10-CM | POA: Diagnosis not present

## 2019-10-24 LAB — SARS CORONAVIRUS 2 (TAT 6-24 HRS): SARS Coronavirus 2: NEGATIVE

## 2019-10-24 NOTE — Anesthesia Preprocedure Evaluation (Addendum)
Anesthesia Evaluation  Patient identified by MRN, date of birth, ID band Patient awake    Reviewed: Allergy & Precautions, NPO status , Patient's Chart, lab work & pertinent test results, reviewed documented beta blocker date and time   History of Anesthesia Complications (+) history of anesthetic complications (reintubated in PACU s/p CCY 2015 seemingly d/t oversedation)  Airway Mallampati: I  TM Distance: >3 FB Neck ROM: Full    Dental no notable dental hx. (+) Upper Dentures, Missing, Dental Advisory Given,    Pulmonary asthma , COPD,  COPD inhaler, former smoker,  Quit smoking 2003 Albuterol- last use 2 years ago   Pulmonary exam normal breath sounds clear to auscultation       Cardiovascular METS: 3 - Mets hypertension, Pt. on home beta blockers and Pt. on medications +CHF  Normal cardiovascular exam+ dysrhythmias (CHB) + pacemaker + Valvular Problems/Murmurs (mild AI) AI  Rhythm:Regular Rate:Normal   s/p mitral valve replacement w/mechanical valve 2007 (on Coumadin), NICM s/p ICD  Being bridged from coumadin with lovenox  CHADSVASC score of 5  Last echo 08/15/19: 1. Difficult acoustic windows LVEF appears mildly depressed with mild lateral hypokinesis . Left ventricular ejection fraction, by estimation, is 45 to 50%. The left ventricle has moderate to severely decreased function. The left ventricle demonstrates regional wall motion abnormalities (see scoring diagram/findings for description). Left ventricular diastolic parameters are consistent with Grade II diastolic dysfunction (pseudonormalization).  2. Right ventricular systolic function is normal. The right ventricular size is normal. There is mildly elevated pulmonary artery systolic pressure.  3. Left atrial size was mildly dilated.  4. Mechanical valve present Peak and mean gradients through the valve are 5 and 3 mm Hg respectively This is similar to previous echo.  There is trivial MR. .  5. The aortic valve is abnormal. Aortic valve regurgitation is mild. Mild aortic valve sclerosis is present, with no evidence of aortic valve stenosis.   ICD disconnected last year d/t concern for RV lead insulation breach    Neuro/Psych PSYCHIATRIC DISORDERS Anxiety Depression negative neurological ROS     GI/Hepatic Neg liver ROS, PUD, GERD  Medicated and Controlled,  Endo/Other  Hypothyroidism   Renal/GU CRFRenal disease  negative genitourinary   Musculoskeletal  (+) Arthritis , Osteoarthritis,  Fibromyalgia -L shoulder OA    Abdominal Normal abdominal exam  (+)   Peds negative pediatric ROS (+)  Hematology negative hematology ROS (+)   Anesthesia Other Findings   Reproductive/Obstetrics negative OB ROS                           Anesthesia Physical Anesthesia Plan  ASA: III  Anesthesia Plan: General and Regional   Post-op Pain Management:    Induction: Intravenous  PONV Risk Score and Plan: 3 and Ondansetron, Dexamethasone and Treatment may vary due to age or medical condition  Airway Management Planned: Oral ETT  Additional Equipment: None  Intra-op Plan:   Post-operative Plan: Extubation in OR  Informed Consent: I have reviewed the patients History and Physical, chart, labs and discussed the procedure including the risks, benefits and alternatives for the proposed anesthesia with the patient or authorized representative who has indicated his/her understanding and acceptance.     Dental advisory given  Plan Discussed with: CRNA  Anesthesia Plan Comments:       Anesthesia Quick Evaluation

## 2019-10-24 NOTE — Progress Notes (Signed)
Anesthesia Chart Review   Case: [id number] Date/Time: 10/27/19 0940   Procedure: REVERSE TOTAL SHOULDER ARTHROPLASTY (Left Shoulder)   Anesthesia type: Choice   Pre-op diagnosis: LEFT SHOULDER ENDSTAGE OSTEOARTHRITIS   Location: [facility] 07 / [facility]   Surgeons: Tania Ade, MD      DISCUSSION:79 y.o. former smoker (quit 06/02/01) with h/o HTN, GERD, HLD, CKD, COPD, s/p mitral valve replacement w/mechanical valve (on Coumadin), NICM s/p ICD (device orders on chart), left shoulder OA scheduled for above procedure 10/27/2019 with Dr. Tania Ade.    Per cardio pre-op risk assessment 10/03/2019, "Chart reviewed as part of pre-operative protocol coverage. Patient was contacted 10/03/2019 in reference to pre-operative risk assessment for pending surgery as outlined below. Has a hx of NICM s/p ICD, MVR with mechanical valve (on coumadin),  [name] was last seen on 07/29/19 by Dr. Gwenlyn Found.  Since that day, [name] has done well from a cardiac standpoint. She has had no anginal symptoms, or shortness of breath. No reported HF symptoms. She is active around her home with ADLs without anginal symptoms.  Therefore, based on ACC/AHA guidelines, the patient would be at acceptable risk for the planned procedure without further cardiovascular testing.  Per Pharmacy recommendations: CHADS2-VASc score of5(CHF, HTN,AGE x 2, female) CrCl17 mL/min. Scr of 2.19 using KPN Per office protocol, patient can holdwarfarinfor 5days prior to procedure.  Patientwillneed bridging with Lovenox (enoxaparin) around procedure. Will need to dose at 1 mg/kg once a day due to reduced renal function.  For orthopedic procedures please be sure to resume therapeutic (not prophylactic) dosing. Have asked the patient to reach out to the coumadin clinic once timing of her procedure is determined in order to arrange for Lovenox bridging."  Instructions on holding Coumadin and Lovenox outlined by pharmacist  in note 10/21/2019.    Anticipate pt can proceed with planned procedure barring acute status change.   VS: There were no vitals taken for this visit.  PROVIDERS: Leanna Battles, MD is PCP  Quay Burow, MD is Cardiologist  LABS: Labs reviewed: Acceptable for surgery. (all labs ordered are listed, but only abnormal results are displayed)  Labs Reviewed  ABO/RH     IMAGES:   EKG: 10/20/2019 Rate 72 bpm AV dual-paced rhythm  No significant change since last tracing   CV: Echo 08/15/2019 IMPRESSIONS    1. Difficult acoustic windows LVEF appears mildly depressed with mild  lateral hypokinesis . Left ventricular ejection fraction, by estimation,  is 45 to 50%. The left ventricle has moderate to severely decreased  function. The left ventricle demonstrates  regional wall motion abnormalities (see scoring diagram/findings for  description). Left ventricular diastolic parameters are consistent with  Grade II diastolic dysfunction (pseudonormalization).  2. Right ventricular systolic function is normal. The right ventricular  size is normal. There is mildly elevated pulmonary artery systolic  pressure.  3. Left atrial size was mildly dilated.  4. Mechanical valve present Peak and mean gradients through the valve are  5 and 3 mm Hg respectively This is similar to previous echo. There is  trivial MR. .  5. The aortic valve is abnormal. Aortic valve regurgitation is mild. Mild  aortic valve sclerosis is present, with no evidence of aortic valve  stenosis.  Past Medical History:  Diagnosis Date  . Allergy    seasonal  . Anemia   . Anxiety   . Arthritis   . Asthma   . 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  Protecta  . Biventricular ICD (implantable cardioverter-defibrillator) in place    with CRT  . Blood transfusion without reported diagnosis   . Bursitis   . Cataract    RIGHT EYE  . CHF (congestive heart failure) ([place])   .  Chronic kidney disease   . Colon polyp    adenomatous  . Complication of anesthesia    patient stated that had difficulty getting the breathing tube removed, patient said that she stopped breathing and HR dropped to 10  patient then woke up and started breathing pateint stated no longer than one minute; re-intubated in PACU following cholecystectomy 10/28/13  . COPD (chronic obstructive pulmonary disease) ([place])   . Depression   . Diverticulosis   . Dysrhythmia   . Fibromyalgia   . GERD (gastroesophageal reflux disease)   . Gout   . H/O mitral valve replacement 2002, 2007  . Heart murmur   . Hemorrhoids   . Hyperlipidemia   . Hypertension   . Hypothyroidism   . IBS (irritable bowel syndrome)   . MVP (mitral valve prolapse)   . Neuromuscular disorder (HCC)    fibromyalgia  . Pacemaker   . Peptic ulcer disease     Past Surgical History:  Procedure Laterality Date  . ABDOMINAL HYSTERECTOMY    . BIV ICD GENERATOR CHANGEOUT N/A 04/29/2017   Procedure: BIV ICD GENERATOR CHANGEOUT;  Surgeon: Sanda Klein, MD;  Location: [facility];  Service: Cardiovascular;  Laterality: N/A;  . CARDIAC CATHETERIZATION  02/25/2006   normal left main, normal LAD, normal L Cfx, normal/dominant RCA (Dr. [name])  . CARDIAC DEFIBRILLATOR PLACEMENT  2007, 11/202012   x2 (pacemaker) (Dr. Jerilynn Mages. Cro[place])  . CARDIAC VALVE REPLACEMENT  2002   MV repair - Dr. Berle Mull  . CHOLECYSTECTOMY N/A 10/28/2013   Procedure: LAPAROSCOPIC CHOLECYSTECTOMY WITH INTRAOPERATIVE CHOLANGIOGRAM;  Surgeon: Edward Jolly, MD;  Location: [facility];  Service: General;  Laterality: N/A;  . COLONOSCOPY    . EYE SURGERY    . IMPLANTABLE CARDIOVERTER DEFIBRILLATOR (ICD) GENERATOR CHANGE N/A 04/10/2011   Procedure: ICD GENERATOR CHANGE;  Surgeon: Sanda Klein, MD;  Location: [facility];  Service: Cardiovascular;  Laterality: N/A;  . INSERT / REPLACE / REMOVE PACEMAKER    . KNEE ARTHROSCOPY    . MITRAL VALVE REPLACEMENT   02/26/2006   re-do MVR w/90m St. [name] (Dr. BEllison Hughs  . NM MYOCAR PERF WALL MOTION  2005   persantine myoview - low ris, EF 63%  . RIGHT HEART CATH  04/03/2006   pulm cap wedge pressure 24/24, PA pressure 43/22 (mean 366mg), CO 4.8, CI 4.1 (Dr. J.Jackie Plum . TOTAL SHOULDER ARTHROPLASTY Right 12/27/2015   Procedure: RIGHT TOTAL SHOULDER ARTHROPLASTY;  Surgeon: JuTania AdeMD;  [facility] Service: [facility];  Laterality: Right;  Right total shoulder arthroplasty  . TRANSTHORACIC ECHOCARDIOGRAM  12/2011   EF 50-55%, mild global hypokinesis; LA severely dilated; calcification of anterior/posterior MV leaflets, bi-leaflets St. [name] mechanical MV; mild TR; trace AV regurg/pulm valve regurg    MEDICATIONS: . acetaminophen (TYLENOL) 500 MG tablet  . allopurinol (ZYLOPRIM) 300 MG tablet  . ALPRAZolam (XANAX) 0.25 MG tablet  . buPROPion (WELLBUTRIN) 100 MG tablet  . enoxaparin (LOVENOX) 40 MG/0.4ML injection  . esomeprazole (NEXIUM) 20 MG capsule  . furosemide (LASIX) 40 MG tablet  . hydroxychloroquine (PLAQUENIL) 200 MG tablet  . metoprolol succinate (TOPROL-XL) 25 MG 24 hr tablet  . pravastatin (PRAVACHOL) 40 MG tablet  . PROAIR  HFA 108 (90 BASE) MCG/ACT inhaler  . Sennosides (SENNA) 15 MG TABS  . spironolactone (ALDACTONE) 25 MG tablet  . SYNTHROID 50 MCG tablet  . warfarin (COUMADIN) 2.5 MG tablet   . 0.9 %  sodium chloride infusion     [facility] Pre-Surgical Testing [phone] 10/24/19  3:54 PM

## 2019-10-26 ENCOUNTER — Encounter (HOSPITAL_COMMUNITY): Payer: Self-pay | Admitting: Orthopedic Surgery

## 2019-10-27 ENCOUNTER — Encounter (HOSPITAL_COMMUNITY)
Admission: RE | Disposition: A | Discharge status: Home / Self Care | Payer: Self-pay | Source: Home / Self Care | Attending: Orthopedic Surgery

## 2019-10-27 ENCOUNTER — Encounter (HOSPITAL_COMMUNITY): Payer: Self-pay | Admitting: Orthopedic Surgery

## 2019-10-27 ENCOUNTER — Ambulatory Visit (HOSPITAL_COMMUNITY): Payer: Medicare Other | Admitting: Anesthesiology

## 2019-10-27 ENCOUNTER — Other Ambulatory Visit: Payer: Self-pay

## 2019-10-27 ENCOUNTER — Ambulatory Visit (HOSPITAL_COMMUNITY): Payer: Medicare Other | Admitting: Physician Assistant

## 2019-10-27 ENCOUNTER — Observation Stay (HOSPITAL_COMMUNITY)
Admission: RE | Admit: 2019-10-27 | Discharge: 2019-10-28 | Disposition: A | Discharge status: Home / Self Care | Payer: Medicare Other | Attending: Orthopedic Surgery | Admitting: Orthopedic Surgery

## 2019-10-27 ENCOUNTER — Observation Stay (HOSPITAL_COMMUNITY): Payer: Medicare Other

## 2019-10-27 DIAGNOSIS — M19012 Primary osteoarthritis, left shoulder: Principal | ICD-10-CM | POA: Insufficient documentation

## 2019-10-27 DIAGNOSIS — M797 Fibromyalgia: Secondary | ICD-10-CM | POA: Insufficient documentation

## 2019-10-27 DIAGNOSIS — Z79899 Other long term (current) drug therapy: Secondary | ICD-10-CM | POA: Diagnosis not present

## 2019-10-27 DIAGNOSIS — F419 Anxiety disorder, unspecified: Secondary | ICD-10-CM | POA: Diagnosis not present

## 2019-10-27 DIAGNOSIS — J449 Chronic obstructive pulmonary disease, unspecified: Secondary | ICD-10-CM | POA: Diagnosis not present

## 2019-10-27 DIAGNOSIS — Z96612 Presence of left artificial shoulder joint: Secondary | ICD-10-CM

## 2019-10-27 DIAGNOSIS — Z7901 Long term (current) use of anticoagulants: Secondary | ICD-10-CM | POA: Diagnosis not present

## 2019-10-27 DIAGNOSIS — Z79890 Hormone replacement therapy (postmenopausal): Secondary | ICD-10-CM | POA: Insufficient documentation

## 2019-10-27 DIAGNOSIS — F329 Major depressive disorder, single episode, unspecified: Secondary | ICD-10-CM | POA: Diagnosis not present

## 2019-10-27 DIAGNOSIS — I130 Hypertensive heart and chronic kidney disease with heart failure and stage 1 through stage 4 chronic kidney disease, or unspecified chronic kidney disease: Secondary | ICD-10-CM | POA: Diagnosis not present

## 2019-10-27 DIAGNOSIS — Z95810 Presence of automatic (implantable) cardiac defibrillator: Secondary | ICD-10-CM | POA: Diagnosis not present

## 2019-10-27 DIAGNOSIS — D62 Acute posthemorrhagic anemia: Secondary | ICD-10-CM | POA: Diagnosis not present

## 2019-10-27 DIAGNOSIS — E039 Hypothyroidism, unspecified: Secondary | ICD-10-CM | POA: Diagnosis not present

## 2019-10-27 DIAGNOSIS — E785 Hyperlipidemia, unspecified: Secondary | ICD-10-CM | POA: Insufficient documentation

## 2019-10-27 DIAGNOSIS — I509 Heart failure, unspecified: Secondary | ICD-10-CM | POA: Diagnosis not present

## 2019-10-27 DIAGNOSIS — Z952 Presence of prosthetic heart valve: Secondary | ICD-10-CM | POA: Diagnosis not present

## 2019-10-27 DIAGNOSIS — Z87891 Personal history of nicotine dependence: Secondary | ICD-10-CM | POA: Diagnosis not present

## 2019-10-27 DIAGNOSIS — Z885 Allergy status to narcotic agent status: Secondary | ICD-10-CM | POA: Diagnosis not present

## 2019-10-27 HISTORY — PX: TOTAL SHOULDER ARTHROPLASTY: SHX126

## 2019-10-27 LAB — TYPE AND SCREEN
ABO/RH(D): O POS
Antibody Screen: NEGATIVE

## 2019-10-27 SURGERY — ARTHROPLASTY, SHOULDER, TOTAL
Anesthesia: Regional | Site: Shoulder | Laterality: Left

## 2019-10-27 MED ORDER — TRANEXAMIC ACID-NACL 1000-0.7 MG/100ML-% IV SOLN
1000.0000 mg | INTRAVENOUS | Status: AC
  Administered 2019-10-27: 1000 mg via INTRAVENOUS
  Filled 2019-10-27: qty 100

## 2019-10-27 MED ORDER — ROPIVACAINE HCL 5 MG/ML IJ SOLN
INTRAMUSCULAR | Status: DC | PRN
  Administered 2019-10-27: 20 mL via PERINEURAL

## 2019-10-27 MED ORDER — PROPOFOL 10 MG/ML IV BOLUS
INTRAVENOUS | Status: DC | PRN
  Administered 2019-10-27: 100 mg via INTRAVENOUS

## 2019-10-27 MED ORDER — ALUM & MAG HYDROXIDE-SIMETH 200-200-20 MG/5ML PO SUSP: 30.0000 mL | ORAL | Status: DC | PRN

## 2019-10-27 MED ORDER — SODIUM CHLORIDE 0.9 % IR SOLN
Status: DC | PRN
  Administered 2019-10-27: 1000 mL

## 2019-10-27 MED ORDER — PANTOPRAZOLE SODIUM 40 MG PO TBEC
40.0000 mg | DELAYED_RELEASE_TABLET | Freq: Every day | ORAL | Status: DC
  Administered 2019-10-28: 40 mg via ORAL
  Filled 2019-10-27: qty 1

## 2019-10-27 MED ORDER — WARFARIN SODIUM 5 MG PO TABS: 5.0000 mg | ORAL_TABLET | Freq: Every day | ORAL | Status: DC

## 2019-10-27 MED ORDER — ROCURONIUM BROMIDE 10 MG/ML (PF) SYRINGE
PREFILLED_SYRINGE | INTRAVENOUS | Status: DC | PRN
  Administered 2019-10-27: 50 mg via INTRAVENOUS

## 2019-10-27 MED ORDER — SPIRONOLACTONE 25 MG PO TABS
25.0000 mg | ORAL_TABLET | Freq: Every day | ORAL | Status: DC
  Filled 2019-10-27: qty 1

## 2019-10-27 MED ORDER — HYDROCODONE-ACETAMINOPHEN 7.5-325 MG PO TABS: 1.0000 | ORAL_TABLET | ORAL | Status: DC | PRN

## 2019-10-27 MED ORDER — BUPROPION HCL 100 MG PO TABS
100.0000 mg | ORAL_TABLET | Freq: Every day | ORAL | Status: DC
  Administered 2019-10-28: 100 mg via ORAL
  Filled 2019-10-27: qty 1

## 2019-10-27 MED ORDER — FLEET ENEMA 7-19 GM/118ML RE ENEM: 1.0000 | ENEMA | Freq: Once | RECTAL | Status: DC | PRN

## 2019-10-27 MED ORDER — ACETAMINOPHEN 500 MG PO TABS
500.0000 mg | ORAL_TABLET | Freq: Four times a day (QID) | ORAL | Status: DC
  Administered 2019-10-27 – 2019-10-28 (×3): 500 mg via ORAL
  Filled 2019-10-27 (×3): qty 1

## 2019-10-27 MED ORDER — ONDANSETRON HCL 4 MG PO TABS: 4.0000 mg | ORAL_TABLET | Freq: Four times a day (QID) | ORAL | Status: DC | PRN

## 2019-10-27 MED ORDER — DOCUSATE SODIUM 100 MG PO CAPS
100.0000 mg | ORAL_CAPSULE | Freq: Two times a day (BID) | ORAL | Status: DC
  Administered 2019-10-27 – 2019-10-28 (×2): 100 mg via ORAL
  Filled 2019-10-27 (×2): qty 1

## 2019-10-27 MED ORDER — SUGAMMADEX SODIUM 200 MG/2ML IV SOLN
INTRAVENOUS | Status: DC | PRN
  Administered 2019-10-27: 150 mg via INTRAVENOUS

## 2019-10-27 MED ORDER — FENTANYL CITRATE (PF) 100 MCG/2ML IJ SOLN
50.0000 ug | INTRAMUSCULAR | Status: AC
  Administered 2019-10-27 (×2): 50 ug via INTRAVENOUS
  Filled 2019-10-27: qty 2

## 2019-10-27 MED ORDER — DIPHENHYDRAMINE HCL 12.5 MG/5ML PO ELIX: 12.5000 mg | ORAL_SOLUTION | ORAL | Status: DC | PRN

## 2019-10-27 MED ORDER — ONDANSETRON HCL 4 MG/2ML IJ SOLN
INTRAMUSCULAR | Status: DC | PRN
  Administered 2019-10-27: 4 mg via INTRAVENOUS

## 2019-10-27 MED ORDER — PHENYLEPHRINE HCL-NACL 10-0.9 MG/250ML-% IV SOLN
INTRAVENOUS | Status: DC | PRN
  Administered 2019-10-27: 65 ug/min via INTRAVENOUS

## 2019-10-27 MED ORDER — HYDROXYCHLOROQUINE SULFATE 200 MG PO TABS
100.0000 mg | ORAL_TABLET | Freq: Every day | ORAL | Status: DC
  Administered 2019-10-27: 100 mg via ORAL
  Filled 2019-10-27: qty 0.5

## 2019-10-27 MED ORDER — POLYETHYLENE GLYCOL 3350 17 G PO PACK: 17.0000 g | PACK | Freq: Every day | ORAL | Status: DC | PRN

## 2019-10-27 MED ORDER — BUPIVACAINE LIPOSOME 1.3 % IJ SUSP
INTRAMUSCULAR | Status: DC | PRN
  Administered 2019-10-27: 10 mL via PERINEURAL

## 2019-10-27 MED ORDER — METOPROLOL SUCCINATE ER 25 MG PO TB24
37.5000 mg | ORAL_TABLET | Freq: Every day | ORAL | Status: DC
  Filled 2019-10-27: qty 2

## 2019-10-27 MED ORDER — ALPRAZOLAM 0.25 MG PO TABS: 0.2500 mg | ORAL_TABLET | Freq: Every evening | ORAL | Status: DC | PRN

## 2019-10-27 MED ORDER — METOCLOPRAMIDE HCL 5 MG/ML IJ SOLN: 5.0000 mg | Freq: Three times a day (TID) | INTRAMUSCULAR | Status: DC | PRN

## 2019-10-27 MED ORDER — ACETAMINOPHEN 325 MG PO TABS: 325.0000 mg | ORAL_TABLET | Freq: Four times a day (QID) | ORAL | Status: DC | PRN

## 2019-10-27 MED ORDER — WATER FOR IRRIGATION, STERILE IR SOLN
Status: DC | PRN
  Administered 2019-10-27 (×2): 1000 mL

## 2019-10-27 MED ORDER — SODIUM CHLORIDE 0.9 % IV SOLN
INTRAVENOUS | Status: DC | PRN
  Administered 2019-10-27: 1000 mL

## 2019-10-27 MED ORDER — CEFAZOLIN SODIUM-DEXTROSE 1-4 GM/50ML-% IV SOLN
1.0000 g | Freq: Four times a day (QID) | INTRAVENOUS | Status: AC
  Administered 2019-10-27 – 2019-10-28 (×3): 1 g via INTRAVENOUS
  Filled 2019-10-27 (×3): qty 50

## 2019-10-27 MED ORDER — PHENYLEPHRINE 40 MCG/ML (10ML) SYRINGE FOR IV PUSH (FOR BLOOD PRESSURE SUPPORT)
PREFILLED_SYRINGE | INTRAVENOUS | Status: DC | PRN
  Administered 2019-10-27: 120 ug via INTRAVENOUS

## 2019-10-27 MED ORDER — LEVOTHYROXINE SODIUM 50 MCG PO TABS
50.0000 ug | ORAL_TABLET | Freq: Every day | ORAL | Status: DC
  Administered 2019-10-28: 50 ug via ORAL
  Filled 2019-10-27: qty 1

## 2019-10-27 MED ORDER — LACTATED RINGERS IV SOLN: INTRAVENOUS | Status: DC

## 2019-10-27 MED ORDER — MORPHINE SULFATE (PF) 4 MG/ML IV SOLN: 0.5000 mg | INTRAVENOUS | Status: DC | PRN

## 2019-10-27 MED ORDER — LIDOCAINE 2% (20 MG/ML) 5 ML SYRINGE
INTRAMUSCULAR | Status: DC | PRN
  Administered 2019-10-27: 20 mg via INTRAVENOUS

## 2019-10-27 MED ORDER — ENOXAPARIN SODIUM 40 MG/0.4ML ~~LOC~~ SOLN
40.0000 mg | Freq: Every day | SUBCUTANEOUS | Status: DC
  Administered 2019-10-28: 40 mg via SUBCUTANEOUS
  Filled 2019-10-27: qty 0.4

## 2019-10-27 MED ORDER — METOCLOPRAMIDE HCL 5 MG PO TABS: 5.0000 mg | ORAL_TABLET | Freq: Three times a day (TID) | ORAL | Status: DC | PRN

## 2019-10-27 MED ORDER — CEFAZOLIN SODIUM-DEXTROSE 2-4 GM/100ML-% IV SOLN
2.0000 g | INTRAVENOUS | Status: AC
  Administered 2019-10-27: 2 g via INTRAVENOUS
  Filled 2019-10-27: qty 100

## 2019-10-27 MED ORDER — WARFARIN SODIUM 5 MG PO TABS
7.5000 mg | ORAL_TABLET | Freq: Every day | ORAL | Status: DC
  Administered 2019-10-27: 7.5 mg via ORAL
  Filled 2019-10-27: qty 1

## 2019-10-27 MED ORDER — SODIUM CHLORIDE 0.9 % IV SOLN: INTRAVENOUS | Status: DC

## 2019-10-27 MED ORDER — PRAVASTATIN SODIUM 40 MG PO TABS
40.0000 mg | ORAL_TABLET | Freq: Every day | ORAL | Status: DC
  Administered 2019-10-27: 40 mg via ORAL
  Filled 2019-10-27: qty 2
  Filled 2019-10-27: qty 1

## 2019-10-27 MED ORDER — PHENOL 1.4 % MT LIQD: 1.0000 | OROMUCOSAL | Status: DC | PRN

## 2019-10-27 MED ORDER — ONDANSETRON HCL 4 MG/2ML IJ SOLN: 4.0000 mg | Freq: Four times a day (QID) | INTRAMUSCULAR | Status: DC | PRN

## 2019-10-27 MED ORDER — SENNOSIDES 8.8 MG/5ML PO SYRP
15.0000 mg | ORAL_SOLUTION | Freq: Every day | ORAL | Status: DC
  Administered 2019-10-27: 15 mg via ORAL
  Filled 2019-10-27: qty 10

## 2019-10-27 MED ORDER — ALBUTEROL SULFATE (2.5 MG/3ML) 0.083% IN NEBU: 3.0000 mL | INHALATION_SOLUTION | Freq: Four times a day (QID) | RESPIRATORY_TRACT | Status: DC | PRN

## 2019-10-27 MED ORDER — MIDAZOLAM HCL 2 MG/2ML IJ SOLN
1.0000 mg | INTRAMUSCULAR | Status: DC
  Filled 2019-10-27: qty 2

## 2019-10-27 MED ORDER — WARFARIN - PHYSICIAN DOSING INPATIENT: Freq: Every day | Status: DC

## 2019-10-27 MED ORDER — DEXAMETHASONE SODIUM PHOSPHATE 10 MG/ML IJ SOLN
INTRAMUSCULAR | Status: DC | PRN
  Administered 2019-10-27: 5 mg via INTRAVENOUS

## 2019-10-27 MED ORDER — HYDROCODONE-ACETAMINOPHEN 5-325 MG PO TABS: 1.0000 | ORAL_TABLET | ORAL | Status: DC | PRN

## 2019-10-27 MED ORDER — BISACODYL 5 MG PO TBEC: 5.0000 mg | DELAYED_RELEASE_TABLET | Freq: Every day | ORAL | Status: DC | PRN

## 2019-10-27 MED ORDER — ALLOPURINOL 300 MG PO TABS: 300.0000 mg | ORAL_TABLET | Freq: Every evening | ORAL | Status: DC

## 2019-10-27 MED ORDER — MENTHOL 3 MG MT LOZG: 1.0000 | LOZENGE | OROMUCOSAL | Status: DC | PRN

## 2019-10-27 SURGICAL SUPPLY — 75 items
BAG ZIPLOCK 12X15 (MISCELLANEOUS) ×3 IMPLANT
BASEPLATE P2 COATD GLND 6.5X30 (Shoulder) ×1 IMPLANT
BIT DRILL 1.6MX128 (BIT) IMPLANT
BIT DRILL 1.6MX128MM (BIT)
BIT DRILL 2.5 DIA 127 CALI (BIT) ×3 IMPLANT
BIT DRILL 4 DIA CALIBRATED (BIT) ×3 IMPLANT
BLADE SAW SAG 73X25 THK (BLADE) ×2
BLADE SAW SGTL 73X25 THK (BLADE) ×1 IMPLANT
CEMENT BONE DEPUY (Cement) ×3 IMPLANT
CLOSURE WOUND 1/2 X4 (GAUZE/BANDAGES/DRESSINGS) ×1
COOLER ICEMAN CLASSIC (MISCELLANEOUS) ×3 IMPLANT
COVER BACK TABLE 60X90IN (DRAPES) ×3 IMPLANT
COVER SURGICAL LIGHT HANDLE (MISCELLANEOUS) ×3 IMPLANT
COVER WAND RF STERILE (DRAPES) IMPLANT
DRAPE INCISE IOBAN 66X45 STRL (DRAPES) ×3 IMPLANT
DRAPE ORTHO SPLIT 77X108 STRL (DRAPES)
DRAPE POUCH INSTRU U-SHP 10X18 (DRAPES) ×3 IMPLANT
DRAPE SHEET LG 3/4 BI-LAMINATE (DRAPES) IMPLANT
DRAPE SURG 17X11 SM STRL (DRAPES) ×3 IMPLANT
DRAPE SURG ORHT 6 SPLT 77X108 (DRAPES) IMPLANT
DRAPE U-SHAPE 47X51 STRL (DRAPES) ×3 IMPLANT
DRSG AQUACEL AG ADV 3.5X 6 (GAUZE/BANDAGES/DRESSINGS) ×3 IMPLANT
DURAPREP 26ML APPLICATOR (WOUND CARE) ×3 IMPLANT
ELECT BLADE TIP CTD 4 INCH (ELECTRODE) ×3 IMPLANT
ELECT REM PT RETURN 15FT ADLT (MISCELLANEOUS) ×3 IMPLANT
GLOVE BIO SURGEON STRL SZ7 (GLOVE) ×3 IMPLANT
GLOVE BIO SURGEON STRL SZ7.5 (GLOVE) ×3 IMPLANT
GLOVE BIOGEL PI IND STRL 7.0 (GLOVE) ×1 IMPLANT
GLOVE BIOGEL PI IND STRL 8 (GLOVE) ×1 IMPLANT
GLOVE BIOGEL PI INDICATOR 7.0 (GLOVE) ×2
GLOVE BIOGEL PI INDICATOR 8 (GLOVE) ×2
GOWN STRL REUS W/TWL LRG LVL3 (GOWN DISPOSABLE) ×6 IMPLANT
GOWN STRL REUS W/TWL XL LVL3 (GOWN DISPOSABLE) ×3 IMPLANT
HANDPIECE INTERPULSE COAX TIP (DISPOSABLE) ×3
HEMOSTAT SURGICEL 2X14 (HEMOSTASIS) ×3 IMPLANT
HOOD PEEL AWAY FLYTE STAYCOOL (MISCELLANEOUS) ×9 IMPLANT
INSERT SMALL SOCKET 32MM (Insert) ×3 IMPLANT
KIT BASIN (CUSTOM PROCEDURE TRAY) ×3 IMPLANT
KIT TURNOVER KIT A (KITS) IMPLANT
MANIFOLD NEPTUNE II (INSTRUMENTS) ×3 IMPLANT
NEEDLE TROCAR POINT SZ 2 1/2 (NEEDLE) ×3 IMPLANT
NS IRRIG 1000ML POUR BTL (IV SOLUTION) ×3 IMPLANT
P2 COATDE GLNOID BSEPLT 6.5X30 (Shoulder) ×3 IMPLANT
PACK SHOULDER (CUSTOM PROCEDURE TRAY) ×3 IMPLANT
PAD COLD SHLDR WRAP-ON (PAD) ×3 IMPLANT
PROTECTOR NERVE ULNAR (MISCELLANEOUS) IMPLANT
RESTRAINT HEAD UNIVERSAL NS (MISCELLANEOUS) ×3 IMPLANT
RETRIEVER SUT HEWSON (MISCELLANEOUS) IMPLANT
SCREW BONE LOCKING RSP 5.0X30 (Screw) ×3 IMPLANT
SCREW BONE RSP LOCK 5X18 (Screw) ×1 IMPLANT
SCREW BONE RSP LOCK 5X22 (Screw) ×2 IMPLANT
SCREW BONE RSP LOCK 5X30 (Screw) ×1 IMPLANT
SCREW BONE RSP LOCKING 18MM LG (Screw) ×3 IMPLANT
SCREW BONE RSP LOCKING 5.0X32 (Screw) ×6 IMPLANT
SCREW RETAIN W/HEAD 4MM OFFSET (Shoulder) ×3 IMPLANT
SET HNDPC FAN SPRY TIP SCT (DISPOSABLE) ×1 IMPLANT
SLING ARM FOAM STRAP SML (SOFTGOODS) ×3 IMPLANT
SLING ARM IMMOBILIZER LRG (SOFTGOODS) IMPLANT
SMARTMIX MINI TOWER (MISCELLANEOUS) ×3
SPONGE LAP 18X18 RF (DISPOSABLE) ×3 IMPLANT
STEM HUMERAL REV SHL 6X108 SM (Stem) ×3 IMPLANT
STRIP CLOSURE SKIN 1/2X4 (GAUZE/BANDAGES/DRESSINGS) ×2 IMPLANT
SUCTION FRAZIER HANDLE 12FR (TUBING) ×3
SUCTION TUBE FRAZIER 12FR DISP (TUBING) ×1 IMPLANT
SUPPORT WRAP ARM LG (MISCELLANEOUS) ×3 IMPLANT
SUT ETHIBOND 2 V 37 (SUTURE) ×3 IMPLANT
SUT MNCRL AB 4-0 PS2 18 (SUTURE) ×3 IMPLANT
SUT VIC AB 2-0 CT1 27 (SUTURE) ×3
SUT VIC AB 2-0 CT1 TAPERPNT 27 (SUTURE) ×1 IMPLANT
TAPE LABRALWHITE 1.5X36 (TAPE) ×3 IMPLANT
TAPE SUT LABRALTAP WHT/BLK (SUTURE) ×3 IMPLANT
TOWEL OR 17X26 10 PK STRL BLUE (TOWEL DISPOSABLE) ×3 IMPLANT
TOWER SMARTMIX MINI (MISCELLANEOUS) ×1 IMPLANT
WATER STERILE IRR 1000ML POUR (IV SOLUTION) ×3 IMPLANT
YANKAUER SUCT BULB TIP 10FT TU (MISCELLANEOUS) ×3 IMPLANT

## 2019-10-27 NOTE — H&P (Signed)
[name] is an 79 y.o. female.   Chief Complaint: L shoulder pain and dysfunction HPI: Endstage L shoulder arthritis with significant pain and dysfunction with severe rotator cuff disease, failed conservative measures.  Pain interferes with sleep and quality of life.   Past Medical History:  Diagnosis Date  . Allergy    seasonal  . Anemia   . Anxiety   . Arthritis   . Asthma   . 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 Protecta  . Biventricular ICD (implantable cardioverter-defibrillator) in place    with CRT  . Blood transfusion without reported diagnosis   . Bursitis   . Cataract    RIGHT EYE  . CHF (congestive heart failure) ([place])   . Chronic kidney disease   . Colon polyp    adenomatous  . Complication of anesthesia    patient stated that had difficulty getting the breathing tube removed, patient said that she stopped breathing and HR dropped to 10  patient then woke up and started breathing pateint stated no longer than one minute; re-intubated in PACU following cholecystectomy 10/28/13  . COPD (chronic obstructive pulmonary disease) ([place])   . Depression   . Diverticulosis   . Dysrhythmia   . Fibromyalgia   . GERD (gastroesophageal reflux disease)   . Gout   . H/O mitral valve replacement 2002, 2007  . Heart murmur   . Hemorrhoids   . Hyperlipidemia   . Hypertension   . Hypothyroidism   . IBS (irritable bowel syndrome)   . MVP (mitral valve prolapse)   . Neuromuscular disorder (HCC)    fibromyalgia  . Pacemaker   . Peptic ulcer disease     Past Surgical History:  Procedure Laterality Date  . ABDOMINAL HYSTERECTOMY    . BIV ICD GENERATOR CHANGEOUT N/A 04/29/2017   Procedure: BIV ICD GENERATOR CHANGEOUT;  Surgeon: Sanda Klein, MD;  Location: [facility];  Service: Cardiovascular;  Laterality: N/A;  . CARDIAC CATHETERIZATION  02/25/2006   normal left main, normal LAD, normal L Cfx, normal/dominant RCA (Dr. [name])  . CARDIAC DEFIBRILLATOR PLACEMENT  2007, 11/202012   x2 (pacemaker) (Dr. Jerilynn Mages. Cro[place])  . CARDIAC VALVE REPLACEMENT  2002   MV repair - Dr. Berle Mull  . CHOLECYSTECTOMY N/A 10/28/2013   Procedure: LAPAROSCOPIC CHOLECYSTECTOMY WITH INTRAOPERATIVE CHOLANGIOGRAM;  Surgeon: Edward Jolly, MD;  Location: [facility];  Service: General;  Laterality: N/A;  . COLONOSCOPY    . EYE SURGERY    . IMPLANTABLE CARDIOVERTER DEFIBRILLATOR (ICD) GENERATOR CHANGE N/A 04/10/2011   Procedure: ICD GENERATOR CHANGE;  Surgeon: Sanda Klein, MD;  Location: [facility];  Service: Cardiovascular;  Laterality: N/A;  . INSERT / REPLACE / REMOVE PACEMAKER    . KNEE ARTHROSCOPY    . MITRAL VALVE REPLACEMENT  02/26/2006   re-do MVR w/48m St. [name] (Dr. BEllison Hughs  . NM MYOCAR PERF WALL MOTION  2005   persantine myoview - low ris, EF 63%  . RIGHT HEART CATH  04/03/2006   pulm cap wedge pressure 24/24, PA pressure 43/22 (mean 319mg), CO 4.8, CI 4.1 (Dr. J.Jackie Plum . TOTAL SHOULDER ARTHROPLASTY Right 12/27/2015   Procedure: RIGHT TOTAL SHOULDER ARTHROPLASTY;  Surgeon: JuTania AdeMD;  [facility] Service: [facility];  Laterality: Right;  Right total shoulder arthroplasty  . TRANSTHORACIC ECHOCARDIOGRAM  12/2011   EF 50-55%, mild global hypokinesis; LA severely dilated; calcification of anterior/posterior MV leaflets, bi-leaflets St. [name] mechanical MV; mild  TR; trace AV regurg/pulm valve regurg    Family History  Problem Relation Age of Onset  . Breast cancer Sister        multiple  . Uterine cancer Sister        multiple  . Heart disease Mother   . Heart attack Mother   . Heart disease Father   . Kidney disease Father   . Heart attack Father   . Heart disease Sister   . Crohn's disease Sister   . Heart disease Son   . Colon cancer Neg Hx   . Stomach cancer Neg Hx    Social History:  reports that she quit smoking about 18 years ago. Her smoking use included cigarettes. She has never used  smokeless tobacco. She reports current alcohol use. She reports that she does not use drugs.  Allergies:  Allergies  Allergen Reactions  . Ace Inhibitors Cough  . Lipitor [Atorvastatin Calcium] Other (See Comments)    Knee pain.  . Codeine Nausea And Vomiting    Facility-Administered Medications Prior to Admission  Medication Dose Route Frequency Provider Last Rate Last Admin  . 0.9 %  sodium chloride infusion  500 mL Intravenous Continuous Irene Shipper, MD       Medications Prior to Admission  Medication Sig Dispense Refill  . acetaminophen (TYLENOL) 500 MG tablet Take 250 mg by mouth every 6 (six) hours as needed for moderate pain or headache.    . allopurinol (ZYLOPRIM) 300 MG tablet Take 300 mg by mouth every evening.     [name] ALPRAZolam (XANAX) 0.25 MG tablet Take 0.25 mg by mouth at bedtime as needed for anxiety or sleep.     [name] buPROPion (WELLBUTRIN) 100 MG tablet Take 100 mg by mouth daily.  12  . esomeprazole (NEXIUM) 20 MG capsule Take 20 mg by mouth daily at 12 noon.    . furosemide (LASIX) 40 MG tablet TAKE 1 TABLET BY MOUTH DAILY. **NEEDS OV FOR FUTURE REFILLS** (Patient taking differently: Take 40 mg by mouth daily. ) 90 tablet 2  . hydroxychloroquine (PLAQUENIL) 200 MG tablet Take 100 mg by mouth at bedtime.     . metoprolol succinate (TOPROL-XL) 25 MG 24 hr tablet Take 1.5 tablets (37.5 mg total) by mouth daily. 135 tablet 3  . pravastatin (PRAVACHOL) 40 MG tablet Take 40 mg at bedtime by mouth.     [name] PROAIR HFA 108 (90 BASE) MCG/ACT inhaler Inhale 1-2 puffs into the lungs every 6 (six) hours as needed for wheezing.     . Sennosides (SENNA) 15 MG TABS Take 15 mg by mouth at bedtime.     [name] spironolactone (ALDACTONE) 25 MG tablet Take 1 tablet (25 mg total) by mouth daily. 90 tablet 3  . SYNTHROID 50 MCG tablet Take 50 mcg by mouth daily.  2  . warfarin (COUMADIN) 2.5 MG tablet TAKE 1 TO 1 & 1/2 TABLETS BY MOUTH DAILY AS DIRECTED BY COUMADIN CLINIC (Patient taking  differently: Take 2.5-5 mg by mouth See admin instructions. Take 5 mg in the evening on Wed and Fri, Take 2.5 mg in the evening on Sun, Mon, Tue, Thurs, and Sat) 135 tablet 1  . enoxaparin (LOVENOX) 40 MG/0.4ML injection Inject 0.4 mLs (40 mg total) into the skin daily. 4 mL 0    No results found for this or any previous visit (from the past 48 hour(s)). No results found.  Review of Systems  All other systems reviewed and are negative.  Blood pressure (!) 146/63, pulse (!) 16, temperature 97.6 F (36.4 C), temperature source Oral, resp. rate 18, SpO2 98 %. Physical Exam  Constitutional: She is oriented to person, place, and time. She appears well-developed and well-nourished.  HENT:  Head: Normocephalic and atraumatic.  Eyes: EOM are normal.  Cardiovascular: Intact distal pulses.  Respiratory: Effort normal.  Musculoskeletal:     Comments: L shoulder pain with limited ROM. NVID.  Neurological: She is alert and oriented to person, place, and time.  Skin: Skin is warm and dry.  Psychiatric: She has a normal mood and affect.     Assessment/Plan  Endstage L shoulder arthritis with significant pain and dysfunction with severe rotator cuff disease, failed conservative measures. Plan L reverse TSA likely with autograft glenoid augmentation Risks / benefits of surgery discussed Consent on chart  NPO for OR Preop antibiotics   Isabella Stalling, MD 10/27/2019, 9:02 AM

## 2019-10-27 NOTE — Discharge Instructions (Signed)
Discharge Instructions after Reverse Total Shoulder Arthroplasty   . A sling has been provided for you. You are to wear this at all times (except for bathing and dressing), until your first post operative visit with Dr. Tamera Punt. Please also wear while sleeping at night. While you bath and dress, let the arm/elbow extend straight down to stretch your elbow. Wiggle your fingers and pump your first while your in the sling to prevent hand swelling. . Use ice on the shoulder intermittently over the first 48 hours after surgery. Continue to use ice or and ice machine as needed after 48 hours for pain control/swelling.  . Pain medicine has been prescribed for you.  . Use your medicine liberally over the first 48 hours, and then you can begin to taper your use. You may take Extra Strength Tylenol or Tylenol only in place of the pain pills. DO NOT take ANY nonsteroidal anti-inflammatory pain medications: Advil, Motrin, Ibuprofen, Aleve, Naproxen or Naprosyn.  . Take one aspirin a day for 2 weeks after surgery, unless you have an aspirin sensitivity/allergy or asthma.  . Leave your dressing on until your first follow up visit.  You may shower with the dressing.  Hold your arm as if you still have your sling on while you shower. . Simply allow the water to wash over the site and then pat dry. Make sure your axilla (armpit) is completely dry after showering.    Please call [phone] during normal business hours or [phone] after hours for any problems. Including the following:  - excessive redness of the incisions - drainage for more than 4 days - fever of more than 101.5 F  *Please note that pain medications will not be refilled after hours or on weekends.

## 2019-10-27 NOTE — Anesthesia Postprocedure Evaluation (Signed)
Anesthesia Post Note  Patient: [name]  Procedure(s) Performed: REVERSE TOTAL SHOULDER ARTHROPLASTY (Left Shoulder)     Patient location during evaluation: PACU Anesthesia Type: Regional and General Level of consciousness: awake and alert, oriented and patient cooperative Pain management: pain level controlled Vital Signs Assessment: post-procedure vital signs reviewed and stable Respiratory status: spontaneous breathing, nonlabored ventilation and respiratory function stable Cardiovascular status: blood pressure returned to baseline and stable Postop Assessment: no apparent nausea or vomiting Anesthetic complications: no    Last Vitals:  Vitals:   10/27/19 1134 10/27/19 1345  BP: (!) 157/60 (!) 126/94  Pulse: 80 70  Resp: 16 18  Temp: (!) 36.3 C 36.7 C  SpO2: 100% 100%    Last Pain:  Vitals:   10/27/19 1345  TempSrc:   PainSc: 0-No pain                 Pervis Hocking

## 2019-10-27 NOTE — Anesthesia Procedure Notes (Signed)
Anesthesia Regional Block: Interscalene brachial plexus block   Pre-Anesthetic Checklist: ,, timeout performed, Correct Patient, Correct Site, Correct Laterality, Correct Procedure, Correct Position, site marked, Risks and benefits discussed,  Surgical consent,  Pre-op evaluation,  At surgeon's request and post-op pain management  Laterality: Left  Prep: Maximum Sterile Barrier Precautions used, chloraprep       Needles:  Injection technique: Single-shot  Needle Type: Echogenic Stimulator Needle     Needle Length: 9cm  Needle Gauge: 22     Additional Needles:   Procedures:,,,, ultrasound used (permanent image in chart),,,,  Narrative:  Start time: 10/27/2019 9:05 AM End time: 10/27/2019 9:15 AM Injection made incrementally with aspirations every 5 mL.  Performed by: Personally  Anesthesiologist: Pervis Hocking, DO  Additional Notes: Monitors applied. No increased pain on injection. No increased resistance to injection. Injection made in 5cc increments. Good needle visualization. Patient tolerated procedure well.

## 2019-10-27 NOTE — Transfer of Care (Signed)
Immediate Anesthesia Transfer of Care Note  Patient: [name]  Procedure(s) Performed: Procedure(s): REVERSE TOTAL SHOULDER ARTHROPLASTY (Left)  Patient Location: PACU  Anesthesia Type:General  Level of Consciousness:  sedated, patient cooperative and responds to stimulation  Airway & Oxygen Therapy:Patient Spontanous Breathing and Patient connected to face mask oxgen  Post-op Assessment:  Report given to PACU RN and Post -op Vital signs reviewed and stable  Post vital signs:  Reviewed and stable  Last Vitals:  Vitals:   10/27/19 0924 10/27/19 0925  BP:    Pulse: 73 71  Resp: 15 17  Temp:    SpO2: 507% 225%    Complications: No apparent anesthesia complications

## 2019-10-27 NOTE — Op Note (Signed)
Procedure(s): REVERSE TOTAL SHOULDER ARTHROPLASTY Procedure Note  [name] female 79 y.o. 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 female  With endstage left shoulder arthritis with severe glenoid bone loss. Pain and dysfunction interfered with quality of life and nonoperative treatment with activity modification, NSAIDS and injections failed.     Surgeon: Isabella Stalling   Assistants: Jeanmarie Hubert PA-C [facility] was present and scrubbed throughout the procedure and was essential in positioning, retraction, exposure, and closure)  Anesthesia: General endotracheal anesthesia with preoperative interscalene block given by the attending anesthesiologist    Procedure Detail  REVERSE TOTAL SHOULDER ARTHROPLASTY   Estimated Blood Loss:  200 mL         Drains: none  Blood Given: none          Specimens: none        Complications:  * No complications entered in OR log *         Disposition: PACU - hemodynamically stable.         Condition: stable      OPERATIVE FINDINGS:  A DJO Altivate standard stem cemented reverse total shoulder arthroplasty was placed with a  size 6 stem, a 32-4 glenosphere with humeral head autograft beneath the implant, and a +4-mm poly insert. The base plate  fixation was fair.  PROCEDURE: The patient was identified in the preoperative holding area  where I personally marked the operative site after verifying site, side,  and procedure with the patient. An interscalene block given by  the attending anesthesiologist in the holding area and the patient was taken back to the operating room where all extremities were  carefully padded in position after general anesthesia  was induced. She  was placed in a beach-chair position and the operative upper extremity was  prepped and draped in a standard sterile fashion. An approximately 10-  cm incision was made from the tip of the coracoid process to the center  point of the humerus at the level of the axilla. Dissection was carried  down through subcutaneous tissues to the level of the cephalic vein  which was taken laterally with the deltoid. The pectoralis major was  retracted medially. The subdeltoid space was developed and the lateral  edge of the conjoined tendon was identified. The undersurface of  conjoined tendon was palpated and the musculocutaneous nerve was not in  the field. Retractor was placed underneath the conjoined and second  retractor was placed lateral into the deltoid. The circumflex humeral  artery and vessels were identified and clamped and coagulated. The  biceps tendon was tenotomized.  The subscapularis was taken down as a peel with the underlying capsule.  The  joint was then gently externally rotated while the capsule was released  from the humeral neck around to just beyond the 6 o'clock position. At  this point, the joint was dislocated and the humeral head was presented  into the wound. The excessive osteophyte formation was removed with a  large rongeur.  The cutting guide was used to make the appropriate  head cut and the head was saved for bone grafting.  The head was severely degenerated and deformed.  The glenoid was exposed with the arm in an  abducted extended position. The anterior and posterior  labrum were  completely excised and the capsule was released circumferentially to  allow for exposure of the glenoid for preparation.  There was severe glenoid bone loss.  The humeral head cut was prepared on the back table.  It was placed on the glenoid building up the posterior superior bone loss.  It was trimmed to the appropriate size.  The articular surface was debrided down to a  bleeding surface to promote fusion.  The 2.5 mm drill was  placed using the guide and the tap was then advanced in the same hole.  No reaming was necessary.  The tap was then removed and the Metaglene was then screwed in with fairly good purchase and compression of the graft.  The peripheral guide was then used to drilled measured and filled peripheral locking screws. The size 32-4 glenosphere was then impacted on the [place] taper and the central screw was placed. The humerus was then again exposed and the diaphyseal reamers were used.  Bone quality was very poor and there was some anterior cortical loss. The final broach was left in place in the proximal trial was placed. The joint was reduced and with this implant it was felt that soft tissue tensioning was appropriate with excellent stability and excellent range of motion. Therefore, final humeral stem was placed cemented.  And then the trial polyethylene inserts were tested again and the above implant was felt to be the most appropriate for final insertion. The joint was reduced taken through full range of motion and felt to be stable. Soft tissue tension was appropriate.  The joint was then copiously irrigated with pulse  lavage and the wound was then closed. The subscapularis was not repaired.  Skin was closed with 2-0 Vicryl in a deep dermal layer and 4-0  Monocryl for skin closure. Steri-Strips were applied. Sterile  dressings were then applied as well as a sling. The patient was allowed  to awaken from general anesthesia, transferred to stretcher, and taken  to recovery room in stable condition.   POSTOPERATIVE PLAN: The patient will be kept in the hospital postoperatively  for pain control and therapy.

## 2019-10-27 NOTE — Anesthesia Procedure Notes (Signed)
Procedure Name: Intubation Date/Time: 10/27/2019 9:59 AM Performed by: Lavina Hamman, CRNA Pre-anesthesia Checklist: Patient identified, Emergency Drugs available, Suction available, Patient being monitored and Timeout performed Patient Re-evaluated:Patient Re-evaluated prior to induction Oxygen Delivery Method: Circle system utilized Preoxygenation: Pre-oxygenation with 100% oxygen Induction Type: IV induction Ventilation: Mask ventilation without difficulty Laryngoscope Size: Mac and 3 Grade View: Grade I Tube type: Oral Tube size: 7.0 mm Number of attempts: 1 Airway Equipment and Method: Stylet Placement Confirmation: ETT inserted through vocal cords under direct vision,  positive ETCO2,  CO2 detector and breath sounds checked- equal and bilateral Secured at: 21 cm Tube secured with: Tape Dental Injury: Teeth and Oropharynx as per pre-operative assessment  Comments: ATOI

## 2019-10-27 NOTE — Progress Notes (Signed)
Assisted Dr Criss Rosales with left, ultrasound guided, interscalene  block. Side rails up, monitors on throughout procedure. See vital signs in flow sheet. Tolerated Procedure well.

## 2019-10-28 ENCOUNTER — Encounter: Payer: Self-pay | Admitting: *Deleted

## 2019-10-28 DIAGNOSIS — M19012 Primary osteoarthritis, left shoulder: Secondary | ICD-10-CM | POA: Diagnosis not present

## 2019-10-28 LAB — CBC
HCT: 27.9 % — ABNORMAL LOW (ref 36.0–46.0)
Hemoglobin: 8.9 g/dL — ABNORMAL LOW (ref 12.0–15.0)
MCH: 31.3 pg (ref 26.0–34.0)
MCHC: 31.9 g/dL (ref 30.0–36.0)
MCV: 98.2 fL (ref 80.0–100.0)
Platelets: 172 10*3/uL (ref 150–400)
RBC: 2.84 MIL/uL — ABNORMAL LOW (ref 3.87–5.11)
RDW: 14.1 % (ref 11.5–15.5)
WBC: 10.3 10*3/uL (ref 4.0–10.5)
nRBC: 0 % (ref 0.0–0.2)

## 2019-10-28 LAB — PROTIME-INR
INR: 1 (ref 0.8–1.2)
Prothrombin Time: 13.1 seconds (ref 11.4–15.2)

## 2019-10-28 LAB — BASIC METABOLIC PANEL
Anion gap: 8 (ref 5–15)
BUN: 19 mg/dL (ref 8–23)
CO2: 25 mmol/L (ref 22–32)
Calcium: 8.3 mg/dL — ABNORMAL LOW (ref 8.9–10.3)
Chloride: 104 mmol/L (ref 98–111)
Creatinine, Ser: 1.52 mg/dL — ABNORMAL HIGH (ref 0.44–1.00)
GFR calc Af Amer: 38 mL/min — ABNORMAL LOW (ref >60)
GFR calc non Af Amer: 33 mL/min — ABNORMAL LOW (ref >60)
Glucose, Bld: 121 mg/dL — ABNORMAL HIGH (ref 70–99)
Potassium: 4.4 mmol/L (ref 3.5–5.1)
Sodium: 137 mmol/L (ref 135–145)

## 2019-10-28 MED ORDER — OXYCODONE-ACETAMINOPHEN 5-325 MG PO TABS: ORAL_TABLET | ORAL | 0 refills | Status: DC

## 2019-10-28 MED ORDER — TIZANIDINE HCL 4 MG PO TABS
4.0000 mg | ORAL_TABLET | Freq: Three times a day (TID) | ORAL | 1 refills | Status: DC | PRN
Start: 2019-10-28 — End: 2019-11-09

## 2019-10-28 NOTE — Plan of Care (Signed)
Pt ready for DC home

## 2019-10-28 NOTE — Evaluation (Signed)
Occupational Therapy Evaluation Patient Details Name: [name] MRN: [id number] DOB: [birth year] Today's Date: 10/28/2019    History of Present Illness Patient is a 79 year old female s/p left reverse total shoulder replacement   Clinical Impression   Patient instructed in shoulder protocol, prescribed exercises listed below and compensatory strategies for self care. Patient verbalize/demo understanding, no further questions/concerns at this time.     Follow Up Recommendations  Follow surgeon's recommendation for DC plan and follow-up therapies    Equipment Recommendations  None recommended by OT       Precautions / Restrictions Precautions Precautions: Shoulder Type of Shoulder Precautions: sling on except for ADL/exercises, distal AROM ok, no P/AROM at shoulder Shoulder Interventions: Shoulder sling/immobilizer;Off for dressing/bathing/exercises Precaution Booklet Issued: Yes (comment) Required Braces or Orthoses: Sling Restrictions Weight Bearing Restrictions: Yes LUE Weight Bearing: Non weight bearing      Mobility Bed Mobility Overal bed mobility: Modified Independent                Transfers Overall transfer level: Needs assistance Equipment used: None Transfers: Sit to/from Stand Sit to Stand: Min guard         General transfer comment: for safety due to mild unsteadiness    Balance Overall balance assessment: Mild deficits observed, not formally tested                                         ADL either performed or assessed with clinical judgement   ADL Overall ADL's : Needs assistance/impaired Eating/Feeding: Set up;Sitting   Grooming: Set up;Sitting   Upper Body Bathing: Minimal assistance;Sitting   Lower Body Bathing: Min guard;Sit to/from stand   Upper Body Dressing : Minimal assistance;Sitting;Cueing for compensatory techniques;Cueing for UE precautions Upper Body Dressing Details (indicate cue type and  reason): provide visual demo, pt verbalize understanding Lower Body Dressing: Min guard;Sit to/from stand Lower Body Dressing Details (indicate cue type and reason): to don underwear and pants, min cues to maintain shoulder precautions Toilet Transfer: Min guard;Ambulation Toilet Transfer Details (indicate cue type and reason): simulated to chair, min guard for safety due to mild unsteadiness Toileting- Clothing Manipulation and Hygiene: Min guard;Sitting/lateral lean;Sit to/from stand       Functional mobility during ADLs: Min guard       Vision Baseline Vision/History: Wears glasses Wears Glasses: Reading only              Pertinent Vitals/Pain Pain Assessment: No/denies pain     Hand Dominance Right   Extremity/Trunk Assessment Upper Extremity Assessment Upper Extremity Assessment: LUE deficits/detail LUE Deficits / Details: AROM of hand, wrist, elbow intact LUE Sensation: decreased light touch(residual nerve block)   Lower Extremity Assessment Lower Extremity Assessment: Overall WFL for tasks assessed   Cervical / Trunk Assessment Cervical / Trunk Assessment: Normal   Communication Communication Communication: No difficulties   Cognition Arousal/Alertness: Awake/alert Behavior During Therapy: WFL for tasks assessed/performed Overall Cognitive Status: Within Functional Limits for tasks assessed                                        Exercises Exercises: Shoulder Shoulder Exercises Elbow Flexion: AROM;Left;5 reps;Seated Elbow Extension: AROM;Left;5 reps;Seated Wrist Flexion: AROM;Left;5 reps;Seated Wrist Extension: AROM;Left;5 reps;Seated Digit Composite Flexion: AROM;Left;5 reps;Seated Composite Extension: AROM;Left;5 reps;Seated Neck Flexion: AROM;5  reps;Seated Neck Extension: AROM;5 reps;Seated Neck Lateral Flexion - Right: AROM;5 reps;Seated Neck Lateral Flexion - Left: AROM;5 reps;Seated   Shoulder Instructions Shoulder  Instructions Donning/doffing shirt without moving shoulder: Patient able to independently direct caregiver Method for sponge bathing under operated UE: Patient able to independently direct caregiver Donning/doffing sling/immobilizer: Patient able to independently direct caregiver Correct positioning of sling/immobilizer: Independent ROM for elbow, wrist and digits of operated UE: Independent;Patient able to independently direct caregiver Sling wearing schedule (on at all times/off for ADL's): Independent;Patient able to independently direct caregiver Proper positioning of operated UE when showering: Independent;Patient able to independently direct caregiver Positioning of UE while sleeping: Independent;Patient able to independently direct caregiver    Home Living Family/patient expects to be discharged to:: Private residence Living Arrangements: Spouse/significant other Available Help at Discharge: Family;Available 24 hours/day Type of Home: House Home Access: Stairs to enter [employer] of Steps: 3 Entrance Stairs-Rails: Can reach both;Left;Right Home Layout: One level     Bathroom Shower/Tub: Occupational psychologist: Standard Bathroom Accessibility: Yes How Accessible: Accessible via walker Home Equipment: Cane - single point;Walker - 2 wheels;Shower seat - built in;Grab bars - tub/shower          Prior Functioning/Environment Level of Independence: Needs assistance  Gait / Transfers Assistance Needed: ambulates with cane ocassionally, leaves on in the car ADL's / Homemaking Assistance Needed: assist with donning bra from spouse             OT Problem List: Pain;Impaired UE functional use         OT Goals(Current goals can be found in the care plan section) Acute Rehab OT Goals Patient Stated Goal: regain use of arm OT Goal Formulation: With patient Time For Goal Achievement: 11/11/19 Potential to Achieve Goals: Good   AM-PAC OT "6 Clicks"  Daily Activity     Outcome Measure Help from another person eating meals?: A Little Help from another person taking care of personal grooming?: A Little Help from another person toileting, which includes using toliet, bedpan, or urinal?: A Little Help from another person bathing (including washing, rinsing, drying)?: A Little Help from another person to put on and taking off regular upper body clothing?: A Little Help from another person to put on and taking off regular lower body clothing?: A Little 6 Click Score: 18   End of Session Equipment Utilized During Treatment: Other (comment)(sling)  Activity Tolerance: Patient tolerated treatment well Patient left: in chair;with call bell/phone within reach;with chair alarm set  OT Visit Diagnosis: Pain Pain - Right/Left: Left Pain - part of body: Shoulder                Time: 0730-0800 OT Time Calculation (min): 30 min Charges:  OT General Charges $OT Visit: 1 Visit OT Evaluation $OT Eval Moderate Complexity: 1 Mod  Delbert Phenix OT Pager: [place] 10/28/2019, 8:45 AM

## 2019-10-28 NOTE — Discharge Summary (Signed)
Patient ID: [name] MRN: [id number] DOB/AGE: [birth year] 79 y.o.  Admit date: 10/27/2019 Discharge date: 10/28/2019  Admission Diagnoses:  Active Problems:   Status post reverse total shoulder replacement, left   Discharge Diagnoses:  Same  Past Medical History:  Diagnosis Date  . Allergy    seasonal  . Anemia   . Anxiety   . Arthritis   . Asthma   . 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 Protecta  . Biventricular ICD (implantable cardioverter-defibrillator) in place    with CRT  . Blood transfusion without reported diagnosis   . Bursitis   . Cataract    RIGHT EYE  . CHF (congestive heart failure) ([place])   . Chronic kidney disease   . Colon polyp    adenomatous  . Complication of anesthesia    patient stated that had difficulty getting the breathing tube removed, patient said that she stopped breathing and HR dropped to 10  patient then woke up and started breathing pateint stated no longer than one minute; re-intubated in PACU following cholecystectomy 10/28/13  . COPD (chronic obstructive pulmonary disease) ([place])   . Depression   . Diverticulosis   . Dysrhythmia   . Fibromyalgia   . GERD (gastroesophageal reflux disease)   . Gout   . H/O mitral valve replacement 2002, 2007  . Heart murmur   . Hemorrhoids   . Hyperlipidemia   . Hypertension   . Hypothyroidism   . IBS (irritable bowel syndrome)   . MVP (mitral valve prolapse)   . Neuromuscular disorder (H[facility])    fibromyalgia  . Pacemaker   . Peptic ulcer disease     Surgeries: Procedure(s): REVERSE TOTAL SHOULDER ARTHROPLASTY on 10/27/2019   Consultants:   Discharged Condition: Improved  Hospital Course: [name] is an 79 y.o. female who was admitted 10/27/2019 for operative treatment of left shoulder rotator cuff tear arthropathy. Patient has severe unremitting pain that affects sleep, daily activities, and work/hobbies. After pre-op clearance the patient  was taken to the operating room on 10/27/2019 and underwent  Procedure(s): REVERSE TOTAL SHOULDER ARTHROPLASTY.    Patient was given perioperative antibiotics:  Anti-infectives (From admission, onward)   Start     Dose/Rate Route Frequency Ordered Stop   10/27/19 2200  hydroxychloroquine (PLAQUENIL) tablet 100 mg     100 mg Oral Daily at bedtime 10/27/19 1443     10/27/19 1700  ceFAZolin (ANCEF) IVPB 1 g/50 mL premix     1 g 100 mL/hr over 30 Minutes Intravenous Every 6 hours 10/27/19 1443 10/28/19 0559   10/27/19 0815  ceFAZolin (ANCEF) IVPB 2g/100 mL premix     2 g 200 mL/hr over 30 Minutes Intravenous On call to O.R. 10/27/19 0809 10/27/19 0955       Patient was given sequential compression devices, early ambulation, and chemoprophylaxis to prevent DVT.  Patient benefited maximally from hospital stay and there were no complications.    Recent vital signs:  Patient Vitals for the past 24 hrs:  BP Temp Temp src Pulse Resp SpO2 Height Weight  10/28/19 0532 (!) 115/57 98.2 F (36.8 C) Oral 70 18 98 % -- --  10/28/19 0215 (!) 126/58 98.3 F (36.8 C) Oral 73 16 98 % -- --  10/27/19 2324 -- -- -- -- -- -- 5' 2"  (1.575 m) 48.5 kg  10/27/19 2203 (!) 134/56 97.8 F (36.6 C) Oral 72 17 97 % -- --  10/27/19 1854 129/68 98.4 F (36.9 C) Oral  77 18 100 % -- --  10/27/19 1759 134/65 98 F (36.7 C) Oral 72 17 98 % -- --  10/27/19 1655 (!) 139/59 98.3 F (36.8 C) Oral 71 17 97 % -- --  10/27/19 1551 (!) 150/59 97.7 F (36.5 C) Oral 74 20 99 % -- --  10/27/19 1530 (!) 138/58 -- -- 76 20 100 % -- --  10/27/19 1515 138/74 98.2 F (36.8 C) -- 73 (!) 25 100 % -- --  10/27/19 1500 122/70 -- -- 72 16 100 % -- --  10/27/19 1445 (!) 129/59 -- -- 71 (!) 29 100 % -- --  10/27/19 1430 107/75 -- -- 72 20 100 % -- --  10/27/19 1415 125/82 -- -- 70 18 99 % -- --  10/27/19 1400 124/61 -- -- 70 19 100 % -- --  10/27/19 1345 (!) 126/94 98 F (36.7 C) -- 70 18 100 % -- --  10/27/19 1134 (!) 157/60 (!)  97.3 F (36.3 C) -- 80 16 100 % -- --  10/27/19 1031 -- -- -- -- -- -- -- 48.5 kg  10/27/19 0925 -- -- -- 71 17 100 % -- --  10/27/19 0924 -- -- -- 73 15 100 % -- --  10/27/19 0923 -- -- -- 70 13 100 % -- --  10/27/19 0922 -- -- -- 70 (!) 8 100 % -- --  10/27/19 0921 -- -- -- 69 10 100 % -- --  10/27/19 0920 (!) 164/67 -- -- 69 16 100 % -- --  10/27/19 0919 -- -- -- 70 (!) 6 100 % -- --  10/27/19 0918 -- -- -- 71 11 100 % -- --  10/27/19 0917 (!) 164/62 -- -- 71 18 100 % -- --  10/27/19 0916 -- -- -- 72 18 100 % -- --     Recent laboratory studies:  Recent Labs    10/28/19 0330  WBC 10.3  HGB 8.9*  HCT 27.9*  PLT 172  NA 137  K 4.4  CL 104  CO2 25  BUN 19  CREATININE 1.52*  GLUCOSE 121*  INR 1.0  CALCIUM 8.3*     Discharge Medications:   Allergies as of 10/28/2019      Reactions   Ace Inhibitors Cough   Lipitor [atorvastatin Calcium] Other (See Comments)   Knee pain.   Codeine Nausea And Vomiting      Medication List    STOP taking these medications   acetaminophen 500 MG tablet Commonly known as: TYLENOL     TAKE these medications   allopurinol 300 MG tablet Commonly known as: ZYLOPRIM Take 300 mg by mouth every evening.   ALPRAZolam 0.25 MG tablet Commonly known as: XANAX Take 0.25 mg by mouth at bedtime as needed for anxiety or sleep.   buPROPion 100 MG tablet Commonly known as: WELLBUTRIN Take 100 mg by mouth daily.   enoxaparin 40 MG/0.4ML injection Commonly known as: LOVENOX Inject 0.4 mLs (40 mg total) into the skin daily.   esomeprazole 20 MG capsule Commonly known as: NEXIUM Take 20 mg by mouth daily at 12 noon.   furosemide 40 MG tablet Commonly known as: LASIX TAKE 1 TABLET BY MOUTH DAILY. **NEEDS OV FOR FUTURE REFILLS** What changed: See the new instructions.   hydroxychloroquine 200 MG tablet Commonly known as: PLAQUENIL Take 100 mg by mouth at bedtime.   metoprolol succinate 25 MG 24 hr tablet Commonly known as:  TOPROL-XL Take 1.5 tablets (37.5 mg total) by  mouth daily.   oxyCODONE-acetaminophen 5-325 MG tablet Commonly known as: Percocet Take 1-2 tablets every 4 hours as needed for post operative pain. MAX 6/day   pravastatin 40 MG tablet Commonly known as: PRAVACHOL Take 40 mg at bedtime by mouth.   ProAir HFA 108 (90 Base) MCG/ACT inhaler Generic drug: albuterol Inhale 1-2 puffs into the lungs every 6 (six) hours as needed for wheezing.   Senna 15 MG Tabs Take 15 mg by mouth at bedtime.   spironolactone 25 MG tablet Commonly known as: ALDACTONE Take 1 tablet (25 mg total) by mouth daily.   Synthroid 50 MCG tablet Generic drug: levothyroxine Take 50 mcg by mouth daily.   tiZANidine 4 MG tablet Commonly known as: Zanaflex Take 1 tablet (4 mg total) by mouth every 8 (eight) hours as needed for muscle spasms.   warfarin 2.5 MG tablet Commonly known as: COUMADIN Take as directed. If you are unsure how to take this medication, talk to your nurse or doctor. Original instructions: TAKE 1 TO 1 & 1/2 TABLETS BY MOUTH DAILY AS DIRECTED BY COUMADIN CLINIC What changed:   how much to take  how to take this  when to take this  additional instructions       Diagnostic Studies: DG Chest 2 View  Result Date: 10/20/2019 EXAM: CHEST - 2 VIEW COMPARISON:  12/24/2015. FINDINGS: Cardiac pacer noted with lead tip over the right atrium right ventricle. Epicardial pacing wires noted. Prior median sternotomy and cardiac valve replacement. Heart size normal. No focal infiltrate. No pleural effusion or pneumothorax. IMPRESSION: Cardiac pacer noted. Prior cardiac valve replacement. Heart size normal. No pulmonary venous congestion. No acute pulmonary disease. Electronically Signed   By: Marcello Moores  Register   On: 10/20/2019 14:17   CT SHOULDER LEFT WO CONTRAST  Result Date: 10/11/2019 CLINICAL DATA:  Chronic left shoulder pain. EXAM: CT OF THE UPPER LEFT EXTREMITY WITHOUT CONTRAST TECHNIQUE:  Multidetector CT imaging of the left shoulder was performed according to the standard protocol. COMPARISON:  Left shoulder x-rays dated December 27, 2017. CT left shoulder dated April 09, 2017. FINDINGS: Bones/Joint/Cartilage No fracture or dislocation. Small glenohumeral joint effusion. Severe osteoarthritis of the glenohumeral joint with severe joint space narrowing, subchondral sclerosis and cystic changes and small marginal osteophytosis. Progressive glenoid erosion and bone stock loss since the prior study. Progressive flattening of the humeral head. Normal acromioclavicular joint.  Type II acromion. Ligaments Ligaments are suboptimally evaluated by CT. Muscles and Tendons Grossly intact. New mild supraspinatus muscle atrophy. Unchanged small lipoma in the subscapularis muscle. Soft tissue No fluid collection or hematoma. No soft tissue mass. Unchanged left chest wall AICD. Prior CABG and MVR. Centrilobular and paraseptal emphysema in the left lung. IMPRESSION: 1. Severe glenohumeral osteoarthritis with progressive glenoid erosion and bone stock loss since the prior study. 2. New mild supraspinatus muscle atrophy. Electronically Signed   By: Titus Dubin M.D.   On: 10/11/2019 05:23   DG Shoulder Left Port  Result Date: 10/27/2019 CLINICAL DATA:  Status post total shoulder arthroplasty EXAM: LEFT SHOULDER COMPARISON:  December 27, 2017 FINDINGS: Patient has undergone total shoulder arthroplasty on the left. The alignment is near anatomic. There are expected postsurgical changes. There is a multi lead left-sided pacemaker/ICD in place. IMPRESSION: Expected postsurgical changes related to total shoulder arthroplasty. Electronically Signed   By: Constance Holster M.D.   On: 10/27/2019 15:52    Disposition: Discharge disposition: 01-Home or Self Care       Discharge Instructions  Call MD / Call 911   Complete by: As directed    If you experience chest pain or shortness of breath, CALL 911 and be  transported to the hospital emergency room.  If you develope a fever above 101 F, pus (white drainage) or increased drainage or redness at the wound, or calf pain, call your surgeon's office.   Constipation Prevention   Complete by: As directed    Drink plenty of fluids.  Prune juice may be helpful.  You may use a stool softener, such as Colace (over the counter) 100 mg twice a day.  Use MiraLax (over the counter) for constipation as needed.   Diet - low sodium heart healthy   Complete by: As directed    Increase activity slowly as tolerated   Complete by: As directed       Follow-up Information    Tania Ade, MD. Schedule an appointment as soon as possible for a visit in 2 weeks.   Specialty: Orthopedic Surgery Contact information: [place] [address] [phone]            Signed: Grier Mitts 10/28/2019, 9:15 AM

## 2019-10-28 NOTE — Progress Notes (Signed)
   PATIENT ID: [name]   1 Day Post-Op Procedure(s) (LRB): REVERSE TOTAL SHOULDER ARTHROPLASTY (Left)  Subjective: Doing very well, sitting up eating breakfast. Denies lightheadedness/dizziness. Feels ready to go home today. No pain.   Objective:  Vitals:   10/28/19 0215 10/28/19 0532  BP: (!) 126/58 (!) 115/57  Pulse: 73 70  Resp: 16 18  Temp: 98.3 F (36.8 C) 98.2 F (36.8 C)  SpO2: 98% 98%     L shoulder dressing c/d/i Wiggles fingers, distally NVI  Labs:  Recent Labs    10/28/19 0330  HGB 8.9*   Recent Labs    10/28/19 0330  WBC 10.3  RBC 2.84*  HCT 27.9*  PLT 172   Recent Labs    10/28/19 0330  NA 137  K 4.4  CL 104  CO2 25  BUN 19  CREATININE 1.52*  GLUCOSE 121*  CALCIUM 8.3*    Assessment and Plan: 1 day s/p L reverse total shoulder arthroplasty OT- conservative protocol ABLA- expected po, asymptomatic and will cont to observe Fu with coumadin clinic Tuesday, antigcoag per their reccommendations Dc home when cleared by OT Fu with Dr. Tamera Punt in 2 weeks  VTE proph: lovenox, warfarin, scds

## 2019-11-01 ENCOUNTER — Ambulatory Visit (INDEPENDENT_AMBULATORY_CARE_PROVIDER_SITE_OTHER): Payer: Medicare Other | Admitting: Pharmacist Clinician (PhC)/ Clinical Pharmacy Specialist

## 2019-11-01 ENCOUNTER — Other Ambulatory Visit: Payer: Self-pay

## 2019-11-01 DIAGNOSIS — Z7901 Long term (current) use of anticoagulants: Secondary | ICD-10-CM

## 2019-11-01 DIAGNOSIS — Z952 Presence of prosthetic heart valve: Secondary | ICD-10-CM

## 2019-11-01 LAB — POCT INR: INR: 2.7 (ref 2.0–3.0)

## 2019-11-09 ENCOUNTER — Ambulatory Visit: Payer: Medicare Other | Admitting: Pharmacist Clinician (PhC)/ Clinical Pharmacy Specialist

## 2019-11-09 ENCOUNTER — Other Ambulatory Visit: Payer: Self-pay

## 2019-11-09 ENCOUNTER — Encounter: Payer: Self-pay | Admitting: Cardiovascular Disease

## 2019-11-09 ENCOUNTER — Ambulatory Visit (INDEPENDENT_AMBULATORY_CARE_PROVIDER_SITE_OTHER): Payer: Medicare Other | Admitting: Cardiovascular Disease

## 2019-11-09 VITALS — BP 152/50 | HR 70 | Ht 62.0 in | Wt 103.6 lb

## 2019-11-09 DIAGNOSIS — Z952 Presence of prosthetic heart valve: Secondary | ICD-10-CM

## 2019-11-09 DIAGNOSIS — I4719 Other supraventricular tachycardia: Secondary | ICD-10-CM

## 2019-11-09 DIAGNOSIS — N1832 Chronic kidney disease, stage 3b: Secondary | ICD-10-CM

## 2019-11-09 DIAGNOSIS — I5032 Chronic diastolic (congestive) heart failure: Secondary | ICD-10-CM

## 2019-11-09 DIAGNOSIS — Z7901 Long term (current) use of anticoagulants: Secondary | ICD-10-CM

## 2019-11-09 DIAGNOSIS — I480 Paroxysmal atrial fibrillation: Secondary | ICD-10-CM

## 2019-11-09 DIAGNOSIS — Z95810 Presence of automatic (implantable) cardiac defibrillator: Secondary | ICD-10-CM | POA: Diagnosis not present

## 2019-11-09 DIAGNOSIS — E782 Mixed hyperlipidemia: Secondary | ICD-10-CM

## 2019-11-09 DIAGNOSIS — I471 Supraventricular tachycardia: Secondary | ICD-10-CM

## 2019-11-09 LAB — POCT INR: INR: 3.1 — AB (ref 2.0–3.0)

## 2019-11-09 NOTE — Patient Instructions (Signed)
Medication Instructions:  Continue current medications  *If you need a refill on your cardiac medications before your next appointment, please call your pharmacy*   Lab Work: None ordered  Testing/Procedures: None Ordered   Follow-Up: At [facility], you and your health needs are our priority.  As part of our continuing mission to provide you with exceptional heart care, we have created designated Provider Care Teams.  These Care Teams include your primary Cardiologist (physician) and Advanced Practice Providers (APPs -  Physician Assistants and Nurse Practitioners) who all work together to provide you with the care you need, when you need it.  We recommend signing up for the patient portal called "MyChart".  Sign up information is provided on this After Visit Summary.  MyChart is used to connect with patients for Virtual Visits (Telemedicine).  Patients are able to view lab/test results, encounter notes, upcoming appointments, etc.  Non-urgent messages can be sent to your provider as well.   To learn more about what you can do with MyChart, go to [email or web address].    Your next appointment:   1 Year  The format for your next appointment:   In Person  Provider:   You may see Alyson Locket, MD or one of the following Advanced Practice Providers on your designated Care Team:    Almyra Deforest, PA-C  Fabian Sharp, PA-C or   Roby Lofts, [place]

## 2019-11-09 NOTE — Progress Notes (Signed)
Cardiology office note    Date:  11/13/2019   ID:  [name], DOB 1940/09/07, MRN [id number]  PCP:  Leanna Battles, MD  Cardiologist: Quay Burow, MD  Electrophysiologist:  Sanda Klein, MD   Evaluation Performed:  Follow-Up Visit  Chief Complaint:  ICD follow up  History of Present Illness:    [name] is a 79 y.o. female with  mechanical mitral valve replacement (repair 2002, replacement 2007,  biventricular pacemaker/defibrillator  (Medtronic Emerson, generator changeout in November 2018) and chronic heart failure.    On May 27 she underwent left shoulder surgery (reverse replacement), which was a very difficult procedure due to deteriorating bones.  The site has healed nicely, but she is struggling to get normal function.  Her defibrillator detected a very rapid decrease in impedance immediately following surgery (our ICD is located very close to the left shoulder surgical site), but this is quickly rebounding back to normal range.  She did have generalized edema following surgery and her weight increased to 112 pounds.  After diuretic dose adjustment she is back down to 103.6 pounds  Device interrogated in the office today.  Her Medtronic Claria device was implanted in 2018 as a generator change and still has about 6 years of estimated battery longevity.  She has 97% biventricular pacing and also has 24% atrial pacing.  Her heart rate histograms are little blunted but she is also quite sedentary.  The device has detected 8 episodes of paroxysmal atrial tachycardia all of them occurring on June 7.  Their cumulative duration was only 26 minutes.  In early 2020 she began developing" noise" on the right ventricular pace-sense channel.  She has recovered left ventricular systolic function to an ejection fraction of 50-55% and she has never received tachycardia therapies.  Ventricular arrhythmia detection and therapies are turned off.  On her follow-up echo from  August 15, 2019 her left ventricular ejection fraction has decreased slightly to 45-50% (the statement that she has "moderate to severely decreased function" is likely a typographical error).  Diastolic function shows pseudonormalization (but diastolic function cannot be adequately assessed with a mitral prosthesis.).  The mechanical mitral valve prosthesis function was normal with a mean gradient of 2.5 mmHg and trivial MR.  She denies orthopnea, PND or exertional dyspnea with usual activity.  She does not have chest pain, dizziness or syncope.  Her edema has resolved.  She does not have intermittent claudication and has not had any focal neurological event or bleeding.  Nausea and vomiting has not been a problem recently.  For years she has been plagued by recurrent episodes of intractable vomiting, which she has discovered are related to unpleasant phone interactions with her son.  Her son has problems with addiction and has been in prison and is often verbally abusive.  She is raising her grandson, since his father cannot care for him and his mother overdosed.  Unfortunately, [name] lost another son to addiction as well.  [name] has a long-standing history of valvular heart disease, undergoing mitral valve repair in 2002, followed by a mitral valve replacement with a mechanical prosthesis in 2007. Left ventricular ejection fraction which has been as low as 10% improved substantially after implantation of the CRT-D device and now her LVEF is near normal. She received her initial CRT-D in 2008 and underwent a generator change out in 2012 and 2019. She has not received VT therapy. She has a Riata 7 [place] ICD lead under advisory, which  appeared fluoroscopically normal at the time of last generator change.  Did not have any angiographic CAD prior to her surgery.    The atrial lead is a Medtronic [id number] serial number [id number] implanted in July 23, 2006 [name]) The right ventricular lead is a  Chemical engineer 7002 serial number [id number] implanted same date The left ventricular lead is a Guidant BS CI 4543 LV lead, serial [id number] implanted same date She had redundant Medtronic left ventricular epicardial pacing leads implanted at the time of surgery in 2007.   The patient does not have symptoms concerning for COVID-19 infection (fever, chills, cough, or new shortness of breath).    Past Medical History:  Diagnosis Date  . Allergy    seasonal  . Anemia   . Anxiety   . Arthritis   . Asthma   . 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 Protecta  . Biventricular ICD (implantable cardioverter-defibrillator) in place    with CRT  . Blood transfusion without reported diagnosis   . Bursitis   . Cataract    RIGHT EYE  . CHF (congestive heart failure) ([place])   . Chronic kidney disease   . Colon polyp    adenomatous  . Complication of anesthesia    patient stated that had difficulty getting the breathing tube removed, patient said that she stopped breathing and HR dropped to 10  patient then woke up and started breathing pateint stated no longer than one minute; re-intubated in PACU following cholecystectomy 10/28/13  . COPD (chronic obstructive pulmonary disease) ([place])   . Depression   . Diverticulosis   . Dysrhythmia   . Fibromyalgia   . GERD (gastroesophageal reflux disease)   . Gout   . H/O mitral valve replacement 2002, 2007  . Heart murmur   . Hemorrhoids   . Hyperlipidemia   . Hypertension   . Hypothyroidism   . IBS (irritable bowel syndrome)   . MVP (mitral valve prolapse)   . Neuromuscular disorder (H[facility])    fibromyalgia  . Pacemaker   . Peptic ulcer disease    Past Surgical History:  Procedure Laterality Date  . ABDOMINAL HYSTERECTOMY    . BIV ICD GENERATOR CHANGEOUT N/A 04/29/2017   Procedure: BIV ICD GENERATOR CHANGEOUT;  Surgeon: Sanda Klein, MD;  Location: [facility];  Service: Cardiovascular;  Laterality:  N/A;  . CARDIAC CATHETERIZATION  02/25/2006   normal left main, normal LAD, normal L Cfx, normal/dominant RCA (Dr. [name])  . CARDIAC DEFIBRILLATOR PLACEMENT  2007, 11/202012   x2 (pacemaker) (Dr. Jerilynn Mages. Persephanie Laatsch)  . CARDIAC VALVE REPLACEMENT  2002   MV repair - Dr. Berle Mull  . CHOLECYSTECTOMY N/A 10/28/2013   Procedure: LAPAROSCOPIC CHOLECYSTECTOMY WITH INTRAOPERATIVE CHOLANGIOGRAM;  Surgeon: Edward Jolly, MD;  Location: [facility];  Service: General;  Laterality: N/A;  . COLONOSCOPY    . EYE SURGERY    . IMPLANTABLE CARDIOVERTER DEFIBRILLATOR (ICD) GENERATOR CHANGE N/A 04/10/2011   Procedure: ICD GENERATOR CHANGE;  Surgeon: Sanda Klein, MD;  Location: [facility];  Service: Cardiovascular;  Laterality: N/A;  . INSERT / REPLACE / REMOVE PACEMAKER    . KNEE ARTHROSCOPY    . MITRAL VALVE REPLACEMENT  02/26/2006   re-do MVR w/54m St. [name] (Dr. BEllison Hughs  . NM MYOCAR PERF WALL MOTION  2005   persantine myoview - low ris, EF 63%  . RIGHT HEART CATH  04/03/2006   pulm cap wedge pressure 24/24, PA pressure 43/22 (mean  82mHg), CO 4.8, CI 4.1 (Dr. JJackie Plum  . TOTAL SHOULDER ARTHROPLASTY Right 12/27/2015   Procedure: RIGHT TOTAL SHOULDER ARTHROPLASTY;  Surgeon: JTania Ade MD;  Location[place] [facility]  Service: Orthopedics;  Laterality: Right;  Right total shoulder arthroplasty  . TOTAL SHOULDER ARTHROPLASTY Left 10/27/2019   Procedure: REVERSE TOTAL SHOULDER ARTHROPLASTY;  Surgeon: CTania Ade MD;  Location: [facility];  Service: Orthopedics;  Laterality: Left;  . TRANSTHORACIC ECHOCARDIOGRAM  12/2011   EF 50-55%, mild global hypokinesis; LA severely dilated; calcification of anterior/posterior MV leaflets, bi-leaflets St. [name] mechanical MV; mild TR; trace AV regurg/pulm valve regurg     Current Meds  Medication Sig  . allopurinol (ZYLOPRIM) 300 MG tablet Take 300 mg by mouth every evening.   .Marland KitchenALPRAZolam (XANAX) 0.25 MG tablet Take 0.25 mg by mouth at bedtime as needed for anxiety or  sleep.   .Marland KitchenbuPROPion (WELLBUTRIN) 100 MG tablet Take 100 mg by mouth daily.  .Marland Kitchenenoxaparin (LOVENOX) 40 MG/0.4ML injection Inject 0.4 mLs (40 mg total) into the skin daily.  .Marland Kitchenesomeprazole (NEXIUM) 20 MG capsule Take 20 mg by mouth daily at 12 noon.  . furosemide (LASIX) 40 MG tablet TAKE 1 TABLET BY MOUTH DAILY. **NEEDS OV FOR FUTURE REFILLS** (Patient taking differently: Take 40 mg by mouth daily. )  . HYDROcodone-acetaminophen (NORCO/VICODIN) 5-325 MG tablet Take 1-2 tablets by mouth every 6 (six) hours as needed for moderate pain.  . metoprolol succinate (TOPROL-XL) 25 MG 24 hr tablet Take 1.5 tablets (37.5 mg total) by mouth daily.  . pravastatin (PRAVACHOL) 40 MG tablet Take 40 mg at bedtime by mouth.   .Marland KitchenPROAIR HFA 108 (90 BASE) MCG/ACT inhaler Inhale 1-2 puffs into the lungs every 6 (six) hours as needed for wheezing.   . Sennosides (SENNA) 15 MG TABS Take 15 mg by mouth at bedtime.   .Marland Kitchenspironolactone (ALDACTONE) 25 MG tablet Take 1 tablet (25 mg total) by mouth daily.  .Marland KitchenSYNTHROID 50 MCG tablet Take 50 mcg by mouth daily.  .Marland Kitchenwarfarin (COUMADIN) 2.5 MG tablet TAKE 1 TO 1 & 1/2 TABLETS BY MOUTH DAILY AS DIRECTED BY COUMADIN CLINIC (Patient taking differently: Take 2.5-5 mg by mouth See admin instructions. Take 5 mg in the evening on Wed and Fri, Take 2.5 mg in the evening on Sun, Mon, Tue, Thurs, and Sat)   Current Facility-Administered Medications for the 11/09/19 encounter (Office Visit) with CSanda Klein MD  Medication  . 0.9 %  sodium chloride infusion     Allergies:   Ace inhibitors, Lipitor [atorvastatin calcium], and Codeine   Social History   Tobacco Use  . Smoking status: Former Smoker    Types: Cigarettes    Quit date: 06/02/2001    Years since quitting: 18.4  . Smokeless tobacco: Never Used  Vaping Use  . Vaping Use: Never used  Substance Use Topics  . Alcohol use: Yes    Alcohol/week: 0.0 standard drinks    Comment: social-1 every 6 months  . Drug use: No      Family Hx: The patient's family history includes Breast cancer in her sister; Crohn's disease in her sister; Heart attack in her father and mother; Heart disease in her father, mother, sister, and son; Kidney disease in her father; Uterine cancer in her sister. There is no history of Colon cancer or Stomach cancer.  ROS:   Please see the history of present illness.    All other systems are reviewed and are negative.  Prior CV  studies:   The following studies were reviewed today:  08/10/2018 ECHO  1. The left ventricle has low normal systolic function, with an ejection fraction of 50-55%. The cavity size was normal. There is mildly increased left ventricular wall thickness. Left ventricular diastolic function could not be evaluated due to mitral  valve replacement/repair.  2. The right ventricle has normal systolic function. The cavity was normal. There is no increase in right ventricular wall thickness.  3. Left atrial size was mildly dilated.  4. St. [name] Mechanical mitral valve prothesis present and functioning properly.  5. The aortic valve is tricuspid Mild thickening of the aortic valve Mild calcification of the aortic valve. Aortic valve regurgitation is mild by color flow Doppler.  6. Pulmonic valve regurgitation is mild by color flow Doppler.  MV Peak grad:  7.4 mmHg     MV Mean grad:  4.0 mmHg     MV PHT:        85.84 msec    08/15/2019 echo  1. Difficult acoustic windows LVEF appears mildly depressed with mild lateral hypokinesis . Left ventricular ejection fraction, by estimation, is 45 to 50%. The left ventricle has moderate to severely decreased function. The left ventricle demonstrates  regional wall motion abnormalities (see scoring diagram/findings for description). Left ventricular diastolic parameters are consistent with Grade II diastolic dysfunction (pseudonormalization).  2. Right ventricular systolic function is normal. The right ventricular size is normal. There  is mildly elevated pulmonary artery systolic pressure.  3. Left atrial size was mildly dilated.  4. Mechanical valve present Peak and mean gradients through the valve are 5 and 3 mm Hg respectively This is similar to previous echo. There is trivial MR. .  5. The aortic valve is abnormal. Aortic valve regurgitation is mild. Mild aortic valve sclerosis is present, with no evidence of aortic valve stenosis.   Labs/Other Tests and Data Reviewed:    EKG:  Intracardiac electrogram shows atrial sensed, biventricular paced rhythm Recent Labs: 10/20/2019: ALT 17 10/28/2019: BUN 19; Creatinine, Ser 1.52; Hemoglobin 8.9; Platelets 172; Potassium 4.4; Sodium 137   Recent Lipid Panel Lab Results  Component Value Date/Time   CHOL 218 (H) 07/29/2018 09:42 AM   TRIG 536 (H) 07/29/2018 09:42 AM   HDL 27 (L) 07/29/2018 09:42 AM   CHOLHDL 8.1 (H) 07/29/2018 09:42 AM   CHOLHDL 8.2 12/09/2006 04:10 AM   LDLCALC Comment 07/29/2018 09:42 AM    12/31/2018 Total cholesterol 235, HDL 36, LDL 112, triglycerides 434  Wt Readings from Last 3 Encounters:  11/09/19 103 lb 9.6 oz (47 kg)  10/27/19 106 lb 14.8 oz (48.5 kg)  07/29/19 107 lb (48.5 kg)     Objective:    Vital Signs:  BP (!) 152/50   Pulse 70   Ht 5' 2"  (1.575 m)   Wt 103 lb 9.6 oz (47 kg)   SpO2 98%   BMI 18.95 kg/m    General: Alert, oriented x3, no distress, very lean Head: no evidence of trauma, PERRL, EOMI, no exophtalmos or lid lag, no myxedema, no xanthelasma; normal ears, nose and oropharynx Neck: normal jugular venous pulsations and no hepatojugular reflux; brisk carotid pulses without delay and no carotid bruits Chest: clear to auscultation, no signs of consolidation by percussion or palpation, normal fremitus, symmetrical and full respiratory excursions Cardiovascular: normal position and quality of the apical impulse, regular rhythm, crisp mechanical prosthesis clicks, faint aortic ejection murmur, no diastolic murmurs, rubs or  gallops Abdomen: no tenderness or distention, no  masses by palpation, no abnormal pulsatility or arterial bruits, normal bowel sounds, no hepatosplenomegaly Extremities: no clubbing, cyanosis or edema; 2+ radial, ulnar and brachial pulses bilaterally; 2+ right femoral, posterior tibial and dorsalis pedis pulses; 2+ left femoral, posterior tibial and dorsalis pedis pulses; no subclavian or femoral bruits Neurological: grossly nonfocal Psych: Normal mood and affect   ASSESSMENT & PLAN:    1. CHF: Clinically euvolemic.  Transient volume overload following shoulder surgery has resolved very quickly.  Not sure we can use thoracic impedance to judge this since the surgical site is so close to the device.  The echo cannot be used to judge filling pressures since she has a mitral valve prosthesis.  EF has reportedly dropped slightly to 45-50%, but on my side-by-side review of her echocardiogram images I do not think there is really been any change. 2. MVR: Normal prosthetic valve function on very recent echocardiogram. 3. Anticoagulation: INR usually in therapeutic range without bleeding complications.  INR 3.1 today. 4. CRT-D: Problems with oversensing on her right ventricular lead and recovery of LV function have led to discontinuation of tachycardia therapies.  She is still getting appropriate resynchronization therapy.  97% biventricular pacing.  She has occasional episodes of junctional rhythm that have not been clinically relevant. 5. AFib: None recorded recently.  She had a handful of episodes of paroxysmal atrial tachycardia earlier this week, asymptomatic.  She is anticoagulated. 6. HLP: Unable to afford Vascepa.  Most recent triglyceride level 434 in July 2020.  On statin. 7. CKD 3: Most recent creatinine 1.52, probably corresponding to her baseline GFR of 30-35.  COVID-19 Education: The signs and symptoms of COVID-19 were discussed with the patient and how to seek care for testing (follow up  with PCP or arrange E-visit).  The importance of social distancing was discussed today.  Medication Adjustments/Labs and Tests Ordered: Current medicines are reviewed at length with the patient today.  Concerns regarding medicines are outlined above.   Tests Ordered: No orders of the defined types were placed in this encounter.   Medication Changes: No orders of the defined types were placed in this encounter.  Patient Instructions  Medication Instructions:  Continue current medications  *If you need a refill on your cardiac medications before your next appointment, please call your pharmacy*   Lab Work: None ordered  Testing/Procedures: None Ordered   Follow-Up: At [facility], you and your health needs are our priority.  As part of our continuing mission to provide you with exceptional heart care, we have created designated Provider Care Teams.  These Care Teams include your primary Cardiologist (physician) and Advanced Practice Providers (APPs -  Physician Assistants and Nurse Practitioners) who all work together to provide you with the care you need, when you need it.  We recommend signing up for the patient portal called "MyChart".  Sign up information is provided on this After Visit Summary.  MyChart is used to connect with patients for Virtual Visits (Telemedicine).  Patients are able to view lab/test results, encounter notes, upcoming appointments, etc.  Non-urgent messages can be sent to your provider as well.   To learn more about what you can do with MyChart, go to [email or web address].    Your next appointment:   1 Year  The format for your next appointment:   In Person  Provider:   You may see Alyson Locket, MD or one of the following Advanced Practice Providers on your designated Care Team:    Almyra Deforest, [place]  Fabian Sharp, PA-C or   Roby Lofts, PA-C       Disposition:  Follow up 12 months  Signed, Sanda Klein, MD  11/13/2019 11:20 AM     [place]

## 2019-11-13 ENCOUNTER — Encounter: Payer: Self-pay | Admitting: Cardiovascular Disease

## 2019-11-20 ENCOUNTER — Other Ambulatory Visit: Payer: Self-pay | Admitting: Cardiovascular Disease

## 2019-11-21 ENCOUNTER — Ambulatory Visit (INDEPENDENT_AMBULATORY_CARE_PROVIDER_SITE_OTHER): Payer: Medicare Other | Admitting: *Deleted

## 2019-11-21 DIAGNOSIS — I5032 Chronic diastolic (congestive) heart failure: Secondary | ICD-10-CM

## 2019-11-21 LAB — CUP PACEART REMOTE DEVICE CHECK
Battery Remaining Longevity: 77 mo
Battery Voltage: 3 V
Brady Statistic AP VP Percent: 16.72 %
Brady Statistic AP VS Percent: 1.5 %
Brady Statistic AS VP Percent: 73.88 %
Brady Statistic AS VS Percent: 7.9 %
Brady Statistic RA Percent Paced: 17.49 %
Brady Statistic RV Percent Paced: 9.03 %
Date Time Interrogation Session: 20210621012402
HighPow Impedance: 87 Ohm
Implantable Lead Implant Date: 20080221
Implantable Lead Implant Date: 20080221
Implantable Lead Implant Date: 20080221
Implantable Lead Location: 753858
Implantable Lead Location: 753859
Implantable Lead Location: 753860
Implantable Lead Model: 4543
Implantable Lead Model: 5076
Implantable Lead Model: 7002
Implantable Lead Serial Number: 134379
Implantable Pulse Generator Implant Date: 20181128
Lead Channel Impedance Value: 323 Ohm
Lead Channel Impedance Value: 380 Ohm
Lead Channel Impedance Value: 456 Ohm
Lead Channel Impedance Value: 456 Ohm
Lead Channel Impedance Value: 456 Ohm
Lead Channel Impedance Value: 760 Ohm
Lead Channel Pacing Threshold Amplitude: 0.75 V
Lead Channel Pacing Threshold Amplitude: 1 V
Lead Channel Pacing Threshold Amplitude: 1 V
Lead Channel Pacing Threshold Pulse Width: 0.4 ms
Lead Channel Pacing Threshold Pulse Width: 0.4 ms
Lead Channel Pacing Threshold Pulse Width: 0.4 ms
Lead Channel Sensing Intrinsic Amplitude: 2 mV
Lead Channel Sensing Intrinsic Amplitude: 2 mV
Lead Channel Sensing Intrinsic Amplitude: 27.625 mV
Lead Channel Sensing Intrinsic Amplitude: 27.625 mV
Lead Channel Setting Pacing Amplitude: 1.5 V
Lead Channel Setting Pacing Amplitude: 1.5 V
Lead Channel Setting Pacing Amplitude: 2 V
Lead Channel Setting Pacing Pulse Width: 0.4 ms
Lead Channel Setting Pacing Pulse Width: 0.4 ms
Lead Channel Setting Sensing Sensitivity: 1.2 mV

## 2019-11-22 NOTE — Progress Notes (Signed)
Remote ICD transmission.   

## 2019-12-07 ENCOUNTER — Ambulatory Visit (INDEPENDENT_AMBULATORY_CARE_PROVIDER_SITE_OTHER): Payer: Medicare Other

## 2019-12-07 ENCOUNTER — Other Ambulatory Visit: Payer: Self-pay

## 2019-12-07 DIAGNOSIS — Z952 Presence of prosthetic heart valve: Secondary | ICD-10-CM

## 2019-12-07 DIAGNOSIS — Z7901 Long term (current) use of anticoagulants: Secondary | ICD-10-CM | POA: Diagnosis not present

## 2019-12-07 LAB — POCT INR: INR: 3.1 — AB (ref 2.0–3.0)

## 2019-12-07 NOTE — Patient Instructions (Signed)
Continue with warfarin 2.5 mg daily except 5 mg each Monday and Friday.  Repeat INR in 5 weeks

## 2019-12-19 ENCOUNTER — Other Ambulatory Visit: Payer: Self-pay | Admitting: Cardiovascular Disease

## 2020-01-11 ENCOUNTER — Ambulatory Visit (INDEPENDENT_AMBULATORY_CARE_PROVIDER_SITE_OTHER): Payer: Medicare Other

## 2020-01-11 ENCOUNTER — Other Ambulatory Visit: Payer: Self-pay

## 2020-01-11 DIAGNOSIS — Z952 Presence of prosthetic heart valve: Secondary | ICD-10-CM

## 2020-01-11 DIAGNOSIS — Z7901 Long term (current) use of anticoagulants: Secondary | ICD-10-CM | POA: Diagnosis not present

## 2020-01-11 LAB — POCT INR: INR: 2.7 (ref 2.0–3.0)

## 2020-01-11 NOTE — Patient Instructions (Signed)
Continue with warfarin 2.5 mg daily except 5 mg each Monday and Friday.  Repeat INR in 6 weeks

## 2020-01-18 ENCOUNTER — Other Ambulatory Visit: Payer: Self-pay | Admitting: Cardiovascular Disease

## 2020-02-13 ENCOUNTER — Other Ambulatory Visit: Payer: Self-pay | Admitting: Internal Medicine

## 2020-02-13 DIAGNOSIS — Z1231 Encounter for screening mammogram for malignant neoplasm of breast: Secondary | ICD-10-CM

## 2020-02-20 ENCOUNTER — Ambulatory Visit (INDEPENDENT_AMBULATORY_CARE_PROVIDER_SITE_OTHER): Payer: Medicare Other | Admitting: *Deleted

## 2020-02-20 DIAGNOSIS — Z95810 Presence of automatic (implantable) cardiac defibrillator: Secondary | ICD-10-CM

## 2020-02-21 LAB — CUP PACEART REMOTE DEVICE CHECK
Battery Remaining Longevity: 75 mo
Battery Voltage: 2.99 V
Brady Statistic AP VP Percent: 67.29 %
Brady Statistic AP VS Percent: 2.57 %
Brady Statistic AS VP Percent: 28.64 %
Brady Statistic AS VS Percent: 1.5 %
Brady Statistic RA Percent Paced: 67.25 %
Brady Statistic RV Percent Paced: 11.43 %
Date Time Interrogation Session: 20210920002204
HighPow Impedance: 85 Ohm
Implantable Lead Implant Date: 20080221
Implantable Lead Implant Date: 20080221
Implantable Lead Implant Date: 20080221
Implantable Lead Location: 753858
Implantable Lead Location: 753859
Implantable Lead Location: 753860
Implantable Lead Model: 4543
Implantable Lead Model: 5076
Implantable Lead Model: 7002
Implantable Lead Serial Number: 134379
Implantable Pulse Generator Implant Date: 20181128
Lead Channel Impedance Value: 342 Ohm
Lead Channel Impedance Value: 399 Ohm
Lead Channel Impedance Value: 456 Ohm
Lead Channel Impedance Value: 494 Ohm
Lead Channel Impedance Value: 494 Ohm
Lead Channel Impedance Value: 779 Ohm
Lead Channel Pacing Threshold Amplitude: 0.625 V
Lead Channel Pacing Threshold Amplitude: 0.875 V
Lead Channel Pacing Threshold Amplitude: 1 V
Lead Channel Pacing Threshold Pulse Width: 0.4 ms
Lead Channel Pacing Threshold Pulse Width: 0.4 ms
Lead Channel Pacing Threshold Pulse Width: 0.4 ms
Lead Channel Sensing Intrinsic Amplitude: 2.5 mV
Lead Channel Sensing Intrinsic Amplitude: 2.5 mV
Lead Channel Sensing Intrinsic Amplitude: 31.625 mV
Lead Channel Sensing Intrinsic Amplitude: 31.625 mV
Lead Channel Setting Pacing Amplitude: 1.5 V
Lead Channel Setting Pacing Amplitude: 1.5 V
Lead Channel Setting Pacing Amplitude: 2 V
Lead Channel Setting Pacing Pulse Width: 0.4 ms
Lead Channel Setting Pacing Pulse Width: 0.4 ms
Lead Channel Setting Sensing Sensitivity: 1.2 mV

## 2020-02-22 NOTE — Progress Notes (Signed)
Remote ICD transmission.   

## 2020-02-29 ENCOUNTER — Other Ambulatory Visit: Payer: Self-pay

## 2020-02-29 ENCOUNTER — Ambulatory Visit (INDEPENDENT_AMBULATORY_CARE_PROVIDER_SITE_OTHER): Payer: Medicare Other

## 2020-02-29 DIAGNOSIS — Z7901 Long term (current) use of anticoagulants: Secondary | ICD-10-CM

## 2020-02-29 DIAGNOSIS — Z952 Presence of prosthetic heart valve: Secondary | ICD-10-CM

## 2020-02-29 LAB — POCT INR: INR: 4.1 — AB (ref 2.0–3.0)

## 2020-02-29 NOTE — Patient Instructions (Signed)
Hold today and then Continue with warfarin 2.5 mg daily except 5 mg each Monday and Friday.  Repeat INR in 4 weeks

## 2020-03-13 ENCOUNTER — Other Ambulatory Visit: Payer: Self-pay | Admitting: Cardiovascular Disease

## 2020-03-28 ENCOUNTER — Ambulatory Visit (INDEPENDENT_AMBULATORY_CARE_PROVIDER_SITE_OTHER): Payer: Medicare Other | Admitting: Pharmacist Clinician (PhC)/ Clinical Pharmacy Specialist

## 2020-03-28 DIAGNOSIS — Z7901 Long term (current) use of anticoagulants: Secondary | ICD-10-CM

## 2020-03-28 DIAGNOSIS — Z952 Presence of prosthetic heart valve: Secondary | ICD-10-CM | POA: Diagnosis not present

## 2020-03-28 LAB — POCT INR: INR: 3 (ref 2.0–3.0)

## 2020-04-13 ENCOUNTER — Ambulatory Visit: Payer: Medicare Other

## 2020-04-16 ENCOUNTER — Other Ambulatory Visit: Payer: Self-pay

## 2020-04-16 ENCOUNTER — Ambulatory Visit
Admission: RE | Admit: 2020-04-16 | Discharge: 2020-04-16 | Disposition: A | Discharge status: Home / Self Care | Payer: Medicare Other | Source: Ambulatory Visit | Attending: Internal Medicine | Admitting: Internal Medicine

## 2020-04-16 DIAGNOSIS — Z1231 Encounter for screening mammogram for malignant neoplasm of breast: Secondary | ICD-10-CM

## 2020-04-25 ENCOUNTER — Other Ambulatory Visit: Payer: Self-pay

## 2020-04-25 ENCOUNTER — Ambulatory Visit (INDEPENDENT_AMBULATORY_CARE_PROVIDER_SITE_OTHER): Payer: Medicare Other

## 2020-04-25 DIAGNOSIS — Z952 Presence of prosthetic heart valve: Secondary | ICD-10-CM

## 2020-04-25 DIAGNOSIS — Z7901 Long term (current) use of anticoagulants: Secondary | ICD-10-CM | POA: Diagnosis not present

## 2020-04-25 LAB — POCT INR: INR: 2.7 (ref 2.0–3.0)

## 2020-04-25 NOTE — Patient Instructions (Signed)
Continue with warfarin 2.5 mg daily except 5 mg each Monday and Friday.  Repeat INR in 6 weeks

## 2020-05-22 ENCOUNTER — Ambulatory Visit (INDEPENDENT_AMBULATORY_CARE_PROVIDER_SITE_OTHER): Payer: Medicare Other

## 2020-05-22 DIAGNOSIS — I5032 Chronic diastolic (congestive) heart failure: Secondary | ICD-10-CM | POA: Diagnosis not present

## 2020-05-22 LAB — CUP PACEART REMOTE DEVICE CHECK
Battery Remaining Longevity: 69 mo
Battery Voltage: 2.98 V
Brady Statistic AP VP Percent: 75.74 %
Brady Statistic AP VS Percent: 2.61 %
Brady Statistic AS VP Percent: 20.01 %
Brady Statistic AS VS Percent: 1.65 %
Brady Statistic RA Percent Paced: 73.9 %
Brady Statistic RV Percent Paced: 22.3 %
Date Time Interrogation Session: 20211221012508
HighPow Impedance: 87 Ohm
Implantable Lead Implant Date: 20080221
Implantable Lead Implant Date: 20080221
Implantable Lead Implant Date: 20080221
Implantable Lead Location: 753858
Implantable Lead Location: 753859
Implantable Lead Location: 753860
Implantable Lead Model: 4543
Implantable Lead Model: 5076
Implantable Lead Model: 7002
Implantable Lead Serial Number: 134379
Implantable Pulse Generator Implant Date: 20181128
Lead Channel Impedance Value: 342 Ohm
Lead Channel Impedance Value: 342 Ohm
Lead Channel Impedance Value: 437 Ohm
Lead Channel Impedance Value: 456 Ohm
Lead Channel Impedance Value: 456 Ohm
Lead Channel Impedance Value: 760 Ohm
Lead Channel Pacing Threshold Amplitude: 0.75 V
Lead Channel Pacing Threshold Amplitude: 0.875 V
Lead Channel Pacing Threshold Amplitude: 1 V
Lead Channel Pacing Threshold Pulse Width: 0.4 ms
Lead Channel Pacing Threshold Pulse Width: 0.4 ms
Lead Channel Pacing Threshold Pulse Width: 0.4 ms
Lead Channel Sensing Intrinsic Amplitude: 2.125 mV
Lead Channel Sensing Intrinsic Amplitude: 2.125 mV
Lead Channel Sensing Intrinsic Amplitude: 31.625 mV
Lead Channel Sensing Intrinsic Amplitude: 31.625 mV
Lead Channel Setting Pacing Amplitude: 1.5 V
Lead Channel Setting Pacing Amplitude: 1.5 V
Lead Channel Setting Pacing Amplitude: 2 V
Lead Channel Setting Pacing Pulse Width: 0.4 ms
Lead Channel Setting Pacing Pulse Width: 0.4 ms
Lead Channel Setting Sensing Sensitivity: 1.2 mV

## 2020-06-05 NOTE — Progress Notes (Signed)
Remote ICD transmission.   

## 2020-06-08 ENCOUNTER — Other Ambulatory Visit: Payer: Self-pay

## 2020-06-08 ENCOUNTER — Ambulatory Visit (INDEPENDENT_AMBULATORY_CARE_PROVIDER_SITE_OTHER): Payer: Medicare Other

## 2020-06-08 DIAGNOSIS — Z952 Presence of prosthetic heart valve: Secondary | ICD-10-CM

## 2020-06-08 DIAGNOSIS — Z7901 Long term (current) use of anticoagulants: Secondary | ICD-10-CM | POA: Diagnosis not present

## 2020-06-08 LAB — POCT INR: INR: 3.4 — AB (ref 2.0–3.0)

## 2020-06-08 NOTE — Patient Instructions (Signed)
Continue with warfarin 2.5 mg daily except 5 mg each Monday and Friday.  Repeat INR in 8 weeks

## 2020-06-25 ENCOUNTER — Other Ambulatory Visit: Payer: Self-pay | Admitting: Cardiovascular Disease

## 2020-07-31 ENCOUNTER — Other Ambulatory Visit: Payer: Self-pay

## 2020-07-31 ENCOUNTER — Encounter: Payer: Self-pay | Admitting: Cardiovascular Disease

## 2020-07-31 ENCOUNTER — Ambulatory Visit (INDEPENDENT_AMBULATORY_CARE_PROVIDER_SITE_OTHER): Payer: Medicare Other | Admitting: Pharmacist Clinician (PhC)/ Clinical Pharmacy Specialist

## 2020-07-31 ENCOUNTER — Ambulatory Visit: Payer: Medicare Other | Admitting: Cardiovascular Disease

## 2020-07-31 VITALS — BP 136/78 | HR 99 | Ht 61.0 in | Wt 99.4 lb

## 2020-07-31 DIAGNOSIS — E782 Mixed hyperlipidemia: Secondary | ICD-10-CM

## 2020-07-31 DIAGNOSIS — I480 Paroxysmal atrial fibrillation: Secondary | ICD-10-CM

## 2020-07-31 DIAGNOSIS — Z952 Presence of prosthetic heart valve: Secondary | ICD-10-CM | POA: Diagnosis not present

## 2020-07-31 DIAGNOSIS — Z7901 Long term (current) use of anticoagulants: Secondary | ICD-10-CM

## 2020-07-31 LAB — LIPID PANEL
Chol/HDL Ratio: 8.7 ratio — ABNORMAL HIGH (ref 0.0–4.4)
Cholesterol, Total: 252 mg/dL — ABNORMAL HIGH (ref 100–199)
HDL: 29 mg/dL — ABNORMAL LOW (ref >39)
LDL Chol Calc (NIH): 116 mg/dL — ABNORMAL HIGH (ref 0–99)
Triglycerides: 602 mg/dL (ref 0–149)
VLDL Cholesterol Cal: 107 mg/dL — ABNORMAL HIGH (ref 5–40)

## 2020-07-31 LAB — HEPATIC FUNCTION PANEL
ALT: 21 IU/L (ref 0–32)
AST: 36 IU/L (ref 0–40)
Albumin: 4.5 g/dL (ref 3.7–4.7)
Alkaline Phosphatase: 112 IU/L (ref 44–121)
Bilirubin Total: 0.4 mg/dL (ref 0.0–1.2)
Bilirubin, Direct: 0.12 mg/dL (ref 0.00–0.40)
Total Protein: 7.5 g/dL (ref 6.0–8.5)

## 2020-07-31 LAB — POCT INR: INR: 2.3 (ref 2.0–3.0)

## 2020-07-31 MED ORDER — AMOXICILLIN 500 MG PO TABS
2000.0000 mg | ORAL_TABLET | Freq: Once | ORAL | 0 refills | Status: AC
Start: 2020-07-31 — End: 2020-07-31

## 2020-07-31 NOTE — Progress Notes (Signed)
07/31/2020 Terrill Wauters   February 04, 1941  [id number]  Primary Physician Leanna Battles, MD Primary Cardiologist: Lorretta Harp MD Garret Reddish, [place], [place]  HPI:  [name] is a 80 y.o.   thin appearing, married, Caucasian female, mother of 4(2 deceased sons), grandmother to eight grandchildren, who I last sawin the office 07/29/2019.[name] Her husband [name] is also a patient of mine and accompanies her today.  He had stage IV lung cancer, which responded to chemotherapy. 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 Berle Mull in 2002 with redo with a [facility] [name] MVR performed by Dr. Gilford Raid in 2007. She had normal coronary arteries. Her EF improved initially from 10% to normal with cardiac resynchronization therapy. Dr. Sallyanne Kuster follows this. She has undergone a generator change by Dr. Sallyanne Kuster on April 22, 2011, and she is followed by [employer]. Recent 2-D echocardiogram performed 9/7/17revealed normal ejection fraction with a normal functioning mitral mechanical prosthesis.She denies chest pain or shortness of breath.  She had a bi-V generator change by Dr. Sallyanne Kuster 04/28/17 after having a Lovenox bridge for her mitral valve. She is otherwise asymptomatic with her most recent 2-D echo performed3/6/2019revealing an EF of 50 to 55%with a well-functioning mitral mechanical prosthesis.  Since I saw her a year ago she is remained stable.  Unfortunately her son he was on a ventilator when I last saw her expired.  She is taking care of her 59 year old grandson.  She denies chest pain or shortness of breath.  Her last 2D echo performed 08/15/2019 revealed low normal LV function at 45 to 50% with a well-functioning mitral mechanical prosthesis.  Current Meds  Medication Sig  . allopurinol (ZYLOPRIM) 300 MG tablet Take 300 mg by mouth every evening.   [name] ALPRAZolam (XANAX) 0.25 MG tablet Take 0.25 mg by mouth at bedtime as  needed for anxiety or sleep.   [name] amoxicillin (AMOXIL) 500 MG tablet Take 4 tablets (2,000 mg total) by mouth once for 1 dose. Antibiotic should be taken 60 minutes prior to dental procedure.  [name] buPROPion (WELLBUTRIN) 100 MG tablet Take 100 mg by mouth daily.  [name] enoxaparin (LOVENOX) 40 MG/0.4ML injection Inject 0.4 mLs (40 mg total) into the skin daily.  [name] esomeprazole (NEXIUM) 20 MG capsule Take 20 mg by mouth daily at 12 noon.  . furosemide (LASIX) 40 MG tablet TAKE 1 TABLET BY MOUTH DAILY. **NEEDS OV FOR FUTURE REFILLS**  . HYDROcodone-acetaminophen (NORCO/VICODIN) 5-325 MG tablet Take 1-2 tablets by mouth every 6 (six) hours as needed for moderate pain.  . metoprolol succinate (TOPROL-XL) 25 MG 24 hr tablet TAKE 1 AND 1/2 TABLETS (37.5 MG TOTAL) BY MOUTH DAILY.  . pravastatin (PRAVACHOL) 40 MG tablet Take 40 mg at bedtime by mouth.   [name] PROAIR HFA 108 (90 BASE) MCG/ACT inhaler Inhale 1-2 puffs into the lungs every 6 (six) hours as needed for wheezing.   . Sennosides (SENNA) 15 MG TABS Take 15 mg by mouth at bedtime.   [name] spironolactone (ALDACTONE) 25 MG tablet TAKE 1 TABLET BY MOUTH EVERY DAY  . SYNTHROID 50 MCG tablet Take 50 mcg by mouth daily.  [name] warfarin (COUMADIN) 2.5 MG tablet TAKE 1 TO 1 & 1/2 TABLETS BY MOUTH DAILY AS DIRECTED BY COUMADIN CLINIC   Current Facility-Administered Medications for the 07/31/20 encounter (Office Visit) with Lorretta Harp, MD  Medication  . 0.9 %  sodium chloride infusion  Allergies  Allergen Reactions  . Ace Inhibitors Cough  . Lipitor [Atorvastatin Calcium] Other (See Comments)    Knee pain.  . Codeine Nausea And Vomiting    Social History   Socioeconomic History  . Marital status: Married    Spouse name: [name]  . Number of children: 4  . Years of education: 70  . Highest education level: Not on file  Occupational History  . Occupation: retired  Tobacco Use  . Smoking status: Former Smoker    Types: Cigarettes    Quit date: 06/02/2001     Years since quitting: 19.1  . Smokeless tobacco: Never Used  Vaping Use  . Vaping Use: Never used  Substance and Sexual Activity  . Alcohol use: Yes    Alcohol/week: 0.0 standard drinks    Comment: social-1 every 6 months  . Drug use: No  . Sexual activity: Not on file  Other Topics Concern  . Not on file  Social History Narrative   Drinks very little caffeine    Social Determinants of Health   Financial Resource Strain: Not on file  Food Insecurity: Not on file  Transportation Needs: Not on file  Physical Activity: Not on file  Stress: Not on file  Social Connections: Not on file  Intimate Partner Violence: Not on file     Review of Systems: General: negative for chills, fever, night sweats or weight changes.  Cardiovascular: negative for chest pain, dyspnea on exertion, edema, orthopnea, palpitations, paroxysmal nocturnal dyspnea or shortness of breath Dermatological: negative for rash Respiratory: negative for cough or wheezing Urologic: negative for hematuria Abdominal: negative for nausea, vomiting, diarrhea, bright red blood per rectum, melena, or hematemesis Neurologic: negative for visual changes, syncope, or dizziness All other systems reviewed and are otherwise negative except as noted above.    Blood pressure 136/78, pulse 99, height 5' 1"  (1.549 m), weight 99 lb 6.4 oz (45.1 kg).  General appearance: alert and no distress Neck: no adenopathy, no carotid bruit, no JVD, supple, symmetrical, trachea midline and thyroid not enlarged, symmetric, no tenderness/mass/nodules Lungs: clear to auscultation bilaterally Heart: Crisp mitral mechanical valve sounds Extremities: extremities normal, atraumatic, no cyanosis or edema Pulses: 2+ and symmetric Skin: Skin color, texture, turgor normal. No rashes or lesions Neurologic: Alert and oriented X 3, normal strength and tone. Normal symmetric reflexes. Normal coordination and gait  EKG AV paced rhythm at 75.  I  personally reviewed this EKG.  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 Emily Filbert her last 2D echo performed 08/15/2019 revealed a slightly lower EF of 45 to 50% with a well-functioning mitral mechanical prosthesis with trivial MR.  [place] in 2002.  She had redo [facility] [name] MVR performed Dr. Cyndia Bent in 2007.  She had normal coronary arteries at that time.  Her ejection fraction initially went from 10% to normal with cardiac resynchronization therapy which Dr. Sallyanne Kuster followed.  She had a GEN change 04/22/2011 and a BiV generator change by Dr. Sallyanne Kuster 04/28/2017.  HYPERLIPIDEMIA History of hyperlipidemia on statin therapy with lipid profile performed 01/02/2020 revealing total cholesterol 214, LDL of 103, HDL 36 and triglyceride level of 512.  I am going to recheck a lipid liver profile.  If her triglyceride level remains elevated I may refer her to Dr. Debara Pickett for further management.      Lorretta Harp MD FACP,FACC,FAHA, [facility] 07/31/2020 10:49 AM

## 2020-07-31 NOTE — Assessment & Plan Note (Signed)
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 Emily Filbert her last 2D echo performed 08/15/2019 revealed a slightly lower EF of 45 to 50% with a well-functioning mitral mechanical prosthesis with trivial MR.  [place] in 2002.  She had redo [facility] [name] MVR performed Dr. Cyndia Bent in 2007.  She had normal coronary arteries at that time.  Her ejection fraction initially went from 10% to normal with cardiac resynchronization therapy which Dr. Sallyanne Kuster followed.  She had a GEN change 04/22/2011 and a BiV generator change by Dr. Sallyanne Kuster 04/28/2017.

## 2020-07-31 NOTE — Assessment & Plan Note (Signed)
History of hyperlipidemia on statin therapy with lipid profile performed 01/02/2020 revealing total cholesterol 214, LDL of 103, HDL 36 and triglyceride level of 512.  I am going to recheck a lipid liver profile.  If her triglyceride level remains elevated I may refer her to Dr. Debara Pickett for further management.

## 2020-07-31 NOTE — Patient Instructions (Signed)
Medication Instructions:   -Amoxicillin 2,038m given for dental procedure.   Your physician recommends that you continue on your current medications as directed. Please refer to the Current Medication list given to you today.  *If you need a refill on your cardiac medications before your next appointment, please call your pharmacy*   Lab Work: Your physician recommends that you have labs drawn today: lipid/liver proflie  If you have labs (blood work) drawn today and your tests are completely normal, you will receive your results only by: .Marland KitchenMyChart Message (if you have MyChart) OR . A paper copy in the mail If you have any lab test that is abnormal or we need to change your treatment, we will call you to review the results.   Follow-Up: [facility] and your [facility] needs are our [facility].  As part of our continuing mission to provide you with exceptional heart care, we have created designated Provider Care Teams.  These Care Teams include your primary Cardiologist (physician) and Advanced Practice Providers (APPs -  Physician Assistants and Nurse Practitioners) who all work together to provide you with the care you need, when you need it.  We recommend signing up for the patient portal called "MyChart".  Sign up information is provided on this After Visit Summary.  MyChart is used to connect with patients for Virtual Visits (Telemedicine).  Patients are able to view lab/test results, encounter notes, upcoming appointments, etc.  Non-urgent messages can be sent to your provider as well.   To learn more about what you can do with MyChart, go to [email or web address]    Your next appointment:   12 month(s)  The format for your next appointment:   In Person  Provider:   JQuay Burow MD

## 2020-08-01 ENCOUNTER — Other Ambulatory Visit: Payer: Self-pay | Admitting: Cardiovascular Disease

## 2020-08-15 ENCOUNTER — Telehealth: Payer: Self-pay

## 2020-08-15 DIAGNOSIS — E782 Mixed hyperlipidemia: Secondary | ICD-10-CM

## 2020-08-15 NOTE — Telephone Encounter (Signed)
Pt called regarding recently drawn lipid panel. Explained to pt that all cholesterol numbers are high and that Dr. Gwenlyn Found would like to refer her to the [facility] with Dr. Debara Pickett. Pt is agreeable to the plan and is appreciative of the phone call. Orders placed.

## 2020-08-15 NOTE — Telephone Encounter (Signed)
-----   Message from Lorretta Harp, MD sent at 08/04/2020  4:56 PM EST ----- Refer to [facility] for increased Trigs

## 2020-08-16 ENCOUNTER — Other Ambulatory Visit: Payer: Self-pay

## 2020-08-16 ENCOUNTER — Ambulatory Visit (HOSPITAL_COMMUNITY): Payer: Medicare Other | Attending: Cardiology

## 2020-08-16 ENCOUNTER — Encounter (INDEPENDENT_AMBULATORY_CARE_PROVIDER_SITE_OTHER): Payer: Self-pay

## 2020-08-16 DIAGNOSIS — Z952 Presence of prosthetic heart valve: Secondary | ICD-10-CM

## 2020-08-16 DIAGNOSIS — I4891 Unspecified atrial fibrillation: Secondary | ICD-10-CM | POA: Insufficient documentation

## 2020-08-16 DIAGNOSIS — I341 Nonrheumatic mitral (valve) prolapse: Secondary | ICD-10-CM | POA: Insufficient documentation

## 2020-08-16 DIAGNOSIS — Z950 Presence of cardiac pacemaker: Secondary | ICD-10-CM | POA: Diagnosis not present

## 2020-08-16 DIAGNOSIS — I509 Heart failure, unspecified: Secondary | ICD-10-CM | POA: Diagnosis not present

## 2020-08-16 DIAGNOSIS — I110 Hypertensive heart disease with heart failure: Secondary | ICD-10-CM | POA: Insufficient documentation

## 2020-08-16 DIAGNOSIS — E785 Hyperlipidemia, unspecified: Secondary | ICD-10-CM | POA: Diagnosis not present

## 2020-08-16 LAB — ECHOCARDIOGRAM COMPLETE
Area-P 1/2: 3.6 cm2
MV VTI: 0.71 cm2
P 1/2 time: 250 msec
S' Lateral: 3.4 cm

## 2020-08-21 ENCOUNTER — Ambulatory Visit (INDEPENDENT_AMBULATORY_CARE_PROVIDER_SITE_OTHER): Payer: Medicare Other

## 2020-08-21 DIAGNOSIS — Z950 Presence of cardiac pacemaker: Secondary | ICD-10-CM

## 2020-08-21 LAB — CUP PACEART REMOTE DEVICE CHECK
Battery Remaining Longevity: 62 mo
Battery Voltage: 2.98 V
Brady Statistic AP VP Percent: 77.68 %
Brady Statistic AP VS Percent: 2.36 %
Brady Statistic AS VP Percent: 18.61 %
Brady Statistic AS VS Percent: 1.35 %
Brady Statistic RA Percent Paced: 74.66 %
Brady Statistic RV Percent Paced: 31.99 %
Date Time Interrogation Session: 20220322033523
HighPow Impedance: 90 Ohm
Implantable Lead Implant Date: 20080221
Implantable Lead Implant Date: 20080221
Implantable Lead Implant Date: 20080221
Implantable Lead Location: 753858
Implantable Lead Location: 753859
Implantable Lead Location: 753860
Implantable Lead Model: 4543
Implantable Lead Model: 5076
Implantable Lead Model: 7002
Implantable Lead Serial Number: 134379
Implantable Pulse Generator Implant Date: 20181128
Lead Channel Impedance Value: 323 Ohm
Lead Channel Impedance Value: 342 Ohm
Lead Channel Impedance Value: 399 Ohm
Lead Channel Impedance Value: 456 Ohm
Lead Channel Impedance Value: 456 Ohm
Lead Channel Impedance Value: 779 Ohm
Lead Channel Pacing Threshold Amplitude: 0.625 V
Lead Channel Pacing Threshold Amplitude: 0.875 V
Lead Channel Pacing Threshold Amplitude: 0.875 V
Lead Channel Pacing Threshold Pulse Width: 0.4 ms
Lead Channel Pacing Threshold Pulse Width: 0.4 ms
Lead Channel Pacing Threshold Pulse Width: 0.4 ms
Lead Channel Sensing Intrinsic Amplitude: 2.375 mV
Lead Channel Sensing Intrinsic Amplitude: 2.375 mV
Lead Channel Sensing Intrinsic Amplitude: 31.625 mV
Lead Channel Sensing Intrinsic Amplitude: 31.625 mV
Lead Channel Setting Pacing Amplitude: 1.5 V
Lead Channel Setting Pacing Amplitude: 1.5 V
Lead Channel Setting Pacing Amplitude: 2 V
Lead Channel Setting Pacing Pulse Width: 0.4 ms
Lead Channel Setting Pacing Pulse Width: 0.4 ms
Lead Channel Setting Sensing Sensitivity: 1.2 mV

## 2020-08-27 ENCOUNTER — Other Ambulatory Visit: Payer: Self-pay

## 2020-08-27 ENCOUNTER — Ambulatory Visit (INDEPENDENT_AMBULATORY_CARE_PROVIDER_SITE_OTHER): Payer: Medicare Other

## 2020-08-27 DIAGNOSIS — Z7901 Long term (current) use of anticoagulants: Secondary | ICD-10-CM | POA: Diagnosis not present

## 2020-08-27 DIAGNOSIS — Z952 Presence of prosthetic heart valve: Secondary | ICD-10-CM

## 2020-08-27 LAB — POCT INR: INR: 2.7 (ref 2.0–3.0)

## 2020-08-27 NOTE — Patient Instructions (Signed)
continue with warfarin 2.5 mg daily except 5 mg each Monday and Friday.  Repeat INR in 6 weeks

## 2020-08-30 ENCOUNTER — Encounter: Payer: Self-pay | Admitting: Internal Medicine

## 2020-08-30 ENCOUNTER — Telehealth (INDEPENDENT_AMBULATORY_CARE_PROVIDER_SITE_OTHER): Payer: Medicare Other | Admitting: Internal Medicine

## 2020-08-30 VITALS — BP 131/68 | HR 78 | Temp 97.7°F | Wt 99.2 lb

## 2020-08-30 DIAGNOSIS — E7801 Familial hypercholesterolemia: Secondary | ICD-10-CM | POA: Diagnosis not present

## 2020-08-30 DIAGNOSIS — Z952 Presence of prosthetic heart valve: Secondary | ICD-10-CM

## 2020-08-30 DIAGNOSIS — Z95810 Presence of automatic (implantable) cardiac defibrillator: Secondary | ICD-10-CM | POA: Diagnosis not present

## 2020-08-30 DIAGNOSIS — E781 Pure hyperglyceridemia: Secondary | ICD-10-CM

## 2020-08-30 DIAGNOSIS — E782 Mixed hyperlipidemia: Secondary | ICD-10-CM

## 2020-08-30 MED ORDER — ICOSAPENT ETHYL 1 G PO CAPS
2.0000 g | ORAL_CAPSULE | Freq: Two times a day (BID) | ORAL | 11 refills | Status: DC
Start: 2020-08-30 — End: 2021-08-19

## 2020-08-30 NOTE — Progress Notes (Signed)
Virtual Visit via Telephone Note   This visit type was conducted due to national recommendations for restrictions regarding the COVID-19 Pandemic (e.g. social distancing) in an effort to limit this patient's exposure and mitigate transmission in our community.  Due to her co-morbid illnesses, this patient is at least at moderate risk for complications without adequate follow up.  This format is felt to be most appropriate for this patient at this time.  The patient did not have access to video technology/had technical difficulties with video requiring transitioning to audio format only (telephone).  All issues noted in this document were discussed and addressed.  No physical exam could be performed with this format.  Please refer to the patient's chart for her  consent to telehealth for [facility].   Date:  08/30/2020   ID:  [name], DOB 08/10/1940, MRN [id number] The patient was identified using 2 identifiers.  Evaluation Performed:  New Patient Evaluation  Patient Location:  [address] [place] [address]  Provider location:   [address], [place] [address] [place], [place] [address]  PCP:  Leanna Battles, MD  Cardiologist:  Quay Burow, MD Electrophysiologist:  Sanda Klein, MD   Chief Complaint:  High triglycerides  History of Present Illness:    [name] is a 80 y.o. female who presents via audio/video conferencing for a telehealth visit today.  This is a pleasant 80 year old female with a history of valvular heart disease status post mitral valve repair and then replacement last in 2007.  At the time she had no significant coronary artery disease.  She does have a history of congestive heart failure, chronic kidney disease, biventricular AICD placement followed by Dr. Loletha Grayer and family history of heart disease.  She reports a longstanding history of high triglycerides which was present in multiple family members.  She has been on pravastatin 40  mg.  More recently she was referred for evaluation and management of high triglycerides which do seem to be much higher than they had been in the past.  Her most recent lipid profile showed total cholesterol 252, triglycerides 602, HDL 29 and LDL 116.  She is nearly underweight at 99 pounds with a BMI of 18.74 and reports a very restricted diet.  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  The patient does not have symptoms concerning for COVID-19 infection (fever, chills, cough, or new SHORTNESS OF BREATH).    Prior CV studies:   The following studies were reviewed today:  Chart reviewed, lab work  PMHx:  Past Medical History:  Diagnosis Date  . Allergy    seasonal  . Anemia   . Anxiety   . Arthritis   . Asthma   . 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 Protecta  . Biventricular ICD (implantable cardioverter-defibrillator) in place    with CRT  . Blood transfusion without reported diagnosis   . Bursitis   . Cataract    RIGHT EYE  . CHF (congestive heart failure) ([place])   . Chronic kidney disease   . Colon polyp    adenomatous  . Complication of anesthesia    patient stated that had difficulty getting the breathing tube removed, patient said that she stopped breathing and HR dropped to 10  patient then woke up and started breathing pateint stated no longer than one minute; re-intubated in PACU  following cholecystectomy 10/28/13  . COPD (chronic obstructive pulmonary disease) ([place])   . Depression   . Diverticulosis   . Dysrhythmia   . Fibromyalgia   . GERD (gastroesophageal reflux disease)   . Gout   . H/O mitral valve replacement 2002, 2007  . Heart murmur   . Hemorrhoids   . Hyperlipidemia   . Hypertension   . Hypothyroidism   . IBS (irritable bowel syndrome)   . MVP (mitral valve  prolapse)   . Neuromuscular disorder (HCC)    fibromyalgia  . Pacemaker   . Peptic ulcer disease     Past Surgical History:  Procedure Laterality Date  . ABDOMINAL HYSTERECTOMY    . BIV ICD GENERATOR CHANGEOUT N/A 04/29/2017   Procedure: BIV ICD GENERATOR CHANGEOUT;  Surgeon: Sanda Klein, MD;  Location: [facility];  Service: Cardiovascular;  Laterality: N/A;  . CARDIAC CATHETERIZATION  02/25/2006   normal left main, normal LAD, normal L Cfx, normal/dominant RCA (Dr. [name])  . CARDIAC DEFIBRILLATOR PLACEMENT  2007, 11/202012   x2 (pacemaker) (Dr. Jerilynn Mages. Cro[place])  . CARDIAC VALVE REPLACEMENT  2002   MV repair - Dr. Berle Mull  . CHOLECYSTECTOMY N/A 10/28/2013   Procedure: LAPAROSCOPIC CHOLECYSTECTOMY WITH INTRAOPERATIVE CHOLANGIOGRAM;  Surgeon: Edward Jolly, MD;  Location: [facility];  Service: General;  Laterality: N/A;  . COLONOSCOPY    . EYE SURGERY    . IMPLANTABLE CARDIOVERTER DEFIBRILLATOR (ICD) GENERATOR CHANGE N/A 04/10/2011   Procedure: ICD GENERATOR CHANGE;  Surgeon: Sanda Klein, MD;  Location: [facility];  Service: Cardiovascular;  Laterality: N/A;  . INSERT / REPLACE / REMOVE PACEMAKER    . KNEE ARTHROSCOPY    . MITRAL VALVE REPLACEMENT  02/26/2006   re-do MVR w/21m [employer]. [name] (Dr. BEllison Hughs  . NM MYOCAR PERF WALL MOTION  2005   persantine myoview - low ris, EF 63%  . RIGHT HEART CATH  04/03/2006   pulm cap wedge pressure 24/24, PA pressure 43/22 (mean 342mg), CO 4.8, CI 4.1 (Dr. J.Jackie Plum . TOTAL SHOULDER ARTHROPLASTY Right 12/27/2015   Procedure: RIGHT TOTAL SHOULDER ARTHROPLASTY;  Surgeon: JuTania AdeMD;  [facility] Service: Orthopedics;  Laterality: Right;  Right total shoulder arthroplasty  . TOTAL SHOULDER ARTHROPLASTY Left 10/27/2019   Procedure: REVERSE TOTAL SHOULDER ARTHROPLASTY;  Surgeon: ChTania AdeMD;  Location: [facility];  Service: Orthopedics;  Laterality: Left;  . TRANSTHORACIC ECHOCARDIOGRAM  12/2011   EF 50-55%, mild  global hypokinesis; LA severely dilated; calcification of anterior/posterior MV leaflets, bi-leaflets St. [name] mechanical MV; mild TR; trace AV regurg/pulm valve regurg    FAMHx:  Family History  Problem Relation Age of Onset  . Breast cancer Sister        multiple  . Uterine cancer Sister        multiple  . Heart disease Mother   . Heart attack Mother   . Heart disease Father   . Kidney disease Father   . Heart attack Father   . Heart disease Sister   . Crohn's disease Sister   . Heart disease Son   . Colon cancer Neg Hx   . Stomach cancer Neg Hx     SOCHx:   reports that she quit smoking about 19 years ago. Her smoking use included cigarettes. She has never used smokeless tobacco. She reports current alcohol use. She reports that she does not use drugs.  ALLERGIES:  Allergies  Allergen Reactions  . Ace Inhibitors Cough  .  Lipitor [Atorvastatin Calcium] Other (See Comments)    Knee pain.  . Codeine Nausea And Vomiting    MEDS:  Current Meds  Medication Sig  . allopurinol (ZYLOPRIM) 300 MG tablet Take 300 mg by mouth every evening.   [name] ALPRAZolam (XANAX) 0.25 MG tablet Take 0.25 mg by mouth at bedtime as needed for anxiety or sleep.   [name] buPROPion (WELLBUTRIN) 100 MG tablet Take 100 mg by mouth daily.  [name] esomeprazole (NEXIUM) 20 MG capsule Take 20 mg by mouth daily at 12 noon.  . furosemide (LASIX) 40 MG tablet TAKE 1 TABLET BY MOUTH DAILY. **NEEDS OV FOR FUTURE REFILLS**  . icosapent Ethyl (VASCEPA) 1 g capsule Take 2 capsules (2 g total) by mouth 2 (two) times daily.  . metoprolol succinate (TOPROL-XL) 25 MG 24 hr tablet TAKE 1 AND 1/2 TABLETS (37.5 MG TOTAL) BY MOUTH DAILY.  . pravastatin (PRAVACHOL) 40 MG tablet Take 40 mg at bedtime by mouth.   [name] PROAIR HFA 108 (90 BASE) MCG/ACT inhaler Inhale 1-2 puffs into the lungs every 6 (six) hours as needed for wheezing.   . Sennosides (SENNA) 15 MG TABS Take 15 mg by mouth at bedtime.   [name] spironolactone (ALDACTONE) 25 MG  tablet TAKE 1 TABLET BY MOUTH EVERY DAY  . SYNTHROID 50 MCG tablet Take 50 mcg by mouth daily.  [name] warfarin (COUMADIN) 2.5 MG tablet TAKE 1 TO 1 & 1/2 TABLETS BY MOUTH DAILY AS DIRECTED BY COUMADIN CLINIC   Current Facility-Administered Medications for the 08/30/20 encounter (Video Visit) with Pixie Casino, MD  Medication  . 0.9 %  sodium chloride infusion     ROS: Pertinent items noted in HPI and remainder of comprehensive ROS otherwise negative.  Labs/Other Tests and Data Reviewed:    Recent Labs: 10/28/2019: BUN 19; Creatinine, Ser 1.52; Hemoglobin 8.9; Platelets 172; Potassium 4.4; Sodium 137 07/31/2020: ALT 21   Recent Lipid Panel Lab Results  Component Value Date/Time   CHOL 252 (H) 07/31/2020 11:05 AM   TRIG 602 (HH) 07/31/2020 11:05 AM   HDL 29 (L) 07/31/2020 11:05 AM   CHOLHDL 8.7 (H) 07/31/2020 11:05 AM   CHOLHDL 8.2 12/09/2006 04:10 AM   LDLCALC 116 (H) 07/31/2020 11:05 AM    Wt Readings from Last 3 Encounters:  08/30/20 99 lb 3.2 oz (45 kg)  07/31/20 99 lb 6.4 oz (45.1 kg)  11/09/19 103 lb 9.6 oz (47 kg)     Exam:    Vital Signs:  BP 131/68   Pulse 78   Temp 97.7 F (36.5 C)   Wt 99 lb 3.2 oz (45 kg)   BMI 18.74 kg/m    Exam deferred due to telephone visit  ASSESSMENT & PLAN:    1. Familial hypertriglyceridemia 2. 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  Is not clear whether this increases her risk of coronary disease or not although there are several family members with early onset heart disease or have died because of that.  She does have a marked dyslipidemia which also potentially could increase her risk for pancreatitis.  She is already on pravastatin although her LDL is still elevated.  Would recommend adding Vascepa to lower triglycerides and she may benefit from a switch to a high potency statin such as rosuvastatin (apparently she could not tolerate atorvastatin due to  knee pain in the past).  With repeat lipids in about 3 to 4 months.  Thanks again  for the kind referral.  COVID-19 Education: The signs and symptoms of COVID-19 were discussed with the patient and how to seek care for testing (follow up with PCP or arrange E-visit).  The importance of social distancing was discussed today.  Patient Risk:   After full review of this patients clinical status, I feel that they are at least moderate risk at this time.  Time:   Today, I have spent 25 minutes with the patient with telehealth technology discussing dyslipidemia, familial hypertriglyceridemia, cardiovascular disease history.     Medication Adjustments/Labs and Tests Ordered: Current medicines are reviewed at length with the patient today.  Concerns regarding medicines are outlined above.   Tests Ordered: Orders Placed This Encounter  Procedures  . Lipid panel    Medication Changes: Meds ordered this encounter  Medications  . icosapent Ethyl (VASCEPA) 1 g capsule    Sig: Take 2 capsules (2 g total) by mouth 2 (two) times daily.    Dispense:  120 capsule    Refill:  11    Generate PA request in [email or web address] if needed    Disposition:  in 3 month(s)  Pixie Casino, MD, [facility], [place] Director of the Advanced Lipid Disorders &  Cardiovascular Risk Reduction Clinic Diplomate of the American Board of Clinical Lipidology Attending Cardiologist  Direct Dial: [phone]  Fax: [phone]  Website:  [email or web address]  Pixie Casino, MD  08/30/2020 9:38 AM

## 2020-08-30 NOTE — Patient Instructions (Signed)
Medication Instructions:  START vascepa 2 capsules twice daily   Patient Assistance:  The Health Well foundation offers assistance to help pay for medication copays.  They will cover copays for all cholesterol lowering meds, including statins, fibrates, omega-3 oils, ezetimibe, Repatha, Praluent, Nexletol, Nexlizet.  The cards are usually good for $2,500 or 12 months, whichever comes first. 1. Go to healthwellfoundation.org 2. Click on "Apply Now" 3. Answer questions as to whom is applying (patient or representative) 4. Your disease fund will be "hypercholesterolemia - Medicare access" 5. They will ask questions about finances and which medications you are taking for cholesterol 6. When you submit, the approval is usually within minutes.  You will need to print the card information from the site 7. You will need to show this information to your pharmacy, they will bill your Medicare Part D plan first -then bill Health Well --for the copay.   You can also call them at [phone], although the hold times can be quite long.   Thank you for choosing [facility]   *If you need a refill on your cardiac medications before your next appointment, please call your pharmacy*   Lab Work: FASTING lab work in 3-4 months to check cholesterol  If you have labs (blood work) drawn today and your tests are completely normal, you will receive your results only by: [name] MyChart Message (if you have MyChart) OR . A paper copy in the mail If you have any lab test that is abnormal or we need to change your treatment, we will call you to review the results.  Follow-Up: At [facility], you and your health needs are our priority.  As part of our continuing mission to provide you with exceptional heart care, we have created designated Provider Care Teams.  These Care Teams include your primary Cardiologist (physician) and Advanced Practice Providers (APPs -  Physician Assistants and Nurse Practitioners) who all  work together to provide you with the care you need, when you need it.  We recommend signing up for the patient portal called "MyChart".  Sign up information is provided on this After Visit Summary.  MyChart is used to connect with patients for Virtual Visits (Telemedicine).  Patients are able to view lab/test results, encounter notes, upcoming appointments, etc.  Non-urgent messages can be sent to your provider as well.   To learn more about what you can do with MyChart, go to [email or web address].    Your next appointment:   3-4 month(s) - lipid clinic  The format for your next appointment:   In Person  Provider:   K. Mali Hilty, MD   Other Instructions

## 2020-08-30 NOTE — Progress Notes (Signed)
Thank you :)

## 2020-08-30 NOTE — Progress Notes (Signed)
Remote ICD transmission.   

## 2020-08-31 ENCOUNTER — Telehealth: Payer: Self-pay | Admitting: Cardiovascular Disease

## 2020-08-31 NOTE — Telephone Encounter (Signed)
Patient called and said that she is struggling to pay for her medication so may no longer be able to pay for it. Wants to know if there are any grants out there that can help her. Please call back

## 2020-09-03 NOTE — Telephone Encounter (Signed)
Left message for patient to return call regarding medication cost assistance, as noted below  Patient Assistance:  The Health Well foundation offers assistance to help pay for medication copays.  They will cover copays for all cholesterol lowering meds, including statins, fibrates, omega-3 oils, ezetimibe, Repatha, Praluent, Nexletol, Nexlizet.  The cards are usually good for $2,500 or 12 months, whichever comes first. 1. Go to healthwellfoundation.org 2. Click on "Apply Now" 3. Answer questions as to whom is applying (patient or representative) 4. Your disease fund will be "hypercholesterolemia - Medicare access" 5. They will ask questions about finances and which medications you are taking for cholesterol 6. When you submit, the approval is usually within minutes.  You will need to print the card information from the site 7. You will need to show this information to your pharmacy, they will bill your Medicare Part D plan first -then bill Health Well --for the copay.   You can also call them at [phone], although the hold times can be quite long.

## 2020-09-03 NOTE — Telephone Encounter (Signed)
Spoke with pt, she is having trouble affording the vascepa and she was told by dr Debara Pickett to let jenna know if she would need a grant. Aware will forward to jenna to contact her.

## 2020-09-04 ENCOUNTER — Telehealth: Payer: Self-pay | Admitting: Internal Medicine

## 2020-09-04 NOTE — Telephone Encounter (Signed)
Pt c/o medication issue:  1. Name of Medication: icosapent Ethyl (VASCEPA) 1 g capsule  2. How are you currently taking this medication (dosage and times per day)? As prescribed   3. Are you having a reaction (difficulty breathing--STAT)? No   4. What is your medication issue? [name] is calling wanting to speak with a nurse in regards to receiving a grant for this medication to help with the cost. Please advise.

## 2020-09-05 NOTE — Telephone Encounter (Signed)
Completed [place] application with patient on the phone  HealthWell Id [id number]  Pre-approval Date 09/05/2020 Fund Hypercholesterolemia - Medicare Access  Start Date 08/06/2020 Assistance Type Co-pay End Date 08/05/2021      Pharmacy Card Information Pharmacy Card Id [id number]  Group [id number] PCN [id number]  BIN [id number] PROCESSOR [id number]  Card Activation Status OK-2020-09-05 14:21:59 Financial Summary Information Grant Cap Amount $2,500.00 Paid Amount $0.00 Grant Balance $2,500.00 Total Payments In Progress $0.00 Last Paid Date   Total Paper Paid $0.00

## 2020-09-05 NOTE — Telephone Encounter (Signed)
Spoke with patient regarding [employer]. Documented in another encounter

## 2020-09-27 ENCOUNTER — Other Ambulatory Visit: Payer: Self-pay | Admitting: Cardiovascular Disease

## 2020-10-01 ENCOUNTER — Encounter: Payer: Medicare Other | Admitting: Obstetrics & Gynecology

## 2020-10-08 ENCOUNTER — Ambulatory Visit (INDEPENDENT_AMBULATORY_CARE_PROVIDER_SITE_OTHER): Payer: Medicare Other

## 2020-10-08 ENCOUNTER — Other Ambulatory Visit: Payer: Self-pay

## 2020-10-08 DIAGNOSIS — Z952 Presence of prosthetic heart valve: Secondary | ICD-10-CM

## 2020-10-08 DIAGNOSIS — Z7901 Long term (current) use of anticoagulants: Secondary | ICD-10-CM | POA: Diagnosis not present

## 2020-10-08 LAB — POCT INR: INR: 4.8 — AB (ref 2.0–3.0)

## 2020-10-08 NOTE — Patient Instructions (Signed)
Hold tonight only and then continue with warfarin 2.5 mg daily except 5 mg each Monday and Friday.  Repeat INR in 4 weeks

## 2020-11-05 ENCOUNTER — Ambulatory Visit (INDEPENDENT_AMBULATORY_CARE_PROVIDER_SITE_OTHER): Payer: Medicare Other

## 2020-11-05 ENCOUNTER — Other Ambulatory Visit: Payer: Self-pay

## 2020-11-05 DIAGNOSIS — Z7901 Long term (current) use of anticoagulants: Secondary | ICD-10-CM | POA: Diagnosis not present

## 2020-11-05 DIAGNOSIS — Z952 Presence of prosthetic heart valve: Secondary | ICD-10-CM

## 2020-11-05 LAB — POCT INR: INR: 2.7 (ref 2.0–3.0)

## 2020-11-05 NOTE — Patient Instructions (Signed)
continue with warfarin 2.5 mg daily except 5 mg each Monday and Friday.  Repeat INR in 3 weeks

## 2020-11-20 ENCOUNTER — Ambulatory Visit (INDEPENDENT_AMBULATORY_CARE_PROVIDER_SITE_OTHER): Payer: Medicare Other

## 2020-11-20 DIAGNOSIS — I5032 Chronic diastolic (congestive) heart failure: Secondary | ICD-10-CM | POA: Diagnosis not present

## 2020-11-21 LAB — CUP PACEART REMOTE DEVICE CHECK
Battery Remaining Longevity: 54 mo
Battery Voltage: 2.98 V
Brady Statistic AP VP Percent: 69.03 %
Brady Statistic AP VS Percent: 3.87 %
Brady Statistic AS VP Percent: 24.02 %
Brady Statistic AS VS Percent: 3.09 %
Brady Statistic RA Percent Paced: 63.39 %
Brady Statistic RV Percent Paced: 46.39 %
Date Time Interrogation Session: 20220622115226
HighPow Impedance: 96 Ohm
Implantable Lead Implant Date: 20080221
Implantable Lead Implant Date: 20080221
Implantable Lead Implant Date: 20080221
Implantable Lead Location: 753858
Implantable Lead Location: 753859
Implantable Lead Location: 753860
Implantable Lead Model: 4543
Implantable Lead Model: 5076
Implantable Lead Model: 7002
Implantable Lead Serial Number: 134379
Implantable Pulse Generator Implant Date: 20181128
Lead Channel Impedance Value: 323 Ohm
Lead Channel Impedance Value: 342 Ohm
Lead Channel Impedance Value: 437 Ohm
Lead Channel Impedance Value: 437 Ohm
Lead Channel Impedance Value: 456 Ohm
Lead Channel Impedance Value: 760 Ohm
Lead Channel Pacing Threshold Amplitude: 0.625 V
Lead Channel Pacing Threshold Amplitude: 1 V
Lead Channel Pacing Threshold Amplitude: 1.25 V
Lead Channel Pacing Threshold Pulse Width: 0.4 ms
Lead Channel Pacing Threshold Pulse Width: 0.4 ms
Lead Channel Pacing Threshold Pulse Width: 0.4 ms
Lead Channel Sensing Intrinsic Amplitude: 2.625 mV
Lead Channel Sensing Intrinsic Amplitude: 2.625 mV
Lead Channel Sensing Intrinsic Amplitude: 31.625 mV
Lead Channel Sensing Intrinsic Amplitude: 31.625 mV
Lead Channel Setting Pacing Amplitude: 1.5 V
Lead Channel Setting Pacing Amplitude: 1.5 V
Lead Channel Setting Pacing Amplitude: 2.5 V
Lead Channel Setting Pacing Pulse Width: 0.4 ms
Lead Channel Setting Pacing Pulse Width: 0.4 ms
Lead Channel Setting Sensing Sensitivity: 1.2 mV

## 2020-11-23 LAB — LIPID PANEL
Chol/HDL Ratio: 7.3 ratio — ABNORMAL HIGH (ref 0.0–4.4)
Cholesterol, Total: 198 mg/dL (ref 100–199)
HDL: 27 mg/dL — ABNORMAL LOW (ref >39)
LDL Chol Calc (NIH): 108 mg/dL — ABNORMAL HIGH (ref 0–99)
Triglycerides: 363 mg/dL — ABNORMAL HIGH (ref 0–149)
VLDL Cholesterol Cal: 63 mg/dL — ABNORMAL HIGH (ref 5–40)

## 2020-11-28 ENCOUNTER — Other Ambulatory Visit: Payer: Self-pay | Admitting: Cardiovascular Disease

## 2020-11-29 NOTE — Telephone Encounter (Signed)
Rx(s) sent to pharmacy electronically.  

## 2020-11-30 ENCOUNTER — Encounter: Payer: Self-pay | Admitting: Internal Medicine

## 2020-11-30 ENCOUNTER — Ambulatory Visit: Payer: Medicare Other | Admitting: Internal Medicine

## 2020-11-30 ENCOUNTER — Other Ambulatory Visit: Payer: Self-pay

## 2020-11-30 ENCOUNTER — Ambulatory Visit (INDEPENDENT_AMBULATORY_CARE_PROVIDER_SITE_OTHER): Payer: Medicare Other

## 2020-11-30 VITALS — BP 104/60 | HR 68 | Ht 62.0 in | Wt 99.0 lb

## 2020-11-30 DIAGNOSIS — I5032 Chronic diastolic (congestive) heart failure: Secondary | ICD-10-CM

## 2020-11-30 DIAGNOSIS — E782 Mixed hyperlipidemia: Secondary | ICD-10-CM

## 2020-11-30 DIAGNOSIS — Z7901 Long term (current) use of anticoagulants: Secondary | ICD-10-CM | POA: Diagnosis not present

## 2020-11-30 DIAGNOSIS — E781 Pure hyperglyceridemia: Secondary | ICD-10-CM

## 2020-11-30 DIAGNOSIS — Z952 Presence of prosthetic heart valve: Secondary | ICD-10-CM

## 2020-11-30 LAB — POCT INR: INR: 1.2 — AB (ref 2.0–3.0)

## 2020-11-30 NOTE — Progress Notes (Signed)
LIPID CLINIC CONSULT NOTE  Chief Complaint:  Follow-up dyslipidemia  Primary Care Physician: Leanna Battles, MD  Primary Cardiologist:  Quay Burow, MD  HPI:  [name] is a 80 y.o. female who is being seen today for the evaluation of dyslipidemia at the request of Leanna Battles, MD. [name] is a pleasant female who was seen last year via audio/video conferencing for a telehealth.  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  At the time she had no significant coronary artery disease.  She does have a history of congestive heart failure, chronic kidney disease, biventricular AICD placement followed by Dr. Loletha Grayer and family history of heart disease.  She reports a longstanding history of high triglycerides which was present in multiple family members.  She has been on pravastatin 40 mg.  More recently she was referred for evaluation and management of high triglycerides which do seem to be much higher than they had been in the past.  Her most recent lipid profile showed total cholesterol 252, triglycerides 602, HDL 29 and LDL 116.  She is nearly underweight at 99 pounds with a BMI of 18.74 and reports a very restricted diet.  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  11/30/2020  [name] returns today for follow-up.  She continues to do well.  Her weight is minimal but stable at 99 pounds.  She has responded well to Vascepa with marked reduction in triglycerides from 602 down to 363, total cholesterol now 198 and LDL 108.  She reports tolerating Vascepa well.  Currently is covered under a health well grant until February 2023.  Hopefully the health well foundation will be funded next year.  PMHx:  Past Medical History:  Diagnosis Date    Allergy    seasonal   Anemia    Anxiety    Arthritis    Asthma    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 Protecta   Biventricular ICD (implantable cardioverter-defibrillator) in place    with CRT   Blood transfusion without reported diagnosis    Bursitis    Cataract    RIGHT EYE   CHF (congestive heart failure) ([place])    Chronic kidney disease    Colon polyp    adenomatous   Complication of anesthesia    patient stated that had difficulty getting the breathing tube removed, patient said that she stopped breathing and HR dropped to 10  patient then woke up and started breathing pateint stated no longer than one minute; re-intubated in PACU following cholecystectomy 10/28/13   COPD (chronic obstructive pulmonary disease) ([place])    Depression    Diverticulosis    Dysrhythmia    Fibromyalgia    GERD (gastroesophageal reflux disease)    Gout    H/O mitral valve replacement 2002, 2007   Heart murmur    Hemorrhoids    Hyperlipidemia    Hypertension    Hypothyroidism    IBS (irritable bowel syndrome)    MVP (mitral valve prolapse)    Neuromuscular disorder ([place])    fibromyalgia   Pacemaker    Peptic ulcer disease     Past Surgical History:  Procedure Laterality Date   ABDOMINAL HYSTERECTOMY     BIV ICD GENERATOR CHANGEOUT N/A 04/29/2017   Procedure: BIV  ICD GENERATOR CHANGEOUT;  Surgeon: Sanda Klein, MD;  Location: [place] [facility];  Service: Cardiovascular;  Laterality: N/A;   CARDIAC CATHETERIZATION  02/25/2006   normal left main, normal LAD, normal L Cfx, normal/dominant RCA (Dr. [name])   [place]  2007, 11/202012   x2 (pacemaker) (Dr. Jerilynn Mages. [place])   [place]  2002   MV repair - Dr. Berle Mull   CHOLECYSTECTOMY N/A 10/28/2013   Procedure: LAPAROSCOPIC CHOLECYSTECTOMY WITH INTRAOPERATIVE CHOLANGIOGRAM;  Surgeon: Edward Jolly, MD;  Location: [place];  Service: General;  Laterality: N/A;    COLONOSCOPY     EYE SURGERY     IMPLANTABLE CARDIOVERTER DEFIBRILLATOR (ICD) GENERATOR CHANGE N/A 04/10/2011   Procedure: ICD GENERATOR CHANGE;  Surgeon: Sanda Klein, MD;  Location: [place] [facility];  Service: Cardiovascular;  Laterality: N/A;   INSERT / REPLACE / REMOVE PACEMAKER     KNEE ARTHROSCOPY     MITRAL VALVE REPLACEMENT  02/26/2006   re-do MVR w/47m St. [name] (Dr. BEllison Hughs   [place] 2005   persantine myoview - low ris, EF 63%   RIGHT HEART CATH  04/03/2006   pulm cap wedge pressure 24/24, PA pressure 43/22 (mean 381mg), CO 4.8, CI 4.1 (Dr. J.Jackie Plum  TOTAL SHOULDER ARTHROPLASTY Right 12/27/2015   Procedure: RIGHT TOTAL SHOULDER ARTHROPLASTY;  Surgeon: JuTania AdeMD;  [place] [facility] Service: Orthopedics;  Laterality: Right;  Right total shoulder arthroplasty   TOTAL SHOULDER ARTHROPLASTY Left 10/27/2019   Procedure: REVERSE TOTAL SHOULDER ARTHROPLASTY;  Surgeon: ChTania AdeMD;  Location: [facility];  Service: Orthopedics;  Laterality: Left;   TRANSTHORACIC ECHOCARDIOGRAM  12/2011   EF 50-55%, mild global hypokinesis; LA severely dilated; calcification of anterior/posterior MV leaflets, bi-leaflets St. [name] mechanical MV; mild TR; trace AV regurg/pulm valve regurg    FAMHx:  Family History  Problem Relation Age of Onset   Breast cancer Sister        multiple   Uterine cancer Sister        multiple   Heart disease Mother    Heart attack Mother    Heart disease Father    Kidney disease Father    Heart attack Father    Heart disease Sister    Crohn's disease Sister    Heart disease Son    Colon cancer Neg Hx    Stomach cancer Neg Hx     SOCHx:   reports that she quit smoking about 19 years ago. Her smoking use included cigarettes. She has never used smokeless tobacco. She reports current alcohol use. She reports that she does not use drugs.  ALLERGIES:  Allergies  Allergen Reactions   Ace Inhibitors Cough   Lipitor [Atorvastatin  Calcium] Other (See Comments)    Knee pain.   Codeine Nausea And Vomiting    ROS: Pertinent items noted in HPI and remainder of comprehensive ROS otherwise negative.  HOME MEDS: Current Outpatient Medications on File Prior to Visit  Medication Sig Dispense Refill   allopurinol (ZYLOPRIM) 300 MG tablet Take 300 mg by mouth every evening.      ALPRAZolam (XANAX) 0.25 MG tablet Take 0.25 mg by mouth at bedtime as needed for anxiety or sleep.      benzonatate (TESSALON) 100 MG capsule Take 100 mg by mouth 3 (three) times daily as needed.     buPROPion (WELLBUTRIN) 100 MG tablet Take 100 mg by mouth daily.  12   doxycycline (VIBRA-TABS)  100 MG tablet Take 100 mg by mouth 2 (two) times daily.     esomeprazole (NEXIUM) 20 MG capsule Take 20 mg by mouth daily at 12 noon.     icosapent Ethyl (VASCEPA) 1 g capsule Take 2 capsules (2 g total) by mouth 2 (two) times daily. 120 capsule 11   metoprolol succinate (TOPROL-XL) 25 MG 24 hr tablet TAKE 1 AND 1/2 TABLETS (37.5 MG TOTAL) BY MOUTH DAILY. 135 tablet 3   pravastatin (PRAVACHOL) 40 MG tablet Take 40 mg at bedtime by mouth.      PROAIR HFA 108 (90 BASE) MCG/ACT inhaler Inhale 1-2 puffs into the lungs every 6 (six) hours as needed for wheezing.      Sennosides (SENNA) 15 MG TABS Take 15 mg by mouth at bedtime.      spironolactone (ALDACTONE) 25 MG tablet TAKE 1 TABLET BY MOUTH EVERY DAY 90 tablet 2   SYNTHROID 50 MCG tablet Take 50 mcg by mouth daily.  2   warfarin (COUMADIN) 2.5 MG tablet TAKE 1 TO 1 & 1/2 TABLETS BY MOUTH DAILY AS DIRECTED BY COUMADIN CLINIC 135 tablet 1   amoxicillin (AMOXIL) 500 MG tablet TAKE 4 TABS BY MOUTH ONCE FOR 1 DOSE. ANTIBIOTIC SHOULD BE TAKEN 60MINUTES PRIOR TO DENTAL PROCEDURE (Patient not taking: No sig reported)     enoxaparin (LOVENOX) 40 MG/0.4ML injection Inject 0.4 mLs (40 mg total) into the skin daily. (Patient not taking: No sig reported) 4 mL 0   furosemide (LASIX) 40 MG tablet TAKE 1 TABLET BY MOUTH DAILY.  **NEEDS OV FOR FUTURE REFILLS** (Patient not taking: Reported on 11/30/2020) 90 tablet 2   Current Facility-Administered Medications on File Prior to Visit  Medication Dose Route Frequency Provider Last Rate Last Admin   0.9 %  sodium chloride infusion  500 mL Intravenous Continuous Irene Shipper, MD        LABS/IMAGING: No results found for this or any previous visit (from the past 48 hour(s)). No results found.  LIPID PANEL:    Component Value Date/Time   CHOL 198 11/23/2020 1007   TRIG 363 (H) 11/23/2020 1007   HDL 27 (L) 11/23/2020 1007   CHOLHDL 7.3 (H) 11/23/2020 1007   CHOLHDL 8.2 12/09/2006 0410   VLDL 45 (H) 12/09/2006 0410   LDLCALC 108 (H) 11/23/2020 1007    WEIGHTS: Wt Readings from Last 3 Encounters:  11/30/20 99 lb (44.9 kg)  08/30/20 99 lb 3.2 oz (45 kg)  07/31/20 99 lb 6.4 oz (45.1 kg)    VITALS: BP 104/60 (BP Location: Left Arm, Patient Position: Sitting, Cuff Size: Small)   Pulse 68   Ht 5' 2"  (1.575 m)   Wt 99 lb (44.9 kg)   SpO2 98%   BMI 18.11 kg/m   EXAM: Deferred  EKG: Deferred  ASSESSMENT: Familial hypertriglyceridemia History of congestive heart failure status post Bi-V AICD  PLAN: 1.   [name] continues to do well on Vascepa.  She has had close to 50% reduction in triglycerides.  Diet already is very low in saturated fats as she is nearly underweight.  I would recommend she continues on her current therapy including the statin.  We will plan repeat lipids in 1 year and follow-up at that time.  Pixie Casino, MD, [facility], [place] Director of the Advanced Lipid Disorders &  Cardiovascular Risk Reduction Clinic Diplomate of the American Board of Clinical Lipidology Attending Cardiologist  Direct Dial: [phone]  Fax: [phone]  Website:  [email or web address] 11/30/2020, 4:23 PM

## 2020-11-30 NOTE — Patient Instructions (Signed)
Take 3 tablets today and 2 tablets Saturday only and then continue with warfarin 2.5 mg daily except 5 mg each Monday and Friday.  Repeat INR in 2 weeks

## 2020-11-30 NOTE — Patient Instructions (Signed)
Medication Instructions:  Your physician recommends that you continue on your current medications as directed. Please refer to the Current Medication list given to you today.  *If you need a refill on your cardiac medications before your next appointment, please call your pharmacy*   Lab Work: FASTING lab work in 1 year to check cholesterol   If you have labs (blood work) drawn today and your tests are completely normal, you will receive your results only by: [place] (if you have MyChart) OR A paper copy in the mail If you have any lab test that is abnormal or we need to change your treatment, we will call you to review the results.   Follow-Up: At [facility], you and your health needs are our priority.  As part of our continuing mission to provide you with exceptional heart care, we have created designated Provider Care Teams.  These Care Teams include your primary Cardiologist (physician) and Advanced Practice Providers (APPs -  Physician Assistants and Nurse Practitioners) who all work together to provide you with the care you need, when you need it.  We recommend signing up for the patient portal called "MyChart".  Sign up information is provided on this After Visit Summary.  MyChart is used to connect with patients for Virtual Visits (Telemedicine).  Patients are able to view lab/test results, encounter notes, upcoming appointments, etc.  Non-urgent messages can be sent to your provider as well.   To learn more about what you can do with MyChart, go to [email or web address].    Your next appointment:   12 month(s) - lipid clinic  The format for your next appointment:   In Person  Provider:   K. Mali Hilty, MD   Other Instructions

## 2020-12-10 NOTE — Progress Notes (Signed)
Remote ICD transmission.   

## 2020-12-14 ENCOUNTER — Ambulatory Visit (INDEPENDENT_AMBULATORY_CARE_PROVIDER_SITE_OTHER): Payer: Medicare Other

## 2020-12-14 ENCOUNTER — Ambulatory Visit (INDEPENDENT_AMBULATORY_CARE_PROVIDER_SITE_OTHER): Payer: Medicare Other | Admitting: Cardiovascular Disease

## 2020-12-14 ENCOUNTER — Encounter: Payer: Self-pay | Admitting: Cardiovascular Disease

## 2020-12-14 ENCOUNTER — Other Ambulatory Visit: Payer: Self-pay

## 2020-12-14 VITALS — BP 100/56 | HR 72 | Ht 62.0 in | Wt 99.5 lb

## 2020-12-14 DIAGNOSIS — Z95810 Presence of automatic (implantable) cardiac defibrillator: Secondary | ICD-10-CM

## 2020-12-14 DIAGNOSIS — Z952 Presence of prosthetic heart valve: Secondary | ICD-10-CM

## 2020-12-14 DIAGNOSIS — Z7901 Long term (current) use of anticoagulants: Secondary | ICD-10-CM

## 2020-12-14 DIAGNOSIS — T82110S Breakdown (mechanical) of cardiac electrode, sequela: Secondary | ICD-10-CM

## 2020-12-14 DIAGNOSIS — N1832 Chronic kidney disease, stage 3b: Secondary | ICD-10-CM

## 2020-12-14 DIAGNOSIS — I480 Paroxysmal atrial fibrillation: Secondary | ICD-10-CM

## 2020-12-14 DIAGNOSIS — I5032 Chronic diastolic (congestive) heart failure: Secondary | ICD-10-CM

## 2020-12-14 DIAGNOSIS — E782 Mixed hyperlipidemia: Secondary | ICD-10-CM

## 2020-12-14 LAB — PACEMAKER DEVICE OBSERVATION

## 2020-12-14 LAB — POCT INR: INR: 1.8 — AB (ref 2.0–3.0)

## 2020-12-14 NOTE — Patient Instructions (Signed)

## 2020-12-14 NOTE — Patient Instructions (Signed)
Take 3 tablets today only and then increase to warfarin 2.5 mg daily except 5 mg each Monday, Wednesday and Friday.  Repeat INR in 2 weeks

## 2020-12-14 NOTE — Progress Notes (Signed)
Cardiology office note    Date:  12/14/2020   ID:  [name], DOB [birth year], MRN [id number]  PCP:  Kathleen Battles, MD  Cardiologist: Kathleen Burow, MD  Electrophysiologist:  Kathleen Klein, MD   Evaluation Performed:  Follow-Up Visit  Chief Complaint:  ICD follow up  History of Present Illness:    [name] is a 80 y.o. female with  mechanical mitral valve replacement (repair 2002, replacement 2007,  biventricular pacemaker/defibrillator  (Medtronic [place], generator changeout in November 2018) and chronic heart failure.    She is generally doing well.  Her problems with nausea and vomiting are less frequent, although they still occur once or twice a week.  She denies dyspnea at rest or with activity (which she is quite sedentary).  She has not had leg edema, orthopnea or PND and denies angina pectoris.  She is compliant with anticoagulation and has not had any bleeding problems.  Her INR today was subtherapeutic at 1.8.  She denies any neurological complaints.  Recently her biventricular pacemaker has shown multiple episodes of artifactual atrial mode switch due to far field R wave oversensing and even T wave oversensing on the atrial channel.  Atrial sensitivity was set relatively low at 0.45 mV.  She does have a history of atrial arrhythmia, but all of the recent episodes appear to be artifactual.  P waves sensed today at 2.8 mV.  Increased atrial sensitivity threshold to 0.9 mV.  Also made a small adjustments to post A-paced A-blanking interval, increased to 210 ms.  Otherwise device function appears to be normal.  Estimated generator longevity is 4.4 years.  All other lead parameters are excellent.  The reported burden of atrial fibrillation of 3.5% it is exaggerated by the oversensing.  Patient activity remains very low at only 0.1 hours a day.  Heart rate histograms appear appropriate.  The lower rate limit is set at 70 bpm and under the circumstances she paces  the atrium about 75% of the time.  Underlying rhythm today was sinus bradycardia 50-55 bpm with PACs.  She has 94.5% biventricular pacing (effective 92.8%, slight decrease from before).  Just over the last week or so her OptiVol has been out of range, but this is not accompanied by any clinical symptoms.  Device interrogated in the office today.  Her Medtronic [place] device was implanted in 2018 as a generator change and still has about 6 years of estimated battery longevity.  She has 97% biventricular pacing and also has 24% atrial pacing.  Her heart rate histograms are little blunted but she is also quite sedentary.  The device has detected 8 episodes of paroxysmal atrial tachycardia all of them occurring on June 7.  Their cumulative duration was only 26 minutes.  In early 2020 she began developing" noise" on the right ventricular pace-sense channel.  She has recovered left ventricular systolic function to an ejection fraction of 50-55% and she has never received tachycardia therapies.  Ventricular arrhythmia detection and therapies are turned off. Echo performed in March 2022 shows stable left ventricular ejection fraction of 45-50%.  The mean mitral valve gradient is 4 mmHg at a heart rate of 86 bpm which is also stable.   Unfortunately, [name] has lost both her sons to substance abuse at the age of 40.  She is raising her 76 year old grandson, since both of his parents died by overdose.  She has a great relationship with her other grandchild a 32 year old granddaughter who is living independently.  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 Left ventricular ejection fraction which has been as low as 10% improved substantially after implantation of the CRT-D device and now her LVEF is near normal. She received her initial CRT-D in 2008 and underwent a  generator change out in 2012 and 2019. She has not received VT therapy. She has a Riata 7 [place] ICD lead under advisory, which appeared fluoroscopically normal at the time of last generator change.  Did not have any angiographic CAD prior to her surgery.     The atrial lead is a Medtronic [id number] serial number [id number] implanted in July 23, 2006 [name]) The right ventricular lead is a Chemical engineer 7002 serial number [id number] implanted same date The left ventricular lead is a Guidant BS CI 4543 LV lead, serial [id number] implanted same date She had redundant Medtronic left ventricular epicardial pacing leads implanted at the time of surgery in 2007.   Past Medical History:  Diagnosis Date   Allergy    seasonal   Anemia    Anxiety    Arthritis    Asthma    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 Protecta   Biventricular ICD (implantable cardioverter-defibrillator) in place    with CRT   Blood transfusion without reported diagnosis    Bursitis    Cataract    RIGHT EYE   CHF (congestive heart failure) ([place])    Chronic kidney disease    Colon polyp    adenomatous   Complication of anesthesia    patient stated that had difficulty getting the breathing tube removed, patient said that she stopped breathing and HR dropped to 10  patient then woke up and started breathing pateint stated no longer than one minute; re-intubated in PACU following cholecystectomy 10/28/13   COPD (chronic obstructive pulmonary disease) ([place])    Depression    Diverticulosis    Dysrhythmia    Fibromyalgia    GERD (gastroesophageal reflux disease)    Gout    H/O mitral valve replacement 2002, 2007   Heart murmur    Hemorrhoids    Hyperlipidemia    Hypertension    Hypothyroidism    IBS (irritable bowel syndrome)    MVP (mitral valve prolapse)    Neuromuscular disorder ([place])    fibromyalgia   Pacemaker    Peptic ulcer disease    Past Surgical History:  Procedure  Laterality Date   ABDOMINAL HYSTERECTOMY     BIV ICD GENERATOR CHANGEOUT N/A 04/29/2017   Procedure: BIV ICD GENERATOR CHANGEOUT;  Surgeon: Kathleen Klein, MD;  Location: [facility];  Service: Cardiovascular;  Laterality: N/A;   CARDIAC CATHETERIZATION  02/25/2006   normal left main, normal LAD, normal L Cfx, normal/dominant RCA (Dr. [name])   [place]  2007, 11/202012   x2 (pacemaker) (Dr. Jerilynn Mages. Hermann Dottavio)   [place]  2002   MV repair - Dr. Berle Mull   CHOLECYSTECTOMY N/A 10/28/2013   Procedure: LAPAROSCOPIC CHOLECYSTECTOMY WITH INTRAOPERATIVE CHOLANGIOGRAM;  Surgeon: Edward Jolly, MD;  Location: [facility];  Service: General;  Laterality: N/A;   COLONOSCOPY     EYE SURGERY     IMPLANTABLE CARDIOVERTER DEFIBRILLATOR (ICD) GENERATOR CHANGE N/A 04/10/2011   Procedure: ICD GENERATOR CHANGE;  Surgeon: Kathleen Klein, MD;  Location: [facility];  Service: Cardiovascular;  Laterality: N/A;  INSERT / REPLACE / REMOVE PACEMAKER     KNEE ARTHROSCOPY     MITRAL VALVE REPLACEMENT  02/26/2006   re-do MVR w/22m St. [name] (Dr. [place]llison Hughs   [place]   persantine myoview - low ris, EF 63%   RIGHT HEART CATH  04/03/2006   pulm cap wedge pressure 24/24, PA pressure 43/22 (mean 361mg), CO 4.8, CI 4.1 (Dr. J.Jackie Plum  TOTAL SHOULDER ARTHROPLASTY Right 12/27/2015   Procedure: RIGHT TOTAL SHOULDER ARTHROPLASTY;  Surgeon: JuTania AdeMD;  [facility] Service: Orthopedics;  Laterality: Right;  Right total shoulder arthroplasty   TOTAL SHOULDER ARTHROPLASTY Left 10/27/2019   Procedure: REVERSE TOTAL SHOULDER ARTHROPLASTY;  Surgeon: ChTania AdeMD;  Location: [facility];  Service: Orthopedics;  Laterality: Left;   TRANSTHORACIC ECHOCARDIOGRAM  12/2011   EF 50-55%, mild global hypokinesis; LA severely dilated; calcification of anterior/posterior MV leaflets, bi-leaflets St. [name] mechanical MV; mild TR; trace AV regurg/pulm valve  regurg     Current Meds  Medication Sig   allopurinol (ZYLOPRIM) 300 MG tablet Take 300 mg by mouth every evening.    ALPRAZolam (XANAX) 0.25 MG tablet Take 0.25 mg by mouth at bedtime as needed for anxiety or sleep.    amoxicillin (AMOXIL) 500 MG tablet TAKE 4 TABS BY MOUTH ONCE FOR 1 DOSE. ANTIBIOTIC SHOULD BE TAKEN 60MINUTES PRIOR TO DENTAL PROCEDURE   benzonatate (TESSALON) 100 MG capsule Take 100 mg by mouth 3 (three) times daily as needed.   buPROPion (WELLBUTRIN) 100 MG tablet Take 100 mg by mouth daily.   enoxaparin (LOVENOX) 40 MG/0.4ML injection Inject 0.4 mLs (40 mg total) into the skin daily.   esomeprazole (NEXIUM) 20 MG capsule Take 20 mg by mouth daily at 12 noon.   furosemide (LASIX) 40 MG tablet TAKE 1 TABLET BY MOUTH DAILY. **NEEDS OV FOR FUTURE REFILLS**   icosapent Ethyl (VASCEPA) 1 g capsule Take 2 capsules (2 g total) by mouth 2 (two) times daily.   metoprolol succinate (TOPROL-XL) 25 MG 24 hr tablet TAKE 1 AND 1/2 TABLETS (37.5 MG TOTAL) BY MOUTH DAILY.   pravastatin (PRAVACHOL) 40 MG tablet Take 40 mg at bedtime by mouth.    PROAIR HFA 108 (90 BASE) MCG/ACT inhaler Inhale 1-2 puffs into the lungs every 6 (six) hours as needed for wheezing.    Sennosides (SENNA) 15 MG TABS Take 15 mg by mouth at bedtime.    spironolactone (ALDACTONE) 25 MG tablet TAKE 1 TABLET BY MOUTH EVERY DAY   SYNTHROID 50 MCG tablet Take 50 mcg by mouth daily.   warfarin (COUMADIN) 2.5 MG tablet TAKE 1 TO 1 & 1/2 TABLETS BY MOUTH DAILY AS DIRECTED BY COUMADIN CLINIC   Current Facility-Administered Medications for the 12/14/20 encounter (Office Visit) with Alexza Norbeck, MiDani GobbleMD  Medication   0.9 %  sodium chloride infusion     Allergies:   Ace inhibitors, Lipitor [atorvastatin calcium], and Codeine   Social History   Tobacco Use   Smoking status: Former    Types: Cigarettes    Quit date: 06/02/2001    Years since quitting: 19.5   Smokeless tobacco: Never  Vaping Use   Vaping Use: Never used   Substance Use Topics   Alcohol use: Yes    Alcohol/week: 0.0 standard drinks    Comment: social-1 every 6 months   Drug use: No     Family Hx: The patient's family history includes Breast cancer in her sister; Crohn's disease in  her sister; Heart attack in her father and mother; Heart disease in her father, mother, sister, and son; Kidney disease in her father; Uterine cancer in her sister. There is no history of Colon cancer or Stomach cancer.  ROS:   Please see the history of present illness.    All other systems are reviewed and are negative.  Prior CV studies:   The following studies were reviewed today:  08/16/2020 ECHO   1. The mitral valve has been repaired/replaced. PHT < 130 ms but with TVI  2.8; cannot exclude pathological regurgitation; trivial by color Doppler.  The mean mitral valve gradient is 4.0 mmHg with average heart rate of 86  bpm. There is a unknown size St.  [name] mechanical valve present in the mitral position. Procedure Date:  2007.   2. Left ventricular ejection fraction, by estimation, is 45 to 50%. The  left ventricle has mildly decreased function. The left ventricle  demonstrates global hypokinesis. There is mild concentric left ventricular  hypertrophy. Left ventricular diastolic  parameters are indeterminate.   3. Right ventricular systolic function is normal. The right ventricular  size is normal. There is normal pulmonary artery systolic pressure. The  estimated right ventricular systolic pressure is 50.9 mmHg.   4. The aortic valve is tricuspid. There is mild calcification of the  aortic valve. Aortic valve regurgitation is mild to moderate.   Comparison(s): A prior study was performed on 08/15/2019. Prior images  reviewed side by side. Similar mechanical mitral valve parameters and  aortic regurgitation without further left ventricular dilation or  dysfunction.   MV Area (PHT): 3.60 cm       MV Area VTI:   0.71 cm      MV Peak grad:  7.0  mmHg      MV Mean grad:  4.0 mmHg      MV Vmax:       1.32 m/s    MV Decel Time: 211 msec       08/15/2019 echo   1. Difficult acoustic windows LVEF appears mildly depressed with mild lateral hypokinesis . Left ventricular ejection fraction, by estimation, is 45 to 50%. The left ventricle has moderate to severely decreased function. The left ventricle demonstrates  regional wall motion abnormalities (see scoring diagram/findings for description). Left ventricular diastolic parameters are consistent with Grade II diastolic dysfunction (pseudonormalization).   2. Right ventricular systolic function is normal. The right ventricular size is normal. There is mildly elevated pulmonary artery systolic pressure.   3. Left atrial size was mildly dilated.   4. Mechanical valve present Peak and mean gradients through the valve are 5 and 3 mm Hg respectively This is similar to previous echo. There is trivial MR. .   5. The aortic valve is abnormal. Aortic valve regurgitation is mild. Mild aortic valve sclerosis is present, with no evidence of aortic valve stenosis.   Labs/Other Tests and Data Reviewed:    EKG: Intracardiac electrogram shows atrial paced, biventricular paced rhythm.  Surface ECG from 07/31/2020 was personally reviewed, it shows atrial paced, biventricular paced rhythm with distinct positive R wave in lead V1. Recent Labs: 07/31/2020: ALT 21   Recent Lipid Panel Lab Results  Component Value Date/Time   CHOL 198 11/23/2020 10:07 AM   TRIG 363 (H) 11/23/2020 10:07 AM   HDL 27 (L) 11/23/2020 10:07 AM   CHOLHDL 7.3 (H) 11/23/2020 10:07 AM   CHOLHDL 8.2 12/09/2006 04:10 AM   LDLCALC 108 (H) 11/23/2020 10:07 AM  11/23/2020 Total cholesterol 198, HDL 27, LDL 108, triglycerides 363  Wt Readings from Last 3 Encounters:  12/14/20 99 lb 8 oz (45.1 kg)  11/30/20 99 lb (44.9 kg)  08/30/20 99 lb 3.2 oz (45 kg)     Objective:    Vital Signs:  BP (!) 100/56 (BP Location: Left Arm)    Pulse 72   Ht 5' 2"  (1.575 m)   Wt 99 lb 8 oz (45.1 kg)   SpO2 91%   BMI 18.20 kg/m    . General: Alert, oriented x3, no distress, very lean, borderline well-nourished Head: no evidence of trauma, PERRL, EOMI, no exophtalmos or lid lag, no myxedema, no xanthelasma; normal ears, nose and oropharynx Neck: normal jugular venous pulsations and no hepatojugular reflux; brisk carotid pulses without delay and no carotid bruits Chest: clear to auscultation, no signs of consolidation by percussion or palpation, normal fremitus, symmetrical and full respiratory excursions Cardiovascular: normal position and quality of the apical impulse, regular rhythm, crisp mechanical valve clicks, 1/6 aortic ejection murmur early peaking, no diastolic murmurs, rubs or gallops.  Healthy left subclavian pacemaker site Abdomen: no tenderness or distention, no masses by palpation, no abnormal pulsatility or arterial bruits, normal bowel sounds, no hepatosplenomegaly Extremities: no clubbing, cyanosis or edema; 2+ radial, ulnar and brachial pulses bilaterally; 2+ right femoral, posterior tibial and dorsalis pedis pulses; 2+ left femoral, posterior tibial and dorsalis pedis pulses; no subclavian or femoral bruits Neurological: grossly nonfocal Psych: Normal mood and affect    ASSESSMENT & PLAN:    CHF: Clinically euvolemic without symptoms of heart failure, but very sedentary.  OptiVol is out of range recently but has been rather unreliable ever since she had shoulder surgery.  LVEF reportedly stable at 45-50%. MVR: Unchanged prosthetic valve function.  Integrating the serial echocardiograms and her physical exam, I do not think there is any evidence for meaningful mitral valve regurgitation. Anticoagulation: INR subtherapeutic today and warfarin dose adjusted. CRT-D: Problems with oversensing on her right ventricular lead and recovery of LV function have led to discontinuation of tachycardia therapies.  She has fair  biventricular resynchronization pacing efficiency.  Now she is having some issues with atrial channel oversensing primarily due to far field R wave oversensing.  Atrial sensing increased to 0.9 mV.  Postatrial paced A-blanking prolonged AFib: No convincing true episodes of atrial fibrillation, just lots of artifactual events recently.  She is anticoagulated anyway.  No episodes of high ventricular rate. HLP: LDL cholesterol is fair on statin, but she has very low HDL and high triglycerides despite being underweight. CKD 3: Creatinine around 1.4, at baseline or better than previous baseline.  GFR around 35. Patient Instructions  Medication Instructions:  No changes *If you need a refill on your cardiac medications before your next appointment, please call your pharmacy*   Lab Work: None ordered If you have labs (blood work) drawn today and your tests are completely normal, you will receive your results only by: [place] (if you have MyChart) OR A paper copy in the mail If you have any lab test that is abnormal or we need to change your treatment, we will call you to review the results.   Testing/Procedures: None ordered   Follow-Up: At [facility], you and your health needs are our priority.  As part of our continuing mission to provide you with exceptional heart care, we have created designated Provider Care Teams.  These Care Teams include your primary Cardiologist (physician) and Advanced Practice Providers (APPs -  Physician Assistants and Nurse Practitioners) who all work together to provide you with the care you need, when you need it.  We recommend signing up for the patient portal called "MyChart".  Sign up information is provided on this After Visit Summary.  MyChart is used to connect with patients for Virtual Visits (Telemedicine).  Patients are able to view lab/test results, encounter notes, upcoming appointments, etc.  Non-urgent messages can be sent to your provider as  well.   To learn more about what you can do with MyChart, go to [email or web address].    Your next appointment:   12 month(s)  The format for your next appointment:   In Person  Provider:   Sanda Klein, MD    Signed, Kathleen Klein, MD  12/14/2020 9:12 AM    [place]

## 2020-12-28 ENCOUNTER — Other Ambulatory Visit: Payer: Self-pay

## 2020-12-28 ENCOUNTER — Ambulatory Visit (INDEPENDENT_AMBULATORY_CARE_PROVIDER_SITE_OTHER): Payer: Medicare Other

## 2020-12-28 DIAGNOSIS — Z7901 Long term (current) use of anticoagulants: Secondary | ICD-10-CM

## 2020-12-28 DIAGNOSIS — Z952 Presence of prosthetic heart valve: Secondary | ICD-10-CM

## 2020-12-28 LAB — POCT INR: INR: 2 (ref 2.0–3.0)

## 2020-12-28 NOTE — Patient Instructions (Signed)
Take 2 tablets today only and then increase warfarin 2 tablets daily except 1 tablet each Monday, Wednesday and Friday.  Repeat INR in 2 weeks

## 2021-01-11 ENCOUNTER — Other Ambulatory Visit: Payer: Self-pay

## 2021-01-11 ENCOUNTER — Ambulatory Visit (INDEPENDENT_AMBULATORY_CARE_PROVIDER_SITE_OTHER): Payer: Medicare Other

## 2021-01-11 DIAGNOSIS — Z7901 Long term (current) use of anticoagulants: Secondary | ICD-10-CM | POA: Diagnosis not present

## 2021-01-11 DIAGNOSIS — Z952 Presence of prosthetic heart valve: Secondary | ICD-10-CM

## 2021-01-11 LAB — POCT INR: INR: 2 (ref 2.0–3.0)

## 2021-01-11 NOTE — Patient Instructions (Signed)
Take 2 tablets today only and then increase warfarin 2 tablets daily except 1 tablet each Monday and Friday.  Repeat INR in 2 weeks

## 2021-01-20 ENCOUNTER — Other Ambulatory Visit: Payer: Self-pay | Admitting: Cardiovascular Disease

## 2021-01-25 ENCOUNTER — Ambulatory Visit (INDEPENDENT_AMBULATORY_CARE_PROVIDER_SITE_OTHER): Payer: Medicare Other

## 2021-01-25 ENCOUNTER — Other Ambulatory Visit: Payer: Self-pay

## 2021-01-25 DIAGNOSIS — Z7901 Long term (current) use of anticoagulants: Secondary | ICD-10-CM

## 2021-01-25 DIAGNOSIS — Z5181 Encounter for therapeutic drug level monitoring: Secondary | ICD-10-CM | POA: Diagnosis not present

## 2021-01-25 LAB — POCT INR: INR: 3.1 — AB (ref 2.0–3.0)

## 2021-01-25 NOTE — Patient Instructions (Signed)
Description   Continue Warfarin 2 tablets daily except 1 tablet each Monday and Friday.  Repeat INR in 3 weeks

## 2021-01-28 ENCOUNTER — Other Ambulatory Visit: Payer: Self-pay | Admitting: Cardiovascular Disease

## 2021-01-28 ENCOUNTER — Telehealth: Payer: Self-pay | Admitting: Cardiovascular Disease

## 2021-01-28 MED ORDER — AMOXICILLIN 500 MG PO TABS: 2000.0000 mg | ORAL_TABLET | ORAL | 3 refills | Status: DC

## 2021-01-28 NOTE — Telephone Encounter (Signed)
Pt aware amoxicillin 500 mg 4 tabs 1 hour before dental procedure refill sent to pharmacy as requested ./cy

## 2021-01-28 NOTE — Telephone Encounter (Signed)
pt states that she was told that she is unable to take medication prior to her dental appt and has quetions in regards to this... please advise

## 2021-02-15 ENCOUNTER — Other Ambulatory Visit: Payer: Self-pay

## 2021-02-15 ENCOUNTER — Ambulatory Visit: Payer: Medicare Other

## 2021-02-15 DIAGNOSIS — Z952 Presence of prosthetic heart valve: Secondary | ICD-10-CM

## 2021-02-15 DIAGNOSIS — Z7901 Long term (current) use of anticoagulants: Secondary | ICD-10-CM

## 2021-02-15 LAB — POCT INR: INR: 3.1 — AB (ref 2.0–3.0)

## 2021-02-15 NOTE — Patient Instructions (Signed)
Continue Warfarin 2 tablets daily except 1 tablet each Monday and Friday.  Repeat INR in 6 weeks

## 2021-02-19 ENCOUNTER — Ambulatory Visit (INDEPENDENT_AMBULATORY_CARE_PROVIDER_SITE_OTHER): Payer: Medicare Other

## 2021-02-19 DIAGNOSIS — I480 Paroxysmal atrial fibrillation: Secondary | ICD-10-CM | POA: Diagnosis not present

## 2021-02-19 LAB — CUP PACEART REMOTE DEVICE CHECK
Battery Remaining Longevity: 51 mo
Battery Voltage: 2.98 V
Brady Statistic AP VP Percent: 87.58 %
Brady Statistic AP VS Percent: 2.2 %
Brady Statistic AS VP Percent: 9.48 %
Brady Statistic AS VS Percent: 0.74 %
Brady Statistic RA Percent Paced: 88.76 %
Brady Statistic RV Percent Paced: 32.83 %
Date Time Interrogation Session: 20220920033523
HighPow Impedance: 79 Ohm
Implantable Lead Implant Date: 20080221
Implantable Lead Implant Date: 20080221
Implantable Lead Implant Date: 20080221
Implantable Lead Location: 753858
Implantable Lead Location: 753859
Implantable Lead Location: 753860
Implantable Lead Model: 4543
Implantable Lead Model: 5076
Implantable Lead Model: 7002
Implantable Lead Serial Number: 134379
Implantable Pulse Generator Implant Date: 20181128
Lead Channel Impedance Value: 304 Ohm
Lead Channel Impedance Value: 323 Ohm
Lead Channel Impedance Value: 399 Ohm
Lead Channel Impedance Value: 437 Ohm
Lead Channel Impedance Value: 437 Ohm
Lead Channel Impedance Value: 703 Ohm
Lead Channel Pacing Threshold Amplitude: 0.625 V
Lead Channel Pacing Threshold Amplitude: 1.125 V
Lead Channel Pacing Threshold Amplitude: 1.125 V
Lead Channel Pacing Threshold Pulse Width: 0.4 ms
Lead Channel Pacing Threshold Pulse Width: 0.4 ms
Lead Channel Pacing Threshold Pulse Width: 0.4 ms
Lead Channel Sensing Intrinsic Amplitude: 2.25 mV
Lead Channel Sensing Intrinsic Amplitude: 2.25 mV
Lead Channel Sensing Intrinsic Amplitude: 31.625 mV
Lead Channel Sensing Intrinsic Amplitude: 31.625 mV
Lead Channel Setting Pacing Amplitude: 1.5 V
Lead Channel Setting Pacing Amplitude: 1.75 V
Lead Channel Setting Pacing Amplitude: 2.25 V
Lead Channel Setting Pacing Pulse Width: 0.4 ms
Lead Channel Setting Pacing Pulse Width: 0.4 ms
Lead Channel Setting Sensing Sensitivity: 1.2 mV

## 2021-02-26 NOTE — Progress Notes (Signed)
Remote ICD transmission.   

## 2021-03-01 ENCOUNTER — Emergency Department (HOSPITAL_COMMUNITY): Payer: Medicare Other

## 2021-03-01 ENCOUNTER — Other Ambulatory Visit: Payer: Self-pay

## 2021-03-01 ENCOUNTER — Inpatient Hospital Stay (HOSPITAL_COMMUNITY)
Admission: EM | Admit: 2021-03-01 | Discharge: 2021-03-05 | DRG: 378 | Disposition: A | Discharge status: Home / Self Care | Payer: Medicare Other | Attending: Family Medicine | Admitting: Family Medicine

## 2021-03-01 ENCOUNTER — Encounter (HOSPITAL_COMMUNITY): Payer: Self-pay | Admitting: Internal Medicine

## 2021-03-01 DIAGNOSIS — K5090 Crohn's disease, unspecified, without complications: Secondary | ICD-10-CM | POA: Diagnosis present

## 2021-03-01 DIAGNOSIS — D649 Anemia, unspecified: Secondary | ICD-10-CM

## 2021-03-01 DIAGNOSIS — E785 Hyperlipidemia, unspecified: Secondary | ICD-10-CM | POA: Diagnosis present

## 2021-03-01 DIAGNOSIS — D62 Acute posthemorrhagic anemia: Secondary | ICD-10-CM | POA: Diagnosis present

## 2021-03-01 DIAGNOSIS — D6832 Hemorrhagic disorder due to extrinsic circulating anticoagulants: Secondary | ICD-10-CM | POA: Diagnosis present

## 2021-03-01 DIAGNOSIS — Z8049 Family history of malignant neoplasm of other genital organs: Secondary | ICD-10-CM

## 2021-03-01 DIAGNOSIS — K921 Melena: Secondary | ICD-10-CM | POA: Diagnosis present

## 2021-03-01 DIAGNOSIS — I130 Hypertensive heart and chronic kidney disease with heart failure and stage 1 through stage 4 chronic kidney disease, or unspecified chronic kidney disease: Secondary | ICD-10-CM | POA: Diagnosis present

## 2021-03-01 DIAGNOSIS — K922 Gastrointestinal hemorrhage, unspecified: Secondary | ICD-10-CM

## 2021-03-01 DIAGNOSIS — T45515A Adverse effect of anticoagulants, initial encounter: Secondary | ICD-10-CM | POA: Diagnosis present

## 2021-03-01 DIAGNOSIS — Z79899 Other long term (current) drug therapy: Secondary | ICD-10-CM | POA: Diagnosis not present

## 2021-03-01 DIAGNOSIS — Z95810 Presence of automatic (implantable) cardiac defibrillator: Secondary | ICD-10-CM | POA: Diagnosis not present

## 2021-03-01 DIAGNOSIS — Z9049 Acquired absence of other specified parts of digestive tract: Secondary | ICD-10-CM

## 2021-03-01 DIAGNOSIS — R791 Abnormal coagulation profile: Secondary | ICD-10-CM

## 2021-03-01 DIAGNOSIS — Z96612 Presence of left artificial shoulder joint: Secondary | ICD-10-CM | POA: Diagnosis present

## 2021-03-01 DIAGNOSIS — M797 Fibromyalgia: Secondary | ICD-10-CM | POA: Diagnosis present

## 2021-03-01 DIAGNOSIS — Z87891 Personal history of nicotine dependence: Secondary | ICD-10-CM

## 2021-03-01 DIAGNOSIS — F419 Anxiety disorder, unspecified: Secondary | ICD-10-CM | POA: Diagnosis present

## 2021-03-01 DIAGNOSIS — K625 Hemorrhage of anus and rectum: Secondary | ICD-10-CM

## 2021-03-01 DIAGNOSIS — Z803 Family history of malignant neoplasm of breast: Secondary | ICD-10-CM | POA: Diagnosis not present

## 2021-03-01 DIAGNOSIS — K219 Gastro-esophageal reflux disease without esophagitis: Secondary | ICD-10-CM | POA: Diagnosis present

## 2021-03-01 DIAGNOSIS — Z20822 Contact with and (suspected) exposure to covid-19: Secondary | ICD-10-CM | POA: Diagnosis present

## 2021-03-01 DIAGNOSIS — F32A Depression, unspecified: Secondary | ICD-10-CM | POA: Diagnosis present

## 2021-03-01 DIAGNOSIS — J449 Chronic obstructive pulmonary disease, unspecified: Secondary | ICD-10-CM | POA: Diagnosis present

## 2021-03-01 DIAGNOSIS — D689 Coagulation defect, unspecified: Secondary | ICD-10-CM | POA: Diagnosis not present

## 2021-03-01 DIAGNOSIS — Z79890 Hormone replacement therapy (postmenopausal): Secondary | ICD-10-CM | POA: Diagnosis not present

## 2021-03-01 DIAGNOSIS — Z888 Allergy status to other drugs, medicaments and biological substances status: Secondary | ICD-10-CM

## 2021-03-01 DIAGNOSIS — R1011 Right upper quadrant pain: Secondary | ICD-10-CM

## 2021-03-01 DIAGNOSIS — I5032 Chronic diastolic (congestive) heart failure: Secondary | ICD-10-CM | POA: Diagnosis present

## 2021-03-01 DIAGNOSIS — N183 Chronic kidney disease, stage 3 unspecified: Secondary | ICD-10-CM | POA: Diagnosis present

## 2021-03-01 DIAGNOSIS — E039 Hypothyroidism, unspecified: Secondary | ICD-10-CM | POA: Diagnosis present

## 2021-03-01 DIAGNOSIS — Z96611 Presence of right artificial shoulder joint: Secondary | ICD-10-CM | POA: Diagnosis present

## 2021-03-01 DIAGNOSIS — Z952 Presence of prosthetic heart valve: Secondary | ICD-10-CM | POA: Diagnosis not present

## 2021-03-01 DIAGNOSIS — K31811 Angiodysplasia of stomach and duodenum with bleeding: Secondary | ICD-10-CM | POA: Diagnosis present

## 2021-03-01 DIAGNOSIS — Z7901 Long term (current) use of anticoagulants: Secondary | ICD-10-CM

## 2021-03-01 DIAGNOSIS — M1990 Unspecified osteoarthritis, unspecified site: Secondary | ICD-10-CM | POA: Diagnosis present

## 2021-03-01 DIAGNOSIS — Z8249 Family history of ischemic heart disease and other diseases of the circulatory system: Secondary | ICD-10-CM

## 2021-03-01 DIAGNOSIS — Z885 Allergy status to narcotic agent status: Secondary | ICD-10-CM

## 2021-03-01 DIAGNOSIS — Z841 Family history of disorders of kidney and ureter: Secondary | ICD-10-CM

## 2021-03-01 DIAGNOSIS — I10 Essential (primary) hypertension: Secondary | ICD-10-CM | POA: Diagnosis present

## 2021-03-01 LAB — CBC WITH DIFFERENTIAL/PLATELET
Abs Immature Granulocytes: 0.22 10*3/uL — ABNORMAL HIGH (ref 0.00–0.07)
Abs Immature Granulocytes: 0.11 10*3/uL — ABNORMAL HIGH (ref 0.00–0.07)
Basophils Absolute: 0 10*3/uL (ref 0.0–0.1)
Basophils Absolute: 0 10*3/uL (ref 0.0–0.1)
Basophils Relative: 0 %
Basophils Relative: 0 %
Eosinophils Absolute: 0.1 10*3/uL (ref 0.0–0.5)
Eosinophils Absolute: 0.1 10*3/uL (ref 0.0–0.5)
Eosinophils Relative: 1 %
Eosinophils Relative: 1 %
HCT: 15.8 % — ABNORMAL LOW (ref 36.0–46.0)
HCT: 18.9 % — ABNORMAL LOW (ref 36.0–46.0)
Hemoglobin: 5 g/dL — CL (ref 12.0–15.0)
Hemoglobin: 6.2 g/dL — CL (ref 12.0–15.0)
Immature Granulocytes: 1 %
Immature Granulocytes: 1 %
Lymphocytes Relative: 4 %
Lymphocytes Relative: 9 %
Lymphs Abs: 0.7 10*3/uL (ref 0.7–4.0)
Lymphs Abs: 0.8 10*3/uL (ref 0.7–4.0)
MCH: 32.7 pg (ref 26.0–34.0)
MCH: 31.8 pg (ref 26.0–34.0)
MCHC: 31.6 g/dL (ref 30.0–36.0)
MCHC: 32.8 g/dL (ref 30.0–36.0)
MCV: 103.3 fL — ABNORMAL HIGH (ref 80.0–100.0)
MCV: 96.9 fL (ref 80.0–100.0)
Monocytes Absolute: 0.7 10*3/uL (ref 0.1–1.0)
Monocytes Absolute: 0.6 10*3/uL (ref 0.1–1.0)
Monocytes Relative: 4 %
Monocytes Relative: 7 %
Neutro Abs: 14.7 10*3/uL — ABNORMAL HIGH (ref 1.7–7.7)
Neutro Abs: 7.3 10*3/uL (ref 1.7–7.7)
Neutrophils Relative %: 90 %
Neutrophils Relative %: 82 %
Platelets: 200 10*3/uL (ref 150–400)
Platelets: 144 10*3/uL — ABNORMAL LOW (ref 150–400)
RBC: 1.53 MIL/uL — ABNORMAL LOW (ref 3.87–5.11)
RBC: 1.95 MIL/uL — ABNORMAL LOW (ref 3.87–5.11)
RDW: 15.8 % — ABNORMAL HIGH (ref 11.5–15.5)
RDW: 15.7 % — ABNORMAL HIGH (ref 11.5–15.5)
WBC: 16.5 10*3/uL — ABNORMAL HIGH (ref 4.0–10.5)
WBC: 9 10*3/uL (ref 4.0–10.5)
nRBC: 0.1 % (ref 0.0–0.2)
nRBC: 0.2 % (ref 0.0–0.2)

## 2021-03-01 LAB — COMPREHENSIVE METABOLIC PANEL
ALT: 15 U/L (ref 0–44)
AST: 24 U/L (ref 15–41)
Albumin: 2.5 g/dL — ABNORMAL LOW (ref 3.5–5.0)
Alkaline Phosphatase: 59 U/L (ref 38–126)
Anion gap: 6 (ref 5–15)
BUN: 36 mg/dL — ABNORMAL HIGH (ref 8–23)
CO2: 24 mmol/L (ref 22–32)
Calcium: 8.5 mg/dL — ABNORMAL LOW (ref 8.9–10.3)
Chloride: 107 mmol/L (ref 98–111)
Creatinine, Ser: 1.39 mg/dL — ABNORMAL HIGH (ref 0.44–1.00)
GFR, Estimated: 39 mL/min — ABNORMAL LOW (ref >60)
Glucose, Bld: 114 mg/dL — ABNORMAL HIGH (ref 70–99)
Potassium: 4.9 mmol/L (ref 3.5–5.1)
Sodium: 137 mmol/L (ref 135–145)
Total Bilirubin: 0.7 mg/dL (ref 0.3–1.2)
Total Protein: 5.1 g/dL — ABNORMAL LOW (ref 6.5–8.1)

## 2021-03-01 LAB — PROTIME-INR
INR: 8 (ref 0.8–1.2)
INR: 2.2 — ABNORMAL HIGH (ref 0.8–1.2)
Prothrombin Time: 67 seconds — ABNORMAL HIGH (ref 11.4–15.2)
Prothrombin Time: 24.8 seconds — ABNORMAL HIGH (ref 11.4–15.2)

## 2021-03-01 LAB — POC OCCULT BLOOD, ED: Fecal Occult Bld: POSITIVE — AB

## 2021-03-01 LAB — PREPARE RBC (CROSSMATCH)

## 2021-03-01 LAB — LIPASE, BLOOD: Lipase: 37 U/L (ref 11–51)

## 2021-03-01 LAB — SARS CORONAVIRUS 2 (TAT 6-24 HRS): SARS Coronavirus 2: NEGATIVE

## 2021-03-01 MED ORDER — VITAMIN K1 10 MG/ML IJ SOLN
1.0000 mg | Freq: Once | INTRAVENOUS | Status: AC
  Administered 2021-03-01: 1 mg via INTRAVENOUS
  Filled 2021-03-01 (×2): qty 0.1

## 2021-03-01 MED ORDER — SODIUM CHLORIDE 0.9 % IV SOLN
10.0000 mL/h | Freq: Once | INTRAVENOUS | Status: AC
  Administered 2021-03-01: 10 mL/h via INTRAVENOUS

## 2021-03-01 MED ORDER — PANTOPRAZOLE SODIUM 40 MG IV SOLR
40.0000 mg | Freq: Two times a day (BID) | INTRAVENOUS | Status: DC
  Administered 2021-03-01 – 2021-03-05 (×8): 40 mg via INTRAVENOUS
  Filled 2021-03-01 (×8): qty 40

## 2021-03-01 MED ORDER — IOHEXOL 300 MG/ML  SOLN
75.0000 mL | Freq: Once | INTRAMUSCULAR | Status: AC | PRN
  Administered 2021-03-01: 75 mL via INTRAVENOUS

## 2021-03-01 MED ORDER — SODIUM CHLORIDE 0.9 % IV SOLN: INTRAVENOUS | Status: AC

## 2021-03-01 NOTE — ED Provider Notes (Signed)
Care was taken over from Dr. Sherry Ruffing awaiting CT scan.  CT scan did not show any significant acute findings.  I spoke with Dr. Hal Hope with the hospitalist service who will admit the patient for further treatment.   Malvin Johns, MD 03/01/21 [phone]

## 2021-03-01 NOTE — H&P (View-Only) (Signed)
Referring Provider:  Dr. Sherry Ruffing, [place] Primary Care Physician:  Leanna Battles, MD Primary Gastroenterologist:  Dr. Henrene Pastor  Reason for Consultation:  GI bleed  HPI: [name] is a 80 y.o. 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  Presenting with complaints of blood in her stool.  Says that she woke up at 1:30AM with what she thought was diarrhea.  Realized that it was clotted blood.  Had some episodes of vomiting as well but no blood in that.  Has some right sided abdominal pain but that is chronic--nothing new with that.  Denies NSAID use.  Says that she had a normal BM yesterday.  Hgb found to be 5.0 grams and INR 8.  Is getting transfused with PRBCs and receiving some Vitamin K.  Has elevated WBC count of 16.5K.  CT of the abdomen and pelvis has been ordered as well to evaluate her abdominal pain.  Colonoscopy February 2018 with diverticulosis only.  EGD February 2018 showed benign-appearing esophageal stenosis only that was dilated with [place].   Past Medical History:  Diagnosis Date   Allergy    seasonal   Anemia    Anxiety    Arthritis    Asthma    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 Protecta   Biventricular ICD (implantable cardioverter-defibrillator) in place    with CRT   Blood transfusion without reported diagnosis    Bursitis    Cataract    RIGHT EYE   CHF (congestive heart failure) ([place])    Chronic kidney disease    Colon polyp    adenomatous   Complication of anesthesia    patient stated that had difficulty getting the breathing tube removed, patient said that she stopped breathing and HR dropped to 10  patient then woke up and started breathing pateint stated no longer than  one minute; re-intubated in PACU following cholecystectomy 10/28/13   COPD (chronic obstructive pulmonary disease) ([place])    Depression    Diverticulosis    Dysrhythmia    Fibromyalgia    GERD (gastroesophageal reflux disease)    Gout    H/O mitral valve replacement 2002, 2007   Heart murmur    Hemorrhoids    Hyperlipidemia    Hypertension    Hypothyroidism    IBS (irritable bowel syndrome)    MVP (mitral valve prolapse)    Neuromuscular disorder ([place])    fibromyalgia   Pacemaker    Peptic ulcer disease     Past Surgical History:  Procedure Laterality Date   ABDOMINAL HYSTERECTOMY     BIV ICD GENERATOR CHANGEOUT N/A 04/29/2017   Procedure: BIV ICD GENERATOR CHANGEOUT;  Surgeon: Sanda Klein, MD;  Location: [facility];  Service: Cardiovascular;  Laterality: N/A;   CARDIAC CATHETERIZATION  02/25/2006   normal left main, normal LAD, normal L Cfx, normal/dominant RCA (Dr. [name])   [place]  2007, 11/202012   x2 (pacemaker) (Dr. Jerilynn Mages. Croitoru)   [place]  2002   MV repair - Dr. Berle Mull   CHOLECYSTECTOMY N/A 10/28/2013   Procedure: LAPAROSCOPIC CHOLECYSTECTOMY WITH INTRAOPERATIVE CHOLANGIOGRAM;  Surgeon: Edward Jolly, MD;  Location: [facility];  Service: General;  Laterality: N/A;   COLONOSCOPY     EYE SURGERY     IMPLANTABLE CARDIOVERTER DEFIBRILLATOR (ICD) GENERATOR CHANGE N/A  04/10/2011   Procedure: ICD GENERATOR CHANGE;  Surgeon: Sanda Klein, MD;  Location: [facility] CATH [facility];  Service: Cardiovascular;  Laterality: N/A;   INSERT / REPLACE / REMOVE PACEMAKER     KNEE ARTHROSCOPY     MITRAL VALVE REPLACEMENT  02/26/2006   re-do MVR w/66m St. [name] (Dr. BEllison Hughs   NKennedy 2005   persantine myoview - low ris, EF 63%   RIGHT HEART CATH  04/03/2006   pulm cap wedge pressure 24/24, PA pressure 43/22 (mean 379mg), CO 4.8, CI 4.1 (Dr. J.Jackie Plum  TOTAL SHOULDER ARTHROPLASTY Right 12/27/2015   Procedure: RIGHT  TOTAL SHOULDER ARTHROPLASTY;  Surgeon: JuTania AdeMD;  [facility] Service: Orthopedics;  Laterality: Right;  Right total shoulder arthroplasty   TOTAL SHOULDER ARTHROPLASTY Left 10/27/2019   Procedure: REVERSE TOTAL SHOULDER ARTHROPLASTY;  Surgeon: ChTania AdeMD;  Location: [facility];  Service: Orthopedics;  Laterality: Left;   TRANSTHORACIC ECHOCARDIOGRAM  12/2011   EF 50-55%, mild global hypokinesis; LA severely dilated; calcification of anterior/posterior MV leaflets, bi-leaflets St. [name] mechanical MV; mild TR; trace AV regurg/pulm valve regurg    Prior to Admission medications   Medication Sig Start Date End Date Taking? Authorizing Provider  allopurinol (ZYLOPRIM) 300 MG tablet Take 300 mg by mouth every evening.     [provider]  ALPRAZolam (XDuanne Moron0.25 MG tablet Take 0.25 mg by mouth at bedtime as needed for anxiety or sleep.     [provider]  amoxicillin (AMOXIL) 500 MG tablet Take 4 tablets (2,000 mg total) by mouth as directed. 01/28/21   BeLorretta HarpMD  benzonatate (TESSALON) 100 MG capsule Take 100 mg by mouth 3 (three) times daily as needed. 11/06/20   [provider]  buPROPion (WELLBUTRIN) 100 MG tablet Take 100 mg by mouth daily. 09/12/16   [provider]  enoxaparin (LOVENOX) 40 MG/0.4ML injection Inject 0.4 mLs (40 mg total) into the skin daily. 10/21/19   Croitoru, Mihai, MD  esomeprazole (NEXIUM) 20 MG capsule Take 20 mg by mouth daily at 12 noon.    [provider]  furosemide (LASIX) 40 MG tablet TAKE 1 TABLET BY MOUTH DAILY. **NEEDS OV FOR FUTURE REFILLS** 06/26/20   BeLorretta HarpMD  icosapent Ethyl (VASCEPA) 1 g capsule Take 2 capsules (2 g total) by mouth 2 (two) times daily. 08/30/20   Hilty, KeNadean CorwinMD  metoprolol succinate (TOPROL-XL) 25 MG 24 hr tablet TAKE 1 AND 1/2 TABLETS (37.5 MG TOTAL) BY MOUTH DAILY. 09/27/20   Croitoru, Mihai, MD  pravastatin (PRAVACHOL) 40 MG tablet Take 40 mg at  bedtime by mouth.     [provider]  PROAIR HFA 108 (90 BASE) MCG/ACT inhaler Inhale 1-2 puffs into the lungs every 6 (six) hours as needed for wheezing.  01/23/15   [provider]  Sennosides (SENNA) 15 MG TABS Take 15 mg by mouth at bedtime.     [provider]  spironolactone (ALDACTONE) 25 MG tablet TAKE 1 TABLET BY MOUTH EVERY DAY 11/29/20   Hilty, KeNadean CorwinMD  SYNTHROID 50 MCG tablet Take 50 mcg by mouth daily. 06/05/16   [provider]  warfarin (COUMADIN) 2.5 MG tablet TAKE 1 TO 1 & 1/2 TABLETS BY MOUTH DAILY AS DIRECTED BY COUMADIN CLINIC 01/21/21   Croitoru, MiDani GobbleMD    Current Facility-Administered Medications  Medication Dose Route Frequency Provider Last Rate Last Admin   0.9 %  sodium  chloride infusion  500 mL Intravenous Continuous Irene Shipper, MD       pantoprazole (PROTONIX) injection 40 mg  40 mg Intravenous Q12H Meris Reede D, PA-C       phytonadione (VITAMIN K) 1 mg in dextrose 5 % 50 mL IVPB  1 mg Intravenous Once Tegeler, Gwenyth Allegra, MD 50 mL/hr at 03/01/21 1614 1 mg at 03/01/21 1614   Current Outpatient Medications  Medication Sig Dispense Refill   allopurinol (ZYLOPRIM) 300 MG tablet Take 300 mg by mouth every evening.      ALPRAZolam (XANAX) 0.25 MG tablet Take 0.25 mg by mouth at bedtime as needed for anxiety or sleep.      amoxicillin (AMOXIL) 500 MG tablet Take 4 tablets (2,000 mg total) by mouth as directed. 4 tablet 3   benzonatate (TESSALON) 100 MG capsule Take 100 mg by mouth 3 (three) times daily as needed.     buPROPion (WELLBUTRIN) 100 MG tablet Take 100 mg by mouth daily.  12   enoxaparin (LOVENOX) 40 MG/0.4ML injection Inject 0.4 mLs (40 mg total) into the skin daily. 4 mL 0   esomeprazole (NEXIUM) 20 MG capsule Take 20 mg by mouth daily at 12 noon.     furosemide (LASIX) 40 MG tablet TAKE 1 TABLET BY MOUTH DAILY. **NEEDS OV FOR FUTURE REFILLS** 90 tablet 2   icosapent Ethyl (VASCEPA) 1 g capsule Take 2 capsules  (2 g total) by mouth 2 (two) times daily. 120 capsule 11   metoprolol succinate (TOPROL-XL) 25 MG 24 hr tablet TAKE 1 AND 1/2 TABLETS (37.5 MG TOTAL) BY MOUTH DAILY. 135 tablet 3   pravastatin (PRAVACHOL) 40 MG tablet Take 40 mg at bedtime by mouth.      PROAIR HFA 108 (90 BASE) MCG/ACT inhaler Inhale 1-2 puffs into the lungs every 6 (six) hours as needed for wheezing.      Sennosides (SENNA) 15 MG TABS Take 15 mg by mouth at bedtime.      spironolactone (ALDACTONE) 25 MG tablet TAKE 1 TABLET BY MOUTH EVERY DAY 90 tablet 2   SYNTHROID 50 MCG tablet Take 50 mcg by mouth daily.  2   warfarin (COUMADIN) 2.5 MG tablet TAKE 1 TO 1 & 1/2 TABLETS BY MOUTH DAILY AS DIRECTED BY COUMADIN CLINIC 135 tablet 0    Allergies as of 03/01/2021 - Review Complete 03/01/2021  Allergen Reaction Noted   Ace inhibitors Cough 08/01/2008   Lipitor [atorvastatin calcium] Other (See Comments) 04/07/2011   Codeine Nausea And Vomiting 01/19/2014    Family History  Problem Relation Age of Onset   Breast cancer Sister        multiple   Uterine cancer Sister        multiple   Heart disease Mother    Heart attack Mother    Heart disease Father    Kidney disease Father    Heart attack Father    Heart disease Sister    Crohn's disease Sister    Heart disease Son    Colon cancer Neg Hx    Stomach cancer Neg Hx     Social History   Socioeconomic History   Marital status: Married    Spouse name: [name]   Number of children: 4   Years of education: 12   Highest education level: Not on file  Occupational History   Occupation: retired  Tobacco Use   Smoking status: Former    Types: Cigarettes    Quit date: 06/02/2001  Years since quitting: 19.7   Smokeless tobacco: Never  Vaping Use   Vaping Use: Never used  Substance and Sexual Activity   Alcohol use: Yes    Alcohol/week: 0.0 standard drinks    Comment: social-1 every 6 months   Drug use: No   Sexual activity: Not on file  Other Topics Concern    Not on file  Social History Narrative   Drinks very little caffeine    Social Determinants of Health   Financial Resource Strain: Not on file  Food Insecurity: Not on file  Transportation Needs: Not on file  Physical Activity: Not on file  Stress: Not on file  Social Connections: Not on file  Intimate Partner Violence: Not on file    Review of Systems: ROS is O/W negative except as mentioned in HPI.  Physical Exam: Vital signs in last 24 hours: Temp:  [98.4 F (36.9 C)-98.5 F (36.9 C)] 98.4 F (36.9 C) (09/30 1518) Pulse Rate:  [73-96] 80 (09/30 1600) Resp:  [13-24] 19 (09/30 1600) BP: (95-151)/(45-65) 123/45 (09/30 1600) SpO2:  [91 %-100 %] 99 % (09/30 1600)   General:  Alert, Well-developed, well-nourished, pleasant and cooperative in NAD; pale Head:  Normocephalic and atraumatic. Eyes:  Sclera clear, no icterus.  Conjunctiva pale. Ears:  Normal auditory acuity. Mouth:  No deformity or lesions.   Lungs:  Clear throughout to auscultation.  No wheezes, crackles, or rhonchi.  Heart:  Irregular.  Valve click noted. Abdomen:  Soft, non-distended.  BS present.  No significant tenderness on exam, Rectal:  Deferred.  Heme positive per EDP.  Msk:  Symmetrical without gross deformities. Pulses:  Normal pulses noted. Extremities:  Without clubbing or edema. Neurologic:  Alert and oriented x 4;  grossly normal neurologically. Skin:  Intact without significant lesions or rashes. Psych:  Alert and cooperative. Normal mood and affect.  Lab Results: Recent Labs    03/01/21 1140  WBC 16.5*  HGB 5.0*  HCT 15.8*  PLT 200   BMET Recent Labs    03/01/21 1140  NA 137  K 4.9  CL 107  CO2 24  GLUCOSE 114*  BUN 36*  CREATININE 1.39*  CALCIUM 8.5*   LFT Recent Labs    03/01/21 1140  PROT 5.1*  ALBUMIN 2.5*  AST 24  ALT 15  ALKPHOS 59  BILITOT 0.7   PT/INR Recent Labs    03/01/21 1140  LABPROT 67.0*  INR 8.0*   IMPRESSION:  *GI bleed in the setting of  supratherapeutic INR:  Reports clots in stool, not black per se.  Anything could bleed with an INR of 8.  ? Diverticular vs ulcer, etc. *ABLA:  Hgb 5.0 grams as compared to 8.9 grams in 10/2019. *Coumadin coagulopathy with INR of 8.0.  They are giving some Vitamin K. *St. [name] mechanical MV replacement *Leukocytosis of 16.5K:  ? Reactive.  CT will help sort that out.  PLAN: -Transfuse and monitor Hgb. -PPI 40 mg IV BID. -Try to reverse her INR some. -Will tentatively plan for EGD on 10/1 if INR low enough, otherwise will postpone to Sunday, 10/2 -Follow-up results of CT scan that has been ordered.  Laban Emperor. Odalis Jordan  03/01/2021, 4:34 PM

## 2021-03-01 NOTE — ED Provider Notes (Signed)
Emergency Medicine Provider Triage Evaluation Note  [name] , a 80 y.o. female  was evaluated in triage.  Pt complains of blood in stools that began last night. Reports she woke up in the middle of the night to use the bathroom - it was dark in the bathroom but she had nightlights. States she wiped and noticed her stool was dark black in color. She looked in the toilet bowl and noticed dark red blood in the stool. She also reports emesis and vomiting "Small clots of blood." No coffee ground emesis. She is on Coumadin.  Review of Systems  Positive: + abdominal pain, nausea, vomiting, melena?  Negative: - fevers  Physical Exam  BP (!) 95/55 (BP Location: Left Arm)   Pulse 74   Temp 98.5 F (36.9 C) (Oral)   Resp 16   SpO2 100%  Gen:   Awake, no distress   Resp:  Normal effort  MSK:   Moves extremities without difficulty  Other:  Diffuse abdominal TTP. Pale.  Medical Decision Making  Medically screening exam initiated at 11:28 AM.  Appropriate orders placed.  Cam Harnden was informed that the remainder of the evaluation will be completed by another provider, this initial triage assessment does not replace that evaluation, and the importance of remaining in the ED until their evaluation is complete.     Eustaquio Maize, PA-C 03/01/21 1128    Charlesetta Shanks, MD 03/01/21 [phone]

## 2021-03-01 NOTE — ED Triage Notes (Signed)
Pt here d/t blood in stool and vomiting. Pt endorses abdominal pain. Began this am . Pt has had multiple episodes of vomiting. On blood thinner, warfarin.

## 2021-03-01 NOTE — ED Provider Notes (Signed)
[name] Provider Note   CSN: [id number] Arrival date & time: 03/01/21  1055     History Chief Complaint  Patient presents with   Blood In [place] is a 80 y.o. female.  The history is provided by the patient, the spouse and medical records. No language interpreter was used.  Rectal Bleeding Quality:  Black and tarry and maroon Amount:  Copious Duration:  1 day Timing:  Constant Chronicity:  New Context: diarrhea   Similar prior episodes: no   Relieved by:  Nothing Worsened by:  Nothing Ineffective treatments:  None tried Associated symptoms: abdominal pain, light-headedness (chronically) and vomiting   Associated symptoms: no dizziness and no fever   Risk factors: anticoagulant use       Past Medical History:  Diagnosis Date   Allergy    seasonal   Anemia    Anxiety    Arthritis    Asthma    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 Protecta   Biventricular ICD (implantable cardioverter-defibrillator) in place    with CRT   Blood transfusion without reported diagnosis    Bursitis    Cataract    RIGHT EYE   CHF (congestive heart failure) ([place])    Chronic kidney disease    Colon polyp    adenomatous   Complication of anesthesia    patient stated that had difficulty getting the breathing tube removed, patient said that she stopped breathing and HR dropped to 10  patient then woke up and started breathing pateint stated no longer than one minute; re-intubated in PACU following cholecystectomy 10/28/13   COPD (chronic obstructive pulmonary disease) (HCC)    Depression    Diverticulosis    Dysrhythmia    Fibromyalgia    GERD (gastroesophageal reflux disease)    Gout    H/O mitral valve replacement 2002, 2007   Heart murmur    Hemorrhoids    Hyperlipidemia    Hypertension    Hypothyroidism    IBS (irritable bowel syndrome)    MVP (mitral valve prolapse)     Neuromuscular disorder (HCC)    fibromyalgia   Pacemaker    Peptic ulcer disease     Patient Active Problem List   Diagnosis Date Noted   Status post reverse total shoulder replacement, left 10/27/2019   ICD (implantable cardioverter-defibrillator) lead failure 07/09/2018   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   Accelerated junctional rhythm 01/29/2018   Peptic ulcer disease    Pacemaker    Neuromuscular disorder ([place])    MVP (mitral valve prolapse)    IBS (irritable bowel syndrome)    Hypothyroidism    Hemorrhoids    Hypertension    Mixed hyperlipidemia    Heart murmur    H/O mitral valve replacement    Gout    GERD (gastroesophageal reflux disease)    Fibromyalgia    Dysrhythmia    Diverticulosis    Depression    COPD (chronic obstructive pulmonary disease) (HCC)    Complication of anesthesia    Colon polyp    Chronic kidney disease    CHF (congestive heart failure) (HCC)    Cataract    Bursitis    Asthma    Arthritis    Anxiety    Anemia    Allergy    Pacemaker battery depletion 04/28/2017   Biventricular automatic implantable cardioverter defibrillator at end of life 04/28/2017   Malnutrition of moderate degree (  Altamease Oiler: 60% to less than 75% of standard weight) ([place]) 03/30/2017   CKD (chronic kidney disease) stage 3, GFR 30-59 ml/min (HCC) 03/30/2017   Mild protein-calorie malnutrition ([place]) 09/22/2016   Chronic diastolic (congestive) heart failure ([place]) 03/25/2016   Paroxysmal atrial fibrillation ([place]) 03/25/2016   S/P shoulder replacement 12/27/2015   Cholelithiasis with cholecystitis 10/28/2013   Long term (current) use of anticoagulants 08/19/2012   Biventricular ICD (implantable cardioverter-defibrillator) in place 04/10/2011   History of mitral valve replacement with mechanical valve 04/10/2011   CHEST PAIN 01/24/2010   Automatic implantable cardioverter-defibrillator in situ 01/24/2010   RECTAL BLEEDING 08/01/2009   NAUSEA AND VOMITING 08/01/2009    UTI'S, HX OF 08/01/2009   PERSONAL HX COLONIC POLYPS 08/01/2008   HYPERLIPIDEMIA 07/31/2008   ANEMIA, CHRONIC 07/31/2008   INTERNAL HEMORRHOIDS 07/31/2008   GERD 07/31/2008   DIVERTICULOSIS, COLON 07/31/2008   BURSITIS 07/31/2008    Past Surgical History:  Procedure Laterality Date   ABDOMINAL HYSTERECTOMY     BIV ICD GENERATOR CHANGEOUT N/A 04/29/2017   Procedure: BIV ICD GENERATOR CHANGEOUT;  Surgeon: Sanda Klein, MD;  Location: [facility];  Service: Cardiovascular;  Laterality: N/A;   CARDIAC CATHETERIZATION  02/25/2006   normal left main, normal LAD, normal L Cfx, normal/dominant RCA (Dr. [name])   [place]  2007, 11/202012   x2 (pacemaker) (Dr. Jerilynn Mages. Croitoru)   [place]  2002   MV repair - Dr. Berle Mull   CHOLECYSTECTOMY N/A 10/28/2013   Procedure: LAPAROSCOPIC CHOLECYSTECTOMY WITH INTRAOPERATIVE CHOLANGIOGRAM;  Surgeon: Edward Jolly, MD;  Location: [facility];  Service: General;  Laterality: N/A;   COLONOSCOPY     EYE SURGERY     IMPLANTABLE CARDIOVERTER DEFIBRILLATOR (ICD) GENERATOR CHANGE N/A 04/10/2011   Procedure: ICD GENERATOR CHANGE;  Surgeon: Sanda Klein, MD;  Location: [facility];  Service: Cardiovascular;  Laterality: N/A;   INSERT / REPLACE / REMOVE PACEMAKER     KNEE ARTHROSCOPY     MITRAL VALVE REPLACEMENT  02/26/2006   re-do MVR w/61m St. [name] (Dr. BEllison Hughs   [place] 2005   persantine myoview - low ris, EF 63%   RIGHT HEART CATH  04/03/2006   pulm cap wedge pressure 24/24, PA pressure 43/22 (mean 351mg), CO 4.8, CI 4.1 (Dr. J.Jackie Plum  TOTAL SHOULDER ARTHROPLASTY Right 12/27/2015   Procedure: RIGHT TOTAL SHOULDER ARTHROPLASTY;  Surgeon: JuTania AdeMD;  Location: [facility] Service: Orthopedics;  Laterality: Right;  Right total shoulder arthroplasty   TOTAL SHOULDER ARTHROPLASTY Left 10/27/2019   Procedure: REVERSE TOTAL SHOULDER ARTHROPLASTY;  Surgeon: ChTania AdeMD;   Location: [facility];  Service: Orthopedics;  Laterality: Left;   TRANSTHORACIC ECHOCARDIOGRAM  12/2011   EF 50-55%, mild global hypokinesis; LA severely dilated; calcification of anterior/posterior MV leaflets, bi-leaflets St. [name] mechanical MV; mild TR; trace AV regurg/pulm valve regurg     OB History   No obstetric history on file.     Family History  Problem Relation Age of Onset   Breast cancer Sister        multiple   Uterine cancer Sister        multiple   Heart disease Mother    Heart attack Mother    Heart disease Father    Kidney disease Father    Heart attack Father    Heart disease Sister    Crohn's disease Sister    Heart disease Son    Colon  cancer Neg Hx    Stomach cancer Neg Hx     Social History   Tobacco Use   Smoking status: Former    Types: Cigarettes    Quit date: 06/02/2001    Years since quitting: 19.7   Smokeless tobacco: Never  Vaping Use   Vaping Use: Never used  Substance Use Topics   Alcohol use: Yes    Alcohol/week: 0.0 standard drinks    Comment: social-1 every 6 months   Drug use: No    Home Medications Prior to Admission medications   Medication Sig Start Date End Date Taking? Authorizing Provider  allopurinol (ZYLOPRIM) 300 MG tablet Take 300 mg by mouth every evening.     [provider]  ALPRAZolam Duanne Moron) 0.25 MG tablet Take 0.25 mg by mouth at bedtime as needed for anxiety or sleep.     [provider]  amoxicillin (AMOXIL) 500 MG tablet Take 4 tablets (2,000 mg total) by mouth as directed. 01/28/21   Lorretta Harp, MD  benzonatate (TESSALON) 100 MG capsule Take 100 mg by mouth 3 (three) times daily as needed. 11/06/20   [provider]  buPROPion (WELLBUTRIN) 100 MG tablet Take 100 mg by mouth daily. 09/12/16   [provider]  enoxaparin (LOVENOX) 40 MG/0.4ML injection Inject 0.4 mLs (40 mg total) into the skin daily. 10/21/19   Croitoru, Mihai, MD  esomeprazole (NEXIUM) 20 MG capsule Take 20 mg  by mouth daily at 12 noon.    [provider]  furosemide (LASIX) 40 MG tablet TAKE 1 TABLET BY MOUTH DAILY. **NEEDS OV FOR FUTURE REFILLS** 06/26/20   Lorretta Harp, MD  icosapent Ethyl (VASCEPA) 1 g capsule Take 2 capsules (2 g total) by mouth 2 (two) times daily. 08/30/20   Hilty, Nadean Corwin, MD  metoprolol succinate (TOPROL-XL) 25 MG 24 hr tablet TAKE 1 AND 1/2 TABLETS (37.5 MG TOTAL) BY MOUTH DAILY. 09/27/20   Croitoru, Mihai, MD  pravastatin (PRAVACHOL) 40 MG tablet Take 40 mg at bedtime by mouth.     [provider]  PROAIR HFA 108 (90 BASE) MCG/ACT inhaler Inhale 1-2 puffs into the lungs every 6 (six) hours as needed for wheezing.  01/23/15   [provider]  Sennosides (SENNA) 15 MG TABS Take 15 mg by mouth at bedtime.     [provider]  spironolactone (ALDACTONE) 25 MG tablet TAKE 1 TABLET BY MOUTH EVERY DAY 11/29/20   Hilty, Nadean Corwin, MD  SYNTHROID 50 MCG tablet Take 50 mcg by mouth daily. 06/05/16   [provider]  warfarin (COUMADIN) 2.5 MG tablet TAKE 1 TO 1 & 1/2 TABLETS BY MOUTH DAILY AS DIRECTED BY COUMADIN CLINIC 01/21/21   Croitoru, Mihai, MD    Allergies    Ace inhibitors, Lipitor [atorvastatin calcium], and Codeine  Review of Systems   Review of Systems  Constitutional:  Positive for fatigue. Negative for chills, diaphoresis and fever.  HENT:  Negative for congestion.   Respiratory:  Positive for shortness of breath (chronic). Negative for cough, chest tightness and wheezing.   Cardiovascular:  Negative for chest pain, palpitations and leg swelling.  Gastrointestinal:  Positive for abdominal pain, blood in stool, diarrhea, hematochezia, nausea and vomiting. Negative for constipation.  Genitourinary:  Negative for dysuria and flank pain.  Musculoskeletal:  Negative for back pain, neck pain and neck stiffness.  Skin:  Negative for rash and wound.  Neurological:  Positive for light-headedness (chronically). Negative for dizziness,  syncope and headaches.  Psychiatric/Behavioral:  Negative for agitation and confusion.   All other systems reviewed and are negative.  Physical Exam Updated Vital Signs BP (!) 126/51   Pulse 75   Temp 98.5 F (36.9 C) (Oral)   Resp 18   SpO2 91%   Physical Exam Vitals and nursing note reviewed. Exam conducted with a chaperone present.  Constitutional:      General: She is not in acute distress.    Appearance: She is well-developed. She is not ill-appearing, toxic-appearing or diaphoretic.  HENT:     Head: Normocephalic and atraumatic.     Nose: Nose normal. No congestion.     Mouth/Throat:     Mouth: Mucous membranes are dry.     Pharynx: No oropharyngeal exudate or posterior oropharyngeal erythema.  Eyes:     Extraocular Movements: Extraocular movements intact.     Conjunctiva/sclera: Conjunctivae normal.     Pupils: Pupils are equal, round, and reactive to light.  Cardiovascular:     Rate and Rhythm: Normal rate and regular rhythm.     Heart sounds: Murmur heard.  Pulmonary:     Effort: Pulmonary effort is normal. No respiratory distress.     Breath sounds: Normal breath sounds. No wheezing, rhonchi or rales.  Chest:     Chest wall: No tenderness.  Abdominal:     General: Abdomen is flat. There is no distension.     Palpations: Abdomen is soft.     Tenderness: There is abdominal tenderness. There is no guarding or rebound.  Genitourinary:    Rectum: Guaiac result positive.  Musculoskeletal:        General: No tenderness.     Cervical back: Neck supple. No tenderness.  Skin:    General: Skin is warm and dry.     Capillary Refill: Capillary refill takes less than 2 seconds.     Coloration: Skin is pale.     Findings: No erythema.  Neurological:     General: No focal deficit present.     Mental Status: She is alert.  Psychiatric:        Mood and Affect: Mood normal.    ED Results / Procedures / Treatments   Labs (all labs ordered are listed, but only abnormal  results are displayed) Labs Reviewed  COMPREHENSIVE METABOLIC PANEL - Abnormal; Notable for the following components:      Result Value   Glucose, Bld 114 (*)    BUN 36 (*)    Creatinine, Ser 1.39 (*)    Calcium 8.5 (*)    Total Protein 5.1 (*)    Albumin 2.5 (*)    GFR, Estimated 39 (*)    All other components within normal limits  CBC WITH DIFFERENTIAL/PLATELET - Abnormal; Notable for the following components:   WBC 16.5 (*)    RBC 1.53 (*)    Hemoglobin 5.0 (*)    HCT 15.8 (*)    MCV 103.3 (*)    RDW 15.8 (*)    Neutro Abs 14.7 (*)    Abs Immature Granulocytes 0.22 (*)    All other components within normal limits  PROTIME-INR - Abnormal; Notable for the following components:   Prothrombin Time 67.0 (*)    INR 8.0 (*)    All other components within normal limits  POC OCCULT BLOOD, ED - Abnormal; Notable for the following components:   Fecal Occult Bld POSITIVE (*)    All other components within normal limits  LIPASE, BLOOD  I-STAT CHEM 8, ED  TYPE AND SCREEN  PREPARE RBC (CROSSMATCH)    EKG None  Radiology No results found.  Procedures Procedures   CRITICAL CARE Performed by: Gwenyth Allegra Roshan Roback Total critical care time: 35 minutes Critical care time was exclusive of separately billable procedures and treating other patients. Critical care was necessary to treat or prevent imminent or life-threatening deterioration. Critical care was time spent personally by me on the following activities: development of treatment plan with patient and/or surrogate as well as nursing, discussions with consultants, evaluation of patient's response to treatment, examination of patient, obtaining history from patient or surrogate, ordering and performing treatments and interventions, ordering and review of laboratory studies, ordering and review of radiographic studies, pulse oximetry and re-evaluation of patient's condition.   Medications Ordered in ED Medications  phytonadione  (VITAMIN K) 1 mg in dextrose 5 % 50 mL IVPB (has no administration in time range)  0.9 %  sodium chloride infusion (10 mL/hr Intravenous New Bag/Given 03/01/21 1608)    ED Course  I have reviewed the triage vital signs and the nursing notes.  Pertinent labs & imaging results that were available during my care of the patient were reviewed by me and considered in my medical decision making (see chart for details).    MDM Rules/Calculators/A&P                           [name] is a 80 y.o. female with a past medical history significant for mitral valve replacement on Coumadin therapy, diastolic CHF with ICD placement, diverticulosis, internal hemorrhoids, chronic anemia, previous cholecystectomy, CKD, hypertension, hyperlipidemia, fibromyalgia, COPD who presents with concern for melena diarrhea overnight, nonbloody nausea and vomiting, continued right upper quadrant abdominal discomfort and severe fatigue.  According to patient, she always is fatigued and occasionally has abdominal discomfort but overnight woke up with black bloody diarrhea since 1:30 AM multiple times and significant emesis.  She denies blood in her emesis.  She reports he is always fatigued so cannot say that she is more tired than normal but she reports she has no energy whatsoever.  She reports occasionally has abdominal pain but reports that it has been continued today.  It is moderate in discomfort and in her right upper quadrant.  She does not have a gallbladder from previous cholecystectomy.  She otherwise denies trauma, constipation, fevers, chills, congestion, cough, chest pain, shortness of breath.  She is unsure if she looks more pale than normal.  On exam, lungs are clear.  Murmurs appreciated.  Chest is nontender but abdomen is tender in the epigastric area and right upper quadrant.  Bowel sounds appreciated.  Patient is pale in her posterior oropharynx.  She is otherwise alert and oriented and other than  feeling fatigue denies other complaints.  On arrival, patient was reportedly hypotensive with blood pressure below 100.  Clinically I am concerned the patient is having rectal bleeding in the setting of her blood thinner use.  She does report that she been taking some Tylenol for her right upper quadrant abdominal pain and her chronic left shoulder pain.  Due to this discomfort with bleeding, will get CT scan to look for acute diverticulitis versus other cause of discomfort but will also get type and screen and basic labs.  We will get INR given her Coumadin use.  Anticipate reassessment after work-up to determine disposition.  3:20 PM Patient's hemoglobin returned at 5.0.  Given the patient's blood thinner use and  rectal bleeding today with this finding, patient will need transfusion and admission.  Spoke to [facility] whom she has seen in the past and they will come see her during the admission.  Patient's INR returned at 8.0.  As she has a mechanical valve, I spoke with pharmacy who will help order vitamin K to help gently bring it down as opposed to full reversal.  Patient is amenable to getting blood and admission.  She is awaiting the CT scan to look for acute diverticulitis or other acute abnormality that would change management and after this is completed, anticipate admission.  Care transferred to oncoming team while waiting for admission.   Final Clinical Impression(s) / ED Diagnoses Final diagnoses:  Blood in stool  Rectal bleeding  Symptomatic anemia  Right upper quadrant abdominal pain     Clinical Impression: 1. Blood in stool   2. Rectal bleeding   3. Symptomatic anemia   4. Right upper quadrant abdominal pain     Disposition: Admit after CT is completed  This note was prepared with assistance of Dragon voice recognition software. Occasional wrong-word or sound-a-like substitutions may have occurred due to the inherent limitations of voice recognition  software.     Kaysea Raya, Gwenyth Allegra, MD 03/01/21 [phone]

## 2021-03-01 NOTE — ED Notes (Signed)
Dr.Tegeler made aware of pt hgb of 5. Pt remains alert and oriented x 4. CT tech came and brought pt oral contrast with plan to get pt to CT at 1645. MD aware. Will continue to monitor.

## 2021-03-01 NOTE — ED Notes (Signed)
Pt ambulatory to the restroom with assistance.

## 2021-03-01 NOTE — Consult Note (Signed)
Referring Provider:  Dr. Sherry Ruffing, [place] Primary Care Physician:  Leanna Battles, MD Primary Gastroenterologist:  Dr. Henrene Pastor  Reason for Consultation:  GI bleed  HPI: [name] is a 80 y.o. 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  Presenting with complaints of blood in her stool.  Says that she woke up at 1:30AM with what she thought was diarrhea.  Realized that it was clotted blood.  Had some episodes of vomiting as well but no blood in that.  Has some right sided abdominal pain but that is chronic--nothing new with that.  Denies NSAID use.  Says that she had a normal BM yesterday.  Hgb found to be 5.0 grams and INR 8.  Is getting transfused with PRBCs and receiving some Vitamin K.  Has elevated WBC count of 16.5K.  CT of the abdomen and pelvis has been ordered as well to evaluate her abdominal pain.  Colonoscopy February 2018 with diverticulosis only.  EGD February 2018 showed benign-appearing esophageal stenosis only that was dilated with [place].   Past Medical History:  Diagnosis Date   Allergy    seasonal   Anemia    Anxiety    Arthritis    Asthma    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 Protecta   Biventricular ICD (implantable cardioverter-defibrillator) in place    with CRT   Blood transfusion without reported diagnosis    Bursitis    Cataract    RIGHT EYE   CHF (congestive heart failure) ([place])    Chronic kidney disease    Colon polyp    adenomatous   Complication of anesthesia    patient stated that had difficulty getting the breathing tube removed, patient said that she stopped breathing and HR dropped to 10  patient then woke up and started breathing pateint stated no longer than  one minute; re-intubated in PACU following cholecystectomy 10/28/13   COPD (chronic obstructive pulmonary disease) ([place])    Depression    Diverticulosis    Dysrhythmia    Fibromyalgia    GERD (gastroesophageal reflux disease)    Gout    H/O mitral valve replacement 2002, 2007   Heart murmur    Hemorrhoids    Hyperlipidemia    Hypertension    Hypothyroidism    IBS (irritable bowel syndrome)    MVP (mitral valve prolapse)    Neuromuscular disorder ([place])    fibromyalgia   Pacemaker    Peptic ulcer disease     Past Surgical History:  Procedure Laterality Date   ABDOMINAL HYSTERECTOMY     BIV ICD GENERATOR CHANGEOUT N/A 04/29/2017   Procedure: BIV ICD GENERATOR CHANGEOUT;  Surgeon: Sanda Klein, MD;  Location: [place] [facility];  Service: Cardiovascular;  Laterality: N/A;   CARDIAC CATHETERIZATION  02/25/2006   normal left main, normal LAD, normal L Cfx, normal/dominant RCA (Dr. [name])   [place]  2007, 11/202012   x2 (pacemaker) (Dr. Jerilynn Mages. Croitoru)   [place]  2002   MV repair - Dr. Berle Mull   CHOLECYSTECTOMY N/A 10/28/2013   Procedure: LAPAROSCOPIC CHOLECYSTECTOMY WITH INTRAOPERATIVE CHOLANGIOGRAM;  Surgeon: Edward Jolly, MD;  Location: [place];  Service: General;  Laterality: N/A;   COLONOSCOPY     EYE SURGERY     IMPLANTABLE CARDIOVERTER DEFIBRILLATOR (ICD) GENERATOR CHANGE N/A  04/10/2011   Procedure: ICD GENERATOR CHANGE;  Surgeon: Sanda Klein, MD;  Location: [facility] C[facility] LAB;  Service: Cardiovascular;  Laterality: N/A;   INSERT / REPLACE / REMOVE PACEMAKER     KNEE ARTHROSCOPY     MITRAL VALVE REPLACEMENT  02/26/2006   re-do MVR w/61m St. [name] (Dr. [place]llison Hughs   [place]   persantine myoview - low ris, EF 63%   RIGHT HEART CATH  04/03/2006   pulm cap wedge pressure 24/24, PA pressure 43/22 (mean 369mg), CO 4.8, CI 4.1 (Dr. J.Jackie Plum  TOTAL SHOULDER ARTHROPLASTY Right 12/27/2015   Procedure: RIGHT  TOTAL SHOULDER ARTHROPLASTY;  Surgeon: JuTania AdeMD;  [place] [facility] Service: [place]opedics;  Laterality: Right;  Right total shoulder arthroplasty   TOTAL SHOULDER ARTHROPLASTY Left 10/27/2019   Procedure: REVERSE TOTAL SHOULDER ARTHROPLASTY;  Surgeon: ChTania AdeMD;  Location: [facility];  Service: Orthopedics;  Laterality: Left;   TRANSTHORACIC ECHOCARDIOGRAM  12/2011   EF 50-55%, mild global hypokinesis; LA severely dilated; calcification of anterior/posterior MV leaflets, bi-leaflets St. [name] mechanical MV; mild TR; trace AV regurg/pulm valve regurg    Prior to Admission medications   Medication Sig Start Date End Date Taking? Authorizing Provider  allopurinol (ZYLOPRIM) 300 MG tablet Take 300 mg by mouth every evening.     [provider]  ALPRAZolam (XDuanne Moron0.25 MG tablet Take 0.25 mg by mouth at bedtime as needed for anxiety or sleep.     [provider]  amoxicillin (AMOXIL) 500 MG tablet Take 4 tablets (2,000 mg total) by mouth as directed. 01/28/21   BeLorretta HarpMD  benzonatate (TESSALON) 100 MG capsule Take 100 mg by mouth 3 (three) times daily as needed. 11/06/20   [provider]  buPROPion (WELLBUTRIN) 100 MG tablet Take 100 mg by mouth daily. 09/12/16   [provider]  enoxaparin (LOVENOX) 40 MG/0.4ML injection Inject 0.4 mLs (40 mg total) into the skin daily. 10/21/19   Croitoru, Mihai, MD  esomeprazole (NEXIUM) 20 MG capsule Take 20 mg by mouth daily at 12 noon.    [provider]  furosemide (LASIX) 40 MG tablet TAKE 1 TABLET BY MOUTH DAILY. **NEEDS OV FOR FUTURE REFILLS** 06/26/20   BeLorretta HarpMD  icosapent Ethyl (VASCEPA) 1 g capsule Take 2 capsules (2 g total) by mouth 2 (two) times daily. 08/30/20   Hilty, KeNadean CorwinMD  metoprolol succinate (TOPROL-XL) 25 MG 24 hr tablet TAKE 1 AND 1/2 TABLETS (37.5 MG TOTAL) BY MOUTH DAILY. 09/27/20   Croitoru, Mihai, MD  pravastatin (PRAVACHOL) 40 MG tablet Take 40 mg at  bedtime by mouth.     [provider]  PROAIR HFA 108 (90 BASE) MCG/ACT inhaler Inhale 1-2 puffs into the lungs every 6 (six) hours as needed for wheezing.  01/23/15   [provider]  Sennosides (SENNA) 15 MG TABS Take 15 mg by mouth at bedtime.     [provider]  spironolactone (ALDACTONE) 25 MG tablet TAKE 1 TABLET BY MOUTH EVERY DAY 11/29/20   Hilty, KeNadean CorwinMD  SYNTHROID 50 MCG tablet Take 50 mcg by mouth daily. 06/05/16   [provider]  warfarin (COUMADIN) 2.5 MG tablet TAKE 1 TO 1 & 1/2 TABLETS BY MOUTH DAILY AS DIRECTED BY COUMADIN CLINIC 01/21/21   Croitoru, MiDani GobbleMD    Current Facility-Administered Medications  Medication Dose Route Frequency Provider Last Rate Last Admin   0.9 %  sodium  chloride infusion  500 mL Intravenous Continuous Irene Shipper, MD       pantoprazole (PROTONIX) injection 40 mg  40 mg Intravenous Q12H Nyzir Dubois D, PA-C       phytonadione (VITAMIN K) 1 mg in dextrose 5 % 50 mL IVPB  1 mg Intravenous Once Tegeler, Gwenyth Allegra, MD 50 mL/hr at 03/01/21 1614 1 mg at 03/01/21 1614   Current Outpatient Medications  Medication Sig Dispense Refill   allopurinol (ZYLOPRIM) 300 MG tablet Take 300 mg by mouth every evening.      ALPRAZolam (XANAX) 0.25 MG tablet Take 0.25 mg by mouth at bedtime as needed for anxiety or sleep.      amoxicillin (AMOXIL) 500 MG tablet Take 4 tablets (2,000 mg total) by mouth as directed. 4 tablet 3   benzonatate (TESSALON) 100 MG capsule Take 100 mg by mouth 3 (three) times daily as needed.     buPROPion (WELLBUTRIN) 100 MG tablet Take 100 mg by mouth daily.  12   enoxaparin (LOVENOX) 40 MG/0.4ML injection Inject 0.4 mLs (40 mg total) into the skin daily. 4 mL 0   esomeprazole (NEXIUM) 20 MG capsule Take 20 mg by mouth daily at 12 noon.     furosemide (LASIX) 40 MG tablet TAKE 1 TABLET BY MOUTH DAILY. **NEEDS OV FOR FUTURE REFILLS** 90 tablet 2   icosapent Ethyl (VASCEPA) 1 g capsule Take 2 capsules  (2 g total) by mouth 2 (two) times daily. 120 capsule 11   metoprolol succinate (TOPROL-XL) 25 MG 24 hr tablet TAKE 1 AND 1/2 TABLETS (37.5 MG TOTAL) BY MOUTH DAILY. 135 tablet 3   pravastatin (PRAVACHOL) 40 MG tablet Take 40 mg at bedtime by mouth.      PROAIR HFA 108 (90 BASE) MCG/ACT inhaler Inhale 1-2 puffs into the lungs every 6 (six) hours as needed for wheezing.      Sennosides (SENNA) 15 MG TABS Take 15 mg by mouth at bedtime.      spironolactone (ALDACTONE) 25 MG tablet TAKE 1 TABLET BY MOUTH EVERY DAY 90 tablet 2   SYNTHROID 50 MCG tablet Take 50 mcg by mouth daily.  2   warfarin (COUMADIN) 2.5 MG tablet TAKE 1 TO 1 & 1/2 TABLETS BY MOUTH DAILY AS DIRECTED BY COUMADIN CLINIC 135 tablet 0    Allergies as of 03/01/2021 - Review Complete 03/01/2021  Allergen Reaction Noted   Ace inhibitors Cough 08/01/2008   Lipitor [atorvastatin calcium] Other (See Comments) 04/07/2011   Codeine Nausea And Vomiting 01/19/2014    Family History  Problem Relation Age of Onset   Breast cancer Sister        multiple   Uterine cancer Sister        multiple   Heart disease Mother    Heart attack Mother    Heart disease Father    Kidney disease Father    Heart attack Father    Heart disease Sister    Crohn's disease Sister    Heart disease Son    Colon cancer Neg Hx    Stomach cancer Neg Hx     Social History   Socioeconomic History   Marital status: Married    Spouse name: [name]   Number of children: 4   Years of education: 12   Highest education level: Not on file  Occupational History   Occupation: retired  Tobacco Use   Smoking status: Former    Types: Cigarettes    Quit date: 06/02/2001  Years since quitting: 19.7   Smokeless tobacco: Never  Vaping Use   Vaping Use: Never used  Substance and Sexual Activity   Alcohol use: Yes    Alcohol/week: 0.0 standard drinks    Comment: social-1 every 6 months   Drug use: No   Sexual activity: Not on file  Other Topics Concern    Not on file  Social History Narrative   Drinks very little caffeine    Social Determinants of Health   Financial Resource Strain: Not on file  Food Insecurity: Not on file  Transportation Needs: Not on file  Physical Activity: Not on file  Stress: Not on file  Social Connections: Not on file  Intimate Partner Violence: Not on file    Review of Systems: ROS is O/W negative except as mentioned in HPI.  Physical Exam: Vital signs in last 24 hours: Temp:  [98.4 F (36.9 C)-98.5 F (36.9 C)] 98.4 F (36.9 C) (09/30 1518) Pulse Rate:  [73-96] 80 (09/30 1600) Resp:  [13-24] 19 (09/30 1600) BP: (95-151)/(45-65) 123/45 (09/30 1600) SpO2:  [91 %-100 %] 99 % (09/30 1600)   General:  Alert, Well-developed, well-nourished, pleasant and cooperative in NAD; pale Head:  Normocephalic and atraumatic. Eyes:  Sclera clear, no icterus.  Conjunctiva pale. Ears:  Normal auditory acuity. Mouth:  No deformity or lesions.   Lungs:  Clear throughout to auscultation.  No wheezes, crackles, or rhonchi.  Heart:  Irregular.  Valve click noted. Abdomen:  Soft, non-distended.  BS present.  No significant tenderness on exam, Rectal:  Deferred.  Heme positive per EDP.  Msk:  Symmetrical without gross deformities. Pulses:  Normal pulses noted. Extremities:  Without clubbing or edema. Neurologic:  Alert and oriented x 4;  grossly normal neurologically. Skin:  Intact without significant lesions or rashes. Psych:  Alert and cooperative. Normal mood and affect.  Lab Results: Recent Labs    03/01/21 1140  WBC 16.5*  HGB 5.0*  HCT 15.8*  PLT 200   BMET Recent Labs    03/01/21 1140  NA 137  K 4.9  CL 107  CO2 24  GLUCOSE 114*  BUN 36*  CREATININE 1.39*  CALCIUM 8.5*   LFT Recent Labs    03/01/21 1140  PROT 5.1*  ALBUMIN 2.5*  AST 24  ALT 15  ALKPHOS 59  BILITOT 0.7   PT/INR Recent Labs    03/01/21 1140  LABPROT 67.0*  INR 8.0*   IMPRESSION:  *GI bleed in the setting of  supratherapeutic INR:  Reports clots in stool, not black per se.  Anything could bleed with an INR of 8.  ? Diverticular vs ulcer, etc. *ABLA:  Hgb 5.0 grams as compared to 8.9 grams in 10/2019. *Coumadin coagulopathy with INR of 8.0.  They are giving some Vitamin K. *St. [name] mechanical MV replacement *Leukocytosis of 16.5K:  ? Reactive.  CT will help sort that out.  PLAN: -Transfuse and monitor Hgb. -PPI 40 mg IV BID. -Try to reverse her INR some. -Will tentatively plan for EGD on 10/1 if INR low enough, otherwise will postpone to Sunday, 10/2 -Follow-up results of CT scan that has been ordered.  Laban Emperor. Bao Bazen  03/01/2021, 4:34 PM

## 2021-03-01 NOTE — ED Notes (Signed)
Patient transported to CT 

## 2021-03-01 NOTE — H&P (Signed)
History and Physical    [name] IRS:[id number] DOB: 09-Jun-1940 DOA: 03/01/2021  PCP: Leanna Battles, MD  Patient coming from: Home.  Chief Complaint: Rectal bleeding.  HPI: [name] is a 80 y.o. female with history of mechanical mitral valve replacement, CHF, pacemaker placement, chronic kidney disease stage III, chronic anemia has been experiencing multiple episodes of rectal bleeding since this morning.  Had mild abdominal discomfort mostly the right flank area.  Had 1 episode of nausea vomiting which was nonbloody.  About 2 weeks ago patient was treated for pneumonia with antibiotics.  Patient denies taking any NSAIDs.  Patient is on Coumadin for the mechanical valve.  ED Course: In the ER CT abdomen pelvis done did not show anything acute.  Labs are significant for hemoglobin of 5 which dropped from 8.9 when compared to the one done in May 2021.  Patient's INR is supratherapeutic at 8.  On-call gastroenterologist was consulted.  Patient was given vitamin K IV and also PRBC transfusion was ordered.  Patient admitted for acute GI bleed.  COVID test is negative.  Review of Systems: As per HPI, rest all negative.   Past Medical History:  Diagnosis Date   Allergy    seasonal   Anemia    Anxiety    Arthritis    Asthma    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 Protecta   Biventricular ICD (implantable cardioverter-defibrillator) in place    with CRT   Blood transfusion without reported diagnosis    Bursitis    Cataract    RIGHT EYE   CHF (congestive heart failure) ([place])    Chronic kidney disease    Colon polyp    adenomatous   Complication of anesthesia    patient stated that had difficulty getting the breathing tube removed, patient said that she stopped breathing and HR dropped to 10  patient then woke up and started breathing pateint stated no longer than one minute; re-intubated in PACU following cholecystectomy 10/28/13    COPD (chronic obstructive pulmonary disease) ([place])    Depression    Diverticulosis    Dysrhythmia    Fibromyalgia    GERD (gastroesophageal reflux disease)    Gout    H/O mitral valve replacement 2002, 2007   Heart murmur    Hemorrhoids    Hyperlipidemia    Hypertension    Hypothyroidism    IBS (irritable bowel syndrome)    MVP (mitral valve prolapse)    Neuromuscular disorder ([place])    fibromyalgia   Pacemaker    Peptic ulcer disease     Past Surgical History:  Procedure Laterality Date   ABDOMINAL HYSTERECTOMY     BIV ICD GENERATOR CHANGEOUT N/A 04/29/2017   Procedure: BIV ICD GENERATOR CHANGEOUT;  Surgeon: Sanda Klein, MD;  Location: [facility];  Service: Cardiovascular;  Laterality: N/A;   CARDIAC CATHETERIZATION  02/25/2006   normal left main, normal LAD, normal L Cfx, normal/dominant RCA (Dr. [name])   [place]  2007, 11/202012   x2 (pacemaker) (Dr. Jerilynn Mages. Croitoru)   [place]  2002   MV repair - Dr. Berle Mull   CHOLECYSTECTOMY N/A 10/28/2013   Procedure: LAPAROSCOPIC CHOLECYSTECTOMY WITH INTRAOPERATIVE CHOLANGIOGRAM;  Surgeon: Edward Jolly, MD;  Location: [facility];  Service: General;  Laterality: N/A;   COLONOSCOPY     EYE SURGERY     IMPLANTABLE CARDIOVERTER DEFIBRILLATOR (ICD) GENERATOR CHANGE N/A 04/10/2011   Procedure: ICD GENERATOR CHANGE;  Surgeon: Sanda Klein, MD;  Location: [facility];  Service: Cardiovascular;  Laterality: N/A;   INSERT / REPLACE / REMOVE PACEMAKER     KNEE ARTHROSCOPY     MITRAL VALVE REPLACEMENT  02/26/2006   re-do MVR w/42m St. [name] (Dr. BEllison Hughs   [place] 2005   persantine myoview - low ris, EF 63%   RIGHT HEART CATH  04/03/2006   pulm cap wedge pressure 24/24, PA pressure 43/22 (mean 339mg), CO 4.8, CI 4.1 (Dr. J.Jackie Plum  TOTAL SHOULDER ARTHROPLASTY Right 12/27/2015   Procedure: RIGHT TOTAL SHOULDER ARTHROPLASTY;  Surgeon: JuTania AdeMD;   [facility] Service: [facility]ics;  Laterality: Right;  Right total shoulder arthroplasty   TOTAL SHOULDER ARTHROPLASTY Left 10/27/2019   Procedure: REVERSE TOTAL SHOULDER ARTHROPLASTY;  Surgeon: ChTania AdeMD;  Location: [facility];  Service: Orthopedics;  Laterality: Left;   TRANSTHORACIC ECHOCARDIOGRAM  12/2011   EF 50-55%, mild global hypokinesis; LA severely dilated; calcification of anterior/posterior MV leaflets, bi-leaflets St. [name] mechanical MV; mild TR; trace AV regurg/pulm valve regurg     reports that she quit smoking about 19 years ago. Her smoking use included cigarettes. She has never used smokeless tobacco. She reports current alcohol use. She reports that she does not use drugs.  Allergies  Allergen Reactions   Ace Inhibitors Cough   Lipitor [Atorvastatin Calcium] Other (See Comments)    Knee pain    Aspirin Other (See Comments)    Nephrologist said to not take this   Clorazepate Dipotassium Other (See Comments)    Interacts with a drug being taken   Simvastatin Other (See Comments)    Muscle pain   Tiotropium Bromide Monohydrate Other (See Comments)    Dry mouth   Codeine Nausea And Vomiting    Family History  Problem Relation Age of Onset   Breast cancer Sister        multiple   Uterine cancer Sister        multiple   Heart disease Mother    Heart attack Mother    Heart disease Father    Kidney disease Father    Heart attack Father    Heart disease Sister    Crohn's disease Sister    Heart disease Son    Colon cancer Neg Hx    Stomach cancer Neg Hx     Prior to Admission medications   Medication Sig Start Date End Date Taking? Authorizing Provider  acetaminophen (TYLENOL) 650 MG CR tablet Take 650 mg by mouth every 8 (eight) hours as needed for pain.   Yes [provider]  allopurinol (ZYLOPRIM) 300 MG tablet Take 300 mg by mouth every evening.    Yes [provider]  ALPRAZolam (XANAX) 0.25 MG tablet Take 0.25 mg by mouth at  bedtime as needed for anxiety or sleep.    Yes [provider]  amoxicillin (AMOXIL) 500 MG tablet Take 4 tablets (2,000 mg total) by mouth as directed. Patient taking differently: Take 2,000 mg by mouth See admin instructions. Take 2,000 mg by mouth one hour before dental appointments 01/28/21  Yes BeLorretta HarpMD  buPROPion ([facility]) 150 MG 12 hr tablet Take 150 mg by mouth in the morning.   Yes [provider]  esomeprazole (NEXIUM) 20 MG capsule Take 20 mg by mouth daily before breakfast.   Yes [provider]  furosemide (LASIX) 40 MG tablet TAKE 1 TABLET BY MOUTH DAILY. **  NEEDS OV FOR FUTURE REFILLS** Patient taking differently: Take 40 mg by mouth daily as needed for fluid or edema. 06/26/20  Yes Lorretta Harp, MD  icosapent Ethyl (VASCEPA) 1 g capsule Take 2 capsules (2 g total) by mouth 2 (two) times daily. 08/30/20  Yes Hilty, Nadean Corwin, MD  metoprolol succinate (TOPROL-XL) 25 MG 24 hr tablet TAKE 1 AND 1/2 TABLETS (37.5 MG TOTAL) BY MOUTH DAILY. Patient taking differently: Take 37.5 mg by mouth daily. 09/27/20  Yes Croitoru, Mihai, MD  pravastatin (PRAVACHOL) 40 MG tablet Take 40 mg at bedtime by mouth.    Yes [provider]  PROAIR HFA 108 (90 BASE) MCG/ACT inhaler Inhale 1-2 puffs into the lungs every 6 (six) hours as needed for wheezing.  01/23/15  Yes [provider]  Sennosides (SENNA) 15 MG TABS Take 15 mg by mouth at bedtime.    Yes [provider]  spironolactone (ALDACTONE) 25 MG tablet TAKE 1 TABLET BY MOUTH EVERY DAY Patient taking differently: Take 25 mg by mouth every evening. 11/29/20  Yes Hilty, Nadean Corwin, MD  SYNTHROID 50 MCG tablet Take 50 mcg by mouth daily before breakfast. 06/05/16  Yes [provider]  traMADol (ULTRAM) 50 MG tablet Take 50 mg by mouth 2 (two) times daily as needed (for pain).   Yes [provider]  VOLTAREN 1 % GEL Apply 2-4 g topically in the morning and at bedtime.    Yes [provider]  warfarin (COUMADIN) 2.5 MG tablet TAKE 1 TO 1 & 1/2 TABLETS BY MOUTH DAILY AS DIRECTED BY COUMADIN CLINIC Patient taking differently: Take 2.5-5 mg by mouth See admin instructions. Take 5 mg by mouth after 6 PM on Sun/Tues/Wed/Thurs/Sat and 2.5 mg on Mon/Fri 01/21/21  Yes Croitoru, Mihai, MD  benzonatate (TESSALON) 100 MG capsule Take 100 mg by mouth 3 (three) times daily as needed. Patient not taking: No sig reported 11/06/20   [provider]  enoxaparin (LOVENOX) 40 MG/0.4ML injection Inject 0.4 mLs (40 mg total) into the skin daily. Patient not taking: No sig reported 10/21/19   Croitoru, Dani Gobble, MD    Physical Exam: Constitutional: Moderately built and nourished. Vitals:   03/01/21 1715 03/01/21 1800 03/01/21 1845 03/01/21 1930  BP: (!) 125/47 106/89 (!) 134/45 (!) 125/51  Pulse: 72 72 79 87  Resp: (!) 25 14 14 18   Temp:      TempSrc:      SpO2: 98% 99% 98% 98%   Eyes: Anicteric no pallor. ENMT: No discharge from the ears eyes nose and mouth. Neck: No mass felt.  No neck rigidity. Respiratory: No rhonchi or crepitations. Cardiovascular: S1-S2 heard. Abdomen: Soft nontender bowel sounds present. Musculoskeletal: No edema. Skin: No rash. Neurologic: Alert awake oriented to time place and person.  Moves all extremities. Psychiatric: Appears normal.  Normal affect.   Labs on Admission: I have personally reviewed following labs and imaging studies  CBC: Recent Labs  Lab 03/01/21 1140  WBC 16.5*  NEUTROABS 14.7*  HGB 5.0*  HCT 15.8*  MCV 103.3*  PLT 878   Basic Metabolic Panel: Recent Labs  Lab 03/01/21 1140  NA 137  K 4.9  CL 107  CO2 24  GLUCOSE 114*  BUN 36*  CREATININE 1.39*  CALCIUM 8.5*   GFR: CrCl cannot be calculated (Unknown ideal weight.). Liver Function Tests: Recent Labs  Lab 03/01/21 1140  AST 24  ALT 15  ALKPHOS 59  BILITOT 0.7  PROT 5.1*  ALBUMIN 2.5*  Recent Labs  Lab 03/01/21 1140  LIPASE 37    No results for input(s): AMMONIA in the last 168 hours. Coagulation Profile: Recent Labs  Lab 03/01/21 1140  INR 8.0*   Cardiac Enzymes: No results for input(s): CKTOTAL, CKMB, CKMBINDEX, TROPONINI in the last 168 hours. BNP (last 3 results) No results for input(s): PROBNP in the last 8760 hours. HbA1C: No results for input(s): HGBA1C in the last 72 hours. CBG: No results for input(s): GLUCAP in the last 168 hours. Lipid Profile: No results for input(s): CHOL, HDL, LDLCALC, TRIG, CHOLHDL, LDLDIRECT in the last 72 hours. Thyroid Function Tests: No results for input(s): TSH, T4TOTAL, FREET4, T3FREE, THYROIDAB in the last 72 hours. Anemia Panel: No results for input(s): VITAMINB12, FOLATE, FERRITIN, TIBC, IRON, RETICCTPCT in the last 72 hours. Urine analysis:    Component Value Date/Time   COLORURINE YELLOW 10/20/2019 1033   APPEARANCEUR CLEAR 10/20/2019 1033   LABSPEC 1.009 10/20/2019 1033   PHURINE 6.0 10/20/2019 [place] 10/20/2019 1033   [place] 10/20/2019 1033   [place] 10/20/2019 [place] 10/20/2019 1033   PROTEINUR 30 (A) 10/20/2019 1033   NITRITE NEGATIVE 10/20/2019 1033   LEUKOCYTESUR TRACE (A) 10/20/2019 1033   Sepsis Labs: @LABRCNTIP (procalcitonin:4,lacticidven:4) )No results found for this or any previous visit (from the past 240 hour(s)).   Radiological Exams on Admission: CT ABDOMEN PELVIS W CONTRAST  Result Date: 03/01/2021 CLINICAL DATA:  Right upper quadrant abdominal pain. Rectal bleeding. EXAM: CT ABDOMEN AND PELVIS WITH CONTRAST TECHNIQUE: Multidetector CT imaging of the abdomen and pelvis was performed using the standard protocol following bolus administration of intravenous contrast. CONTRAST:  46m OMNIPAQUE IOHEXOL 300 MG/ML  SOLN COMPARISON:  09/01/2013 FINDINGS: Lower Chest: No acute findings. Hepatobiliary: No hepatic masses identified. Prior cholecystectomy. No evidence of biliary obstruction.  Pancreas:  No mass or inflammatory changes. Spleen: Within normal limits in size and appearance. Adrenals/Urinary Tract: No masses identified. Tiny sub-cm cyst noted in both kidneys. No evidence of ureteral calculi or hydronephrosis. Unremarkable unopacified urinary bladder. Stomach/Bowel: No evidence of obstruction, inflammatory process or abnormal fluid collections. Normal appendix visualized. Diverticulosis is seen mainly involving the sigmoid colon, however there is no evidence of diverticulitis. Vascular/Lymphatic: No pathologically enlarged lymph nodes. No acute vascular findings. Aortic atherosclerotic calcification noted. Reproductive: Prior hysterectomy. A benign-appearing cyst is seen in the right adnexa measuring 2.4 x 2.2 cm. No other masses identified. No evidence of ascites. Other:  None. Musculoskeletal:  No suspicious bone lesions identified. IMPRESSION: Colonic diverticulosis, without radiographic evidence of diverticulitis or other acute findings. 2.4 cm right adnexal simple-appearing cyst. No follow-up imaging is recommended. Reference: JACR 2020 Feb;17(2):248-254 Aortic Atherosclerosis (ICD10-I70.0). Electronically Signed   By: JMarlaine HindM.D.   On: 03/01/2021 18:55      Assessment/Plan Principal Problem:   Acute GI bleeding Active Problems:   Automatic implantable cardioverter-defibrillator in situ   History of mitral valve replacement with mechanical valve   Hypertension   Coagulopathy (HCC)    Acute GI bleeding -appreciate GI consult.  Gastroenterology is planning EGD in the morning for which patient be kept n.p.o. past midnight. Acute blood loss anemia in the setting of coagulopathy for which PRBC transfusion has been ordered.  Follow CBC. Coagulopathy in the setting of Coumadin vitamin K IV has been ordered in the setting of acute GI bleed with significant loss of blood.  Follow INR.  Patient is aware of the reversal of Coumadin and its effects on the  valve.  Patient agrees  to the reversal. Mechanical mitral valve presently having acute GI bleed so Coumadin held and vitamin K IV was given. History of CHF presently receiving PRBC transfusion. Chronic kidney disease stage III creatinine appears to be at baseline. History of hypothyroidism presently NPO. History of hypertension antihypertensives on hold due to acute GI bleed.  Since patient has acute GI bleed in the setting of coagulopathy will need close monitoring and further management and inpatient status.   DVT prophylaxis: SCDs.  Avoiding anticoagulation in the setting of GI bleed. Code Status: Full code. Family Communication: Discussed with patient. Disposition Plan: Home. Consults called: Copywriter, advertising. Admission status: Inpatient.   Rise Patience MD [facility] Pager [phone].  If 7PM-7AM, please contact night-coverage [email or web address] Password [email or web address]  03/01/2021, 8:59 PM

## 2021-03-01 NOTE — ED Notes (Signed)
Pt finished 1 1/2 bottles of oral contrast.

## 2021-03-02 ENCOUNTER — Inpatient Hospital Stay (HOSPITAL_COMMUNITY): Payer: Medicare Other | Admitting: Anesthesiology

## 2021-03-02 ENCOUNTER — Encounter (HOSPITAL_COMMUNITY): Payer: Self-pay | Admitting: Internal Medicine

## 2021-03-02 ENCOUNTER — Encounter (HOSPITAL_COMMUNITY)
Admission: EM | Disposition: A | Discharge status: Home / Self Care | Payer: Self-pay | Source: Home / Self Care | Attending: Family Medicine

## 2021-03-02 DIAGNOSIS — K31811 Angiodysplasia of stomach and duodenum with bleeding: Principal | ICD-10-CM

## 2021-03-02 DIAGNOSIS — K922 Gastrointestinal hemorrhage, unspecified: Secondary | ICD-10-CM | POA: Diagnosis not present

## 2021-03-02 HISTORY — PX: ESOPHAGOGASTRODUODENOSCOPY (EGD) WITH PROPOFOL: SHX5813

## 2021-03-02 HISTORY — PX: SCLEROTHERAPY: SHX6841

## 2021-03-02 HISTORY — PX: HEMOSTASIS CLIP PLACEMENT: SHX6857

## 2021-03-02 HISTORY — PX: HOT HEMOSTASIS: SHX5433

## 2021-03-02 LAB — CBC
HCT: 24 % — ABNORMAL LOW (ref 36.0–46.0)
Hemoglobin: 8 g/dL — ABNORMAL LOW (ref 12.0–15.0)
MCH: 29.2 pg (ref 26.0–34.0)
MCHC: 33.3 g/dL (ref 30.0–36.0)
MCV: 87.6 fL (ref 80.0–100.0)
Platelets: 125 10*3/uL — ABNORMAL LOW (ref 150–400)
RBC: 2.74 MIL/uL — ABNORMAL LOW (ref 3.87–5.11)
RDW: 19 % — ABNORMAL HIGH (ref 11.5–15.5)
WBC: 9.1 10*3/uL (ref 4.0–10.5)
nRBC: 0.3 % — ABNORMAL HIGH (ref 0.0–0.2)

## 2021-03-02 LAB — BASIC METABOLIC PANEL
Anion gap: 6 (ref 5–15)
BUN: 32 mg/dL — ABNORMAL HIGH (ref 8–23)
CO2: 23 mmol/L (ref 22–32)
Calcium: 7.7 mg/dL — ABNORMAL LOW (ref 8.9–10.3)
Chloride: 107 mmol/L (ref 98–111)
Creatinine, Ser: 1.32 mg/dL — ABNORMAL HIGH (ref 0.44–1.00)
GFR, Estimated: 41 mL/min — ABNORMAL LOW (ref >60)
Glucose, Bld: 111 mg/dL — ABNORMAL HIGH (ref 70–99)
Potassium: 4.5 mmol/L (ref 3.5–5.1)
Sodium: 136 mmol/L (ref 135–145)

## 2021-03-02 LAB — IRON AND TIBC
Iron: 116 ug/dL (ref 28–170)
Saturation Ratios: 49 % — ABNORMAL HIGH (ref 10.4–31.8)
TIBC: 238 ug/dL — ABNORMAL LOW (ref 250–450)
UIBC: 122 ug/dL

## 2021-03-02 LAB — FERRITIN: Ferritin: 71 ng/mL (ref 11–307)

## 2021-03-02 LAB — PROTIME-INR
INR: 1.5 — ABNORMAL HIGH (ref 0.8–1.2)
Prothrombin Time: 17.8 seconds — ABNORMAL HIGH (ref 11.4–15.2)

## 2021-03-02 LAB — PREPARE RBC (CROSSMATCH)

## 2021-03-02 LAB — VITAMIN B12: Vitamin B-12: 240 pg/mL (ref 180–914)

## 2021-03-02 LAB — FOLATE: Folate: 37 ng/mL (ref >5.9)

## 2021-03-02 SURGERY — ESOPHAGOGASTRODUODENOSCOPY (EGD) WITH PROPOFOL
Anesthesia: Monitor Anesthesia Care

## 2021-03-02 MED ORDER — LIDOCAINE 2% (20 MG/ML) 5 ML SYRINGE
INTRAMUSCULAR | Status: DC | PRN
Start: 2021-03-02 — End: 2021-03-02
  Administered 2021-03-02: 40 mg via INTRAVENOUS

## 2021-03-02 MED ORDER — SODIUM CHLORIDE 0.9% IV SOLUTION: Freq: Once | INTRAVENOUS | Status: DC

## 2021-03-02 MED ORDER — WARFARIN - PHARMACIST DOSING INPATIENT: Freq: Every day | Status: DC

## 2021-03-02 MED ORDER — METOPROLOL SUCCINATE ER 25 MG PO TB24
12.5000 mg | ORAL_TABLET | Freq: Every day | ORAL | Status: DC
  Administered 2021-03-02 – 2021-03-05 (×4): 12.5 mg via ORAL
  Filled 2021-03-02 (×4): qty 1

## 2021-03-02 MED ORDER — BUPROPION HCL ER (SR) 150 MG PO TB12
150.0000 mg | ORAL_TABLET | Freq: Every day | ORAL | Status: DC
  Administered 2021-03-03 – 2021-03-05 (×3): 150 mg via ORAL
  Filled 2021-03-02 (×3): qty 1

## 2021-03-02 MED ORDER — ALPRAZOLAM 0.25 MG PO TABS
0.2500 mg | ORAL_TABLET | Freq: Every evening | ORAL | Status: DC | PRN
  Administered 2021-03-03: 0.25 mg via ORAL
  Filled 2021-03-02: qty 1

## 2021-03-02 MED ORDER — PROPOFOL 500 MG/50ML IV EMUL
INTRAVENOUS | Status: DC | PRN
  Administered 2021-03-02: 125 ug/kg/min via INTRAVENOUS

## 2021-03-02 MED ORDER — SODIUM CHLORIDE (PF) 0.9 % IJ SOLN
PREFILLED_SYRINGE | INTRAMUSCULAR | Status: DC | PRN
  Administered 2021-03-02: 1 mL

## 2021-03-02 MED ORDER — PROPOFOL 10 MG/ML IV BOLUS
INTRAVENOUS | Status: DC | PRN
  Administered 2021-03-02 (×2): 10 mg via INTRAVENOUS
  Administered 2021-03-02: 20 mg via INTRAVENOUS
  Administered 2021-03-02 (×2): 10 mg via INTRAVENOUS

## 2021-03-02 MED ORDER — EPINEPHRINE 1 MG/10ML IJ SOSY
PREFILLED_SYRINGE | INTRAMUSCULAR | Status: AC
  Filled 2021-03-02: qty 10

## 2021-03-02 MED ORDER — SODIUM CHLORIDE 0.9 % IV SOLN: INTRAVENOUS | Status: DC | PRN

## 2021-03-02 MED ORDER — LEVOTHYROXINE SODIUM 50 MCG PO TABS
50.0000 ug | ORAL_TABLET | Freq: Every day | ORAL | Status: DC
  Administered 2021-03-03 – 2021-03-05 (×3): 50 ug via ORAL
  Filled 2021-03-02 (×3): qty 1

## 2021-03-02 NOTE — Transfer of Care (Signed)
Immediate Anesthesia Transfer of Care Note  Patient: Halee Glynn  Procedure(s) Performed: ESOPHAGOGASTRODUODENOSCOPY (EGD) WITH PROPOFOL HEMOSTASIS CLIP PLACEMENT SCLEROTHERAPY HOT HEMOSTASIS (ARGON PLASMA COAGULATION/BICAP)  Patient Location: PACU  Anesthesia Type:MAC  Level of Consciousness: awake, alert  and oriented  Airway & Oxygen Therapy: Patient Spontanous Breathing  Post-op Assessment: Report given to RN and Post -op Vital signs reviewed and stable  Post vital signs: Reviewed and stable  Last Vitals:  Vitals Value Taken Time  BP 156/54 03/02/21 1239  Temp 36.7 C 03/02/21 1239  Pulse 85 03/02/21 1239  Resp 20 03/02/21 1239  SpO2 98 % 03/02/21 1239    Last Pain:  Vitals:   03/02/21 1239  TempSrc:   PainSc: 0-No pain         Complications: No notable events documented.

## 2021-03-02 NOTE — Op Note (Signed)
[facility] Patient Name: [name] Procedure Date : 03/02/2021 MRN: [id number] Attending MD: Gladstone Pih. Candis Schatz , MD Date of Birth: 29-Oct-1940 CSN: [id number] Age: 80 Admit Type: Inpatient Procedure:                Upper GI endoscopy Indications:              Active gastrointestinal bleeding Providers:                Nicki Reaper E. Candis Schatz, MD, Grace Isaac, RN, Benetta Spar, Technician Referring MD:              Medicines:                Monitored Anesthesia Care Complications:            No immediate complications. Estimated Blood Loss:     Estimated blood loss: none. Procedure:                Pre-Anesthesia Assessment:                           - Prior to the procedure, a History and Physical                            was performed, and patient medications and                            allergies were reviewed. The patient's tolerance of                            previous anesthesia was also reviewed. The risks                            and benefits of the procedure and the sedation                            options and risks were discussed with the patient.                            All questions were answered, and informed consent                            was obtained. Prior Anticoagulants: The patient has                            taken Coumadin (warfarin), last dose was 2 days                            prior to procedure. ASA Grade Assessment: III - A                            patient with severe systemic disease. After  reviewing the risks and benefits, the patient was                            deemed in satisfactory condition to undergo the                            procedure.                           After obtaining informed consent, the endoscope was                            passed under direct vision. Throughout the                            procedure, the patient's blood pressure,  pulse, and                            oxygen saturations were monitored continuously. The                            [id number]-[id number] ([id number]) Olympus endoscope was introduced                            through the mouth, and advanced to the fourth part                            of duodenum. The upper GI endoscopy was                            accomplished without difficulty. The patient                            tolerated the procedure well. Scope In: Scope Out: Findings:      The examined portions of the nasopharynx, oropharynx and larynx were       normal.      The examined esophagus was normal.      A 3 cm hiatal hernia was present.      Diffuse mild mucosal changes characterized by pale mucosa were found in       the entire examined stomach. There was some bright red blood in the       antrum/prepyloric stomach. The stomach was otherwise normal.      Red blood was found in the duodenal bulb and in the second portion of       the duodenum.      A single diminutive angioectasia vs dieulafoy lesion with active       bleeding was found in the second portion of the duodenum. For       hemostasis, two hemostatic clips were successfully placed (MR       conditional), but the lesion continued to bleed. The area was then       successfully injected with 1 mL of a 1:10,000 solution of epinephrine       for hemostasis. Blanching occured and bleeding stopped temporarily, but       then resumed. Next, coagulation for hemostasis using  argon plasma at 1       liter/minute and 20 watts was used and was successful in stopping the       bleeding. The site was observed for a minute with no recurrence of       bleeding. A magnet was placed over the patient's PPM/AICD prior to usage       of APC. Estimated blood loss: none.      The exam of the duodenum was otherwise normal. Impression:               - The examined portions of the nasopharynx,                            oropharynx and larynx were  normal.                           - Normal esophagus.                           - 3 cm hiatal hernia.                           - Pale mucosa in the stomach.                           - Blood in the duodenal bulb and in the second                            portion of the duodenum.                           - A single bleeding angioectasia vs. Dieulafoy                            lesion in the duodenum. Injected. Clips (MR                            conditional) were placed. Treated with argon plasma                            coagulation (APC).                           - No specimens collected. Recommendation:           - Return patient to hospital ward for ongoing care.                           - Given high risk of thrombosis and acheivement of                            hemostasis today, okay to resume coumadin tomorrow,                            but would prefer holding as long as  cardiology/primary team is comfortable                           - Clear liquid diet today                           - Continue BID PPI                           - Monitor CBC q12h Procedure Code(s):        --- Professional ---                           [phone], Esophagogastroduodenoscopy, flexible,                            transoral; with control of bleeding, any method Diagnosis Code(s):        --- Professional ---                           K44.9, Diaphragmatic hernia without obstruction or                            gangrene                           K92.2, Gastrointestinal hemorrhage, unspecified                           K31.811, Angiodysplasia of stomach and duodenum                            with bleeding CPT copyright 2019 American Medical [employer]. All rights reserved. The codes documented in this report are preliminary and upon coder review may  be revised to meet current compliance requirements. [name], MD 03/02/2021 12:52:42 PM This report has  been signed electronically. Number of Addenda: 0

## 2021-03-02 NOTE — Progress Notes (Signed)
PROGRESS NOTE   [name]  QMV:[id number] DOB: 11-Apr-1941 DOA: 03/01/2021 PCP: Leanna Battles, MD  Brief Narrative:  80 year old community dwelling white female [facility] mitral valve replacement 2002 with several placement,  NYHA class II-III systolic diastolic failure with chronic left bundle-last EF 45-50% with LVH, prior shoulder replacement, benign esophageal strictures and prior diverticulosis Recent treatment for pneumonia with antibiotics not on NSAIDs Developed multiple episodes of rectal bleeding 03/02/2019 2 AM mild abdominal discomfort Hemoglobin down from 8.9-->>5, INR supratherapeutic at 8 In ED given vitamin K  Transfused 4 units of PRBC Gastroenterology consulted for input and patient underwent scope as below  Hospital-Problem based course  Acute upper GI bleed Scope showed angiectasia and patient underwent injection clips and argon plasma coagulation clear liquid diet twice daily PPI and recommended to monitor CBC every 12 Continue saline 50 cc/HI at this time Coagulopathy in the setting of Coumadin use Patient given vitamin K INR now 2.2 [name] mechanical mitral valve repair 2002 Requires anticoagulation-we will resume Coumadin per pharmacy 24 hours after scope If further bleeding will need to support with transfusion as well as starting cautious heparin NYHA class III systolic diastolic heart failure-last EF 45-50% (no acute component) Aldactone 25 Toprol-XL 37.5 Lasix 40 all on hold Will resume in the next 24 hours Depression Resume Xanax 0.25 sleep, Wellbutrin 150 every morning Hypothyroid Resume Synthroid 50 every morning    DVT prophylaxis: scd for now Code Status: full Family Communication: husband bedside Disposition:  Status is: Inpatient  Remains inpatient appropriate because:Persistent severe electrolyte disturbances, Ongoing diagnostic testing needed not appropriate for outpatient work up, and Unsafe d/c plan  Dispo: The patient  is from: Home              Anticipated d/c is to: Home              Patient currently is not medically stable to d/c.   Difficult to place patient No       Consultants:  gi  Procedures:  EGD 10/1  Antimicrobials: n    Subjective: Seen prior to procedure Has had some bleeding throughout the night has received total of 4 units of blood No nausea no vomiting No chest pain Overall feels better but does still feel weak  Objective: Vitals:   03/02/21 0554 03/02/21 0617 03/02/21 0633 03/02/21 0731  BP: (!) 128/44 (!) 128/44 (!) 108/52 (!) 156/53  Pulse: 84 84 81 82  Resp: 20 20 19 19   Temp: 99.2 F (37.3 C) 99.2 F (37.3 C) 99 F (37.2 C) 99 F (37.2 C)  TempSrc: Oral Oral Oral Oral  SpO2:   98% 99%  Weight:      Height:        Intake/Output Summary (Last 24 hours) at 03/02/2021 0802 Last data filed at 03/02/2021 0554 Gross per 24 hour  Intake 957.09 ml  Output --  Net 957.09 ml   Filed Weights   03/02/21 0303  Weight: 48.4 kg    Examination:   Pleasant coherent slim wf Younger than stated age EOMI NCAT no focal deficit CTA B no rales rhonchi Soft no rebound no guarding S1-S2 some PVCs no murmur ROM intact no focal deficit Power 5/5   Data Reviewed: personally reviewed   CBC    Component Value Date/Time   WBC 9.0 03/01/2021 2151   RBC 1.95 (L) 03/01/2021 2151   HGB 6.2 (LL) 03/01/2021 2151   HGB 11.6 04/08/2017 1247   HCT 18.9 (L) 03/01/2021  2151   HCT 34.5 04/08/2017 1247   PLT 144 (L) 03/01/2021 2151   PLT 283 04/08/2017 1247   MCV 96.9 03/01/2021 2151   MCV 92 04/08/2017 1247   MCH 31.8 03/01/2021 2151   MCHC 32.8 03/01/2021 2151   RDW 15.7 (H) 03/01/2021 2151   RDW 16.4 (H) 04/08/2017 1247   LYMPHSABS 0.8 03/01/2021 2151   MONOABS 0.6 03/01/2021 2151   EOSABS 0.1 03/01/2021 2151   BASOSABS 0.0 03/01/2021 2151   CMP Latest Ref Rng & Units 03/01/2021 07/31/2020 10/28/2019  Glucose 70 - 99 mg/dL 114(H) - 121(H)  BUN 8 - 23 mg/dL 36(H) -  19  Creatinine 0.44 - 1.00 mg/dL 1.39(H) - 1.52(H)  Sodium 135 - 145 mmol/L 137 - 137  Potassium 3.5 - 5.1 mmol/L 4.9 - 4.4  Chloride 98 - 111 mmol/L 107 - 104  CO2 22 - 32 mmol/L 24 - 25  Calcium 8.9 - 10.3 mg/dL 8.5(L) - 8.3(L)  Total Protein 6.5 - 8.1 g/dL 5.1(L) 7.5 -  Total Bilirubin 0.3 - 1.2 mg/dL 0.7 0.4 -  Alkaline Phos 38 - 126 U/L 59 112 -  AST 15 - 41 U/L 24 36 -  ALT 0 - 44 U/L 15 21 -     Radiology Studies: CT ABDOMEN PELVIS W CONTRAST  Result Date: 03/01/2021 CLINICAL DATA:  Right upper quadrant abdominal pain. Rectal bleeding. EXAM: CT ABDOMEN AND PELVIS WITH CONTRAST TECHNIQUE: Multidetector CT imaging of the abdomen and pelvis was performed using the standard protocol following bolus administration of intravenous contrast. CONTRAST:  85m OMNIPAQUE IOHEXOL 300 MG/ML  SOLN COMPARISON:  09/01/2013 FINDINGS: Lower Chest: No acute findings. Hepatobiliary: No hepatic masses identified. Prior cholecystectomy. No evidence of biliary obstruction. Pancreas:  No mass or inflammatory changes. Spleen: Within normal limits in size and appearance. Adrenals/Urinary Tract: No masses identified. Tiny sub-cm cyst noted in both kidneys. No evidence of ureteral calculi or hydronephrosis. Unremarkable unopacified urinary bladder. Stomach/Bowel: No evidence of obstruction, inflammatory process or abnormal fluid collections. Normal appendix visualized. Diverticulosis is seen mainly involving the sigmoid colon, however there is no evidence of diverticulitis. Vascular/Lymphatic: No pathologically enlarged lymph nodes. No acute vascular findings. Aortic atherosclerotic calcification noted. Reproductive: Prior hysterectomy. A benign-appearing cyst is seen in the right adnexa measuring 2.4 x 2.2 cm. No other masses identified. No evidence of ascites. Other:  None. Musculoskeletal:  No suspicious bone lesions identified. IMPRESSION: Colonic diverticulosis, without radiographic evidence of diverticulitis or  other acute findings. 2.4 cm right adnexal simple-appearing cyst. No follow-up imaging is recommended. Reference: JACR 2020 Feb;17(2):248-254 Aortic Atherosclerosis (ICD10-I70.0). Electronically Signed   By: JMarlaine HindM.D.   On: 03/01/2021 18:55     Scheduled Meds:  sodium chloride   Intravenous Once   pantoprazole (PROTONIX) IV  40 mg Intravenous Q12H   Continuous Infusions:  sodium chloride 50 mL/hr at 03/01/21 2227     LOS: 1 day   Time spent: 4[place] MD [facility] To contact the attending provider between 7A-7P or the covering provider during after hours 7P-7A, please log into the web site [email or web address] and access using universal  password for that web site. If you do not have the password, please call the hospital operator.  03/02/2021, 8:02 AM

## 2021-03-02 NOTE — Anesthesia Preprocedure Evaluation (Addendum)
Anesthesia Evaluation  Patient identified by MRN, date of birth, ID band Patient awake    Reviewed: Allergy & Precautions, NPO status , Patient's Chart, lab work & pertinent test results  Airway Mallampati: I  TM Distance: >3 FB Neck ROM: Full    Dental  (+) Edentulous Upper, Missing,    Pulmonary asthma , COPD, former smoker,    Pulmonary exam normal        Cardiovascular hypertension, Pt. on home beta blockers +CHF  + dysrhythmias + pacemaker + Cardiac Defibrillator + Valvular Problems/Murmurs  Rhythm:Regular Rate:Normal     Neuro/Psych PSYCHIATRIC DISORDERS Anxiety Depression    GI/Hepatic Neg liver ROS, PUD, GERD  Medicated,  Endo/Other  Hypothyroidism   Renal/GU Renal disease     Musculoskeletal  (+) Arthritis , Fibromyalgia -  Abdominal Normal abdominal exam  (+)   Peds  Hematology   Anesthesia Other Findings   Reproductive/Obstetrics                            Anesthesia Physical Anesthesia Plan  ASA: 2  Anesthesia Plan: MAC   Post-op Pain Management:    Induction: Intravenous  PONV Risk Score and Plan: Propofol infusion  Airway Management Planned: Natural Airway and Simple Face Mask  Additional Equipment: None  Intra-op Plan:   Post-operative Plan:   Informed Consent: I have reviewed the patients History and Physical, chart, labs and discussed the procedure including the risks, benefits and alternatives for the proposed anesthesia with the patient or authorized representative who has indicated his/her understanding and acceptance.       Plan Discussed with: CRNA  Anesthesia Plan Comments: (Pt received 2U PRBC's, repeat labs pending.   Lab Results      Component                Value               Date                      WBC                      9.0                 03/01/2021                HGB                      6.2 (LL)            03/01/2021                 HCT                      18.9 (L)            03/01/2021                MCV                      96.9                03/01/2021                PLT                      144 (L)  03/01/2021           )       Anesthesia Quick Evaluation

## 2021-03-02 NOTE — Anesthesia Postprocedure Evaluation (Signed)
Anesthesia Post Note  Patient: Dionna Wiedemann  Procedure(s) Performed: ESOPHAGOGASTRODUODENOSCOPY (EGD) WITH PROPOFOL HEMOSTASIS CLIP PLACEMENT SCLEROTHERAPY HOT HEMOSTASIS (ARGON PLASMA COAGULATION/BICAP)     Patient location during evaluation: PACU Anesthesia Type: MAC Level of consciousness: awake and alert Pain management: pain level controlled Vital Signs Assessment: post-procedure vital signs reviewed and stable Respiratory status: spontaneous breathing, nonlabored ventilation, respiratory function stable and patient connected to nasal cannula oxygen Cardiovascular status: stable and blood pressure returned to baseline Postop Assessment: no apparent nausea or vomiting Anesthetic complications: no   No notable events documented.  Last Vitals:  Vitals:   03/02/21 1254 03/02/21 1327  BP: (!) 157/52 (!) 151/57  Pulse: 82 80  Resp: 18 20  Temp: 36.7 C 37.1 C  SpO2: 98% 99%    Last Pain:  Vitals:   03/02/21 1327  TempSrc: Oral  PainSc: 0-No pain                 Effie Berkshire

## 2021-03-02 NOTE — Interval H&P Note (Signed)
History and Physical Interval Note:  Ms. Moster continued to have frequent passage of clots and bright red blood and maroon stools overnight.  Her hemoglobin only went up to 6 after 2 units pRBCsand she received a third unit this morning.  She has remained hemodynamically stable.  Her INR improved to 2 from 8 after receiving 1 mg IV Vit K.  We will plan for an EGD today and then potentially a colonoscopy tomorrow if she continues to bleed and no upper source is identified today.    03/02/2021 10:31 AM  [name]  has presented today for surgery, with the diagnosis of GI bleed, anemia.  The various methods of treatment have been discussed with the patient and family. After consideration of risks, benefits and other options for treatment, the patient has consented to  Procedure(s): ESOPHAGOGASTRODUODENOSCOPY (EGD) WITH PROPOFOL (N/A) as a surgical intervention.  The patient's history has been reviewed, patient examined, no change in status, stable for surgery.  I have reviewed the patient's chart and labs.  Questions were answered to the patient's satisfaction.     Daryel November

## 2021-03-02 NOTE — ED Notes (Signed)
This RN received a call from admitting. Per MD they will transfuse 2 more units. This RN will continue to monitor.

## 2021-03-03 DIAGNOSIS — K922 Gastrointestinal hemorrhage, unspecified: Secondary | ICD-10-CM | POA: Diagnosis not present

## 2021-03-03 LAB — CBC WITH DIFFERENTIAL/PLATELET
Abs Immature Granulocytes: 0.12 10*3/uL — ABNORMAL HIGH (ref 0.00–0.07)
Basophils Absolute: 0 10*3/uL (ref 0.0–0.1)
Basophils Relative: 0 %
Eosinophils Absolute: 0.3 10*3/uL (ref 0.0–0.5)
Eosinophils Relative: 3 %
HCT: 21.1 % — ABNORMAL LOW (ref 36.0–46.0)
Hemoglobin: 7.1 g/dL — ABNORMAL LOW (ref 12.0–15.0)
Immature Granulocytes: 1 %
Lymphocytes Relative: 9 %
Lymphs Abs: 0.8 10*3/uL (ref 0.7–4.0)
MCH: 29.5 pg (ref 26.0–34.0)
MCHC: 33.6 g/dL (ref 30.0–36.0)
MCV: 87.6 fL (ref 80.0–100.0)
Monocytes Absolute: 0.8 10*3/uL (ref 0.1–1.0)
Monocytes Relative: 8 %
Neutro Abs: 7.5 10*3/uL (ref 1.7–7.7)
Neutrophils Relative %: 79 %
Platelets: 121 10*3/uL — ABNORMAL LOW (ref 150–400)
RBC: 2.41 MIL/uL — ABNORMAL LOW (ref 3.87–5.11)
RDW: 18.9 % — ABNORMAL HIGH (ref 11.5–15.5)
WBC: 9.6 10*3/uL (ref 4.0–10.5)
nRBC: 0.2 % (ref 0.0–0.2)

## 2021-03-03 LAB — COMPREHENSIVE METABOLIC PANEL
ALT: 12 U/L (ref 0–44)
AST: 26 U/L (ref 15–41)
Albumin: 2.1 g/dL — ABNORMAL LOW (ref 3.5–5.0)
Alkaline Phosphatase: 38 U/L (ref 38–126)
Anion gap: 5 (ref 5–15)
BUN: 23 mg/dL (ref 8–23)
CO2: 24 mmol/L (ref 22–32)
Calcium: 7.7 mg/dL — ABNORMAL LOW (ref 8.9–10.3)
Chloride: 107 mmol/L (ref 98–111)
Creatinine, Ser: 1.24 mg/dL — ABNORMAL HIGH (ref 0.44–1.00)
GFR, Estimated: 45 mL/min — ABNORMAL LOW (ref >60)
Glucose, Bld: 94 mg/dL (ref 70–99)
Potassium: 4.1 mmol/L (ref 3.5–5.1)
Sodium: 136 mmol/L (ref 135–145)
Total Bilirubin: 0.6 mg/dL (ref 0.3–1.2)
Total Protein: 4.1 g/dL — ABNORMAL LOW (ref 6.5–8.1)

## 2021-03-03 LAB — HEPARIN LEVEL (UNFRACTIONATED): Heparin Unfractionated: <0.1 IU/mL — ABNORMAL LOW (ref 0.30–0.70)

## 2021-03-03 LAB — PROTIME-INR
INR: 1.3 — ABNORMAL HIGH (ref 0.8–1.2)
Prothrombin Time: 16.3 seconds — ABNORMAL HIGH (ref 11.4–15.2)

## 2021-03-03 MED ORDER — WARFARIN SODIUM 2.5 MG PO TABS
2.5000 mg | ORAL_TABLET | Freq: Once | ORAL | Status: AC
  Administered 2021-03-03: 2.5 mg via ORAL
  Filled 2021-03-03: qty 1

## 2021-03-03 MED ORDER — HEPARIN (PORCINE) 25000 UT/250ML-% IV SOLN
900.0000 [IU]/h | INTRAVENOUS | Status: DC
  Administered 2021-03-03: 600 [IU]/h via INTRAVENOUS
  Administered 2021-03-04: 900 [IU]/h via INTRAVENOUS
  Filled 2021-03-03 (×2): qty 250

## 2021-03-03 NOTE — Progress Notes (Signed)
[facility] [facility] [facility] ROUNDING NOTE   Subjective: Patient had no events overnight.  She passed a small amount of black appearing stool, but no more bright red blood or clots like she had been.  Not feeling weak, dizzy or light-headed.  Wondering when she can go home. Hgb drifted down from 8 to 7.1 this morning   Objective: Vital signs in last 24 hours: Temp:  [98 F (36.7 C)-99.2 F (37.3 C)] 99 F (37.2 C) (10/02 0020) Pulse Rate:  [72-95] 80 (10/02 0020) Resp:  [16-22] 16 (10/02 0020) BP: (132-170)/(44-65) 139/63 (10/02 0020) SpO2:  [98 %-100 %] 100 % (10/02 0020) Last BM Date: 03/02/21 General: NAD Lungs:  CTA b/l, no w/r/r Heart:  RRR, mechanical click Abdomen:  Soft, NT, ND, +BS     Intake/Output from previous day: 10/01 0701 - 10/02 0700 In: 1520.4 [P.O.:240; I.V.:778.4; Blood:502] Out: 1 [Stool:1] Intake/Output this shift: No intake/output data recorded.   Lab Results: Recent Labs    03/01/21 2151 03/02/21 1113 03/03/21 0157  WBC 9.0 9.1 9.6  HGB 6.2* 8.0* 7.1*  PLT 144* 125* 121*  MCV 96.9 87.6 87.6   BMET Recent Labs    03/01/21 1140 03/02/21 1113 03/03/21 0157  NA 137 136 136  K 4.9 4.5 4.1  CL 107 107 107  CO2 24 23 24   GLUCOSE 114* 111* 94  BUN 36* 32* 23  CREATININE 1.39* 1.32* 1.24*  CALCIUM 8.5* 7.7* 7.7*   LFT Recent Labs    03/01/21 1140 03/03/21 0157  PROT 5.1* 4.1*  ALBUMIN 2.5* 2.1*  AST 24 26  ALT 15 12  ALKPHOS 59 38  BILITOT 0.7 0.6   PT/INR Recent Labs    03/01/21 2151 03/02/21 1113  INR 2.2* 1.5*      Imaging/Other results: CT ABDOMEN PELVIS W CONTRAST  Result Date: 03/01/2021 CLINICAL DATA:  Right upper quadrant abdominal pain. Rectal bleeding. EXAM: CT ABDOMEN AND PELVIS WITH CONTRAST TECHNIQUE: Multidetector CT imaging of the abdomen and pelvis was performed using the standard protocol following bolus administration of intravenous contrast. CONTRAST:  75m OMNIPAQUE IOHEXOL 300 MG/ML  SOLN COMPARISON:   09/01/2013 FINDINGS: Lower Chest: No acute findings. Hepatobiliary: No hepatic masses identified. Prior cholecystectomy. No evidence of biliary obstruction. Pancreas:  No mass or inflammatory changes. Spleen: Within normal limits in size and appearance. Adrenals/Urinary Tract: No masses identified. Tiny sub-cm cyst noted in both kidneys. No evidence of ureteral calculi or hydronephrosis. Unremarkable unopacified urinary bladder. Stomach/Bowel: No evidence of obstruction, inflammatory process or abnormal fluid collections. Normal appendix visualized. Diverticulosis is seen mainly involving the sigmoid colon, however there is no evidence of diverticulitis. Vascular/Lymphatic: No pathologically enlarged lymph nodes. No acute vascular findings. Aortic atherosclerotic calcification noted. Reproductive: Prior hysterectomy. A benign-appearing cyst is seen in the right adnexa measuring 2.4 x 2.2 cm. No other masses identified. No evidence of ascites. Other:  None. Musculoskeletal:  No suspicious bone lesions identified. IMPRESSION: Colonic diverticulosis, without radiographic evidence of diverticulitis or other acute findings. 2.4 cm right adnexal simple-appearing cyst. No follow-up imaging is recommended. Reference: JACR 2020 Feb;17(2):248-254 Aortic Atherosclerosis (ICD10-I70.0). Electronically Signed   By: JMarlaine HindM.D.   On: 03/01/2021 18:55      Assessment and Plan: 80yo patient with GI hx with Dr. PHenrene Pastor  Benign esophageal stricture and diverticulosis.  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  Symptomatically improved, but with slight drop in Hgb, BUN improved. I have low suspicion she is actively bleeding again.  I recommend advancing diet to regular today and continued monitoring for rebleeding.  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

## 2021-03-03 NOTE — Progress Notes (Signed)
ANTICOAGULATION CONSULT NOTE - Initial Consult  Pharmacy Consult for warfarin with IV heparin bridging Indication:  mechanical mitral valve replacement  Allergies  Allergen Reactions   Ace Inhibitors Cough   Lipitor [Atorvastatin Calcium] Other (See Comments)    Knee pain    Aspirin Other (See Comments)    Nephrologist said to not take this   Clorazepate Dipotassium Other (See Comments)    Interacts with a drug being taken   Simvastatin Other (See Comments)    Muscle pain   Tiotropium Bromide Monohydrate Other (See Comments)    Dry mouth   Codeine Nausea And Vomiting    Patient Measurements: Height: 5' 2"  (157.5 cm) Weight: 48.4 kg (106 lb 12.8 oz) IBW/kg (Calculated) : 50.1 Heparin Dosing Weight: 48.4 kg  Vital Signs: Temp: 98.2 F (36.8 C) (10/02 1217) Temp Source: Oral (10/02 1217) BP: 152/53 (10/02 1217) Pulse Rate: 84 (10/02 1217)  Labs: Recent Labs    03/01/21 1140 03/01/21 2151 03/02/21 1113 03/03/21 0157 03/03/21 1038  HGB 5.0* 6.2* 8.0* 7.1*  --   HCT 15.8* 18.9* 24.0* 21.1*  --   PLT 200 144* 125* 121*  --   LABPROT 67.0* 24.8* 17.8*  --  16.3*  INR 8.0* 2.2* 1.5*  --  1.3*  CREATININE 1.39*  --  1.32* 1.24*  --     Estimated Creatinine Clearance: 28.6 mL/min (A) (by C-G formula based on SCr of 1.24 mg/dL (H)).   Medical History: Past Medical History:  Diagnosis Date   Allergy    seasonal   Anemia    Anxiety    Arthritis    Asthma    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 Protecta   Biventricular ICD (implantable cardioverter-defibrillator) in place    with CRT   Blood transfusion without reported diagnosis    Bursitis    Cataract    RIGHT EYE   CHF (congestive heart failure) ([place])    Chronic kidney disease    Colon polyp    adenomatous   Complication of anesthesia    patient stated that had difficulty getting the breathing tube removed, patient said that she stopped breathing and HR dropped to 10   patient then woke up and started breathing pateint stated no longer than one minute; re-intubated in PACU following cholecystectomy 10/28/13   COPD (chronic obstructive pulmonary disease) ([facility])    Depression    Diverticulosis    Dysrhythmia    Fibromyalgia    GERD (gastroesophageal reflux disease)    Gout    H/O mitral valve replacement 2002, 2007   Heart murmur    Hemorrhoids    Hyperlipidemia    Hypertension    Hypothyroidism    IBS (irritable bowel syndrome)    MVP (mitral valve prolapse)    Neuromuscular disorder ([facility])    fibromyalgia   Pacemaker    Peptic ulcer disease     Assessment: 80 yo female admitted for acute upper GI bleed. She was on PTA warfarin 5 mg daily except 2.5 mg on Monday and Friday. INR was 8 on admission. Patient has now had injection clips and argon plasma coagulation and pharmacy has been consulted to start warfarin and bridge with heparin due to most recent INR 1.3.   Due to recent GI bleed and elevated INR, will start with conservative dosing and will monitor closely for any s/sx of bleeding.  Goal of Therapy:  INR 2-2.5 Heparin level 0.3-0.7 units/ml Monitor platelets by anticoagulation protocol: Yes  Plan:  Heparin 600 units/hr  Warfarin 2.5 mg x1 8h heparin level then daily heparin levels, daily INR Monitor for s/sx of bleeding   Thank you for including pharmacy in the care of this patient.  Zenaida Deed, PharmD PGY1 Acute Care Pharmacy Resident  Phone: [phone] 03/03/2021  12:30 PM  Please check [email or web address] for unit-specific pharmacy phone numbers.

## 2021-03-03 NOTE — Progress Notes (Signed)
PROGRESS NOTE   [name]  BPZ:[id number] DOB: February 28, 1941 DOA: 03/01/2021 PCP: Leanna Battles, MD  Brief Narrative:  80 year old community dwelling white female [name] mitral valve replacement 2002 with several placement,  NYHA class II-III systolic diastolic failure with chronic left bundle-last EF 45-50% with LVH, prior shoulder replacement, benign esophageal strictures and prior diverticulosis Recent treatment for pneumonia with antibiotics not on NSAIDs Developed multiple episodes of rectal bleeding 03/02/2019 2 AM mild abdominal discomfort Hemoglobin down from 8.9-->>5, INR supratherapeutic at 8 In ED given vitamin K  Transfused 4 units of PRBC Gastroenterology consulted for input and patient underwent scope as below  Hospital-Problem based course  Acute upper GI bleed Scope showed angiectasia and patient underwent injection clips and argon plasma coagulation Diet graduated as per GI to a full diet 1.5 twice daily PPI and recommended to monitor CBC every 12 Continue saline 50 cc/HI at this time Anemia of acute blood loss, normocytic anemia Iron levels are adequate Hemoglobin transfusion threshold below 7 may need another unit in the morning if drops again Coagulopathy in the setting of Coumadin use Patient given vitamin K INR 1.5 Saint [name] mechanical mitral valve repair 2002 Requires anticoagulation-we will need cautious bridging with heparin GTT and resumption of Coumadin If further bleeding will need to support with transfusion as well as starting cautious heparin NYHA class III systolic diastolic heart failure-last EF 45-50% (no acute component) Aldactone 25 Lasix 40 all on hold Resumed Toprol-XL 37.5  Depression Resume Xanax 0.25 sleep, Wellbutrin 150 every morning Hypothyroid Resume Synthroid 50 every morning    DVT prophylaxis: scd for now Code Status: full Family Communication: husband bedside Disposition:  Status is: Inpatient  Remains  inpatient appropriate because:Persistent severe electrolyte disturbances, Ongoing diagnostic testing needed not appropriate for outpatient work up, and Unsafe d/c plan  Dispo: The patient is from: Home              Anticipated d/c is to: Home              Patient currently is not medically stable to d/c.   Difficult to place patient No       Consultants:  gi  Procedures:  EGD 10/1  Antimicrobials: n    Subjective:  Some bleeding which is low-volume with some clots only 2 episodes since yesterday No chest pain no fever Ready to eat and asking for food No abdominal pain no fever  Objective: Vitals:   03/02/21 1650 03/02/21 2023 03/03/21 0020 03/03/21 0824  BP: (!) 154/65 (!) 143/61 139/63 (!) 154/57  Pulse: 90 95 80 79  Resp: 19 20 16 18   Temp: 99 F (37.2 C) 99.2 F (37.3 C) 99 F (37.2 C) 98.7 F (37.1 C)  TempSrc: Oral Oral Oral Oral  SpO2: 99% 100% 100% 100%  Weight:      Height:        Intake/Output Summary (Last 24 hours) at 03/03/2021 1026 Last data filed at 03/02/2021 1400 Gross per 24 hour  Intake 1018.36 ml  Output --  Net 1018.36 ml    Filed Weights   03/02/21 0303  Weight: 48.4 kg    Examination:   Pleasant coherent slim wf EOMI NCAT Chest clear no rales rhonchi Abdomen soft no rebound No lower extremity edema S1-S2 no murmur paced rhythm on monitor   Data Reviewed: personally reviewed   CBC    Component Value Date/Time   WBC 9.6 03/03/2021 0157   RBC 2.41 (L) 03/03/2021 0157  HGB 7.1 (L) 03/03/2021 0157   HGB 11.6 04/08/2017 1247   HCT 21.1 (L) 03/03/2021 0157   HCT 34.5 04/08/2017 1247   PLT 121 (L) 03/03/2021 0157   PLT 283 04/08/2017 1247   MCV 87.6 03/03/2021 0157   MCV 92 04/08/2017 1247   MCH 29.5 03/03/2021 0157   MCHC 33.6 03/03/2021 0157   RDW 18.9 (H) 03/03/2021 0157   RDW 16.4 (H) 04/08/2017 1247   LYMPHSABS 0.8 03/03/2021 0157   MONOABS 0.8 03/03/2021 0157   EOSABS 0.3 03/03/2021 0157   BASOSABS 0.0  03/03/2021 0157   CMP Latest Ref Rng & Units 03/03/2021 03/02/2021 03/01/2021  Glucose 70 - 99 mg/dL 94 111(H) 114(H)  BUN 8 - 23 mg/dL 23 32(H) 36(H)  Creatinine 0.44 - 1.00 mg/dL 1.24(H) 1.32(H) 1.39(H)  Sodium 135 - 145 mmol/L 136 136 137  Potassium 3.5 - 5.1 mmol/L 4.1 4.5 4.9  Chloride 98 - 111 mmol/L 107 107 107  CO2 22 - 32 mmol/L 24 23 24   Calcium 8.9 - 10.3 mg/dL 7.7(L) 7.7(L) 8.5(L)  Total Protein 6.5 - 8.1 g/dL 4.1(L) - 5.1(L)  Total Bilirubin 0.3 - 1.2 mg/dL 0.6 - 0.7  Alkaline Phos 38 - 126 U/L 38 - 59  AST 15 - 41 U/L 26 - 24  ALT 0 - 44 U/L 12 - 15     Radiology Studies: CT ABDOMEN PELVIS W CONTRAST  Result Date: 03/01/2021 CLINICAL DATA:  Right upper quadrant abdominal pain. Rectal bleeding. EXAM: CT ABDOMEN AND PELVIS WITH CONTRAST TECHNIQUE: Multidetector CT imaging of the abdomen and pelvis was performed using the standard protocol following bolus administration of intravenous contrast. CONTRAST:  40m OMNIPAQUE IOHEXOL 300 MG/ML  SOLN COMPARISON:  09/01/2013 FINDINGS: Lower Chest: No acute findings. Hepatobiliary: No hepatic masses identified. Prior cholecystectomy. No evidence of biliary obstruction. Pancreas:  No mass or inflammatory changes. Spleen: Within normal limits in size and appearance. Adrenals/Urinary Tract: No masses identified. Tiny sub-cm cyst noted in both kidneys. No evidence of ureteral calculi or hydronephrosis. Unremarkable unopacified urinary bladder. Stomach/Bowel: No evidence of obstruction, inflammatory process or abnormal fluid collections. Normal appendix visualized. Diverticulosis is seen mainly involving the sigmoid colon, however there is no evidence of diverticulitis. Vascular/Lymphatic: No pathologically enlarged lymph nodes. No acute vascular findings. Aortic atherosclerotic calcification noted. Reproductive: Prior hysterectomy. A benign-appearing cyst is seen in the right adnexa measuring 2.4 x 2.2 cm. No other masses identified. No evidence of  ascites. Other:  None. Musculoskeletal:  No suspicious bone lesions identified. IMPRESSION: Colonic diverticulosis, without radiographic evidence of diverticulitis or other acute findings. 2.4 cm right adnexal simple-appearing cyst. No follow-up imaging is recommended. Reference: JACR 2020 Feb;17(2):248-254 Aortic Atherosclerosis (ICD10-I70.0). Electronically Signed   By: JMarlaine HindM.D.   On: 03/01/2021 18:55     Scheduled Meds:  sodium chloride   Intravenous Once   buPROPion  150 mg Oral Daily   levothyroxine  50 mcg Oral Q0600   metoprolol succinate  12.5 mg Oral Daily   pantoprazole (PROTONIX) IV  40 mg Intravenous Q12H   Warfarin - Pharmacist Dosing Inpatient   Does not apply q1600   Continuous Infusions:     LOS: 2 days   Time spent: 4[place] MD [facility] To contact the attending provider between 7A-7P or the covering provider during after hours 7P-7A, please log into the web site [email or web address] and access using universal [place] password for that web site. If you do not have the password, please call  the hospital operator.  03/03/2021, 10:26 AM

## 2021-03-03 NOTE — Progress Notes (Signed)
ANTICOAGULATION CONSULT NOTE - Initial Consult  Pharmacy Consult for warfarin with IV heparin bridging Indication:  mechanical mitral valve replacement  Allergies  Allergen Reactions   Ace Inhibitors Cough   Lipitor [Atorvastatin Calcium] Other (See Comments)    Knee pain    Aspirin Other (See Comments)    Nephrologist said to not take this   Clorazepate Dipotassium Other (See Comments)    Interacts with a drug being taken   Simvastatin Other (See Comments)    Muscle pain   Tiotropium Bromide Monohydrate Other (See Comments)    Dry mouth   Codeine Nausea And Vomiting    Patient Measurements: Height: 5' 2"  (157.5 cm) Weight: 48.4 kg (106 lb 12.8 oz) IBW/kg (Calculated) : 50.1 Heparin Dosing Weight: 48.4 kg  Vital Signs: Temp: 98.7 F (37.1 C) (10/02 2050) Temp Source: Oral (10/02 2050) BP: 132/77 (10/02 2050) Pulse Rate: 94 (10/02 2050)  Labs: Recent Labs    03/01/21 1140 03/01/21 2151 03/02/21 1113 03/03/21 0157 03/03/21 1038 03/03/21 2007  HGB 5.0* 6.2* 8.0* 7.1*  --   --   HCT 15.8* 18.9* 24.0* 21.1*  --   --   PLT 200 144* 125* 121*  --   --   LABPROT 67.0* 24.8* 17.8*  --  16.3*  --   INR 8.0* 2.2* 1.5*  --  1.3*  --   HEPARINUNFRC  --   --   --   --   --  <0.10*  CREATININE 1.39*  --  1.32* 1.24*  --   --      Estimated Creatinine Clearance: 28.6 mL/min (A) (by C-G formula based on SCr of 1.24 mg/dL (H)).   Medical History: Past Medical History:  Diagnosis Date   Allergy    seasonal   Anemia    Anxiety    Arthritis    Asthma    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 Protecta   Biventricular ICD (implantable cardioverter-defibrillator) in place    with CRT   Blood transfusion without reported diagnosis    Bursitis    Cataract    RIGHT EYE   CHF (congestive heart failure) ([place])    Chronic kidney disease    Colon polyp    adenomatous   Complication of anesthesia    patient stated that had difficulty  getting the breathing tube removed, patient said that she stopped breathing and HR dropped to 10  patient then woke up and started breathing pateint stated no longer than one minute; re-intubated in PACU following cholecystectomy 10/28/13   COPD (chronic obstructive pulmonary disease) ([place])    Depression    Diverticulosis    Dysrhythmia    Fibromyalgia    GERD (gastroesophageal reflux disease)    Gout    H/O mitral valve replacement 2002, 2007   Heart murmur    Hemorrhoids    Hyperlipidemia    Hypertension    Hypothyroidism    IBS (irritable bowel syndrome)    MVP (mitral valve prolapse)    Neuromuscular disorder (HCC)    fibromyalgia   Pacemaker    Peptic ulcer disease     Assessment: 80 yo female admitted for acute upper GI bleed. She was on PTA warfarin 5 mg daily except 2.5 mg on Monday and Friday. INR was 8 on admission. Vitamin K  IV 63m given on 9/30. Patient had injection clips and argon plasma coagulation and pharmacy has been consulted to start warfarin and bridge with heparin due to most  recent INR 1.3.   Due to recent GI bleed and elevated INR, started conservatively. First HL less than 0.1 and subtherapeutic.  No issues with infusion or bleeding per RN.  Goal of Therapy:  INR 2- 2.5 Heparin level 0.3-0.7 units/ml Monitor platelets by anticoagulation protocol: Yes   Plan:  Increase Heparin to 750 units/hr  Received Warfarin 2.5 mg x1 Monitor daily INR, HL, CBC/plt Monitor for signs/symptoms of bleeding    Thank you for including pharmacy in the care of this patient.  Benetta Spar, PharmD, BCPS, BCCP Clinical Pharmacist  Please check AMION for all [place] phone numbers After 10:00 PM, call [place] [phone]

## 2021-03-03 NOTE — Progress Notes (Signed)
Notified by CCMD of periods of no pacing spikes. MD notified and asked RN to reach out to [employer] representative. This RN called [employer] support and spoke with representative. Strips sent to representative for review and confirmed that pacemaker seemed to be working okay.

## 2021-03-04 ENCOUNTER — Encounter (HOSPITAL_COMMUNITY): Payer: Self-pay | Admitting: Gastroenterology

## 2021-03-04 DIAGNOSIS — Z95810 Presence of automatic (implantable) cardiac defibrillator: Secondary | ICD-10-CM

## 2021-03-04 DIAGNOSIS — K922 Gastrointestinal hemorrhage, unspecified: Secondary | ICD-10-CM | POA: Diagnosis not present

## 2021-03-04 DIAGNOSIS — Z952 Presence of prosthetic heart valve: Secondary | ICD-10-CM | POA: Diagnosis not present

## 2021-03-04 LAB — CBC WITH DIFFERENTIAL/PLATELET
Abs Immature Granulocytes: 0.08 10*3/uL — ABNORMAL HIGH (ref 0.00–0.07)
Basophils Absolute: 0 10*3/uL (ref 0.0–0.1)
Basophils Relative: 0 %
Eosinophils Absolute: 0.4 10*3/uL (ref 0.0–0.5)
Eosinophils Relative: 5 %
HCT: 20.5 % — ABNORMAL LOW (ref 36.0–46.0)
Hemoglobin: 6.9 g/dL — CL (ref 12.0–15.0)
Immature Granulocytes: 1 %
Lymphocytes Relative: 11 %
Lymphs Abs: 0.8 10*3/uL (ref 0.7–4.0)
MCH: 30.3 pg (ref 26.0–34.0)
MCHC: 33.7 g/dL (ref 30.0–36.0)
MCV: 89.9 fL (ref 80.0–100.0)
Monocytes Absolute: 0.6 10*3/uL (ref 0.1–1.0)
Monocytes Relative: 8 %
Neutro Abs: 5.6 10*3/uL (ref 1.7–7.7)
Neutrophils Relative %: 75 %
Platelets: 144 10*3/uL — ABNORMAL LOW (ref 150–400)
RBC: 2.28 MIL/uL — ABNORMAL LOW (ref 3.87–5.11)
RDW: 19.4 % — ABNORMAL HIGH (ref 11.5–15.5)
WBC: 7.5 10*3/uL (ref 4.0–10.5)
nRBC: 0.3 % — ABNORMAL HIGH (ref 0.0–0.2)

## 2021-03-04 LAB — COMPREHENSIVE METABOLIC PANEL
ALT: 26 U/L (ref 0–44)
AST: 47 U/L — ABNORMAL HIGH (ref 15–41)
Albumin: 2.4 g/dL — ABNORMAL LOW (ref 3.5–5.0)
Alkaline Phosphatase: 50 U/L (ref 38–126)
Anion gap: 6 (ref 5–15)
BUN: 13 mg/dL (ref 8–23)
CO2: 23 mmol/L (ref 22–32)
Calcium: 7.9 mg/dL — ABNORMAL LOW (ref 8.9–10.3)
Chloride: 106 mmol/L (ref 98–111)
Creatinine, Ser: 1.21 mg/dL — ABNORMAL HIGH (ref 0.44–1.00)
GFR, Estimated: 46 mL/min — ABNORMAL LOW (ref >60)
Glucose, Bld: 90 mg/dL (ref 70–99)
Potassium: 3.7 mmol/L (ref 3.5–5.1)
Sodium: 135 mmol/L (ref 135–145)
Total Bilirubin: 0.7 mg/dL (ref 0.3–1.2)
Total Protein: 4.5 g/dL — ABNORMAL LOW (ref 6.5–8.1)

## 2021-03-04 LAB — HEPARIN LEVEL (UNFRACTIONATED)
Heparin Unfractionated: 0.18 IU/mL — ABNORMAL LOW (ref 0.30–0.70)
Heparin Unfractionated: 0.17 IU/mL — ABNORMAL LOW (ref 0.30–0.70)
Heparin Unfractionated: 0.52 IU/mL (ref 0.30–0.70)

## 2021-03-04 LAB — HEMOGLOBIN AND HEMATOCRIT, BLOOD
HCT: 27.8 % — ABNORMAL LOW (ref 36.0–46.0)
Hemoglobin: 8.9 g/dL — ABNORMAL LOW (ref 12.0–15.0)

## 2021-03-04 LAB — PREPARE RBC (CROSSMATCH)

## 2021-03-04 LAB — PROTIME-INR
INR: 1.5 — ABNORMAL HIGH (ref 0.8–1.2)
Prothrombin Time: 17.9 seconds — ABNORMAL HIGH (ref 11.4–15.2)

## 2021-03-04 MED ORDER — ACETAMINOPHEN 325 MG PO TABS
650.0000 mg | ORAL_TABLET | Freq: Once | ORAL | Status: AC
  Administered 2021-03-04: 650 mg via ORAL
  Filled 2021-03-04: qty 2

## 2021-03-04 MED ORDER — FUROSEMIDE 10 MG/ML IJ SOLN: 20.0000 mg | Freq: Once | INTRAMUSCULAR | Status: DC

## 2021-03-04 MED ORDER — SODIUM CHLORIDE 0.9% IV SOLUTION: Freq: Once | INTRAVENOUS | Status: AC

## 2021-03-04 MED ORDER — SODIUM CHLORIDE 0.9% IV SOLUTION: Freq: Once | INTRAVENOUS | Status: DC

## 2021-03-04 MED ORDER — DIPHENHYDRAMINE HCL 25 MG PO CAPS: 25.0000 mg | ORAL_CAPSULE | Freq: Once | ORAL | Status: DC

## 2021-03-04 MED ORDER — WARFARIN SODIUM 2.5 MG PO TABS
2.5000 mg | ORAL_TABLET | Freq: Once | ORAL | Status: AC
  Administered 2021-03-04: 2.5 mg via ORAL
  Filled 2021-03-04: qty 1

## 2021-03-04 NOTE — Progress Notes (Addendum)
ANTICOAGULATION CONSULT NOTE - Follow Up Consult  Pharmacy Consult for heparin Indication:  MVR  Labs: Recent Labs    03/01/21 1140 03/01/21 2151 03/02/21 1113 03/03/21 0157 03/03/21 1038 03/03/21 2007 03/04/21 0119  HGB 5.0* 6.2* 8.0* 7.1*  --   --   --   HCT 15.8* 18.9* 24.0* 21.1*  --   --   --   PLT 200 144* 125* 121*  --   --   --   LABPROT 67.0* 24.8* 17.8*  --  16.3*  --  17.9*  INR 8.0* 2.2* 1.5*  --  1.3*  --  1.5*  HEPARINUNFRC  --   --   --   --   --  <0.10* 0.18*  CREATININE 1.39*  --  1.32* 1.24*  --   --   --     Assessment: 80yo female subtherapeutic on heparin after rate change; lab was drawn early so may continue to accumulate; no infusion issues or signs of bleeding per RN; Hgb 6.9 (7.1 yesterday), clots w/ BM 10/2 am but has gone three times since then with no signs of blood.  Goal of Therapy:  Heparin level 0.3-0.7 units/ml   Plan:  Will increase heparin infusion slightly to 800 units/hr and check level in 8 hours.    Wynona Neat, PharmD, BCPS  03/04/2021,2:05 AM

## 2021-03-04 NOTE — Progress Notes (Signed)
[facility] ROUNDING NOTE   Subjective: Patient did well overnight with no acute events.  Her hgb drifted down to 6.9 from 7.1 and she was given another unit pRBCs.  She feels well this morning.  She says she did not have a bowel movement yesterday.  She tolerated a regular diet.  INR 1.5 this morning, on heparin gtt   Objective: Vital signs in last 24 hours: Temp:  [98.2 F (36.8 C)-99.1 F (37.3 C)] 98.6 F (37 C) (10/03 0734) Pulse Rate:  [73-94] 85 (10/03 0734) Resp:  [15-20] 18 (10/03 0734) BP: (104-152)/(49-110) 104/49 (10/03 0734) SpO2:  [98 %-100 %] 100 % (10/03 0520) Last BM Date: 03/03/21 General: NAD, pleasant Caucasian fm Lungs:  CTA b/l, no w/r/r Heart:  RRR, mechanical click Abdomen:  Soft, NT, ND, +BS Ext:  No c/c/e    Intake/Output from previous day: 10/02 0701 - 10/03 0700 In: 282.8 [P.O.:120; I.V.:162.8] Out: -  Intake/Output this shift: Total I/O In: 365 [Blood:365] Out: -    Lab Results: Recent Labs    03/02/21 1113 03/03/21 0157 03/04/21 0119  WBC 9.1 9.6 7.5  HGB 8.0* 7.1* 6.9*  PLT 125* 121* 144*  MCV 87.6 87.6 89.9   BMET Recent Labs    03/02/21 1113 03/03/21 0157 03/04/21 0119  NA 136 136 135  K 4.5 4.1 3.7  CL 107 107 106  CO2 23 24 23   GLUCOSE 111* 94 90  BUN 32* 23 13  CREATININE 1.32* 1.24* 1.21*  CALCIUM 7.7* 7.7* 7.9*   LFT Recent Labs    03/01/21 1140 03/03/21 0157 03/04/21 0119  PROT 5.1* 4.1* 4.5*  ALBUMIN 2.5* 2.1* 2.4*  AST 24 26 47*  ALT 15 12 26   ALKPHOS 59 38 50  BILITOT 0.7 0.6 0.7   PT/INR Recent Labs    03/03/21 1038 03/04/21 0119  INR 1.3* 1.5*      Imaging/Other results: No results found.    Assessment and Plan:  80 yo patient with GI hx with Dr. Henrene Pastor.  Benign esophageal stricture and diverticulosis.  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   Hematochezia/melena appears resolved.  Hgb drifted down, requiring another unit of blood. Do not suspect active bleeding currently, but would recommend continued observation today as INR is brought back to therapeutic range.   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 - Continue BID PPI - Continue coumadin/heparin gtt per protocol - Trend CBC qam   Daryel November, MD  03/04/2021, 8:47 AM [facility]

## 2021-03-04 NOTE — Progress Notes (Signed)
PROGRESS NOTE   [name]  OEH:[id number] DOB: 08/12/1940 DOA: 03/01/2021 PCP: Leanna Battles, MD  Brief Narrative:  80 year old community dwelling white female [place] mitral valve replacement 2002 with several placement,  NYHA class II-III systolic diastolic failure with chronic left bundle-last EF 45-50% with LVH, prior shoulder replacement, benign esophageal strictures and prior diverticulosis Recent treatment for pneumonia with antibiotics not on NSAIDs Developed multiple episodes of rectal bleeding 03/02/2019 2 AM mild abdominal discomfort Hemoglobin down from 8.9-->>5, INR supratherapeutic at 8 In ED given vitamin K  Transfused 4 units of PRBC Gastroenterology consulted for input and patient underwent scope as below  Hospital-Problem based course  Acute upper GI bleed Scope showed angiectasia and patient underwent injection clips and argon plasma coagulation Diet graduated as per GI to a full diet  Monitor closely although no overt concerns for bleeding--placed back on anticoagulation so need to watch for further bleeding twice daily PPI and recommended to monitor CBC q am Continue saline 50 cc/HI at this time Anemia of acute blood loss, normocytic anemia Iron levels are adequate-transfused 4 units PRBC Hemoglobin has trended downward to 6.9-- transfuse 1 more unit 10/3 and recheck labs in the morning Coagulopathy in the setting of Coumadin use Patient given vitamin K INR 1.5 Saint [name] mechanical mitral valve repair 2002 Requires anticoagulation-cautious bridging with heparin GTT and resumption of Coumadin If further bleeding will need to support with transfusion Recheck INR in a.m. goal likely 2-2.5 for now and then back to 2.5-3.5 in several weeks NYHA class III systolic diastolic heart failure-last EF 45-50% (no acute component) Aldactone 25 Lasix 40 all on hold Resumed Toprol-XL 37.5  Depression Resume Xanax 0.25 sleep, Wellbutrin 150 every  morning Hypothyroid Resume Synthroid 50 every morning    DVT prophylaxis: scd for now Code Status: full Family Communication: No family present at bedside today Disposition:  Status is: Inpatient  Remains inpatient appropriate because:Persistent severe electrolyte disturbances, Ongoing diagnostic testing needed not appropriate for outpatient work up, and Unsafe d/c plan  Dispo: The patient is from: Home              Anticipated d/c is to: Home              Patient currently is not medically stable to d/c.   Difficult to place patient No       Consultants:  gi  Procedures:  EGD 10/1  Antimicrobials: n    Subjective:  Doing fair no bleeding ambulatory no chest pain no cough no cold  Objective: Vitals:   03/04/21 0428 03/04/21 0505 03/04/21 0520 03/04/21 0734  BP: (!) 135/57 (!) 139/51 (!) 132/58 (!) 104/49  Pulse: 74 74 73 85  Resp: 18 15 16 18   Temp: 99.1 F (37.3 C) 99.1 F (37.3 C) 99.1 F (37.3 C) 98.6 F (37 C)  TempSrc: Oral Oral  Oral  SpO2: 100% 100% 100%   Weight:      Height:        Intake/Output Summary (Last 24 hours) at 03/04/2021 0923 Last data filed at 03/04/2021 0734 Gross per 24 hour  Intake 647.83 ml  Output --  Net 647.83 ml    Filed Weights   03/02/21 0303  Weight: 48.4 kg    Examination:  Awake coherent no distress EOMI NCAT CTA B no rales rhonchi S1-S2 paced rhythm-pacemaker in place Abdomen soft no rebound No lower extremity edema ROM intact moving 4 limbs equally Smile symmetric   Data Reviewed: personally reviewed  CBC    Component Value Date/Time   WBC 7.5 03/04/2021 0119   RBC 2.28 (L) 03/04/2021 0119   HGB 6.9 (LL) 03/04/2021 0119   HGB 11.6 04/08/2017 1247   HCT 20.5 (L) 03/04/2021 0119   HCT 34.5 04/08/2017 1247   PLT 144 (L) 03/04/2021 0119   PLT 283 04/08/2017 1247   MCV 89.9 03/04/2021 0119   MCV 92 04/08/2017 1247   MCH 30.3 03/04/2021 0119   MCHC 33.7 03/04/2021 0119   RDW 19.4 (H) 03/04/2021  0119   RDW 16.4 (H) 04/08/2017 1247   LYMPHSABS 0.8 03/04/2021 0119   MONOABS 0.6 03/04/2021 0119   EOSABS 0.4 03/04/2021 0119   BASOSABS 0.0 03/04/2021 0119   CMP Latest Ref Rng & Units 03/04/2021 03/03/2021 03/02/2021  Glucose 70 - 99 mg/dL 90 94 111(H)  BUN 8 - 23 mg/dL 13 23 32(H)  Creatinine 0.44 - 1.00 mg/dL 1.21(H) 1.24(H) 1.32(H)  Sodium 135 - 145 mmol/L 135 136 136  Potassium 3.5 - 5.1 mmol/L 3.7 4.1 4.5  Chloride 98 - 111 mmol/L 106 107 107  CO2 22 - 32 mmol/L 23 24 23   Calcium 8.9 - 10.3 mg/dL 7.9(L) 7.7(L) 7.7(L)  Total Protein 6.5 - 8.1 g/dL 4.5(L) 4.1(L) -  Total Bilirubin 0.3 - 1.2 mg/dL 0.7 0.6 -  Alkaline Phos 38 - 126 U/L 50 38 -  AST 15 - 41 U/L 47(H) 26 -  ALT 0 - 44 U/L 26 12 -     Radiology Studies: No results found.   Scheduled Meds:  sodium chloride   Intravenous Once   acetaminophen  650 mg Oral Once   buPROPion  150 mg Oral Daily   levothyroxine  50 mcg Oral Q0600   metoprolol succinate  12.5 mg Oral Daily   pantoprazole (PROTONIX) IV  40 mg Intravenous Q12H   Warfarin - Pharmacist Dosing Inpatient   Does not apply q1600   Continuous Infusions:  heparin 800 Units/hr (03/04/21 0304)      LOS: 3 days   Time spent: [place], MD [facility] To contact the attending provider between 7A-7P or the covering provider during after hours 7P-7A, please log into the web site [email or web address] and access using universal [place] password for that web site. If you do not have the password, please call the hospital operator.  03/04/2021, 9:23 AM

## 2021-03-04 NOTE — Progress Notes (Signed)
ANTICOAGULATION CONSULT NOTE - Follow Up Consult  Pharmacy Consult for Heparin Indication: mech mitral valve, GIB  Allergies  Allergen Reactions   Ace Inhibitors Cough   Lipitor [Atorvastatin Calcium] Other (See Comments)    Knee pain    Aspirin Other (See Comments)    Nephrologist said to not take this   Clorazepate Dipotassium Other (See Comments)    Interacts with a drug being taken   Simvastatin Other (See Comments)    Muscle pain   Tiotropium Bromide Monohydrate Other (See Comments)    Dry mouth   Codeine Nausea And Vomiting    Patient Measurements: Height: 5' 2"  (157.5 cm) Weight: 48.4 kg (106 lb 12.8 oz) IBW/kg (Calculated) : 50.1 Heparin Dosing Weight: 48.4kg  Vital Signs: Temp: 98.5 F (36.9 C) (10/03 2009) Temp Source: Oral (10/03 2009) BP: 118/104 (10/03 2009) Pulse Rate: 81 (10/03 2009)  Labs: Recent Labs    03/02/21 1113 03/03/21 0157 03/03/21 1038 03/03/21 2007 03/04/21 0119 03/04/21 1006 03/04/21 1941  HGB 8.0* 7.1*  --   --  6.9* 8.9*  --   HCT 24.0* 21.1*  --   --  20.5* 27.8*  --   PLT 125* 121*  --   --  144*  --   --   LABPROT 17.8*  --  16.3*  --  17.9*  --   --   INR 1.5*  --  1.3*  --  1.5*  --   --   HEPARINUNFRC  --   --   --    < > 0.18* 0.17* 0.52  CREATININE 1.32* 1.24*  --   --  1.21*  --   --    < > = values in this interval not displayed.    Estimated Creatinine Clearance: 29.3 mL/min (A) (by C-G formula based on SCr of 1.21 mg/dL (H)).   Assessment: Anticoag: Warfarin PTA for mech mitral valve. INR 8 on admit > vit K x1. UGIB - no s/p injection clips and argon plasma coagulation. Warfarin restart 10/2 with goal INR 2-2.5 per MD - Hep level 0.52 now nearly goal range.  Goal of Therapy:  Heparin level 0.3-0.5 units/ml Monitor platelets by anticoagulation protocol: Yes   Plan:  Decrease IV heparin slightly to 900 units/hr Daily HL, INR, CBC   Iniko Robles S. Alford Highland, PharmD, BCPS Clinical Staff  Pharmacist [email or web address] Alford Highland, [employer] 03/04/2021,8:50 PM

## 2021-03-04 NOTE — Progress Notes (Signed)
[place] for warfarin with IV heparin bridging Indication:  mechanical mitral valve replacement  Allergies  Allergen Reactions   Ace Inhibitors Cough   Lipitor [Atorvastatin Calcium] Other (See Comments)    Knee pain    Aspirin Other (See Comments)    Nephrologist said to not take this   Clorazepate Dipotassium Other (See Comments)    Interacts with a drug being taken   Simvastatin Other (See Comments)    Muscle pain   Tiotropium Bromide Monohydrate Other (See Comments)    Dry mouth   Codeine Nausea And Vomiting    Patient Measurements: Height: 5' 2"  (157.5 cm) Weight: 48.4 kg (106 lb 12.8 oz) IBW/kg (Calculated) : 50.1 Heparin Dosing Weight: 48.4 kg  Vital Signs: Temp: 98.6 F (37 C) (10/03 0734) Temp Source: Oral (10/03 0734) BP: 104/49 (10/03 0734) Pulse Rate: 85 (10/03 0734)  Labs: Recent Labs    03/02/21 1113 03/03/21 0157 03/03/21 1038 03/03/21 2007 03/04/21 0119 03/04/21 1006  HGB 8.0* 7.1*  --   --  6.9*  --   HCT 24.0* 21.1*  --   --  20.5*  --   PLT 125* 121*  --   --  144*  --   LABPROT 17.8*  --  16.3*  --  17.9*  --   INR 1.5*  --  1.3*  --  1.5*  --   HEPARINUNFRC  --   --   --  <0.10* 0.18* 0.17*  CREATININE 1.32* 1.24*  --   --  1.21*  --      Estimated Creatinine Clearance: 29.3 mL/min (A) (by C-G formula based on SCr of 1.21 mg/dL (H)).   Medical History: Past Medical History:  Diagnosis Date   Allergy    seasonal   Anemia    Anxiety    Arthritis    Asthma    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 Protecta   Biventricular ICD (implantable cardioverter-defibrillator) in place    with CRT   Blood transfusion without reported diagnosis    Bursitis    Cataract    RIGHT EYE   CHF (congestive heart failure) ([place])    Chronic kidney disease    Colon polyp    adenomatous   Complication of anesthesia    patient stated that had difficulty getting the breathing  tube removed, patient said that she stopped breathing and HR dropped to 10  patient then woke up and started breathing pateint stated no longer than one minute; re-intubated in PACU following cholecystectomy 10/28/13   COPD (chronic obstructive pulmonary disease) ([place])    Depression    Diverticulosis    Dysrhythmia    Fibromyalgia    GERD (gastroesophageal reflux disease)    Gout    H/O mitral valve replacement 2002, 2007   Heart murmur    Hemorrhoids    Hyperlipidemia    Hypertension    Hypothyroidism    IBS (irritable bowel syndrome)    MVP (mitral valve prolapse)    Neuromuscular disorder (HCC)    fibromyalgia   Pacemaker    Peptic ulcer disease     Assessment: 80 yo female admitted for acute upper GI bleed. She was on PTA warfarin 5 mg daily except 2.5 mg on Monday and Friday. INR was 8 on admission. Vitamin K  IV 43m given on 9/30. Patient had injection clips and argon plasma coagulation and pharmacy has been consulted to start warfarin and bridge with heparin  -  heparin level = 0.17 on 800 units/hr -hg= 6.9 (5.0 on 9/30) -INR= 1.5  Goal of Therapy:  INR 2- 2.5 Heparin level 0.3-0.5 units/ml Monitor platelets by anticoagulation protocol: Yes   Plan:  Increase Heparin to 950 units/hr  Warfarin 2.5 mg x1 Heparin level in 8 hours and daily wth CBC daily Daily INR    Thank you for including pharmacy in the care of this patient.  Hildred Laser, PharmD Clinical Pharmacist **Pharmacist phone directory can now be found on [email or web address] ([email or web address]).  Listed under [place].

## 2021-03-04 NOTE — Plan of Care (Signed)
  Problem: Education: Goal: Knowledge of General Education information will improve Description Including pain rating scale, medication(s)/side effects and non-pharmacologic comfort measures Outcome: Progressing   

## 2021-03-05 DIAGNOSIS — K922 Gastrointestinal hemorrhage, unspecified: Secondary | ICD-10-CM | POA: Diagnosis not present

## 2021-03-05 LAB — TYPE AND SCREEN
ABO/RH(D): O POS
Antibody Screen: NEGATIVE
Unit division: 0
Unit division: 0
Unit division: 0
Unit division: 0

## 2021-03-05 LAB — BPAM RBC
Blood Product Expiration Date: 202210312359
Blood Product Expiration Date: 202210312359
Blood Product Expiration Date: 202210312359
Blood Product Expiration Date: 202211022359
ISSUE DATE / TIME: 202209301520
ISSUE DATE / TIME: 202210010322
ISSUE DATE / TIME: 202210010609
ISSUE DATE / TIME: 202210030458
Unit Type and Rh: 5100
Unit Type and Rh: 5100
Unit Type and Rh: 5100
Unit Type and Rh: 5100

## 2021-03-05 LAB — CBC WITH DIFFERENTIAL/PLATELET
Abs Immature Granulocytes: 0.05 10*3/uL (ref 0.00–0.07)
Basophils Absolute: 0 10*3/uL (ref 0.0–0.1)
Basophils Relative: 0 %
Eosinophils Absolute: 0.5 10*3/uL (ref 0.0–0.5)
Eosinophils Relative: 7 %
HCT: 26.5 % — ABNORMAL LOW (ref 36.0–46.0)
Hemoglobin: 8.3 g/dL — ABNORMAL LOW (ref 12.0–15.0)
Immature Granulocytes: 1 %
Lymphocytes Relative: 8 %
Lymphs Abs: 0.6 10*3/uL — ABNORMAL LOW (ref 0.7–4.0)
MCH: 28.1 pg (ref 26.0–34.0)
MCHC: 31.3 g/dL (ref 30.0–36.0)
MCV: 89.8 fL (ref 80.0–100.0)
Monocytes Absolute: 0.7 10*3/uL (ref 0.1–1.0)
Monocytes Relative: 10 %
Neutro Abs: 5.3 10*3/uL (ref 1.7–7.7)
Neutrophils Relative %: 74 %
Platelets: 125 10*3/uL — ABNORMAL LOW (ref 150–400)
RBC: 2.95 MIL/uL — ABNORMAL LOW (ref 3.87–5.11)
RDW: 21.2 % — ABNORMAL HIGH (ref 11.5–15.5)
WBC: 7.1 10*3/uL (ref 4.0–10.5)
nRBC: 0 % (ref 0.0–0.2)

## 2021-03-05 LAB — HEPARIN LEVEL (UNFRACTIONATED): Heparin Unfractionated: 0.41 IU/mL (ref 0.30–0.70)

## 2021-03-05 LAB — PROTIME-INR
INR: 1.7 — ABNORMAL HIGH (ref 0.8–1.2)
Prothrombin Time: 19.6 seconds — ABNORMAL HIGH (ref 11.4–15.2)

## 2021-03-05 MED ORDER — WARFARIN SODIUM 5 MG PO TABS
5.0000 mg | ORAL_TABLET | Freq: Once | ORAL | Status: DC
Start: 2021-03-05 — End: 2021-03-05

## 2021-03-05 MED ORDER — ESOMEPRAZOLE MAGNESIUM 20 MG PO CPDR: 20.0000 mg | DELAYED_RELEASE_CAPSULE | Freq: Two times a day (BID) | ORAL | 0 refills | Status: DC

## 2021-03-05 MED ORDER — WARFARIN SODIUM 5 MG PO TABS: ORAL_TABLET | ORAL | Status: DC

## 2021-03-05 MED ORDER — WARFARIN SODIUM 2.5 MG PO TABS: 2.5000 mg | ORAL_TABLET | Freq: Once | ORAL | Status: DC

## 2021-03-05 NOTE — Progress Notes (Signed)
[facility] ROUNDING NOTE   Subjective: No acute events overnight.  Patient nonbloody stool yesterday.  Tolerating regular diet. On heparin drip now, awaiting therapeutic INR prior to discharge.  Hgb 8.3 from 6.9 after 1 unit transfused early Monday morning    Objective: Vital signs in last 24 hours: Temp:  [97.7 F (36.5 C)-99.7 F (37.6 C)] 98.5 F (36.9 C) (10/04 0746) Pulse Rate:  [69-82] 69 (10/04 0746) Resp:  [13-23] 21 (10/04 0746) BP: (118-160)/(44-104) 150/62 (10/04 0746) SpO2:  [96 %-100 %] 99 % (10/04 0746) Last BM Date: 03/04/21 General: NAD Lungs: CTA b/l, no w/r/r Heart:  RRR, mechanical click Abdomen:  Soft, NT, ND, +BS Ext:  No c/c/e    Intake/Output from previous day: 10/03 0701 - 10/04 0700 In: 485 [P.O.:120; Blood:365] Out: -  Intake/Output this shift: No intake/output data recorded.   Lab Results: Recent Labs    03/03/21 0157 03/04/21 0119 03/04/21 1006 03/05/21 0120  WBC 9.6 7.5  --  7.1  HGB 7.1* 6.9* 8.9* 8.3*  PLT 121* 144*  --  125*  MCV 87.6 89.9  --  89.8   BMET Recent Labs    03/02/21 1113 03/03/21 0157 03/04/21 0119  NA 136 136 135  K 4.5 4.1 3.7  CL 107 107 106  CO2 23 24 23   GLUCOSE 111* 94 90  BUN 32* 23 13  CREATININE 1.32* 1.24* 1.21*  CALCIUM 7.7* 7.7* 7.9*   LFT Recent Labs    03/03/21 0157 03/04/21 0119  PROT 4.1* 4.5*  ALBUMIN 2.1* 2.4*  AST 26 47*  ALT 12 26  ALKPHOS 38 50  BILITOT 0.6 0.7   PT/INR Recent Labs    03/04/21 0119 03/05/21 0120  INR 1.5* 1.7*      Imaging/Other results: No results found.    Assessment and Plan:  80 yo patient with GI hx with Dr. Henrene Pastor.  Benign esophageal stricture and diverticulosis.  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   Bleeding appears resolved.  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  - Please reconsult if signs of rebleeding - Patient can follow up with Dr. Henrene Pastor (primary GI) as needed   Daryel November, MD 03/05/2021, 8:50 AM [facility]

## 2021-03-05 NOTE — Care Management Important Message (Signed)
Important Message  Patient Details  Name: Mckynleigh Mussell MRN: [id number] Date of Birth: 1941/03/09   Medicare Important Message Given:  Yes     Shelda Altes 03/05/2021, 8:33 AM

## 2021-03-05 NOTE — Progress Notes (Signed)
[place] for warfarin with IV heparin bridging Indication:  mechanical mitral valve replacement  Allergies  Allergen Reactions   Ace Inhibitors Cough   Lipitor [Atorvastatin Calcium] Other (See Comments)    Knee pain    Aspirin Other (See Comments)    Nephrologist said to not take this   Clorazepate Dipotassium Other (See Comments)    Interacts with a drug being taken   Simvastatin Other (See Comments)    Muscle pain   Tiotropium Bromide Monohydrate Other (See Comments)    Dry mouth   Codeine Nausea And Vomiting    Patient Measurements: Height: 5' 2"  (157.5 cm) Weight: 48.4 kg (106 lb 12.8 oz) IBW/kg (Calculated) : 50.1 Heparin Dosing Weight: 48.4 kg  Vital Signs: Temp: 98.6 F (37 C) (10/04 0320) Temp Source: Oral (10/04 0320) BP: 145/61 (10/04 0320) Pulse Rate: 80 (10/04 0320)  Labs: Recent Labs    03/02/21 1113 03/03/21 0157 03/03/21 1038 03/03/21 2007 03/04/21 0119 03/04/21 1006 03/04/21 1941 03/05/21 0120  HGB 8.0* 7.1*  --   --  6.9* 8.9*  --  8.3*  HCT 24.0* 21.1*  --   --  20.5* 27.8*  --  26.5*  PLT 125* 121*  --   --  144*  --   --  125*  LABPROT 17.8*  --  16.3*  --  17.9*  --   --  19.6*  INR 1.5*  --  1.3*  --  1.5*  --   --  1.7*  HEPARINUNFRC  --   --   --    < > 0.18* 0.17* 0.52 0.41  CREATININE 1.32* 1.24*  --   --  1.21*  --   --   --    < > = values in this interval not displayed.     Estimated Creatinine Clearance: 29.3 mL/min (A) (by C-G formula based on SCr of 1.21 mg/dL (H)).   Medical History: Past Medical History:  Diagnosis Date   Allergy    seasonal   Anemia    Anxiety    Arthritis    Asthma    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 Protecta   Biventricular ICD (implantable cardioverter-defibrillator) in place    with CRT   Blood transfusion without reported diagnosis    Bursitis    Cataract    RIGHT EYE   CHF (congestive heart failure) ([place])     Chronic kidney disease    Colon polyp    adenomatous   Complication of anesthesia    patient stated that had difficulty getting the breathing tube removed, patient said that she stopped breathing and HR dropped to 10  patient then woke up and started breathing pateint stated no longer than one minute; re-intubated in PACU following cholecystectomy 10/28/13   COPD (chronic obstructive pulmonary disease) ([place])    Depression    Diverticulosis    Dysrhythmia    Fibromyalgia    GERD (gastroesophageal reflux disease)    Gout    H/O mitral valve replacement 2002, 2007   Heart murmur    Hemorrhoids    Hyperlipidemia    Hypertension    Hypothyroidism    IBS (irritable bowel syndrome)    MVP (mitral valve prolapse)    Neuromuscular disorder (HCC)    fibromyalgia   Pacemaker    Peptic ulcer disease     Assessment: 80 yo female admitted for acute upper GI bleed. She was on PTA warfarin 5  mg daily except 2.5 mg on Monday and Friday. INR was 8 on admission. Vitamin K  IV 45m given on 9/30. Patient had injection clips and argon plasma coagulation and pharmacy has been consulted to start warfarin and bridge with heparin  -heparin level = 0.17 on 800 units/hr -hg= 8.3(5.0 on 9/30) -INR= 1.7  Goal of Therapy:  INR 2- 2.5 Heparin level 0.3-0.5 units/ml Monitor platelets by anticoagulation protocol: Yes   Plan:  Continue heparin 900 units/hr Warfarin 2.5 mg x1 Heparin level daily wth CBC daily Daily INR    Thank you for including pharmacy in the care of this patient.  AHildred Laser PharmD Clinical Pharmacist **Pharmacist phone directory can now be found on [email or web address] ([email or web address]).  Listed under [place]

## 2021-03-05 NOTE — Progress Notes (Signed)
Patient given discharge instructions and stated understanding.  Left via wheelchair with her husband driving.

## 2021-03-05 NOTE — Discharge Summary (Signed)
Physician Discharge Summary  [name] YTK:[id number] DOB: 05-12-1941 DOA: 03/01/2021  PCP: Leanna Battles, MD  Admit date: 03/01/2021 Discharge date: 03/05/2021  Time spent: 35 minutes  Recommendations for Outpatient Follow-up:  Need INR in about 2-3 days--see dosing changes of coumadin Increased omeprazole to bid this admit Get cmet and cbc in 1 week post d/c Recommend TSH in about 3 weeks  Discharge Diagnoses:  MAIN problem for hospitalization   Upper GI bleed  Please see below for itemized issues addressed in [place]- refer to other progress notes for clarity if needed  Discharge Condition: Improved  Diet recommendation: Heart healthy vitamin K controlled  Filed Weights   03/02/21 0303  Weight: 48.4 kg    History of present illness:  80 year old community dwelling white female [place] mitral valve replacement 2002 with several placement,  NYHA class II-III systolic diastolic failure with chronic left bundle-last EF 45-50% with LVH, prior shoulder replacement, benign esophageal strictures and prior diverticulosis Recent treatment for pneumonia with antibiotics not on NSAIDs Developed multiple episodes of rectal bleeding 03/02/2019 2 AM mild abdominal discomfort Hemoglobin down from 8.9-->>5, INR supratherapeutic at 8 In ED given vitamin K  Transfused 4 units of PRBC Gastroenterology consulted for input and patient underwent scope as below  Hospital Course:  Acute upper GI bleed Scope 03/02/2021 showed angiectasia and patient underwent injection clips and argon plasma coagulation Diet graduated as per GI to a regular diet on discharge Monitor closely although no overt concerns for bleeding--patient monitored for 2 days as initiated heparin/Coumadin and no overt bleeding twice daily PPI on discharge which is a change from prior Anemia of acute blood loss, normocytic anemia Iron levels are adequate-transfused 5 units PRBC total this admission Hemoglobin is  stabilized in the 8 range transfusion threshold of 7-she will need outpatient labs in a week Coagulopathy in the setting of Coumadin use Patient given vitamin K INR 1.5 [name] mechanical mitral valve repair 2002 Requires anticoagulation-patient was bridged from heparin to Coumadin I had a long discussion with her and her husband about options-she is comfortable dosing her Coumadin dose at home and when it goes down she is often told to double up-I feel she is reliable enough to double up the dose to 5 mg daily until 10/7 and get an INR check sometime in the interim Her goal should be 2.5-3.5 I have given her strict return precautions if she bleeds and she is aware of them NYHA class III systolic diastolic heart failure-last EF 45-50% (no acute component) Aldactone 25 Lasix 40  held initially but resumed on discharge  Resumed Toprol-XL 37.5  Depression Resume Xanax 0.25 sleep, Wellbutrin 150 every morning Hypothyroid Resume Synthroid 50 every morning    Discharge Exam: Vitals:   03/05/21 0746 03/05/21 0925  BP: (!) 150/62 (!) 158/63  Pulse: 69 72  Resp: (!) 21 (!) 28  Temp: 98.5 F (36.9 C) 98.6 F (37 C)  SpO2: 99% 99%    Subj on day of d/c   Awake coherent alert pleasant no dark stool no tarry stool eating drinking No chest pain no other issues at this time Husband at bedside Patient eager to go home-C long discussion about  General Exam on discharge  Awake coherent pleasant EOMI NCAT X5-T7 mechanical click Chest clear no added sound no rales or rhonchi Abdomen soft no rebound no guarding No lower extremity edema Power 5/5 Mood appropriate  Discharge Instructions   Discharge Instructions     Diet - low sodium  heart healthy   Complete by: As directed    Discharge instructions   Complete by: As directed    Make sure that you look at your meds carefully-we have resumed you on Coumadin but at a higher dose of 5 mg daily on 03/08/2021 and you will need an INR  sometime later this week to ensure that you are on the right track and become therapeutic If you experience any bleeding dark stool tarry stool etc. I would recommend you present back to the emergency room We have increased also your Nexium from once to twice a day please fill the prescription and take as usual You were resumed on all of your usual blood pressure and heart meds please take them as usual Please get labs in about 1 week at your PCP office apart from your INR   Increase activity slowly   Complete by: As directed       Allergies as of 03/05/2021       Reactions   Ace Inhibitors Cough   Lipitor [atorvastatin Calcium] Other (See Comments)   Knee pain   Aspirin Other (See Comments)   Nephrologist said to not take this   Clorazepate Dipotassium Other (See Comments)   Interacts with a drug being taken   Simvastatin Other (See Comments)   Muscle pain   Tiotropium Bromide Monohydrate Other (See Comments)   Dry mouth   Codeine Nausea And Vomiting        Medication List     STOP taking these medications    amoxicillin 500 MG tablet Commonly known as: AMOXIL   enoxaparin 40 MG/0.4ML injection Commonly known as: LOVENOX       TAKE these medications    acetaminophen 650 MG CR tablet Commonly known as: TYLENOL Take 650 mg by mouth every 8 (eight) hours as needed for pain.   allopurinol 300 MG tablet Commonly known as: ZYLOPRIM Take 300 mg by mouth every evening.   ALPRAZolam 0.25 MG tablet Commonly known as: XANAX Take 0.25 mg by mouth at bedtime as needed for anxiety or sleep.   benzonatate 100 MG capsule Commonly known as: TESSALON Take 100 mg by mouth 3 (three) times daily as needed.   buPROPion 150 MG 12 hr tablet Commonly known as: WELLBUTRIN SR Take 150 mg by mouth in the morning.   esomeprazole 20 MG capsule Commonly known as: NEXIUM Take 1 capsule (20 mg total) by mouth 2 (two) times daily before a meal. What changed: when to take this    furosemide 40 MG tablet Commonly known as: LASIX TAKE 1 TABLET BY MOUTH DAILY. **NEEDS OV FOR FUTURE REFILLS** What changed: See the new instructions.   icosapent Ethyl 1 g capsule Commonly known as: VASCEPA Take 2 capsules (2 g total) by mouth 2 (two) times daily.   metoprolol succinate 25 MG 24 hr tablet Commonly known as: TOPROL-XL TAKE 1 AND 1/2 TABLETS (37.5 MG TOTAL) BY MOUTH DAILY. What changed: See the new instructions.   pravastatin 40 MG tablet Commonly known as: PRAVACHOL Take 40 mg at bedtime by mouth.   ProAir HFA 108 (90 Base) MCG/ACT inhaler Generic drug: albuterol Inhale 1-2 puffs into the lungs every 6 (six) hours as needed for wheezing.   Senna 15 MG Tabs Take 15 mg by mouth at bedtime.   spironolactone 25 MG tablet Commonly known as: ALDACTONE TAKE 1 TABLET BY MOUTH EVERY DAY What changed: when to take this   Synthroid 50 MCG tablet Generic drug: levothyroxine Take  50 mcg by mouth daily before breakfast.   traMADol 50 MG tablet Commonly known as: ULTRAM Take 50 mg by mouth 2 (two) times daily as needed (for pain).   Voltaren 1 % Gel Generic drug: diclofenac Sodium Apply 2-4 g topically in the morning and at bedtime.   warfarin 5 MG tablet Commonly known as: COUMADIN Take as directed. If you are unsure how to take this medication, talk to your nurse or doctor. Original instructions: Take this dose for 3 days and have your INR rechecked on Friday 10/7 and consult ur pharmacist for further instructions What changed:  medication strength See the new instructions.       Allergies  Allergen Reactions   Ace Inhibitors Cough   Lipitor [Atorvastatin Calcium] Other (See Comments)    Knee pain    Aspirin Other (See Comments)    Nephrologist said to not take this   Clorazepate Dipotassium Other (See Comments)    Interacts with a drug being taken   Simvastatin Other (See Comments)    Muscle pain   Tiotropium Bromide Monohydrate Other (See  Comments)    Dry mouth   Codeine Nausea And Vomiting      The results of significant diagnostics from this hospitalization (including imaging, microbiology, ancillary and laboratory) are listed below for reference.    Significant Diagnostic Studies: CT ABDOMEN PELVIS W CONTRAST  Result Date: 03/01/2021 CLINICAL DATA:  Right upper quadrant abdominal pain. Rectal bleeding. EXAM: CT ABDOMEN AND PELVIS WITH CONTRAST TECHNIQUE: Multidetector CT imaging of the abdomen and pelvis was performed using the standard protocol following bolus administration of intravenous contrast. CONTRAST:  64m OMNIPAQUE IOHEXOL 300 MG/ML  SOLN COMPARISON:  09/01/2013 FINDINGS: Lower Chest: No acute findings. Hepatobiliary: No hepatic masses identified. Prior cholecystectomy. No evidence of biliary obstruction. Pancreas:  No mass or inflammatory changes. Spleen: Within normal limits in size and appearance. Adrenals/Urinary Tract: No masses identified. Tiny sub-cm cyst noted in both kidneys. No evidence of ureteral calculi or hydronephrosis. Unremarkable unopacified urinary bladder. Stomach/Bowel: No evidence of obstruction, inflammatory process or abnormal fluid collections. Normal appendix visualized. Diverticulosis is seen mainly involving the sigmoid colon, however there is no evidence of diverticulitis. Vascular/Lymphatic: No pathologically enlarged lymph nodes. No acute vascular findings. Aortic atherosclerotic calcification noted. Reproductive: Prior hysterectomy. A benign-appearing cyst is seen in the right adnexa measuring 2.4 x 2.2 cm. No other masses identified. No evidence of ascites. Other:  None. Musculoskeletal:  No suspicious bone lesions identified. IMPRESSION: Colonic diverticulosis, without radiographic evidence of diverticulitis or other acute findings. 2.4 cm right adnexal simple-appearing cyst. No follow-up imaging is recommended. Reference: JACR 2020 Feb;17(2):248-254 Aortic Atherosclerosis (ICD10-I70.0).  Electronically Signed   By: JMarlaine HindM.D.   On: 03/01/2021 18:55   CUP PACEART REMOTE DEVICE CHECK  Result Date: 02/19/2021 Scheduled remote reviewed. Normal device function.  Optivol peaked, secondary indicator (thoracic impedance) normalizing and almost back to baseline Next remote 91 days- JBox, RN/CVRS   Microbiology: Recent Results (from the past 240 hour(s))  SARS CORONAVIRUS 2 (TAT 6-24 HRS) Nasopharyngeal Nasopharyngeal Swab     Status: None   Collection Time: 03/01/21  6:39 PM   Specimen: Nasopharyngeal Swab  Result Value Ref Range Status   SARS Coronavirus 2 NEGATIVE NEGATIVE Final    Comment: (NOTE) SARS-CoV-2 target nucleic acids are NOT DETECTED.  The SARS-CoV-2 RNA is generally detectable in upper and lower respiratory specimens during the acute phase of infection. Negative results do not preclude SARS-CoV-2 infection, do not rule out  co-infections with other pathogens, and should not be used as the sole basis for treatment or other patient management decisions. Negative results must be combined with clinical observations, patient history, and epidemiological information. The expected result is Negative.  Fact Sheet for Patients: SugarRoll.be  Fact Sheet for Healthcare Providers: https://www.woods-mathews.com/  This test is not yet approved or cleared by the [place] FDA and  has been authorized for detection and/or diagnosis of SARS-CoV-2 by FDA under an Emergency Use Authorization (EUA). This EUA will remain  in effect (meaning this test can be used) for the duration of the COVID-19 declaration under Se ction 564(b)(1) of the Act, 21 U.S.C. section 360bbb-3(b)(1), unless the authorization is terminated or revoked sooner.  Performed at [facility], [place] [address], [place], [place] [address]      Labs: Basic Metabolic Panel: Recent Labs  Lab 03/01/21 1140 03/02/21 1113 03/03/21 0157 03/04/21 0119   NA 137 136 136 135  K 4.9 4.5 4.1 3.7  CL 107 107 107 106  CO2 24 23 24 23   GLUCOSE 114* 111* 94 90  BUN 36* 32* 23 13  CREATININE 1.39* 1.32* 1.24* 1.21*  CALCIUM 8.5* 7.7* 7.7* 7.9*   Liver Function Tests: Recent Labs  Lab 03/01/21 1140 03/03/21 0157 03/04/21 0119  AST 24 26 47*  ALT 15 12 26   ALKPHOS 59 38 50  BILITOT 0.7 0.6 0.7  PROT 5.1* 4.1* 4.5*  ALBUMIN 2.5* 2.1* 2.4*   Recent Labs  Lab 03/01/21 1140  LIPASE 37   No results for input(s): AMMONIA in the last 168 hours. CBC: Recent Labs  Lab 03/01/21 1140 03/01/21 2151 03/02/21 1113 03/03/21 0157 03/04/21 0119 03/04/21 1006 03/05/21 0120  WBC 16.5* 9.0 9.1 9.6 7.5  --  7.1  NEUTROABS 14.7* 7.3  --  7.5 5.6  --  5.3  HGB 5.0* 6.2* 8.0* 7.1* 6.9* 8.9* 8.3*  HCT 15.8* 18.9* 24.0* 21.1* 20.5* 27.8* 26.5*  MCV 103.3* 96.9 87.6 87.6 89.9  --  89.8  PLT 200 144* 125* 121* 144*  --  125*   Cardiac Enzymes: No results for input(s): CKTOTAL, CKMB, CKMBINDEX, TROPONINI in the last 168 hours. BNP: BNP (last 3 results) No results for input(s): BNP in the last 8760 hours.  ProBNP (last 3 results) No results for input(s): PROBNP in the last 8760 hours.  CBG: No results for input(s): GLUCAP in the last 168 hours.     Signed:  Nita Sells MD   [facility] 03/05/2021, 10:00 AM

## 2021-03-06 ENCOUNTER — Ambulatory Visit: Payer: Medicare Other

## 2021-03-06 ENCOUNTER — Other Ambulatory Visit: Payer: Self-pay

## 2021-03-06 DIAGNOSIS — Z7901 Long term (current) use of anticoagulants: Secondary | ICD-10-CM

## 2021-03-06 DIAGNOSIS — Z952 Presence of prosthetic heart valve: Secondary | ICD-10-CM

## 2021-03-06 LAB — POCT INR: INR: 2 (ref 2.0–3.0)

## 2021-03-06 NOTE — Patient Instructions (Addendum)
Take 2.5 tablets tonight only and then Continue Warfarin 2 tablets daily except 1 tablet each Monday and Friday.  Repeat INR in 1 week; [phone]

## 2021-03-13 ENCOUNTER — Other Ambulatory Visit: Payer: Self-pay

## 2021-03-13 ENCOUNTER — Ambulatory Visit: Payer: Medicare Other

## 2021-03-13 DIAGNOSIS — Z7901 Long term (current) use of anticoagulants: Secondary | ICD-10-CM

## 2021-03-13 DIAGNOSIS — Z952 Presence of prosthetic heart valve: Secondary | ICD-10-CM

## 2021-03-13 LAB — POCT INR: INR: 3.5 — AB (ref 2.0–3.0)

## 2021-03-13 NOTE — Patient Instructions (Signed)
Continue Warfarin 2 tablets daily except 1 tablet each Monday and Friday.  Repeat INR in 6 weeks

## 2021-03-21 ENCOUNTER — Other Ambulatory Visit: Payer: Self-pay | Admitting: Cardiovascular Disease

## 2021-04-16 ENCOUNTER — Other Ambulatory Visit: Payer: Self-pay | Admitting: Cardiovascular Disease

## 2021-04-16 NOTE — Telephone Encounter (Signed)
Prescription refill request received for warfarin [place]: 11/30/20 [facility])  Next INR check: 04/24/21 Warfarin tablet strength: 2.47m  Appropriate dose and refill sent to requested pharmacy.

## 2021-04-19 ENCOUNTER — Other Ambulatory Visit: Payer: Self-pay | Admitting: Internal Medicine

## 2021-04-19 DIAGNOSIS — R110 Nausea: Secondary | ICD-10-CM

## 2021-04-24 ENCOUNTER — Other Ambulatory Visit: Payer: Self-pay

## 2021-04-24 ENCOUNTER — Ambulatory Visit (INDEPENDENT_AMBULATORY_CARE_PROVIDER_SITE_OTHER): Payer: Medicare Other

## 2021-04-24 DIAGNOSIS — Z7901 Long term (current) use of anticoagulants: Secondary | ICD-10-CM | POA: Diagnosis not present

## 2021-04-24 DIAGNOSIS — Z952 Presence of prosthetic heart valve: Secondary | ICD-10-CM

## 2021-04-24 LAB — POCT INR: INR: 2.9 (ref 2.0–3.0)

## 2021-04-24 NOTE — Patient Instructions (Signed)
Continue Warfarin 2 tablets daily except 1 tablet each Monday and Friday.  Repeat INR in 6 weeks

## 2021-04-30 ENCOUNTER — Ambulatory Visit
Admission: RE | Admit: 2021-04-30 | Discharge: 2021-04-30 | Disposition: A | Discharge status: Home / Self Care | Payer: Medicare Other | Source: Ambulatory Visit | Attending: Internal Medicine | Admitting: Internal Medicine

## 2021-04-30 DIAGNOSIS — R110 Nausea: Secondary | ICD-10-CM

## 2021-05-08 ENCOUNTER — Other Ambulatory Visit (HOSPITAL_COMMUNITY): Payer: Self-pay

## 2021-05-09 ENCOUNTER — Other Ambulatory Visit: Payer: Self-pay

## 2021-05-09 ENCOUNTER — Encounter (HOSPITAL_COMMUNITY)
Admission: RE | Admit: 2021-05-09 | Discharge: 2021-05-09 | Disposition: A | Discharge status: Home / Self Care | Payer: Medicare Other | Source: Ambulatory Visit | Attending: Internal Medicine | Admitting: Internal Medicine

## 2021-05-09 DIAGNOSIS — D649 Anemia, unspecified: Secondary | ICD-10-CM | POA: Diagnosis not present

## 2021-05-09 MED ORDER — SODIUM CHLORIDE 0.9 % IV SOLN
510.0000 mg | INTRAVENOUS | Status: DC
  Administered 2021-05-09: 510 mg via INTRAVENOUS
  Filled 2021-05-09: qty 510

## 2021-05-16 ENCOUNTER — Encounter (HOSPITAL_COMMUNITY): Payer: Medicare Other

## 2021-05-16 ENCOUNTER — Encounter (HOSPITAL_COMMUNITY)
Admission: RE | Admit: 2021-05-16 | Discharge: 2021-05-16 | Disposition: A | Discharge status: Home / Self Care | Payer: Medicare Other | Source: Ambulatory Visit | Attending: Hematology & Oncology | Admitting: Hematology & Oncology

## 2021-05-16 DIAGNOSIS — D649 Anemia, unspecified: Secondary | ICD-10-CM | POA: Diagnosis not present

## 2021-05-16 MED ORDER — SODIUM CHLORIDE 0.9 % IV SOLN
510.0000 mg | INTRAVENOUS | Status: DC
  Administered 2021-05-16: 510 mg via INTRAVENOUS
  Filled 2021-05-16: qty 510

## 2021-05-21 ENCOUNTER — Ambulatory Visit (INDEPENDENT_AMBULATORY_CARE_PROVIDER_SITE_OTHER): Payer: Medicare Other

## 2021-05-21 DIAGNOSIS — I5032 Chronic diastolic (congestive) heart failure: Secondary | ICD-10-CM | POA: Diagnosis not present

## 2021-05-24 LAB — CUP PACEART REMOTE DEVICE CHECK
Battery Remaining Longevity: 54 mo
Battery Voltage: 2.98 V
Brady Statistic AP VP Percent: 35.21 %
Brady Statistic AP VS Percent: 2.14 %
Brady Statistic AS VP Percent: 57.16 %
Brady Statistic AS VS Percent: 5.49 %
Brady Statistic RA Percent Paced: 36.13 %
Brady Statistic RV Percent Paced: 3.93 %
Date Time Interrogation Session: 20221223031704
HighPow Impedance: 107 Ohm
Implantable Lead Implant Date: 20080221
Implantable Lead Implant Date: 20080221
Implantable Lead Implant Date: 20080221
Implantable Lead Location: 753858
Implantable Lead Location: 753859
Implantable Lead Location: 753860
Implantable Lead Model: 4543
Implantable Lead Model: 5076
Implantable Lead Model: 7002
Implantable Lead Serial Number: 134379
Implantable Pulse Generator Implant Date: 20181128
Lead Channel Impedance Value: 304 Ohm
Lead Channel Impedance Value: 342 Ohm
Lead Channel Impedance Value: 399 Ohm
Lead Channel Impedance Value: 399 Ohm
Lead Channel Impedance Value: 456 Ohm
Lead Channel Impedance Value: 722 Ohm
Lead Channel Pacing Threshold Amplitude: 0.625 V
Lead Channel Pacing Threshold Amplitude: 0.875 V
Lead Channel Pacing Threshold Amplitude: 1.25 V
Lead Channel Pacing Threshold Pulse Width: 0.4 ms
Lead Channel Pacing Threshold Pulse Width: 0.4 ms
Lead Channel Pacing Threshold Pulse Width: 0.4 ms
Lead Channel Sensing Intrinsic Amplitude: 2.5 mV
Lead Channel Sensing Intrinsic Amplitude: 2.5 mV
Lead Channel Sensing Intrinsic Amplitude: 31.625 mV
Lead Channel Sensing Intrinsic Amplitude: 31.625 mV
Lead Channel Setting Pacing Amplitude: 1.5 V
Lead Channel Setting Pacing Amplitude: 1.5 V
Lead Channel Setting Pacing Amplitude: 2.5 V
Lead Channel Setting Pacing Pulse Width: 0.4 ms
Lead Channel Setting Pacing Pulse Width: 0.4 ms
Lead Channel Setting Sensing Sensitivity: 1.2 mV

## 2021-05-30 NOTE — Progress Notes (Signed)
Remote ICD transmission.   

## 2021-06-05 ENCOUNTER — Ambulatory Visit: Payer: Medicare Other

## 2021-06-05 ENCOUNTER — Other Ambulatory Visit: Payer: Self-pay

## 2021-06-05 DIAGNOSIS — Z952 Presence of prosthetic heart valve: Secondary | ICD-10-CM

## 2021-06-05 DIAGNOSIS — Z7901 Long term (current) use of anticoagulants: Secondary | ICD-10-CM | POA: Diagnosis not present

## 2021-06-05 LAB — POCT INR: INR: 3.9 — AB (ref 2.0–3.0)

## 2021-06-05 NOTE — Patient Instructions (Signed)
HOLD TONIGHT ONLY and then Continue Warfarin 2 tablets daily except 1 tablet each Monday and Friday.  Repeat INR in 4 weeks

## 2021-06-10 LAB — POCT INR: INR: 2.5 (ref 2.0–3.0)

## 2021-06-11 ENCOUNTER — Telehealth: Payer: Self-pay | Admitting: Cardiovascular Disease

## 2021-06-11 ENCOUNTER — Ambulatory Visit (INDEPENDENT_AMBULATORY_CARE_PROVIDER_SITE_OTHER): Payer: Medicare Other | Admitting: Cardiovascular Disease

## 2021-06-11 DIAGNOSIS — Z5181 Encounter for therapeutic drug level monitoring: Secondary | ICD-10-CM

## 2021-06-11 NOTE — Telephone Encounter (Signed)
Pt calling to report that her inr is 2.5.Marland KitchenMarland Kitchen please advise

## 2021-06-11 NOTE — Patient Instructions (Addendum)
Description   Called and spoke to pt and instructed her to continue her normal dose of warfarin 2 tablets daily except for 1 a tablet on Mondays and Fridays. Recheck INR in 3 weeks.

## 2021-06-11 NOTE — Telephone Encounter (Signed)
See anticoag encounter from today for further documentation

## 2021-07-01 ENCOUNTER — Other Ambulatory Visit: Payer: Self-pay | Admitting: Internal Medicine

## 2021-07-03 ENCOUNTER — Other Ambulatory Visit: Payer: Self-pay

## 2021-07-03 ENCOUNTER — Ambulatory Visit: Payer: Medicare Other

## 2021-07-03 DIAGNOSIS — Z952 Presence of prosthetic heart valve: Secondary | ICD-10-CM

## 2021-07-03 DIAGNOSIS — Z7901 Long term (current) use of anticoagulants: Secondary | ICD-10-CM

## 2021-07-03 LAB — POCT INR: INR: 3.9 — AB (ref 2.0–3.0)

## 2021-07-03 NOTE — Patient Instructions (Signed)
TAKE ONLY 1 TABLET TONIGHT and then continue 2 tablets daily except for 1 a tablet on Mondays and Fridays. Recheck INR in 4 weeks.

## 2021-07-05 ENCOUNTER — Telehealth: Payer: Self-pay | Admitting: Cardiovascular Disease

## 2021-07-05 MED ORDER — WARFARIN SODIUM 2.5 MG PO TABS: 2.5000 mg | ORAL_TABLET | ORAL | 1 refills | Status: DC

## 2021-07-05 NOTE — Telephone Encounter (Signed)
°*  STAT* If patient is at the pharmacy, call can be transferred to refill team.   1. Which medications need to be refilled? (please list name of each medication and dose if known)  warfarin (COUMADIN) 2.5 MG tablet  2. Which pharmacy/location (including street and city if local pharmacy) is medication to be sent to? [id number] [facility] [address]. Do they need a 30 day or 90 day supply? 90 with refills   Patient said her dosage changed since she was in the hospital in October. She is close to running out . She is worried about missing a dose

## 2021-07-31 ENCOUNTER — Ambulatory Visit: Payer: Medicare Other

## 2021-07-31 ENCOUNTER — Other Ambulatory Visit: Payer: Self-pay

## 2021-07-31 DIAGNOSIS — Z7901 Long term (current) use of anticoagulants: Secondary | ICD-10-CM

## 2021-07-31 DIAGNOSIS — Z952 Presence of prosthetic heart valve: Secondary | ICD-10-CM

## 2021-07-31 LAB — POCT INR: INR: 6.1 — AB (ref 2.0–3.0)

## 2021-07-31 NOTE — Patient Instructions (Signed)
HOLD TONIGHT AND Thursday ONLY and then decrease to 2 tablets daily except for 1 tablet on Monday, Wednesday and Friday. Recheck INR in  5 days.  Eat greens tonight. ?

## 2021-08-01 ENCOUNTER — Emergency Department (HOSPITAL_COMMUNITY): Payer: Medicare Other

## 2021-08-01 ENCOUNTER — Encounter (HOSPITAL_COMMUNITY): Payer: Self-pay

## 2021-08-01 ENCOUNTER — Emergency Department (HOSPITAL_COMMUNITY)
Admission: EM | Admit: 2021-08-01 | Discharge: 2021-08-01 | Disposition: A | Discharge status: Home / Self Care | Payer: Medicare Other | Attending: Emergency Medicine | Admitting: Emergency Medicine

## 2021-08-01 ENCOUNTER — Other Ambulatory Visit: Payer: Self-pay

## 2021-08-01 DIAGNOSIS — Z79899 Other long term (current) drug therapy: Secondary | ICD-10-CM | POA: Diagnosis not present

## 2021-08-01 DIAGNOSIS — M2550 Pain in unspecified joint: Secondary | ICD-10-CM | POA: Insufficient documentation

## 2021-08-01 DIAGNOSIS — R310 Gross hematuria: Secondary | ICD-10-CM | POA: Diagnosis not present

## 2021-08-01 DIAGNOSIS — M2540 Effusion, unspecified joint: Secondary | ICD-10-CM

## 2021-08-01 LAB — COMPREHENSIVE METABOLIC PANEL
ALT: 14 U/L (ref 0–44)
AST: 24 U/L (ref 15–41)
Albumin: 2.8 g/dL — ABNORMAL LOW (ref 3.5–5.0)
Alkaline Phosphatase: 74 U/L (ref 38–126)
Anion gap: 8 (ref 5–15)
BUN: 11 mg/dL (ref 8–23)
CO2: 26 mmol/L (ref 22–32)
Calcium: 8.8 mg/dL — ABNORMAL LOW (ref 8.9–10.3)
Chloride: 101 mmol/L (ref 98–111)
Creatinine, Ser: 1.09 mg/dL — ABNORMAL HIGH (ref 0.44–1.00)
GFR, Estimated: 52 mL/min — ABNORMAL LOW (ref >60)
Glucose, Bld: 86 mg/dL (ref 70–99)
Potassium: 4.2 mmol/L (ref 3.5–5.1)
Sodium: 135 mmol/L (ref 135–145)
Total Bilirubin: 0.3 mg/dL (ref 0.3–1.2)
Total Protein: 6 g/dL — ABNORMAL LOW (ref 6.5–8.1)

## 2021-08-01 LAB — CBC
HCT: 29.1 % — ABNORMAL LOW (ref 36.0–46.0)
Hemoglobin: 9.2 g/dL — ABNORMAL LOW (ref 12.0–15.0)
MCH: 29.8 pg (ref 26.0–34.0)
MCHC: 31.6 g/dL (ref 30.0–36.0)
MCV: 94.2 fL (ref 80.0–100.0)
Platelets: 269 10*3/uL (ref 150–400)
RBC: 3.09 MIL/uL — ABNORMAL LOW (ref 3.87–5.11)
RDW: 18 % — ABNORMAL HIGH (ref 11.5–15.5)
WBC: 7.6 10*3/uL (ref 4.0–10.5)
nRBC: 0 % (ref 0.0–0.2)

## 2021-08-01 LAB — PROTIME-INR
INR: 3.3 — ABNORMAL HIGH (ref 0.8–1.2)
Prothrombin Time: 33.5 seconds — ABNORMAL HIGH (ref 11.4–15.2)

## 2021-08-01 LAB — URIC ACID: Uric Acid, Serum: 3.7 mg/dL (ref 2.5–7.1)

## 2021-08-01 LAB — POC OCCULT BLOOD, ED: Fecal Occult Bld: NEGATIVE

## 2021-08-01 MED ORDER — TRAMADOL HCL 50 MG PO TABS
50.0000 mg | ORAL_TABLET | Freq: Once | ORAL | Status: AC
  Administered 2021-08-01: 50 mg via ORAL
  Filled 2021-08-01: qty 1

## 2021-08-01 MED ORDER — ACETAMINOPHEN 325 MG PO TABS
650.0000 mg | ORAL_TABLET | Freq: Once | ORAL | Status: AC
  Administered 2021-08-01: 650 mg via ORAL
  Filled 2021-08-01: qty 2

## 2021-08-01 NOTE — Discharge Instructions (Signed)
Please follow-up with your doctor in the next 1 to 2 days ?Called the clinic tomorrow for advice regarding your Coumadin therapy ?Return if you are having worsening pain, swelling, fever or other new symptoms ?

## 2021-08-01 NOTE — ED Provider Triage Note (Signed)
Emergency Medicine Provider Triage Evaluation Note ? ?[name] , a 81 y.o. female  was evaluated in triage.  Pt complains of joint pain. Patient reports pain in her b/l feet, ankles, knees, elbows. Took tylenol at 2000 tonight for pain with minimal improvement. Pain aggravated when she tries to touch her joints or ambulate. Uses a cane at baseline. Denies fevers, trauma or falls. Chronically anticoagulated on Coumadin; supratherapeutic at her visit yesterday. ? ?Review of Systems  ?Positive: As above ?Negative: As above ? ?Physical Exam  ?BP (!) 157/67 (BP Location: Right Arm)   Pulse 73   Temp 98 ?F (36.7 ?C)   Resp 17   SpO2 97%  ?Gen:   Awake, no distress   ?Resp:  Normal effort  ?MSK:   Moves extremities without difficulty  ?Other:  DP pulse 1+ b/l. Soft tissue edema to b/l feet; nonpitting. Extremities are warm, well perfused. ? ?Medical Decision Making  ?Medically screening exam initiated at 4:12 AM.  Appropriate orders placed.  [name] was informed that the remainder of the evaluation will be completed by another provider, this initial triage assessment does not replace that evaluation, and the importance of remaining in the ED until their evaluation is complete. ? ?Polyarthralgia  ?  ?Kathleen Breach, PA-C ?08/01/21 0370 ? ?

## 2021-08-01 NOTE — ED Triage Notes (Signed)
Reports all her joints are hurting since Sunday.  ?

## 2021-08-01 NOTE — ED Provider Notes (Signed)
[facility] EMERGENCY DEPARTMENT Provider Note   CSN: [id number] Arrival date & time: 08/01/21  0348     History  Chief Complaint  Patient presents with   Joint Pain    [name] is a 81 y.o. female.  HPI 81 year old female presents today complaining of joint pain and swelling.  Bilateral ankles and wrists.  Patient has a history of mitral valve replacement, is on chronic Coumadin, reports that she was super Perret therapeutic and has been told to hold her Coumadin.  She has been complex multiple health problems.  She denies any trauma, fever, chills.  Areas are very painful.  She denies any previous history.  She has have easy bruising of her skin.  She reports that she has had recent blood in her urine.  She reports a history of GI bleed on Coumadin, but does not report any recent dark stools or bright red blood per rectum.  She denies headache, head injury, chest pain, dyspnea, abdominal pain, nausea, vomiting, or diarrhea.  States she does intermittently have nausea and vomiting but is not having it currently.  She reports she was recently given a new medicine to prove her appetite.  She states that her doctor told her she was taking too much.  However, she thinks that she has taken decreased dose of this is causing her problems. Patient reports that she normally takes Coumadin 5 mg on Sunday Tuesday Thursday and Saturday and 2.5 mg other days.  She has been told to hold her COVID and for the past 2 days since elevated at INR of 6 on 3 1    Home Medications Prior to Admission medications   Medication Sig Start Date End Date Taking? Authorizing Provider  acetaminophen (TYLENOL) 650 MG CR tablet Take 650 mg by mouth every 8 (eight) hours as needed for pain.   Yes [provider]  allopurinol (ZYLOPRIM) 300 MG tablet Take 300 mg by mouth every evening.    Yes [provider]  ALPRAZolam (XANAX) 0.25 MG tablet Take 0.25 mg by mouth at bedtime as  needed for anxiety or sleep.    Yes [provider]  antiseptic oral rinse (BIOTENE) LIQD 15 mLs by Mouth Rinse route as needed for dry mouth.   Yes [provider]  Ascorbic Acid (VITA-C PO) Take 3 capsules by mouth daily.   Yes [provider]  buPROPion (WELLBUTRIN SR) 150 MG 12 hr tablet Take 150 mg by mouth in the morning.   Yes [provider]  esomeprazole (NEXIUM) 20 MG capsule Take 1 capsule (20 mg total) by mouth 2 (two) times daily before a meal. 03/05/21  Yes Nita Sells, MD  fluticasone (FLONASE) 50 MCG/ACT nasal spray Place 1 spray into both nostrils as needed for allergies.   Yes [provider]  furosemide (LASIX) 40 MG tablet TAKE 1 TABLET BY MOUTH DAILY. **NEEDS OV FOR FUTURE REFILLS** 03/21/21  Yes Lorretta Harp, MD  icosapent Ethyl (VASCEPA) 1 g capsule Take 2 capsules (2 g total) by mouth 2 (two) times daily. 08/30/20  Yes Hilty, Nadean Corwin, MD  metoprolol succinate (TOPROL-XL) 25 MG 24 hr tablet TAKE 1 AND 1/2 TABLETS (37.5 MG TOTAL) BY MOUTH DAILY. Patient taking differently: Take 37.5 mg by mouth daily. 09/27/20  Yes Croitoru, Mihai, MD  Multiple Vitamin (MULTIVITAMIN WITH MINERALS) TABS tablet Take 1 tablet by mouth daily.   Yes [provider]  pravastatin (PRAVACHOL) 40 MG tablet Take 40 mg at  bedtime by mouth.    Yes [provider]  PROAIR HFA 108 (90 BASE) MCG/ACT inhaler Inhale 1-2 puffs into the lungs every 6 (six) hours as needed for wheezing.  01/23/15  Yes [provider]  Sennosides (SENNA) 15 MG TABS Take 15 mg by mouth at bedtime.    Yes [provider]  spironolactone (ALDACTONE) 25 MG tablet TAKE 1 TABLET BY MOUTH EVERY DAY 07/02/21  Yes Hilty, Nadean Corwin, MD  SYNTHROID 50 MCG tablet Take 50 mcg by mouth daily before breakfast. 06/05/16  Yes [provider]  traMADol (ULTRAM) 50 MG tablet Take 50 mg by mouth 2 (two) times daily as needed (for pain).   Yes [provider]  VOLTAREN 1 % GEL Apply 2-4 g topically in the morning and at bedtime.   Yes [provider]  warfarin (COUMADIN) 2.5 MG tablet Take 1-2 tablets (2.5-5 mg total) by mouth See admin instructions. Take 5 mg by mouth after 6 PM on Sun/Tues/Wed/Thurs/Sat and 2.5 mg on Mon/Fri Patient taking differently: Take 2.5 mg by mouth See admin instructions. Take on Monday, Tuesday, and Friday 07/05/21  Yes Lorretta Harp, MD  warfarin (COUMADIN) 5 MG tablet Take this dose for 3 days and have your INR rechecked on Friday 10/7 and consult ur pharmacist for further instructions Patient taking differently: Take 5 mg by mouth See admin instructions. Take on Sunday, Tuesday, Thursday, Saturday 03/05/21  Yes Nita Sells, MD  benzonatate (TESSALON) 100 MG capsule Take 100 mg by mouth 3 (three) times daily as needed. Patient not taking: Reported on 03/01/2021 11/06/20   [provider]      Allergies    Ace inhibitors, Lipitor [atorvastatin calcium], Megace [megestrol], Aspirin, Clorazepate dipotassium, Simvastatin, Tiotropium bromide monohydrate, Codeine, and Tape    Review of Systems   Review of Systems  Constitutional:  Positive for activity change.  HENT: Negative.    Respiratory: Negative.    Cardiovascular: Negative.   Musculoskeletal:  Positive for arthralgias.  Skin:        Easy bruising  All other systems reviewed and are negative.  Physical Exam Updated Vital Signs BP (!) 128/49    Pulse 70    Temp (!) 97 F (36.1 C)    Resp (!) 25    SpO2 98%  Physical Exam Vitals and nursing note reviewed.  Constitutional:      Appearance: Normal appearance. She is normal weight.  HENT:     Head: Normocephalic.     Right Ear: External ear normal.     Left Ear: External ear normal.     Nose: Nose normal.     Mouth/Throat:     Pharynx: Oropharynx is clear.  Eyes:     Conjunctiva/sclera: Conjunctivae normal.  Cardiovascular:     Rate and Rhythm: Normal rate and  regular rhythm.     Pulses: Normal pulses.     Heart sounds: Normal heart sounds.  Pulmonary:     Effort: Pulmonary effort is normal.     Breath sounds: Normal breath sounds.  Abdominal:     General: Abdomen is flat. Bowel sounds are normal.  Musculoskeletal:        General: Swelling and tenderness present. Normal range of motion.     Cervical back: Normal range of motion.     Comments: Bilateral ankle swelling with tenderness There is no warmth or overlying redness There is some swelling of the left wrist onto the dorsum of the left hand No  swelling of patient appreciated the right wrist No other joint swelling or tenderness appreciated  Skin:    General: Skin is warm and dry.     Capillary Refill: Capillary refill takes less than 2 seconds.     Findings: Bruising present.  Neurological:     General: No focal deficit present.     Mental Status: She is alert.     Sensory: No sensory deficit.     Motor: No weakness.     Coordination: Coordination normal.  Psychiatric:        Mood and Affect: Mood normal.    ED Results / Procedures / Treatments   Labs (all labs ordered are listed, but only abnormal results are displayed) Labs Reviewed  CBC - Abnormal; Notable for the following components:      Result Value   RBC 3.09 (*)    Hemoglobin 9.2 (*)    HCT 29.1 (*)    RDW 18.0 (*)    All other components within normal limits  COMPREHENSIVE METABOLIC PANEL - Abnormal; Notable for the following components:   Creatinine, Ser 1.09 (*)    Calcium 8.8 (*)    Total Protein 6.0 (*)    Albumin 2.8 (*)    GFR, Estimated 52 (*)    All other components within normal limits  PROTIME-INR - Abnormal; Notable for the following components:   Prothrombin Time 33.5 (*)    INR 3.3 (*)    All other components within normal limits  URIC ACID  URINALYSIS, ROUTINE W REFLEX MICROSCOPIC  POC OCCULT BLOOD, ED    EKG None  Radiology DG Ankle 2 Views Left  Result Date: 08/01/2021 CLINICAL  DATA:  Pain and swelling. EXAM: LEFT ANKLE - 2 VIEW COMPARISON:  Left foot radiographs 09/14/2017 FINDINGS: The ankle mortise is symmetric and intact. Joint spaces are preserved. No acute fracture or dislocation. Probable mild soft tissue swelling. IMPRESSION: Probable mild soft tissue swelling.  No acute fracture. Electronically Signed   By: Yvonne Kendall M.D.   On: 08/01/2021 12:26   DG Ankle 2 Views Right  Result Date: 08/01/2021 CLINICAL DATA:  Pain and swelling of BILATERAL ankles EXAM: RIGHT ANKLE - 2 VIEW COMPARISON:  RIGHT foot radiographs 09/14/2017 FINDINGS: Significant soft tissue swelling RIGHT ankle. Osseous demineralization. Joint spaces preserved. No acute fracture, dislocation, or bone destruction. IMPRESSION: No acute osseous abnormalities. Electronically Signed   By: Lavonia Dana M.D.   On: 08/01/2021 12:28    Procedures Procedures    Medications Ordered in ED Medications  traMADol (ULTRAM) tablet 50 mg (has no administration in time range)  traMADol (ULTRAM) tablet 50 mg (50 mg Oral Given 08/01/21 0439)  acetaminophen (TYLENOL) tablet 650 mg (650 mg Oral Given 08/01/21 1245)    ED Course/ Medical Decision Making/ A&P Clinical Course as of 08/01/21 1543  Thu Aug 01, 2021  1255 Right ankle x-Lovey Crupi reviewed and interpreted no acute abnormalities Reviewed radiologist finding and no acute abnormalities [DR]  1258 Left ankle reviewed and interpreted with no acute abnormalities Reviewed radiologist interpretation with a CT mild soft tissue swelling [DR]  1351 CT reviewed with anemia hemoglobin 9.9 which is improved from first prior [DR]  5462 Complete metabolic panel reviewed and interpreted with creatinine 1.09 appears stable from prior Mild hypocalcemia at 8.8 Total protein and albumin decreased consistent with poor nutrition [DR]    Clinical Course User Index [DR] Pattricia Boss, MD  Medical Decision Making 81year-old female, on Coumadin, history of  gout, presents today complaining of swelling in her bilateral ankles and wrists.  She reports this has been coming on for the past several days and is painful.  Is not had fever or chills.  I have a low index of suspicion for infectious etiology.  I am concerned regarding bleeding and gout.  Recheck of her INR that was at 6 several days ago has decreased to 3.2 her hemoglobin is remained stable.  Her radiographs of her ankles obtained and no evidence of acute fracture. Differential diagnosis including infectious, autoimmune, gout, trauma, hemarthrosis with elevated inr Patient will continue home gout medications.  Uric acid level is normal No significant warmth or leukocytosis or fever Patient's INR has improved and hemoglobin has remained stable.,  Cannot rule out  arthritis however, cannot be ruled out, but would not tap given elevated INR Patient thinks that this is secondary to her Megace.  Have advised that she can hold Megace until recheck with Dr. Sharlett Iles. Have advised close LOC.  She will use walker at home. Discussed return precautions and need for follow-up and she and her husband voiced understanding.   Amount and/or Complexity of Data Reviewed Labs: ordered. Decision-making details documented in ED Course. Radiology: ordered and independent interpretation performed. Decision-making details documented in ED Course.  Risk OTC drugs. Prescription drug management.           Final Clinical Impression(s) / ED Diagnoses Final diagnoses:  Painful swelling of joint    Rx / DC Orders ED Discharge Orders     None         Pattricia Boss, MD 08/01/21 [phone]

## 2021-08-05 ENCOUNTER — Other Ambulatory Visit: Payer: Self-pay

## 2021-08-05 ENCOUNTER — Ambulatory Visit: Payer: Medicare Other

## 2021-08-05 DIAGNOSIS — Z952 Presence of prosthetic heart valve: Secondary | ICD-10-CM

## 2021-08-05 DIAGNOSIS — Z7901 Long term (current) use of anticoagulants: Secondary | ICD-10-CM | POA: Diagnosis not present

## 2021-08-05 LAB — POCT INR: INR: 2.9 (ref 2.0–3.0)

## 2021-08-05 NOTE — Patient Instructions (Signed)
Continue 2 tablets daily except for 1 tablet on Monday, Wednesday and Friday. Recheck INR in 4 weeks. ?

## 2021-08-19 ENCOUNTER — Telehealth: Payer: Self-pay | Admitting: Internal Medicine

## 2021-08-19 MED ORDER — ICOSAPENT ETHYL 1 G PO CAPS: 1.0000 g | ORAL_CAPSULE | Freq: Two times a day (BID) | ORAL | 1 refills | Status: DC

## 2021-08-19 MED ORDER — SPIRONOLACTONE 25 MG PO TABS: 25.0000 mg | ORAL_TABLET | Freq: Every day | ORAL | 3 refills | Status: DC

## 2021-08-19 NOTE — Telephone Encounter (Signed)
?*  STAT* If patient is at the pharmacy, call can be transferred to refill team. ? ? ?1. Which medications need to be refilled? (please list name of each medication and dose if known)  ?spironolactone (ALDACTONE) 25 MG tablet ? ?2. Which pharmacy/location (including street and city if local pharmacy) is medication to be sent to? [id number] [facility] [address]? ?3. Do they need a 30 day or 90 day supply? 90 day supply ? ? ?Patient has been out of medication since January  ?

## 2021-08-19 NOTE — Telephone Encounter (Signed)
Pixie Casino, MD  Caprice Beaver, LPN 3 hours ago (1:10 PM)  ? ?[place] .[name] would have her take 2/day if she can tolerate it.  ? ?Dr Lemmie Evens   ? ?

## 2021-08-19 NOTE — Telephone Encounter (Signed)
Patient called. Aware MD said OK to take 2 per day. Refill sent ?

## 2021-08-19 NOTE — Telephone Encounter (Signed)
Contacted patient, she states that anytime she takes 2 tablets in the morning, and 2 tablets in the evening- she is having episodes of upset stomach, and vomiting. She states when she only take 2 tablets all day this is better. She was advised I would notify MD, she states she will continue to try to take it if he wants her too, but she wanted to bring this to someone attention.  ? ? ?

## 2021-08-19 NOTE — Telephone Encounter (Signed)
Refill sent to pharmacy.   

## 2021-08-19 NOTE — Telephone Encounter (Signed)
Pt c/o medication issue: ? ?1. Name of Medication: icosapent Ethyl (VASCEPA) 1 g capsule ? ?2. How are you currently taking this medication (dosage and times per day)? Two a day  ? ?3. Are you having a reaction (difficulty breathing--STAT)? Yes ? ?4. What is your medication issue? If she takes 4 capsules as advised it makes her vomit. Can only take two a day. Please advise.   ?

## 2021-08-20 ENCOUNTER — Ambulatory Visit (INDEPENDENT_AMBULATORY_CARE_PROVIDER_SITE_OTHER): Payer: Medicare Other

## 2021-08-20 DIAGNOSIS — I480 Paroxysmal atrial fibrillation: Secondary | ICD-10-CM | POA: Diagnosis not present

## 2021-08-21 LAB — CUP PACEART REMOTE DEVICE CHECK
Battery Remaining Longevity: 48 mo
Battery Voltage: 2.98 V
Brady Statistic AP VP Percent: 47.81 %
Brady Statistic AP VS Percent: 2.66 %
Brady Statistic AS VP Percent: 44.19 %
Brady Statistic AS VS Percent: 5.34 %
Brady Statistic RA Percent Paced: 48.68 %
Brady Statistic RV Percent Paced: 1.26 %
Date Time Interrogation Session: 20230321044224
HighPow Impedance: 67 Ohm
Implantable Lead Implant Date: 20080221
Implantable Lead Implant Date: 20080221
Implantable Lead Implant Date: 20080221
Implantable Lead Location: 753858
Implantable Lead Location: 753859
Implantable Lead Location: 753860
Implantable Lead Model: 4543
Implantable Lead Model: 5076
Implantable Lead Model: 7002
Implantable Lead Serial Number: 134379
Implantable Pulse Generator Implant Date: 20181128
Lead Channel Impedance Value: 266 Ohm
Lead Channel Impedance Value: 323 Ohm
Lead Channel Impedance Value: 342 Ohm
Lead Channel Impedance Value: 380 Ohm
Lead Channel Impedance Value: 399 Ohm
Lead Channel Impedance Value: 627 Ohm
Lead Channel Pacing Threshold Amplitude: 0.625 V
Lead Channel Pacing Threshold Amplitude: 1.125 V
Lead Channel Pacing Threshold Amplitude: 1.125 V
Lead Channel Pacing Threshold Pulse Width: 0.4 ms
Lead Channel Pacing Threshold Pulse Width: 0.4 ms
Lead Channel Pacing Threshold Pulse Width: 0.4 ms
Lead Channel Sensing Intrinsic Amplitude: 2 mV
Lead Channel Sensing Intrinsic Amplitude: 2 mV
Lead Channel Sensing Intrinsic Amplitude: 31.625 mV
Lead Channel Sensing Intrinsic Amplitude: 31.625 mV
Lead Channel Setting Pacing Amplitude: 1.5 V
Lead Channel Setting Pacing Amplitude: 1.75 V
Lead Channel Setting Pacing Amplitude: 2.5 V
Lead Channel Setting Pacing Pulse Width: 0.4 ms
Lead Channel Setting Pacing Pulse Width: 0.4 ms
Lead Channel Setting Sensing Sensitivity: 1.2 mV

## 2021-08-28 ENCOUNTER — Other Ambulatory Visit: Payer: Self-pay | Admitting: Internal Medicine

## 2021-08-29 ENCOUNTER — Ambulatory Visit: Payer: Medicare Other | Admitting: Podiatry

## 2021-08-29 ENCOUNTER — Ambulatory Visit (INDEPENDENT_AMBULATORY_CARE_PROVIDER_SITE_OTHER): Payer: Medicare Other

## 2021-08-29 DIAGNOSIS — M79671 Pain in right foot: Secondary | ICD-10-CM

## 2021-08-29 DIAGNOSIS — M19072 Primary osteoarthritis, left ankle and foot: Secondary | ICD-10-CM

## 2021-08-29 DIAGNOSIS — G8929 Other chronic pain: Secondary | ICD-10-CM | POA: Diagnosis not present

## 2021-08-29 DIAGNOSIS — M19071 Primary osteoarthritis, right ankle and foot: Secondary | ICD-10-CM | POA: Diagnosis not present

## 2021-08-29 DIAGNOSIS — M19079 Primary osteoarthritis, unspecified ankle and foot: Secondary | ICD-10-CM

## 2021-08-29 DIAGNOSIS — M25572 Pain in left ankle and joints of left foot: Secondary | ICD-10-CM | POA: Diagnosis not present

## 2021-08-29 DIAGNOSIS — M7750 Other enthesopathy of unspecified foot: Secondary | ICD-10-CM

## 2021-08-29 DIAGNOSIS — M79672 Pain in left foot: Secondary | ICD-10-CM | POA: Diagnosis not present

## 2021-08-29 DIAGNOSIS — M25571 Pain in right ankle and joints of right foot: Secondary | ICD-10-CM | POA: Diagnosis not present

## 2021-09-02 ENCOUNTER — Other Ambulatory Visit: Payer: Self-pay | Admitting: Podiatry

## 2021-09-02 ENCOUNTER — Ambulatory Visit: Payer: Medicare Other

## 2021-09-02 DIAGNOSIS — Z7901 Long term (current) use of anticoagulants: Secondary | ICD-10-CM | POA: Diagnosis not present

## 2021-09-02 DIAGNOSIS — M19071 Primary osteoarthritis, right ankle and foot: Secondary | ICD-10-CM

## 2021-09-02 DIAGNOSIS — Z952 Presence of prosthetic heart valve: Secondary | ICD-10-CM

## 2021-09-02 LAB — POCT INR: INR: 3.9 — AB (ref 2.0–3.0)

## 2021-09-02 NOTE — Patient Instructions (Signed)
HOLD TONIGHT ONLY and then Continue 2 tablets daily except for 1 tablet on Monday, Wednesday and Friday. Recheck INR in 4 weeks. ?

## 2021-09-02 NOTE — Progress Notes (Signed)
Subjective:  ? ?Patient ID: [name], female   DOB: 81 y.o.   MRN: [id number]  ? ?HPI ?81 year old female presents to the office today for concerns of bilateral foot pain.  She has been out of her foot and ankle.  She is tried over-the-counter medication, icing, heat, Voltaren gel with significant improvement.  She has a history of gout many years ago.  I last saw the patient in 2019 for bilateral foot arthritis.  She states the symptoms have continued to worsen and ask for other treatment options. ? ? ?Review of Systems  ?All other systems reviewed and are negative. ? ?Past Medical History:  ?Diagnosis Date  ? Allergy   ? seasonal  ? Anemia   ? Anxiety   ? Arthritis   ? Asthma   ? Automatic implantable cardioverter-defibrillator in situ   ? Medtronic Protecta  ? Biventricular ICD (implantable cardioverter-defibrillator) in place   ? with CRT  ? Blood transfusion without reported diagnosis   ? Bursitis   ? Cataract   ? RIGHT EYE  ? CHF (congestive heart failure) ([place])   ? Chronic kidney disease   ? Colon polyp   ? adenomatous  ? Complication of anesthesia   ? patient stated that had difficulty getting the breathing tube removed, patient said that she stopped breathing and HR dropped to 10  patient then woke up and started breathing pateint stated no longer than one minute; re-intubated in PACU following cholecystectomy 10/28/13  ? COPD (chronic obstructive pulmonary disease) ([place])   ? Depression   ? Diverticulosis   ? Dysrhythmia   ? Fibromyalgia   ? GERD (gastroesophageal reflux disease)   ? Gout   ? H/O mitral valve replacement 2002, 2007  ? Heart murmur   ? Hemorrhoids   ? Hyperlipidemia   ? Hypertension   ? Hypothyroidism   ? IBS (irritable bowel syndrome)   ? MVP (mitral valve prolapse)   ? Neuromuscular disorder ([place])   ? fibromyalgia  ? Pacemaker   ? Peptic ulcer disease   ? ? ?Past Surgical History:  ?Procedure Laterality Date  ? ABDOMINAL HYSTERECTOMY    ? BIV ICD GENERATOR CHANGEOUT N/A 04/29/2017   ? Procedure: BIV ICD GENERATOR CHANGEOUT;  Surgeon: Sanda Klein, MD;  Location: [facility];  Service: Cardiovascular;  Laterality: N/A;  ? CARDIAC CATHETERIZATION  02/25/2006  ? normal left main, normal LAD, normal L Cfx, normal/dominant RCA (Dr. [name])  ? CARDIAC DEFIBRILLATOR PLACEMENT  2007, 11/202012  ? x2 (pacemaker) (Dr. Jerilynn Mages. Croitoru)  ? CARDIAC VALVE REPLACEMENT  2002  ? MV repair - Dr. Berle Mull  ? CHOLECYSTECTOMY N/A 10/28/2013  ? Procedure: LAPAROSCOPIC CHOLECYSTECTOMY WITH INTRAOPERATIVE CHOLANGIOGRAM;  Surgeon: Edward Jolly, MD;  Location: [facility];  Service: General;  Laterality: N/A;  ? COLONOSCOPY    ? ESOPHAGOGASTRODUODENOSCOPY (EGD) WITH PROPOFOL N/A 03/02/2021  ? Procedure: ESOPHAGOGASTRODUODENOSCOPY (EGD) WITH PROPOFOL;  Surgeon: Daryel November, MD;  Location: [facility];  Service: Gastroenterology;  Laterality: N/A;  ? EYE SURGERY    ? HEMOSTASIS CLIP PLACEMENT  03/02/2021  ? Procedure: HEMOSTASIS CLIP PLACEMENT;  Surgeon: Daryel November, MD;  Location: [facility];  Service: Gastroenterology;;  ? HOT HEMOSTASIS  03/02/2021  ? Procedure: HOT HEMOSTASIS (ARGON PLASMA COAGULATION/BICAP);  Surgeon: Daryel November, MD;  Location: [facility]OS[facility];  Service: Gastroenterology;;  ? IMPLANTABLE CARDIOVERTER DEFIBRILLATOR (ICD) GENERATOR CHANGE N/A 04/10/2011  ? Procedure: ICD GENERATOR CHANGE;  Surgeon: Sanda Klein, MD;  Location: [facility]  CATH LAB;  Service: Cardiovascular;  Laterality: N/A;  ? INSERT / REPLACE / REMOVE PACEMAKER    ? KNEE ARTHROSCOPY    ? MITRAL VALVE REPLACEMENT  02/26/2006  ? re-do MVR w/29m [employer] [name] (Dr. BEllison Hughs  ? NM MYOCAR PERF WALL MOTION  2005  ? persantine myoview - low ris, EF 63%  ? RIGHT HEART CATH  04/03/2006  ? pulm cap wedge pressure 24/24, PA pressure 43/22 (mean 326mg), CO 4.8, CI 4.1 (Dr. J.Jackie Plum ? SCLEROTHERAPY  03/02/2021  ? Procedure: SCLEROTHERAPY;  Surgeon: CuDaryel NovemberMD;  [facility];  Service:  Gastroenterology;;  ? TOTAL SHOULDER ARTHROPLASTY Right 12/27/2015  ? Procedure: RIGHT TOTAL SHOULDER ARTHROPLASTY;  Surgeon: JuTania AdeMD;  [facility] Service: Orthopedics;  Laterality: Right;  Right total shoulder arthroplasty  ? TOTAL SHOULDER ARTHROPLASTY Left 10/27/2019  ? Procedure: REVERSE TOTAL SHOULDER ARTHROPLASTY;  Surgeon: ChTania AdeMD;  Location: [facility];  Service: Orthopedics;  Laterality: Left;  ? TRANSTHORACIC ECHOCARDIOGRAM  12/2011  ? EF 50-55%, mild global hypokinesis; LA severely dilated; calcification of anterior/posterior MV leaflets, bi-leaflets St. [name] mechanical MV; mild TR; trace AV regurg/pulm valve regurg  ? ? ? ?Current Outpatient Medications:  ?  icosapent Ethyl (VASCEPA) 1 g capsule, TAKE 2 CAPSULES BY MOUTH 2 TIMES DAILY., Disp: 120 capsule, Rfl: 11 ?  acetaminophen (TYLENOL) 650 MG CR tablet, Take 650 mg by mouth every 8 (eight) hours as needed for pain., Disp: , Rfl:  ?  allopurinol (ZYLOPRIM) 300 MG tablet, Take 300 mg by mouth every evening. , Disp: , Rfl:  ?  ALPRAZolam (XANAX) 0.25 MG tablet, Take 0.25 mg by mouth at bedtime as needed for anxiety or sleep. , Disp: , Rfl:  ?  antiseptic oral rinse (BIOTENE) LIQD, 15 mLs by Mouth Rinse route as needed for dry mouth., Disp: , Rfl:  ?  Ascorbic Acid (VITA-C PO), Take 3 capsules by mouth daily., Disp: , Rfl:  ?  benzonatate (TESSALON) 100 MG capsule, Take 100 mg by mouth 3 (three) times daily as needed. (Patient not taking: Reported on 03/01/2021), Disp: , Rfl:  ?  buPROPion (WELLBUTRIN SR) 150 MG 12 hr tablet, Take 150 mg by mouth in the morning., Disp: , Rfl:  ?  esomeprazole (NEXIUM) 20 MG capsule, Take 1 capsule (20 mg total) by mouth 2 (two) times daily before a meal., Disp: 60 capsule, Rfl: 0 ?  fluticasone (FLONASE) 50 MCG/ACT nasal spray, Place 1 spray into both nostrils as needed for allergies., Disp: , Rfl:  ?  furosemide (LASIX) 40 MG tablet, TAKE 1 TABLET BY MOUTH DAILY. **NEEDS OV FOR FUTURE REFILLS**,  Disp: 90 tablet, Rfl: 2 ?  metoprolol succinate (TOPROL-XL) 25 MG 24 hr tablet, TAKE 1 AND 1/2 TABLETS (37.5 MG TOTAL) BY MOUTH DAILY. (Patient taking differently: Take 37.5 mg by mouth daily.), Disp: 135 tablet, Rfl: 3 ?  Multiple Vitamin (MULTIVITAMIN WITH MINERALS) TABS tablet, Take 1 tablet by mouth daily., Disp: , Rfl:  ?  pravastatin (PRAVACHOL) 40 MG tablet, Take 40 mg at bedtime by mouth. , Disp: , Rfl:  ?  PROAIR HFA 108 (90 BASE) MCG/ACT inhaler, Inhale 1-2 puffs into the lungs every 6 (six) hours as needed for wheezing. , Disp: , Rfl:  ?  Sennosides (SENNA) 15 MG TABS, Take 15 mg by mouth at bedtime. , Disp: , Rfl:  ?  spironolactone (ALDACTONE) 25 MG tablet, Take 1 tablet (25 mg total) by mouth daily., Disp:  90 tablet, Rfl: 3 ?  SYNTHROID 50 MCG tablet, Take 50 mcg by mouth daily before breakfast., Disp: , Rfl: 2 ?  traMADol (ULTRAM) 50 MG tablet, Take 50 mg by mouth 2 (two) times daily as needed (for pain)., Disp: , Rfl:  ?  VOLTAREN 1 % GEL, Apply 2-4 g topically in the morning and at bedtime., Disp: , Rfl:  ?  warfarin (COUMADIN) 2.5 MG tablet, Take 1-2 tablets (2.5-5 mg total) by mouth See admin instructions. Take 5 mg by mouth after 6 PM on Sun/Tues/Wed/Thurs/Sat and 2.5 mg on Mon/Fri (Patient taking differently: Take 2.5 mg by mouth See admin instructions. Take on Monday, Tuesday, and Friday), Disp: 135 tablet, Rfl: 1 ?  warfarin (COUMADIN) 5 MG tablet, Take this dose for 3 days and have your INR rechecked on Friday 10/7 and consult ur pharmacist for further instructions (Patient taking differently: Take 5 mg by mouth See admin instructions. Take on Sunday, Tuesday, Thursday, Saturday), Disp: , Rfl:  ? ?Current Facility-Administered Medications:  ?  0.9 %  sodium chloride infusion, 500 mL, Intravenous, Continuous, Irene Shipper, MD ? ?Allergies  ?Allergen Reactions  ? Ace Inhibitors Cough  ? Lipitor [Atorvastatin Calcium] Other (See Comments)  ?  Knee pain ?  ? Megace [Megestrol] Other (See  Comments)  ?  High blood pressure and blurred vision  ? Aspirin Other (See Comments)  ?  Nephrologist said to not take this  ? Clorazepate Dipotassium Other (See Comments)  ?  Interacts with a drug being taken  ? Simvast

## 2021-09-04 NOTE — Progress Notes (Signed)
Remote ICD transmission.   

## 2021-09-30 ENCOUNTER — Ambulatory Visit: Payer: Medicare Other

## 2021-09-30 DIAGNOSIS — Z7901 Long term (current) use of anticoagulants: Secondary | ICD-10-CM | POA: Diagnosis not present

## 2021-09-30 DIAGNOSIS — Z952 Presence of prosthetic heart valve: Secondary | ICD-10-CM

## 2021-09-30 LAB — POCT INR: INR: 6 — AB (ref 2.0–3.0)

## 2021-09-30 NOTE — Patient Instructions (Signed)
HOLD TONIGHT and TUESDAY ONLY and then Continue 2 tablets daily except for 1 tablet on Monday, Wednesday and Friday. Recheck INR Friday ?

## 2021-10-04 ENCOUNTER — Ambulatory Visit: Payer: Medicare Other

## 2021-10-04 DIAGNOSIS — Z952 Presence of prosthetic heart valve: Secondary | ICD-10-CM

## 2021-10-04 DIAGNOSIS — Z7901 Long term (current) use of anticoagulants: Secondary | ICD-10-CM

## 2021-10-04 LAB — POCT INR: INR: 3 (ref 2.0–3.0)

## 2021-10-04 NOTE — Patient Instructions (Signed)
Continue 2 tablets daily except for 1 tablet on Monday, Wednesday and Friday. INR  in 6 weeks ?

## 2021-10-09 ENCOUNTER — Ambulatory Visit: Payer: Medicare Other | Admitting: Cardiovascular Disease

## 2021-10-09 ENCOUNTER — Encounter: Payer: Self-pay | Admitting: Cardiovascular Disease

## 2021-10-09 VITALS — BP 120/60 | HR 70 | Ht 62.0 in | Wt 103.8 lb

## 2021-10-09 DIAGNOSIS — Z95810 Presence of automatic (implantable) cardiac defibrillator: Secondary | ICD-10-CM

## 2021-10-09 DIAGNOSIS — Z952 Presence of prosthetic heart valve: Secondary | ICD-10-CM

## 2021-10-09 DIAGNOSIS — I10 Essential (primary) hypertension: Secondary | ICD-10-CM

## 2021-10-09 DIAGNOSIS — I5032 Chronic diastolic (congestive) heart failure: Secondary | ICD-10-CM | POA: Diagnosis not present

## 2021-10-09 DIAGNOSIS — I480 Paroxysmal atrial fibrillation: Secondary | ICD-10-CM

## 2021-10-09 NOTE — Assessment & Plan Note (Signed)
History of essential hypertension blood pressure measured today at 120/60.  She is on metoprolol. ?

## 2021-10-09 NOTE — Assessment & Plan Note (Signed)
History of mitral valve repair by by Dr. 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 Cyndia Bent in 2007.  Her most recent echo performed 08/16/2020 revealing a well-functioning functioning mitral mechanical prosthesis without evidence of MR.  She is on Coumadin anticoagulation followed in our Coumadin clinic. ?

## 2021-10-09 NOTE — Assessment & Plan Note (Signed)
History of PAF maintaining sinus rhythm on Coumadin oral anticoagulation. ?

## 2021-10-09 NOTE — Assessment & Plan Note (Signed)
History of diastolic dysfunction on furosemide. ?

## 2021-10-09 NOTE — Assessment & Plan Note (Signed)
History of ICD implantation by Dr. Sallyanne Kuster for resynchronization therapy 04/12/2011.  Her initial EF was 10% which resulted in almost normalization of her LV function in the 45 to 50% range.  She is completely asymptomatic. ?

## 2021-10-09 NOTE — Assessment & Plan Note (Signed)
History of hyperlipidemia on statin therapy with lipid profile performed 01/04/2021 revealing total cholesterol of 229, LDL 138 and HDL of 37.  She does have normal coronary arteries angiographically. ?

## 2021-10-09 NOTE — Patient Instructions (Signed)
Medication Instructions:  ?Your physician recommends that you continue on your current medications as directed. Please refer to the Current Medication list given to you today. ? ?*If you need a refill on your cardiac medications before your next appointment, please call your pharmacy* ? ? ? ?Testing/Procedures: ?Your physician has requested that you have an echocardiogram. Echocardiography is a painless test that uses sound waves to create images of your heart. It provides your doctor with information about the size and shape of your heart and how well your heart?s chambers and valves are working. This procedure takes approximately one hour. There are no restrictions for this procedure. This procedure will be done at [address] ? ? ? ?Follow-Up: ?At [facility], you and your health needs are our priority.  As part of our continuing mission to provide you with exceptional heart care, we have created designated Provider Care Teams.  These Care Teams include your primary Cardiologist (physician) and Advanced Practice Providers (APPs -  Physician Assistants and Nurse Practitioners) who all work together to provide you with the care you need, when you need it. ? ?We recommend signing up for the patient portal called "MyChart".  Sign up information is provided on this After Visit Summary.  MyChart is used to connect with patients for Virtual Visits (Telemedicine).  Patients are able to view lab/test results, encounter notes, upcoming appointments, etc.  Non-urgent messages can be sent to your provider as well.   ?To learn more about what you can do with MyChart, go to [email or web address].   ? ?Your next appointment:   ?12 month(s) ? ?The format for your next appointment:   ?In Person ? ?Provider:   ?Quay Burow, MD  ?

## 2021-10-09 NOTE — Progress Notes (Signed)
? ? ? ?10/09/2021 ?[name]   ?Jul 24, 1940  ?[id number] ? ?Primary Physician [name], MD ?Primary Cardiologist: Lorretta Harp MD Lupe Carney, [place] ? ?HPI:  [name] is a 81 y.o.  thin appearing, married, Caucasian female, mother of 4(2 deceased sons), grandmother to eight grandchildren, who I last saw in the office 07/31/2020.[name] Her husband [name] is also a patient of mine and accompanies her today.  He had stage IV lung cancer, which responded to chemotherapy. 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 Berle Mull in 2002 with redo with a St. [name] MVR performed by Dr. Gilford Raid in 2007. She had normal coronary arteries. Her EF improved initially from 10% to normal with cardiac resynchronization therapy. Dr. Sallyanne Kuster follows this. She has undergone a generator change by Dr. Sallyanne Kuster on April 22, 2011, and she is followed by [facility].  Recent 2-D echocardiogram performed 02/07/16 revealed normal ejection fraction with a normal functioning mitral mechanical prosthesis. She denies chest pain or shortness of breath. ?  ?She had a bi-V generator change by Dr. Sallyanne Kuster 04/28/17 after having a Lovenox bridge for her mitral valve. She is otherwise asymptomatic with her most recent 2-D echo performed 08/05/2017 revealing an EF of 50 to 55% with a well-functioning mitral mechanical prosthesis. ?  ?Since I saw her a year ago she is remained stable.  She denies chest pain or shortness of breath.  Her major complaints are of arthritic pain and also swelling in her hands and feet on occasion.  She is on oral diuretic.  Her most recent 2D echo performed 08/16/2020 was unchanged from the prior year with a well-functioning mitral mechanical prosthesis. ? ?Current Meds  ?Medication Sig  ? acetaminophen (TYLENOL) 650 MG CR tablet Take 650 mg by mouth every 8 (eight) hours as needed for pain.  ? allopurinol (ZYLOPRIM) 300 MG tablet Take 300 mg  by mouth every evening.   ? ALPRAZolam (XANAX) 0.25 MG tablet Take 0.25 mg by mouth at bedtime as needed for anxiety or sleep.   ? antiseptic oral rinse (BIOTENE) LIQD 15 mLs by Mouth Rinse route as needed for dry mouth.  ? Ascorbic Acid (VITA-C PO) Take 3 capsules by mouth daily.  ? buPROPion (WELLBUTRIN SR) 150 MG 12 hr tablet Take 150 mg by mouth in the morning.  ? esomeprazole (NEXIUM) 20 MG capsule Take 1 capsule (20 mg total) by mouth 2 (two) times daily before a meal.  ? fluticasone (FLONASE) 50 MCG/ACT nasal spray Place 1 spray into both nostrils as needed for allergies.  ? furosemide (LASIX) 40 MG tablet TAKE 1 TABLET BY MOUTH DAILY. **NEEDS OV FOR FUTURE REFILLS**  ? icosapent Ethyl (VASCEPA) 1 g capsule TAKE 2 CAPSULES BY MOUTH 2 TIMES DAILY.  ? metoprolol succinate (TOPROL-XL) 25 MG 24 hr tablet TAKE 1 AND 1/2 TABLETS (37.5 MG TOTAL) BY MOUTH DAILY. (Patient taking differently: Take 37.5 mg by mouth daily.)  ? Multiple Vitamin (MULTIVITAMIN WITH MINERALS) TABS tablet Take 1 tablet by mouth daily.  ? pravastatin (PRAVACHOL) 40 MG tablet Take 40 mg at bedtime by mouth.   ? PROAIR HFA 108 (90 BASE) MCG/ACT inhaler Inhale 1-2 puffs into the lungs every 6 (six) hours as needed for wheezing.   ? Sennosides (SENNA) 15 MG TABS Take 15 mg by mouth at bedtime.   ? spironolactone (ALDACTONE) 25 MG tablet Take 1 tablet (25 mg total) by mouth daily.  ?  SYNTHROID 50 MCG tablet Take 50 mcg by mouth daily before breakfast.  ? VOLTAREN 1 % GEL Apply 2-4 g topically in the morning and at bedtime.  ? warfarin (COUMADIN) 2.5 MG tablet Take 1-2 tablets (2.5-5 mg total) by mouth See admin instructions. Take 5 mg by mouth after 6 PM on Sun/Tues/Wed/Thurs/Sat and 2.5 mg on Mon/Fri (Patient taking differently: Take 2.5 mg by mouth See admin instructions. Take on Monday, Tuesday, and Friday)  ? warfarin (COUMADIN) 5 MG tablet Take this dose for 3 days and have your INR rechecked on Friday 10/7 and consult ur pharmacist for  further instructions (Patient taking differently: Take 5 mg by mouth See admin instructions. Take on Sunday, Tuesday, Thursday, Saturday)  ? ?Current Facility-Administered Medications for the 10/09/21 encounter (Office Visit) with Lorretta Harp, MD  ?Medication  ? 0.9 %  sodium chloride infusion  ?  ? ?Allergies  ?Allergen Reactions  ? Ace Inhibitors Cough  ? Lipitor [Atorvastatin Calcium] Other (See Comments)  ?  Knee pain ?  ? Megace [Megestrol] Other (See Comments)  ?  High blood pressure and blurred vision  ? Aspirin Other (See Comments)  ?  Nephrologist said to not take this  ? Clorazepate Dipotassium Other (See Comments)  ?  Interacts with a drug being taken  ? Simvastatin Other (See Comments)  ?  Muscle pain  ? Tiotropium Bromide Monohydrate Other (See Comments)  ?  Dry mouth  ? Codeine Nausea And Vomiting  ? Tape Rash  ? ? ?Social History  ? ?Socioeconomic History  ? Marital status: Married  ?  Spouse name: [name]  ? Number of children: 4  ? Years of education: 61  ? Highest education level: Not on file  ?Occupational History  ? Occupation: retired  ?Tobacco Use  ? Smoking status: Former  ?  Types: Cigarettes  ?  Quit date: 06/02/2001  ?  Years since quitting: 20.3  ? Smokeless tobacco: Never  ?Vaping Use  ? Vaping Use: Never used  ?Substance and Sexual Activity  ? Alcohol use: Yes  ?  Alcohol/week: 0.0 standard drinks  ?  Comment: social-1 every 6 months  ? Drug use: No  ? Sexual activity: Not on file  ?Other Topics Concern  ? Not on file  ?Social History Narrative  ? Drinks very little caffeine   ? ?Social Determinants of Health  ? ?Financial Resource Strain: Not on file  ?Food Insecurity: Not on file  ?Transportation Needs: Not on file  ?Physical Activity: Not on file  ?Stress: Not on file  ?Social Connections: Not on file  ?Intimate Partner Violence: Not on file  ?  ? ?Review of Systems: ?General: negative for chills, fever, night sweats or weight changes.  ?Cardiovascular: negative for chest pain,  dyspnea on exertion, edema, orthopnea, palpitations, paroxysmal nocturnal dyspnea or shortness of breath ?Dermatological: negative for rash ?Respiratory: negative for cough or wheezing ?Urologic: negative for hematuria ?Abdominal: negative for nausea, vomiting, diarrhea, bright red blood per rectum, melena, or hematemesis ?Neurologic: negative for visual changes, syncope, or dizziness ?All other systems reviewed and are otherwise negative except as noted above. ? ? ? ?Blood pressure 120/60, pulse 70, height 5' 2"  (1.575 m), weight 103 lb 12.8 oz (47.1 kg), SpO2 97 %.  ?General appearance: alert and no distress ?Neck: no adenopathy, no carotid bruit, no JVD, supple, symmetrical, trachea midline, and thyroid not enlarged, symmetric, no tenderness/mass/nodules ?Lungs: clear to auscultation bilaterally ?Heart: Crisp mitral prosthetic valve sounds ?Extremities: extremities normal,  atraumatic, no cyanosis or edema ?Pulses: 2+ and symmetric ?Skin: Skin color, texture, turgor normal. No rashes or lesions ?Neurologic: Grossly normal ? ?EKG not performed today ? ?ASSESSMENT AND PLAN:  ? ?History of mitral valve replacement with mechanical valve ?History of mitral valve repair by by Dr. 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 Cyndia Bent in 2007.  Her most recent echo performed 08/16/2020 revealing a well-functioning functioning mitral mechanical prosthesis without evidence of MR.  She is on Coumadin anticoagulation followed in our Coumadin clinic. ? ?HYPERLIPIDEMIA ?History of hyperlipidemia on statin therapy with lipid profile performed 01/04/2021 revealing total cholesterol of 229, LDL 138 and HDL of 37.  She does have normal coronary arteries angiographically. ? ?Biventricular ICD (implantable cardioverter-defibrillator) in place ?History of ICD implantation by Dr. Sallyanne Kuster for resynchronization therapy 04/12/2011.  Her initial EF was 10% which resulted in almost normalization of her LV  function in the 45 to 50% range.  She is completely asymptomatic. ? ?Chronic diastolic (congestive) heart failure (HCC) ?History of diastolic dysfunction on furosemide. ? ?Paroxysmal atrial fibrillation ([place]) ?Histor

## 2021-10-30 ENCOUNTER — Ambulatory Visit (HOSPITAL_COMMUNITY): Payer: Medicare Other | Attending: Cardiology

## 2021-10-30 DIAGNOSIS — I5032 Chronic diastolic (congestive) heart failure: Secondary | ICD-10-CM

## 2021-10-30 LAB — ECHOCARDIOGRAM COMPLETE
Area-P 1/2: 3.03 cm2
Calc EF: 39.5 %
MV VTI: 1.52 cm2
S' Lateral: 3.6 cm
Single Plane A2C EF: 38.7 %
Single Plane A4C EF: 40.3 %

## 2021-10-31 ENCOUNTER — Ambulatory Visit: Payer: Medicare Other | Admitting: Podiatry

## 2021-11-07 ENCOUNTER — Encounter: Payer: Self-pay | Admitting: Cardiovascular Disease

## 2021-11-07 ENCOUNTER — Ambulatory Visit (INDEPENDENT_AMBULATORY_CARE_PROVIDER_SITE_OTHER): Payer: Medicare Other | Admitting: Cardiovascular Disease

## 2021-11-07 VITALS — BP 128/50 | HR 74 | Ht 62.0 in | Wt 98.2 lb

## 2021-11-07 DIAGNOSIS — I498 Other specified cardiac arrhythmias: Secondary | ICD-10-CM

## 2021-11-07 DIAGNOSIS — D6869 Other thrombophilia: Secondary | ICD-10-CM | POA: Diagnosis not present

## 2021-11-07 DIAGNOSIS — I5042 Chronic combined systolic (congestive) and diastolic (congestive) heart failure: Secondary | ICD-10-CM

## 2021-11-07 DIAGNOSIS — N1832 Chronic kidney disease, stage 3b: Secondary | ICD-10-CM

## 2021-11-07 DIAGNOSIS — Z95810 Presence of automatic (implantable) cardiac defibrillator: Secondary | ICD-10-CM

## 2021-11-07 DIAGNOSIS — Z952 Presence of prosthetic heart valve: Secondary | ICD-10-CM | POA: Diagnosis not present

## 2021-11-07 DIAGNOSIS — Z8679 Personal history of other diseases of the circulatory system: Secondary | ICD-10-CM

## 2021-11-07 MED ORDER — METOPROLOL SUCCINATE ER 25 MG PO TB24: ORAL_TABLET | ORAL | 5 refills | Status: DC

## 2021-11-07 NOTE — Patient Instructions (Signed)
Medication Instructions:  INCREASE the Metoprolol Succinate to 25 mg in the morning and 50 mg in the evening.  *If you need a refill on your cardiac medications before your next appointment, please call your pharmacy*   Lab Work: None ordered If you have labs (blood work) drawn today and your tests are completely normal, you will receive your results only by: [place] (if you have MyChart) OR A paper copy in the mail If you have any lab test that is abnormal or we need to change your treatment, we will call you to review the results.   Testing/Procedures: None ordered   Follow-Up: At [facility], you and your health needs are our priority.  As part of our continuing mission to provide you with exceptional heart care, we have created designated Provider Care Teams.  These Care Teams include your primary Cardiologist (physician) and Advanced Practice Providers (APPs -  Physician Assistants and Nurse Practitioners) who all work together to provide you with the care you need, when you need it.  We recommend signing up for the patient portal called "MyChart".  Sign up information is provided on this After Visit Summary.  MyChart is used to connect with patients for Virtual Visits (Telemedicine).  Patients are able to view lab/test results, encounter notes, upcoming appointments, etc.  Non-urgent messages can be sent to your provider as well.   To learn more about what you can do with MyChart, go to [email or web address].    Your next appointment:   6 month(s) on a device day  The format for your next appointment:   In Person  Provider:   Dr. Sallyanne Kuster

## 2021-11-07 NOTE — Progress Notes (Signed)
Cardiology office note    Date:  11/08/2021   ID:  [name], [name] 1940-07-19, MRN [id number]  PCP:  [name], MD  Cardiologist: Quay Burow, MD  Electrophysiologist:  Sanda Klein, MD   Evaluation Performed:  Follow-Up Visit  Chief Complaint:  ICD follow up  History of Present Illness:    [name] is a 81 y.o. female with  mechanical mitral valve replacement (repair 2002, replacement 2007,  biventricular pacemaker/defibrillator  (Medtronic [place], generator changeout in November 2018) and chronic heart failure.    She continues to have problems with periods of intractable nausea and vomiting.  She has lost weight again, 6 pounds in just the last month and is back down under 100 pounds as she was last December.  Her BMI is just under 18.  Her echocardiogram shows worsening left ventricular systolic function with an EF that is down to 30-35%.  The cause of this is not immediately clear, but interrogation of her biventricular device does show reduction in effective biventricular pacing from 93% to only 89%.  Reviewing the tracings today this appears to be due to increased frequency of ventricular sensed beats, possibly due to periods of junctional rhythm.  The atrial and ventricular electrograms appear to be essentially superimposed.  Biventricular pacing is lost even though the programmed AV delay is very tight (sensed AV delay 80 ms, paced AV delay 100 ms).  "Triggered" BiV pacing is on, but the shape of the electrograms suggest that were not doing a great job with this as far as getting fusion.  There have been no episodes of atrial fibrillation and no episodes of ventricular tachycardia.  Atrial pacing occurs 55% of the time.  She is extremely sedentary.  The device actually records activity level at 0.0 hours a day.  Lead parameters are good and the estimated generator longevity is 3.7 years.  There are some far field R wave oversensing on the atrial  channel, but this is consistently in the blanking period..  For her activity level, he denies shortness of breath.  She does not have lower extremity edema.  Her OptiVol was in fluid overload range throughout the [facility], but is now back to baseline.     In early 2020 she began developing" noise" on the right ventricular pace-sense channel.  She had recovered left ventricular systolic function to an ejection fraction of 50-55% and she has never received tachycardia therapies.  Ventricular arrhythmia detection and therapies are turned off.  Echo performed in March 2022 shows stable left ventricular ejection fraction of 45-50%.  The mean mitral valve gradient is 4 mmHg at a heart rate of 86 bpm which is also stable. Echo repeated 10/30/2021 shows reduced LVEF down to 30-35%.  This is due to global hypokinesis.  Mitral valve prosthetic function is normal.  Mean gradient 3 mmHg at 70 bpm.   Unfortunately, [name] has lost both her sons to substance abuse at the age of 85.  She is raising her 79 year old grandson, since both of his parents died by overdose.  She has a great relationship with her other grandchild a 66 year old granddaughter who is living independently.  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 Left ventricular ejection fraction which has been as low as 10% improved substantially after implantation of the CRT-D device and now her LVEF is near normal. She received  her initial CRT-D in 2008 and underwent a generator change out in 2012 and 2019. She has not received VT therapy. She has a Riata 7 [place] ICD lead under advisory, which appeared fluoroscopically normal at the time of last generator change.  Did not have any angiographic CAD prior to her surgery.     The atrial lead is a Medtronic [id number] serial number [id number] implanted  in July 23, 2006 [name]) The right ventricular lead is a Chemical engineer 7002 serial number [id number] implanted same date The left ventricular lead is a Guidant BS CI 4543 LV lead, serial [id number] implanted same date She had redundant Medtronic left ventricular epicardial pacing leads implanted at the time of surgery in 2007.   Past Medical History:  Diagnosis Date   Allergy    seasonal   Anemia    Anxiety    Arthritis    Asthma    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 Protecta   Biventricular ICD (implantable cardioverter-defibrillator) in place    with CRT   Blood transfusion without reported diagnosis    Bursitis    Cataract    RIGHT EYE   CHF (congestive heart failure) ([place])    Chronic kidney disease    Colon polyp    adenomatous   Complication of anesthesia    patient stated that had difficulty getting the breathing tube removed, patient said that she stopped breathing and HR dropped to 10  patient then woke up and started breathing pateint stated no longer than one minute; re-intubated in PACU following cholecystectomy 10/28/13   COPD (chronic obstructive pulmonary disease) ([place])    Depression    Diverticulosis    Dysrhythmia    Fibromyalgia    GERD (gastroesophageal reflux disease)    Gout    H/O mitral valve replacement 2002, 2007   Heart murmur    Hemorrhoids    Hyperlipidemia    Hypertension    Hypothyroidism    IBS (irritable bowel syndrome)    MVP (mitral valve prolapse)    Neuromuscular disorder ([place])    fibromyalgia   Pacemaker    Peptic ulcer disease    Past Surgical History:  Procedure Laterality Date   ABDOMINAL HYSTERECTOMY     BIV ICD GENERATOR CHANGEOUT N/A 04/29/2017   Procedure: BIV ICD GENERATOR CHANGEOUT;  Surgeon: Sanda Klein, MD;  Location: [place] [facility];  Service: Cardiovascular;  Laterality: N/A;   CARDIAC CATHETERIZATION  02/25/2006   normal left main, normal LAD, normal L Cfx,  normal/dominant RCA (Dr. [name])   CARDIAC DEFIBRILLATOR PLACEMENT  2007, 11/202012   x2 (pacemaker) (Dr. [name])   [place]  2002   MV repair - Dr. Berle Mull   CHOLECYSTECTOMY N/A 10/28/2013   Procedure: LAPAROSCOPIC CHOLECYSTECTOMY WITH INTRAOPERATIVE CHOLANGIOGRAM;  Surgeon: Edward Jolly, MD;  Location: [place];  Service: General;  Laterality: N/A;   COLONOSCOPY     ESOPHAGOGASTRODUODENOSCOPY (EGD) WITH PROPOFOL N/A 03/02/2021   Procedure: ESOPHAGOGASTRODUODENOSCOPY (EGD) WITH PROPOFOL;  Surgeon: Daryel November, MD;  Location: [place];  Service: Gastroenterology;  Laterality: N/A;   EYE SURGERY     HEMOSTASIS CLIP PLACEMENT  03/02/2021   Procedure: HEMOSTASIS CLIP PLACEMENT;  Surgeon: Daryel November, MD;  Location: [facility] ENDOSCOPY;  Service: Gastroenterology;;   HOT HEMOSTASIS  03/02/2021   Procedure: HOT HEMOSTASIS (ARGON PLASMA COAGULATION/BICAP);  Surgeon: Daryel November, MD;  Location: [place];  Service: Gastroenterology;;   IMPLANTABLE CARDIOVERTER DEFIBRILLATOR (  ICD) GENERATOR CHANGE N/A 04/10/2011   Procedure: ICD GENERATOR CHANGE;  Surgeon: Sanda Klein, MD;  Location: [facility] CATH LAB;  Service: Cardiovascular;  Laterality: N/A;   INSERT / REPLACE / REMOVE PACEMAKER     KNEE ARTHROSCOPY     MITRAL VALVE REPLACEMENT  02/26/2006   re-do MVR w/6m St. [name] (Dr. BEllison Hughs   [place]   persantine myoview - low ris, EF 63%   RIGHT HEART CATH  04/03/2006   pulm cap wedge pressure 24/24, PA pressure 43/22 (mean 325mg), CO 4.8, CI 4.1 (Dr. J.Jackie Plum  [place]   Procedure: SCClide Deutscher Surgeon: CuDaryel NovemberMD;  [facility];  Service: Gastroenterology;;   TOTAL SHOULDER ARTHROPLASTY Right 12/27/2015   Procedure: RIGHT TOTAL SHOULDER ARTHROPLASTY;  Surgeon: JuTania AdeMD;  [place] [facility] [place]: [place];  Laterality: Right;  Right total shoulder arthroplasty    TOTAL SHOULDER ARTHROPLASTY Left 10/27/2019   Procedure: REVERSE TOTAL SHOULDER ARTHROPLASTY;  Surgeon: ChTania AdeMD;  Location: [facility];  Service: Orthopedics;  Laterality: Left;   TRANSTHORACIC ECHOCARDIOGRAM  12/2011   EF 50-55%, mild global hypokinesis; LA severely dilated; calcification of anterior/posterior MV leaflets, bi-leaflets St. [name] mechanical MV; mild TR; trace AV regurg/pulm valve regurg     Current Facility-Administered Medications for the 11/07/21 encounter (Office Visit) with Kalynn Declercq, MiDani GobbleMD  Medication   0.9 %  sodium chloride infusion   Current Meds  Medication Sig   acetaminophen (TYLENOL) 650 MG CR tablet Take 650 mg by mouth every 8 (eight) hours as needed for pain.   allopurinol (ZYLOPRIM) 300 MG tablet Take 300 mg by mouth every evening.    ALPRAZolam (XANAX) 0.25 MG tablet Take 0.25 mg by mouth at bedtime as needed for anxiety or sleep.    buPROPion (WELLBUTRIN SR) 150 MG 12 hr tablet Take 150 mg by mouth in the morning.   esomeprazole (NEXIUM) 20 MG capsule Take 1 capsule (20 mg total) by mouth 2 (two) times daily before a meal. (Patient taking differently: Take 20 mg by mouth daily at 12 noon.)   furosemide (LASIX) 40 MG tablet TAKE 1 TABLET BY MOUTH DAILY. **NEEDS OV FOR FUTURE REFILLS** (Patient taking differently: Take 40 mg by mouth daily.)   metoCLOPramide (REGLAN) 5 MG tablet Take 5 mg by mouth 2 (two) times daily as needed for nausea or vomiting.   Multiple Vitamin (MULTIVITAMIN WITH MINERALS) TABS tablet Take 1 tablet by mouth daily.   pravastatin (PRAVACHOL) 40 MG tablet Take 40 mg at bedtime by mouth.    Sennosides (SENNA) 15 MG TABS Take 30 mg by mouth at bedtime.   spironolactone (ALDACTONE) 25 MG tablet Take 1 tablet (25 mg total) by mouth daily.   VOLTAREN 1 % GEL Apply 2-4 g topically in the morning and at bedtime.   warfarin (COUMADIN) 2.5 MG tablet Take 1-2 tablets (2.5-5 mg total) by mouth See admin instructions. Take 5 mg by mouth after 6  PM on Sun/Tues/Wed/Thurs/Sat and 2.5 mg on Mon/Fri (Patient taking differently: Take 2.5-5 mg by mouth See admin instructions. Take 1 tablet (2.5 mg) on Monday, Wednesday, Friday Then take 2 tablets (5 mg) on Sunday, Tuesday, Thursday and Saturday or as directed by coumadin clinic)   [DISCONTINUED] metoprolol succinate (TOPROL-XL) 25 MG 24 hr tablet TAKE 1 AND 1/2 TABLETS (37.5 MG TOTAL) BY MOUTH DAILY.     Allergies:   Ace inhibitors, Lipitor [atorvastatin calcium], Megace [megestrol], Aspirin,  Clorazepate dipotassium, Simvastatin, Tiotropium bromide monohydrate, Codeine, and Tape   Social History   Tobacco Use   Smoking status: Former    Types: Cigarettes    Quit date: 06/02/2001    Years since quitting: 20.4   Smokeless tobacco: Never  Vaping Use   Vaping Use: Never used  Substance Use Topics   Alcohol use: Yes    Alcohol/week: 0.0 standard drinks of alcohol    Comment: social-1 every 6 months   Drug use: No     Family Hx: The patient's family history includes Breast cancer in her sister; Crohn's disease in her sister; Heart attack in her father and mother; Heart disease in her father, mother, sister, and son; Kidney disease in her father; Uterine cancer in her sister. There is no history of Colon cancer or Stomach cancer.  ROS:   Please see the history of present illness.    All other systems are reviewed and are negative.  Prior CV studies:   The following studies were reviewed today: 10/30/2021 ECHO  1. Left ventricular ejection fraction, by estimation, is 30 to 35%. The  left ventricle has moderately decreased function. The left ventricle  demonstrates global hypokinesis. Left ventricular diastolic parameters are  indeterminate.   2. Right ventricular systolic function is normal. The right ventricular  size is normal. Tricuspid regurgitation signal is inadequate for assessing  PA pressure.   3. Left atrial size was mildly dilated.   4. The mitral valve has been  repaired/replaced. Trivial mitral valve  regurgitation. The mean mitral valve gradient is 3.0 mmHg with average  heart rate of 70 bpm. There is a 25 mm St. [name] mechanical valve present  in the mitral position. Procedure Date:  02/26/2006.   5. The aortic valve is tricuspid. Aortic valve regurgitation is mild.  Aortic valve sclerosis is present, with no evidence of aortic valve  stenosis.   6. The inferior vena cava is normal in size with greater than 50%  respiratory variability, suggesting right atrial pressure of 3 mmHg.        Labs/Other Tests and Data Reviewed:    EKG: Intracardiac electrogram shows mostly atrial paced, biventricular paced rhythm.  There are very frequent premature ventricular or junctional beats.  Some of these trigger ventricular pacing, but there appears to be very little in the way of fusion on the electrogram.  At times there are consecutive ventricular sensed beats with essentially superimposed atrial electrogram suggesting possible accelerated junctional rhythm  Recent Labs: 11/08/2021: ALT 16; BUN 61; Creatinine, Ser 1.70; Hemoglobin 4.6; Platelets 233; Potassium 3.8; Sodium 134   Recent Lipid Panel Lab Results  Component Value Date/Time   CHOL 198 11/23/2020 10:07 AM   TRIG 363 (H) 11/23/2020 10:07 AM   HDL 27 (L) 11/23/2020 10:07 AM   CHOLHDL 7.3 (H) 11/23/2020 10:07 AM   CHOLHDL 8.2 12/09/2006 04:10 AM   LDLCALC 108 (H) 11/23/2020 10:07 AM    11/23/2020 Total cholesterol 198, HDL 27, LDL 108, triglycerides 363  Wt Readings from Last 3 Encounters:  11/07/21 98 lb 3.2 oz (44.5 kg)  10/09/21 103 lb 12.8 oz (47.1 kg)  05/16/21 94 lb (42.6 kg)     Objective:    Vital Signs:  BP (!) 128/50 (BP Location: Left Arm, Patient Position: Sitting, Cuff Size: Small) Comment (Cuff Size): Adult  Pulse 74   Ht 5' 2"  (1.575 m)   Wt 98 lb 3.2 oz (44.5 kg)   SpO2 95%   BMI 17.96 kg/m    .  General: Alert, oriented x3, no distress, underweight, appears frail  and thin Head: no evidence of trauma, PERRL, EOMI, no exophtalmos or lid lag, no myxedema, no xanthelasma; normal ears, nose and oropharynx Neck: normal jugular venous pulsations and no hepatojugular reflux; brisk carotid pulses without delay and no carotid bruits Chest: clear to auscultation, no signs of consolidation by percussion or palpation, normal fremitus, symmetrical and full respiratory excursions Cardiovascular: normal position and quality of the apical impulse, regular rhythm, early peaking 1/6 aortic ejection murmur, crisp prosthetic valve clicks, no diastolic murmurs, rubs or gallops.  The device site has extensive scar tissue with fat atrophy, but there is no evidence of swelling, redness or tenderness. Abdomen: no tenderness or distention, no masses by palpation, no abnormal pulsatility or arterial bruits, normal bowel sounds, no hepatosplenomegaly Extremities: no clubbing, cyanosis or edema; 2+ radial, ulnar and brachial pulses bilaterally; 2+ right femoral, posterior tibial and dorsalis pedis pulses; 2+ left femoral, posterior tibial and dorsalis pedis pulses; no subclavian or femoral bruits Neurological: grossly nonfocal Psych: Normal mood and affect    ASSESSMENT & PLAN:    CHF: Clinically euvolemic.  Hard to assess functional status and she remains extremely sedentary.  Appears frail and debilitated.  OptiVol was out of range January-March but is now back in normal range.  LVEF has recently decreased down to 30-35%.  There is some reduction in the effective biventricular pacing, but I am not sure that the reduced LVEF can be entirely explained by this. MVR: Normal prosthetic valve function. Anticoagulation: Markedly supratherapeutic INR was identified after her appointment today.  She has not had any bleeding problems thankfully. CRT-D: Defibrillator therapies are not active on her device.  Problems with oversensing on her right ventricular lead and recovery of LV function have  led to discontinuation of tachycardia therapies.  She now has diminished left ventricular ejection fraction again.  At this point in time she appears to be a poor candidate to consider lead revision.  There has been some deterioration in biventricular resynchronization pacing efficiency and this appears to be due to a lot of premature ventricular or junctional beats.  Atrial channel oversensing (far field R wave) has improved with the changes made at her last appointment (atrial sensing increased to 0.9 mV.  Postatrial paced A-blanking prolonged).  I think we should refer to EP to see if there is any way to improve the device settings to achieve better CRT or whether or not they think lead revision is justified under the circumstances of reduced LVEF PVCs/junctional rhythm: We will increase the dose of metoprolol to a total of 75 mg daily. AFib: No true episodes of A-fib are seen recently. CKD 3: Creatinine of 1.09 is actually better than the previously estimated baseline.    Patient Instructions  Medication Instructions:  INCREASE the Metoprolol Succinate to 25 mg in the morning and 50 mg in the evening.  *If you need a refill on your cardiac medications before your next appointment, please call your pharmacy*   Lab Work: None ordered If you have labs (blood work) drawn today and your tests are completely normal, you will receive your results only by: [place] (if you have MyChart) OR A paper copy in the mail If you have any lab test that is abnormal or we need to change your treatment, we will call you to review the results.   Testing/Procedures: None ordered   Follow-Up: At [facility], you and your health needs are our priority.  As  part of our continuing mission to provide you with exceptional heart care, we have created designated Provider Care Teams.  These Care Teams include your primary Cardiologist (physician) and Advanced Practice Providers (APPs -  Physician Assistants  and Nurse Practitioners) who all work together to provide you with the care you need, when you need it.  We recommend signing up for the patient portal called "MyChart".  Sign up information is provided on this After Visit Summary.  MyChart is used to connect with patients for Virtual Visits (Telemedicine).  Patients are able to view lab/test results, encounter notes, upcoming appointments, etc.  Non-urgent messages can be sent to your provider as well.   To learn more about what you can do with MyChart, go to [email or web address].    Your next appointment:   6 month(s) on a device day  The format for your next appointment:   In Person  Provider:   Dr. Sallyanne Kuster    Signed, Sanda Klein, MD  11/08/2021 1:22 PM    [place]

## 2021-11-08 ENCOUNTER — Encounter (HOSPITAL_COMMUNITY): Payer: Self-pay | Admitting: Emergency Medicine

## 2021-11-08 ENCOUNTER — Other Ambulatory Visit: Payer: Self-pay

## 2021-11-08 ENCOUNTER — Inpatient Hospital Stay (HOSPITAL_COMMUNITY)
Admission: EM | Admit: 2021-11-08 | Discharge: 2021-11-12 | DRG: 378 | Disposition: A | Discharge status: Home / Self Care | Payer: Medicare Other | Attending: Internal Medicine | Admitting: Internal Medicine

## 2021-11-08 ENCOUNTER — Emergency Department (HOSPITAL_COMMUNITY): Payer: Medicare Other

## 2021-11-08 DIAGNOSIS — K31819 Angiodysplasia of stomach and duodenum without bleeding: Secondary | ICD-10-CM | POA: Diagnosis not present

## 2021-11-08 DIAGNOSIS — R791 Abnormal coagulation profile: Secondary | ICD-10-CM | POA: Diagnosis not present

## 2021-11-08 DIAGNOSIS — K921 Melena: Secondary | ICD-10-CM | POA: Diagnosis not present

## 2021-11-08 DIAGNOSIS — I480 Paroxysmal atrial fibrillation: Secondary | ICD-10-CM | POA: Diagnosis present

## 2021-11-08 DIAGNOSIS — T45515A Adverse effect of anticoagulants, initial encounter: Secondary | ICD-10-CM | POA: Diagnosis not present

## 2021-11-08 DIAGNOSIS — K2289 Other specified disease of esophagus: Secondary | ICD-10-CM | POA: Diagnosis not present

## 2021-11-08 DIAGNOSIS — K922 Gastrointestinal hemorrhage, unspecified: Secondary | ICD-10-CM

## 2021-11-08 DIAGNOSIS — E039 Hypothyroidism, unspecified: Secondary | ICD-10-CM | POA: Diagnosis present

## 2021-11-08 DIAGNOSIS — Z79890 Hormone replacement therapy (postmenopausal): Secondary | ICD-10-CM

## 2021-11-08 DIAGNOSIS — Z96612 Presence of left artificial shoulder joint: Secondary | ICD-10-CM | POA: Diagnosis present

## 2021-11-08 DIAGNOSIS — I5042 Chronic combined systolic (congestive) and diastolic (congestive) heart failure: Secondary | ICD-10-CM | POA: Diagnosis present

## 2021-11-08 DIAGNOSIS — K219 Gastro-esophageal reflux disease without esophagitis: Secondary | ICD-10-CM | POA: Diagnosis present

## 2021-11-08 DIAGNOSIS — I428 Other cardiomyopathies: Secondary | ICD-10-CM | POA: Diagnosis present

## 2021-11-08 DIAGNOSIS — R58 Hemorrhage, not elsewhere classified: Secondary | ICD-10-CM | POA: Diagnosis not present

## 2021-11-08 DIAGNOSIS — I130 Hypertensive heart and chronic kidney disease with heart failure and stage 1 through stage 4 chronic kidney disease, or unspecified chronic kidney disease: Secondary | ICD-10-CM | POA: Diagnosis present

## 2021-11-08 DIAGNOSIS — M109 Gout, unspecified: Secondary | ICD-10-CM | POA: Diagnosis present

## 2021-11-08 DIAGNOSIS — Z96611 Presence of right artificial shoulder joint: Secondary | ICD-10-CM | POA: Diagnosis present

## 2021-11-08 DIAGNOSIS — Z803 Family history of malignant neoplasm of breast: Secondary | ICD-10-CM

## 2021-11-08 DIAGNOSIS — D689 Coagulation defect, unspecified: Secondary | ICD-10-CM | POA: Diagnosis not present

## 2021-11-08 DIAGNOSIS — Z841 Family history of disorders of kidney and ureter: Secondary | ICD-10-CM | POA: Diagnosis not present

## 2021-11-08 DIAGNOSIS — E785 Hyperlipidemia, unspecified: Secondary | ICD-10-CM | POA: Diagnosis present

## 2021-11-08 DIAGNOSIS — Z8049 Family history of malignant neoplasm of other genital organs: Secondary | ICD-10-CM | POA: Diagnosis not present

## 2021-11-08 DIAGNOSIS — K5520 Angiodysplasia of colon without hemorrhage: Secondary | ICD-10-CM

## 2021-11-08 DIAGNOSIS — I110 Hypertensive heart disease with heart failure: Secondary | ICD-10-CM | POA: Diagnosis not present

## 2021-11-08 DIAGNOSIS — Z8249 Family history of ischemic heart disease and other diseases of the circulatory system: Secondary | ICD-10-CM

## 2021-11-08 DIAGNOSIS — K31811 Angiodysplasia of stomach and duodenum with bleeding: Principal | ICD-10-CM | POA: Diagnosis present

## 2021-11-08 DIAGNOSIS — Z952 Presence of prosthetic heart valve: Secondary | ICD-10-CM

## 2021-11-08 DIAGNOSIS — D649 Anemia, unspecified: Secondary | ICD-10-CM

## 2021-11-08 DIAGNOSIS — Z95810 Presence of automatic (implantable) cardiac defibrillator: Secondary | ICD-10-CM | POA: Diagnosis not present

## 2021-11-08 DIAGNOSIS — N179 Acute kidney failure, unspecified: Secondary | ICD-10-CM | POA: Diagnosis present

## 2021-11-08 DIAGNOSIS — Z79899 Other long term (current) drug therapy: Secondary | ICD-10-CM

## 2021-11-08 DIAGNOSIS — K449 Diaphragmatic hernia without obstruction or gangrene: Secondary | ICD-10-CM | POA: Diagnosis present

## 2021-11-08 DIAGNOSIS — K5900 Constipation, unspecified: Secondary | ICD-10-CM | POA: Diagnosis present

## 2021-11-08 DIAGNOSIS — J449 Chronic obstructive pulmonary disease, unspecified: Secondary | ICD-10-CM | POA: Diagnosis present

## 2021-11-08 DIAGNOSIS — N183 Chronic kidney disease, stage 3 unspecified: Secondary | ICD-10-CM | POA: Diagnosis present

## 2021-11-08 DIAGNOSIS — D62 Acute posthemorrhagic anemia: Secondary | ICD-10-CM

## 2021-11-08 DIAGNOSIS — Z87891 Personal history of nicotine dependence: Secondary | ICD-10-CM | POA: Diagnosis not present

## 2021-11-08 DIAGNOSIS — M797 Fibromyalgia: Secondary | ICD-10-CM | POA: Diagnosis present

## 2021-11-08 DIAGNOSIS — Z7901 Long term (current) use of anticoagulants: Secondary | ICD-10-CM | POA: Diagnosis not present

## 2021-11-08 DIAGNOSIS — Z90710 Acquired absence of both cervix and uterus: Secondary | ICD-10-CM

## 2021-11-08 LAB — COMPREHENSIVE METABOLIC PANEL
ALT: 16 U/L (ref 0–44)
AST: 23 U/L (ref 15–41)
Albumin: 2.5 g/dL — ABNORMAL LOW (ref 3.5–5.0)
Alkaline Phosphatase: 47 U/L (ref 38–126)
Anion gap: 11 (ref 5–15)
BUN: 61 mg/dL — ABNORMAL HIGH (ref 8–23)
CO2: 22 mmol/L (ref 22–32)
Calcium: 8.1 mg/dL — ABNORMAL LOW (ref 8.9–10.3)
Chloride: 101 mmol/L (ref 98–111)
Creatinine, Ser: 1.7 mg/dL — ABNORMAL HIGH (ref 0.44–1.00)
GFR, Estimated: 30 mL/min — ABNORMAL LOW (ref >60)
Glucose, Bld: 148 mg/dL — ABNORMAL HIGH (ref 70–99)
Potassium: 3.8 mmol/L (ref 3.5–5.1)
Sodium: 134 mmol/L — ABNORMAL LOW (ref 135–145)
Total Bilirubin: 0.5 mg/dL (ref 0.3–1.2)
Total Protein: 5.2 g/dL — ABNORMAL LOW (ref 6.5–8.1)

## 2021-11-08 LAB — APTT: aPTT: 66 seconds — ABNORMAL HIGH (ref 24–36)

## 2021-11-08 LAB — CBC
HCT: 14.4 % — ABNORMAL LOW (ref 36.0–46.0)
HCT: 24 % — ABNORMAL LOW (ref 36.0–46.0)
Hemoglobin: 4.6 g/dL — CL (ref 12.0–15.0)
Hemoglobin: 8.3 g/dL — ABNORMAL LOW (ref 12.0–15.0)
MCH: 31.1 pg (ref 26.0–34.0)
MCH: 29.3 pg (ref 26.0–34.0)
MCHC: 31.9 g/dL (ref 30.0–36.0)
MCHC: 34.6 g/dL (ref 30.0–36.0)
MCV: 97.3 fL (ref 80.0–100.0)
MCV: 84.8 fL (ref 80.0–100.0)
Platelets: 233 10*3/uL (ref 150–400)
Platelets: 155 10*3/uL (ref 150–400)
RBC: 1.48 MIL/uL — ABNORMAL LOW (ref 3.87–5.11)
RBC: 2.83 MIL/uL — ABNORMAL LOW (ref 3.87–5.11)
RDW: 15.8 % — ABNORMAL HIGH (ref 11.5–15.5)
RDW: 16.2 % — ABNORMAL HIGH (ref 11.5–15.5)
WBC: 11 10*3/uL — ABNORMAL HIGH (ref 4.0–10.5)
WBC: 11.3 10*3/uL — ABNORMAL HIGH (ref 4.0–10.5)
nRBC: 0 % (ref 0.0–0.2)
nRBC: 0.4 % — ABNORMAL HIGH (ref 0.0–0.2)

## 2021-11-08 LAB — POC OCCULT BLOOD, ED: Fecal Occult Bld: POSITIVE — AB

## 2021-11-08 LAB — PROTIME-INR
INR: >10 (ref 0.8–1.2)
INR: 2.5 — ABNORMAL HIGH (ref 0.8–1.2)
Prothrombin Time: >90 seconds — ABNORMAL HIGH (ref 11.4–15.2)
Prothrombin Time: 27 seconds — ABNORMAL HIGH (ref 11.4–15.2)

## 2021-11-08 LAB — PREPARE RBC (CROSSMATCH)

## 2021-11-08 LAB — TROPONIN I (HIGH SENSITIVITY): Troponin I (High Sensitivity): 11 ng/L (ref <18)

## 2021-11-08 MED ORDER — POLYETHYLENE GLYCOL 3350 17 G PO PACK: 17.0000 g | PACK | Freq: Every day | ORAL | Status: DC | PRN

## 2021-11-08 MED ORDER — ONDANSETRON HCL 4 MG/2ML IJ SOLN: 4.0000 mg | Freq: Four times a day (QID) | INTRAMUSCULAR | Status: DC | PRN

## 2021-11-08 MED ORDER — ALPRAZOLAM 0.25 MG PO TABS
0.2500 mg | ORAL_TABLET | Freq: Every evening | ORAL | Status: DC | PRN
  Administered 2021-11-09 – 2021-11-10 (×2): 0.25 mg via ORAL
  Filled 2021-11-08 (×2): qty 1

## 2021-11-08 MED ORDER — LEVOTHYROXINE SODIUM 50 MCG PO TABS
50.0000 ug | ORAL_TABLET | Freq: Every day | ORAL | Status: DC
Start: 2021-11-09 — End: 2021-11-12
  Administered 2021-11-09 – 2021-11-12 (×4): 50 ug via ORAL
  Filled 2021-11-08 (×3): qty 1

## 2021-11-08 MED ORDER — VITAMIN K1 10 MG/ML IJ SOLN
5.0000 mg | Freq: Once | INTRAMUSCULAR | Status: DC
  Filled 2021-11-08: qty 0.5

## 2021-11-08 MED ORDER — ACETAMINOPHEN 650 MG RE SUPP: 650.0000 mg | Freq: Four times a day (QID) | RECTAL | Status: DC | PRN

## 2021-11-08 MED ORDER — VITAMIN K1 10 MG/ML IJ SOLN
10.0000 mg | Freq: Once | INTRAVENOUS | Status: DC
  Filled 2021-11-08: qty 1

## 2021-11-08 MED ORDER — PRAVASTATIN SODIUM 40 MG PO TABS
40.0000 mg | ORAL_TABLET | Freq: Every day | ORAL | Status: DC
  Administered 2021-11-08 – 2021-11-11 (×4): 40 mg via ORAL
  Filled 2021-11-08 (×4): qty 1

## 2021-11-08 MED ORDER — SODIUM CHLORIDE 0.9 % IV SOLN
10.0000 mL/h | Freq: Once | INTRAVENOUS | Status: DC
Start: 2021-11-08 — End: 2021-11-08

## 2021-11-08 MED ORDER — SODIUM CHLORIDE 0.9 % IV SOLN
10.0000 mL/h | Freq: Once | INTRAVENOUS | Status: AC
  Administered 2021-11-08: 10 mL/h via INTRAVENOUS

## 2021-11-08 MED ORDER — ACETAMINOPHEN 325 MG PO TABS
650.0000 mg | ORAL_TABLET | Freq: Four times a day (QID) | ORAL | Status: DC | PRN
  Administered 2021-11-09: 650 mg via ORAL
  Filled 2021-11-08: qty 2

## 2021-11-08 MED ORDER — ONDANSETRON HCL 4 MG PO TABS: 4.0000 mg | ORAL_TABLET | Freq: Four times a day (QID) | ORAL | Status: DC | PRN

## 2021-11-08 MED ORDER — PANTOPRAZOLE 80MG IVPB - SIMPLE MED
80.0000 mg | Freq: Once | INTRAVENOUS | Status: AC
  Administered 2021-11-08: 80 mg via INTRAVENOUS
  Filled 2021-11-08: qty 80

## 2021-11-08 MED ORDER — FUROSEMIDE 10 MG/ML IJ SOLN
20.0000 mg | Freq: Once | INTRAMUSCULAR | Status: AC
  Administered 2021-11-08: 20 mg via INTRAVENOUS
  Filled 2021-11-08: qty 2

## 2021-11-08 MED ORDER — PANTOPRAZOLE INFUSION (NEW) - SIMPLE MED
8.0000 mg/h | INTRAVENOUS | Status: DC
  Administered 2021-11-08 – 2021-11-09 (×4): 8 mg/h via INTRAVENOUS
  Filled 2021-11-08 (×3): qty 80
  Filled 2021-11-08: qty 100

## 2021-11-08 MED ORDER — METOPROLOL SUCCINATE ER 25 MG PO TB24
12.5000 mg | ORAL_TABLET | Freq: Every day | ORAL | Status: DC
  Administered 2021-11-09 – 2021-11-12 (×4): 12.5 mg via ORAL
  Filled 2021-11-08 (×5): qty 1

## 2021-11-08 MED ORDER — VITAMIN K1 10 MG/ML IJ SOLN
2.5000 mg | Freq: Once | INTRAVENOUS | Status: AC
  Administered 2021-11-08: 2.5 mg via INTRAVENOUS
  Filled 2021-11-08: qty 0.25

## 2021-11-08 NOTE — TOC Initial Note (Signed)
Transition of Care [facility]) - Initial/Assessment Note    Patient Details  Name: [name] MRN: [id number] Date of Birth: [birth year]  [facility] CM/SW Contact:    Verdell Carmine, RN Phone Number: 11/08/2021, 5:52 PM  Clinical Narrative:                    The Transition of Care Department [facility]) has reviewed patient and no TOC needs have been identified at this time. We will continue to monitor patient advancement through interdisciplinary progression rounds. If new patient transition needs arise, please place a TOC consult     Patient Goals and CMS Choice        Expected Discharge Plan and Services                                                Prior Living Arrangements/Services                       Activities of Daily Living Home Assistive Devices/Equipment: None ADL Screening (condition at time of admission) Patient's cognitive ability adequate to safely complete daily activities?: Yes Is the patient deaf or have difficulty hearing?: No Does the patient have difficulty seeing, even when wearing glasses/contacts?: No Does the patient have difficulty concentrating, remembering, or making decisions?: No Patient able to express need for assistance with ADLs?: Yes Does the patient have difficulty dressing or bathing?: No Independently performs ADLs?: Yes (appropriate for developmental age) Does the patient have difficulty walking or climbing stairs?: No Weakness of Legs: None Weakness of Arms/Hands: None  Permission Sought/Granted                  Emotional Assessment              Admission diagnosis:  Acute upper GI bleeding [K92.2] Acute GI bleeding [K92.2] Symptomatic anemia [D64.9] Patient Active Problem List   Diagnosis Date Noted   Acute upper GI bleeding 11/08/2021   Acute GI bleeding 03/01/2021   Coagulopathy ([place]) 03/01/2021   Hematochezia    Acute blood loss anemia    Supratherapeutic INR     Status post reverse total shoulder replacement, left 10/27/2019   ICD (implantable cardioverter-defibrillator) lead failure 07/09/2018   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   Accelerated junctional rhythm 01/29/2018   Peptic ulcer disease    Pacemaker    Neuromuscular disorder ([place])    MVP (mitral valve prolapse)    IBS (irritable bowel syndrome)    Hypothyroidism    Hemorrhoids    Hypertension    Mixed hyperlipidemia    Heart murmur    H/O mitral valve replacement    Gout    GERD (gastroesophageal reflux disease)    Fibromyalgia    Dysrhythmia    Diverticulosis    Depression    COPD (chronic obstructive pulmonary disease) ([place])    Complication of anesthesia    Colon polyp    Chronic kidney disease    CHF (congestive heart failure) ([place])    Cataract    Bursitis    Asthma    Arthritis    Anxiety    Anemia    Allergy    Pacemaker battery depletion 04/28/2017   Biventricular automatic implantable cardioverter defibrillator at end of life 04/28/2017   Malnutrition of moderate degree ([place]: 60% to  less than 75% of standard weight) ([place]) 03/30/2017   CKD (chronic kidney disease) stage 3, GFR 30-59 ml/min (HCC) 03/30/2017   Mild protein-calorie malnutrition ([place]) 09/22/2016   Chronic diastolic (congestive) heart failure (HCC) 03/25/2016   Paroxysmal atrial fibrillation ([place]) 03/25/2016   S/P shoulder replacement 12/27/2015   Cholelithiasis with cholecystitis 10/28/2013   Long term (current) use of anticoagulants 08/19/2012   Biventricular ICD (implantable cardioverter-defibrillator) in place 04/10/2011   History of mitral valve replacement with mechanical valve 04/10/2011   CHEST PAIN 01/24/2010   Automatic implantable cardioverter-defibrillator in situ 01/24/2010   RECTAL BLEEDING 08/01/2009   NAUSEA AND VOMITING 08/01/2009   UTI'S, HX OF 08/01/2009   PERSONAL HX COLONIC POLYPS 08/01/2008   HYPERLIPIDEMIA 07/31/2008   ANEMIA, CHRONIC 07/31/2008   INTERNAL  HEMORRHOIDS 07/31/2008   GERD 07/31/2008   DIVERTICULOSIS, COLON 07/31/2008   BURSITIS 07/31/2008   PCP:  [name], MD Pharmacy:   [id number] [place] [address] [id number]: [phone]: [phone]    Social Determinants of Health (SDOH) Interventions    Readmission Risk Interventions     No data to display

## 2021-11-08 NOTE — Consult Note (Addendum)
The patient has been seen in conjunction with Cadence Kathlen Mody, PAC. All aspects of care have been considered and discussed. The patient has been personally interviewed, examined, and all clinical data has been reviewed.  Severe coagulopathy, INR greater than 10, and the patient on Coumadin for mechanical mitral valve. 4-day history of melena with hemoglobin of 4.5. Discontinue Coumadin, vitamin K 2.5 mg IV.  INR in 6 hours and repeat an INR in AM.  Additional vitamin K may be necessary. Agree with transfusion.  If hemodynamic instability or bright red bleeding, fresh frozen plasma should be given.   Cardiology Consultation:   Patient ID: [name] MRN: [id number]; DOB: 1941-05-01  Admit date: 11/08/2021 Date of Consult: 11/08/2021  PCP:  [name], MD   [facility] Providers Cardiologist:  Quay Burow, MD  Electrophysiologist:  Sanda Klein, MD  {   Patient Profile:   [name] is a 81 y.o. 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 Candiss Norse.  History of Present Illness:   Ms. [place] is followed by Dr. Gwenlyn Found for the above cardiac issues. She has a history of NICM s/p mitral valve repair in 2002 followed by redo replacement with aSt. [name] MVR by Dr. Cyndia Bent in 2007. She had normal coronary arteris. EF improved initially from 10% to normal with cardiac resynchronization therapy. PPM is followed by Dr. Recardo Evangelist. She underwent generator change out November 20, 201 and is followed by [facility] quarterly. Echo 2017 showed normal EF with normal functioning mitral mechanical prosthesis.   She had a bi-V generator change by Dr. Loletha Grayer 04/2017 after having Lovenox bridge for her mitral valve.  Echo 07/2017 showed LVEF 50-55% with a well-functioning mitral mechanical prosthesis.  She was last seen 10/09/21 and was overall doing well. She was following with the coumadin clinic.   INR on 5/5 was 3  The patient presented to the ER 6/9 for dark stools. She noted this about 5 days ago. Also noted SOB and weakness. She had low appetite and vomiting. She couldn't keep anything down. When the dark stools persisted she came to the ER. Last does of coumadin was 6/8 pm.  In the ER Hgb came back at 4.6 and she was ordered 3 units PRBCs. Coumadin held. INR came back at >10 and 5 units K were ordered. CMP showed sodium 134, K 3.8, CO2 22, Scr 1.70, BUN 61, calcium 8.1, albumin 2.5. WBC 11.0. The patient was admitted for further work-up.   Past Medical History:  Diagnosis Date   Allergy    seasonal   Anemia    Anxiety    Arthritis    Asthma    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 Protecta   Biventricular ICD (implantable cardioverter-defibrillator) in place    with CRT   Blood transfusion without reported diagnosis    Bursitis    Cataract    RIGHT EYE   CHF (congestive heart failure) ([place])    Chronic kidney disease    Colon polyp    adenomatous   Complication of anesthesia    patient stated that had difficulty getting the breathing tube removed, patient said that she stopped breathing and HR dropped to 10  patient then  woke up and started breathing pateint stated no longer than one minute; re-intubated in PACU following cholecystectomy 10/28/13   COPD (chronic obstructive pulmonary disease) ([place])    Depression    Diverticulosis    Dysrhythmia    Fibromyalgia    GERD (gastroesophageal reflux disease)    Gout    H/O mitral valve replacement 2002, 2007   Heart murmur    Hemorrhoids    Hyperlipidemia    Hypertension    Hypothyroidism    IBS (irritable bowel syndrome)    MVP (mitral valve prolapse)    Neuromuscular disorder (HCC)    fibromyalgia    Pacemaker    Peptic ulcer disease     Past Surgical History:  Procedure Laterality Date   ABDOMINAL HYSTERECTOMY     BIV ICD GENERATOR CHANGEOUT N/A 04/29/2017   Procedure: BIV ICD GENERATOR CHANGEOUT;  Surgeon: Sanda Klein, MD;  Location: [facility];  Service: Cardiovascular;  Laterality: N/A;   CARDIAC CATHETERIZATION  02/25/2006   normal left main, normal LAD, normal L Cfx, normal/dominant RCA (Dr. [name])   CARDIAC DEFIBRILLATOR PLACEMENT  2007, 11/202012   x2 (pacemaker) (Dr. Jerilynn Mages. Croitoru)   [place]  2002   MV repair - Dr. Berle Mull   CHOLECYSTECTOMY N/A 10/28/2013   Procedure: LAPAROSCOPIC CHOLECYSTECTOMY WITH INTRAOPERATIVE CHOLANGIOGRAM;  Surgeon: Edward Jolly, MD;  Location: [place];  Service: General;  Laterality: N/A;   COLONOSCOPY     ESOPHAGOGASTRODUODENOSCOPY (EGD) WITH PROPOFOL N/A 03/02/2021   Procedure: ESOPHAGOGASTRODUODENOSCOPY (EGD) WITH PROPOFOL;  Surgeon: Daryel November, MD;  Location: [place];  Service: Gastroenterology;  Laterality: N/A;   EYE SURGERY     HEMOSTASIS CLIP PLACEMENT  03/02/2021   Procedure: HEMOSTASIS CLIP PLACEMENT;  Surgeon: Daryel November, MD;  Location: [facility] END[facility]COPY;  Service: Gastroenterology;;   HOT HEMOSTASIS  03/02/2021   Procedure: HOT HEMOSTASIS (ARGON PLASMA COAGULATION/BICAP);  Surgeon: Daryel November, MD;  Location: [place];  Service: Gastroenterology;;   IMPLANTABLE CARDIOVERTER DEFIBRILLATOR (ICD) GENERATOR CHANGE N/A 04/10/2011   Procedure: ICD GENERATOR CHANGE;  Surgeon: Sanda Klein, MD;  Location: [facility];  Service: Cardiovascular;  Laterality: N/A;   INSERT / REPLACE / REMOVE PACEMAKER     KNEE ARTHROSCOPY     MITRAL VALVE REPLACEMENT  02/26/2006   re-do MVR w/57m St. [name] (Dr. BEllison Hughs   [place] 2005   persantine myoview - low ris, EF 63%   RIGHT HEART CATH  04/03/2006   pulm cap wedge pressure 24/24, PA pressure 43/22 (mean 365mg), CO  4.8, CI 4.1 (Dr. J.Jackie Plum  [place]/06/[place]   Procedure: SCClide Deutscher Surgeon: CuDaryel NovemberMD;  [facility];  Service: Gastroenterology;;   TOTAL SHOULDER ARTHROPLASTY Right 12/27/2015   Procedure: RIGHT TOTAL SHOULDER ARTHROPLASTY;  Surgeon: JuTania AdeMD;  [place] [facility] Service: [place]opedics;  Laterality: Right;  Right total shoulder arthroplasty   TOTAL SHOULDER ARTHROPLASTY Left 10/27/2019   Procedure: REVERSE TOTAL SHOULDER ARTHROPLASTY;  Surgeon: ChTania AdeMD;  Location: [facility];  Service: Orthopedics;  Laterality: Left;   TRANSTHORACIC ECHOCARDIOGRAM  12/2011   EF 50-55%, mild global hypokinesis; LA severely dilated; calcification of anterior/posterior MV leaflets, bi-leaflets St. [name] mechanical MV; mild TR; trace AV regurg/pulm valve regurg     Home Medications:  Prior to Admission medications   Medication Sig Start Date End Date Taking? Authorizing Provider  acetaminophen (TYLENOL) 650 MG CR tablet Take 650 mg  by mouth every 8 (eight) hours as needed for pain.   Yes [provider]  allopurinol (ZYLOPRIM) 300 MG tablet Take 300 mg by mouth every evening.    Yes [provider]  ALPRAZolam (XANAX) 0.25 MG tablet Take 0.25 mg by mouth at bedtime as needed for anxiety or sleep.    Yes [provider]  antiseptic oral rinse (BIOTENE) LIQD 15 mLs by Mouth Rinse route as needed for dry mouth.   Yes [provider]  buPROPion (WELLBUTRIN SR) 150 MG 12 hr tablet Take 150 mg by mouth in the morning.   Yes [provider]  esomeprazole (NEXIUM) 20 MG capsule Take 1 capsule (20 mg total) by mouth 2 (two) times daily before a meal. Patient taking differently: Take 20 mg by mouth daily at 12 noon. 03/05/21  Yes Nita Sells, MD  fluticasone (FLONASE) 50 MCG/ACT nasal spray Place 1 spray into both nostrils as needed for allergies.   Yes [provider]  furosemide (LASIX) 40 MG tablet TAKE 1  TABLET BY MOUTH DAILY. **NEEDS OV FOR FUTURE REFILLS** Patient taking differently: Take 40 mg by mouth daily. 03/21/21  Yes Lorretta Harp, MD  icosapent Ethyl (VASCEPA) 1 g capsule TAKE 2 CAPSULES BY MOUTH 2 TIMES DAILY. Patient taking differently: Take 2 g by mouth daily. 08/29/21  Yes Lorretta Harp, MD  metoCLOPramide (REGLAN) 5 MG tablet Take 5 mg by mouth 2 (two) times daily as needed for nausea or vomiting. 11/05/21  Yes [provider]  metoprolol succinate (TOPROL-XL) 25 MG 24 hr tablet Take 25 mg in the morning and 50 mg (two tablets) in the evening 11/07/21  Yes Croitoru, Mihai, MD  Multiple Vitamin (MULTIVITAMIN WITH MINERALS) TABS tablet Take 1 tablet by mouth daily.   Yes [provider]  phenylephrine (SUDAFED PE) 10 MG TABS tablet Take 10 mg by mouth at bedtime as needed (Congestion).   Yes [provider]  pravastatin (PRAVACHOL) 40 MG tablet Take 40 mg at bedtime by mouth.    Yes [provider]  PROAIR HFA 108 (90 BASE) MCG/ACT inhaler Inhale 1-2 puffs into the lungs every 6 (six) hours as needed for wheezing. 01/23/15  Yes [provider]  Sennosides (SENNA) 15 MG TABS Take 30 mg by mouth at bedtime.   Yes [provider]  spironolactone (ALDACTONE) 25 MG tablet Take 1 tablet (25 mg total) by mouth daily. 08/19/21  Yes Lorretta Harp, MD  SYNTHROID 50 MCG tablet Take 50 mcg by mouth at bedtime. 06/05/16  Yes [provider]  VOLTAREN 1 % GEL Apply 2-4 g topically in the morning and at bedtime.   Yes [provider]  warfarin (COUMADIN) 2.5 MG tablet Take 1-2 tablets (2.5-5 mg total) by mouth See admin instructions. Take 5 mg by mouth after 6 PM on Sun/Tues/Wed/Thurs/Sat and 2.5 mg on Mon/Fri Patient taking differently: Take 2.5-5 mg by mouth See admin instructions. Take 1 tablet (2.5 mg) on Monday, Wednesday, Friday Then take 2 tablets (5 mg) on Sunday, Tuesday, Thursday and Saturday or as directed by coumadin  clinic 07/05/21  Yes Lorretta Harp, MD  warfarin (COUMADIN) 5 MG tablet Take this dose for 3 days and have your INR rechecked on Friday 10/7 and consult ur pharmacist for further instructions Patient not taking: Reported on 11/08/2021 03/05/21   Nita Sells, MD    Inpatient Medications: Scheduled Meds:  furosemide  20 mg Intravenous Once   [START ON 11/09/2021] levothyroxine  50 mcg Oral QAC breakfast   [START ON 11/09/2021] metoprolol succinate  12.5 mg Oral Daily   pravastatin  40 mg Oral QHS   Continuous Infusions:  sodium chloride     sodium chloride     pantoprazole 8 mg/hr (11/08/21 0826)   phytonadione (VITAMIN K) 5 mg in dextrose 5 % 50 mL IVPB     PRN Meds: acetaminophen **OR** acetaminophen, ALPRAZolam, ondansetron **OR** ondansetron (ZOFRAN) IV, polyethylene glycol  Allergies:    Allergies  Allergen Reactions   Ace Inhibitors Cough   Lipitor [Atorvastatin Calcium] Other (See Comments)    Knee pain    Megace [Megestrol] Other (See Comments)    High blood pressure and blurred vision   Aspirin Other (See Comments)    Nephrologist said to not take this   Clorazepate Dipotassium Other (See Comments)    Interacts with a drug being taken   Simvastatin Other (See Comments)    Muscle pain   Tiotropium Bromide Monohydrate Other (See Comments)    Dry mouth   Codeine Nausea And Vomiting   Tape Rash    Social History:   Social History   Socioeconomic History   Marital status: Married    Spouse name: [name]   Number of children: 4   Years of education: 12   Highest education level: Not on file  Occupational History   Occupation: retired  Tobacco Use   Smoking status: Former    Types: Cigarettes    Quit date: 06/02/2001    Years since quitting: 20.4   Smokeless tobacco: Never  Vaping Use   Vaping Use: Never used  Substance and Sexual Activity   Alcohol use: Yes    Alcohol/week: 0.0 standard drinks of alcohol    Comment: social-1 every 6 months   Drug  use: No   Sexual activity: Not on file  Other Topics Concern   Not on file  Social History Narrative   Drinks very little caffeine    Social Determinants of Health   Financial Resource Strain: Not on file  Food Insecurity: Not on file  Transportation Needs: Not on file  Physical Activity: Not on file  Stress: Not on file  Social Connections: Not on file  Intimate Partner Violence: Not on file    Family History:    Family History  Problem Relation Age of Onset   Breast cancer Sister        multiple   Uterine cancer Sister        multiple   Heart disease Mother    Heart attack Mother    Heart disease Father    Kidney disease Father    Heart attack Father    Heart disease Sister    Crohn's disease Sister    Heart disease Son    Colon cancer Neg Hx    Stomach cancer Neg Hx      ROS:  Please see the history of present illness.   All other ROS reviewed and negative.     Physical Exam/Data:   Vitals:   11/08/21 1038 11/08/21 1045 11/08/21 1100 11/08/21 1115  BP: (!) 115/49 (!) 110/42 (!) 121/43 (!) 126/44  Pulse: 86 83 82 84  Resp: (!) 26 16 (!) 22 17  Temp: 98.1 F (36.7 C)     TempSrc: Oral     SpO2: 100% 98% 100% 100%    Intake/Output Summary (Last 24 hours) at 11/08/2021 1138 Last data filed at 11/08/2021 0904 Gross per 24 hour  Intake  100 ml  Output --  Net 100 ml      11/07/2021    9:22 AM 10/09/2021    9:20 AM 05/16/2021    9:31 AM  Last 3 Weights  Weight (lbs) 98 lb 3.2 oz 103 lb 12.8 oz 94 lb  Weight (kg) 44.543 kg 47.083 kg 42.638 kg     There is no height or weight on file to calculate BMI.  General:  Well nourished, well developed, in no acute distress HEENT: normal Neck: no JVD Vascular: No carotid bruits; Distal pulses 2+ bilaterally Cardiac:  normal S1, S2; RRR; +murmur  Lungs:  clear to auscultation bilaterally, no wheezing, rhonchi or rales  Abd: soft, nontender, no hepatomegaly  Ext: no edema Musculoskeletal:  No deformities, BUE and  BLE strength normal and equal Skin: warm and dry  Neuro:  CNs 2-12 intact, no focal abnormalities noted Psych:  Normal affect   EKG:  The EKG was personally reviewed and demonstrates: A V Paced rhythm  Relevant CV Studies:  Echo 10/30/21  1. Left ventricular ejection fraction, by estimation, is 30 to 35%. The  left ventricle has moderately decreased function. The left ventricle  demonstrates global hypokinesis. Left ventricular diastolic parameters are  indeterminate.   2. Right ventricular systolic function is normal. The right ventricular  size is normal. Tricuspid regurgitation signal is inadequate for assessing  PA pressure.   3. Left atrial size was mildly dilated.   4. The mitral valve has been repaired/replaced. Trivial mitral valve  regurgitation. The mean mitral valve gradient is 3.0 mmHg with average  heart rate of 70 bpm. There is a 25 mm St. [name] mechanical valve present  in the mitral position. Procedure Date:  02/26/2006.   5. The aortic valve is tricuspid. Aortic valve regurgitation is mild.  Aortic valve sclerosis is present, with no evidence of aortic valve  stenosis.   6. The inferior vena cava is normal in size with greater than 50%  respiratory variability, suggesting right atrial pressure of 3 mmHg.     Laboratory Data:  High Sensitivity Troponin:   Recent Labs  Lab 11/08/21 0848  TROPONINIHS 11     Chemistry Recent Labs  Lab 11/08/21 0716  NA 134*  K 3.8  CL 101  CO2 22  GLUCOSE 148*  BUN 61*  CREATININE 1.70*  CALCIUM 8.1*  GFRNONAA 30*  ANIONGAP 11    Recent Labs  Lab 11/08/21 0716  PROT 5.2*  ALBUMIN 2.5*  AST 23  ALT 16  ALKPHOS 47  BILITOT 0.5   Lipids No results for input(s): "CHOL", "TRIG", "HDL", "LABVLDL", "LDLCALC", "CHOLHDL" in the last 168 hours.  Hematology Recent Labs  Lab 11/08/21 0716  WBC 11.0*  RBC 1.48*  HGB 4.6*  HCT 14.4*  MCV 97.3  MCH 31.1  MCHC 31.9  RDW 15.8*  PLT 233   Thyroid No results for  input(s): "TSH", "FREET4" in the last 168 hours.  BNPNo results for input(s): "BNP", "PROBNP" in the last 168 hours.  DDimer No results for input(s): "DDIMER" in the last 168 hours.   Radiology/Studies:  DG Chest Portable 1 View  Result Date: 11/08/2021 CLINICAL DATA:  SOB anemia EXAM: PORTABLE CHEST 1 VIEW COMPARISON:  Oct 20, 2019 FINDINGS: As before, there are left subclavian cardiac pacers with lead tip over the right atrium and right ventricle. Epicardial pacing wires noted. Again seen are sternotomy wires and cardiac valve replacement. No cardiomegaly. Mediastinal contours are within normal limits. No focal consolidation  or pleural effusion. There is mild central pulmonary vascular dilatation seen. 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 There has been interval left shoulder prosthesis with apparent minimal widening at the prosthetic glenohumeral joint. IMPRESSION: Mild central pulmonary vascular congestion. No pleural effusion or consolidation. There has been interval left shoulder prosthesis with apparent minimal widening at the prosthetic glenohumeral joint. Clinical correlation is suggested. Electronically Signed   By: Frazier Richards M.D.   On: 11/08/2021 09:47     Assessment and Plan:   Acute GIB - presented with 4-5 days of dark stools. Hgb 4.6 on admission s/p 3 units PRBs ordered - She has MV replacement on coumadin>>this was held. INR came back at >10, s/p Vit K ordered - IV PPI - GI consulted  H/o St. [name] Mitral valve replacement in 2007 on coumadin - INR 10 and 5 units Vitamin K ordered>this has not been given yet - we will dc the 59m Vit k and give 2.572m - Goal INR 2.5-3.5 - check INR in 6 hours and daily AM INR check  - restart IV heparin when INR at goal  Biv heart failure PPM/defibrillator - Echo 10/30/21 showed LVEF 30-35%, normal RVSF, normal functioning mitral valve,mild AI - Euvolemic on exam - PTA lasix 4040maily, toprol 2m48mily, spironolactone 2mg82mily - spiro held for AKI - PPM/defib followed by Dr. CroitRecardo EvangelistP  AKI - Scr 1.70, BUN 61 - in the setting of the above  Pafib - PTA coumadin, plan as above - Afib burden <0.1% on most recent interrogation  For questions or updates, please contact [facility] Please consult [email or web address] for contact info under    Signed, Cadence H FurNinfa MeekerC  11/08/2021 11:38 AM

## 2021-11-08 NOTE — H&P (View-Only) (Signed)
Referring Provider: EDP, Dr. Wyvonnia Dusky Primary Care Physician:  [name], MD Primary Gastroenterologist:  Dr. Henrene Pastor  Reason for Consultation:  GI bleed  HPI: [name] is a 81 y.o. 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  She says that the black stools began on Sunday evening.  She took some senna for constipation and once she started having bowel movements they were pitch black in color.  She has periodic vomiting due to sinus drainage/postnasal drip.  She has not seen any sign of blood in her emesis.  Hemoglobin is 4.6 g down from 9.2 g 3 months ago.  BUN is elevated at 61.  INR >10.  EGD 03/2021:  - The examined portions of the nasopharynx, oropharynx and larynx were normal. - Normal esophagus. - 3 cm hiatal hernia. - Pale mucosa in the stomach. - Blood in the duodenal bulb and in the second portion of the duodenum. 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 Injected. Clips (MR conditional) were placed. Treated with argon plasma coagulation (APC). - No specimens collected.  Past Medical History:  Diagnosis Date   Allergy    seasonal   Anemia    Anxiety    Arthritis    Asthma    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 Protecta   Biventricular ICD (implantable cardioverter-defibrillator) in place    with CRT   Blood transfusion without reported diagnosis    Bursitis    Cataract    RIGHT EYE   CHF (congestive heart failure) ([place])    Chronic kidney disease    Colon polyp    adenomatous   Complication of anesthesia    patient stated that had difficulty getting the breathing tube removed, patient said that  she stopped breathing and HR dropped to 10  patient then woke up and started breathing pateint stated no longer than one minute; re-intubated in PACU following cholecystectomy 10/28/13   COPD (chronic obstructive pulmonary disease) ([place])    Depression    Diverticulosis    Dysrhythmia    Fibromyalgia    GERD (gastroesophageal reflux disease)    Gout    H/O mitral valve replacement 2002, 2007   Heart murmur    Hemorrhoids    Hyperlipidemia    Hypertension    Hypothyroidism    IBS (irritable bowel syndrome)    MVP (mitral valve prolapse)    Neuromuscular disorder ([place])    fibromyalgia   Pacemaker    Peptic ulcer disease     Past Surgical History:  Procedure Laterality Date   ABDOMINAL HYSTERECTOMY     BIV ICD GENERATOR CHANGEOUT N/A 04/29/2017   Procedure: BIV ICD GENERATOR CHANGEOUT;  Surgeon: Sanda Klein, MD;  Location: [place] [facility];  Service: Cardiovascular;  Laterality: N/A;   CARDIAC CATHETERIZATION  02/25/2006   normal left main, normal LAD, normal L Cfx, normal/dominant RCA (Dr. [name])   [place]  2007, 11/202012   x2 (pacemaker) (Dr. Jerilynn Mages. Croitoru)   [place]  2002   MV repair - Dr. Berle Mull   CHOLECYSTECTOMY N/A 10/28/2013   Procedure: LAPAROSCOPIC CHOLECYSTECTOMY WITH INTRAOPERATIVE CHOLANGIOGRAM;  Surgeon: Edward Jolly, MD;  Location: [facility];  Service: General;  Laterality: N/A;   COLONOSCOPY     ESOPHAGOGASTRODUODENOSCOPY (EGD) WITH PROPOFOL N/A 03/02/2021   Procedure: ESOPHAGOGASTRODUODENOSCOPY (EGD) WITH PROPOFOL;  Surgeon: Daryel November, MD;  Location: [place];  Service: Gastroenterology;  Laterality: N/A;   EYE SURGERY     HEMOSTASIS CLIP PLACEMENT  03/02/2021   Procedure: HEMOSTASIS CLIP PLACEMENT;  Surgeon: Daryel November, MD;  Location: [facility] ENDOSCOPY;  Service: Gastroenterology;;   HOT HEMOSTASIS  03/02/2021   Procedure: HOT HEMOSTASIS (ARGON PLASMA COAGULATION/BICAP);  Surgeon:  Daryel November, MD;  Location: [place];  Service: Gastroenterology;;   IMPLANTABLE CARDIOVERTER DEFIBRILLATOR (ICD) GENERATOR CHANGE N/A 04/10/2011   Procedure: ICD GENERATOR CHANGE;  Surgeon: Sanda Klein, MD;  Location: [place] CATH LAB;  Service: Cardiovascular;  Laterality: N/A;   INSERT / REPLACE / REMOVE PACEMAKER     KNEE ARTHROSCOPY     MITRAL VALVE REPLACEMENT  02/26/2006   re-do MVR w/31m St. [name] (Dr. BEllison Hughs   [address]   persantine myoview - low ris, EF 63%   RIGHT HEART CATH  04/03/2006   pulm cap wedge pressure 24/24, PA pressure 43/22 (mean 327mg), CO 4.8, CI 4.1 (Dr. J.Jackie Plum  [facility]   Procedure: SCClide Deutscher Surgeon: CuDaryel NovemberMD;  [facility];  Service: Gastroenterology;;   TOTAL SHOULDER ARTHROPLASTY Right 12/27/2015   Procedure: RIGHT TOTAL SHOULDER ARTHROPLASTY;  Surgeon: JuTania AdeMD;  [facility] Service: Orthopedics;  Laterality: Right;  Right total shoulder arthroplasty   TOTAL SHOULDER ARTHROPLASTY Left 10/27/2019   Procedure: REVERSE TOTAL SHOULDER ARTHROPLASTY;  Surgeon: ChTania AdeMD;  Location: [facility];  Service: Orthopedics;  Laterality: Left;   TRANSTHORACIC ECHOCARDIOGRAM  12/2011   EF 50-55%, mild global hypokinesis; LA severely dilated; calcification of anterior/posterior MV leaflets, bi-leaflets St. [name] mechanical MV; mild TR; trace AV regurg/pulm valve regurg    Prior to Admission medications   Medication Sig Start Date End Date Taking? Authorizing Provider  acetaminophen (TYLENOL) 650 MG CR tablet Take 650 mg by mouth every 8 (eight) hours as needed for pain.    [provider]  allopurinol (ZYLOPRIM) 300 MG tablet Take 300 mg by mouth every evening.     [provider]  ALPRAZolam (XDuanne Moron0.25 MG tablet Take 0.25 mg by mouth at bedtime as needed for anxiety or sleep.     [provider]  antiseptic oral rinse (BIOTENE) LIQD 15 mLs  by Mouth Rinse route as needed for dry mouth. Patient not taking: Reported on 11/07/2021    [provider]  Ascorbic Acid (VITA-C PO) Take 3 capsules by mouth daily. Patient not taking: Reported on 11/07/2021    [provider]  buPROPion (WELLBUTRIN SR) 150 MG 12 hr tablet Take 150 mg by mouth in the morning.    [provider]  esomeprazole (NEXIUM) 20 MG capsule Take 1 capsule (20 mg total) by mouth 2 (two) times daily before a meal. 03/05/21   SaNita SellsMD  fluticasone (FLONASE) 50 MCG/ACT nasal spray Place 1 spray into both nostrils as needed for allergies. Patient not taking: Reported on 11/07/2021    [provider]  furosemide (LASIX) 40 MG tablet TAKE 1 TABLET BY MOUTH DAILY. **NEEDS OV FOR FUTURE REFILLS** 03/21/21   BeLorretta HarpMD  icosapent Ethyl (VASCEPA) 1 g capsule TAKE 2 CAPSULES BY MOUTH 2 TIMES DAILY. Patient not taking: Reported on 11/07/2021 08/29/21   BeQuay Burow  J, MD  metoCLOPramide (REGLAN) 5 MG tablet Take 5 mg by mouth 2 (two) times daily as needed. 11/05/21   [provider]  metoprolol succinate (TOPROL-XL) 25 MG 24 hr tablet Take 25 mg in the morning and 50 mg (two tablets) in the evening 11/07/21   Croitoru, Mihai, MD  Multiple Vitamin (MULTIVITAMIN WITH MINERALS) TABS tablet Take 1 tablet by mouth daily.    [provider]  pravastatin (PRAVACHOL) 40 MG tablet Take 40 mg at bedtime by mouth.     [provider]  PROAIR HFA 108 (90 BASE) MCG/ACT inhaler Inhale 1-2 puffs into the lungs every 6 (six) hours as needed for wheezing.  Patient not taking: Reported on 11/07/2021 01/23/15   [provider]  Sennosides (SENNA) 15 MG TABS Take 15 mg by mouth at bedtime.     [provider]  spironolactone (ALDACTONE) 25 MG tablet Take 1 tablet (25 mg total) by mouth daily. 08/19/21   Lorretta Harp, MD  SYNTHROID 50 MCG tablet Take 50 mcg by mouth daily before breakfast. Patient not  taking: Reported on 11/07/2021 06/05/16   [provider]  VOLTAREN 1 % GEL Apply 2-4 g topically in the morning and at bedtime.    [provider]  warfarin (COUMADIN) 2.5 MG tablet Take 1-2 tablets (2.5-5 mg total) by mouth See admin instructions. Take 5 mg by mouth after 6 PM on Sun/Tues/Wed/Thurs/Sat and 2.5 mg on Mon/Fri Patient taking differently: Take 2.5 mg by mouth See admin instructions. Take on Monday, Tuesday, and Friday 07/05/21   Lorretta Harp, MD  warfarin (COUMADIN) 5 MG tablet Take this dose for 3 days and have your INR rechecked on Friday 10/7 and consult ur pharmacist for further instructions Patient not taking: Reported on 11/07/2021 03/05/21   Nita Sells, MD    Current Facility-Administered Medications  Medication Dose Route Frequency Provider Last Rate Last Admin   0.9 %  sodium chloride infusion  500 mL Intravenous Continuous Irene Shipper, MD       0.9 %  sodium chloride infusion  10 mL/hr Intravenous Once Rancour, Stephen, MD       0.9 %  sodium chloride infusion  10 mL/hr Intravenous Once Morelli, Brandon A, PA-C       pantoprozole (PROTONIX) 80 mg /NS 100 mL infusion  8 mg/hr Intravenous Continuous Rancour, Stephen, MD 10 mL/hr at 11/08/21 0826 8 mg/hr at 11/08/21 1607   Current Outpatient Medications  Medication Sig Dispense Refill   acetaminophen (TYLENOL) 650 MG CR tablet Take 650 mg by mouth every 8 (eight) hours as needed for pain.     allopurinol (ZYLOPRIM) 300 MG tablet Take 300 mg by mouth every evening.      ALPRAZolam (XANAX) 0.25 MG tablet Take 0.25 mg by mouth at bedtime as needed for anxiety or sleep.      antiseptic oral rinse (BIOTENE) LIQD 15 mLs by Mouth Rinse route as needed for dry mouth. (Patient not taking: Reported on 11/07/2021)     Ascorbic Acid (VITA-C PO) Take 3 capsules by mouth daily. (Patient not taking: Reported on 11/07/2021)     buPROPion (WELLBUTRIN SR) 150 MG 12 hr tablet Take 150 mg by mouth in the morning.      esomeprazole (NEXIUM) 20 MG capsule Take 1 capsule (20 mg total) by mouth 2 (two) times daily before a meal. 60 capsule 0   fluticasone (FLONASE) 50 MCG/ACT nasal spray Place 1 spray into both nostrils as needed  for allergies. (Patient not taking: Reported on 11/07/2021)     furosemide (LASIX) 40 MG tablet TAKE 1 TABLET BY MOUTH DAILY. **NEEDS OV FOR FUTURE REFILLS** 90 tablet 2   icosapent Ethyl (VASCEPA) 1 g capsule TAKE 2 CAPSULES BY MOUTH 2 TIMES DAILY. (Patient not taking: Reported on 11/07/2021) 120 capsule 11   metoCLOPramide (REGLAN) 5 MG tablet Take 5 mg by mouth 2 (two) times daily as needed.     metoprolol succinate (TOPROL-XL) 25 MG 24 hr tablet Take 25 mg in the morning and 50 mg (two tablets) in the evening 90 tablet 5   Multiple Vitamin (MULTIVITAMIN WITH MINERALS) TABS tablet Take 1 tablet by mouth daily.     pravastatin (PRAVACHOL) 40 MG tablet Take 40 mg at bedtime by mouth.      PROAIR HFA 108 (90 BASE) MCG/ACT inhaler Inhale 1-2 puffs into the lungs every 6 (six) hours as needed for wheezing.  (Patient not taking: Reported on 11/07/2021)     Sennosides (SENNA) 15 MG TABS Take 15 mg by mouth at bedtime.      spironolactone (ALDACTONE) 25 MG tablet Take 1 tablet (25 mg total) by mouth daily. 90 tablet 3   SYNTHROID 50 MCG tablet Take 50 mcg by mouth daily before breakfast. (Patient not taking: Reported on 11/07/2021)  2   VOLTAREN 1 % GEL Apply 2-4 g topically in the morning and at bedtime.     warfarin (COUMADIN) 2.5 MG tablet Take 1-2 tablets (2.5-5 mg total) by mouth See admin instructions. Take 5 mg by mouth after 6 PM on Sun/Tues/Wed/Thurs/Sat and 2.5 mg on Mon/Fri (Patient taking differently: Take 2.5 mg by mouth See admin instructions. Take on Monday, Tuesday, and Friday) 135 tablet 1   warfarin (COUMADIN) 5 MG tablet Take this dose for 3 days and have your INR rechecked on Friday 10/7 and consult ur pharmacist for further instructions (Patient not taking: Reported on 11/07/2021)       Allergies as of 11/08/2021 - Review Complete 11/08/2021  Allergen Reaction Noted   Ace inhibitors Cough 08/01/2008   Lipitor [atorvastatin calcium] Other (See Comments) 04/07/2011   Megace [megestrol] Other (See Comments) 05/16/2021   Aspirin Other (See Comments) 02/11/2021   Clorazepate dipotassium Other (See Comments) 02/11/2021   Simvastatin Other (See Comments) 02/11/2021   Tiotropium bromide monohydrate Other (See Comments) 02/11/2021   Codeine Nausea And Vomiting 01/19/2014   Tape Rash 08/01/2021    Family History  Problem Relation Age of Onset   Breast cancer Sister        multiple   Uterine cancer Sister        multiple   Heart disease Mother    Heart attack Mother    Heart disease Father    Kidney disease Father    Heart attack Father    Heart disease Sister    Crohn's disease Sister    Heart disease Son    Colon cancer Neg Hx    Stomach cancer Neg Hx     Social History   Socioeconomic History   Marital status: Married    Spouse name: [name]   Number of children: 4   Years of education: 12   Highest education level: Not on file  Occupational History   Occupation: retired  Tobacco Use   Smoking status: Former    Types: Cigarettes    Quit date: 06/02/2001    Years since quitting: 20.4   Smokeless tobacco: Never  Vaping Use   Vaping Use:  Never used  Substance and Sexual Activity   Alcohol use: Yes    Alcohol/week: 0.0 standard drinks of alcohol    Comment: social-1 every 6 months   Drug use: No   Sexual activity: Not on file  Other Topics Concern   Not on file  Social History Narrative   Drinks very little caffeine    Social Determinants of Health   Financial Resource Strain: Not on file  Food Insecurity: Not on file  Transportation Needs: Not on file  Physical Activity: Not on file  Stress: Not on file  Social Connections: Not on file  Intimate Partner Violence: Not on file    Review of Systems: ROS is O/W negative except as mentioned  in HPI.  Physical Exam: Vital signs in last 24 hours: Temp:  [98.3 F (36.8 C)] 98.3 F (36.8 C) (06/09 0711) Pulse Rate:  [85-91] 86 (06/09 0930) Resp:  [15-25] 20 (06/09 0930) BP: (107-130)/(41-62) 119/46 (06/09 0930) SpO2:  [77 %-100 %] 100 % (06/09 0930)   General:  Alert, Well-developed, well-nourished, pleasant and cooperative in NAD; pale Head:  Normocephalic and atraumatic. Eyes:  Sclera clear, no icterus.  Conjunctiva pale. Ears:  Normal auditory acuity. Mouth:  No deformity or lesions.   Lungs:  Clear throughout to auscultation.  No wheezes, crackles, or rhonchi.  Heart:  Regular rate and rhythm; valve click noted. Abdomen:  Soft, non-distended.  BS present.  Mild mid-abdominal TTP. Rectal:  Melena, heme positive per EDP. Msk:  Symmetrical without gross deformities. Pulses:  Normal pulses noted. Extremities:  Without clubbing or edema. Neurologic:  Alert and oriented x 4;  grossly normal neurologically. Skin:  Intact without significant lesions or rashes. Psych:  Alert and cooperative. Normal mood and affect.  Intake/Output this shift: Total I/O In: 100 [IV Piggyback:100] Out: -   Lab Results: Recent Labs    11/08/21 0716  WBC 11.0*  HGB 4.6*  HCT 14.4*  PLT 233   BMET Recent Labs    11/08/21 0716  NA 134*  K 3.8  CL 101  CO2 22  GLUCOSE 148*  BUN 61*  CREATININE 1.70*  CALCIUM 8.1*   LFT Recent Labs    11/08/21 0716  PROT 5.2*  ALBUMIN 2.5*  AST 23  ALT 16  ALKPHOS 47  BILITOT 0.5   Studies/Results: DG Chest Portable 1 View  Result Date: 11/08/2021 CLINICAL DATA:  SOB anemia EXAM: PORTABLE CHEST 1 VIEW COMPARISON:  Oct 20, 2019 FINDINGS: As before, there are left subclavian cardiac pacers with lead tip over the right atrium and right ventricle. Epicardial pacing wires noted. Again seen are sternotomy wires and cardiac valve replacement. No cardiomegaly. Mediastinal contours are within normal limits. No focal consolidation or pleural  effusion. There is mild central pulmonary vascular dilatation seen. 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 There has been interval left shoulder prosthesis with apparent minimal widening at the prosthetic glenohumeral joint. IMPRESSION: Mild central pulmonary vascular congestion. No pleural effusion or consolidation. There has been interval left shoulder prosthesis with apparent minimal widening at the prosthetic glenohumeral joint. Clinical correlation is suggested. Electronically Signed   By: Frazier Richards M.D.   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  Now with an almost 5 g drop in hemoglobin to 4.6 g with dark stools that began  4-5 days ago at this point.  Melena with Hemoccult positive stools on exam.  BUN is elevated.  Now that we see that her INR has returned at >81 certainly she could have bled anywhere in the GI tract, even mucosal oozing.  She is receiving 3 units PRBCs. *Coumadin coagulopathy for mechanical valve: INR >10.  PLAN: -PPI drip. -Transfuse to hemoglobin of 7 to 8 g and monitor hemoglobin closely. -Receiving Vitamin K to reverse INR. -Will need EGD when transfused and INR in safer range. -Clear liquids for now then NPO after midnight in case of procedure 6/10.  Laban Emperor. Doctor Sheahan  11/08/2021, 10:00 AM

## 2021-11-08 NOTE — ED Triage Notes (Signed)
Pt reported to ED with c/o rectal bleeding since Sunday and abdominal pain. Has hx of the same.

## 2021-11-08 NOTE — Progress Notes (Signed)
Patient transferred from ED at 1514hrs.  PRBC infusing without problem.  IV protonix infusing as well.  Patient updated on plan of care and oriented to room. Patient verbalized understanding.

## 2021-11-08 NOTE — H&P (Addendum)
TRH H&P   Patient Demographics:    [name], is a 81 y.o. female  MRN: [id number]   DOB - 1941-04-16  Admit Date - 11/08/2021  Outpatient Primary MD for the patient is [name], MD  Outpatient Specialists: Dr. Gwenlyn Found, Dr Sallyanne Kuster, DR Lenna Sciara.Henrene Pastor    Patient coming from: Home  Chief Complaint  Patient presents with   Rectal Bleeding      HPI:    [name]  is a 81 y.o. 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  In the ED ER work-up suggestive of ongoing upper GI bleed causing severe blood loss related anemia with hemoglobin of 4.6.  GI, cardiology were consulted and I was requested to admit the patient.  Patient currently is relatedly symptom-free except for dark-colored stools and some exertional fatigue, no chest pain or, no shortness of breath at rest, mild nausea but no emesis, no blood in urine, no dysuria, no focal weakness, no fever or chills.  No other subjective complaints.    Review of systems:    A full 10 point Review of Systems was done, except as stated above, all other Review of Systems were negative.   With Past History of the following :    Past Medical History:  Diagnosis Date   Allergy    seasonal   Anemia    Anxiety    Arthritis    Asthma    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 Protecta   Biventricular ICD  (implantable cardioverter-defibrillator) in place    with CRT   Blood transfusion without reported diagnosis    Bursitis    Cataract    RIGHT EYE   CHF (congestive heart failure) (HCC)    Chronic kidney disease    Colon polyp    adenomatous   Complication of anesthesia    patient stated that had difficulty getting the breathing tube removed, patient said that she stopped breathing and HR dropped to 10  patient then woke up and started breathing pateint stated no longer  than one minute; re-intubated in PACU following cholecystectomy 10/28/13   COPD (chronic obstructive pulmonary disease) (HCC)    Depression    Diverticulosis    Dysrhythmia    Fibromyalgia    GERD (gastroesophageal reflux disease)    Gout    H/O mitral valve replacement 2002, 2007   Heart murmur    Hemorrhoids    Hyperlipidemia    Hypertension    Hypothyroidism    IBS (irritable bowel syndrome)    MVP (mitral valve prolapse)    Neuromuscular disorder (HCC)    fibromyalgia   Pacemaker    Peptic ulcer disease       Past Surgical History:  Procedure Laterality Date   ABDOMINAL HYSTERECTOMY     BIV ICD GENERATOR CHANGEOUT N/A 04/29/2017   Procedure: BIV ICD GENERATOR CHANGEOUT;  Surgeon: Sanda Klein, MD;  Location: [place] [facility];  Service: Cardiovascular;  Laterality: N/A;   CARDIAC CATHETERIZATION  02/25/2006   normal left main, normal LAD, normal L Cfx, normal/dominant RCA (Dr. [name])   CARDIAC DEFIBRILLATOR PLACEMENT  2007, 11/202012   x2 (pacemaker) (Dr. Jerilynn Mages. Croitoru)   [place]  2002   MV repair - Dr. Berle Mull   CHOLECYSTECTOMY N/A 10/28/2013   Procedure: LAPAROSCOPIC CHOLECYSTECTOMY WITH INTRAOPERATIVE CHOLANGIOGRAM;  Surgeon: Edward Jolly, MD;  Location: [place];  Service: General;  Laterality: N/A;   COLONOSCOPY     ESOPHAGOGASTRODUODENOSCOPY (EGD) WITH PROPOFOL N/A 03/02/2021   Procedure: ESOPHAGOGASTRODUODENOSCOPY (EGD) WITH PROPOFOL;  Surgeon: Daryel November, MD;  Location: [place];  Service: Gastroenterology;  Laterality: N/A;   EYE SURGERY     HEMOSTASIS CLIP PLACEMENT  03/02/2021   Procedure: HEMOSTASIS CLIP PLACEMENT;  Surgeon: Daryel November, MD;  Location: [facility]OSCOPY;  Service: Gastroenterology;;   HOT HEMOSTASIS  03/02/2021   Procedure: HOT HEMOSTASIS (ARGON PLASMA COAGULATION/BICAP);  Surgeon: Daryel November, MD;  Location: [place];  Service: Gastroenterology;;   IMPLANTABLE CARDIOVERTER DEFIBRILLATOR (ICD) GENERATOR CHANGE N/A 04/10/2011   Procedure: ICD GENERATOR CHANGE;  Surgeon: Sanda Klein, MD;  Location: [place] [facility];  Service: Cardiovascular;  Laterality: N/A;   INSERT / REPLACE / REMOVE PACEMAKER     KNEE ARTHROSCOPY     MITRAL VALVE REPLACEMENT  02/26/2006   re-do MVR w/29m St. [name] (Dr. BEllison Hughs   [place] 2005   persantine myoview - low ris, EF 63%   RIGHT HEART CATH  04/03/2006   pulm cap wedge pressure 24/24, PA pressure 43/22 (mean 323mg), CO 4.8, CI 4.1 (Dr. J.Jackie Plum  [place]   Procedure: SCClide Deutscher Surgeon: CuDaryel NovemberMD;  [facility];  Service: Gastroenterology;;   TOTAL SHOULDER ARTHROPLASTY Right 12/27/2015   Procedure: RIGHT TOTAL SHOULDER ARTHROPLASTY;  Surgeon: JuTania AdeMD;  [place] Service: [place]ics;  Laterality: Right;  Right total shoulder arthroplasty   TOTAL SHOULDER ARTHROPLASTY Left 10/27/2019   Procedure: REVERSE TOTAL SHOULDER ARTHROPLASTY;  Surgeon: ChTania AdeMD;  Location: [facility];  Service: Orthopedics;  Laterality: Left;   TRANSTHORACIC ECHOCARDIOGRAM  12/2011   EF 50-55%, mild global hypokinesis; LA severely dilated; calcification of anterior/posterior MV leaflets, bi-leaflets St. [name] mechanical MV; mild TR; trace AV regurg/pulm valve regurg      Social History:     Social History   Tobacco Use   Smoking status: Former    Types: Cigarettes    Quit date: 06/02/2001    Years since  quitting:  20.4   Smokeless tobacco: Never  Substance Use Topics   Alcohol use: Yes    Alcohol/week: 0.0 standard drinks of alcohol    Comment: social-1 every 6 months         Family History :     Family History  Problem Relation Age of Onset   Breast cancer Sister        multiple   Uterine cancer Sister        multiple   Heart disease Mother    Heart attack Mother    Heart disease Father    Kidney disease Father    Heart attack Father    Heart disease Sister    Crohn's disease Sister    Heart disease Son    Colon cancer Neg Hx    Stomach cancer Neg Hx        Home Medications:   Prior to Admission medications   Medication Sig Start Date End Date Taking? Authorizing Provider  acetaminophen (TYLENOL) 650 MG CR tablet Take 650 mg by mouth every 8 (eight) hours as needed for pain.    [provider]  allopurinol (ZYLOPRIM) 300 MG tablet Take 300 mg by mouth every evening.     [provider]  ALPRAZolam Duanne Moron) 0.25 MG tablet Take 0.25 mg by mouth at bedtime as needed for anxiety or sleep.     [provider]  antiseptic oral rinse (BIOTENE) LIQD 15 mLs by Mouth Rinse route as needed for dry mouth. Patient not taking: Reported on 11/07/2021    [provider]  Ascorbic Acid (VITA-C PO) Take 3 capsules by mouth daily. Patient not taking: Reported on 11/07/2021    [provider]  buPROPion (WELLBUTRIN SR) 150 MG 12 hr tablet Take 150 mg by mouth in the morning.    [provider]  esomeprazole (NEXIUM) 20 MG capsule Take 1 capsule (20 mg total) by mouth 2 (two) times daily before a meal. 03/05/21   Nita Sells, MD  fluticasone (FLONASE) 50 MCG/ACT nasal spray Place 1 spray into both nostrils as needed for allergies. Patient not taking: Reported on 11/07/2021    [provider]  furosemide (LASIX) 40 MG tablet TAKE 1 TABLET BY MOUTH DAILY. **NEEDS OV FOR FUTURE REFILLS** 03/21/21   Lorretta Harp, MD   icosapent Ethyl (VASCEPA) 1 g capsule TAKE 2 CAPSULES BY MOUTH 2 TIMES DAILY. Patient not taking: Reported on 11/07/2021 08/29/21   Lorretta Harp, MD  metoCLOPramide (REGLAN) 5 MG tablet Take 5 mg by mouth 2 (two) times daily as needed. 11/05/21   [provider]  metoprolol succinate (TOPROL-XL) 25 MG 24 hr tablet Take 25 mg in the morning and 50 mg (two tablets) in the evening 11/07/21   Croitoru, Mihai, MD  Multiple Vitamin (MULTIVITAMIN WITH MINERALS) TABS tablet Take 1 tablet by mouth daily.    [provider]  pravastatin (PRAVACHOL) 40 MG tablet Take 40 mg at bedtime by mouth.     [provider]  PROAIR HFA 108 (90 BASE) MCG/ACT inhaler Inhale 1-2 puffs into the lungs every 6 (six) hours as needed for wheezing.  Patient not taking: Reported on 11/07/2021 01/23/15   [provider]  Sennosides (SENNA) 15 MG TABS Take 15 mg by mouth at bedtime.     [provider]  spironolactone (ALDACTONE) 25 MG tablet Take 1 tablet (25 mg total) by mouth daily. 08/19/21   Lorretta Harp, MD  SYNTHROID 50 MCG tablet Take 50  mcg by mouth daily before breakfast. Patient not taking: Reported on 11/07/2021 06/05/16   [provider]  VOLTAREN 1 % GEL Apply 2-4 g topically in the morning and at bedtime.    [provider]  warfarin (COUMADIN) 2.5 MG tablet Take 1-2 tablets (2.5-5 mg total) by mouth See admin instructions. Take 5 mg by mouth after 6 PM on Sun/Tues/Wed/Thurs/Sat and 2.5 mg on Mon/Fri Patient taking differently: Take 2.5 mg by mouth See admin instructions. Take on Monday, Tuesday, and Friday 07/05/21   Lorretta Harp, MD  warfarin (COUMADIN) 5 MG tablet Take this dose for 3 days and have your INR rechecked on Friday 10/7 and consult ur pharmacist for further instructions Patient not taking: Reported on 11/07/2021 03/05/21   Nita Sells, MD     Allergies:     Allergies  Allergen Reactions   Ace Inhibitors Cough   Lipitor  [Atorvastatin Calcium] Other (See Comments)    Knee pain    Megace [Megestrol] Other (See Comments)    High blood pressure and blurred vision   Aspirin Other (See Comments)    Nephrologist said to not take this   Clorazepate Dipotassium Other (See Comments)    Interacts with a drug being taken   Simvastatin Other (See Comments)    Muscle pain   Tiotropium Bromide Monohydrate Other (See Comments)    Dry mouth   Codeine Nausea And Vomiting   Tape Rash     Physical Exam:   Vitals  Blood pressure (!) 115/49, pulse 86, temperature 98.1 F (36.7 C), temperature source Oral, resp. rate (!) 26, SpO2 100 %.   1. General -pleasant elderly white female lying in hospital bed in no discomfort  2. Normal affect and insight, Not Suicidal or Homicidal, Awake Alert,   3. No F.N deficits, ALL C.Nerves Intact, Strength 5/5 all 4 extremities, Sensation intact all 4 extremities, Plantars down going.  4. Ears and Eyes appear Normal, Conjunctivae clear, PERRLA. Moist Oral Mucosa.  5. Supple Neck, No JVD, No cervical lymphadenopathy appriciated, No Carotid Bruits.  6. Symmetrical Chest wall movement, Good air movement bilaterally, CTAB.  7. RRR, No Gallops, positive mitral valve murmur,  8. Positive Bowel Sounds, Abdomen Soft, No tenderness, No organomegaly appriciated,No rebound -guarding or rigidity.  9.  No Cyanosis, Normal Skin Turgor, No Skin Rash or Bruise.  10. Good muscle tone,  joints appear normal , no effusions, Normal ROM.  11. No Palpable Lymph Nodes in Neck or Axillae      Data Review:   Recent Labs  Lab 11/08/21 0716  WBC 11.0*  HGB 4.6*  HCT 14.4*  PLT 233  MCV 97.3  MCH 31.1  MCHC 31.9  RDW 15.8*    Recent Labs  Lab 11/08/21 0716  NA 134*  K 3.8  CL 101  CO2 22  GLUCOSE 148*  BUN 61*  CREATININE 1.70*  CALCIUM 8.1*  AST 23  ALT 16  ALKPHOS 47  BILITOT 0.5  ALBUMIN 2.5*    Urinalysis    Component Value Date/Time   COLORURINE YELLOW  10/20/2019 [place] 10/20/2019 1033   LABSPEC 1.009 10/20/2019 1033   PHURINE 6.0 10/20/2019 [place] 10/20/2019 [place] 10/20/2019 [place] 10/20/2019 [place] 10/20/2019 1033   PROTEINUR 30 (A) 10/20/2019 1033   NITRITE NEGATIVE 10/20/2019 1033   LEUKOCYTESUR TRACE (A) 10/20/2019 1033      Imaging Results:  DG Chest Portable 1 View  Result Date: 11/08/2021 CLINICAL DATA:  SOB anemia EXAM: PORTABLE CHEST 1 VIEW COMPARISON:  Oct 20, 2019 FINDINGS: As before, there are left subclavian cardiac pacers with lead tip over the right atrium and right ventricle. Epicardial pacing wires noted. Again seen are sternotomy wires and cardiac valve replacement. No cardiomegaly. Mediastinal contours are within normal limits. No focal consolidation or pleural effusion. There is mild central pulmonary vascular dilatation seen. 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 There has been interval left shoulder prosthesis with apparent minimal widening at the prosthetic glenohumeral joint. IMPRESSION: Mild central pulmonary vascular congestion. No pleural effusion or consolidation. There has been interval left shoulder prosthesis with apparent minimal widening at the prosthetic glenohumeral joint. Clinical correlation is suggested. Electronically Signed   By: Frazier Richards M.D.   On: 11/08/2021 09:47    My personal review of EKG: Rhythm paced rhythm.   Assessment & Plan:   Acute ongoing upper GI bleed related anemia. she has had multiple episodes of upper GI bleed in the past related to AVMs versus [place]oy lesion, currently she is getting 3 units of packed RBCs transfused along with IV PPI, GI has been consulted, hold Coumadin, monitor H&H.  2.  History of Saint [name] mitral valve replacement in 2002 on Coumadin.  Currently INR is 3, no need for active reversal, Coumadin will be held due to ongoing GI bleed, once INR falls below  2.5 we may consider heparin bridging based on her bleeding status, patient clearly understands the risks and benefits of holding anticoagulation currently, will involve her primary cardiology group as well.  Addendum her INR came back as greater than 10.  I do not think she is profusely bleeding, her bleeding has been ongoing for 5 days, it can be stabilized with PRBCs, will give 5 mg of vitamin K and monitor.  Hold FFP's for now.   3.  History of chronic combined systolic and diastolic CHF.  EF has now improved to 45-50% with biventricular pacemaker/defibrillator.  Currently compensated.  As needed Lasix for now, blood pressure soft hold blood pressure medications, currently no ACE/ARB/Entresto due to AKI.    4.  AKI.  Due to #1 above.  Transfuse and monitor.  Hold nephrotoxins.  5.  Dyslipidemia.  On home dose statin.  6.  Hypothyroidism.  Continue home dose Synthroid.    DVT Prophylaxis SCDs    AM Labs Ordered, also please review Full Orders  Family Communication: Admission, patients condition and plan of care including tests being ordered have been discussed with the patient and husband who indicate understanding and agree with the plan and Code Status.  Code Status Full  Likely DC to  Home  Condition GUARDED    Consults called: GI, Cards    Admission status: Inpt    Time spent in minutes : 35   Lala Lund M.D on 11/08/2021 at 10:45 AM  To page go to [email or web address] - password [facility]

## 2021-11-08 NOTE — ED Provider Notes (Signed)
[facility] EMERGENCY DEPARTMENT Provider Note   CSN: [id number] Arrival date & time: 11/08/21  7680     History  Chief Complaint  Patient presents with   Rectal Bleeding    [name] is a 81 y.o. 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  Patient presents to the ER for evaluation of rectal bleeding, shortness of breath and fatigue.  Patient reports Tuesday night, 11/05/2021 she noticed dark black-colored stool in the toilet, she denies any bright red blood.  Patient reports that shortly after she noticed some lower abdominal pain it was a cramping in sensation, moderate intensity, constant since onset.  Patient reports he has had around 3-4 stools a day each appear black without accompanying bright red blood.  She reports is very similar to previous stomach bleed that she had.  Patient's husband is at bedside and provides supplemental information, they report last INR was 3.0.  Patient reports associated shortness of breath on exertion and fatigue over the past 2 days.  They deny fall, injury, fever, chills, numbness, weakness, chest pain or any additional concerns  HPI     Home Medications Prior to Admission medications   Medication Sig Start Date End Date Taking? Authorizing Provider  acetaminophen (TYLENOL) 650 MG CR tablet Take 650 mg by mouth every 8 (eight) hours as needed for pain.    [provider]  allopurinol (ZYLOPRIM) 300 MG tablet Take 300 mg by mouth every evening.     [provider]  ALPRAZolam Duanne Moron) 0.25 MG tablet Take 0.25 mg by mouth at bedtime as needed for anxiety or sleep.     [provider]  antiseptic oral rinse (BIOTENE) LIQD 15 mLs by Mouth Rinse route as needed for dry mouth. Patient not taking: Reported on 11/07/2021    [provider]  Ascorbic Acid (VITA-C PO) Take 3 capsules by mouth  daily. Patient not taking: Reported on 11/07/2021    [provider]  buPROPion (WELLBUTRIN SR) 150 MG 12 hr tablet Take 150 mg by mouth in the morning.    [provider]  esomeprazole (NEXIUM) 20 MG capsule Take 1 capsule (20 mg total) by mouth 2 (two) times daily before a meal. 03/05/21   Nita Sells, MD  fluticasone (FLONASE) 50 MCG/ACT nasal spray Place 1 spray into both nostrils as needed for allergies. Patient not taking: Reported on 11/07/2021    [provider]  furosemide (LASIX) 40 MG tablet TAKE 1 TABLET BY MOUTH DAILY. **NEEDS OV FOR FUTURE REFILLS** 03/21/21   Lorretta Harp, MD  icosapent Ethyl (VASCEPA) 1 g capsule TAKE 2 CAPSULES BY MOUTH 2 TIMES DAILY. Patient not taking: Reported on 11/07/2021 08/29/21   Lorretta Harp, MD  metoCLOPramide (REGLAN) 5 MG tablet Take 5 mg by mouth 2 (two) times daily as needed. 11/05/21   [provider]  metoprolol succinate (TOPROL-XL) 25 MG 24 hr tablet Take 25 mg in the morning and 50 mg (two tablets) in the evening 11/07/21   Croitoru, Mihai, MD  Multiple Vitamin (MULTIVITAMIN WITH MINERALS) TABS tablet Take 1 tablet by mouth daily.    [provider]  pravastatin (PRAVACHOL) 40 MG tablet Take 40 mg at bedtime by mouth.     [provider]  PROAIR HFA 108 (90 BASE) MCG/ACT inhaler Inhale 1-2 puffs into the lungs every 6 (six) hours as needed for wheezing.  Patient not taking: Reported  on 11/07/2021 01/23/15   [provider]  Sennosides (SENNA) 15 MG TABS Take 15 mg by mouth at bedtime.     [provider]  spironolactone (ALDACTONE) 25 MG tablet Take 1 tablet (25 mg total) by mouth daily. 08/19/21   Lorretta Harp, MD  SYNTHROID 50 MCG tablet Take 50 mcg by mouth daily before breakfast. Patient not taking: Reported on 11/07/2021 06/05/16   [provider]  VOLTAREN 1 % GEL Apply 2-4 g topically in the morning and at bedtime.    [provider]  warfarin  (COUMADIN) 2.5 MG tablet Take 1-2 tablets (2.5-5 mg total) by mouth See admin instructions. Take 5 mg by mouth after 6 PM on Sun/Tues/Wed/Thurs/Sat and 2.5 mg on Mon/Fri Patient taking differently: Take 2.5 mg by mouth See admin instructions. Take on Monday, Tuesday, and Friday 07/05/21   Lorretta Harp, MD  warfarin (COUMADIN) 5 MG tablet Take this dose for 3 days and have your INR rechecked on Friday 10/7 and consult ur pharmacist for further instructions Patient not taking: Reported on 11/07/2021 03/05/21   Nita Sells, MD      Allergies    Ace inhibitors, Lipitor [atorvastatin calcium], Megace [megestrol], Aspirin, Clorazepate dipotassium, Simvastatin, Tiotropium bromide monohydrate, Codeine, and Tape    Review of Systems   Review of Systems Ten systems are reviewed and are negative for acute change except as noted in the HPI  Physical Exam Updated Vital Signs BP (!) 125/53   Pulse 82   Temp 98 F (36.7 C) (Oral)   Resp (!) 23   SpO2 92%  Physical Exam Constitutional:      General: She is not in acute distress.    Appearance: Normal appearance. She is well-developed. She is not ill-appearing or diaphoretic.  HENT:     Head: Normocephalic and atraumatic.  Eyes:     General: Vision grossly intact. Gaze aligned appropriately.     Pupils: Pupils are equal, round, and reactive to light.  Neck:     Trachea: Trachea and phonation normal.  Cardiovascular:     Rate and Rhythm: Normal rate and regular rhythm.  Pulmonary:     Effort: Pulmonary effort is normal. No respiratory distress.     Breath sounds: Normal breath sounds.  Abdominal:     General: There is no distension.     Palpations: Abdomen is soft.     Tenderness: There is no abdominal tenderness. There is no guarding or rebound.  Genitourinary:    Comments: Rectal examination chaperoned by [place] RN.  No external hemorrhoids fissures or abscesses.  Normal rectal tone.  No internal hemorrhoids palpated.  No bright  red blood on examination.  Melanotic stool present. Musculoskeletal:        General: Normal range of motion.     Cervical back: Normal range of motion.  Skin:    General: Skin is warm and dry.  Neurological:     Mental Status: She is alert.     GCS: GCS eye subscore is 4. GCS verbal subscore is 5. GCS motor subscore is 6.     Comments: Speech is clear and goal oriented, follows commands Major Cranial nerves without deficit, no facial droop Moves extremities without ataxia, coordination intact  Psychiatric:        Behavior: Behavior normal.     ED Results / Procedures / Treatments   Labs (all labs ordered are listed, but only abnormal results are displayed) Labs Reviewed  COMPREHENSIVE METABOLIC PANEL -  Abnormal; Notable for the following components:      Result Value   Sodium 134 (*)    Glucose, Bld 148 (*)    BUN 61 (*)    Creatinine, Ser 1.70 (*)    Calcium 8.1 (*)    Total Protein 5.2 (*)    Albumin 2.5 (*)    GFR, Estimated 30 (*)    All other components within normal limits  CBC - Abnormal; Notable for the following components:   WBC 11.0 (*)    RBC 1.48 (*)    Hemoglobin 4.6 (*)    HCT 14.4 (*)    RDW 15.8 (*)    All other components within normal limits  POC OCCULT BLOOD, ED - Abnormal; Notable for the following components:   Fecal Occult Bld POSITIVE (*)    All other components within normal limits  APTT  PROTIME-INR  APTT  CBC  POC OCCULT BLOOD, ED  TYPE AND SCREEN  PREPARE RBC (CROSSMATCH)  PREPARE RBC (CROSSMATCH)  TROPONIN I (HIGH SENSITIVITY)    EKG None  Radiology DG Chest Portable 1 View  Result Date: 11/08/2021 CLINICAL DATA:  SOB anemia EXAM: PORTABLE CHEST 1 VIEW COMPARISON:  Oct 20, 2019 FINDINGS: As before, there are left subclavian cardiac pacers with lead tip over the right atrium and right ventricle. Epicardial pacing wires noted. Again seen are sternotomy wires and cardiac valve replacement. No cardiomegaly. Mediastinal contours are  within normal limits. No focal consolidation or pleural effusion. There is mild central pulmonary vascular dilatation seen. 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 There has been interval left shoulder prosthesis with apparent minimal widening at the prosthetic glenohumeral joint. IMPRESSION: Mild central pulmonary vascular congestion. No pleural effusion or consolidation. There has been interval left shoulder prosthesis with apparent minimal widening at the prosthetic glenohumeral joint. Clinical correlation is suggested. Electronically Signed   By: Frazier Richards M.D.   On: 11/08/2021 09:47    Procedures .Critical Care  Performed by: Deliah Boston, PA-C Authorized by: Deliah Boston, PA-C   Critical care provider statement:    Critical care time (minutes):  45   Critical care was necessary to treat or prevent imminent or life-threatening deterioration of the following conditions:  Circulatory failure   Critical care was time spent personally by me on the following activities:  Development of treatment plan with patient or surrogate, discussions with consultants, evaluation of patient's response to treatment, examination of patient, ordering and review of laboratory studies, ordering and review of radiographic studies, ordering and performing treatments and interventions, pulse oximetry, re-evaluation of patient's condition, review of old charts and obtaining history from patient or surrogate   Care discussed with: admitting provider   Comments:     Symptomatic anemia requiring transfusion due to suspected GI bleed on Coumadin.     Medications Ordered in ED Medications  pantoprozole (PROTONIX) 80 mg /NS 100 mL infusion (8 mg/hr Intravenous New Bag/Given 11/08/21 0826)  0.9 %  sodium chloride infusion (has no administration in time range)  0.9 %  sodium chloride infusion (has no administration in time range)  levothyroxine (SYNTHROID) tablet 50 mcg (has no administration in time range)   pravastatin (PRAVACHOL) tablet 40 mg (has no administration in time range)  metoprolol succinate (TOPROL-XL) 24 hr tablet 12.5 mg (has no administration in time range)  ALPRAZolam (XANAX) tablet 0.25 mg (has no administration in time range)  furosemide (LASIX) injection 20 mg (has no administration in time range)  pantoprazole (PROTONIX)  80 mg /NS 100 mL IVPB (0 mg Intravenous Stopped 11/08/21 3220)    ED Course/ Medical Decision Making/ A&P Clinical Course as of 11/08/21 1102  Fri Nov 08, 2021  0924 Repaged GI [BM]  0924 CBC(!!) Significant anemia of 4.6 suspected due to upper GI bleed.  Suspect anemia as cause of patient's shortness of breath and fatigue.  Mild leukocytosis of 11.0, no infectious symptoms.  No thrombocytopenia. [BM]  [id number] Comprehensive metabolic panel(!) AKI with creatinine of 1.7, BUN 61, GFR of 30.  No emergent electrolyte derangements LFT elevations or gap [BM]  1018 Troponin I (High Sensitivity) High-sensitivity troponin within normal limits. [BM]  1018 POC occult blood, ED(!) Positive [BM]  1019 DG Chest Portable 1 View Pulmonary vascular congestion noted.  No clear evidence for PNA/PTX.  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  Pacemaker present.  Patient with demonstrates good ROM of both shoulders without pain, can follow-up with orthopedist for possible glenohumeral joint widening. [BM]    Clinical Course User Index [BM] Deliah Boston, PA-C                           Medical Decision Making 81 year old female on Coumadin for mechanical heart valve presented for melena x2-3 days along with abdominal cramping.  Rectal examination shows melanotic stool, no bright red blood.   Labs today were significant for hemoglobin of 4.9, patient endorsing shortness of breath and fatigue suspected due to symptomatic anemia.  Labs were also significant for AKI with GFR of 30.  Given her low GFR CT angio for GI bleed was canceled, I consulted with gastroenterology  who advised that they do not need the CT scan at this point and they plan to bring patient for endoscopy as soon as they are available.  Patient was consented for blood products today, transfusion was given after crossmatch.  I then consulted hospitalist Dr. Candiss Norse who agreed to admit the patient but asked that I consult cardiology for them to weigh on patient's [facility] [name] mitral valve and to to weigh in on Coumadin reversal.    Patient was reassessed multiple times during this visit she remains stable and in no acute distress.  Patient is agreeable to plan of care and admission she had no further complaints or concerns. --- 11:02 AM: Consulted with cardiology scheduled [name], reports they will be by to consult the patient later on today. ------------- 11:11 AM: Patient's INR returned at 10.0. Dr. Wyvonnia Dusky consulted with pharmacy, advises to give 10 mg of vitamin K IV along with 2 units FFP.  Medications ordered.  Dr. Candiss Norse updated.  Patient reassessed resting comfortably no acute distress reports she feels well.  Blood pressure 121/48 ---------- 11:17 AM: Dr. Candiss Norse recommends 5 mg vitamin K instead of 10 mg.  Order has been changed.  Further management per hospitalist team.  Amount and/or Complexity of Data Reviewed Independent Historian: spouse External Data Reviewed: notes. Labs: ordered. Decision-making details documented in ED Course. Radiology: ordered. Decision-making details documented in ED Course. ECG/medicine tests: independent interpretation performed.  Risk Prescription drug management. Drug therapy requiring intensive monitoring for toxicity. Decision regarding hospitalization.  Critical Care Total time providing critical care: 45 minutes     Patient seen and evaluated by Dr. Wyvonnia Dusky during this visit who agrees with plan and management.  Note: Portions of this report may have been transcribed using voice recognition software. Every effort was made to ensure accuracy;  however, inadvertent computerized transcription  errors may still be present.         Final Clinical Impression(s) / ED Diagnoses Final diagnoses:  Acute GI bleeding  Symptomatic anemia    Rx / DC Orders ED Discharge Orders     None         Gari Crown 11/08/21 1119    Ezequiel Essex, MD 11/08/21 2042

## 2021-11-08 NOTE — Consult Note (Signed)
Referring Provider: EDP, Dr. Wyvonnia Dusky Primary Care Physician:  [name], MD Primary Gastroenterologist:  Dr. Henrene Pastor  Reason for Consultation:  GI bleed  HPI: [name] is a 81 y.o. female who has [place] of [employer] [name] mitral valve replaced in 2002 on Coumadin, biventricular pacemaker/defibrillator - Medtronic placed November 8144, chronic systolic and diastolic CHF with a EF of around 45%, history of recurrent upper GI bleeds, dyslipidemia, hypertension who has been having intermittent black stools for the last 5 days.  She says that the black stools began on Sunday evening.  She took some senna for constipation and once she started having bowel movements they were pitch black in color.  She has periodic vomiting due to sinus drainage/postnasal drip.  She has not seen any sign of blood in her emesis.  Hemoglobin is 4.6 g down from 9.2 g 3 months ago.  BUN is elevated at 61.  INR >10.  EGD 03/2021:  - The examined portions of the nasopharynx, oropharynx and larynx were normal. - Normal esophagus. - 3 cm hiatal hernia. - Pale mucosa in the stomach. - Blood in the duodenal bulb and in the second portion of the duodenum. 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 Injected. Clips (MR conditional) were placed. Treated with argon plasma coagulation (APC). - No specimens collected.  Past Medical History:  Diagnosis Date   Allergy    seasonal   Anemia    Anxiety    Arthritis    Asthma    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 Protecta   Biventricular ICD (implantable cardioverter-defibrillator) in place    with CRT   Blood transfusion without reported diagnosis    Bursitis    Cataract    RIGHT EYE   CHF (congestive heart failure) ([place])    Chronic kidney disease    Colon polyp    adenomatous   Complication of anesthesia    patient stated that had difficulty getting the breathing tube removed, patient said that  she stopped breathing and HR dropped to 10  patient then woke up and started breathing pateint stated no longer than one minute; re-intubated in PACU following cholecystectomy 10/28/13   COPD (chronic obstructive pulmonary disease) ([place])    Depression    Diverticulosis    Dysrhythmia    Fibromyalgia    GERD (gastroesophageal reflux disease)    Gout    H/O mitral valve replacement 2002, 2007   Heart murmur    Hemorrhoids    Hyperlipidemia    Hypertension    Hypothyroidism    IBS (irritable bowel syndrome)    MVP (mitral valve prolapse)    Neuromuscular disorder ([place])    fibromyalgia   Pacemaker    Peptic ulcer disease     Past Surgical History:  Procedure Laterality Date   ABDOMINAL HYSTERECTOMY     BIV ICD GENERATOR CHANGEOUT N/A 04/29/2017   Procedure: BIV ICD GENERATOR CHANGEOUT;  Surgeon: Sanda Klein, MD;  Location: [place] [facility];  Service: Cardiovascular;  Laterality: N/A;   CARDIAC CATHETERIZATION  02/25/2006   normal left main, normal LAD, normal L Cfx, normal/dominant RCA (Dr. [name])   [place]  2007, 11/202012   x2 (pacemaker) (Dr. Jerilynn Mages. Croitoru)   [place]  2002   MV repair - Dr. Berle Mull   CHOLECYSTECTOMY N/A 10/28/2013   Procedure: LAPAROSCOPIC CHOLECYSTECTOMY WITH INTRAOPERATIVE CHOLANGIOGRAM;  Surgeon: Edward Jolly, MD;  Location: [place];  Service: General;  Laterality: N/A;   COLONOSCOPY     ESOPHAGOGASTRODUODENOSCOPY (EGD) WITH PROPOFOL N/A 03/02/2021   Procedure: ESOPHAGOGASTRODUODENOSCOPY (EGD) WITH PROPOFOL;  Surgeon: Daryel November, MD;  Location: [place];  Service: Gastroenterology;  Laterality: N/A;   EYE SURGERY     HEMOSTASIS CLIP PLACEMENT  03/02/2021   Procedure: HEMOSTASIS CLIP PLACEMENT;  Surgeon: Daryel November, MD;  Location: [facility];  Service: Gastroenterology;;   HOT HEMOSTASIS  03/02/2021   Procedure: HOT HEMOSTASIS (ARGON PLASMA COAGULATION/BICAP);  Surgeon:  Daryel November, MD;  Location: [place];  Service: Gastroenterology;;   IMPLANTABLE CARDIOVERTER DEFIBRILLATOR (ICD) GENERATOR CHANGE N/A 04/10/2011   Procedure: ICD GENERATOR CHANGE;  Surgeon: Sanda Klein, MD;  Location: [place] [facility];  Service: Cardiovascular;  Laterality: N/A;   INSERT / REPLACE / REMOVE PACEMAKER     KNEE ARTHROSCOPY     MITRAL VALVE REPLACEMENT  02/26/2006   re-do MVR w/68m [name] (Dr. BEllison Hughs   [place] 2005   persantine myoview - low ris, EF 63%   RIGHT HEART CATH  04/03/2006   pulm cap wedge pressure 24/24, PA pressure 43/22 (mean 378mg), CO 4.8, CI 4.1 (Dr. J.Jackie Plum  [place]   Procedure: SCClide Deutscher Surgeon: CuDaryel NovemberMD;  [facility];  Service: Gastroenterology;;   TOTAL SHOULDER ARTHROPLASTY Right 12/27/2015   Procedure: RIGHT TOTAL SHOULDER ARTHROPLASTY;  Surgeon: JuTania AdeMD;  [place] [facility] [place] [place];  Laterality: Right;  Right total shoulder arthroplasty   TOTAL SHOULDER ARTHROPLASTY Left 10/27/2019   Procedure: REVERSE TOTAL SHOULDER ARTHROPLASTY;  Surgeon: ChTania AdeMD;  Location: [facility];  Service: Orthopedics;  Laterality: Left;   TRANSTHORACIC ECHOCARDIOGRAM  12/2011   EF 50-55%, mild global hypokinesis; LA severely dilated; calcification of anterior/posterior MV leaflets, bi-leaflets St. [name] mechanical MV; mild TR; trace AV regurg/pulm valve regurg    Prior to Admission medications   Medication Sig Start Date End Date Taking? Authorizing Provider  acetaminophen (TYLENOL) 650 MG CR tablet Take 650 mg by mouth every 8 (eight) hours as needed for pain.    [provider]  allopurinol (ZYLOPRIM) 300 MG tablet Take 300 mg by mouth every evening.     [provider]  ALPRAZolam (XDuanne Moron0.25 MG tablet Take 0.25 mg by mouth at bedtime as needed for anxiety or sleep.     [provider]  antiseptic oral rinse (BIOTENE) LIQD 15 mLs  by Mouth Rinse route as needed for dry mouth. Patient not taking: Reported on 11/07/2021    [provider]  Ascorbic Acid (VITA-C PO) Take 3 capsules by mouth daily. Patient not taking: Reported on 11/07/2021    [provider]  buPROPion (WELLBUTRIN SR) 150 MG 12 hr tablet Take 150 mg by mouth in the morning.    [provider]  esomeprazole (NEXIUM) 20 MG capsule Take 1 capsule (20 mg total) by mouth 2 (two) times daily before a meal. 03/05/21   SaNita SellsMD  fluticasone (FLONASE) 50 MCG/ACT nasal spray Place 1 spray into both nostrils as needed for allergies. Patient not taking: Reported on 11/07/2021    [provider]  furosemide (LASIX) 40 MG tablet TAKE 1 TABLET BY MOUTH DAILY. **NEEDS OV FOR FUTURE REFILLS** 03/21/21   BeLorretta HarpMD  icosapent Ethyl (VASCEPA) 1 g capsule TAKE 2 CAPSULES BY MOUTH 2 TIMES DAILY. Patient not taking: Reported on 11/07/2021 08/29/21   BeQuay Burow  J, MD  metoCLOPramide (REGLAN) 5 MG tablet Take 5 mg by mouth 2 (two) times daily as needed. 11/05/21   [provider]  metoprolol succinate (TOPROL-XL) 25 MG 24 hr tablet Take 25 mg in the morning and 50 mg (two tablets) in the evening 11/07/21   Croitoru, Mihai, MD  Multiple Vitamin (MULTIVITAMIN WITH MINERALS) TABS tablet Take 1 tablet by mouth daily.    [provider]  pravastatin (PRAVACHOL) 40 MG tablet Take 40 mg at bedtime by mouth.     [provider]  PROAIR HFA 108 (90 BASE) MCG/ACT inhaler Inhale 1-2 puffs into the lungs every 6 (six) hours as needed for wheezing.  Patient not taking: Reported on 11/07/2021 01/23/15   [provider]  Sennosides (SENNA) 15 MG TABS Take 15 mg by mouth at bedtime.     [provider]  spironolactone (ALDACTONE) 25 MG tablet Take 1 tablet (25 mg total) by mouth daily. 08/19/21   Lorretta Harp, MD  SYNTHROID 50 MCG tablet Take 50 mcg by mouth daily before breakfast. Patient not  taking: Reported on 11/07/2021 06/05/16   [provider]  VOLTAREN 1 % GEL Apply 2-4 g topically in the morning and at bedtime.    [provider]  warfarin (COUMADIN) 2.5 MG tablet Take 1-2 tablets (2.5-5 mg total) by mouth See admin instructions. Take 5 mg by mouth after 6 PM on Sun/Tues/Wed/Thurs/Sat and 2.5 mg on Mon/Fri Patient taking differently: Take 2.5 mg by mouth See admin instructions. Take on Monday, Tuesday, and Friday 07/05/21   Lorretta Harp, MD  warfarin (COUMADIN) 5 MG tablet Take this dose for 3 days and have your INR rechecked on Friday 10/7 and consult ur pharmacist for further instructions Patient not taking: Reported on 11/07/2021 03/05/21   Nita Sells, MD    Current Facility-Administered Medications  Medication Dose Route Frequency Provider Last Rate Last Admin   0.9 %  sodium chloride infusion  500 mL Intravenous Continuous Irene Shipper, MD       0.9 %  sodium chloride infusion  10 mL/hr Intravenous Once Rancour, Stephen, MD       0.9 %  sodium chloride infusion  10 mL/hr Intravenous Once Morelli, Brandon A, PA-C       pantoprozole (PROTONIX) 80 mg /NS 100 mL infusion  8 mg/hr Intravenous Continuous Rancour, Stephen, MD 10 mL/hr at 11/08/21 0826 8 mg/hr at 11/08/21 2035   Current Outpatient Medications  Medication Sig Dispense Refill   acetaminophen (TYLENOL) 650 MG CR tablet Take 650 mg by mouth every 8 (eight) hours as needed for pain.     allopurinol (ZYLOPRIM) 300 MG tablet Take 300 mg by mouth every evening.      ALPRAZolam (XANAX) 0.25 MG tablet Take 0.25 mg by mouth at bedtime as needed for anxiety or sleep.      antiseptic oral rinse (BIOTENE) LIQD 15 mLs by Mouth Rinse route as needed for dry mouth. (Patient not taking: Reported on 11/07/2021)     Ascorbic Acid (VITA-C PO) Take 3 capsules by mouth daily. (Patient not taking: Reported on 11/07/2021)     buPROPion (WELLBUTRIN SR) 150 MG 12 hr tablet Take 150 mg by mouth in the morning.      esomeprazole (NEXIUM) 20 MG capsule Take 1 capsule (20 mg total) by mouth 2 (two) times daily before a meal. 60 capsule 0   fluticasone (FLONASE) 50 MCG/ACT nasal spray Place 1 spray into both nostrils as needed  for allergies. (Patient not taking: Reported on 11/07/2021)     furosemide (LASIX) 40 MG tablet TAKE 1 TABLET BY MOUTH DAILY. **NEEDS OV FOR FUTURE REFILLS** 90 tablet 2   icosapent Ethyl (VASCEPA) 1 g capsule TAKE 2 CAPSULES BY MOUTH 2 TIMES DAILY. (Patient not taking: Reported on 11/07/2021) 120 capsule 11   metoCLOPramide (REGLAN) 5 MG tablet Take 5 mg by mouth 2 (two) times daily as needed.     metoprolol succinate (TOPROL-XL) 25 MG 24 hr tablet Take 25 mg in the morning and 50 mg (two tablets) in the evening 90 tablet 5   Multiple Vitamin (MULTIVITAMIN WITH MINERALS) TABS tablet Take 1 tablet by mouth daily.     pravastatin (PRAVACHOL) 40 MG tablet Take 40 mg at bedtime by mouth.      PROAIR HFA 108 (90 BASE) MCG/ACT inhaler Inhale 1-2 puffs into the lungs every 6 (six) hours as needed for wheezing.  (Patient not taking: Reported on 11/07/2021)     Sennosides (SENNA) 15 MG TABS Take 15 mg by mouth at bedtime.      spironolactone (ALDACTONE) 25 MG tablet Take 1 tablet (25 mg total) by mouth daily. 90 tablet 3   SYNTHROID 50 MCG tablet Take 50 mcg by mouth daily before breakfast. (Patient not taking: Reported on 11/07/2021)  2   VOLTAREN 1 % GEL Apply 2-4 g topically in the morning and at bedtime.     warfarin (COUMADIN) 2.5 MG tablet Take 1-2 tablets (2.5-5 mg total) by mouth See admin instructions. Take 5 mg by mouth after 6 PM on Sun/Tues/Wed/Thurs/Sat and 2.5 mg on Mon/Fri (Patient taking differently: Take 2.5 mg by mouth See admin instructions. Take on Monday, Tuesday, and Friday) 135 tablet 1   warfarin (COUMADIN) 5 MG tablet Take this dose for 3 days and have your INR rechecked on Friday 10/7 and consult ur pharmacist for further instructions (Patient not taking: Reported on 11/07/2021)       Allergies as of 11/08/2021 - Review Complete 11/08/2021  Allergen Reaction Noted   Ace inhibitors Cough 08/01/2008   Lipitor [atorvastatin calcium] Other (See Comments) 04/07/2011   Megace [megestrol] Other (See Comments) 05/16/2021   Aspirin Other (See Comments) 02/11/2021   Clorazepate dipotassium Other (See Comments) 02/11/2021   Simvastatin Other (See Comments) 02/11/2021   Tiotropium bromide monohydrate Other (See Comments) 02/11/2021   Codeine Nausea And Vomiting 01/19/2014   Tape Rash 08/01/2021    Family History  Problem Relation Age of Onset   Breast cancer Sister        multiple   Uterine cancer Sister        multiple   Heart disease Mother    Heart attack Mother    Heart disease Father    Kidney disease Father    Heart attack Father    Heart disease Sister    Crohn's disease Sister    Heart disease Son    Colon cancer Neg Hx    Stomach cancer Neg Hx     Social History   Socioeconomic History   Marital status: Married    Spouse name: [name]   Number of children: 4   Years of education: 12   Highest education level: Not on file  Occupational History   Occupation: retired  Tobacco Use   Smoking status: Former    Types: Cigarettes    Quit date: 06/02/2001    Years since quitting: 20.4   Smokeless tobacco: Never  Vaping Use   Vaping Use:  Never used  Substance and Sexual Activity   Alcohol use: Yes    Alcohol/week: 0.0 standard drinks of alcohol    Comment: social-1 every 6 months   Drug use: No   Sexual activity: Not on file  Other Topics Concern   Not on file  Social History Narrative   Drinks very little caffeine    Social Determinants of Health   Financial Resource Strain: Not on file  Food Insecurity: Not on file  Transportation Needs: Not on file  Physical Activity: Not on file  Stress: Not on file  Social Connections: Not on file  Intimate Partner Violence: Not on file    Review of Systems: ROS is O/W negative except as mentioned  in HPI.  Physical Exam: Vital signs in last 24 hours: Temp:  [98.3 F (36.8 C)] 98.3 F (36.8 C) (06/09 0711) Pulse Rate:  [85-91] 86 (06/09 0930) Resp:  [15-25] 20 (06/09 0930) BP: (107-130)/(41-62) 119/46 (06/09 0930) SpO2:  [77 %-100 %] 100 % (06/09 0930)   General:  Alert, Well-developed, well-nourished, pleasant and cooperative in NAD; pale Head:  Normocephalic and atraumatic. Eyes:  Sclera clear, no icterus.  Conjunctiva pale. Ears:  Normal auditory acuity. Mouth:  No deformity or lesions.   Lungs:  Clear throughout to auscultation.  No wheezes, crackles, or rhonchi.  Heart:  Regular rate and rhythm; valve click noted. Abdomen:  Soft, non-distended.  BS present.  Mild mid-abdominal TTP. Rectal:  Melena, heme positive per EDP. Msk:  Symmetrical without gross deformities. Pulses:  Normal pulses noted. Extremities:  Without clubbing or edema. Neurologic:  Alert and oriented x 4;  grossly normal neurologically. Skin:  Intact without significant lesions or rashes. Psych:  Alert and cooperative. Normal mood and affect.  Intake/Output this shift: Total I/O In: 100 [IV Piggyback:100] Out: -   Lab Results: Recent Labs    11/08/21 0716  WBC 11.0*  HGB 4.6*  HCT 14.4*  PLT 233   BMET Recent Labs    11/08/21 0716  NA 134*  K 3.8  CL 101  CO2 22  GLUCOSE 148*  BUN 61*  CREATININE 1.70*  CALCIUM 8.1*   LFT Recent Labs    11/08/21 0716  PROT 5.2*  ALBUMIN 2.5*  AST 23  ALT 16  ALKPHOS 47  BILITOT 0.5   Studies/Results: DG Chest Portable 1 View  Result Date: 11/08/2021 CLINICAL DATA:  SOB anemia EXAM: PORTABLE CHEST 1 VIEW COMPARISON:  Oct 20, 2019 FINDINGS: As before, there are left subclavian cardiac pacers with lead tip over the right atrium and right ventricle. Epicardial pacing wires noted. Again seen are sternotomy wires and cardiac valve replacement. No cardiomegaly. Mediastinal contours are within normal limits. No focal consolidation or pleural  effusion. There is mild central pulmonary vascular dilatation seen. 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 There has been interval left shoulder prosthesis with apparent minimal widening at the prosthetic glenohumeral joint. IMPRESSION: Mild central pulmonary vascular congestion. No pleural effusion or consolidation. There has been interval left shoulder prosthesis with apparent minimal widening at the prosthetic glenohumeral joint. Clinical correlation is suggested. Electronically Signed   By: Frazier Richards M.D.   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  Now with an almost 5 g drop in hemoglobin to 4.6 g with dark stools that began  4-5 days ago at this point.  Melena with Hemoccult positive stools on exam.  BUN is elevated.  Now that we see that her INR has returned at >84 certainly she could have bled anywhere in the GI tract, even mucosal oozing.  She is receiving 3 units PRBCs. *Coumadin coagulopathy for mechanical valve: INR >10.  PLAN: -PPI drip. -Transfuse to hemoglobin of 7 to 8 g and monitor hemoglobin closely. -Receiving Vitamin K to reverse INR. -Will need EGD when transfused and INR in safer range. -Clear liquids for now then NPO after midnight in case of procedure 6/10.  Laban Emperor. Kandas Oliveto  11/08/2021, 10:00 AM

## 2021-11-09 ENCOUNTER — Inpatient Hospital Stay (HOSPITAL_COMMUNITY): Payer: Medicare Other | Admitting: Anesthesiology

## 2021-11-09 ENCOUNTER — Encounter (HOSPITAL_COMMUNITY): Payer: Self-pay | Admitting: Internal Medicine

## 2021-11-09 ENCOUNTER — Encounter (HOSPITAL_COMMUNITY)
Admission: EM | Disposition: A | Discharge status: Home / Self Care | Payer: Self-pay | Source: Home / Self Care | Attending: Internal Medicine

## 2021-11-09 DIAGNOSIS — I110 Hypertensive heart disease with heart failure: Secondary | ICD-10-CM

## 2021-11-09 DIAGNOSIS — K31819 Angiodysplasia of stomach and duodenum without bleeding: Secondary | ICD-10-CM

## 2021-11-09 DIAGNOSIS — K2289 Other specified disease of esophagus: Secondary | ICD-10-CM

## 2021-11-09 DIAGNOSIS — D62 Acute posthemorrhagic anemia: Secondary | ICD-10-CM

## 2021-11-09 DIAGNOSIS — K31811 Angiodysplasia of stomach and duodenum with bleeding: Secondary | ICD-10-CM

## 2021-11-09 DIAGNOSIS — K922 Gastrointestinal hemorrhage, unspecified: Secondary | ICD-10-CM | POA: Diagnosis not present

## 2021-11-09 HISTORY — PX: HEMOSTASIS CLIP PLACEMENT: SHX6857

## 2021-11-09 HISTORY — PX: SUBMUCOSAL TATTOO INJECTION: SHX6856

## 2021-11-09 HISTORY — PX: ENTEROSCOPY: SHX5533

## 2021-11-09 HISTORY — PX: HOT HEMOSTASIS: SHX5433

## 2021-11-09 LAB — PROTIME-INR
INR: 1.9 — ABNORMAL HIGH (ref 0.8–1.2)
INR: 1.8 — ABNORMAL HIGH (ref 0.8–1.2)
Prothrombin Time: 22 seconds — ABNORMAL HIGH (ref 11.4–15.2)
Prothrombin Time: 20.3 seconds — ABNORMAL HIGH (ref 11.4–15.2)

## 2021-11-09 LAB — CBC WITH DIFFERENTIAL/PLATELET
Abs Immature Granulocytes: 0.12 10*3/uL — ABNORMAL HIGH (ref 0.00–0.07)
Basophils Absolute: 0 10*3/uL (ref 0.0–0.1)
Basophils Relative: 0 %
Eosinophils Absolute: 0.3 10*3/uL (ref 0.0–0.5)
Eosinophils Relative: 4 %
HCT: 19.4 % — ABNORMAL LOW (ref 36.0–46.0)
Hemoglobin: 6.8 g/dL — CL (ref 12.0–15.0)
Immature Granulocytes: 1 %
Lymphocytes Relative: 11 %
Lymphs Abs: 1 10*3/uL (ref 0.7–4.0)
MCH: 29.7 pg (ref 26.0–34.0)
MCHC: 35.1 g/dL (ref 30.0–36.0)
MCV: 84.7 fL (ref 80.0–100.0)
Monocytes Absolute: 0.8 10*3/uL (ref 0.1–1.0)
Monocytes Relative: 8 %
Neutro Abs: 6.9 10*3/uL (ref 1.7–7.7)
Neutrophils Relative %: 76 %
Platelets: 144 10*3/uL — ABNORMAL LOW (ref 150–400)
RBC: 2.29 MIL/uL — ABNORMAL LOW (ref 3.87–5.11)
RDW: 16.4 % — ABNORMAL HIGH (ref 11.5–15.5)
WBC: 9.2 10*3/uL (ref 4.0–10.5)
nRBC: 0.4 % — ABNORMAL HIGH (ref 0.0–0.2)

## 2021-11-09 LAB — CBC
HCT: 27.8 % — ABNORMAL LOW (ref 36.0–46.0)
HCT: 28.4 % — ABNORMAL LOW (ref 36.0–46.0)
Hemoglobin: 9.5 g/dL — ABNORMAL LOW (ref 12.0–15.0)
Hemoglobin: 9.5 g/dL — ABNORMAL LOW (ref 12.0–15.0)
MCH: 28.5 pg (ref 26.0–34.0)
MCH: 28.5 pg (ref 26.0–34.0)
MCHC: 34.2 g/dL (ref 30.0–36.0)
MCHC: 33.5 g/dL (ref 30.0–36.0)
MCV: 83.5 fL (ref 80.0–100.0)
MCV: 85.3 fL (ref 80.0–100.0)
Platelets: 127 10*3/uL — ABNORMAL LOW (ref 150–400)
Platelets: 135 10*3/uL — ABNORMAL LOW (ref 150–400)
RBC: 3.33 MIL/uL — ABNORMAL LOW (ref 3.87–5.11)
RBC: 3.33 MIL/uL — ABNORMAL LOW (ref 3.87–5.11)
RDW: 16.1 % — ABNORMAL HIGH (ref 11.5–15.5)
RDW: 16.3 % — ABNORMAL HIGH (ref 11.5–15.5)
WBC: 10.9 10*3/uL — ABNORMAL HIGH (ref 4.0–10.5)
WBC: 10.4 10*3/uL (ref 4.0–10.5)
nRBC: 0.4 % — ABNORMAL HIGH (ref 0.0–0.2)
nRBC: 0.4 % — ABNORMAL HIGH (ref 0.0–0.2)

## 2021-11-09 LAB — COMPREHENSIVE METABOLIC PANEL
ALT: 12 U/L (ref 0–44)
AST: 20 U/L (ref 15–41)
Albumin: 1.9 g/dL — ABNORMAL LOW (ref 3.5–5.0)
Alkaline Phosphatase: 39 U/L (ref 38–126)
Anion gap: 6 (ref 5–15)
BUN: 42 mg/dL — ABNORMAL HIGH (ref 8–23)
CO2: 24 mmol/L (ref 22–32)
Calcium: 7.2 mg/dL — ABNORMAL LOW (ref 8.9–10.3)
Chloride: 106 mmol/L (ref 98–111)
Creatinine, Ser: 1.43 mg/dL — ABNORMAL HIGH (ref 0.44–1.00)
GFR, Estimated: 37 mL/min — ABNORMAL LOW (ref >60)
Glucose, Bld: 90 mg/dL (ref 70–99)
Potassium: 4.1 mmol/L (ref 3.5–5.1)
Sodium: 136 mmol/L (ref 135–145)
Total Bilirubin: 0.9 mg/dL (ref 0.3–1.2)
Total Protein: 3.9 g/dL — ABNORMAL LOW (ref 6.5–8.1)

## 2021-11-09 LAB — MAGNESIUM: Magnesium: 1.3 mg/dL — ABNORMAL LOW (ref 1.7–2.4)

## 2021-11-09 LAB — PREPARE RBC (CROSSMATCH)

## 2021-11-09 SURGERY — ENTEROSCOPY
Anesthesia: Monitor Anesthesia Care

## 2021-11-09 MED ORDER — LACTATED RINGERS IV SOLN: INTRAVENOUS | Status: DC | PRN

## 2021-11-09 MED ORDER — LIDOCAINE 2% (20 MG/ML) 5 ML SYRINGE
INTRAMUSCULAR | Status: DC | PRN
  Administered 2021-11-09: 100 mg via INTRAVENOUS

## 2021-11-09 MED ORDER — SODIUM CHLORIDE 0.9 % IV SOLN
500.0000 mg | Freq: Once | INTRAVENOUS | Status: AC
  Administered 2021-11-09: 500 mg via INTRAVENOUS
  Filled 2021-11-09: qty 25

## 2021-11-09 MED ORDER — SODIUM CHLORIDE 0.9% IV SOLUTION: Freq: Once | INTRAVENOUS | Status: AC

## 2021-11-09 MED ORDER — PHENYLEPHRINE 80 MCG/ML (10ML) SYRINGE FOR IV PUSH (FOR BLOOD PRESSURE SUPPORT)
PREFILLED_SYRINGE | INTRAVENOUS | Status: DC | PRN
  Administered 2021-11-09 (×2): 80 ug via INTRAVENOUS

## 2021-11-09 MED ORDER — MAGNESIUM SULFATE 4 GM/100ML IV SOLN
4.0000 g | Freq: Once | INTRAVENOUS | Status: AC
Start: 2021-11-09 — End: 2021-11-09
  Administered 2021-11-09: 4 g via INTRAVENOUS
  Filled 2021-11-09: qty 100

## 2021-11-09 MED ORDER — PROPOFOL 10 MG/ML IV BOLUS
INTRAVENOUS | Status: DC | PRN
  Administered 2021-11-09: 10 mg via INTRAVENOUS
  Administered 2021-11-09: 20 mg via INTRAVENOUS

## 2021-11-09 MED ORDER — PROPOFOL 500 MG/50ML IV EMUL
INTRAVENOUS | Status: DC | PRN
  Administered 2021-11-09: 125 ug/kg/min via INTRAVENOUS

## 2021-11-09 MED ORDER — SPOT INK MARKER SYRINGE KIT
PACK | SUBMUCOSAL | Status: DC | PRN
  Administered 2021-11-09: 1 mL via SUBMUCOSAL

## 2021-11-09 MED ORDER — SODIUM CHLORIDE 0.9 % IV SOLN: INTRAVENOUS | Status: DC

## 2021-11-09 MED ORDER — HYDRALAZINE HCL 20 MG/ML IJ SOLN: 10.0000 mg | Freq: Four times a day (QID) | INTRAMUSCULAR | Status: DC | PRN

## 2021-11-09 MED ORDER — PANTOPRAZOLE SODIUM 20 MG PO TBEC
20.0000 mg | DELAYED_RELEASE_TABLET | Freq: Two times a day (BID) | ORAL | Status: DC
  Administered 2021-11-09 – 2021-11-12 (×6): 20 mg via ORAL
  Filled 2021-11-09 (×6): qty 1

## 2021-11-09 MED ORDER — LACTATED RINGERS IV SOLN
INTRAVENOUS | Status: AC | PRN
  Administered 2021-11-09: 10 mL/h via INTRAVENOUS

## 2021-11-09 NOTE — Op Note (Signed)
[facility] Patient Name: [name] Procedure Date : 11/09/2021 MRN: [id number] Attending MD: Justice Britain , MD Date of Birth: April 15, 1941 CSN: [id number] Age: 81 Admit Type: Inpatient Procedure:                Small bowel enteroscopy Indications:              Acute post hemorrhagic anemia, Melena, Occult blood                            in stool, Arteriovenous malformation in the small                            intestine, Personal history of upper GI endoscopy,                            Supratherapeutic INR >10 on admission Providers:                Justice Britain, MD, Jeanella Cara, RN,                            [facility] Technician, Technician Referring MD:             [facility] Medicines:                Monitored Anesthesia Care Complications:            No immediate complications. Estimated Blood Loss:     Estimated blood loss was minimal. Procedure:                Pre-Anesthesia Assessment:                           - Prior to the procedure, a History and Physical                            was performed, and patient medications and                            allergies were reviewed. The patient's tolerance of                            previous anesthesia was also reviewed. The risks                            and benefits of the procedure and the sedation                            options and risks were discussed with the patient.                            All questions were answered, and informed consent                            was obtained. Prior Anticoagulants: The patient has  taken Coumadin (warfarin), last dose was 2 days                            prior to procedure. ASA Grade Assessment: III - A                            patient with severe systemic disease. After                            reviewing the risks and benefits, the patient was                            deemed in satisfactory  condition to undergo the                            procedure.                           After obtaining informed consent, the endoscope was                            passed under direct vision. Throughout the                            procedure, the patient's blood pressure, pulse, and                            oxygen saturations were monitored continuously. The                            [id number]-[id number] ([id number] Olympus endoscope was introduced                            through the mouth and advanced to the second part                            of duodenum. The [id number]-[id number] ([id number]) Olympus peds                            colonoscope was introduced through the mouth and                            advanced to the proximal jejunum. The small bowel                            enteroscopy was accomplished without difficulty.                            The patient tolerated the procedure. Scope In: Scope Out: Findings:      No gross lesions were noted in the entire esophagus.      The Z-line was irregular and was found 38 cm from the incisors.      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 Fulguration to ablate the lesion to prevent bleeding by       argon plasma was successful (Gastric settings). This led to some active       oozing for which further APC was performed. For assurance of hemostasis,       one hemostatic clip was successfully placed (MR conditional). There was       no bleeding at the end of the procedure.      No other gross lesions were noted in the entire examined stomach.      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       Fulguration to ablate the lesion to prevent bleeding by argon plasma was       successful (Rt colon settings).      Normal mucosa was found in the entire duodenum otherwise.      Three angiodysplastic  lesions with no bleeding were found in the       proximal jejunum. Fulguration to ablate the lesion to prevent bleeding       by argon plasma was successful (Rt colon settings).      Normal mucosa was found in the examined proximal jejunum as well. Area       was tattooed with an injection of Spot (carbon black) to demarcate the       distal extent of today's procedure.      . Impression:               - No gross lesions in esophagus. Z-line irregular,                            38 cm from the incisors.                           - A single non-bleeding angiodysplastic lesion in                            the stomach. Treated with argon plasma coagulation                            (APC). Clip (MR conditional) was placed.                           - No other gross lesions in the stomach.                           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 Treated with argon plasma coagulation                            (APC).                           - Normal mucosa was found in the entire examined  duodenum otherwise.                           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 Treated with argon plasma coagulation                            (APC).                           - Normal mucosa was found in the examined proximal                            jejunum. Tattooed distal extent of today's SBE. Recommendation:           - The patient will be observed post-procedure,                            until all discharge criteria are met.                           - Return patient to hospital ward for ongoing care.                           - Full liquid diet.                           - Transition back to PPI twice daily (home dose).                            Stop IV PPI.                           - IV Iron is recommended (have placed Pharmacy                             consult to aid in administering dose), since she                            has so many antibodies making transfusions more                            difficult.                           - Trend Hgb/Hct and BUN and INR into tomorrow. If                            patient has transfusion dependent anemia develop                            into Sunday, then we will likely recommend  Colonoscopy + VCE on Monday.                           - Would hold on anticoagulation into tomorrow                            (Coumadin) if possible but if deemed necessary can                            give IV heparin drip and monitor.                           - The findings and recommendations were discussed                            with the patient.                           - The findings and recommendations were discussed                            with the referring physician. Procedure Code(s):        --- Professional ---                           [id number], Small intestinal endoscopy, enteroscopy                            beyond second portion of duodenum, not including                            ileum; with control of bleeding (eg, injection,                            bipolar cautery, unipolar cautery, laser, heater                            probe, stapler, plasma coagulator)                           44799, Unlisted procedure, small intestine Diagnosis Code(s):        --- Professional ---                           K22.8, Other specified diseases of esophagus                           K31.819, Angiodysplasia of stomach and duodenum                            without bleeding                           D62, Acute posthemorrhagic anemia  K92.1, Melena (includes Hematochezia)                           R19.5, Other fecal abnormalities                           Z98.890, Other specified postprocedural states CPT copyright 2019 American Medical  Association. All rights reserved. The codes documented in this report are preliminary and upon coder review may  be revised to meet current compliance requirements. Justice Britain, MD 11/09/2021 4:10:54 PM Number of Addenda: 0

## 2021-11-09 NOTE — Interval H&P Note (Signed)
History and Physical Interval Note:  11/09/2021 2:34 PM  [name]  has presented today for surgery, with the diagnosis of Melena/acute blood loss anemia.  The various methods of treatment have been discussed with the patient and family. After consideration of risks, benefits and other options for treatment, the patient has consented to  Procedure(s): ENTEROSCOPY (N/A) as a surgical intervention.  The patient's history has been reviewed, patient examined, no change in status, stable for surgery.  I have reviewed the patient's chart and labs.  Questions were answered to the patient's satisfaction.     [employer]

## 2021-11-09 NOTE — Anesthesia Preprocedure Evaluation (Signed)
Anesthesia Evaluation  Patient identified by MRN, date of birth, ID band Patient awake    Reviewed: Allergy & Precautions, NPO status , Patient's Chart, lab work & pertinent test results  Airway Mallampati: II  TM Distance: >3 FB Neck ROM: Full    Dental  (+) Upper Dentures, Partial Lower   Pulmonary asthma , COPD, former smoker,    Pulmonary exam normal        Cardiovascular hypertension, Pt. on medications and Pt. on home beta blockers +CHF  + dysrhythmias + pacemaker + Cardiac Defibrillator  Rhythm:Regular Rate:Normal     Neuro/Psych Anxiety Depression    GI/Hepatic Neg liver ROS, PUD, GERD  Medicated,  Endo/Other  Hypothyroidism   Renal/GU   negative genitourinary   Musculoskeletal  (+) Arthritis , Fibromyalgia -  Abdominal Normal abdominal exam  (+)   Peds  Hematology  (+) Blood dyscrasia, anemia ,   Anesthesia Other Findings   Reproductive/Obstetrics                             Anesthesia Physical Anesthesia Plan  ASA: 3  Anesthesia Plan: MAC   Post-op Pain Management:    Induction: Intravenous  PONV Risk Score and Plan: 2 and Propofol infusion and Treatment may vary due to age or medical condition  Airway Management Planned: Simple Face Mask, Natural Airway and Nasal Cannula  Additional Equipment: None  Intra-op Plan:   Post-operative Plan:   Informed Consent: I have reviewed the patients History and Physical, chart, labs and discussed the procedure including the risks, benefits and alternatives for the proposed anesthesia with the patient or authorized representative who has indicated his/her understanding and acceptance.     Dental advisory given  Plan Discussed with: CRNA  Anesthesia Plan Comments: (Lab Results      Component                Value               Date                      WBC                      10.9 (H)            11/09/2021                 HGB                      9.5 (L)             11/09/2021                HCT                      27.8 (L)            11/09/2021                MCV                      83.5                11/09/2021                PLT  127 (L)             11/09/2021          )        Anesthesia Quick Evaluation

## 2021-11-09 NOTE — Evaluation (Signed)
Physical Therapy Evaluation Patient Details Name: [name] MRN: [id number] DOB: [birth year] Today's Date: 11/09/2021  History of Present Illness  Pt is a 82 y.o. femalea dmitted 11/08/21 with GIB, severe blood loss anemia (hgb 4.6). 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   Clinical Impression  Pt presents with an overall decrease in functional mobility secondary to above. PTA, pt mod indep with rollator, lives with supportive husband. Today, pt moving well with RW and min guard; pt happy for opportunity to walk outside of room; asymptomatic with activity. Pt would benefit from continued acute PT services to maximize functional mobility and independence prior to d/c home.     SpO2 97% on RA HR 80 Resting BP 135/50 Post-ambulation BP 154/61    Recommendations for follow up therapy are one component of a multi-disciplinary discharge planning process, led by the attending physician.  Recommendations may be updated based on patient status, additional functional criteria and insurance authorization.  Follow Up Recommendations No PT follow up    Assistance Recommended at Discharge Intermittent Supervision/Assistance  Patient can return home with the following  A little help with bathing/dressing/bathroom;Assistance with cooking/housework;Assist for transportation    Equipment Recommendations None recommended by PT  Recommendations for Other Services       Functional Status Assessment Patient has had a recent decline in their functional status and demonstrates the ability to make significant improvements in function in a reasonable and predictable amount of time.     Precautions / Restrictions Precautions Precautions: Fall Restrictions Weight Bearing Restrictions: No      Mobility  Bed Mobility Overal bed mobility: Modified Independent                  Transfers Overall transfer level: Needs assistance Equipment used:  Rolling walker (2 wheels) Transfers: Sit to/from Stand Sit to Stand: Supervision                Ambulation/Gait Ambulation/Gait assistance: Min guard, Min assist Gait Distance (Feet): 180 Feet Assistive device: Rolling walker (2 wheels) Gait Pattern/deviations: Step-through pattern, Decreased stride length Gait velocity: Decreased     General Gait Details: slow, mostly steady gait with RW and min guard for balance; 1x LOB requiring minA to correct; pt with good activity pacing, requesting return to room due to fatigue  Stairs            Wheelchair Mobility    Modified Rankin (Stroke Patients Only)       Balance Overall balance assessment: Needs assistance Sitting-balance support: No upper extremity supported Sitting balance-Leahy Scale: Good       Standing balance-Leahy Scale: Fair Standing balance comment: can static stand without UE support, preference for RW                             Pertinent Vitals/Pain Pain Assessment Pain Assessment: No/denies pain    Home Living Family/patient expects to be discharged to:: Private residence Living Arrangements: Spouse/significant other Available Help at Discharge: Family;Available 24 hours/day Type of Home: Mobile home Home Access: Stairs to enter Entrance Stairs-Rails: Right;Left;Can reach both Entrance Stairs-Number of Steps: 3   Home Layout: One level Home Equipment: Conservation officer, nature (2 wheels);Rollator (4 wheels);Cane - single point;Grab bars - tub/shower;Shower seat      Prior Function Prior Level of Function : Independent/Modified Independent             Mobility Comments: Mod indep  with rollator (used to use [facility]); can drive, but husband typically drives. retired Network engineer ADLs Comments: will sit and stand to shower as needed     Hand Dominance        Extremity/Trunk Assessment   Upper Extremity Assessment Upper Extremity Assessment: Generalized weakness    Lower Extremity  Assessment Lower Extremity Assessment: Generalized weakness       Communication   Communication: No difficulties  Cognition Arousal/Alertness: Awake/alert Behavior During Therapy: WFL for tasks assessed/performed Overall Cognitive Status: Within Functional Limits for tasks assessed                                          General Comments General comments (skin integrity, edema, etc.): SpO2 97% on RA, HR 80; resting/supine BP 135/50, post-ambulation BP 154/61    Exercises     Assessment/Plan    PT Assessment Patient needs continued PT services  PT Problem List Decreased strength;Decreased activity tolerance;Decreased balance;Decreased mobility       PT Treatment Interventions DME instruction;Gait training;Stair training;Functional mobility training;Therapeutic activities;Therapeutic exercise;Balance training;Patient/family education    PT Goals (Current goals can be found in the Care Plan section)  Acute Rehab PT Goals Patient Stated Goal: return home PT Goal Formulation: With patient Time For Goal Achievement: 11/23/21 Potential to Achieve Goals: Good    Frequency Min 3X/week     Co-evaluation               AM-PAC PT "6 Clicks" Mobility  Outcome Measure Help needed turning from your back to your side while in a flat bed without using bedrails?: None Help needed moving from lying on your back to sitting on the side of a flat bed without using bedrails?: None Help needed moving to and from a bed to a chair (including a wheelchair)?: A Little Help needed standing up from a chair using your arms (e.g., wheelchair or bedside chair)?: A Little Help needed to walk in hospital room?: A Little Help needed climbing 3-5 steps with a railing? : A Little 6 Click Score: 20    End of Session Equipment Utilized During Treatment: Gait belt Activity Tolerance: Patient tolerated treatment well Patient left: in chair;with call bell/phone within reach;with  chair alarm set;with family/visitor present Nurse Communication: Mobility status PT Visit Diagnosis: Other abnormalities of gait and mobility (R26.89);Muscle weakness (generalized) (M62.81)    Time: 0964-3838 PT Time Calculation (min) (ACUTE ONLY): 20 min   Charges:   PT Evaluation $PT Eval Moderate Complexity: [place], PT, DPT Acute Rehabilitation Services  Pager [phone] Office [place] 11/09/2021, 1:57 PM

## 2021-11-09 NOTE — Anesthesia Postprocedure Evaluation (Signed)
Anesthesia Post Note  Patient: [name]  Procedure(s) Performed: ENTEROSCOPY HOT HEMOSTASIS (ARGON PLASMA COAGULATION/BICAP) HEMOSTASIS CLIP PLACEMENT SUBMUCOSAL TATTOO INJECTION     Patient location during evaluation: PACU Anesthesia Type: General Level of consciousness: awake and alert Pain management: pain level controlled Vital Signs Assessment: post-procedure vital signs reviewed and stable Respiratory status: spontaneous breathing, nonlabored ventilation, respiratory function stable and patient connected to nasal cannula oxygen Cardiovascular status: blood pressure returned to baseline and stable Postop Assessment: no apparent nausea or vomiting Anesthetic complications: no   No notable events documented.  Last Vitals:  Vitals:   11/09/21 1536 11/09/21 1551  BP: (!) 130/40 (!) 115/49  Pulse: 72 71  Resp: (!) 25 18  Temp: 36.8 C   SpO2: 98% 100%    Last Pain:  Vitals:   11/09/21 1551  TempSrc:   PainSc: 0-No pain                 Belenda Cruise P Aviva Wolfer

## 2021-11-09 NOTE — Progress Notes (Addendum)
[place] NOTE  Pharmacy Consult for IV iron Indication: anemia  Allergies  Allergen Reactions   Ace Inhibitors Cough   Lipitor [Atorvastatin Calcium] Other (See Comments)    Knee pain    Megace [Megestrol] Other (See Comments)    High blood pressure and blurred vision   Aspirin Other (See Comments)    Nephrologist said to not take this   Clorazepate Dipotassium Other (See Comments)    Interacts with a drug being taken   Simvastatin Other (See Comments)    Muscle pain   Tiotropium Bromide Monohydrate Other (See Comments)    Dry mouth   Codeine Nausea And Vomiting   Tape Rash    Patient Measurements: Height: 5' 2"  (157.5 cm) Weight: 45.5 kg (100 lb 4.8 oz) IBW/kg (Calculated) : 50.1  Vital Signs: Temp: 98.2 F (36.8 C) (06/10 1536) Temp Source: Temporal (06/10 1536) BP: 115/49 (06/10 1551) Pulse Rate: 71 (06/10 1551) Intake/Output from previous day: 06/09 0701 - 06/10 0700 In: 1613.8 [P.O.:240; I.V.:201.8; Blood:1017.5; IV Piggyback:154.5] Out: 200 [Urine:200] Intake/Output from this shift: Total I/O In: 200 [I.V.:200] Out: -   Labs: Recent Labs    11/08/21 0716 11/08/21 0848 11/08/21 0955 11/08/21 2025 11/09/21 0330  WBC 11.0*  --   --  11.3* 9.2  HGB 4.6*  --   --  8.3* 6.8*  HCT 14.4*  --   --  24.0* 19.4*  PLT 233  --   --  155 144*  APTT  --  QUESTIONABLE RESULTS, RECOMMEND RECOLLECT TO VERIFY 66*  --   --   CREATININE 1.70*  --   --   --  1.43*  MG  --   --   --   --  1.3*  ALBUMIN 2.5*  --   --   --  1.9*  PROT 5.2*  --   --   --  3.9*  AST 23  --   --   --  20  ALT 16  --   --   --  12  ALKPHOS 47  --   --   --  39  BILITOT 0.5  --   --   --  0.9   Estimated Creatinine Clearance: 22.9 mL/min (A) (by C-G formula based on SCr of 1.43 mg/dL (H)).   Assessment: 28 YOF presented with rectal bleeding in setting of supra-therapeutic INR.  INR has been reversed and patient is s/p endoscopy, which showed a single nonbleeding lesion  in the stomach and three in the duodenum and all were treated with APC.    GI recommends Pharmacy to dose IV iron given many antibodies hindering the transfusion effort.  Patient does have CHF; however, she is euvolemic per EP's progress note.  Hemoglobin currently low at 6.8 g/dL and calculated iron deficit is ~1142m.  Per patient/RN, she received 5 units PRBCs, which would have provided 1000-12550mof iron.  Goal of Therapy:  Hemoglobin >/= 12 g/dL  Plan:  Venofer 50049mV x 1 F/u start PO iron when on a regular diet  Verlin Uher D. DanMina MarbleharmD, BCPS, BCC[place]uff[place]10/2023, 4:29 PM

## 2021-11-09 NOTE — Transfer of Care (Signed)
Immediate Anesthesia Transfer of Care Note  Patient: [name]  Procedure(s) Performed: ENTEROSCOPY HOT HEMOSTASIS (ARGON PLASMA COAGULATION/BICAP)  Patient Location: PACU  Anesthesia Type:MAC  Level of Consciousness: drowsy and patient cooperative  Airway & Oxygen Therapy: Patient Spontanous Breathing  Post-op Assessment: Report given to RN and Post -op Vital signs reviewed and stable  Post vital signs: Reviewed and stable  Last Vitals:  Vitals Value Taken Time  BP 130/40 11/09/21 1536  Temp 36.8 C 11/09/21 1536  Pulse 70 11/09/21 1536  Resp 25 11/09/21 1536  SpO2 98 % 11/09/21 1536  Vitals shown include unvalidated device data.  Last Pain:  Vitals:   11/09/21 1536  TempSrc: Temporal  PainSc: Asleep         Complications: No notable events documented.

## 2021-11-09 NOTE — Progress Notes (Signed)
Progress Note  Patient Name: [name] Date of Encounter: 11/09/2021  [facility] Cardiologist: Kathleen Burow, MD   Subjective   Continues to feel weak and fatigued.  No chest pain.  Inpatient Medications    Scheduled Meds:  levothyroxine  50 mcg Oral QAC breakfast   metoprolol succinate  12.5 mg Oral Daily   pravastatin  40 mg Oral QHS   Continuous Infusions:  pantoprazole 8 mg/hr (11/09/21 0631)   PRN Meds: acetaminophen **OR** acetaminophen, ALPRAZolam, hydrALAZINE, ondansetron **OR** ondansetron (ZOFRAN) IV, polyethylene glycol   Vital Signs    Vitals:   11/09/21 0406 11/09/21 0557 11/09/21 0615 11/09/21 0812  BP:  (!) 121/56 (!) 126/52 114/77  Pulse:  89  85  Resp: 16 18 18 19   Temp: 98 F (36.7 C) 98.1 F (36.7 C) 98 F (36.7 C) 98 F (36.7 C)  TempSrc: Oral Oral Oral Oral  SpO2:  98%  98%  Weight:      Height:        Intake/Output Summary (Last 24 hours) at 11/09/2021 0915 Last data filed at 11/09/2021 0700 Gross per 24 hour  Intake 1513.81 ml  Output 200 ml  Net 1313.81 ml      11/08/2021    4:38 PM 11/07/2021    9:22 AM 10/09/2021    9:20 AM  Last 3 Weights  Weight (lbs) 100 lb 4.8 oz 98 lb 3.2 oz 103 lb 12.8 oz  Weight (kg) 45.496 kg 44.543 kg 47.083 kg      Telemetry    Sinus rhythm, ventricular paced- Personally Reviewed  ECG    Evaluated- Personally Reviewed  Physical Exam   GEN: No acute distress.   Neck: No JVD Cardiac: RRR, no murmurs, rubs, or gallops.  Respiratory: Clear to auscultation bilaterally. GI: Soft, nontender, non-distended  MS: No edema; No deformity. Neuro:  Nonfocal  Psych: Normal affect   Labs    High Sensitivity Troponin:   Recent Labs  Lab 11/08/21 0848  TROPONINIHS 11     Chemistry Recent Labs  Lab 11/08/21 0716 11/09/21 0330  NA 134* 136  K 3.8 4.1  CL 101 106  CO2 22 24  GLUCOSE 148* 90  BUN 61* 42*  CREATININE 1.70* 1.43*  CALCIUM 8.1* 7.2*  MG  --  1.3*  PROT 5.2* 3.9*   ALBUMIN 2.5* 1.9*  AST 23 20  ALT 16 12  ALKPHOS 47 39  BILITOT 0.5 0.9  GFRNONAA 30* 37*  ANIONGAP 11 6    Lipids No results for input(s): "CHOL", "TRIG", "HDL", "LABVLDL", "LDLCALC", "CHOLHDL" in the last 168 hours.  Hematology Recent Labs  Lab 11/08/21 0716 11/08/21 2025 11/09/21 0330  WBC 11.0* 11.3* 9.2  RBC 1.48* 2.83* 2.29*  HGB 4.6* 8.3* 6.8*  HCT 14.4* 24.0* 19.4*  MCV 97.3 84.8 84.7  MCH 31.1 29.3 29.7  MCHC 31.9 34.6 35.1  RDW 15.8* 16.2* 16.4*  PLT 233 155 144*   Thyroid No results for input(s): "TSH", "FREET4" in the last 168 hours.  BNPNo results for input(s): "BNP", "PROBNP" in the last 168 hours.  DDimer No results for input(s): "DDIMER" in the last 168 hours.   Radiology    DG Chest Portable 1 View  Result Date: 11/08/2021 CLINICAL DATA:  SOB anemia EXAM: PORTABLE CHEST 1 VIEW COMPARISON:  Oct 20, 2019 FINDINGS: As before, there are left subclavian cardiac pacers with lead tip over the right atrium and right ventricle. Epicardial pacing wires noted. Again seen are sternotomy  wires and cardiac valve replacement. No cardiomegaly. Mediastinal contours are within normal limits. No focal consolidation or pleural effusion. There is mild central pulmonary vascular dilatation seen. 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 There has been interval left shoulder prosthesis with apparent minimal widening at the prosthetic glenohumeral joint. IMPRESSION: Mild central pulmonary vascular congestion. No pleural effusion or consolidation. There has been interval left shoulder prosthesis with apparent minimal widening at the prosthetic glenohumeral joint. Clinical correlation is suggested. Electronically Signed   By: [name] M.D.   On: 11/08/2021 09:47    Cardiac Studies   Echo 10/30/21  1. Left ventricular ejection fraction, by estimation, is 30 to 35%. The  left ventricle has moderately decreased function. The left ventricle  demonstrates global hypokinesis. Left  ventricular diastolic parameters are  indeterminate.   2. Right ventricular systolic function is normal. The right ventricular  size is normal. Tricuspid regurgitation signal is inadequate for assessing  PA pressure.   3. Left atrial size was mildly dilated.   4. The mitral valve has been repaired/replaced. Trivial mitral valve  regurgitation. The mean mitral valve gradient is 3.0 mmHg with average  heart rate of 70 bpm. There is a 25 mm St. [name] mechanical valve present  in the mitral position. Procedure Date:  02/26/2006.   5. The aortic valve is tricuspid. Aortic valve regurgitation is mild.  Aortic valve sclerosis is present, with no evidence of aortic valve  stenosis.   6. The inferior vena cava is normal in size with greater than 50%  respiratory variability, suggesting right atrial pressure of 3 mmHg.   Patient Profile     81 y.o. female with a history of mechanical mitral valve, biventricular systolic heart failure who presented to the hospital with upper GI bleed and severe anemia with an elevated INR.  Assessment & Plan    1.  Upper GI bleed: INR was greater than 10.  She has been receiving both FFP and red blood cells.  Plan for endoscopy today.  2.  History of mechanical mitral valve replacement in 2007: Currently on warfarin.  Being held due to GI bleeding and supratherapeutic INR.  [name] need to resume warfarin as soon as possible per GI recommendations as she does have mechanical valve.  3.  Chronic systolic heart failure: Ejection fraction 30 to 35%.  Appears euvolemic on exam.  We [name] continue heart failure medications as indicated and adjust per blood pressure.  4.  Paroxysmal atrial fibrillation: Less than 1% burden on interrogation.  Resume Coumadin as above.      For questions or updates, please contact [name] Please consult [email or web address] for contact info under        Signed, Kathleen Palos Meredith Leeds, MD  11/09/2021, 9:15 AM

## 2021-11-09 NOTE — Progress Notes (Signed)
PROGRESS NOTE                                                                                                                                                                                                             Patient Demographics:    [name], is a 81 y.o. female, DOB - 1940/07/12, DDU:[id number]  Outpatient Primary MD for the patient is [name], MD    LOS - 1  Admit date - 11/08/2021    Chief Complaint  Patient presents with   Rectal Bleeding       Brief Narrative (HPI from H&P)   81 y.o. female, seen [name] mitral valve replaced in 2002 on Coumadin, biventricular pacemaker/defibrillator - Medtronic placed November 2376, chronic systolic and diastolic CHF with a EF of around 45%, history of recurrent upper GI bleeds, dyslipidemia, hypertension who has been having intermittent black stools for the last 5 days presents to the hospital for the same reason.  In the ED ER work-up suggestive of ongoing upper GI bleed causing severe blood loss related anemia with hemoglobin of 4.6.  GI, cardiology were consulted and I was requested to admit the patient.   Subjective:    [name] today has, No headache, No chest pain, No abdominal pain - No Nausea, No new weakness tingling or numbness, no shortness of breath, she had some bright red blood in her stool morning of 11/09/2021.   Assessment  & Plan :    Acute ongoing upper GI bleed with blood loss related anemia.  Her offending medication Coumadin has been stopped, her INR was supratherapeutic which has been reversed with IV vitamin K on 11/08/2021, INR within normal limits no need for FFP, she has received 3 units of packed RBC on 11/08/2021 and is getting 2 units of packed RBC morning of 11/09/2021, still having some bright red blood per rectum, on IV PPI drip both cardiology and GI following.  Continue to monitor CBC transfuse as needed.  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  2.  History of Saint [name] mitral valve replacement in 2002 on Coumadin.  INR at the time of  admission was over 10 has been reversed as above.  Cardiology following.  Once GI bleeding subsides will try to challenge her with heparin drip without bolus.  Patient and family understand the risks and benefits of holding anticoagulation.  3.  History of chronic combined systolic and diastolic CHF.  EF has now improved to 45-50% with biventricular pacemaker/defibrillator.  Currently compensated.  As needed Lasix for now, blood pressure soft hold blood pressure medications, currently no ACE/ARB/Entresto due to AKI.    4.  AKI.  Due to #1 above.  Transfuse and monitor.  Hold nephrotoxins.  5.  Dyslipidemia.  On home dose statin.  6.  Hypothyroidism.  Continue home dose Synthroid.  7.  Hypomagnesemia.  Replaced.      Condition - Extremely Guarded  Family Communication  : Husband bedside on the day of admission on 11/08/2021  Code Status : Full code  Consults  : GI, cardiology  PUD Prophylaxis : IV PPI   Procedures  :            Disposition Plan  :    Status is: Inpatient  DVT Prophylaxis  :    SCDs Start: 11/08/21 1045    Lab Results  Component Value Date   PLT 144 (L) 11/09/2021    Diet :  Diet Order             Diet NPO time specified  Diet effective midnight                    Inpatient Medications  Scheduled Meds:  levothyroxine  50 mcg Oral QAC breakfast   metoprolol succinate  12.5 mg Oral Daily   pravastatin  40 mg Oral QHS   Continuous Infusions:  magnesium sulfate bolus IVPB 4 g (11/09/21 0712)   pantoprazole 8 mg/hr (11/09/21 0631)   PRN Meds:.acetaminophen **OR** acetaminophen, ALPRAZolam, hydrALAZINE, ondansetron **OR** ondansetron (ZOFRAN) IV, polyethylene glycol  Antibiotics  :    Anti-infectives (From admission, onward)    None        Time Spent in minutes  30   Lala Lund M.D on 11/09/2021  at 8:44 AM  To page go to [email or web address]   [facility] -  Office  [phone]  See all Orders from today for further details    Objective:   Vitals:   11/09/21 0406 11/09/21 0557 11/09/21 0615 11/09/21 0812  BP:  (!) 121/56 (!) 126/52 114/77  Pulse:  89  85  Resp: 16 18 18 19   Temp: 98 F (36.7 C) 98.1 F (36.7 C) 98 F (36.7 C) 98 F (36.7 C)  TempSrc: Oral Oral Oral Oral  SpO2:  98%  98%  Weight:      Height:        Wt Readings from Last 3 Encounters:  11/08/21 45.5 kg  11/07/21 44.5 kg  10/09/21 47.1 kg     Intake/Output Summary (Last 24 hours) at 11/09/2021 0844 Last data filed at 11/09/2021 0557 Gross per 24 hour  Intake 1613.81 ml  Output 200 ml  Net 1413.81 ml     Physical Exam  Awake Alert, No new F.N deficits, Normal affect [place].AT,PERRAL Supple Neck, No JVD,   Symmetrical Chest wall movement, Good air movement bilaterally, CTAB RRR,No Gallops,Rubs or new Murmurs,  +ve B.Sounds, Abd Soft, No tenderness,   No Cyanosis, Clubbing or edema       Data Review:    CBC Recent Labs  Lab 11/08/21 0716 11/08/21 2025 11/09/21 0330  WBC 11.0* 11.3* 9.2  HGB 4.6* 8.3* 6.8*  HCT 14.4* 24.0* 19.4*  PLT 233 155 144*  MCV 97.3 84.8 84.7  MCH 31.1 29.3 29.7  MCHC 31.9 34.6 35.1  RDW 15.8* 16.2* 16.4*  LYMPHSABS  --   --  1.0  MONOABS  --   --  0.8  EOSABS  --   --  0.3  BASOSABS  --   --  0.0    Electrolytes Recent Labs  Lab 11/08/21 0716 11/08/21 0955 11/08/21 2025 11/09/21 0330  NA 134*  --   --  136  K 3.8  --   --  4.1  CL 101  --   --  106  CO2 22  --   --  24  GLUCOSE 148*  --   --  90  BUN 61*  --   --  42*  CREATININE 1.70*  --   --  1.43*  CALCIUM 8.1*  --   --  7.2*  AST 23  --   --  20  ALT 16  --   --  12  ALKPHOS 47  --   --  39  BILITOT 0.5  --   --  0.9  ALBUMIN 2.5*  --   --  1.9*  MG  --   --   --  1.3*  INR  --  >10.0* 2.5* 1.9*     ------------------------------------------------------------------------------------------------------------------ No results for input(s): "CHOL", "HDL", "LDLCALC", "TRIG", "CHOLHDL", "LDLDIRECT" in the last 72 hours.  No results found for: "HGBA1C"  No results for input(s): "TSH", "T4TOTAL", "T3FREE", "THYROIDAB" in the last 72 hours.  Invalid input(s): "FREET3" ------------------------------------------------------------------------------------------------------------------ ID Labs Recent Labs  Lab 11/08/21 0716 11/08/21 2025 11/09/21 0330  WBC 11.0* 11.3* 9.2  PLT 233 155 144*  CREATININE 1.70*  --  1.43*   Cardiac Enzymes No results for input(s): "CKMB", "TROPONINI", "MYOGLOBIN" in the last 168 hours.  Invalid input(s): "CK"       Micro Results No results found for this or any previous visit (from the past 240 hour(s)).  Radiology Reports DG Chest Portable 1 View  Result Date: 11/08/2021 CLINICAL DATA:  SOB anemia EXAM: PORTABLE CHEST 1 VIEW COMPARISON:  Oct 20, 2019 FINDINGS: As before, there are left subclavian cardiac pacers with lead tip over the right atrium and right ventricle. Epicardial pacing wires noted. Again seen are sternotomy wires and cardiac valve replacement. No cardiomegaly. Mediastinal contours are within normal limits. No focal consolidation or pleural effusion. There is mild central pulmonary vascular dilatation seen. 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 There has been interval left shoulder prosthesis with apparent minimal widening at the prosthetic glenohumeral joint. IMPRESSION: Mild central pulmonary vascular congestion. No pleural effusion or consolidation. There has been interval left shoulder prosthesis with apparent minimal widening at the prosthetic glenohumeral joint. Clinical correlation is suggested. Electronically Signed   By: Frazier Richards M.D.   On: 11/08/2021 09:47

## 2021-11-10 DIAGNOSIS — K922 Gastrointestinal hemorrhage, unspecified: Secondary | ICD-10-CM | POA: Diagnosis not present

## 2021-11-10 LAB — PREPARE FRESH FROZEN PLASMA
Unit division: 0
Unit division: 0

## 2021-11-10 LAB — CBC
HCT: 25 % — ABNORMAL LOW (ref 36.0–46.0)
Hemoglobin: 8.4 g/dL — ABNORMAL LOW (ref 12.0–15.0)
MCH: 28.1 pg (ref 26.0–34.0)
MCHC: 33.6 g/dL (ref 30.0–36.0)
MCV: 83.6 fL (ref 80.0–100.0)
Platelets: 128 10*3/uL — ABNORMAL LOW (ref 150–400)
RBC: 2.99 MIL/uL — ABNORMAL LOW (ref 3.87–5.11)
RDW: 16.3 % — ABNORMAL HIGH (ref 11.5–15.5)
WBC: 9.3 10*3/uL (ref 4.0–10.5)
nRBC: 0.4 % — ABNORMAL HIGH (ref 0.0–0.2)

## 2021-11-10 LAB — CBC WITH DIFFERENTIAL/PLATELET
Abs Immature Granulocytes: 0.06 10*3/uL (ref 0.00–0.07)
Basophils Absolute: 0 10*3/uL (ref 0.0–0.1)
Basophils Relative: 0 %
Eosinophils Absolute: 0.3 10*3/uL (ref 0.0–0.5)
Eosinophils Relative: 3 %
HCT: 26.1 % — ABNORMAL LOW (ref 36.0–46.0)
Hemoglobin: 8.8 g/dL — ABNORMAL LOW (ref 12.0–15.0)
Immature Granulocytes: 1 %
Lymphocytes Relative: 5 %
Lymphs Abs: 0.5 10*3/uL — ABNORMAL LOW (ref 0.7–4.0)
MCH: 28.6 pg (ref 26.0–34.0)
MCHC: 33.7 g/dL (ref 30.0–36.0)
MCV: 84.7 fL (ref 80.0–100.0)
Monocytes Absolute: 0.8 10*3/uL (ref 0.1–1.0)
Monocytes Relative: 7 %
Neutro Abs: 10.1 10*3/uL — ABNORMAL HIGH (ref 1.7–7.7)
Neutrophils Relative %: 84 %
Platelets: 142 10*3/uL — ABNORMAL LOW (ref 150–400)
RBC: 3.08 MIL/uL — ABNORMAL LOW (ref 3.87–5.11)
RDW: 16.7 % — ABNORMAL HIGH (ref 11.5–15.5)
WBC: 11.8 10*3/uL — ABNORMAL HIGH (ref 4.0–10.5)
nRBC: 0.3 % — ABNORMAL HIGH (ref 0.0–0.2)

## 2021-11-10 LAB — COMPREHENSIVE METABOLIC PANEL
ALT: 11 U/L (ref 0–44)
AST: 24 U/L (ref 15–41)
Albumin: 2 g/dL — ABNORMAL LOW (ref 3.5–5.0)
Alkaline Phosphatase: 48 U/L (ref 38–126)
Anion gap: 6 (ref 5–15)
BUN: 31 mg/dL — ABNORMAL HIGH (ref 8–23)
CO2: 24 mmol/L (ref 22–32)
Calcium: 7.6 mg/dL — ABNORMAL LOW (ref 8.9–10.3)
Chloride: 104 mmol/L (ref 98–111)
Creatinine, Ser: 1.28 mg/dL — ABNORMAL HIGH (ref 0.44–1.00)
GFR, Estimated: 43 mL/min — ABNORMAL LOW (ref >60)
Glucose, Bld: 117 mg/dL — ABNORMAL HIGH (ref 70–99)
Potassium: 4.5 mmol/L (ref 3.5–5.1)
Sodium: 134 mmol/L — ABNORMAL LOW (ref 135–145)
Total Bilirubin: 0.5 mg/dL (ref 0.3–1.2)
Total Protein: 4 g/dL — ABNORMAL LOW (ref 6.5–8.1)

## 2021-11-10 LAB — BPAM FFP
Blood Product Expiration Date: 202306142359
Blood Product Expiration Date: 202306142359
ISSUE DATE / TIME: 202306090337
ISSUE DATE / TIME: 202306090337
Unit Type and Rh: 5100
Unit Type and Rh: 5100

## 2021-11-10 LAB — MAGNESIUM: Magnesium: 2.2 mg/dL (ref 1.7–2.4)

## 2021-11-10 LAB — PROTIME-INR
INR: 2.2 — ABNORMAL HIGH (ref 0.8–1.2)
Prothrombin Time: 24 seconds — ABNORMAL HIGH (ref 11.4–15.2)

## 2021-11-10 MED ORDER — WARFARIN SODIUM 2.5 MG PO TABS
2.5000 mg | ORAL_TABLET | Freq: Once | ORAL | Status: AC
  Administered 2021-11-10: 2.5 mg via ORAL
  Filled 2021-11-10: qty 1

## 2021-11-10 MED ORDER — WARFARIN - PHARMACIST DOSING INPATIENT: Freq: Every day | Status: DC

## 2021-11-10 NOTE — Progress Notes (Signed)
ANTICOAGULATION CONSULT NOTE - Initial Consult  Pharmacy Consult for Warfarin Indication:  Hx St [name] mitral valve replacement  Allergies  Allergen Reactions   Ace Inhibitors Cough   Lipitor [Atorvastatin Calcium] Other (See Comments)    Knee pain    Megace [Megestrol] Other (See Comments)    High blood pressure and blurred vision   Aspirin Other (See Comments)    Nephrologist said to not take this   Clorazepate Dipotassium Other (See Comments)    Interacts with a drug being taken   Simvastatin Other (See Comments)    Muscle pain   Tiotropium Bromide Monohydrate Other (See Comments)    Dry mouth   Codeine Nausea And Vomiting   Tape Rash    Patient Measurements: Height: 5' 2"  (157.5 cm) Weight: 45.5 kg (100 lb 4.8 oz) IBW/kg (Calculated) : 50.1  Vital Signs: Temp: 97.9 F (36.6 C) (06/11 0555) Temp Source: Oral (06/11 0555) BP: 124/54 (06/11 0813) Pulse Rate: 81 (06/11 0813)  Labs: Recent Labs    11/08/21 0716 11/08/21 0848 11/08/21 0955 11/08/21 2025 11/09/21 0330 11/09/21 1630 11/09/21 1822 11/10/21 0043 11/10/21 0731  HGB 4.6*  --   --    < > 6.8* 9.5* 9.5* 8.4* 8.8*  HCT 14.4*  --   --    < > 19.4* 27.8* 28.4* 25.0* 26.1*  PLT 233  --   --    < > 144* 127* 135* 128* 142*  APTT  --  QUESTIONABLE RESULTS, RECOMMEND RECOLLECT TO VERIFY 66*  --   --   --   --   --   --   LABPROT  --   --  >90.0*   < > 22.0* 20.3*  --   --  24.0*  INR  --   --  >10.0*   < > 1.9* 1.8*  --   --  2.2*  CREATININE 1.70*  --   --   --  1.43*  --   --   --  1.28*  TROPONINIHS  --  11  --   --   --   --   --   --   --    < > = values in this interval not displayed.    Estimated Creatinine Clearance: 25.6 mL/min (A) (by C-G formula based on SCr of 1.28 mg/dL (H)).   Medical History: Past Medical History:  Diagnosis Date   Allergy    seasonal   Anemia    Anxiety    Arthritis    Asthma    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 Protecta    Biventricular ICD (implantable cardioverter-defibrillator) in place    with CRT   Blood transfusion without reported diagnosis    Bursitis    Cataract    RIGHT EYE   CHF (congestive heart failure) ([place])    Chronic kidney disease    Colon polyp    adenomatous   Complication of anesthesia    patient stated that had difficulty getting the breathing tube removed, patient said that she stopped breathing and HR dropped to 10  patient then woke up and started breathing pateint stated no longer than one minute; re-intubated in PACU following cholecystectomy 10/28/13   COPD (chronic obstructive pulmonary disease) ([place])    Depression    Diverticulosis    Dysrhythmia    Fibromyalgia    GERD (gastroesophageal reflux disease)    Gout    H/O mitral valve replacement 2002, 2007   Heart  murmur    Hemorrhoids    Hyperlipidemia    Hypertension    Hypothyroidism    IBS (irritable bowel syndrome)    MVP (mitral valve prolapse)    Neuromuscular disorder (H[facility])    fibromyalgia   Pacemaker    Peptic ulcer disease     Medications:  Scheduled:   levothyroxine  50 mcg Oral QAC breakfast   metoprolol succinate  12.5 mg Oral Daily   pantoprazole  20 mg Oral BID AC   pravastatin  40 mg Oral QHS    Assessment: 81 yo F presenting with acute ongoing upper GI bleed with blood loss related anemia. Patient is on Warfarin PTA for hx St [name] mitral valve replacement. Last PTA Warfarin dose was on 6/8 at ~1830.   INR on admission was supratherapeutic at > 10, and reversed with IV vitamin K on 6/9. 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 H&H stable, per GI - ok to resume Warfarin on 6/11. Pharmacy consulted for Warfarin dosing.   INR today is subtherapeutic at 2.2. Hgb 8.8, plt 142 - stable. Per RN - patient is still having bloody stools, but has improved and no longer dark colored.   PTA Warfarin Regimen: 2.5 mg on MWF, and 5 mg on all other  days  Goal of Therapy:  INR 2.5-3.5 Monitor platelets by anticoagulation protocol: Yes   Plan:  Warfarin 2.5 mg PO x 1 tonight.  Daily INR. Monitor CBC and for signs/symptoms of bleeding.   Vance Peper, PharmD PGY1 Pharmacy Resident Phone [phone] 11/10/2021 9:47 AM   Please check AMION for all [place] phone numbers After 10:00 PM, call [place] [phone]

## 2021-11-10 NOTE — Progress Notes (Signed)
[facility] Progress Note  CC:  GI bleed  Subjective:  Looks great this morning.  Says that she feels great.  Still having dark stools but she says that it is just little bits at a time and seems like old blood.    Objective:  Vital signs in last 24 hours: Temp:  [97.5 F (36.4 C)-98.2 F (36.8 C)] 97.9 F (36.6 C) (06/11 0555) Pulse Rate:  [71-92] 81 (06/11 0813) Resp:  [18-25] 18 (06/11 0813) BP: (115-151)/(39-70) 124/54 (06/11 0813) SpO2:  [96 %-100 %] 96 % (06/11 0813) Last BM Date : 11/10/21 General:  Alert, Well-developed, in NAD Heart:  Regular rate and rhythm; valve click noted. Pulm:  CTAB.  No W/R/R. Abdomen:  Soft, non-distended.  BS present.  Non-tender. Extremities:  Without edema. Neurologic:  Alert and oriented x 4;  grossly normal neurologically. Psych:  Alert and cooperative. Normal mood and affect.  Intake/Output from previous day: 06/10 0701 - 06/11 0700 In: 200 [I.V.:200] Out: -  Intake/Output this shift: Total I/O In: 236 [P.O.:236] Out: -   Lab Results: Recent Labs    11/09/21 1822 11/10/21 0043 11/10/21 0731  WBC 10.4 9.3 11.8*  HGB 9.5* 8.4* 8.8*  HCT 28.4* 25.0* 26.1*  PLT 135* 128* 142*   BMET Recent Labs    11/08/21 0716 11/09/21 0330 11/10/21 0731  NA 134* 136 134*  K 3.8 4.1 4.5  CL 101 106 104  CO2 22 24 24   GLUCOSE 148* 90 117*  BUN 61* 42* 31*  CREATININE 1.70* 1.43* 1.28*  CALCIUM 8.1* 7.2* 7.6*   LFT Recent Labs    11/10/21 0731  PROT 4.0*  ALBUMIN 2.0*  AST 24  ALT 11  ALKPHOS 48  BILITOT 0.5   PT/INR Recent Labs    11/09/21 1630 11/10/21 0731  LABPROT 20.3* 24.0*  INR 1.8* 2.2*   DG Chest Portable 1 View  Result Date: 11/08/2021 CLINICAL DATA:  SOB anemia EXAM: PORTABLE CHEST 1 VIEW COMPARISON:  Oct 20, 2019 FINDINGS: As before, there are left subclavian cardiac pacers with lead tip over the right atrium and right ventricle. Epicardial pacing wires noted. Again seen are sternotomy  wires and cardiac valve replacement. No cardiomegaly. Mediastinal contours are within normal limits. No focal consolidation or pleural effusion. There is mild central pulmonary vascular dilatation seen. 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 There has been interval left shoulder prosthesis with apparent minimal widening at the prosthetic glenohumeral joint. IMPRESSION: Mild central pulmonary vascular congestion. No pleural effusion or consolidation. There has been interval left shoulder prosthesis with apparent minimal widening at the prosthetic glenohumeral joint. Clinical correlation is suggested. Electronically Signed   By: Frazier Richards M.D.   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  Now with an almost 5 g drop in hemoglobin to 4.6 g with dark stools that began 4-5 days ago at this point.  Melena with Hemoccult positive stools on exam.  BUN is elevated.  Now that we see that her INR has returned at >16 certainly she could have bled anywhere in the GI tract, even mucosal oozing.  She has received 5 units PRBCs.  Small bowel enteroscopy 6/10 showed a single nonbleeding AVM in the stomach and 3 nonbleeding AVMs in the duodenum to which APC was applied.  Hemoglobin is stable at 8.8 g this morning.  Received a dose of Venofer as well. *Coumadin coagulopathy for mechanical valve: INR >10 on presentation.  INR 2.2 this morning.  -PPI changed back to her home dose of 20 mg twice daily. -Monitor Hgb.   -If sign of re-bleeding then colonoscopy.   LOS: 2 days   [name]. [name]  11/10/2021, 9:29 AM

## 2021-11-10 NOTE — Progress Notes (Signed)
PROGRESS NOTE                                                                                                                                                                                                             Patient Demographics:    [name], is a 81 y.o. female, DOB - 04-02-1941, YTK:[id number]  Outpatient Primary MD for the patient is Kathleen Lopes, MD    LOS - 2  Admit date - 11/08/2021    Chief Complaint  Patient presents with   Rectal Bleeding       Brief Narrative (HPI from H&P)   81 y.o. 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  In the ED ER work-up suggestive of ongoing upper GI bleed causing severe blood loss related anemia with hemoglobin of 4.6.  GI, cardiology were consulted and I was requested to admit the patient.   Subjective:   Patient in bed, appears comfortable, denies any headache, no fever, no chest pain or pressure, no shortness of breath , no abdominal pain. No focal weakness.   Assessment  & Plan :    Acute ongoing upper GI bleed with blood loss related anemia.  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  Continue to  monitor CBCs and clinically.  2.  History of Saint [name] replacement in 2002 on Coumadin.  INR at the time of admission was over 10 has been reversed  as above.  Cardiology following.  Coumadin was started on 11/10/2021.  3.  History of chronic combined systolic and diastolic CHF.  EF has now improved to 45-50% with biventricular pacemaker/defibrillator.  Currently compensated.  As needed Lasix for now, blood pressure soft hold blood pressure medications, currently no ACE/ARB/Entresto due to AKI.    4.  AKI.  Due to #1 above.  Transfuse and monitor.  Hold nephrotoxins.  Renal function is improving.  5.  Dyslipidemia.  On home dose statin.  6.  Hypothyroidism.  Continue home dose Synthroid.  7.  Hypomagnesemia.  Replaced.      Condition - Extremely Guarded  Family Communication  : Husband bedside on the day of admission on 11/08/2021  Code Status : Full code  Consults  : GI, cardiology  PUD Prophylaxis : IV PPI   Procedures  :      EGD -  Impression:               - No gross lesions in esophagus. Z-line irregular,                            38 cm from the incisors.                           - A single non-bleeding angiodysplastic lesion in                            the stomach. Treated with argon plasma coagulation                            (APC). Clip (MR conditional) was placed.                           - No other gross lesions in the stomach.                           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 Treated with argon plasma coagulation                            (APC).                           - Normal mucosa was found in the entire examined                            duodenum otherwise.                           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 Treated with argon plasma coagulation                            (APC).                           -  Normal mucosa was found in the examined proximal                            jejunum. Tattooed distal extent of today's SBE. Recommendation:           - The patient will be observed post-procedure,                            until all discharge criteria are met.                           - Return patient to hospital ward for ongoing care.                           - Full liquid diet.                           - Transition back to PPI twice daily (home dose).                            Stop IV PPI.                           - IV Iron is recommended (have placed Pharmacy                            consult to aid in administering dose), since she                            has so many antibodies making transfusions more                            difficult.                           - Trend Hgb/Hct and BUN and INR into tomorrow. If                            patient has transfusion dependent anemia develop                            into Sunday, then we will likely recommend                            Colonoscopy + VCE on Monday.                           - Would hold on anticoagulation into tomorrow                            (Coumadin) if possible but if deemed necessary can                            give IV heparin drip and monitor.                           -  The findings and recommendations were discussed                            with the patient.                           - The findings and recommendations were discussed                            with the referring physician.      Disposition Plan  :    Status is: Inpatient  DVT Prophylaxis  :    SCDs Start: 11/08/21 1045    Lab Results  Component Value Date   PLT 142 (L) 11/10/2021    Diet :  Diet Order             Diet full liquid Room service appropriate? Yes; Fluid consistency: Thin  Diet effective now                    Inpatient Medications  Scheduled Meds:  levothyroxine  50 mcg Oral QAC breakfast    metoprolol succinate  12.5 mg Oral Daily   pantoprazole  20 mg Oral BID AC   pravastatin  40 mg Oral QHS   Continuous Infusions:   PRN Meds:.acetaminophen **OR** acetaminophen, ALPRAZolam, hydrALAZINE, ondansetron **OR** ondansetron (ZOFRAN) IV, polyethylene glycol  Antibiotics  :    Anti-infectives (From admission, onward)    None        Time Spent in minutes  30   Lala Lund M.D on 11/10/2021 at 9:37 AM  To page go to [email or web address]   [facility] -  Office  [phone]  See all Orders from today for further details    Objective:   Vitals:   11/09/21 2012 11/10/21 0022 11/10/21 0555 11/10/21 0813  BP: (!) 134/46 (!) 127/51 (!) 149/70 (!) 124/54  Pulse: 75 73 92 81  Resp: _0 Temp: 98.2 F (36.8 C) 97.9 F (36.6 C) 97.9 F (36.6 C)   TempSrc: Oral Oral Oral   SpO2: 100% 98% 97% 96%  Weight:      Height:        Wt Readings from Last 3 Encounters:  11/08/21 45.5 kg  11/07/21 44.5 kg  10/09/21 47.1 kg     Intake/Output Summary (Last 24 hours) at 11/10/2021 0937 Last data filed at 11/10/2021 0800 Gross per 24 hour  Intake 436 ml  Output --  Net 436 ml     Physical Exam  Awake Alert, No new F.N deficits, Normal affect Center.AT,PERRAL Supple Neck, No JVD,   Symmetrical Chest wall movement, Good air movement bilaterally, CTAB RRR,No Gallops, Rubs or new Murmurs,  +ve B.Sounds, Abd Soft, No tenderness,   No Cyanosis, Clubbing or edema     Data Review:    CBC Recent Labs  Lab 11/09/21 0330 11/09/21 1630 11/09/21 1822 11/10/21 0043 11/10/21 0731  WBC 9.2 10.9* 10.4 9.3 11.8*  HGB 6.8* 9.5* 9.5* 8.4* 8.8*  HCT 19.4* 27.8* 28.4* 25.0* 26.1*  PLT 144* 127* 135* 128* 142*  MCV 84.7 83.5 85.3 83.6 84.7  MCH 29.7 28.5 28.5 28.1 28.6  MCHC 35.1 34.2 33.5 33.6 33.7  RDW 16.4* 16.1* 16.3* 16.3* 16.7*  LYMPHSABS 1.0  --   --   --  0.5*  MONOABS 0.8  --   --   --  0.8  EOSABS 0.3  --   --   --  0.3  BASOSABS 0.0  --   --   --   0.0    Electrolytes Recent Labs  Lab 11/08/21 0716 11/08/21 0955 11/08/21 2025 11/09/21 0330 11/09/21 1630 11/10/21 0731  NA 134*  --   --  136  --  134*  K 3.8  --   --  4.1  --  4.5  CL 101  --   --  106  --  104  CO2 22  --   --  24  --  24  GLUCOSE 148*  --   --  90  --  117*  BUN 61*  --   --  42*  --  31*  CREATININE 1.70*  --   --  1.43*  --  1.28*  CALCIUM 8.1*  --   --  7.2*  --  7.6*  AST 23  --   --  20  --  24  ALT 16  --   --  12  --  11  ALKPHOS 47  --   --  39  --  48  BILITOT 0.5  --   --  0.9  --  0.5  ALBUMIN 2.5*  --   --  1.9*  --  2.0*  MG  --   --   --  1.3*  --  2.2  INR  --  >10.0* 2.5* 1.9* 1.8* 2.2*    Radiology Reports DG Chest Portable 1 View  Result Date: 11/08/2021 CLINICAL DATA:  SOB anemia EXAM: PORTABLE CHEST 1 VIEW COMPARISON:  Oct 20, 2019 FINDINGS: As before, there are left subclavian cardiac pacers with lead tip over the right atrium and right ventricle. Epicardial pacing wires noted. Again seen are sternotomy wires and cardiac [name] replacement. No cardiomegaly. Mediastinal contours are within normal limits. No focal consolidation or pleural effusion. There is mild central pulmonary vascular dilatation seen. 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 There has been interval left shoulder prosthesis with apparent minimal widening at the prosthetic glenohumeral joint. IMPRESSION: Mild central pulmonary vascular congestion. No pleural effusion or consolidation. There has been interval left shoulder prosthesis with apparent minimal widening at the prosthetic glenohumeral joint. Clinical correlation is suggested. Electronically Signed   By: Frazier Richards M.D.   On: 11/08/2021 09:47

## 2021-11-11 ENCOUNTER — Encounter (HOSPITAL_COMMUNITY): Payer: Self-pay | Admitting: Gastroenterology

## 2021-11-11 DIAGNOSIS — K922 Gastrointestinal hemorrhage, unspecified: Secondary | ICD-10-CM | POA: Diagnosis not present

## 2021-11-11 LAB — CBC WITH DIFFERENTIAL/PLATELET
Abs Immature Granulocytes: 0.04 10*3/uL (ref 0.00–0.07)
Basophils Absolute: 0 10*3/uL (ref 0.0–0.1)
Basophils Relative: 0 %
Eosinophils Absolute: 0.3 10*3/uL (ref 0.0–0.5)
Eosinophils Relative: 4 %
HCT: 24.3 % — ABNORMAL LOW (ref 36.0–46.0)
Hemoglobin: 8.1 g/dL — ABNORMAL LOW (ref 12.0–15.0)
Immature Granulocytes: 1 %
Lymphocytes Relative: 9 %
Lymphs Abs: 0.8 10*3/uL (ref 0.7–4.0)
MCH: 28.8 pg (ref 26.0–34.0)
MCHC: 33.3 g/dL (ref 30.0–36.0)
MCV: 86.5 fL (ref 80.0–100.0)
Monocytes Absolute: 0.6 10*3/uL (ref 0.1–1.0)
Monocytes Relative: 7 %
Neutro Abs: 6.5 10*3/uL (ref 1.7–7.7)
Neutrophils Relative %: 79 %
Platelets: 151 10*3/uL (ref 150–400)
RBC: 2.81 MIL/uL — ABNORMAL LOW (ref 3.87–5.11)
RDW: 17.1 % — ABNORMAL HIGH (ref 11.5–15.5)
WBC: 8.2 10*3/uL (ref 4.0–10.5)
nRBC: 0.4 % — ABNORMAL HIGH (ref 0.0–0.2)

## 2021-11-11 LAB — COMPREHENSIVE METABOLIC PANEL
ALT: 12 U/L (ref 0–44)
AST: 24 U/L (ref 15–41)
Albumin: 2 g/dL — ABNORMAL LOW (ref 3.5–5.0)
Alkaline Phosphatase: 51 U/L (ref 38–126)
Anion gap: 4 — ABNORMAL LOW (ref 5–15)
BUN: 20 mg/dL (ref 8–23)
CO2: 23 mmol/L (ref 22–32)
Calcium: 7.7 mg/dL — ABNORMAL LOW (ref 8.9–10.3)
Chloride: 107 mmol/L (ref 98–111)
Creatinine, Ser: 1.21 mg/dL — ABNORMAL HIGH (ref 0.44–1.00)
GFR, Estimated: 46 mL/min — ABNORMAL LOW (ref >60)
Glucose, Bld: 79 mg/dL (ref 70–99)
Potassium: 4.9 mmol/L (ref 3.5–5.1)
Sodium: 134 mmol/L — ABNORMAL LOW (ref 135–145)
Total Bilirubin: 0.7 mg/dL (ref 0.3–1.2)
Total Protein: 4.1 g/dL — ABNORMAL LOW (ref 6.5–8.1)

## 2021-11-11 LAB — CBC
HCT: 25.2 % — ABNORMAL LOW (ref 36.0–46.0)
Hemoglobin: 8.2 g/dL — ABNORMAL LOW (ref 12.0–15.0)
MCH: 28.6 pg (ref 26.0–34.0)
MCHC: 32.5 g/dL (ref 30.0–36.0)
MCV: 87.8 fL (ref 80.0–100.0)
Platelets: 166 10*3/uL (ref 150–400)
RBC: 2.87 MIL/uL — ABNORMAL LOW (ref 3.87–5.11)
RDW: 17.5 % — ABNORMAL HIGH (ref 11.5–15.5)
WBC: 8.5 10*3/uL (ref 4.0–10.5)
nRBC: 0 % (ref 0.0–0.2)

## 2021-11-11 LAB — PROTIME-INR
INR: 2.5 — ABNORMAL HIGH (ref 0.8–1.2)
Prothrombin Time: 26.5 seconds — ABNORMAL HIGH (ref 11.4–15.2)

## 2021-11-11 LAB — MAGNESIUM: Magnesium: 2.1 mg/dL (ref 1.7–2.4)

## 2021-11-11 MED ORDER — POLYSACCHARIDE IRON COMPLEX 150 MG PO CAPS
150.0000 mg | ORAL_CAPSULE | Freq: Every day | ORAL | Status: DC
  Administered 2021-11-12: 150 mg via ORAL
  Filled 2021-11-11: qty 1

## 2021-11-11 MED ORDER — WARFARIN SODIUM 2.5 MG PO TABS
2.5000 mg | ORAL_TABLET | Freq: Once | ORAL | Status: AC
Start: 2021-11-11 — End: 2021-11-11
  Administered 2021-11-11: 2.5 mg via ORAL
  Filled 2021-11-11: qty 1

## 2021-11-11 NOTE — Progress Notes (Addendum)
Progress Note  Patient Name: [name] Date of Encounter: 11/11/2021  [facility] Cardiologist: Quay Burow, MD   Subjective   Feeling well this morning.   Inpatient Medications    Scheduled Meds:  levothyroxine  50 mcg Oral QAC breakfast   metoprolol succinate  12.5 mg Oral Daily   pantoprazole  20 mg Oral BID AC   pravastatin  40 mg Oral QHS   Warfarin - Pharmacist Dosing Inpatient   Does not apply q1600   Continuous Infusions:  PRN Meds: acetaminophen **OR** acetaminophen, ALPRAZolam, hydrALAZINE, ondansetron **OR** ondansetron (ZOFRAN) IV, polyethylene glycol   Vital Signs    Vitals:   11/10/21 1539 11/10/21 2106 11/10/21 2108 11/11/21 0535  BP: 128/61  (!) 145/48 (!) 144/50  Pulse: 70   70  Resp: 18 19  14   Temp: 98.5 F (36.9 C) 98.7 F (37.1 C)  98.1 F (36.7 C)  TempSrc: Oral Oral  Oral  SpO2: 98% 98%  99%  Weight:      Height:       No intake or output data in the 24 hours ending 11/11/21 0938    11/08/2021    4:38 PM 11/07/2021    9:22 AM 10/09/2021    9:20 AM  Last 3 Weights  Weight (lbs) 100 lb 4.8 oz 98 lb 3.2 oz 103 lb 12.8 oz  Weight (kg) 45.496 kg 44.543 kg 47.083 kg      Telemetry    SR, Vpaced - Personally Reviewed  ECG    No new tracing this morning.  Physical Exam   GEN: No acute distress.   Neck: No JVD Cardiac: RRR, + mechanical valve click, no rubs, or gallops.  Respiratory: Clear to auscultation bilaterally. GI: Soft, nontender, non-distended  MS: No edema; No deformity. Neuro:  Nonfocal  Psych: Normal affect   Labs    High Sensitivity Troponin:   Recent Labs  Lab 11/08/21 0848  TROPONINIHS 11     Chemistry Recent Labs  Lab 11/09/21 0330 11/10/21 0731 11/11/21 0054  NA 136 134* 134*  K 4.1 4.5 4.9  CL 106 104 107  CO2 24 24 23   GLUCOSE 90 117* 79  BUN 42* 31* 20  CREATININE 1.43* 1.28* 1.21*  CALCIUM 7.2* 7.6* 7.7*  MG 1.3* 2.2 2.1  PROT 3.9* 4.0* 4.1*  ALBUMIN 1.9* 2.0* 2.0*  AST 20  24 24   ALT 12 11 12   ALKPHOS 39 48 51  BILITOT 0.9 0.5 0.7  GFRNONAA 37* 43* 46*  ANIONGAP 6 6 4*    Lipids No results for input(s): "CHOL", "TRIG", "HDL", "LABVLDL", "LDLCALC", "CHOLHDL" in the last 168 hours.  Hematology Recent Labs  Lab 11/10/21 0043 11/10/21 0731 11/11/21 0054  WBC 9.3 11.8* 8.2  RBC 2.99* 3.08* 2.81*  HGB 8.4* 8.8* 8.1*  HCT 25.0* 26.1* 24.3*  MCV 83.6 84.7 86.5  MCH 28.1 28.6 28.8  MCHC 33.6 33.7 33.3  RDW 16.3* 16.7* 17.1*  PLT 128* 142* 151   Thyroid No results for input(s): "TSH", "FREET4" in the last 168 hours.  BNPNo results for input(s): "BNP", "PROBNP" in the last 168 hours.  DDimer No results for input(s): "DDIMER" in the last 168 hours.   Radiology    No results found.  Cardiac Studies   N/a   Patient Profile     81 y.o. 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 Candiss Norse.  Assessment & Plan    Acute GIB: presented with 4-5 days of dark stools. Hgb 4.6 on admission s/p 5 units PRBs. Underwent EGD with 4 upper GI nonbleeding AVMs felt to be source of bleeding. H/H improved around 8. Cleared to resume coumadin on 6/11 per GI  H/o mechanical St. [name] Mitral valve replacement in 2007 on coumadin: NR 10 and 2.5 units Vitamin K given. Cleared to resume coumadin 6/11, INR 2.5 today. -- Goal INR 2.5-3.5  Biv heart failure/PPM/defibrillator: Echo 10/30/21 showed LVEF 30-35%, normal RVSF, normal functioning mitral valve,mild AI. -- PTA lasix 102m daily, toprol 250mdaily, spironolactone 2569maily -- PPM/defib followed by Dr. CroRecardo Evangelist OP   CKD stage III with AKI: Scr 1.70 on admission, improved to 1.21   Paroxsymal afib:  -- now resumed on coumadin -- Afib burden  <0.1% on most recent interrogation  For questions or updates, please contact [facility] Please consult [email or web address] for contact info under        Signed, LinReino BellisP  11/11/2021, 9:38 AM     Agree with note by LinReino Bellis-C  Patient admitted with GI bleed and a supratherapeutic INR of 10.  She has a mechanical mitral valve.  She required transfusion of 6 units of packed red blood cells for hemoglobin in the 4 range.  She had AVMs on endoscopy.  Her INR was reversed with vitamin K.  It is currently 2.5.  Hemoglobin is stable.  Anticipate discharge within the next 24 to 48 hours.  JonLorretta Harp.D., FACEffortACGarfield County Public HospitalAHLaverta BaltimoreCTonalea[address]uiNorth MiddletownC  [phone]/2023 12:21 PM

## 2021-11-11 NOTE — Progress Notes (Signed)
PROGRESS NOTE                                                                                                                                                                                                             Patient Demographics:    [name], is a 81 y.o. female, DOB - [birth year], EBR:[id number]  Outpatient Primary MD for the patient is [name], MD    LOS - 3  Admit date - 11/08/2021    Chief Complaint  Patient presents with   Rectal Bleeding       Brief Narrative (HPI from H&P)   81 y.o. 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  In the ED ER work-up suggestive of ongoing upper GI bleed causing severe blood loss related anemia with hemoglobin of 4.6.  GI, cardiology were consulted and I was requested to admit the patient.   Subjective:   Patient in bed, appears comfortable, denies any headache, no fever, no chest pain or pressure, no shortness of breath , no abdominal pain. No new focal weakness.   Assessment  & Plan :    Acute ongoing upper GI bleed with blood loss related anemia.  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  Continue to  monitor CBCs a for another 24 hours if remains overall stable the likely discharge on 11/12/2021.  2.  History of Saint [name] mitral valve replacement in 2002 on Coumadin.  INR at the time of admission was over 10 has been reversed as above.  Cardiology following.  Coumadin was started on 11/10/2021.  3.  History of chronic combined systolic and diastolic CHF.  EF has now improved to 45-50% with biventricular pacemaker/defibrillator.  Currently compensated.  As needed Lasix for now, blood pressure soft hold blood pressure medications, currently no ACE/ARB/Entresto due to AKI.    4.  AKI.  Due to #1 above.  Transfuse and monitor.  Hold nephrotoxins.  Renal function is improving.  5.  Dyslipidemia.  On home dose statin.  6.  Hypothyroidism.  Continue home dose Synthroid.  7.  Hypomagnesemia.  Replaced.      Condition - Extremely Guarded  Family Communication  : Husband bedside on the day of admission on 11/08/2021  Code Status : Full code  Consults  : GI, cardiology  PUD Prophylaxis : IV PPI   Procedures  :      EGD -  Impression:               - No gross lesions in esophagus. Z-line irregular,                            38 cm from the incisors.                           - A single non-bleeding angiodysplastic lesion in                            the stomach. Treated with argon plasma coagulation                            (APC). Clip (MR conditional) was placed.                           - No other gross lesions in the stomach.                           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 Treated with argon plasma coagulation                            (APC).                           - Normal mucosa was found in the entire examined                            duodenum otherwise.                           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 Treated with argon plasma coagulation                             (  APC).                           - Normal mucosa was found in the examined proximal                            jejunum. Tattooed distal extent of today's SBE. Recommendation:           - The patient will be observed post-procedure,                            until all discharge criteria are met.                           - Return patient to hospital ward for ongoing care.                           - Full liquid diet.                           - Transition back to PPI twice daily (home dose).                            Stop IV PPI.                           - IV Iron is recommended (have placed Pharmacy                            consult to aid in administering dose), since she                            has so many antibodies making transfusions more                            difficult.                           - Trend Hgb/Hct and BUN and INR into tomorrow. If                            patient has transfusion dependent anemia develop                            into Sunday, then we will likely recommend                            Colonoscopy + VCE on Monday.                           - Would hold on anticoagulation into tomorrow                            (Coumadin) if possible but if deemed necessary can  give IV heparin drip and monitor.                           - The findings and recommendations were discussed                            with the patient.                           - The findings and recommendations were discussed                            with the referring physician.      Disposition Plan  :    Status is: Inpatient  DVT Prophylaxis  :    SCDs Start: 11/08/21 1045    Lab Results  Component Value Date   PLT 151 11/11/2021    Diet :  Diet Order             Diet full liquid Room service appropriate? Yes; Fluid consistency: Thin  Diet effective now                    Inpatient  Medications  Scheduled Meds:  levothyroxine  50 mcg Oral QAC breakfast   metoprolol succinate  12.5 mg Oral Daily   pantoprazole  20 mg Oral BID AC   pravastatin  40 mg Oral QHS   Warfarin - Pharmacist Dosing Inpatient   Does not apply q1600   Continuous Infusions:   PRN Meds:.acetaminophen **OR** acetaminophen, ALPRAZolam, hydrALAZINE, ondansetron **OR** ondansetron (ZOFRAN) IV, polyethylene glycol  Antibiotics  :    Anti-infectives (From admission, onward)    None        Time Spent in minutes  30   Lala Lund M.D on 11/11/2021 at 8:15 AM  To page go to [email or web address]   [facility] -  Office  [phone]  See all Orders from today for further details    Objective:   Vitals:   11/10/21 1539 11/10/21 2106 11/10/21 2108 11/11/21 0535  BP: 128/61  (!) 145/48 (!) 144/50  Pulse: 70   70  Resp: 18 19  14   Temp: 98.5 F (36.9 C) 98.7 F (37.1 C)  98.1 F (36.7 C)  TempSrc: Oral Oral  Oral  SpO2: 98% 98%  99%  Weight:      Height:        Wt Readings from Last 3 Encounters:  11/08/21 45.5 kg  11/07/21 44.5 kg  10/09/21 47.1 kg    No intake or output data in the 24 hours ending 11/11/21 0815    Physical Exam  Awake Alert, No new F.N deficits, Normal affect [place].AT,PERRAL Supple Neck, No JVD,   Symmetrical Chest wall movement, Good air movement bilaterally, CTAB RRR,No Gallops, Rubs or new Murmurs,  +ve B.Sounds, Abd Soft, No tenderness,   No Cyanosis, Clubbing or edema    Data Review:    CBC Recent Labs  Lab 11/09/21 0330 11/09/21 1630 11/09/21 1822 11/10/21 0043 11/10/21 0731 11/11/21 0054  WBC 9.2 10.9* 10.4 9.3 11.8* 8.2  HGB 6.8* 9.5* 9.5* 8.4* 8.8* 8.1*  HCT 19.4* 27.8* 28.4* 25.0* 26.1* 24.3*  PLT 144* 127* 135* 128* 142* 151  MCV 84.7 83.5 85.3 83.6 84.7 86.5  MCH 29.7 28.5 28.5 28.1 28.6 28.8  MCHC 35.1 34.2  33.5 33.6 33.7 33.3  RDW 16.4* 16.1* 16.3* 16.3* 16.7* 17.1*  LYMPHSABS 1.0  --   --   --  0.5* 0.8  MONOABS 0.8   --   --   --  0.8 0.6  EOSABS 0.3  --   --   --  0.3 0.3  BASOSABS 0.0  --   --   --  0.0 0.0    Electrolytes Recent Labs  Lab 11/08/21 0716 11/08/21 0955 11/08/21 2025 11/09/21 0330 11/09/21 1630 11/10/21 0731 11/11/21 0054  NA 134*  --   --  136  --  134* 134*  K 3.8  --   --  4.1  --  4.5 4.9  CL 101  --   --  106  --  104 107  CO2 22  --   --  24  --  24 23  GLUCOSE 148*  --   --  90  --  117* 79  BUN 61*  --   --  42*  --  31* 20  CREATININE 1.70*  --   --  1.43*  --  1.28* 1.21*  CALCIUM 8.1*  --   --  7.2*  --  7.6* 7.7*  AST 23  --   --  20  --  24 24  ALT 16  --   --  12  --  11 12  ALKPHOS 47  --   --  39  --  48 51  BILITOT 0.5  --   --  0.9  --  0.5 0.7  ALBUMIN 2.5*  --   --  1.9*  --  2.0* 2.0*  MG  --   --   --  1.3*  --  2.2 2.1  INR  --    < > 2.5* 1.9* 1.8* 2.2* 2.5*   < > = values in this interval not displayed.    Radiology Reports DG Chest Portable 1 View  Result Date: 11/08/2021 CLINICAL DATA:  SOB anemia EXAM: PORTABLE CHEST 1 VIEW COMPARISON:  Oct 20, 2019 FINDINGS: As before, there are left subclavian cardiac pacers with lead tip over the right atrium and right ventricle. Epicardial pacing wires noted. Again seen are sternotomy wires and cardiac valve replacement. No cardiomegaly. Mediastinal contours are within normal limits. No focal consolidation or pleural effusion. There is mild central pulmonary vascular dilatation seen. 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 There has been interval left shoulder prosthesis with apparent minimal widening at the prosthetic glenohumeral joint. IMPRESSION: Mild central pulmonary vascular congestion. No pleural effusion or consolidation. There has been interval left shoulder prosthesis with apparent minimal widening at the prosthetic glenohumeral joint. Clinical correlation is suggested. Electronically Signed   By: Frazier Richards M.D.   On: 11/08/2021 09:47

## 2021-11-11 NOTE — Care Management Important Message (Signed)
Important Message  Patient Details  Name: [name] MRN: [id number] Date of Birth: 1941-01-28   Medicare Important Message Given:  Yes     Shelda Altes 11/11/2021, 10:15 AM

## 2021-11-11 NOTE — Progress Notes (Signed)
[place] for Warfarin Indication:  Hx St [name] mitral valve replacement  Allergies  Allergen Reactions   Ace Inhibitors Cough   Lipitor [Atorvastatin Calcium] Other (See Comments)    Knee pain    Megace [Megestrol] Other (See Comments)    High blood pressure and blurred vision   Aspirin Other (See Comments)    Nephrologist said to not take this   Clorazepate Dipotassium Other (See Comments)    Interacts with a drug being taken   Simvastatin Other (See Comments)    Muscle pain   Tiotropium Bromide Monohydrate Other (See Comments)    Dry mouth   Codeine Nausea And Vomiting   Tape Rash    Patient Measurements: Height: 5' 2"  (157.5 cm) Weight: 45.5 kg (100 lb 4.8 oz) IBW/kg (Calculated) : 50.1  Vital Signs: Temp: 98.1 F (36.7 C) (06/12 0535) Temp Source: Oral (06/12 0535) BP: 144/50 (06/12 0535) Pulse Rate: 70 (06/12 0535)  Labs: Recent Labs    11/08/21 0955 11/08/21 2025 11/09/21 0330 11/09/21 1630 11/09/21 1822 11/10/21 0043 11/10/21 0731 11/11/21 0054  HGB  --    < > 6.8* 9.5*   < > 8.4* 8.8* 8.1*  HCT  --    < > 19.4* 27.8*   < > 25.0* 26.1* 24.3*  PLT  --    < > 144* 127*   < > 128* 142* 151  APTT 66*  --   --   --   --   --   --   --   LABPROT >90.0*   < > 22.0* 20.3*  --   --  24.0* 26.5*  INR >10.0*   < > 1.9* 1.8*  --   --  2.2* 2.5*  CREATININE  --   --  1.43*  --   --   --  1.28* 1.21*   < > = values in this interval not displayed.     Estimated Creatinine Clearance: 27.1 mL/min (A) (by C-G formula based on SCr of 1.21 mg/dL (H)).   Medical History: Past Medical History:  Diagnosis Date   Allergy    seasonal   Anemia    Anxiety    Arthritis    Asthma    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 Protecta   Biventricular ICD (implantable cardioverter-defibrillator) in place    with CRT   Blood transfusion without reported diagnosis    Bursitis    Cataract    RIGHT EYE    CHF (congestive heart failure) ([place])    Chronic kidney disease    Colon polyp    adenomatous   Complication of anesthesia    patient stated that had difficulty getting the breathing tube removed, patient said that she stopped breathing and HR dropped to 10  patient then woke up and started breathing pateint stated no longer than one minute; re-intubated in PACU following cholecystectomy 10/28/13   COPD (chronic obstructive pulmonary disease) ([place])    Depression    Diverticulosis    Dysrhythmia    Fibromyalgia    GERD (gastroesophageal reflux disease)    Gout    H/O mitral valve replacement 2002, 2007   Heart murmur    Hemorrhoids    Hyperlipidemia    Hypertension    Hypothyroidism    IBS (irritable bowel syndrome)    MVP (mitral valve prolapse)    Neuromuscular disorder (HCC)    fibromyalgia   Pacemaker    Peptic ulcer  disease     Medications:  Scheduled:   levothyroxine  50 mcg Oral QAC breakfast   metoprolol succinate  12.5 mg Oral Daily   pantoprazole  20 mg Oral BID AC   pravastatin  40 mg Oral QHS   Warfarin - Pharmacist Dosing Inpatient   Does not apply q1600    Assessment: 81 yo F presenting with acute ongoing upper GI bleed with blood loss related anemia. Patient is on Warfarin PTA for hx St [name] mitral valve replacement. Last PTA Warfarin dose was on 6/8 at ~1830.   INR on admission was supratherapeutic at > 10, and reversed with IV vitamin K on 6/9. 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 Warfarin resumed on 6/11. INR today is therapeutic at 2.5, Hg= 8.1  PTA Warfarin Regimen: 2.5 mg on MWF, and 5 mg on all other days  Goal of Therapy:  INR 2.5-3.5 Monitor platelets by anticoagulation protocol: Yes   Plan:  Warfarin 2.5 mg PO x 1 tonight.  Daily INR.  Hildred Laser, PharmD Clinical Pharmacist **Pharmacist phone directory can now be found on [email or web address] ([email or web address]).  Listed under [place].

## 2021-11-11 NOTE — Progress Notes (Signed)
Mobility Specialist Progress Note    11/11/21 1558  Mobility  Activity Ambulated independently in hallway  Level of Assistance Modified independent, requires aide device or extra time  Assistive Device Front wheel walker  Distance Ambulated (ft) 470 ft  Activity Response Tolerated well  $Mobility charge 1 Mobility   Post-Mobility: 72 HR  Pt received in bed and agreeable. No complaints on walk. Returned to bed with call bell in reach.    Hildred Alamin Mobility Specialist

## 2021-11-12 LAB — COMPREHENSIVE METABOLIC PANEL
ALT: 11 U/L (ref 0–44)
AST: 25 U/L (ref 15–41)
Albumin: 2.1 g/dL — ABNORMAL LOW (ref 3.5–5.0)
Alkaline Phosphatase: 53 U/L (ref 38–126)
Anion gap: 7 (ref 5–15)
BUN: 12 mg/dL (ref 8–23)
CO2: 22 mmol/L (ref 22–32)
Calcium: 7.8 mg/dL — ABNORMAL LOW (ref 8.9–10.3)
Chloride: 104 mmol/L (ref 98–111)
Creatinine, Ser: 1.17 mg/dL — ABNORMAL HIGH (ref 0.44–1.00)
GFR, Estimated: 47 mL/min — ABNORMAL LOW (ref >60)
Glucose, Bld: 86 mg/dL (ref 70–99)
Potassium: 4.7 mmol/L (ref 3.5–5.1)
Sodium: 133 mmol/L — ABNORMAL LOW (ref 135–145)
Total Bilirubin: 0.9 mg/dL (ref 0.3–1.2)
Total Protein: 4.3 g/dL — ABNORMAL LOW (ref 6.5–8.1)

## 2021-11-12 LAB — CBC WITH DIFFERENTIAL/PLATELET
Abs Immature Granulocytes: 0.04 10*3/uL (ref 0.00–0.07)
Basophils Absolute: 0 10*3/uL (ref 0.0–0.1)
Basophils Relative: 0 %
Eosinophils Absolute: 0.3 10*3/uL (ref 0.0–0.5)
Eosinophils Relative: 4 %
HCT: 25.7 % — ABNORMAL LOW (ref 36.0–46.0)
Hemoglobin: 8.2 g/dL — ABNORMAL LOW (ref 12.0–15.0)
Immature Granulocytes: 1 %
Lymphocytes Relative: 9 %
Lymphs Abs: 0.6 10*3/uL — ABNORMAL LOW (ref 0.7–4.0)
MCH: 29.1 pg (ref 26.0–34.0)
MCHC: 31.9 g/dL (ref 30.0–36.0)
MCV: 91.1 fL (ref 80.0–100.0)
Monocytes Absolute: 0.4 10*3/uL (ref 0.1–1.0)
Monocytes Relative: 5 %
Neutro Abs: 5.9 10*3/uL (ref 1.7–7.7)
Neutrophils Relative %: 81 %
Platelets: 179 10*3/uL (ref 150–400)
RBC: 2.82 MIL/uL — ABNORMAL LOW (ref 3.87–5.11)
RDW: 17.9 % — ABNORMAL HIGH (ref 11.5–15.5)
WBC: 7.2 10*3/uL (ref 4.0–10.5)
nRBC: 0 % (ref 0.0–0.2)

## 2021-11-12 LAB — MAGNESIUM: Magnesium: 1.9 mg/dL (ref 1.7–2.4)

## 2021-11-12 LAB — PROTIME-INR
INR: 3.1 — ABNORMAL HIGH (ref 0.8–1.2)
Prothrombin Time: 31.8 seconds — ABNORMAL HIGH (ref 11.4–15.2)

## 2021-11-12 MED ORDER — WARFARIN SODIUM 2.5 MG PO TABS: 2.5000 mg | ORAL_TABLET | ORAL | Status: DC

## 2021-11-12 MED ORDER — WARFARIN SODIUM 2.5 MG PO TABS: 2.5000 mg | ORAL_TABLET | Freq: Once | ORAL | Status: DC

## 2021-11-12 MED ORDER — POLYSACCHARIDE IRON COMPLEX 150 MG PO CAPS: 150.0000 mg | ORAL_CAPSULE | Freq: Every day | ORAL | 0 refills | Status: DC

## 2021-11-12 NOTE — Progress Notes (Signed)
Discharge instructions provided. All medications, follow up appointments, and discharge instructions provided. IV out. Monitor off CCMD notified. Discharging to home with family.  Era Bumpers, RN

## 2021-11-12 NOTE — Progress Notes (Signed)
[name] for Warfarin Indication:  Hx St [name] mitral valve replacement  Allergies  Allergen Reactions   Ace Inhibitors Cough   Lipitor [Atorvastatin Calcium] Other (See Comments)    Knee pain    Megace [Megestrol] Other (See Comments)    High blood pressure and blurred vision   Aspirin Other (See Comments)    Nephrologist said to not take this   Clorazepate Dipotassium Other (See Comments)    Interacts with a drug being taken   Simvastatin Other (See Comments)    Muscle pain   Tiotropium Bromide Monohydrate Other (See Comments)    Dry mouth   Codeine Nausea And Vomiting   Tape Rash    Patient Measurements: Height: 5' 2"  (157.5 cm) Weight: 45.5 kg (100 lb 4.8 oz) IBW/kg (Calculated) : 50.1  Vital Signs: Temp: 98.3 F (36.8 C) (06/13 0624) Temp Source: Oral (06/13 0624) BP: 130/59 (06/13 0624) Pulse Rate: 70 (06/13 0624)  Labs: Recent Labs    11/10/21 0731 11/11/21 0054 11/11/21 1110 11/12/21 0224  HGB 8.8* 8.1* 8.2* 8.2*  HCT 26.1* 24.3* 25.2* 25.7*  PLT 142* 151 166 179  LABPROT 24.0* 26.5*  --  31.8*  INR 2.2* 2.5*  --  3.1*  CREATININE 1.28* 1.21*  --  1.17*     Estimated Creatinine Clearance: 28 mL/min (A) (by C-G formula based on SCr of 1.17 mg/dL (H)).   Medical History: Past Medical History:  Diagnosis Date   Allergy    seasonal   Anemia    Anxiety    Arthritis    Asthma    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 Protecta   Biventricular ICD (implantable cardioverter-defibrillator) in place    with CRT   Blood transfusion without reported diagnosis    Bursitis    Cataract    RIGHT EYE   CHF (congestive heart failure) ([place])    Chronic kidney disease    Colon polyp    adenomatous   Complication of anesthesia    patient stated that had difficulty getting the breathing tube removed, patient said that she stopped breathing and HR dropped to 10  patient then woke up and  started breathing pateint stated no longer than one minute; re-intubated in PACU following cholecystectomy 10/28/13   COPD (chronic obstructive pulmonary disease) ([place])    Depression    Diverticulosis    Dysrhythmia    Fibromyalgia    GERD (gastroesophageal reflux disease)    Gout    H/O mitral valve replacement 2002, 2007   Heart murmur    Hemorrhoids    Hyperlipidemia    Hypertension    Hypothyroidism    IBS (irritable bowel syndrome)    MVP (mitral valve prolapse)    Neuromuscular disorder (HCC)    fibromyalgia   Pacemaker    Peptic ulcer disease     Medications:  Scheduled:   iron polysaccharides  150 mg Oral Daily   levothyroxine  50 mcg Oral QAC breakfast   metoprolol succinate  12.5 mg Oral Daily   pantoprazole  20 mg Oral BID AC   pravastatin  40 mg Oral QHS   Warfarin - Pharmacist Dosing Inpatient   Does not apply q1600    Assessment: 81 yo F presenting with acute ongoing upper GI bleed with blood loss related anemia. Patient is on Warfarin PTA for hx St [name] mitral valve replacement. Last PTA Warfarin dose was on 6/8 at ~1830.   INR on  admission was supratherapeutic at > 10, and reversed with IV vitamin K on 6/9. 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 Warfarin resumed on 6/11. INR today is therapeutic at 3.1  PTA Warfarin Regimen: 2.5 mg on MWF, and 5 mg on all other days  Goal of Therapy:  INR 2.5-3.5 Monitor platelets by anticoagulation protocol: Yes   Plan:  -Warfarin 5 mg PO x 1 tonight.  -Daily INR. -When ready to discharge would send home on her home dose ( 2.5 mg on MWF, and 5 mg on all other days)  Hildred Laser, PharmD Clinical Pharmacist **Pharmacist phone directory can now be found on [email or web address] ([email or web address]).  Listed under [place].

## 2021-11-12 NOTE — Progress Notes (Signed)
Physical Therapy Treatment Patient Details Name: [name] MRN: [id number] DOB: 01-23-1941 Today's Date: 11/12/2021   History of Present Illness Pt is a 82 y.o. femalea dmitted 11/08/21 with GIB, severe blood loss anemia (hgb 4.6). 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    PT Comments    Pt received OOB on arrival, eager and motivated for session, with great progress towards goals and focus of session on safe stair negotiation for safe d/c to home. Pt able to demonstrate ambulation in hall with RW with min guard for safety with no LOB; pt with good self activity pacing, with ambulation distance limited to pt stated tolerance. Pt able to ascend/descend 5 steps in stair well with step to pattern sideways with BUE on R rail without LOB with light min assist for cues for step to pattern for safety. Pt continues to benefit from skilled PT services to progress toward functional mobility goals.   HR 89 at rest HR 141mx during ambulation   Recommendations for follow up therapy are one component of a multi-disciplinary discharge planning process, led by the attending physician.  Recommendations may be updated based on patient status, additional functional criteria and insurance authorization.  Follow Up Recommendations  No PT follow up     Assistance Recommended at Discharge Intermittent Supervision/Assistance  Patient can return home with the following A little help with bathing/dressing/bathroom;Assistance with cooking/housework;Assist for transportation   Equipment Recommendations  None recommended by PT    Recommendations for Other Services       Precautions / Restrictions Precautions Precautions: Fall Restrictions Weight Bearing Restrictions: No     Mobility  Bed Mobility Overal bed mobility: Modified Independent             General bed mobility comments: pt OOB on arrival    Transfers Overall transfer level: Needs  assistance Equipment used: Rolling walker (2 wheels) Transfers: Sit to/from Stand Sit to Stand: Supervision                Ambulation/Gait Ambulation/Gait assistance: Min guard Gait Distance (Feet): 250 Feet Assistive device: Rolling walker (2 wheels) Gait Pattern/deviations: Step-through pattern, Decreased stride length Gait velocity: Decreased     General Gait Details: slow, steady gait with RW and min guard for balance; pt with good activity pacing, requesting return to room due to fatigue HR max 101   Stairs Stairs: Yes Stairs assistance: Min assist Stair Management: One rail Right, Step to pattern, Sideways Number of Stairs: 5 General stair comments: good stability, light cues for step to gait and for body position facing rail for safety, no LOB   Wheelchair Mobility    Modified Rankin (Stroke Patients Only)       Balance Overall balance assessment: Needs assistance Sitting-balance support: No upper extremity supported Sitting balance-Leahy Scale: Good       Standing balance-Leahy Scale: Fair Standing balance comment: can static stand without UE support, preference for RW                            Cognition Arousal/Alertness: Awake/alert Behavior During Therapy: WFL for tasks assessed/performed Overall Cognitive Status: Within Functional Limits for tasks assessed                                          Exercises Other Exercises Other  Exercises: 5x STS without UE support    General Comments General comments (skin integrity, edema, etc.): HR resting 89, HR max during ambulation 101      Pertinent Vitals/Pain      Home Living                          Prior Function            PT Goals (current goals can now be found in the care plan section) Acute Rehab PT Goals Patient Stated Goal: return home PT Goal Formulation: With patient Time For Goal Achievement: 11/23/21    Frequency    Min  3X/week      PT Plan      Co-evaluation              AM-PAC PT "6 Clicks" Mobility   Outcome Measure  Help needed turning from your back to your side while in a flat bed without using bedrails?: None Help needed moving from lying on your back to sitting on the side of a flat bed without using bedrails?: None Help needed moving to and from a bed to a chair (including a wheelchair)?: A Little Help needed standing up from a chair using your arms (e.g., wheelchair or bedside chair)?: A Little Help needed to walk in hospital room?: A Little Help needed climbing 3-5 steps with a railing? : A Little 6 Click Score: 20    End of Session   Activity Tolerance: Patient tolerated treatment well Patient left: in chair;with call bell/phone within reach Nurse Communication: Mobility status PT Visit Diagnosis: Other abnormalities of gait and mobility (R26.89);Muscle weakness (generalized) (M62.81)     Time: 5374-8270 PT Time Calculation (min) (ACUTE ONLY): 20 min  Charges:  $Gait Training: 8-22 mins                     Audry Riles. PTA Acute Rehabilitation Services Office: [place] 11/12/2021, 9:28 AM

## 2021-11-12 NOTE — Discharge Summary (Signed)
[name] GUY:[id number] DOB: May 19, 1941 DOA: 11/08/2021  PCP: [name], MD  Admit date: 11/08/2021  Discharge date: 11/12/2021  Admitted From: Home   Disposition:  Home   Recommendations for Outpatient Follow-up:   Follow up with PCP in 1-2 weeks  PCP Please obtain BMP/CBC, 2 view CXR in 1week,  (see Discharge instructions)   PCP Please follow up on the following pending results: Monitor CBC, INR, Coumadin dose closely   Home Health: None   Equipment/Devices: None  Consultations: GI, Cards Discharge Condition: Stable    CODE STATUS: Full    Diet Recommendation: Heart Healthy     Chief Complaint  Patient presents with   Rectal Bleeding     Brief history of present illness from the day of admission and additional interim summary     81 y.o. 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  In the ED ER work-up suggestive of ongoing upper GI bleed causing severe blood loss related anemia with hemoglobin of 4.6.  GI, cardiology were consulted and I was requested to admit the patient.                                                                 Hospital Course    Acute ongoing upper GI bleed with blood loss related anemia.  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  She is at her baseline.  Coumadin education provided by pharmacy prior to discharge as well.   2.  History of Saint [name] mitral valve replacement in 2002 on Coumadin.  INR at the time of admission was over 10 has been reversed as above.  In by cardiology, resume home dose Coumadin with close outpatient follow-up with PCP for close Coumadin and INR monitoring.   3.  History of chronic combined systolic and diastolic CHF.  EF has now improved to 45-50% with biventricular pacemaker/defibrillator.  Currently compensated.  Resume home regimen.  4.  AKI.  Due to #1 above.  Transfuse and monitor.  Hold nephrotoxins.  Renal function is improving & close to her baseline.   5.  Dyslipidemia.  On home dose statin.   6.  Hypothyroidism.  Continue home dose Synthroid.   Discharge diagnosis     Principal Problem:   Acute upper GI bleeding    Discharge instructions    Discharge Instructions     Diet - low sodium heart healthy   Complete by: As directed    Discharge instructions   Complete by: As directed    Follow with Primary MD [name], MD in 7 days   Get CBC, CMP, INR -  checked next visit within 2 days by Primary MD    Activity: As tolerated with Full fall precautions use walker/cane & assistance as needed  Disposition Home   Diet: Heart Healthy   Special Instructions: If you have smoked or chewed Tobacco  in the last 2 yrs please stop smoking, stop any regular Alcohol  and or any Recreational drug use.  On your next visit with your primary care physician please Get Medicines reviewed and adjusted.  Please request your  Prim.MD to go over all Hospital Tests and Procedure/Radiological results at the follow up, please get all Hospital records sent to your Prim MD by signing hospital release before you go home.  If you experience worsening of your admission symptoms, develop shortness of breath, life threatening emergency, suicidal or homicidal thoughts you must seek medical attention immediately by calling 911 or calling your MD immediately  if symptoms less severe.  You Must read complete instructions/literature along with all the possible adverse reactions/side effects for all the Medicines you take and that have been prescribed to you. Take any new Medicines after you have completely understood and accpet all the possible adverse reactions/side effects.   Increase activity slowly   Complete by: As directed        Discharge Medications   Allergies as of 11/12/2021       Reactions   Ace Inhibitors Cough   Lipitor [atorvastatin Calcium] Other (See Comments)   Knee pain   Megace [megestrol] Other (See Comments)   High blood pressure and blurred vision   Aspirin Other (See Comments)   Nephrologist said to not take this   Clorazepate Dipotassium Other (See Comments)   Interacts with a drug being taken   Simvastatin Other (See Comments)   Muscle pain   Tiotropium Bromide Monohydrate Other (See Comments)   Dry mouth   Codeine Nausea And Vomiting   Tape Rash        Medication List     TAKE these medications    acetaminophen 650 MG CR tablet Commonly known as: TYLENOL Take 650 mg by mouth every 8 (eight) hours as needed for pain.   allopurinol 300 MG tablet Commonly known as: ZYLOPRIM Take 300 mg by mouth every evening.   ALPRAZolam 0.25 MG tablet Commonly known as: XANAX Take 0.25 mg by mouth at bedtime as needed for anxiety or sleep.   antiseptic oral rinse Liqd 15 mLs by Mouth Rinse route as needed for dry mouth.   buPROPion 150 MG 12 hr tablet Commonly known as: WELLBUTRIN SR Take  150 mg by mouth in the morning.   esomeprazole 20 MG capsule Commonly known as: NEXIUM Take 1 capsule (20 mg total) by mouth 2 (two) times daily before a meal. What changed: when to take this   fluticasone  50 MCG/ACT nasal spray Commonly known as: FLONASE Place 1 spray into both nostrils as needed for allergies.   furosemide 40 MG tablet Commonly known as: LASIX TAKE 1 TABLET BY MOUTH DAILY. **NEEDS OV FOR FUTURE REFILLS** What changed: See the new instructions.   icosapent Ethyl 1 g capsule Commonly known as: VASCEPA TAKE 2 CAPSULES BY MOUTH 2 TIMES DAILY. What changed: See the new instructions.   iron polysaccharides 150 MG capsule Commonly known as: NIFEREX Take 1 capsule (150 mg total) by mouth daily.   metoCLOPramide 5 MG tablet Commonly known as: REGLAN Take 5 mg by mouth 2 (two) times daily as needed for nausea or vomiting.   metoprolol succinate 25 MG 24 hr tablet Commonly known as: TOPROL-XL Take 25 mg in the morning and 50 mg (two tablets) in the evening   multivitamin with minerals Tabs tablet Take 1 tablet by mouth daily.   phenylephrine 10 MG Tabs tablet Commonly known as: SUDAFED PE Take 10 mg by mouth at bedtime as needed (Congestion).   pravastatin 40 MG tablet Commonly known as: PRAVACHOL Take 40 mg at bedtime by mouth.   ProAir HFA 108 (90 Base) MCG/ACT inhaler Generic drug: albuterol Inhale 1-2 puffs into the lungs every 6 (six) hours as needed for wheezing.   Senna 15 MG Tabs Take 30 mg by mouth at bedtime.   spironolactone 25 MG tablet Commonly known as: ALDACTONE Take 1 tablet (25 mg total) by mouth daily.   Synthroid 50 MCG tablet Generic drug: levothyroxine Take 50 mcg by mouth at bedtime.   Voltaren 1 % Gel Generic drug: diclofenac Sodium Apply 2-4 g topically in the morning and at bedtime.   warfarin 2.5 MG tablet Commonly known as: COUMADIN Take as directed. If you are unsure how to take this medication, talk to your nurse or  doctor. Original instructions: Take 1-2 tablets (2.5-5 mg total) by mouth See admin instructions. Take 5 mg by mouth after 6 PM on Sun/Tues/Wed/Thurs/Sat and 2.5 mg on Mon/Fri What changed:  additional instructions Another medication with the same name was removed. Continue taking this medication, and follow the directions you see here.         Follow-up Information     [facility] Follow up on 11/15/2021.   Specialty: Cardiology Why: at 8:15am for your follow up INR check Contact information: [place] [phone]        Lenna Sciara, NP Follow up on 11/19/2021.   Specialties: Nurse Practitioner, Family Medicine Why: at 8:30am for your follow up appt with Dr. Rachel Bo' NP Maris Berger information: [address] [place] [address] [phone]         [name], MD. Schedule an appointment as soon as possible for a visit in 2 day(s).   Specialty: Internal Medicine Contact information: [place] [address] [phone]         Sanda Klein, MD .   Specialty: Cardiology Contact information: [address] [place] [address] [phone]         Lorretta Harp, MD. Schedule an appointment as soon as possible for a visit in 1 week(s).   Specialties: Cardiology, Radiology Contact information: [address] [place] [address] [phone]         Mansouraty, Telford Nab., MD. Schedule an appointment as soon as possible for a visit in 2 week(s).   Specialties: Gastroenterology, Internal Medicine Contact  information: [place]  [address] [phone]                 Major procedures and Radiology Reports - PLEASE review detailed and final reports thoroughly  -       DG Chest Portable 1 View  Result Date: 11/08/2021 CLINICAL DATA:  SOB anemia EXAM: PORTABLE CHEST 1 VIEW COMPARISON:   Oct 20, 2019 FINDINGS: As before, there are left subclavian cardiac pacers with lead tip over the right atrium and right ventricle. Epicardial pacing wires noted. Again seen are sternotomy wires and cardiac valve replacement. No cardiomegaly. Mediastinal contours are within normal limits. No focal consolidation or pleural effusion. There is mild central pulmonary vascular dilatation seen. 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 There has been interval left shoulder prosthesis with apparent minimal widening at the prosthetic glenohumeral joint. IMPRESSION: Mild central pulmonary vascular congestion. No pleural effusion or consolidation. There has been interval left shoulder prosthesis with apparent minimal widening at the prosthetic glenohumeral joint. Clinical correlation is suggested. Electronically Signed   By: Frazier Richards M.D.   On: 11/08/2021 09:47   ECHOCARDIOGRAM COMPLETE  Result Date: 10/30/2021    ECHOCARDIOGRAM REPORT   Patient Name:   [name] [facility] Date of Exam: 10/30/2021 Medical Rec #:  [id number]           Height:       62.0 in Accession #:    [id number]          Weight:       103.8 lb Date of Birth:  March 20, 1941            BSA:          1.447 m Patient Age:    35 years            BP:           120/60 mmHg Patient Gender: F                   HR:           70 bpm. Exam Location:  [place] Procedure: 2D Echo, Cardiac Doppler and Color Doppler Indications:    Q22.97 Chronic diastolic (congestive) heart failure  History:        Patient has prior history of Echocardiogram examinations, most                 recent 08/16/2020. Defibrillator; Risk Factors:Dyslipidemia and                 Hypertension.                  Mitral Valve: 25 mm St. [name] mechanical valve valve is present                 in the mitral position. Procedure Date: 02/26/2006.  Sonographer:    Mikki Santee RDCS Referring Phys: [facility]  1. Left ventricular ejection fraction, by estimation, is 30 to  35%. The left ventricle has moderately decreased function. The left ventricle demonstrates global hypokinesis. Left ventricular diastolic parameters are indeterminate.  2. Right ventricular systolic function is normal. The right ventricular size is normal. Tricuspid regurgitation signal is inadequate for assessing PA pressure.  3. Left atrial size was mildly dilated.  4. The mitral valve has been repaired/replaced. Trivial mitral valve regurgitation. The mean mitral valve gradient is 3.0 mmHg with average heart rate of 70 bpm. There is a 25 mm  St. [name] mechanical valve present in the mitral position. Procedure Date: 02/26/2006.  5. The aortic valve is tricuspid. Aortic valve regurgitation is mild. Aortic valve sclerosis is present, with no evidence of aortic valve stenosis.  6. The inferior vena cava is normal in size with greater than 50% respiratory variability, suggesting right atrial pressure of 3 mmHg. FINDINGS  Left Ventricle: Left ventricular ejection fraction, by estimation, is 30 to 35%. The left ventricle has moderately decreased function. The left ventricle demonstrates global hypokinesis. The left ventricular internal cavity size was normal in size. There is no left ventricular hypertrophy. Left ventricular diastolic parameters are indeterminate. Right Ventricle: The right ventricular size is normal. No increase in right ventricular wall thickness. Right ventricular systolic function is normal. Tricuspid regurgitation signal is inadequate for assessing PA pressure. Left Atrium: Left atrial size was mildly dilated. Right Atrium: Right atrial size was normal in size. Pericardium: There is no evidence of pericardial effusion. Mitral Valve: The mitral valve has been repaired/replaced. Trivial mitral valve regurgitation. There is a 25 mm St. [name] mechanical valve present in the mitral position. Procedure Date: 02/26/2006. MV peak gradient, 5.6 mmHg. The mean mitral valve gradient is 3.0 mmHg with average heart  rate of 70 bpm. Tricuspid Valve: The tricuspid valve is normal in structure. Tricuspid valve regurgitation is trivial. Aortic Valve: The aortic valve is tricuspid. Aortic valve regurgitation is mild. Aortic valve sclerosis is present, with no evidence of aortic valve stenosis. Pulmonic Valve: The pulmonic valve was not well visualized. Pulmonic valve regurgitation is not visualized. Aorta: The aortic root and ascending aorta are structurally normal, with no evidence of dilitation. Venous: The inferior vena cava is normal in size with greater than 50% respiratory variability, suggesting right atrial pressure of 3 mmHg. IAS/Shunts: The interatrial septum was not well visualized.  LEFT VENTRICLE PLAX 2D LVIDd:         4.30 cm      Diastology LVIDs:         3.60 cm      LV e' medial:    5.51 cm/s LV PW:         0.80 cm      LV E/e' medial:  23.0 LV IVS:        0.70 cm      LV e' lateral:   4.87 cm/s LVOT diam:     2.00 cm      LV E/e' lateral: 26.1 LV SV:         46 LV SV Index:   32 LVOT Area:     3.14 cm  LV Volumes (MOD) LV vol d, MOD A2C: 117.0 ml LV vol d, MOD A4C: 86.7 ml LV vol s, MOD A2C: 71.7 ml LV vol s, MOD A4C: 51.8 ml LV SV MOD A2C:     45.3 ml LV SV MOD A4C:     86.7 ml LV SV MOD BP:      39.7 ml RIGHT VENTRICLE RV Basal diam:  2.40 cm RV S prime:     6.73 cm/s TAPSE (M-mode): 0.8 cm LEFT ATRIUM             Index        RIGHT ATRIUM           Index LA diam:        3.40 cm 2.35 cm/m   RA Area:     10.60 cm LA Vol (A2C):   79.5 ml 54.95 ml/m  RA Volume:  20.40 ml  14.10 ml/m LA Vol (A4C):   40.8 ml 28.20 ml/m LA Biplane Vol: 57.8 ml 39.95 ml/m  AORTIC VALVE LVOT Vmax:   90.70 cm/s LVOT Vmean:  48.800 cm/s LVOT VTI:    0.147 m  AORTA Ao Root diam: 2.80 cm Ao Asc diam:  3.10 cm MITRAL VALVE MV Area (PHT): 3.03 cm     SHUNTS MV Area VTI:   1.52 cm     Systemic VTI:  0.15 m MV Peak grad:  5.6 mmHg     Systemic Diam: 2.00 cm MV Mean grad:  3.0 mmHg MV Vmax:       1.18 m/s MV Vmean:      81.6 cm/s MV  Decel Time: 250 msec MV E velocity: 127.00 cm/s MV A velocity: 121.00 cm/s MV E/A ratio:  1.05 Oswaldo Milian MD Electronically signed by Oswaldo Milian MD Signature Date/Time: 10/30/2021/5:03:32 PM    Final       Today   Subjective    [name] today has no headache,no chest abdominal pain,no new weakness tingling or numbness, feels much better wants to go home today.     Objective   Blood pressure (!) 130/59, pulse 70, temperature 98.3 F (36.8 C), temperature source Oral, resp. rate 16, height 5' 2"  (1.575 m), weight 45.5 kg, SpO2 95 %.   Intake/Output Summary (Last 24 hours) at 11/12/2021 0854 Last data filed at 11/12/2021 0625 Gross per 24 hour  Intake 120 ml  Output --  Net 120 ml    Exam  Awake Alert, No new F.N deficits,    [place].AT,PERRAL Supple Neck,   Symmetrical Chest wall movement, Good air movement bilaterally, CTAB RRR,No Gallops,   +ve B.Sounds, Abd Soft, Non tender,  No Cyanosis, Clubbing or edema    Data Review   Recent Labs  Lab 11/09/21 0330 11/09/21 1630 11/10/21 0043 11/10/21 0731 11/11/21 0054 11/11/21 1110 11/12/21 0224  WBC 9.2   < > 9.3 11.8* 8.2 8.5 7.2  HGB 6.8*   < > 8.4* 8.8* 8.1* 8.2* 8.2*  HCT 19.4*   < > 25.0* 26.1* 24.3* 25.2* 25.7*  PLT 144*   < > 128* 142* 151 166 179  MCV 84.7   < > 83.6 84.7 86.5 87.8 91.1  MCH 29.7   < > 28.1 28.6 28.8 28.6 29.1  MCHC 35.1   < > 33.6 33.7 33.3 32.5 31.9  RDW 16.4*   < > 16.3* 16.7* 17.1* 17.5* 17.9*  LYMPHSABS 1.0  --   --  0.5* 0.8  --  0.6*  MONOABS 0.8  --   --  0.8 0.6  --  0.4  EOSABS 0.3  --   --  0.3 0.3  --  0.3  BASOSABS 0.0  --   --  0.0 0.0  --  0.0   < > = values in this interval not displayed.    Recent Labs  Lab 11/08/21 0716 11/08/21 0955 11/09/21 0330 11/09/21 1630 11/10/21 0731 11/11/21 0054 11/12/21 0224  NA 134*  --  136  --  134* 134* 133*  K 3.8  --  4.1  --  4.5 4.9 4.7  CL 101  --  106  --  104 107 104  CO2 22  --  24  --  24 23 22   GLUCOSE  148*  --  90  --  117* 79 86  BUN 61*  --  42*  --  31* 20 12  CREATININE 1.70*  --  1.43*  --  1.28* 1.21* 1.17*  CALCIUM 8.1*  --  7.2*  --  7.6* 7.7* 7.8*  AST 23  --  20  --  24 24 25   ALT 16  --  12  --  11 12 11   ALKPHOS 47  --  39  --  48 51 53  BILITOT 0.5  --  0.9  --  0.5 0.7 0.9  ALBUMIN 2.5*  --  1.9*  --  2.0* 2.0* 2.1*  MG  --   --  1.3*  --  2.2 2.1 1.9  INR  --    < > 1.9* 1.8* 2.2* 2.5* 3.1*   < > = values in this interval not displayed.    Total Time in preparing paper work, data evaluation and todays exam - 6 minutes  Lala Lund M.D on 11/12/2021 at 8:54 AM  [facility]

## 2021-11-12 NOTE — Progress Notes (Addendum)
Progress Note  Patient Name: [name] Date of Encounter: 11/12/2021  [facility] Cardiologist: Quay Burow, MD   Subjective   No complaints. Hopeful to DC home today.   Inpatient Medications    Scheduled Meds:  iron polysaccharides  150 mg Oral Daily   levothyroxine  50 mcg Oral QAC breakfast   metoprolol succinate  12.5 mg Oral Daily   pantoprazole  20 mg Oral BID AC   pravastatin  40 mg Oral QHS   warfarin  2.5 mg Oral ONCE-1600   Warfarin - Pharmacist Dosing Inpatient   Does not apply q1600   Continuous Infusions:  PRN Meds: acetaminophen **OR** acetaminophen, ALPRAZolam, hydrALAZINE, ondansetron **OR** ondansetron (ZOFRAN) IV, polyethylene glycol   Vital Signs    Vitals:   11/11/21 0937 11/11/21 1501 11/11/21 2130 11/12/21 0624  BP: (!) 133/49 (!) 130/51 (!) 149/77 (!) 130/59  Pulse: 71 70 76 70  Resp:  16 15 16   Temp:  98 F (36.7 C) 97.8 F (36.6 C) 98.3 F (36.8 C)  TempSrc:  Oral Oral Oral  SpO2:  99% 99% 95%  Weight:      Height:        Intake/Output Summary (Last 24 hours) at 11/12/2021 0836 Last data filed at 11/12/2021 1638 Gross per 24 hour  Intake 120 ml  Output --  Net 120 ml      11/08/2021    4:38 PM 11/07/2021    9:22 AM 10/09/2021    9:20 AM  Last 3 Weights  Weight (lbs) 100 lb 4.8 oz 98 lb 3.2 oz 103 lb 12.8 oz  Weight (kg) 45.496 kg 44.543 kg 47.083 kg      Telemetry    SR, Vpaced - Personally Reviewed  ECG    No new tracing this morning  Physical Exam   GEN: No acute distress.   Neck: No JVD Cardiac: RRR, + mechanical valve click, no rubs, or gallops.  Respiratory: Clear to auscultation bilaterally. GI: Soft, nontender, non-distended  MS: No edema; No deformity. Neuro:  Nonfocal  Psych: Normal affect   Labs    High Sensitivity Troponin:   Recent Labs  Lab 11/08/21 0848  TROPONINIHS 11     Chemistry Recent Labs  Lab 11/10/21 0731 11/11/21 0054 11/12/21 0224  NA 134* 134* 133*  K 4.5 4.9  4.7  CL 104 107 104  CO2 24 23 22   GLUCOSE 117* 79 86  BUN 31* 20 12  CREATININE 1.28* 1.21* 1.17*  CALCIUM 7.6* 7.7* 7.8*  MG 2.2 2.1 1.9  PROT 4.0* 4.1* 4.3*  ALBUMIN 2.0* 2.0* 2.1*  AST 24 24 25   ALT 11 12 11   ALKPHOS 48 51 53  BILITOT 0.5 0.7 0.9  GFRNONAA 43* 46* 47*  ANIONGAP 6 4* 7    Lipids No results for input(s): "CHOL", "TRIG", "HDL", "LABVLDL", "LDLCALC", "CHOLHDL" in the last 168 hours.  Hematology Recent Labs  Lab 11/11/21 0054 11/11/21 1110 11/12/21 0224  WBC 8.2 8.5 7.2  RBC 2.81* 2.87* 2.82*  HGB 8.1* 8.2* 8.2*  HCT 24.3* 25.2* 25.7*  MCV 86.5 87.8 91.1  MCH 28.8 28.6 29.1  MCHC 33.3 32.5 31.9  RDW 17.1* 17.5* 17.9*  PLT 151 166 179   Thyroid No results for input(s): "TSH", "FREET4" in the last 168 hours.  BNPNo results for input(s): "BNP", "PROBNP" in the last 168 hours.  DDimer No results for input(s): "DDIMER" in the last 168 hours.   Radiology    No results  found.  Cardiac Studies   N/a   Patient Profile     81 y.o. 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 Candiss Norse.  Assessment & Plan    Acute GIB: presented with 4-5 days of dark stools. Hgb 4.6 on admission s/p 5 units PRBs. Underwent EGD with 4 upper GI nonbleeding AVMs felt to be source of bleeding. H/H improved around 8 and remains stable. Cleared to resume coumadin on 6/11 per GI   H/o mechanical St. [name] Mitral valve replacement in 2007 on coumadin: NR 10 and 2.5 units Vitamin K given. Cleared to resume coumadin 6/11, INR 3.1 today. -- Goal INR 2.5-3.5 -- spoke with her regarding concerns of spike in INR (2nd incidence) suggested keeping a food dairy to determine if something in her diet may be contributing  to elevated INR   Biv heart failure/PPM/defibrillator: Echo 10/30/21 showed LVEF 30-35%, normal RVSF, normal functioning mitral valve,mild AI. -- PTA lasix 40m daily, toprol 242mdaily, spironolactone 254maily -- PPM/defib followed by Dr. CroRecardo Evangelist OP   CKD stage III with AKI: Scr 1.70 on admission, improved to 1.21   Paroxsymal afib:  -- now resumed on coumadin -- Afib burden <0.1% on most recent interrogation  HLD: on statin  [facility] will sign off.   Medication Recommendations:  n/a Other recommendations (labs, testing, etc):  coumadin clinic appt Follow up as an outpatient:  will arrange follow up in the office.   For questions or updates, please contact [facility]ase consult [email or web address] for contact info under        Signed, LinReino BellisP  11/12/2021, 8:36 AM    Agree with note by LinReino Bellis-C  Hemoglobin stable status post transfusion of 6 units of packed red blood cells.  INR 3.1 on Coumadin.  Vital signs stable.  Patient without symptoms.  Results of EGD noted with nonbleeding AVMs.  Patient stable for discharge from our point of view.  Will arrange follow-up.  Patient will keep a food diary to see if there are any foods that have interacted with her Coumadin anticoagulation.  JonLorretta Harp.D., FACStannardsACTelecare El Dorado County PhfAHLaverta BaltimoreCWashington[address]uiHumphreysC  [phone]/2023 9:43 AM

## 2021-11-12 NOTE — Discharge Instructions (Signed)
Follow with Primary MD [name], MD in 7 days   Get CBC, CMP, INR -  checked next visit within 2 days by Primary MD    Activity: As tolerated with Full fall precautions use walker/cane & assistance as needed  Disposition Home   Diet: Heart Healthy   Special Instructions: If you have smoked or chewed Tobacco  in the last 2 yrs please stop smoking, stop any regular Alcohol  and or any Recreational drug use.  On your next visit with your primary care physician please Get Medicines reviewed and adjusted.  Please request your Prim.MD to go over all Hospital Tests and Procedure/Radiological results at the follow up, please get all Hospital records sent to your Prim MD by signing hospital release before you go home.  If you experience worsening of your admission symptoms, develop shortness of breath, life threatening emergency, suicidal or homicidal thoughts you must seek medical attention immediately by calling 911 or calling your MD immediately  if symptoms less severe.  You Must read complete instructions/literature along with all the possible adverse reactions/side effects for all the Medicines you take and that have been prescribed to you. Take any new Medicines after you have completely understood and accpet all the possible adverse reactions/side effects.

## 2021-11-14 LAB — TYPE AND SCREEN
ABO/RH(D): O POS
Antibody Screen: POSITIVE
Donor AG Type: NEGATIVE
Donor AG Type: NEGATIVE
Donor AG Type: NEGATIVE
Donor AG Type: NEGATIVE
Donor AG Type: NEGATIVE
Donor AG Type: NEGATIVE
Donor AG Type: NEGATIVE
PT AG Type: NEGATIVE
Unit division: 0
Unit division: 0
Unit division: 0
Unit division: 0
Unit division: 0
Unit division: 0
Unit division: 0
Unit division: 0

## 2021-11-14 LAB — BPAM RBC
Blood Product Expiration Date: 202306132359
Blood Product Expiration Date: 202306262359
Blood Product Expiration Date: 202307022359
Blood Product Expiration Date: 202307062359
Blood Product Expiration Date: 202307062359
Blood Product Expiration Date: 202307062359
Blood Product Expiration Date: 202307082359
Blood Product Expiration Date: 202307092359
ISSUE DATE / TIME: 202306091012
ISSUE DATE / TIME: 202306091206
ISSUE DATE / TIME: 202306091425
ISSUE DATE / TIME: 202306100552
ISSUE DATE / TIME: 202306101043
Unit Type and Rh: 5100
Unit Type and Rh: 5100
Unit Type and Rh: 5100
Unit Type and Rh: 5100
Unit Type and Rh: 5100
Unit Type and Rh: 5100
Unit Type and Rh: 5100
Unit Type and Rh: 9500

## 2021-11-15 ENCOUNTER — Ambulatory Visit: Payer: Medicare Other | Admitting: *Deleted

## 2021-11-15 DIAGNOSIS — Z5181 Encounter for therapeutic drug level monitoring: Secondary | ICD-10-CM | POA: Diagnosis not present

## 2021-11-15 DIAGNOSIS — Z7901 Long term (current) use of anticoagulants: Secondary | ICD-10-CM | POA: Diagnosis not present

## 2021-11-15 LAB — POCT INR: INR: 4.2 — AB (ref 2.0–3.0)

## 2021-11-15 NOTE — Patient Instructions (Signed)
Description   Hold warfarin today Then START taking Warfarin 2.5 mg daily except for 8m on Sunday, Tuesday and Thursday. Recheck INR on Tuesday 6/20. [facility] [phone] START eating 2 serving of greens per week.  While on doxy eat and extra serving of greens. Pt Started doxy 1028mBID on 6/15 and should be done by 6/21.

## 2021-11-18 NOTE — Progress Notes (Unsigned)
Office Visit    Patient Name: [name] Date of Encounter: 11/19/2021  Primary Care Provider:  [name], MD Primary Cardiologist:  Quay Burow, MD  Chief Complaint    81 year old female with a history of MVP s/p mitral valve repair in 2002, mitral valve replacement in 2007 on coumadin, NICM s/p biventricular ICD in 2012, chronic combined systolic and diastolic heart failure, paroxysmal atrial fibrillation, hypertension, hyperlipidemia, CKD stage III, hypothyroidism, GERD, fibromyalgia, GI bleed, gout, anxiety, and depression who presents for follow-up related to valvular heart disease, heart failure, and atrial fibrillation.  Past Medical History    Past Medical History:  Diagnosis Date   Allergy    seasonal   Anemia    Anxiety    Arthritis    Asthma    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 Protecta   Biventricular ICD (implantable cardioverter-defibrillator) in place    with CRT   Blood transfusion without reported diagnosis    Bursitis    Cataract    RIGHT EYE   CHF (congestive heart failure) ([place])    Chronic kidney disease    Colon polyp    adenomatous   Complication of anesthesia    patient stated that had difficulty getting the breathing tube removed, patient said that she stopped breathing and HR dropped to 10  patient then woke up and started breathing pateint stated no longer than one minute; re-intubated in PACU following cholecystectomy 10/28/13   COPD (chronic obstructive pulmonary disease) ([place])    Depression    Diverticulosis    Dysrhythmia    Fibromyalgia    GERD (gastroesophageal reflux disease)    Gout    H/O mitral valve replacement 2002, 2007   Heart murmur    Hemorrhoids    Hyperlipidemia    Hypertension    Hypothyroidism    IBS (irritable bowel syndrome)    MVP (mitral valve prolapse)    Neuromuscular disorder ([place])    fibromyalgia   Pacemaker    Peptic ulcer disease    Past Surgical  History:  Procedure Laterality Date   ABDOMINAL HYSTERECTOMY     BIV ICD GENERATOR CHANGEOUT N/A 04/29/2017   Procedure: BIV ICD GENERATOR CHANGEOUT;  Surgeon: Sanda Klein, MD;  Location: [facility];  Service: Cardiovascular;  Laterality: N/A;   CARDIAC CATHETERIZATION  02/25/2006   normal left main, normal LAD, normal L Cfx, normal/dominant RCA (Dr. [name])   [place]  2007, 11/202012   x2 (pacemaker) (Dr. Jerilynn Mages. Croitoru)   [place]  2002   MV repair - Dr. Berle Mull   CHOLECYSTECTOMY N/A 10/28/2013   Procedure: LAPAROSCOPIC CHOLECYSTECTOMY WITH INTRAOPERATIVE CHOLANGIOGRAM;  Surgeon: Edward Jolly, MD;  Location: [place];  Service: General;  Laterality: N/A;   COLONOSCOPY     ENTEROSCOPY N/A 11/09/2021   Procedure: ENTEROSCOPY;  Surgeon: Irving Copas., MD;  Location: [facility];  Service: Gastroenterology;  Laterality: N/A;   ESOPHAGOGASTRODUODENOSCOPY (EGD) WITH PROPOFOL N/A 03/02/2021   Procedure: ESOPHAGOGASTRODUODENOSCOPY (EGD) WITH PROPOFOL;  Surgeon: Daryel November, MD;  Location: [facility];  Service: Gastroenterology;  Laterality: N/A;   EYE SURGERY     HEMOSTASIS CLIP PLACEMENT  03/02/2021   Procedure: HEMOSTASIS CLIP PLACEMENT;  Surgeon: Daryel November, MD;  Location: [facility];  Service: Gastroenterology;;   HEMOSTASIS CLIP PLACEMENT  11/09/2021   Procedure: HEMOSTASIS CLIP PLACEMENT;  Surgeon: Irving Copas., MD;  Location: [facility];  Service: Gastroenterology;;  HOT HEMOSTASIS  03/02/2021   Procedure: HOT HEMOSTASIS (ARGON PLASMA COAGULATION/BICAP);  Surgeon: Daryel November, MD;  Location: [facility];  Service: Gastroenterology;;   HOT HEMOSTASIS N/A 11/09/2021   Procedure: HOT HEMOSTASIS (ARGON PLASMA COAGULATION/BICAP);  Surgeon: Irving Copas., MD;  Location: [facility];  Service: Gastroenterology;  Laterality: N/A;   IMPLANTABLE CARDIOVERTER DEFIBRILLATOR (ICD)  GENERATOR CHANGE N/A 04/10/2011   Procedure: ICD GENERATOR CHANGE;  Surgeon: Sanda Klein, MD;  Location: [facility];  Service: Cardiovascular;  Laterality: N/A;   INSERT / REPLACE / REMOVE PACEMAKER     KNEE ARTHROSCOPY     MITRAL VALVE REPLACEMENT  02/26/2006   re-do MVR w/3m St. [name] (Dr. BEllison Hughs   [place] 2005   persantine myoview - low ris, EF 63%   RIGHT HEART CATH  04/03/2006   pulm cap wedge pressure 24/24, PA pressure 43/22 (mean 320mg), CO 4.8, CI 4.1 (Dr. J.Jackie Plum  SCLEROTHERAPY  03/02/2021   Procedure: SCClide Deutscher Surgeon: CuDaryel NovemberMD;  [place] [facility] Service: Gastroenterology;;   SUBMUCOSAL TATTOO INJECTION  11/09/2021   Procedure: SUBMUCOSAL TATTOO INJECTION;  Surgeon: MaIrving Copas MD;  [place] [facility] Service: Gastroenterology;;   TOTAL SHOULDER ARTHROPLASTY Right 12/27/2015   Procedure: RIGHT TOTAL SHOULDER ARTHROPLASTY;  Surgeon: JuTania AdeMD;  [facility] Service: Orthopedics;  Laterality: Right;  Right total shoulder arthroplasty   TOTAL SHOULDER ARTHROPLASTY Left 10/27/2019   Procedure: REVERSE TOTAL SHOULDER ARTHROPLASTY;  Surgeon: ChTania AdeMD;  Location: [facility];  Service: Orthopedics;  Laterality: Left;   TRANSTHORACIC ECHOCARDIOGRAM  12/2011   EF 50-55%, mild global hypokinesis; LA severely dilated; calcification of anterior/posterior MV leaflets, bi-leaflets St. [name] mechanical MV; mild TR; trace AV regurg/pulm valve regurg    Allergies  Allergies  Allergen Reactions   Ace Inhibitors Cough   Lipitor [Atorvastatin Calcium] Other (See Comments)    Knee pain    Megace [Megestrol] Other (See Comments)    High blood pressure and blurred vision   Aspirin Other (See Comments)    Nephrologist said to not take this   Clorazepate Dipotassium Other (See Comments)    Interacts with a drug being taken   Simvastatin Other (See Comments)    Muscle pain   Tiotropium Bromide  Monohydrate Other (See Comments)    Dry mouth   Codeine Nausea And Vomiting   Tape Rash    History of Present Illness    [age]ear old female with the above past medical history including MVP s/p mitral valve repair in 2002, mitral valve replacement in 2007 on coumadin, NICM s/p biventricular ICD in 2012, chronic combined systolic and diastolic heart failure, paroxysmal atrial fibrillation, hypertension, hyperlipidemia, CKD stage III, hypothyroidism, GERD, fibromyalgia, GI bleed, gout, anxiety, and depression.  She has a history of valvular heart disease s/p mitral valve repair in 2002, with subsequent mitral valve replacement in 2007 with mechanical prosthesis, on Coumadin.  Catheterization at the time showed no evidence of CAD.  Additionally, she has a history of NICM, chronic combined systolic and diastolic heart failure EF as low as 10% previously.  She underwent CRT-D insertion for resynchronization therapy in 2008 and underwent generator change out in 2012 and 2019.  Most recent echocardiogram in May 2023 showed EF of 30 to 35%, down from 45%, moderately decreased LV function, LV global hypokinesis, indeterminate diastolic parameters, normally functioning mitral valve prosthesis (mean gradient 3 mmHg at 70 bpm), mild aortic valve  regurgitation, aortic valve sclerosis with no evidence of aortic valve stenosis.  Additionally, she has a history of atrial fibrillation.  Most recent device interrogation showed episodes of atrial fibrillation, no episodes of ventricular tachycardia.  She was last seen in the office on 11/07/2021 by Dr. Sallyanne Kuster, who follows  her for her ICD.  She reported intractable nausea and vomiting, weight loss.  Her OptiVol was in fluid overload range from 06/2021-07/2021, but was back to normal at the time of her visit.  INR was supratherapeutic at the time.  She denied any active bleeding.  Unfortunately, she presented to the ED on 11/08/2021 with complaints of black stools, increased  fatigue.  Hemoglobin was 4.6 with INR greater than 10.  She was hospitalized from 11/08/2021 to 11/12/2021 in the setting of acute GI bleed.  She received 5 units PRBC as well as vitamin K.  Cardiology was consulted given history of mechanical MVR, atrial fibrillation. 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 Her H&H stabilized, Coumadin was restarted with therapeutic INR levels.  She was discharged home in stable condition on 11/12/2021 and advised to follow-up with GI and cardiology as an outpatient.  She presents today for follow-up accompanied by her husband.  Since her hospitalization she has been stable overall from a cardiac standpoint.  She has noticed an increase in bilateral foot/ankle edema since her hospital discharge.  Her weight has been stable overall at home, she is up 2 pounds since hospital discharge by our scale.  She states she has been watching her sodium intake.  She denies any dyspnea, PND, orthopnea. She has noticed some ongoing fatigue, likely in the setting of resolving anemia. She denies any signs of active bleeding.  Other than her bilateral ankle/foot edema, she denies any additional concerns today.  Home Medications    Current Outpatient Medications  Medication Sig Dispense Refill   acetaminophen (TYLENOL) 650 MG CR tablet Take 650 mg by mouth every 8 (eight) hours as needed for pain.     allopurinol (ZYLOPRIM) 300 MG tablet Take 300 mg by mouth every evening.      ALPRAZolam (XANAX) 0.25 MG tablet Take 0.25 mg by mouth at bedtime as needed for anxiety or sleep.      antiseptic oral rinse (BIOTENE) LIQD 15 mLs by Mouth Rinse route as needed for dry mouth.     buPROPion (WELLBUTRIN SR) 150 MG 12 hr tablet Take 150 mg by mouth in the morning.     esomeprazole (NEXIUM) 20 MG capsule Take 1 capsule (20 mg total) by mouth 2 (two) times daily before a meal. (Patient taking differently: Take 20 mg by mouth daily at 12 noon.) 60  capsule 0   fluticasone (FLONASE) 50 MCG/ACT nasal spray Place 1 spray into both nostrils as needed for allergies.     furosemide (LASIX) 40 MG tablet TAKE 1 TABLET BY MOUTH DAILY. **NEEDS OV FOR FUTURE REFILLS** (Patient taking differently: Take 40 mg by mouth daily.) 90 tablet 2   icosapent Ethyl (VASCEPA) 1 g capsule TAKE 2 CAPSULES BY MOUTH 2 TIMES DAILY. (Patient taking differently: Take 2 g by mouth daily.) 120 capsule 11   iron polysaccharides (NIFEREX) 150 MG capsule Take 1 capsule (150 mg total) by mouth daily. 30 capsule 0   metoCLOPramide (REGLAN) 5 MG tablet Take 5 mg by mouth 2 (two) times daily as needed for nausea or vomiting.     metoprolol succinate (TOPROL-XL) 25 MG  24 hr tablet Take 25 mg in the morning and 50 mg (two tablets) in the evening 90 tablet 5   Multiple Vitamin (MULTIVITAMIN WITH MINERALS) TABS tablet Take 1 tablet by mouth daily.     phenylephrine (SUDAFED PE) 10 MG TABS tablet Take 10 mg by mouth at bedtime as needed (Congestion).     pravastatin (PRAVACHOL) 40 MG tablet Take 40 mg at bedtime by mouth.      PROAIR HFA 108 (90 BASE) MCG/ACT inhaler Inhale 1-2 puffs into the lungs every 6 (six) hours as needed for wheezing.     Sennosides (SENNA) 15 MG TABS Take 30 mg by mouth at bedtime.     spironolactone (ALDACTONE) 25 MG tablet Take 1 tablet (25 mg total) by mouth daily. 90 tablet 3   SYNTHROID 50 MCG tablet Take 50 mcg by mouth at bedtime.  2   VOLTAREN 1 % GEL Apply 2-4 g topically in the morning and at bedtime.     warfarin (COUMADIN) 2.5 MG tablet Take 1-2 tablets (2.5-5 mg total) by mouth See admin instructions. Take 1 tablet (2.5 mg) on Monday, Wednesday, Friday Then take 2 tablets (5 mg) on Sunday, Tuesday, Thursday and Saturday or as directed by coumadin clinic     Current Facility-Administered Medications  Medication Dose Route Frequency Provider Last Rate Last Admin   0.9 %  sodium chloride infusion  500 mL Intravenous Continuous Irene Shipper, MD          Review of Systems    She denies chest pain, palpitations, dyspnea, pnd, orthopnea, n, v, dizziness, syncope, weight gain, or early satiety. All other systems reviewed and are otherwise negative except as noted above.   Physical Exam    VS:  BP (!) 150/60   Pulse 84   Ht 5' 2"  (1.575 m)   Wt 102 lb 6.4 oz (46.4 kg)   SpO2 90%   BMI 18.73 kg/m   GEN: Well nourished, well developed, in no acute distress. HEENT: normal. Neck: Supple, no JVD, carotid bruits, or masses. Cardiac: RRR, no murmurs, rubs, or gallops. No clubbing, cyanosis, non-pitting bilateral ankle/foot edema.  Radials/DP/PT 2+ and equal bilaterally.  Respiratory:  Respirations regular and unlabored, clear to auscultation bilaterally. GI: Soft, nontender, nondistended, BS + x 4. MS: no deformity or atrophy. Skin: warm and dry, no rash. Neuro:  Strength and sensation are intact. Psych: Normal affect.  Accessory Clinical Findings    ECG personally reviewed by me today -no EKG in office today- no acute changes.  Lab Results  Component Value Date   WBC 7.2 11/12/2021   HGB 8.2 (L) 11/12/2021   HCT 25.7 (L) 11/12/2021   MCV 91.1 11/12/2021   PLT 179 11/12/2021   Lab Results  Component Value Date   CREATININE 1.17 (H) 11/12/2021   BUN 12 11/12/2021   NA 133 (L) 11/12/2021   K 4.7 11/12/2021   CL 104 11/12/2021   CO2 22 11/12/2021   Lab Results  Component Value Date   ALT 11 11/12/2021   AST 25 11/12/2021   ALKPHOS 53 11/12/2021   BILITOT 0.9 11/12/2021   Lab Results  Component Value Date   CHOL 198 11/23/2020   HDL 27 (L) 11/23/2020   LDLCALC 108 (H) 11/23/2020   TRIG 363 (H) 11/23/2020   CHOLHDL 7.3 (H) 11/23/2020    No results found for: "HGBA1C"  Assessment & Plan    1. Chronic combined systolic and diastolic heart failure/ NICM:  She underwent  CRT-D insertion for resynchronization therapy in 2008 and underwent generator change out in 2012 and 2019. Most recent echo in May 2023 showed EF of 30  to 35%, down from 45%, moderately decreased LV function, LV global hypokinesis, indeterminate diastolic parameters, normally functioning mitral valve prosthesis (mean gradient 3 mmHg at 70 bpm), mild aortic valve regurgitation, aortic valve sclerosis with no evidence of aortic valve stenosis.  She notes an increase in bilateral ankle/foot edema since hospital discharge. Otherwise, euvolemic and well compensated on exam.  She likely has mild fluid volume overload in the setting of recent hospitalization, blood transfusion. Will increase Lasix to 40 mg twice daily x7 days, followed by Lasix 40 mg daily.  Will add potassium 10 mill equivalents daily x 7 days with increased Lasix dosing.  BMET today and in 2 weeks.  Recent escalation of GDMT limited by AKI, hypotension.  Continue to monitor renal function, BP, and if stable consider further escalation of GDMT with possible addition of Entresto.  Continue daily weights, sodium restriction. Continue Lasix, metoprolol, and spironolactone.   2. Valvular heart disease: S/p mitral valve repair in 2002, mitral valve replacement in 2007, on Coumadin.  Pending INR check today.  Continue SBE prophylaxis, Coumadin.  3. H/o GI bleed: Hemoglobin as low as 4.63 recent hospitalization in June 2023.  She received 5 units PRBC. 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  Has follow-up scheduled with GI.  Hemoglobin was 8.2 at the time of discharge.  Will repeat CBC today.  4. Paroxysmal atrial fibrillation: RRR on auscultation today appears to be maintaining NSR.  Continue metoprolol, Coumadin as above.   5. Hypertension: BP elevated above goal in office today.  She states she has not taken her medication this morning.  Advised ongoing monitoring of BP at home, and to report BP consistently >130/80.  For now, continue current antihypertensive regimen.  6. Hyperlipidemia: LDL was 138 in August 2022.  Continue pravastatin.  7. CKD stage III: Creatinine was 1.17 on  11/12/2021, stable.  Repeat BMET pending today.  8. Disposition: Follow-up as scheduled with Dr. Quentin Ore, Dr. Sallyanne Kuster, and follow-up with Dr. Gwenlyn Found in 06/2022.  She will call to schedule an appointment if she feels she needs to be seen sooner.   Lenna Sciara, NP 11/19/2021, 8:29 AM

## 2021-11-19 ENCOUNTER — Ambulatory Visit (INDEPENDENT_AMBULATORY_CARE_PROVIDER_SITE_OTHER): Payer: Medicare Other

## 2021-11-19 ENCOUNTER — Other Ambulatory Visit: Payer: Self-pay

## 2021-11-19 ENCOUNTER — Encounter: Payer: Self-pay | Admitting: Nurse Practitioner

## 2021-11-19 ENCOUNTER — Ambulatory Visit: Payer: Medicare Other | Admitting: Nurse Practitioner

## 2021-11-19 VITALS — BP 150/60 | HR 84 | Ht 62.0 in | Wt 102.4 lb

## 2021-11-19 DIAGNOSIS — I5042 Chronic combined systolic (congestive) and diastolic (congestive) heart failure: Secondary | ICD-10-CM | POA: Diagnosis not present

## 2021-11-19 DIAGNOSIS — Z952 Presence of prosthetic heart valve: Secondary | ICD-10-CM

## 2021-11-19 DIAGNOSIS — Z8679 Personal history of other diseases of the circulatory system: Secondary | ICD-10-CM

## 2021-11-19 DIAGNOSIS — I5032 Chronic diastolic (congestive) heart failure: Secondary | ICD-10-CM | POA: Diagnosis not present

## 2021-11-19 DIAGNOSIS — N1832 Chronic kidney disease, stage 3b: Secondary | ICD-10-CM

## 2021-11-19 DIAGNOSIS — Z7901 Long term (current) use of anticoagulants: Secondary | ICD-10-CM | POA: Diagnosis not present

## 2021-11-19 DIAGNOSIS — E782 Mixed hyperlipidemia: Secondary | ICD-10-CM

## 2021-11-19 DIAGNOSIS — I428 Other cardiomyopathies: Secondary | ICD-10-CM

## 2021-11-19 DIAGNOSIS — Z8719 Personal history of other diseases of the digestive system: Secondary | ICD-10-CM

## 2021-11-19 DIAGNOSIS — Z95810 Presence of automatic (implantable) cardiac defibrillator: Secondary | ICD-10-CM | POA: Diagnosis not present

## 2021-11-19 DIAGNOSIS — I10 Essential (primary) hypertension: Secondary | ICD-10-CM

## 2021-11-19 LAB — CUP PACEART REMOTE DEVICE CHECK
Battery Remaining Longevity: 43 mo
Battery Voltage: 2.97 V
Brady Statistic AP VP Percent: 60.67 %
Brady Statistic AP VS Percent: 2.73 %
Brady Statistic AS VP Percent: 26.16 %
Brady Statistic AS VS Percent: 10.44 %
Brady Statistic RA Percent Paced: 62.3 %
Brady Statistic RV Percent Paced: 1.01 %
Date Time Interrogation Session: 20230620001704
HighPow Impedance: 69 Ohm
Implantable Lead Implant Date: 20080221
Implantable Lead Implant Date: 20080221
Implantable Lead Implant Date: 20080221
Implantable Lead Location: 753858
Implantable Lead Location: 753859
Implantable Lead Location: 753860
Implantable Lead Model: 4543
Implantable Lead Model: 5076
Implantable Lead Model: 7002
Implantable Lead Serial Number: 134379
Implantable Pulse Generator Implant Date: 20181128
Lead Channel Impedance Value: 304 Ohm
Lead Channel Impedance Value: 323 Ohm
Lead Channel Impedance Value: 380 Ohm
Lead Channel Impedance Value: 380 Ohm
Lead Channel Impedance Value: 399 Ohm
Lead Channel Impedance Value: 665 Ohm
Lead Channel Pacing Threshold Amplitude: 0.625 V
Lead Channel Pacing Threshold Amplitude: 1 V
Lead Channel Pacing Threshold Amplitude: 1.125 V
Lead Channel Pacing Threshold Pulse Width: 0.4 ms
Lead Channel Pacing Threshold Pulse Width: 0.4 ms
Lead Channel Pacing Threshold Pulse Width: 0.4 ms
Lead Channel Sensing Intrinsic Amplitude: 1.625 mV
Lead Channel Sensing Intrinsic Amplitude: 1.625 mV
Lead Channel Sensing Intrinsic Amplitude: 30.625 mV
Lead Channel Sensing Intrinsic Amplitude: 30.625 mV
Lead Channel Setting Pacing Amplitude: 1.5 V
Lead Channel Setting Pacing Amplitude: 1.5 V
Lead Channel Setting Pacing Amplitude: 2.25 V
Lead Channel Setting Pacing Pulse Width: 0.4 ms
Lead Channel Setting Pacing Pulse Width: 0.4 ms
Lead Channel Setting Sensing Sensitivity: 1.2 mV

## 2021-11-19 LAB — POCT INR: INR: 3 (ref 2.0–3.0)

## 2021-11-19 MED ORDER — POTASSIUM CHLORIDE CRYS ER 10 MEQ PO TBCR: 10.0000 meq | EXTENDED_RELEASE_TABLET | Freq: Every day | ORAL | 0 refills | Status: DC

## 2021-11-19 NOTE — Patient Instructions (Signed)
Continue taking 2.5 mg daily except for 17m on Sunday, Tuesday and Thursday. Recheck INR in 4 weeks. [facility] [phone]  Stay on 3 helpings of greens per week.

## 2021-11-19 NOTE — Patient Instructions (Addendum)
Medication Instructions:  Increase lasix for 7 days. Take Lasix 80 mg daily for 1 week and reduce back to Lasix 40 mg daily.  Potassium 10 meq 1 tab daily for 1 week  *If you need a refill on your cardiac medications before your next appointment, please call your pharmacy*   Lab Work: Your physician recommends that you complete labs today and return for lab work in 2 weeks CBC, BMET (today & in 2 weeks)  If you have labs (blood work) drawn today and your tests are completely normal, you will receive your results only by: [place] (if you have MyChart) OR A paper copy in the mail If you have any lab test that is abnormal or we need to change your treatment, we will call you to review the results.   Testing/Procedures: NONE ordered at this time of appointment    Follow-Up: At [facility], you and your health needs are our priority.  As part of our continuing mission to provide you with exceptional heart care, we have created designated Provider Care Teams.  These Care Teams include your primary Cardiologist (physician) and Advanced Practice Providers (APPs -  Physician Assistants and Nurse Practitioners) who all work together to provide you with the care you need, when you need it.  We recommend signing up for the patient portal called "MyChart".  Sign up information is provided on this After Visit Summary.  MyChart is used to connect with patients for Virtual Visits (Telemedicine).  Patients are able to view lab/test results, encounter notes, upcoming appointments, etc.  Non-urgent messages can be sent to your provider as well.   To learn more about what you can do with MyChart, go to [email or web address].    Your next appointment:    Keep 05/19/2022 appointment (Dr. Sallyanne Kuster) January 2024 (Dr. Gwenlyn Found) The format for your next appointment:   In Person  Provider:   Quay Burow, MD  If primary card or EP is not listed click here to update    :1}    Other  Instructions Monitor blood pressure. Report blood pressure consistently greater than 130/80  Weigh yourself EVERY morning after you go to the bathroom but before you eat or drink anything. Write this number down in a weight log/diary. If you gain 3 pounds overnight or 5 pounds in a week, call the office. Important Information About Sugar

## 2021-11-20 ENCOUNTER — Telehealth: Payer: Self-pay

## 2021-11-20 LAB — CBC
Hematocrit: 28.5 % — ABNORMAL LOW (ref 34.0–46.6)
Hemoglobin: 9.4 g/dL — ABNORMAL LOW (ref 11.1–15.9)
MCH: 29.4 pg (ref 26.6–33.0)
MCHC: 33 g/dL (ref 31.5–35.7)
MCV: 89 fL (ref 79–97)
Platelets: 308 10*3/uL (ref 150–450)
RBC: 3.2 x10E6/uL — ABNORMAL LOW (ref 3.77–5.28)
RDW: 16.8 % — ABNORMAL HIGH (ref 11.7–15.4)
WBC: 7.2 10*3/uL (ref 3.4–10.8)

## 2021-11-20 LAB — BASIC METABOLIC PANEL
BUN/Creatinine Ratio: 11 — ABNORMAL LOW (ref 12–28)
BUN: 13 mg/dL (ref 8–27)
CO2: 28 mmol/L (ref 20–29)
Calcium: 8.8 mg/dL (ref 8.7–10.3)
Chloride: 99 mmol/L (ref 96–106)
Creatinine, Ser: 1.14 mg/dL — ABNORMAL HIGH (ref 0.57–1.00)
Glucose: 92 mg/dL (ref 70–99)
Potassium: 4.5 mmol/L (ref 3.5–5.2)
Sodium: 138 mmol/L (ref 134–144)
eGFR: 49 mL/min/{1.73_m2} — ABNORMAL LOW (ref >59)

## 2021-11-20 NOTE — Telephone Encounter (Signed)
Spoke with pt. Pt was notified of lab results and recommendations.  ?

## 2021-12-03 NOTE — Progress Notes (Unsigned)
Electrophysiology Office Note:    Date:  12/04/2021   ID:  [name], DOB 1941/03/20, MRN [id number]  PCP:  [name], MD  [facility] Cardiologist:  Quay Burow, MD  [facility] Electrophysiologist:  Vickie Epley, MD   Referring MD: [name], MD   Chief Complaint: abnormal device interrogation  History of Present Illness:    [name] is a 81 y.o. female who presents for an evaluation of abnormal device interrogation at the request of Dr Sallyanne Kuster. Their medical history includes HTN, CRT-D in situ.  The patient last saw Dr. Sallyanne Kuster on November 07, 2021.  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  The mitral valve was initially repaired in 2002 and then replaced in 2007.  At that appointment she reported continued problems with nausea and vomiting with weight loss.  Her BMI is less than 18.  Her echo shows a chronically reduced ejection fraction at 30 to 35%.  Patient is with her husband today.  She tells me in the past she has not presented to the ER immediately when she was bleeding.  When she began vomiting blood in the past she did get care.  She feels chronically fatigued and short of breath.       Past Medical History:  Diagnosis Date   Allergy    seasonal   Anemia    Anxiety    Arthritis    Asthma    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 Protecta   Biventricular ICD (implantable cardioverter-defibrillator) in place    with CRT   Blood transfusion without reported diagnosis    Bursitis    Cataract    RIGHT EYE   CHF (congestive heart failure) ([place])    Chronic kidney disease    Colon polyp    adenomatous   Complication of anesthesia    patient stated that had difficulty getting the breathing tube removed, patient said that she stopped breathing and HR dropped to 10  patient then woke up and started breathing pateint stated no longer than one minute; re-intubated in PACU following  cholecystectomy 10/28/13   COPD (chronic obstructive pulmonary disease) ([place])    Depression    Diverticulosis    Dysrhythmia    Fibromyalgia    GERD (gastroesophageal reflux disease)    Gout    H/O mitral valve replacement 2002, 2007   Heart murmur    Hemorrhoids    Hyperlipidemia    Hypertension    Hypothyroidism    IBS (irritable bowel syndrome)    MVP (mitral valve prolapse)    Neuromuscular disorder ([place])    fibromyalgia   Pacemaker    Peptic ulcer disease     Past Surgical History:  Procedure Laterality Date   ABDOMINAL HYSTERECTOMY     BIV ICD GENERATOR CHANGEOUT N/A 04/29/2017   Procedure: BIV ICD GENERATOR CHANGEOUT;  Surgeon: Sanda Klein, MD;  Location: [facility];  Service: Cardiovascular;  Laterality: N/A;   CARDIAC CATHETERIZATION  02/25/2006   normal left main, normal LAD, normal L Cfx, normal/dominant RCA (Dr. [name])   [place]  2007, 11/202012   x2 (pacemaker) (Dr. Jerilynn Mages. Croitoru)   [place]  2002   MV repair - Dr. Berle Mull   CHOLECYSTECTOMY N/A 10/28/2013   Procedure: LAPAROSCOPIC CHOLECYSTECTOMY WITH INTRAOPERATIVE CHOLANGIOGRAM;  Surgeon: Edward Jolly, MD;  Location: [place];  Service: General;  Laterality: N/A;   COLONOSCOPY  ENTEROSCOPY N/A 11/09/2021   Procedure: ENTEROSCOPY;  Surgeon: Mansouraty, Telford Nab., MD;  Location: [place];  Service: Gastroenterology;  Laterality: N/A;   ESOPHAGOGASTRODUODENOSCOPY (EGD) WITH PROPOFOL N/A 03/02/2021   Procedure: ESOPHAGOGASTRODUODENOSCOPY (EGD) WITH PROPOFOL;  Surgeon: Daryel November, MD;  Location: [place];  Service: Gastroenterology;  Laterality: N/A;   EYE SURGERY     HEMOSTASIS CLIP PLACEMENT  03/02/2021   Procedure: HEMOSTASIS CLIP PLACEMENT;  Surgeon: Daryel November, MD;  Location: [place];  Service: Gastroenterology;;   HEMOSTASIS CLIP PLACEMENT  11/09/2021   Procedure: HEMOSTASIS CLIP PLACEMENT;  Surgeon: Irving Copas., MD;  Location: [place];  Service: Gastroenterology;;   HOT HEMOSTASIS  03/02/2021   Procedure: HOT HEMOSTASIS (ARGON PLASMA COAGULATION/BICAP);  Surgeon: Daryel November, MD;  Location: [place];  Service: Gastroenterology;;   HOT HEMOSTASIS N/A 11/09/2021   Procedure: HOT HEMOSTASIS (ARGON PLASMA COAGULATION/BICAP);  Surgeon: Irving Copas., MD;  Location: [place];  Service: Gastroenterology;  Laterality: N/A;   IMPLANTABLE CARDIOVERTER DEFIBRILLATOR (ICD) GENERATOR CHANGE N/A 04/10/2011   Procedure: ICD GENERATOR CHANGE;  Surgeon: Sanda Klein, MD;  Location: [place] C[facility] LAB;  Service: Cardiovascular;  Laterality: N/A;   INSERT / REPLACE / REMOVE PACEMAKER     KNEE ARTHROSCOPY     MITRAL VALVE REPLACEMENT  02/26/2006   re-do MVR w/24m St. [name] (Dr. BEllison Hughs   [place] 2005   persantine myoview - low ris, EF 63%   RIGHT HEART CATH  04/03/2006   pulm cap wedge pressure 24/24, PA pressure 43/22 (mean 335mg), CO 4.8, CI 4.1 (Dr. J.Jackie Plum  SCLEROTHERAPY  03/02/2021   Procedure: SCClide Deutscher Surgeon: CuDaryel NovemberMD;  [place] Service: Gastro[place]ology;;   SUBMUCOSAL TATTOO INJECTION  11/09/2021   Procedure: SUBMUCOSAL TATTOO INJECTION;  Surgeon: MaIrving Copas MD;  [place] [facility] Service: Gastroenterology;;   TOTAL SHOULDER ARTHROPLASTY Right 12/27/2015   Procedure: RIGHT TOTAL SHOULDER ARTHROPLASTY;  Surgeon: JuTania AdeMD;  [place] [facility] Service: Orthopedics;  Laterality: Right;  Right total shoulder arthroplasty   TOTAL SHOULDER ARTHROPLASTY Left 10/27/2019   Procedure: REVERSE TOTAL SHOULDER ARTHROPLASTY;  Surgeon: ChTania AdeMD;  Location: [facility];  Service: Orthopedics;  Laterality: Left;   TRANSTHORACIC ECHOCARDIOGRAM  12/2011   EF 50-55%, mild global hypokinesis; LA severely dilated; calcification of anterior/posterior MV leaflets, bi-leaflets St. [name] mechanical MV; mild  TR; trace AV regurg/pulm valve regurg    Current Medications: Current Meds  Medication Sig   acetaminophen (TYLENOL) 650 MG CR tablet Take 650 mg by mouth every 8 (eight) hours as needed for pain.   allopurinol (ZYLOPRIM) 300 MG tablet Take 300 mg by mouth every evening.    ALPRAZolam (XANAX) 0.25 MG tablet Take 0.25 mg by mouth at bedtime as needed for anxiety or sleep.    antiseptic oral rinse (BIOTENE) LIQD 15 mLs by Mouth Rinse route as needed for dry mouth.   buPROPion (WELLBUTRIN SR) 150 MG 12 hr tablet Take 150 mg by mouth in the morning.   esomeprazole (NEXIUM) 20 MG capsule Take 1 capsule (20 mg total) by mouth 2 (two) times daily before a meal. (Patient taking differently: Take 20 mg by mouth daily at 12 noon.)   fluticasone (FLONASE) 50 MCG/ACT nasal spray Place 1 spray into both nostrils as needed for allergies.   furosemide (LASIX) 40 MG tablet TAKE 1 TABLET BY MOUTH DAILY. **NEEDS OV FOR FUTURE REFILLS** (Patient taking differently:  Take 40 mg by mouth daily.)   icosapent Ethyl (VASCEPA) 1 g capsule TAKE 2 CAPSULES BY MOUTH 2 TIMES DAILY. (Patient taking differently: Take 2 g by mouth daily.)   iron polysaccharides (NIFEREX) 150 MG capsule Take 1 capsule (150 mg total) by mouth daily.   metoprolol succinate (TOPROL-XL) 25 MG 24 hr tablet Take 25 mg in the morning and 50 mg (two tablets) in the evening   Multiple Vitamin (MULTIVITAMIN WITH MINERALS) TABS tablet Take 1 tablet by mouth daily.   phenylephrine (SUDAFED PE) 10 MG TABS tablet Take 10 mg by mouth at bedtime as needed (Congestion).   potassium chloride (KLOR-CON M) 10 MEQ tablet Take 1 tablet (10 mEq total) by mouth daily.   pravastatin (PRAVACHOL) 40 MG tablet Take 40 mg at bedtime by mouth.    PROAIR HFA 108 (90 BASE) MCG/ACT inhaler Inhale 1-2 puffs into the lungs every 6 (six) hours as needed for wheezing.   Sennosides (SENNA) 15 MG TABS Take 30 mg by mouth at bedtime.   spironolactone (ALDACTONE) 25 MG tablet Take 1  tablet (25 mg total) by mouth daily.   SYNTHROID 50 MCG tablet Take 50 mcg by mouth at bedtime.   VOLTAREN 1 % GEL Apply 2-4 g topically in the morning and at bedtime.   warfarin (COUMADIN) 2.5 MG tablet Take 1-2 tablets (2.5-5 mg total) by mouth See admin instructions. Take 1 tablet (2.5 mg) on Monday, Wednesday, Friday Then take 2 tablets (5 mg) on Sunday, Tuesday, Thursday and Saturday or as directed by coumadin clinic   Current Facility-Administered Medications for the 12/04/21 encounter (Office Visit) with Vickie Epley, MD  Medication   0.9 %  sodium chloride infusion     Allergies:   Ace inhibitors, Lipitor [atorvastatin calcium], Megace [megestrol], Aspirin, Clorazepate dipotassium, Simvastatin, Tiotropium bromide monohydrate, Codeine, and Tape   Social History   Socioeconomic History   Marital status: Married    Spouse name: [name]   Number of children: 4   Years of education: 12   Highest education level: Not on file  Occupational History   Occupation: retired  Tobacco Use   Smoking status: Former    Types: Cigarettes    Quit date: 06/02/2001    Years since quitting: 20.5   Smokeless tobacco: Never  Vaping Use   Vaping Use: Never used  Substance and Sexual Activity   Alcohol use: Yes    Alcohol/week: 0.0 standard drinks of alcohol    Comment: social-1 every 6 months   Drug use: No   Sexual activity: Not on file  Other Topics Concern   Not on file  Social History Narrative   Drinks very little caffeine    Social Determinants of Health   Financial Resource Strain: Not on file  Food Insecurity: Not on file  Transportation Needs: Not on file  Physical Activity: Not on file  Stress: Not on file  Social Connections: Not on file     Family History: The patient's family history includes Breast cancer in her sister; Crohn's disease in her sister; Heart attack in her father and mother; Heart disease in her father, mother, sister, and son; Kidney disease in her  father; Uterine cancer in her sister. There is no history of Colon cancer or Stomach cancer.  ROS:   Please see the history of present illness.    All other systems reviewed and are negative.  EKGs/Labs/Other Studies Reviewed:    The following studies were reviewed today:  December 04, 2021 device interrogation in clinic personally reviewed Battery longevity 3.5 years Lead parameters are stable.  Known atrial far field A-fib burden less than 0.1% Extensive review of CRT pacing percentage trends.  It does correlate with her anemia history.  EKG:  The ekg ordered today demonstrates AV sequential pacing.  BiV pacing.  Chest x-ray personally reviewed shows BiV ICD with epicardial LV lead.   Recent Labs: 11/12/2021: ALT 11; Magnesium 1.9 11/19/2021: BUN 13; Creatinine, Ser 1.14; Hemoglobin 9.4; Platelets 308; Potassium 4.5; Sodium 138  Recent Lipid Panel    Component Value Date/Time   CHOL 198 11/23/2020 1007   TRIG 363 (H) 11/23/2020 1007   HDL 27 (L) 11/23/2020 1007   CHOLHDL 7.3 (H) 11/23/2020 1007   CHOLHDL 8.2 12/09/2006 0410   VLDL 45 (H) 12/09/2006 0410   LDLCALC 108 (H) 11/23/2020 1007    Physical Exam:    VS:  BP 128/64   Pulse 69   Ht 5' 2"  (1.575 m)   Wt 98 lb 6.4 oz (44.6 kg)   SpO2 98%   BMI 18.00 kg/m     Wt Readings from Last 3 Encounters:  12/04/21 98 lb 6.4 oz (44.6 kg)  11/19/21 102 lb 6.4 oz (46.4 kg)  11/08/21 100 lb 4.8 oz (45.5 kg)     GEN:  Well nourished, well developed in no acute distress.  Thin HEENT: Normal NECK: No JVD; No carotid bruits LYMPHATICS: No lymphadenopathy CARDIAC: RRR, no murmurs, rubs, gallops.  ICD pocket well-healed RESPIRATORY:  Clear to auscultation without rales, wheezing or rhonchi  ABDOMEN: Soft, non-tender, non-distended MUSCULOSKELETAL:  No edema; No deformity  SKIN: Warm and dry NEUROLOGIC:  Alert and oriented x 3 PSYCHIATRIC:  Normal affect       ASSESSMENT:    1. Paroxysmal atrial fibrillation (HCC)   2.  Biventricular ICD (implantable cardioverter-defibrillator) in place   3. Chronic combined systolic and diastolic heart failure (HCC)    PLAN:    In order of problems listed above:  #Chronic combined systolic and diastolic heart failure #BiV ICD in situ NYHA class II-III.  Warm and dry on exam.  ICD in the past has shown poor BiV pacing percentage.  I reviewed her clinical history in detail including her trends and hemoglobin.  It seems that when she is anemic her BiV pacing percentage begins to fall.  I suspect this is due to electrical irritability with PVCs/junctional rhythms during the times of her anemia.  Once the anemia resolves the BiV pacing percentage returns to nearly 100%.  Today I have increased her metoprolol succinate to 50 mg by mouth twice daily to hopefully suppress her electrical irritability during these times.  I have encouraged her to seek medical care should she experience any recurrence of GI bleeding.  #Atrial lead far field Atrial lead diagnostics are not ideal although we have programmed the device to function appropriately.  Not an ideal candidate for lead revision.  Note sent to Dr. Sallyanne Kuster.  Follow up with me on an as-needed basis.  Medication Adjustments/Labs and Tests Ordered: Current medicines are reviewed at length with the patient today.  Concerns regarding medicines are outlined above.  Orders Placed This Encounter  Procedures   EKG 12-Lead   No orders of the defined types were placed in this encounter.    Signed, Hilton Cork. Quentin Ore, MD, [facility], [facility] 12/04/2021 2:31 PM    Electrophysiology [facility]

## 2021-12-04 ENCOUNTER — Encounter: Payer: Self-pay | Admitting: Cardiology

## 2021-12-04 ENCOUNTER — Ambulatory Visit (INDEPENDENT_AMBULATORY_CARE_PROVIDER_SITE_OTHER): Payer: Medicare Other | Admitting: Cardiology

## 2021-12-04 VITALS — BP 128/64 | HR 69 | Ht 62.0 in | Wt 98.4 lb

## 2021-12-04 DIAGNOSIS — Z95810 Presence of automatic (implantable) cardiac defibrillator: Secondary | ICD-10-CM

## 2021-12-04 DIAGNOSIS — I480 Paroxysmal atrial fibrillation: Secondary | ICD-10-CM

## 2021-12-04 DIAGNOSIS — I5042 Chronic combined systolic (congestive) and diastolic (congestive) heart failure: Secondary | ICD-10-CM | POA: Diagnosis not present

## 2021-12-04 MED ORDER — METOPROLOL SUCCINATE ER 50 MG PO TB24: 50.0000 mg | ORAL_TABLET | Freq: Two times a day (BID) | ORAL | 3 refills | Status: DC

## 2021-12-04 NOTE — Progress Notes (Signed)
Remote ICD transmission.   

## 2021-12-04 NOTE — Patient Instructions (Addendum)
Medication Instructions:  Increase metoprolol succinate to 50 mg two times a day Your physician recommends that you continue on your current medications as directed. Please refer to the Current Medication list given to you today. *If you need a refill on your cardiac medications before your next appointment, please call your pharmacy*  Lab Work: None. If you have labs (blood work) drawn today and your tests are completely normal, you will receive your results only by: [place] (if you have MyChart) OR A paper copy in the mail If you have any lab test that is abnormal or we need to change your treatment, we will call you to review the results.  Testing/Procedures: None.  Follow-Up: At [facility], you and your health needs are our priority.  As part of our continuing mission to provide you with exceptional heart care, we have created designated Provider Care Teams.  These Care Teams include your primary Cardiologist (physician) and Advanced Practice Providers (APPs -  Physician Assistants and Nurse Practitioners) who all work together to provide you with the care you need, when you need it.  Your physician wants you to follow-up in: As needed with Dr. Quentin Ore.   We recommend signing up for the patient portal called "MyChart".  Sign up information is provided on this After Visit Summary.  MyChart is used to connect with patients for Virtual Visits (Telemedicine).  Patients are able to view lab/test results, encounter notes, upcoming appointments, etc.  Non-urgent messages can be sent to your provider as well.   To learn more about what you can do with MyChart, go to [email or web address].    Any Other Special Instructions Will Be Listed Below (If Applicable).

## 2021-12-05 LAB — BASIC METABOLIC PANEL
BUN/Creatinine Ratio: 14 (ref 12–28)
BUN: 20 mg/dL (ref 8–27)
CO2: 22 mmol/L (ref 20–29)
Calcium: 9.6 mg/dL (ref 8.7–10.3)
Chloride: 97 mmol/L (ref 96–106)
Creatinine, Ser: 1.44 mg/dL — ABNORMAL HIGH (ref 0.57–1.00)
Glucose: 112 mg/dL — ABNORMAL HIGH (ref 70–99)
Potassium: 4.2 mmol/L (ref 3.5–5.2)
Sodium: 138 mmol/L (ref 134–144)
eGFR: 37 mL/min/{1.73_m2} — ABNORMAL LOW (ref >59)

## 2021-12-09 ENCOUNTER — Encounter: Payer: Medicare Other | Admitting: Cardiovascular Disease

## 2021-12-11 ENCOUNTER — Other Ambulatory Visit: Payer: Self-pay

## 2021-12-11 DIAGNOSIS — Z79899 Other long term (current) drug therapy: Secondary | ICD-10-CM

## 2021-12-11 DIAGNOSIS — I10 Essential (primary) hypertension: Secondary | ICD-10-CM

## 2021-12-11 DIAGNOSIS — E782 Mixed hyperlipidemia: Secondary | ICD-10-CM

## 2021-12-12 ENCOUNTER — Telehealth: Payer: Self-pay

## 2021-12-12 ENCOUNTER — Other Ambulatory Visit: Payer: Self-pay

## 2021-12-12 DIAGNOSIS — Z79899 Other long term (current) drug therapy: Secondary | ICD-10-CM

## 2021-12-12 DIAGNOSIS — I10 Essential (primary) hypertension: Secondary | ICD-10-CM

## 2021-12-12 DIAGNOSIS — E782 Mixed hyperlipidemia: Secondary | ICD-10-CM

## 2021-12-12 NOTE — Telephone Encounter (Signed)
Spoke with pt. Pt was notified of lab results and recommendations. Pt will follow up and repeat lab work (BMET) in 2 weeks.

## 2021-12-17 ENCOUNTER — Ambulatory Visit: Payer: Medicare Other | Admitting: *Deleted

## 2021-12-17 DIAGNOSIS — Z7901 Long term (current) use of anticoagulants: Secondary | ICD-10-CM

## 2021-12-17 DIAGNOSIS — Z952 Presence of prosthetic heart valve: Secondary | ICD-10-CM | POA: Diagnosis not present

## 2021-12-17 LAB — POCT INR: INR: 4.6 — AB (ref 2.0–3.0)

## 2021-12-17 NOTE — Patient Instructions (Signed)
Description   Do not take any Warfarin tonight then continue taking 2.5 mg daily except for 4m on Sunday, Tuesday and Thursday. Recheck INR in 3 weeks. Coumadin Clinic [phone] [phone] Have some leafy veggies today and stay consistent with 3 helpings of greens per week.

## 2021-12-25 ENCOUNTER — Ambulatory Visit: Payer: Medicare Other | Admitting: Physician Assistant

## 2021-12-25 ENCOUNTER — Encounter: Payer: Self-pay | Admitting: Physician Assistant

## 2021-12-25 ENCOUNTER — Other Ambulatory Visit (INDEPENDENT_AMBULATORY_CARE_PROVIDER_SITE_OTHER): Payer: Medicare Other

## 2021-12-25 VITALS — BP 100/60 | HR 69 | Ht 62.0 in | Wt 99.0 lb

## 2021-12-25 DIAGNOSIS — D539 Nutritional anemia, unspecified: Secondary | ICD-10-CM

## 2021-12-25 DIAGNOSIS — K5520 Angiodysplasia of colon without hemorrhage: Secondary | ICD-10-CM

## 2021-12-25 LAB — CBC WITH DIFFERENTIAL/PLATELET
Basophils Absolute: 0 10*3/uL (ref 0.0–0.1)
Basophils Relative: 0.5 % (ref 0.0–3.0)
Eosinophils Absolute: 0.2 10*3/uL (ref 0.0–0.7)
Eosinophils Relative: 3.9 % (ref 0.0–5.0)
HCT: 35.6 % — ABNORMAL LOW (ref 36.0–46.0)
Hemoglobin: 11.8 g/dL — ABNORMAL LOW (ref 12.0–15.0)
Lymphocytes Relative: 6.7 % — ABNORMAL LOW (ref 12.0–46.0)
Lymphs Abs: 0.4 10*3/uL — ABNORMAL LOW (ref 0.7–4.0)
MCHC: 33 g/dL (ref 30.0–36.0)
MCV: 91.6 fl (ref 78.0–100.0)
Monocytes Absolute: 0.4 10*3/uL (ref 0.1–1.0)
Monocytes Relative: 5.8 % (ref 3.0–12.0)
Neutro Abs: 5 10*3/uL (ref 1.4–7.7)
Neutrophils Relative %: 83.1 % — ABNORMAL HIGH (ref 43.0–77.0)
Platelets: 211 10*3/uL (ref 150.0–400.0)
RBC: 3.88 Mil/uL (ref 3.87–5.11)
RDW: 21 % — ABNORMAL HIGH (ref 11.5–15.5)
WBC: 6.1 10*3/uL (ref 4.0–10.5)

## 2021-12-25 LAB — PROTIME-INR
INR: 4.1 ratio — ABNORMAL HIGH (ref 0.8–1.0)
Prothrombin Time: 40.9 s — ABNORMAL HIGH (ref 9.6–13.1)

## 2021-12-25 MED ORDER — ESOMEPRAZOLE MAGNESIUM 40 MG PO CPDR: 40.0000 mg | DELAYED_RELEASE_CAPSULE | Freq: Every day | ORAL | 8 refills | Status: DC

## 2021-12-25 NOTE — Patient Instructions (Addendum)
If you are age 79 or older, your body mass index should be between 23-30. Your Body mass index is 18.11 kg/m. If this is out of the aforementioned range listed, please consider follow up with your Primary Care Provider. ________________________________________________________  The [facility] providers would like to encourage you to use [facility] to communicate with providers for non-urgent requests or questions.  Due to long hold times on the telephone, sending your provider a message by [facility] may be a faster and more efficient way to get a response.  Please allow 48 business hours for a response.  Please remember that this is for non-urgent requests.  _______________________________________________________  Your provider has requested that you go to the basement level for lab work before leaving today. Press "B" on the elevator. The lab is located at the first door on the left as you exit the elevator.  Continue Nexium 40 mg before breakfast  Thank you for entrusting me with your care and choosing [facility].  Amy Esterwood, PA-C

## 2021-12-25 NOTE — Progress Notes (Signed)
Assessment and plan noted

## 2021-12-25 NOTE — Progress Notes (Signed)
Subjective:    Patient ID: [name], female    DOB: 1940/06/08, 81 y.o.   MRN: [id number]  HPI [name] is a very nice 81 year old white female, established with Dr. Henrene Pastor who comes in today for post hospital follow-up after hospitalization 6/9 through 11/12/2021 with a GI bleed.  She was seen by GI during that admission and underwent enteroscopy per Dr. 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  Both episodes of GI bleeding occurred in the setting of supratherapeutic INR.  With this most recent hospitalization she had hemoglobin of 4.6 on admission and INR greater than 10.  She says that she has been very fatigued for several years and has not had any other symptoms to give her warning of anemia or more low-grade bleeding until she has onset of obvious melena or dark red blood. She has had difficulty with regulation of her INR just over the past several months, this is never been an issue in the past. She has history of congestive heart failure, status post ICD, pacemaker and is status post [facility] [name] mitral valve replacement.  She has chronic atrial fibrillation, hypertension, COPD, asthma, GERD, diverticulosis and chronic kidney disease and is maintained on chronic Coumadin.  She had her INR checked on 12/17/2021, high at 4.6 and has had adjustment to Coumadin since. She has not had hemoglobin checked her knowledge since she was discharged from the hospital.  Again hemoglobin on admission 11/08/2021 was 4.6 on discharge 11/12/2021 hemoglobin 8.2.  Follow-up hemoglobin on 11/19/2021 was 9.4.  She currently has no GI complaints, no complaints of heartburn or indigestion, no nausea, no abdominal discomfort, bowel movements are moving regularly and she  has not noted any melena or hematochezia since discharge from the hospital. She is on Niferex once daily, and Nexium 20 mg twice daily    Review of Systems Pertinent positive and negative review of systems were noted in the above HPI section.  All other review of systems was otherwise negative.   Outpatient Encounter Medications as of 12/25/2021  Medication Sig   acetaminophen (TYLENOL) 650 MG CR tablet Take 650 mg by mouth every 8 (eight) hours as needed for pain.   allopurinol (ZYLOPRIM) 300 MG tablet Take 300 mg by mouth every evening.    ALPRAZolam (XANAX) 0.25 MG tablet Take 0.25 mg by mouth at bedtime as needed for anxiety or sleep.    antiseptic oral rinse (BIOTENE) LIQD 15 mLs by Mouth Rinse route as needed for dry mouth.   buPROPion (WELLBUTRIN SR) 150 MG 12 hr tablet Take 150 mg by mouth in the morning.   fluticasone (FLONASE) 50 MCG/ACT nasal spray Place 1 spray into both nostrils as needed for allergies.   furosemide (LASIX) 40 MG tablet TAKE 1 TABLET BY MOUTH DAILY. **NEEDS OV FOR FUTURE REFILLS** (Patient taking differently: Take 40 mg by mouth daily.)   icosapent Ethyl (VASCEPA) 1 g capsule TAKE 2 CAPSULES BY MOUTH 2 TIMES DAILY. (Patient taking differently: Take 2 g by mouth daily.)   iron polysaccharides (NIFEREX) 150 MG capsule Take 1 capsule (150 mg total) by mouth daily.   metoCLOPramide (REGLAN) 5 MG tablet Take 5 mg by mouth 2 (two) times daily as needed for nausea  or vomiting.   metoprolol succinate (TOPROL-XL) 50 MG 24 hr tablet Take 1 tablet (50 mg total) by mouth 2 (two) times daily. Take with or immediately following a meal.   Multiple Vitamin (MULTIVITAMIN WITH MINERALS) TABS tablet Take 1 tablet by mouth daily.   phenylephrine (SUDAFED PE) 10 MG TABS tablet Take 10 mg by mouth at bedtime as needed (Congestion).   potassium chloride (KLOR-CON M) 10 MEQ tablet Take 1 tablet (10 mEq total) by mouth daily.   pravastatin (PRAVACHOL) 40 MG tablet Take 40 mg at bedtime by  mouth.    PROAIR HFA 108 (90 BASE) MCG/ACT inhaler Inhale 1-2 puffs into the lungs every 6 (six) hours as needed for wheezing.   Sennosides (SENNA) 15 MG TABS Take 30 mg by mouth at bedtime.   spironolactone (ALDACTONE) 25 MG tablet Take 1 tablet (25 mg total) by mouth daily.   SYNTHROID 50 MCG tablet Take 50 mcg by mouth at bedtime.   VOLTAREN 1 % GEL Apply 2-4 g topically in the morning and at bedtime.   warfarin (COUMADIN) 2.5 MG tablet Take 1-2 tablets (2.5-5 mg total) by mouth See admin instructions. Take 1 tablet (2.5 mg) on Monday, Wednesday, Friday Then take 2 tablets (5 mg) on Sunday, Tuesday, Thursday and Saturday or as directed by coumadin clinic   [DISCONTINUED] esomeprazole (NEXIUM) 20 MG capsule Take 1 capsule (20 mg total) by mouth 2 (two) times daily before a meal. (Patient taking differently: Take 20 mg by mouth daily at 12 noon.)   esomeprazole (NEXIUM) 40 MG capsule Take 1 capsule (40 mg total) by mouth daily before breakfast.   Facility-Administered Encounter Medications as of 12/25/2021  Medication   0.9 %  sodium chloride infusion   Allergies  Allergen Reactions   Ace Inhibitors Cough   Lipitor [Atorvastatin Calcium] Other (See Comments)    Knee pain    Megace [Megestrol] Other (See Comments)    High blood pressure and blurred vision   Aspirin Other (See Comments)    Nephrologist said to not take this   Clorazepate Dipotassium Other (See Comments)    Interacts with a drug being taken   Simvastatin Other (See Comments)    Muscle pain   Tiotropium Bromide Monohydrate Other (See Comments)    Dry mouth   Codeine Nausea And Vomiting   Tape Rash   Patient Active Problem List   Diagnosis Date Noted   Acute upper GI bleeding 11/08/2021   Acute GI bleeding 03/01/2021   Coagulopathy ([place]) 03/01/2021   Hematochezia    Acute blood loss anemia    Supratherapeutic INR    Status post reverse total shoulder replacement, left 10/27/2019   ICD (implantable  cardioverter-defibrillator) lead failure 07/09/2018   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   Accelerated junctional rhythm 01/29/2018   Peptic ulcer disease    Pacemaker    Neuromuscular disorder ([place])    MVP (mitral valve prolapse)    IBS (irritable bowel syndrome)    Hypothyroidism    Hemorrhoids    Hypertension    Mixed hyperlipidemia    Heart murmur    H/O mitral valve replacement    Gout    GERD (gastroesophageal reflux disease)    Fibromyalgia    Dysrhythmia    Diverticulosis    Depression    COPD (chronic obstructive pulmonary disease) (HCC)    Complication of anesthesia    Colon polyp    Chronic kidney disease    CHF (congestive heart failure) ([place])  Cataract    Bursitis    Asthma    Arthritis    Anxiety    Anemia    Allergy    Pacemaker battery depletion 04/28/2017   Biventricular automatic implantable cardioverter defibrillator at end of life 04/28/2017   Malnutrition of moderate degree ([place]: 60% to less than 75% of standard weight) ([place]) 03/30/2017   CKD (chronic kidney disease) stage 3, GFR 30-59 ml/min (HCC) 03/30/2017   Mild protein-calorie malnutrition (HCC) 09/22/2016   Chronic diastolic (congestive) heart failure (HCC) 03/25/2016   Paroxysmal atrial fibrillation ([place]) 03/25/2016   S/P shoulder replacement 12/27/2015   Cholelithiasis with cholecystitis 10/28/2013   Long term (current) use of anticoagulants 08/19/2012   Biventricular ICD (implantable cardioverter-defibrillator) in place 04/10/2011   History of mitral valve replacement with mechanical valve 04/10/2011   CHEST PAIN 01/24/2010   Automatic implantable cardioverter-defibrillator in situ 01/24/2010   RECTAL BLEEDING 08/01/2009   NAUSEA AND VOMITING 08/01/2009   UTI'S, HX OF 08/01/2009   PERSONAL HX COLONIC POLYPS 08/01/2008   HYPERLIPIDEMIA 07/31/2008   Deficiency anemia 07/31/2008   INTERNAL HEMORRHOIDS 07/31/2008   GERD 07/31/2008   DIVERTICULOSIS, COLON 07/31/2008   BURSITIS  07/31/2008   Social History   Socioeconomic History   Marital status: Married    Spouse name: [name]   Number of children: 4   Years of education: 12   Highest education level: Not on file  Occupational History   Occupation: retired  Tobacco Use   Smoking status: Former    Types: Cigarettes    Quit date: 06/02/2001    Years since quitting: 20.5   Smokeless tobacco: Never  Vaping Use   Vaping Use: Never used  Substance and Sexual Activity   Alcohol use: Yes    Alcohol/week: 0.0 standard drinks of alcohol    Comment: social-1 every 6 months   Drug use: No   Sexual activity: Not on file  Other Topics Concern   Not on file  Social History Narrative   Drinks very little caffeine    Social Determinants of Health   Financial Resource Strain: Not on file  Food Insecurity: Not on file  Transportation Needs: Not on file  Physical Activity: Not on file  Stress: Not on file  Social Connections: Not on file  Intimate Partner Violence: Not on file    Ms. Knight's family history includes Breast cancer in her sister; Crohn's disease in her sister; Heart attack in her father and mother; Heart disease in her father, mother, sister, and son; Kidney disease in her father; Uterine cancer in her sister.      Objective:    Vitals:   12/25/21 1408  BP: 100/60  Pulse: 69    Physical Exam Well-developed thin, frail-appearing elderly white female in no acute distress.  Accompanied by her husband height, Weight, 99 BMI 18.1  HEENT; nontraumatic normocephalic, EOMI, PE R LA, sclera anicteric. Oropharynx; not examined today Neck; supple, no JVD Cardiovascular; regular rate and rhythm with T2-P4, valve click present Pulmonary; Clear bilaterally Abdomen; soft, nontender, nondistended, no palpable mass or hepatosplenomegaly, bowel sounds are active Rectal; not done today Skin; benign exam, no jaundice rash or appreciable lesions Extremities; no clubbing cyanosis or edema skin warm and  dry Neuro/Psych; alert and oriented x4, grossly nonfocal mood and affect appropriate        Assessment & Plan:   #25 81 year old white female with history of recurrent GI bleeding, with hospitalization fall 2022 and again in June 2023 with marked anemia,  and melena in setting of chronic Coumadin use with supratherapeutic INR  Work-up October 2022 with EGD 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  Has not had any melena or hematochezia since discharge from the hospital, has chronic ongoing fatigue.  Cardiology is still having difficulty regulating her INR and have been adjusting her Coumadin dosage.  #2 status post [facility] [name] mitral valve replacement #3.  Coronary artery disease #4.  Congestive heart failure status post ICD and pacemaker #5.  COPD #6.  Asthma #7.  GERD stable on Nexium #8.  Chronic kidney disease #9.  Atrial fibrillation #10 history of diverticulosis and adenomatous colon polyps, last colonoscopy 2015  Plan; Continue Nexium 20 mg p.o. twice daily Continue Niferex once daily-ferritin was normal October 2022, not repeated since-we will repeat iron studies, at next follow-up hemoglobin Patient needs to have closer serial monitoring of her hemoglobins moving forward so hopefully we can prevent episodes of profound anemia. Also very important to keep INR in a therapeutic range as both of her episodes of bleeding have occurred with supratherapeutic INR. We will check CBC and pro time/INR today-and plan to monitor every 3 to 4 weeks with CBC.  Gayanne Prescott Genia Harold PA-C 12/25/2021   Cc: [name], MD

## 2021-12-27 LAB — BASIC METABOLIC PANEL
BUN/Creatinine Ratio: 18 (ref 12–28)
BUN: 26 mg/dL (ref 8–27)
CO2: 24 mmol/L (ref 20–29)
Calcium: 9.5 mg/dL (ref 8.7–10.3)
Chloride: 97 mmol/L (ref 96–106)
Creatinine, Ser: 1.43 mg/dL — ABNORMAL HIGH (ref 0.57–1.00)
Glucose: 119 mg/dL — ABNORMAL HIGH (ref 70–99)
Potassium: 4.6 mmol/L (ref 3.5–5.2)
Sodium: 139 mmol/L (ref 134–144)
eGFR: 37 mL/min/{1.73_m2} — ABNORMAL LOW (ref >59)

## 2021-12-31 ENCOUNTER — Telehealth: Payer: Self-pay

## 2021-12-31 NOTE — Telephone Encounter (Signed)
Lmom waiting on a return call to discuss lab results.

## 2021-12-31 NOTE — Telephone Encounter (Signed)
Pt returned called. Pt was notified of lab results. Pt will continue her current medication and follow up as planned.

## 2022-01-08 ENCOUNTER — Ambulatory Visit: Payer: Medicare Other | Admitting: *Deleted

## 2022-01-08 DIAGNOSIS — Z7901 Long term (current) use of anticoagulants: Secondary | ICD-10-CM | POA: Diagnosis not present

## 2022-01-08 DIAGNOSIS — Z952 Presence of prosthetic heart valve: Secondary | ICD-10-CM

## 2022-01-08 LAB — POCT INR: INR: 3.5 — AB (ref 2.0–3.0)

## 2022-01-08 NOTE — Patient Instructions (Signed)
Description   Continue taking 2.5 mg daily except for 42m on Sunday, Tuesday, and Thursday. Recheck INR in 4 weeks. Coumadin Clinic [phone] [phone] Have some leafy veggies today and stay consistent with 3 helpings of greens per week.

## 2022-01-16 ENCOUNTER — Telehealth: Payer: Self-pay | Admitting: Internal Medicine

## 2022-01-16 NOTE — Telephone Encounter (Signed)
Spoke with patient. Explained her [employer] grant expired 07/2021 therefore she cannot access funds. Explained grant is closed for enrollments at present time. Explained I do not know when the funds will open again. Suggested she check on this at various time or sign up for email alerts.

## 2022-01-16 NOTE — Telephone Encounter (Signed)
Patient called to follow up on the paperwork from [place] (ph# [phone], [email or web address], fax# [phone]) to approve assistance with her cholesterol medication.  Patient stated Dr. Debara Pickett will need to send back documents to update 2023 information.

## 2022-02-05 ENCOUNTER — Ambulatory Visit: Payer: Medicare Other | Attending: Cardiovascular Disease | Admitting: *Deleted

## 2022-02-05 ENCOUNTER — Telehealth: Payer: Self-pay

## 2022-02-05 ENCOUNTER — Telehealth: Payer: Self-pay | Admitting: Cardiovascular Disease

## 2022-02-05 ENCOUNTER — Other Ambulatory Visit: Payer: Self-pay

## 2022-02-05 DIAGNOSIS — Z7901 Long term (current) use of anticoagulants: Secondary | ICD-10-CM

## 2022-02-05 DIAGNOSIS — D539 Nutritional anemia, unspecified: Secondary | ICD-10-CM

## 2022-02-05 DIAGNOSIS — Z952 Presence of prosthetic heart valve: Secondary | ICD-10-CM | POA: Diagnosis not present

## 2022-02-05 LAB — PROTIME-INR
INR: 6.5 (ref 0.9–1.2)
Prothrombin Time: 59.5 s — ABNORMAL HIGH (ref 9.1–12.0)

## 2022-02-05 LAB — POCT INR: INR: 7.1 — AB (ref 2.0–3.0)

## 2022-02-05 NOTE — Telephone Encounter (Addendum)
Received call to triage from [place]. INR 6.5, PT 59.5.

## 2022-02-05 NOTE — Patient Instructions (Signed)
Description   @ 237pm, spoke with [place] with [place] INR 6.5, Spoke with pt @ 245pm instructed pt to hold warfarin dose today, hold warfarin dose tomorrow, and hold warfarin dose Friday then resume taking 2.5 mg daily except for 4m on Sunday, Tuesday, and Thursday. Recheck INR in 1 week. Coumadin Clinic [phone] [phone] Have some leafy veggies today and stay consistent with 3 helpings of greens per week.

## 2022-02-05 NOTE — Telephone Encounter (Signed)
[facility] STAT lab calling to report critical results.

## 2022-02-05 NOTE — Telephone Encounter (Signed)
Spoke with patient. Asked her to come by for CBC to check her hemoglobin. Patient said she would get by the office soon for this to be done.

## 2022-02-05 NOTE — Telephone Encounter (Signed)
Spoke with pt and started an Anticoagulation Encounter. Please refer to Anticoagulation Encounter for details.

## 2022-02-06 ENCOUNTER — Other Ambulatory Visit (INDEPENDENT_AMBULATORY_CARE_PROVIDER_SITE_OTHER): Payer: Medicare Other

## 2022-02-06 DIAGNOSIS — D539 Nutritional anemia, unspecified: Secondary | ICD-10-CM | POA: Diagnosis not present

## 2022-02-06 LAB — CBC WITH DIFFERENTIAL/PLATELET
Basophils Absolute: 0 10*3/uL (ref 0.0–0.1)
Basophils Relative: 0.5 % (ref 0.0–3.0)
Eosinophils Absolute: 0.2 10*3/uL (ref 0.0–0.7)
Eosinophils Relative: 3.8 % (ref 0.0–5.0)
HCT: 31.9 % — ABNORMAL LOW (ref 36.0–46.0)
Hemoglobin: 10.5 g/dL — ABNORMAL LOW (ref 12.0–15.0)
Lymphocytes Relative: 8.3 % — ABNORMAL LOW (ref 12.0–46.0)
Lymphs Abs: 0.5 10*3/uL — ABNORMAL LOW (ref 0.7–4.0)
MCHC: 32.8 g/dL (ref 30.0–36.0)
MCV: 95.2 fl (ref 78.0–100.0)
Monocytes Absolute: 0.4 10*3/uL (ref 0.1–1.0)
Monocytes Relative: 6.6 % (ref 3.0–12.0)
Neutro Abs: 4.4 10*3/uL (ref 1.4–7.7)
Neutrophils Relative %: 80.8 % — ABNORMAL HIGH (ref 43.0–77.0)
Platelets: 157 10*3/uL (ref 150.0–400.0)
RBC: 3.35 Mil/uL — ABNORMAL LOW (ref 3.87–5.11)
RDW: 17.5 % — ABNORMAL HIGH (ref 11.5–15.5)
WBC: 5.5 10*3/uL (ref 4.0–10.5)

## 2022-02-10 NOTE — Telephone Encounter (Signed)
Inbound call from patient calling to discuss blood test results. Please give a call to further advise.  Thank you

## 2022-02-11 NOTE — Telephone Encounter (Signed)
Discussed the CBC of 02/06/22. Stable hgb. Recheck in 1 month.

## 2022-02-13 ENCOUNTER — Ambulatory Visit: Payer: Medicare Other | Attending: Cardiovascular Disease

## 2022-02-13 DIAGNOSIS — Z7901 Long term (current) use of anticoagulants: Secondary | ICD-10-CM | POA: Diagnosis not present

## 2022-02-13 DIAGNOSIS — Z952 Presence of prosthetic heart valve: Secondary | ICD-10-CM

## 2022-02-13 LAB — POCT INR: INR: 1.5 — AB (ref 2.0–3.0)

## 2022-02-13 NOTE — Patient Instructions (Signed)
TAKE 2.5 TABLETS TODAY ONLY and 1.5 TABLETS FRIDAY ONLY and then resume taking 2.5 mg daily except for 53m on Sunday, Tuesday, and Thursday. Recheck INR in 1 week. Coumadin Clinic [phone] [phone] Have some leafy veggies today and stay consistent with 3 helpings of greens per week.

## 2022-02-17 LAB — CUP PACEART REMOTE DEVICE CHECK
Battery Remaining Longevity: 39 mo
Battery Voltage: 2.96 V
Brady Statistic AP VP Percent: 86.42 %
Brady Statistic AP VS Percent: 2.12 %
Brady Statistic AS VP Percent: 11.14 %
Brady Statistic AS VS Percent: 0.31 %
Brady Statistic RA Percent Paced: 87.91 %
Brady Statistic RV Percent Paced: 18.22 %
Date Time Interrogation Session: 20230918022704
HighPow Impedance: 70 Ohm
Implantable Lead Implant Date: 20080221
Implantable Lead Implant Date: 20080221
Implantable Lead Implant Date: 20080221
Implantable Lead Location: 753858
Implantable Lead Location: 753859
Implantable Lead Location: 753860
Implantable Lead Model: 4543
Implantable Lead Model: 5076
Implantable Lead Model: 7002
Implantable Lead Serial Number: 134379
Implantable Pulse Generator Implant Date: 20181128
Lead Channel Impedance Value: 323 Ohm
Lead Channel Impedance Value: 342 Ohm
Lead Channel Impedance Value: 399 Ohm
Lead Channel Impedance Value: 399 Ohm
Lead Channel Impedance Value: 437 Ohm
Lead Channel Impedance Value: 703 Ohm
Lead Channel Pacing Threshold Amplitude: 0.75 V
Lead Channel Pacing Threshold Amplitude: 0.875 V
Lead Channel Pacing Threshold Amplitude: 1.125 V
Lead Channel Pacing Threshold Pulse Width: 0.4 ms
Lead Channel Pacing Threshold Pulse Width: 0.4 ms
Lead Channel Pacing Threshold Pulse Width: 0.4 ms
Lead Channel Sensing Intrinsic Amplitude: 2.25 mV
Lead Channel Sensing Intrinsic Amplitude: 2.25 mV
Lead Channel Sensing Intrinsic Amplitude: 28.5 mV
Lead Channel Sensing Intrinsic Amplitude: 28.5 mV
Lead Channel Setting Pacing Amplitude: 1.5 V
Lead Channel Setting Pacing Amplitude: 1.5 V
Lead Channel Setting Pacing Amplitude: 2.25 V
Lead Channel Setting Pacing Pulse Width: 0.4 ms
Lead Channel Setting Pacing Pulse Width: 0.4 ms
Lead Channel Setting Sensing Sensitivity: 1.2 mV

## 2022-02-18 ENCOUNTER — Ambulatory Visit (INDEPENDENT_AMBULATORY_CARE_PROVIDER_SITE_OTHER): Payer: Medicare Other

## 2022-02-18 DIAGNOSIS — I428 Other cardiomyopathies: Secondary | ICD-10-CM

## 2022-02-20 ENCOUNTER — Ambulatory Visit: Payer: Medicare Other | Attending: Cardiovascular Disease

## 2022-02-20 DIAGNOSIS — Z952 Presence of prosthetic heart valve: Secondary | ICD-10-CM

## 2022-02-20 DIAGNOSIS — Z7901 Long term (current) use of anticoagulants: Secondary | ICD-10-CM

## 2022-02-20 LAB — POCT INR: INR: 2.5 (ref 2.0–3.0)

## 2022-02-20 NOTE — Patient Instructions (Signed)
Description   Take 2.5 (6.88m) tablets today and then continue taking 2.5 mg daily except for 582mon Sunday, Tuesday, and Thursday.  Recheck INR in 2 weeks.  Coumadin Clinic [phone] some leafy veggies today and stay consistent with 3 helpings of greens per week.

## 2022-02-26 ENCOUNTER — Telehealth: Payer: Self-pay | Admitting: *Deleted

## 2022-02-26 NOTE — Telephone Encounter (Signed)
   Pre-operative Risk Assessment    Patient Name: [name]  DOB: Aug 16, 1940 MRN: [id number]     Request for Surgical Clearance    Procedure:  Dental Extraction - Amount of Teeth to be Pulled:  8 and Tori Removal  Date of Surgery:  Clearance TBD                                 Surgeon:   Durene Cal DDS,PA Surgeon's Group or Practice Name:  [facility] Phone number:  [phone] Fax number:  [phone]   Type of Clearance Requested:   - Medical  - Pharmacy:  Hold Warfarin (Coumadin) unknown    Type of Anesthesia:  Not Indicated   Additional requests/questions:  Does this patient need antibiotics? Please advise surgeon/provider what medications should be held.  Are there any  contraindications to the use of epinephrine yes or no ? Olin Pia   02/26/2022, 12:02 PM

## 2022-03-03 ENCOUNTER — Telehealth: Payer: Self-pay | Admitting: *Deleted

## 2022-03-03 NOTE — Telephone Encounter (Signed)
Pt has been scheduled for a tele visit, 03/05/22 3:00.  Consent on file / medications reconciled.    Patient Consent for Virtual Visit   [name] has provided verbal consent on 03/03/2022 for a virtual visit (video or telephone).   CONSENT FOR VIRTUAL VISIT FOR:  [name]  By participating in this virtual visit I agree to the following:  I hereby voluntarily request, consent and authorize [facility] and its employed or contracted physicians, physician assistants, nurse practitioners or other licensed health care professionals (the Practitioner), to provide me with telemedicine health care services (the "Services") as deemed necessary by the treating Practitioner. I acknowledge and consent to receive the Services by the Practitioner via telemedicine. I understand that the telemedicine visit will involve communicating with the Practitioner through live audiovisual communication technology and the disclosure of certain medical information by electronic transmission. I acknowledge that I have been given the opportunity to request an in-person assessment or other available alternative prior to the telemedicine visit and am voluntarily participating in the telemedicine visit.  I understand that I have the right to withhold or withdraw my consent to the use of telemedicine in the course of my care at any time, without affecting my right to future care or treatment, and that the Practitioner or I may terminate the telemedicine visit at any time. I understand that I have the right to inspect all information obtained and/or recorded in the course of the telemedicine visit and may receive copies of available information for a reasonable fee.  I understand that some of the potential risks of receiving the Services via telemedicine include:  Delay or interruption in medical evaluation due to technological equipment failure or disruption; Information transmitted may not be  sufficient (e.g. poor resolution of images) to allow for appropriate medical decision making by the Practitioner; and/or  In rare instances, security protocols could fail, causing a breach of personal health information.  Furthermore, I acknowledge that it is my responsibility to provide information about my medical history, conditions and care that is complete and accurate to the best of my ability. I acknowledge that Practitioner's advice, recommendations, and/or decision may be based on factors not within their control, such as incomplete or inaccurate data provided by me or distortions of diagnostic images or specimens that may result from electronic transmissions. I understand that the practice of medicine is not an exact science and that Practitioner makes no warranties or guarantees regarding treatment outcomes. I acknowledge that a copy of this consent can be made available to me via my patient portal ([facility]), or I can request a printed copy by calling the office of [facility].    I understand that my insurance will be billed for this visit.   I have read or had this consent read to me. I understand the contents of this consent, which adequately explains the benefits and risks of the Services being provided via telemedicine.  I have been provided ample opportunity to ask questions regarding this consent and the Services and have had my questions answered to my satisfaction. I give my informed consent for the services to be provided through the use of telemedicine in my medical care

## 2022-03-03 NOTE — Telephone Encounter (Signed)
Pt has been scheduled for a tele visit, 03/05/22 3:00.  Consent on file / medications reconciled.

## 2022-03-03 NOTE — Telephone Encounter (Signed)
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    Procedure: dental extractions (8) and tori removal Date of procedure: TBD  CrCl 23 Platelet count 197  Per office protocol, patient can hold warfarin for 5 days prior to procedure.   Patient WILL need bridging with Lovenox (enoxaparin) around procedure.  INR followed by [facility] - has appt 10/6, they will coordinate.   **This guidance is not considered finalized until pre-operative APP has relayed final recommendations.**

## 2022-03-03 NOTE — Telephone Encounter (Signed)
Primary Cardiologist:Jonathan Gwenlyn Found, MD   Preoperative team, please contact this patient and set up a phone call appointment for further preoperative risk assessment. Please obtain consent and complete medication review. Thank you for your help.   I confirm that guidance regarding antiplatelet and oral anticoagulation therapy has been completed and, if necessary, noted below.  Patient will need SBE prophylaxis for upcoming procedure.   Emmaline Life, NP-C  03/03/2022, 8:55 AM [address], [address] Office [phone] Fax [phone]

## 2022-03-04 NOTE — Progress Notes (Signed)
Remote ICD transmission.   

## 2022-03-05 ENCOUNTER — Ambulatory Visit: Payer: Medicare Other | Attending: Internal Medicine | Admitting: Nurse Practitioner

## 2022-03-05 ENCOUNTER — Encounter: Payer: Self-pay | Admitting: Nurse Practitioner

## 2022-03-05 DIAGNOSIS — Z01810 Encounter for preprocedural cardiovascular examination: Secondary | ICD-10-CM | POA: Diagnosis not present

## 2022-03-05 MED ORDER — AMOXICILLIN 500 MG PO TABS: 2000.0000 mg | ORAL_TABLET | Freq: Once | ORAL | 3 refills | Status: AC

## 2022-03-05 NOTE — Progress Notes (Signed)
Virtual Visit via Telephone Note   Because of [name]'s co-morbid illnesses, she is at least at moderate risk for complications without adequate follow up.  This format is felt to be most appropriate for this patient at this time.  The patient did not have access to video technology/had technical difficulties with video requiring transitioning to audio format only (telephone).  All issues noted in this document were discussed and addressed.  No physical exam could be performed with this format.  Please refer to the patient's chart for her consent to telehealth for [facility].  Evaluation Performed:  Preoperative cardiovascular risk assessment _____________   Date:  03/05/2022   Patient ID:  [name], DOB 10-27-1940, MRN [id number] Patient Location:  Home Provider location:   Office  Primary Care Provider:  [name], MD Primary Cardiologist:  Quay Burow, MD  Chief Complaint / Patient Profile   81 y.o. y/o female with a h/o MVP s/p mitral valve repair in 2002, mitral valve replacement 2007 on coumadin, chronic combined CHF/NICM s/p BiV ICD, PAF, HTN, HLD, CKD stage III, GI bleed who is pending dental extraction x 8 and tori removal and presents today for telephonic preoperative cardiovascular risk assessment.  Most recent echocardiogram May 2023 showed EF 30 to 35% down from 45%, moderately decreased LV function, LV global hypokinesis, indeterminate diastolic parameters, normally functioning mitral valve prosthesis, mild AR, aortic valve sclerosis with no evidence of aortic valve stenosis  Past Medical History    Past Medical History:  Diagnosis Date   Allergy    seasonal   Anemia    Anxiety    Arthritis    Asthma    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 Protecta   Biventricular ICD (implantable cardioverter-defibrillator) in place    with CRT   Blood transfusion without reported diagnosis    Bursitis     Cataract    RIGHT EYE   CHF (congestive heart failure) ([place])    Chronic kidney disease    Colon polyp    adenomatous   Complication of anesthesia    patient stated that had difficulty getting the breathing tube removed, patient said that she stopped breathing and HR dropped to 10  patient then woke up and started breathing pateint stated no longer than one minute; re-intubated in PACU following cholecystectomy 10/28/13   COPD (chronic obstructive pulmonary disease) ([place])    Depression    Diverticulosis    Dysrhythmia    Fibromyalgia    GERD (gastroesophageal reflux disease)    Gout    H/O mitral valve replacement 2002, 2007   Heart murmur    Hemorrhoids    Hyperlipidemia    Hypertension    Hypothyroidism    IBS (irritable bowel syndrome)    MVP (mitral valve prolapse)    Neuromuscular disorder ([place])    fibromyalgia   Pacemaker    Peptic ulcer disease    Past Surgical History:  Procedure Laterality Date   ABDOMINAL HYSTERECTOMY     BIV ICD GENERATOR CHANGEOUT N/A 04/29/2017   Procedure: BIV ICD GENERATOR CHANGEOUT;  Surgeon: Sanda Klein, MD;  Location: [place] [facility];  Service: Cardiovascular;  Laterality: N/A;   CARDIAC CATHETERIZATION  02/25/2006   normal left main, normal LAD, normal L Cfx, normal/dominant RCA (Dr. [name])   CARDIAC DEFIBRILLATOR PLACEMENT  2007, 11/202012   x2 (pacemaker) (Dr. Jerilynn Mages. Croitoru)   CARDIAC VALVE REPLACEMENT  2002   MV repair -  Dr. Berle Mull   CHOLECYSTECTOMY N/A 10/28/2013   Procedure: LAPAROSCOPIC CHOLECYSTECTOMY WITH INTRAOPERATIVE CHOLANGIOGRAM;  Surgeon: Edward Jolly, MD;  Location: [place];  Service: General;  Laterality: N/A;   COLONOSCOPY     ENTEROSCOPY N/A 11/09/2021   Procedure: ENTEROSCOPY;  Surgeon: Rush Landmark Telford Nab., MD;  Location: [place];  Service: Gastroenterology;  Laterality: N/A;   ESOPHAGOGASTRODUODENOSCOPY (EGD) WITH PROPOFOL N/A 03/02/2021   Procedure: ESOPHAGOGASTRODUODENOSCOPY (EGD) WITH  PROPOFOL;  Surgeon: Daryel November, MD;  Location: [place];  Service: Gastroenterology;  Laterality: N/A;   EYE SURGERY     HEMOSTASIS CLIP PLACEMENT  03/02/2021   Procedure: HEMOSTASIS CLIP PLACEMENT;  Surgeon: Daryel November, MD;  Location: [place];  Service: Gastroenterology;;   HEMOSTASIS CLIP PLACEMENT  11/09/2021   Procedure: HEMOSTASIS CLIP PLACEMENT;  Surgeon: Irving Copas., MD;  Location: [place];  Service: Gastroenterology;;   HOT HEMOSTASIS  03/02/2021   Procedure: HOT HEMOSTASIS (ARGON PLASMA COAGULATION/BICAP);  Surgeon: Daryel November, MD;  Location: [address];  Service: Gastroenterology;;   HOT HEMOSTASIS N/A 11/09/2021   Procedure: HOT HEMOSTASIS (ARGON PLASMA COAGULATION/BICAP);  Surgeon: Irving Copas., MD;  Location: [place];  Service: Gastroenterology;  Laterality: N/A;   IMPLANTABLE CARDIOVERTER DEFIBRILLATOR (ICD) GENERATOR CHANGE N/A 04/10/2011   Procedure: ICD GENERATOR CHANGE;  Surgeon: Sanda Klein, MD;  Location: [place] [facility];  Service: Cardiovascular;  Laterality: N/A;   INSERT / REPLACE / REMOVE PACEMAKER     KNEE ARTHROSCOPY     MITRAL VALVE REPLACEMENT  02/26/2006   re-do MVR w/76m St. [name] (Dr. [place]llison Hughs   [place]   persantine myoview - low ris, EF 63%   RIGHT HEART CATH  04/03/2006   pulm cap wedge pressure 24/24, PA pressure 43/22 (mean 352mg), CO 4.8, CI 4.1 (Dr. J.Jackie Plum  SCLEROTHERAPY  03/02/2021   Procedure: SCClide Deutscher Surgeon: CuDaryel NovemberMD;  [place] Service: Gastroenterology;;   SUBMUCOSAL TATTOO INJECTION  11/09/2021   Procedure: SUBMUCOSAL TATTOO INJECTION;  Surgeon: MaIrving Copas MD;  [place] Service: [place]ology;;   TOTAL SHOULDER ARTHROPLASTY Right 12/27/2015   Procedure: RIGHT TOTAL SHOULDER ARTHROPLASTY;  Surgeon: JuTania AdeMD;  [place] Service: [place]ics;  Laterality: Right;  Right  total shoulder arthroplasty   TOTAL SHOULDER ARTHROPLASTY Left 10/27/2019   Procedure: REVERSE TOTAL SHOULDER ARTHROPLASTY;  Surgeon: ChTania AdeMD;  Location: [facility];  Service: Orthopedics;  Laterality: Left;   TRANSTHORACIC ECHOCARDIOGRAM  12/2011   EF 50-55%, mild global hypokinesis; LA severely dilated; calcification of anterior/posterior MV leaflets, bi-leaflets St. [name] mechanical MV; mild TR; trace AV regurg/pulm valve regurg    Allergies  Allergies  Allergen Reactions   Ace Inhibitors Cough   Lipitor [Atorvastatin Calcium] Other (See Comments)    Knee pain    Megace [Megestrol] Other (See Comments)    High blood pressure and blurred vision   Aspirin Other (See Comments)    Nephrologist said to not take this   Clorazepate Dipotassium Other (See Comments)    Interacts with a drug being taken   Simvastatin Other (See Comments)    Muscle pain   Tiotropium Bromide Monohydrate Other (See Comments)    Dry mouth   Codeine Nausea And Vomiting   Tape Rash    History of Present Illness    [name] a [age].o. female who presents via audio/video conferencing for a telehealth visit today.  Pt was last seen in cardiology clinic on 12/04/21 by Dr. Quentin Ore and previously seen by Diona Browner, NP on 11/19/21 at that time [name] was doing well.  The patient is now pending procedure as outlined above. Since her last visit, she  denies chest pain, shortness of breath, lower extremity edema, fatigue, palpitations, melena, hematuria, hemoptysis, diaphoresis, weakness, presyncope, syncope, orthopnea, and PND. She takes furosemide as needed for LE edema. Mobility is limited by back and knee issues and she uses a walker when goes out in public but walks back and forth on her deck and around her home and is still able to achieve > 4 METS activity without s/s of angina.   Home Medications    Prior to Admission medications   Medication Sig Start Date End Date Taking?  Authorizing Provider  acetaminophen (TYLENOL) 650 MG CR tablet Take 650 mg by mouth every 8 (eight) hours as needed for pain.    [provider]  allopurinol (ZYLOPRIM) 300 MG tablet Take 300 mg by mouth every evening.     [provider]  ALPRAZolam Duanne Moron) 0.25 MG tablet Take 0.25 mg by mouth at bedtime as needed for anxiety or sleep.     [provider]  antiseptic oral rinse (BIOTENE) LIQD 15 mLs by Mouth Rinse route as needed for dry mouth.    [provider]  buPROPion (WELLBUTRIN SR) 150 MG 12 hr tablet Take 150 mg by mouth in the morning.    [provider]  esomeprazole (NEXIUM) 40 MG capsule Take 1 capsule (40 mg total) by mouth daily before breakfast. 12/25/21   Esterwood, Amy S, PA-C  fluticasone (FLONASE) 50 MCG/ACT nasal spray Place 1 spray into both nostrils as needed for allergies.    [provider]  furosemide (LASIX) 40 MG tablet TAKE 1 TABLET BY MOUTH DAILY. **NEEDS OV FOR FUTURE REFILLS** Patient not taking: Reported on 03/03/2022 03/21/21   Lorretta Harp, MD  icosapent Ethyl (VASCEPA) 1 g capsule TAKE 2 CAPSULES BY MOUTH 2 TIMES DAILY. 08/29/21   Lorretta Harp, MD  iron polysaccharides (NIFEREX) 150 MG capsule Take 1 capsule (150 mg total) by mouth daily. Patient not taking: Reported on 03/03/2022 11/12/21   Thurnell Lose, MD  levothyroxine (SYNTHROID) 75 MCG tablet Take 75 mcg by mouth daily before breakfast.    [provider]  metoCLOPramide (REGLAN) 5 MG tablet Take 5 mg by mouth 2 (two) times daily as needed for nausea or vomiting. 11/05/21   [provider]  metoprolol succinate (TOPROL-XL) 50 MG 24 hr tablet Take 1 tablet (50 mg total) by mouth 2 (two) times daily. Take with or immediately following a meal. Patient taking differently: Take 75 mg by mouth 2 (two) times daily. Take with or immediately following a meal. 12/04/21   Vickie Epley, MD  Multiple Vitamin (MULTIVITAMIN WITH MINERALS)  TABS tablet Take 1 tablet by mouth daily.    [provider]  phenylephrine (SUDAFED PE) 10 MG TABS tablet Take 10 mg by mouth at bedtime as needed (Congestion).    [provider]  potassium chloride (KLOR-CON M) 10 MEQ tablet Take 1 tablet (10 mEq total) by mouth daily. Patient not taking: Reported on 03/03/2022 11/19/21   Lenna Sciara, NP  pravastatin (PRAVACHOL) 40 MG tablet Take 40 mg at bedtime by mouth.     [provider]  PROAIR HFA 108 (90 BASE) MCG/ACT inhaler Inhale 1-2 puffs into the lungs every 6 (  six) hours as needed for wheezing. 01/23/15   [provider]  Sennosides (SENNA) 15 MG TABS Take 30 mg by mouth at bedtime.    [provider]  spironolactone (ALDACTONE) 25 MG tablet Take 1 tablet (25 mg total) by mouth daily. 08/19/21   Lorretta Harp, MD  VOLTAREN 1 % GEL Apply 2-4 g topically in the morning and at bedtime.    [provider]  warfarin (COUMADIN) 2.5 MG tablet Take 1-2 tablets (2.5-5 mg total) by mouth See admin instructions. Take 1 tablet (2.5 mg) on Monday, Wednesday, Friday Then take 2 tablets (5 mg) on Sunday, Tuesday, Thursday and Saturday or as directed by coumadin clinic 11/12/21   Thurnell Lose, MD    Physical Exam    Vital Signs:  [name] does not have vital signs available for review today.  Given telephonic nature of communication, physical exam is limited. AAOx3. NAD. Normal affect.  Speech and respirations are unlabored.  Accessory Clinical Findings    None  Assessment & Plan    1.  Preoperative Cardiovascular Risk Assessment: The patient is doing well from a cardiac perspective. Therefore, based on ACC/AHA guidelines, the patient would be at acceptable risk for the planned procedure without further cardiovascular testing. According to the Revised Cardiac Risk Index (RCRI), her Perioperative Risk of Major Cardiac Event is (%): 0.9 Her Functional Capacity in METs is: 4.4 according  to the Duke Activity Status Index (DASI).  The patient was advised that if she develops new symptoms prior to surgery to contact our office to arrange for a follow-up visit, and she verbalized understanding.  Per office protocol, patient can hold warfarin for 5 days prior to procedure.   Patient WILL need bridging with Lovenox (enoxaparin) around procedure.   INR followed by [facility] - has appt 10/6, they will coordinate. She verbalized agreement and understanding.   Patient will need SBE prophylaxis for upcoming procedure. Amoxicillin Rx has been sent to pharmacy.   A copy of this note will be routed to requesting surgeon.  Time:   Today, I have spent 10 minutes with the patient with telehealth technology discussing medical history, symptoms, and management plan.     Emmaline Life, NP-C  03/05/2022, 2:46 PM [address] Office [phone] Fax [phone]

## 2022-03-06 ENCOUNTER — Other Ambulatory Visit: Payer: Self-pay

## 2022-03-06 DIAGNOSIS — D539 Nutritional anemia, unspecified: Secondary | ICD-10-CM

## 2022-03-07 ENCOUNTER — Other Ambulatory Visit (INDEPENDENT_AMBULATORY_CARE_PROVIDER_SITE_OTHER): Payer: Medicare Other

## 2022-03-07 ENCOUNTER — Other Ambulatory Visit: Payer: Self-pay

## 2022-03-07 ENCOUNTER — Telehealth: Payer: Self-pay | Admitting: Cardiovascular Disease

## 2022-03-07 ENCOUNTER — Ambulatory Visit: Payer: Medicare Other | Attending: Cardiovascular Disease

## 2022-03-07 DIAGNOSIS — D539 Nutritional anemia, unspecified: Secondary | ICD-10-CM

## 2022-03-07 DIAGNOSIS — Z7901 Long term (current) use of anticoagulants: Secondary | ICD-10-CM | POA: Diagnosis not present

## 2022-03-07 DIAGNOSIS — Z952 Presence of prosthetic heart valve: Secondary | ICD-10-CM

## 2022-03-07 LAB — CBC WITH DIFFERENTIAL/PLATELET
Basophils Absolute: 0.1 10*3/uL (ref 0.0–0.1)
Basophils Relative: 1.1 % (ref 0.0–3.0)
Eosinophils Absolute: 0.2 10*3/uL (ref 0.0–0.7)
Eosinophils Relative: 4.5 % (ref 0.0–5.0)
HCT: 34.1 % — ABNORMAL LOW (ref 36.0–46.0)
Hemoglobin: 11.2 g/dL — ABNORMAL LOW (ref 12.0–15.0)
Lymphocytes Relative: 9.9 % — ABNORMAL LOW (ref 12.0–46.0)
Lymphs Abs: 0.5 10*3/uL — ABNORMAL LOW (ref 0.7–4.0)
MCHC: 32.8 g/dL (ref 30.0–36.0)
MCV: 94.6 fl (ref 78.0–100.0)
Monocytes Absolute: 0.4 10*3/uL (ref 0.1–1.0)
Monocytes Relative: 8.1 % (ref 3.0–12.0)
Neutro Abs: 4 10*3/uL (ref 1.4–7.7)
Neutrophils Relative %: 76.4 % (ref 43.0–77.0)
Platelets: 170 10*3/uL (ref 150.0–400.0)
RBC: 3.61 Mil/uL — ABNORMAL LOW (ref 3.87–5.11)
RDW: 15.2 % (ref 11.5–15.5)
WBC: 5.3 10*3/uL (ref 4.0–10.5)

## 2022-03-07 LAB — POCT INR: INR: 1.8 — AB (ref 2.0–3.0)

## 2022-03-07 MED ORDER — ENOXAPARIN SODIUM 40 MG/0.4ML IJ SOSY: 40.0000 mg | PREFILLED_SYRINGE | Freq: Two times a day (BID) | INTRAMUSCULAR | 1 refills | Status: DC

## 2022-03-07 NOTE — Patient Instructions (Signed)
Take 2  tablets today and then continue taking 1 tablet daily except for 2 tablets on Sunday, Tuesday, and Thursday.  Recheck INR in 1 week.  Coumadin Clinic [phone]. Have some leafy veggies today and stay consistent with 3 helpings of greens per week.

## 2022-03-07 NOTE — Telephone Encounter (Signed)
Patient calling to speak with Legrand Como after seeing him today.

## 2022-03-12 NOTE — Addendum Note (Signed)
Addended by: Virgina Evener A on: 03/12/2022 03:54 PM   Modules accepted: Orders

## 2022-03-14 ENCOUNTER — Ambulatory Visit: Payer: Medicare Other | Attending: Cardiovascular Disease | Admitting: *Deleted

## 2022-03-14 DIAGNOSIS — Z7901 Long term (current) use of anticoagulants: Secondary | ICD-10-CM | POA: Diagnosis not present

## 2022-03-14 DIAGNOSIS — Z952 Presence of prosthetic heart valve: Secondary | ICD-10-CM | POA: Diagnosis not present

## 2022-03-14 LAB — POCT INR: INR: 2.4 (ref 2.0–3.0)

## 2022-03-14 NOTE — Patient Instructions (Addendum)
03/14/22: No warfarin or enoxaparin (Lovenox).  03/15/22: Inject enoxaparin 37m in the fatty abdominal tissue at least 2 inches from the belly button twice a day about 12 hours apart, 8am and 8pm rotate sites. No warfarin.  03/16/22: Inject enoxaparin in the fatty tissue every 12 hours, 8am and 8pm. No warfarin.  03/17/22: Inject enoxaparin in the fatty tissue every 12 hours, 8am and 8pm. No warfarin.  03/18/22: Inject enoxaparin in the fatty tissue in the morning at 8 am (No PM dose). No warfarin.  03/19/22: Procedure Day - No enoxaparin - Resume warfarin in the evening or as directed by doctor (resume normal dose).  03/20/22: Resume enoxaparin inject in the fatty tissue every 12 hours and take warfarin  03/21/22: Inject enoxaparin in the fatty tissue every 12 hours and take warfarin  03/22/22: Inject enoxaparin in the fatty tissue every 12 hours and take warfarin  03/23/22: Inject enoxaparin in the fatty tissue every 12 hours and take warfarin  03/24/22: Inject enoxaparin in the fatty tissue every 12 hours and take warfarin  03/25/22:  Inject enoxaparin in the fatty tissue at 8am and report warfarin appt to check INR.

## 2022-03-19 ENCOUNTER — Telehealth: Payer: Self-pay | Admitting: *Deleted

## 2022-03-19 NOTE — Telephone Encounter (Signed)
Husband left a voicemail and stated the oral surgeon/dentist wants pt to take ibuprofen post her extractions and to call them back. Called them back and he explained ibuprofen 833m was given. Advised that tylenol is safe to use and ibuprofen is not recommended while taking warfarin. However, since her dentist advised for the post dental pain she needs to use. Advised to use cautiously and that this will assist in alleviating the acute pain and they verbalized understanding. Husband states the pt goes back to the dentist tomorrow and will see if they change the prescription and will update u[place]oreaif needed.

## 2022-03-20 ENCOUNTER — Other Ambulatory Visit: Payer: Self-pay | Admitting: Cardiovascular Disease

## 2022-03-21 NOTE — Telephone Encounter (Signed)
Prescription refill request received for warfarin Lov: [place], 03/05/2022 Next INR check: 10/24 Warfarin tablet strength: 2.56m

## 2022-03-25 ENCOUNTER — Ambulatory Visit: Payer: Medicare Other | Attending: Cardiovascular Disease | Admitting: *Deleted

## 2022-03-25 DIAGNOSIS — Z952 Presence of prosthetic heart valve: Secondary | ICD-10-CM | POA: Diagnosis not present

## 2022-03-25 DIAGNOSIS — Z7901 Long term (current) use of anticoagulants: Secondary | ICD-10-CM | POA: Diagnosis not present

## 2022-03-25 LAB — POCT INR: INR: 4.9 — AB (ref 2.0–3.0)

## 2022-03-25 NOTE — Patient Instructions (Addendum)
Description   Stop Lovenox Injections. Do not take any warfarin today and take 1/2 tablet of warfarin tomorrow then continue taking 1 tablet daily except for 2 tablets on Sunday, Tuesday, and Thursday.  Recheck INR in 1 week. Coumadin Clinic [phone].  Have some leafy veggies today and stay consistent with 3 helpings of greens per week.

## 2022-03-31 ENCOUNTER — Ambulatory Visit: Payer: Medicare Other | Attending: Cardiovascular Disease

## 2022-03-31 DIAGNOSIS — Z7901 Long term (current) use of anticoagulants: Secondary | ICD-10-CM | POA: Diagnosis not present

## 2022-03-31 DIAGNOSIS — Z952 Presence of prosthetic heart valve: Secondary | ICD-10-CM | POA: Diagnosis not present

## 2022-03-31 LAB — POCT INR: INR: 3.7 — AB (ref 2.0–3.0)

## 2022-03-31 NOTE — Patient Instructions (Signed)
Description   Hold today's dose and then START taking 1 tablet daily except for 2 tablets on Sundays and Tuesdays.   Recheck INR in 1 week. Coumadin Clinic [phone].  Have some leafy veggies today and stay consistent with 3 helpings of greens per week.

## 2022-04-01 ENCOUNTER — Ambulatory Visit: Payer: Medicare Other

## 2022-04-07 ENCOUNTER — Other Ambulatory Visit (INDEPENDENT_AMBULATORY_CARE_PROVIDER_SITE_OTHER): Payer: Medicare Other

## 2022-04-07 DIAGNOSIS — D539 Nutritional anemia, unspecified: Secondary | ICD-10-CM | POA: Diagnosis not present

## 2022-04-07 LAB — CBC WITH DIFFERENTIAL/PLATELET
Basophils Absolute: 0 10*3/uL (ref 0.0–0.1)
Basophils Relative: 0.8 % (ref 0.0–3.0)
Eosinophils Absolute: 0.2 10*3/uL (ref 0.0–0.7)
Eosinophils Relative: 3.6 % (ref 0.0–5.0)
HCT: 31.9 % — ABNORMAL LOW (ref 36.0–46.0)
Hemoglobin: 10.6 g/dL — ABNORMAL LOW (ref 12.0–15.0)
Lymphocytes Relative: 9.5 % — ABNORMAL LOW (ref 12.0–46.0)
Lymphs Abs: 0.6 10*3/uL — ABNORMAL LOW (ref 0.7–4.0)
MCHC: 33.2 g/dL (ref 30.0–36.0)
MCV: 94.5 fl (ref 78.0–100.0)
Monocytes Absolute: 0.6 10*3/uL (ref 0.1–1.0)
Monocytes Relative: 8.6 % (ref 3.0–12.0)
Neutro Abs: 5 10*3/uL (ref 1.4–7.7)
Neutrophils Relative %: 77.5 % — ABNORMAL HIGH (ref 43.0–77.0)
Platelets: 190 10*3/uL (ref 150.0–400.0)
RBC: 3.38 Mil/uL — ABNORMAL LOW (ref 3.87–5.11)
RDW: 15.1 % (ref 11.5–15.5)
WBC: 6.5 10*3/uL (ref 4.0–10.5)

## 2022-04-08 ENCOUNTER — Ambulatory Visit: Payer: Medicare Other | Attending: Internal Medicine | Admitting: *Deleted

## 2022-04-08 DIAGNOSIS — Z7901 Long term (current) use of anticoagulants: Secondary | ICD-10-CM | POA: Diagnosis not present

## 2022-04-08 DIAGNOSIS — Z952 Presence of prosthetic heart valve: Secondary | ICD-10-CM | POA: Diagnosis not present

## 2022-04-08 LAB — POCT INR: INR: 1.6 — AB (ref 2.0–3.0)

## 2022-04-08 NOTE — Patient Instructions (Signed)
Description   Today take 2.5 tablets and tomorrow take 1.5 tablets then continue taking 1 tablet daily except for 2 tablets on Sundays and Tuesdays.  Recheck INR in 1 week. Coumadin Clinic [phone]. Stay consistent with 3 helpings of greens per week.

## 2022-04-17 ENCOUNTER — Ambulatory Visit: Payer: Medicare Other | Attending: Cardiology | Admitting: *Deleted

## 2022-04-17 DIAGNOSIS — Z952 Presence of prosthetic heart valve: Secondary | ICD-10-CM | POA: Diagnosis not present

## 2022-04-17 DIAGNOSIS — Z7901 Long term (current) use of anticoagulants: Secondary | ICD-10-CM | POA: Diagnosis not present

## 2022-04-17 LAB — POCT INR: INR: 1.8 — AB (ref 2.0–3.0)

## 2022-04-17 NOTE — Patient Instructions (Addendum)
Description   Today take 2 tablets and then start taking 1 tablet daily except for 2 tablets on Sundays,  Tuesdays, and Thursdays.  Recheck INR in 1 week. Coumadin Clinic [phone]. Stay consistent with 3 helpings of greens per week.    Fax dental clearance form to [phone] or [phone]

## 2022-04-22 ENCOUNTER — Ambulatory Visit: Payer: Medicare Other | Attending: Internal Medicine

## 2022-04-22 DIAGNOSIS — Z7901 Long term (current) use of anticoagulants: Secondary | ICD-10-CM

## 2022-04-22 DIAGNOSIS — Z952 Presence of prosthetic heart valve: Secondary | ICD-10-CM

## 2022-04-22 LAB — POCT INR: INR: 1.7 — AB (ref 2.0–3.0)

## 2022-04-22 NOTE — Patient Instructions (Signed)
Today take 3 tablets and then continue 1 tablet daily except for 2 tablets on Sundays,  Tuesdays, and Thursdays.  Recheck INR in 2 weeks. Coumadin Clinic [phone]. Stay consistent with 3 helpings of greens per week.

## 2022-05-06 ENCOUNTER — Other Ambulatory Visit: Payer: Self-pay

## 2022-05-06 ENCOUNTER — Telehealth: Payer: Self-pay

## 2022-05-06 ENCOUNTER — Ambulatory Visit: Payer: Medicare Other | Attending: Cardiovascular Disease

## 2022-05-06 DIAGNOSIS — Z5181 Encounter for therapeutic drug level monitoring: Secondary | ICD-10-CM

## 2022-05-06 DIAGNOSIS — Z952 Presence of prosthetic heart valve: Secondary | ICD-10-CM

## 2022-05-06 DIAGNOSIS — Z7901 Long term (current) use of anticoagulants: Secondary | ICD-10-CM | POA: Diagnosis not present

## 2022-05-06 DIAGNOSIS — D539 Nutritional anemia, unspecified: Secondary | ICD-10-CM

## 2022-05-06 LAB — POCT INR: INR: 1.6 — AB (ref 2.0–3.0)

## 2022-05-06 NOTE — Telephone Encounter (Signed)
Left a message for the patient to remind her to come in for her labs. She is due CBC with diff.

## 2022-05-06 NOTE — Patient Instructions (Signed)
Today take 3 tablets and then INCREASE TO  2 tablets daily except for 1 tablet on Monday, Wednesday and Friday.  Recheck INR in 2 weeks. Coumadin Clinic [phone]. Stay consistent with 1 helping of greens per week.

## 2022-05-08 ENCOUNTER — Other Ambulatory Visit (INDEPENDENT_AMBULATORY_CARE_PROVIDER_SITE_OTHER): Payer: Medicare Other

## 2022-05-08 DIAGNOSIS — D539 Nutritional anemia, unspecified: Secondary | ICD-10-CM | POA: Diagnosis not present

## 2022-05-08 LAB — CBC WITH DIFFERENTIAL/PLATELET
Basophils Absolute: 0 10*3/uL (ref 0.0–0.1)
Basophils Relative: 0.4 % (ref 0.0–3.0)
Eosinophils Absolute: 0.2 10*3/uL (ref 0.0–0.7)
Eosinophils Relative: 2.9 % (ref 0.0–5.0)
HCT: 31.7 % — ABNORMAL LOW (ref 36.0–46.0)
Hemoglobin: 10.7 g/dL — ABNORMAL LOW (ref 12.0–15.0)
Lymphocytes Relative: 6.6 % — ABNORMAL LOW (ref 12.0–46.0)
Lymphs Abs: 0.5 10*3/uL — ABNORMAL LOW (ref 0.7–4.0)
MCHC: 33.7 g/dL (ref 30.0–36.0)
MCV: 93.4 fl (ref 78.0–100.0)
Monocytes Absolute: 0.5 10*3/uL (ref 0.1–1.0)
Monocytes Relative: 6.6 % (ref 3.0–12.0)
Neutro Abs: 6.2 10*3/uL (ref 1.4–7.7)
Neutrophils Relative %: 83.5 % — ABNORMAL HIGH (ref 43.0–77.0)
Platelets: 173 10*3/uL (ref 150.0–400.0)
RBC: 3.39 Mil/uL — ABNORMAL LOW (ref 3.87–5.11)
RDW: 15 % (ref 11.5–15.5)
WBC: 7.4 10*3/uL (ref 4.0–10.5)

## 2022-05-15 ENCOUNTER — Other Ambulatory Visit: Payer: Self-pay

## 2022-05-15 ENCOUNTER — Telehealth: Payer: Self-pay | Admitting: Physician Assistant

## 2022-05-15 DIAGNOSIS — D539 Nutritional anemia, unspecified: Secondary | ICD-10-CM

## 2022-05-15 NOTE — Telephone Encounter (Signed)
Patient is calling seeking lab results from last week, also states she has had either constipation or diarrhea all week. Please advise

## 2022-05-19 ENCOUNTER — Ambulatory Visit (INDEPENDENT_AMBULATORY_CARE_PROVIDER_SITE_OTHER): Payer: Medicare Other

## 2022-05-19 ENCOUNTER — Encounter: Payer: Self-pay | Admitting: Cardiovascular Disease

## 2022-05-19 ENCOUNTER — Ambulatory Visit: Payer: Medicare Other | Attending: Cardiovascular Disease | Admitting: Cardiovascular Disease

## 2022-05-19 VITALS — BP 138/60 | HR 70 | Ht 62.0 in | Wt 99.8 lb

## 2022-05-19 DIAGNOSIS — Z95810 Presence of automatic (implantable) cardiac defibrillator: Secondary | ICD-10-CM

## 2022-05-19 DIAGNOSIS — D6869 Other thrombophilia: Secondary | ICD-10-CM | POA: Diagnosis not present

## 2022-05-19 DIAGNOSIS — I5042 Chronic combined systolic (congestive) and diastolic (congestive) heart failure: Secondary | ICD-10-CM | POA: Diagnosis not present

## 2022-05-19 DIAGNOSIS — I498 Other specified cardiac arrhythmias: Secondary | ICD-10-CM

## 2022-05-19 DIAGNOSIS — Z952 Presence of prosthetic heart valve: Secondary | ICD-10-CM

## 2022-05-19 DIAGNOSIS — Z7901 Long term (current) use of anticoagulants: Secondary | ICD-10-CM

## 2022-05-19 DIAGNOSIS — E782 Mixed hyperlipidemia: Secondary | ICD-10-CM

## 2022-05-19 DIAGNOSIS — I480 Paroxysmal atrial fibrillation: Secondary | ICD-10-CM

## 2022-05-19 DIAGNOSIS — N1832 Chronic kidney disease, stage 3b: Secondary | ICD-10-CM

## 2022-05-19 LAB — POCT INR: INR: 4 — AB (ref 2.0–3.0)

## 2022-05-19 NOTE — Telephone Encounter (Signed)
Patient is aware of her results. Patient had a "stomach bug" that was passed around the family. Everyone is better now.

## 2022-05-19 NOTE — Progress Notes (Signed)
Cardiology office note    Date:  05/19/2022   ID:  [name], [place] 1940-11-22, MRN [id number]  PCP:  [name], MD  Cardiologist: Quay Burow, MD  Electrophysiologist:  Vickie Epley, MD   Evaluation Performed:  Follow-Up Visit  Chief Complaint:  ICD follow up  History of Present Illness:    [name] is a 81 y.o. female with  mechanical mitral valve replacement (repair 2002, replacement 2007,  biventricular pacemaker/defibrillator  (Medtronic [place], generator changeout in November 2018) and chronic heart failure.    She is doing well from a cardiovascular standpoint and has no complaints of shortness of breath, palpitations, dizziness or syncope.  She did have some issues with diarrhea last week.  She is no longer taking any furosemide.  Her echocardiogram showed worsening left ventricular systolic function with an EF that is down to 30-35%.  The cause of this was not immediately clear, but interrogation of her biventricular device does show reduction in effective biventricular pacing from 93% to only 89%. When she saw Dr. Quentin Ore in EP consultation in July he increased her dose of beta-blocker to help suppress the episodes of ventricular sensed beats, mostly due to periods of junctional rhythm.  He saw her correlation between her periods of anemia and increasing frequency of this arrhythmia, with less biventricular pacing.  Substantial improvement over the last 3 months.  Over the last couple of months she has essentially 100% a paced-V paced rhythm.  Her histograms are very flat.  We turned rate response on today.  She remains extremely lean/undernourished, currently with a BMI just over 18.  Pacemaker interrogation shows normal device function.  Generator longevity is estimated at 2.8 years.  She has 92% atrial pacing and 97.2% effective biventricular pacing, both of which are substantially higher compared with historical trends.  She has not had  any ventricular tachycardia or atrial fibrillation in the last 3 months.  Her thoracic impedance/OptiVol has been elevated since the beginning of November but is trending 2 words improvement and is almost back to baseline.  In early 2020 she began developing" noise" on the right ventricular pace-sense channel.  She had recovered left ventricular systolic function to an ejection fraction of 50-55% and she has never received tachycardia therapies.  Ventricular arrhythmia detection and therapies are turned off.  Echo performed in March 2022 shows stable left ventricular ejection fraction of 45-50%.  The mean mitral valve gradient is 4 mmHg at a heart rate of 86 bpm which is also stable. Echo repeated 10/30/2021 shows reduced LVEF down to 30-35%.  This is due to global hypokinesis.  Mitral valve prosthetic function is normal.  Mean gradient 3 mmHg at 70 bpm.   Unfortunately, [name] has lost both her sons to substance abuse at the age of 79.  She is raising her 33 year old grandson, since both of his parents died by overdose.  She has a great relationship with her other grandchild a 3 year old granddaughter who is living independently.  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 Left ventricular ejection fraction which has been as low as 10% improved substantially after implantation of the CRT-D device and now her LVEF is near normal. She received her initial CRT-D in 2008 and underwent a generator change out in 2012 and 2019. She has not received VT therapy. She has a Riata 7 [place]  ICD lead under advisory, which appeared fluoroscopically normal at the time of last generator change.  Did not have any angiographic CAD prior to her surgery.     The atrial lead is a Medtronic [id number] serial number [id number] implanted in July 23, 2006 [name]) The right  ventricular lead is a Chemical engineer 7002 serial number [id number] implanted same date The left ventricular lead is a Guidant BS CI 4543 LV lead, serial [id number] implanted same date She had redundant Medtronic left ventricular epicardial pacing leads implanted at the time of surgery in 2007.   Past Medical History:  Diagnosis Date   Allergy    seasonal   Anemia    Anxiety    Arthritis    Asthma    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 Protecta   Biventricular ICD (implantable cardioverter-defibrillator) in place    with CRT   Blood transfusion without reported diagnosis    Bursitis    Cataract    RIGHT EYE   CHF (congestive heart failure) ([place])    Chronic kidney disease    Colon polyp    adenomatous   Complication of anesthesia    patient stated that had difficulty getting the breathing tube removed, patient said that she stopped breathing and HR dropped to 10  patient then woke up and started breathing pateint stated no longer than one minute; re-intubated in PACU following cholecystectomy 10/28/13   COPD (chronic obstructive pulmonary disease) ([place])    Depression    Diverticulosis    Dysrhythmia    Fibromyalgia    GERD (gastroesophageal reflux disease)    Gout    H/O mitral valve replacement 2002, 2007   Heart murmur    Hemorrhoids    Hyperlipidemia    Hypertension    Hypothyroidism    IBS (irritable bowel syndrome)    MVP (mitral valve prolapse)    Neuromuscular disorder ([place])    fibromyalgia   Pacemaker    Peptic ulcer disease    Past Surgical History:  Procedure Laterality Date   ABDOMINAL HYSTERECTOMY     BIV ICD GENERATOR CHANGEOUT N/A 04/29/2017   Procedure: BIV ICD GENERATOR CHANGEOUT;  Surgeon: Sanda Klein, MD;  Location: [facility];  Service: Cardiovascular;  Laterality: N/A;   CARDIAC CATHETERIZATION  02/25/2006   normal left main, normal LAD, normal L Cfx, normal/dominant RCA (Dr. [name])   [place]  2007, 11/202012   x2 (pacemaker) (Dr. Jerilynn Mages. Davidson Palmieri)   [place]  2002   MV repair - Dr. Berle Mull   CHOLECYSTECTOMY N/A 10/28/2013   Procedure: LAPAROSCOPIC CHOLECYSTECTOMY WITH INTRAOPERATIVE CHOLANGIOGRAM;  Surgeon: Edward Jolly, MD;  Location: [facility];  Service: General;  Laterality: N/A;   COLONOSCOPY     ENTEROSCOPY N/A 11/09/2021   Procedure: ENTEROSCOPY;  Surgeon: Irving Copas., MD;  Location: [facility];  Service: Gastroenterology;  Laterality: N/A;   ESOPHAGOGASTRODUODENOSCOPY (EGD) WITH PROPOFOL N/A 03/02/2021   Procedure: ESOPHAGOGASTRODUODENOSCOPY (EGD) WITH PROPOFOL;  Surgeon: Daryel November, MD;  Location: [facility];  Service: Gastroenterology;  Laterality: N/A;   EYE SURGERY     HEMOSTASIS CLIP PLACEMENT  03/02/2021   Procedure: HEMOSTASIS CLIP PLACEMENT;  Surgeon: Daryel November, MD;  Location: [facility];  Service: Gastroenterology;;   HEMOSTASIS CLIP PLACEMENT  11/09/2021   Procedure: HEMOSTASIS CLIP PLACEMENT;  Surgeon: Irving Copas., MD;  Location: [facility];  Service: Gastroenterology;;   HOT HEMOSTASIS  03/02/2021  Procedure: HOT HEMOSTASIS (ARGON PLASMA COAGULATION/BICAP);  Surgeon: Daryel November, MD;  Location: [facility];  Service: Gastroenterology;;   HOT HEMOSTASIS N/A 11/09/2021   Procedure: HOT HEMOSTASIS (ARGON PLASMA COAGULATION/BICAP);  Surgeon: Irving Copas., MD;  Location: [place];  Service: Gastroenterology;  Laterality: N/A;   IMPLANTABLE CARDIOVERTER DEFIBRILLATOR (ICD) GENERATOR CHANGE N/A 04/10/2011   Procedure: ICD GENERATOR CHANGE;  Surgeon: Sanda Klein, MD;  Location: [facility];  Service: Cardiovascular;  Laterality: N/A;   INSERT / REPLACE / REMOVE PACEMAKER     KNEE ARTHROSCOPY     MITRAL VALVE REPLACEMENT  02/26/2006   re-do MVR w/89m St. [name] (Dr. BEllison Hughs   [place] 2005   persantine myoview - low ris, EF 63%    RIGHT HEART CATH  04/03/2006   pulm cap wedge pressure 24/24, PA pressure 43/22 (mean 375mg), CO 4.8, CI 4.1 (Dr. J.Jackie Plum  SCLEROTHERAPY  03/02/2021   Procedure: SCClide Deutscher Surgeon: CuDaryel NovemberMD;  [facility] Service: Gastroenterology;;   SUBMUCOSAL TATTOO INJECTION  11/09/2021   Procedure: SUBMUCOSAL TATTOO INJECTION;  Surgeon: MaIrving Copas MD;  [facility] Service: Gastro[facility]ology;;   TOTAL SHOULDER ARTHROPLASTY Right 12/27/2015   Procedure: RIGHT TOTAL SHOULDER ARTHROPLASTY;  Surgeon: JuTania AdeMD;  [facility] Service: Orthopedics;  Laterality: Right;  Right total shoulder arthroplasty   TOTAL SHOULDER ARTHROPLASTY Left 10/27/2019   Procedure: REVERSE TOTAL SHOULDER ARTHROPLASTY;  Surgeon: ChTania AdeMD;  Location[facility];  Service: Orthopedics;  Laterality: Left;   TRANSTHORACIC ECHOCARDIOGRAM  12/2011   EF 50-55%, mild global hypokinesis; LA severely dilated; calcification of anterior/posterior MV leaflets, bi-leaflets St. [name] mechanical MV; mild TR; trace AV regurg/pulm valve regurg     Current Meds  Medication Sig   acetaminophen (TYLENOL) 650 MG CR tablet Take 650 mg by mouth every 8 (eight) hours as needed for pain.   allopurinol (ZYLOPRIM) 300 MG tablet Take 300 mg by mouth every evening.    ALPRAZolam (XANAX) 0.25 MG tablet Take 0.25 mg by mouth at bedtime as needed for anxiety or sleep.    antiseptic oral rinse (BIOTENE) LIQD 15 mLs by Mouth Rinse route as needed for dry mouth.   buPROPion (WELLBUTRIN SR) 150 MG 12 hr tablet Take 150 mg by mouth in the morning.   esomeprazole (NEXIUM) 40 MG capsule Take 1 capsule (40 mg total) by mouth daily before breakfast.   fluticasone (FLONASE) 50 MCG/ACT nasal spray Place 1 spray into both nostrils as needed for allergies.   icosapent Ethyl (VASCEPA) 1 g capsule TAKE 2 CAPSULES BY MOUTH 2 TIMES DAILY.   levothyroxine (SYNTHROID) 75 MCG tablet Take 75 mcg by mouth  daily before breakfast.   memantine (NAMENDA) 5 MG tablet Take 5 mg by mouth every morning.   metoCLOPramide (REGLAN) 5 MG tablet Take 5 mg by mouth 2 (two) times daily as needed for nausea or vomiting.   metoprolol succinate (TOPROL-XL) 50 MG 24 hr tablet Take 1 tablet (50 mg total) by mouth 2 (two) times daily. Take with or immediately following a meal. (Patient taking differently: Take 75 mg by mouth 2 (two) times daily. Take with or immediately following a meal.)   Multiple Vitamin (MULTIVITAMIN WITH MINERALS) TABS tablet Take 1 tablet by mouth daily.   phenylephrine (SUDAFED PE) 10 MG TABS tablet Take 10 mg by mouth at bedtime as needed (Congestion).   pravastatin (PRAVACHOL) 40 MG tablet Take 40  mg at bedtime by mouth.    PROAIR HFA 108 (90 BASE) MCG/ACT inhaler Inhale 1-2 puffs into the lungs every 6 (six) hours as needed for wheezing.   Sennosides (SENNA) 15 MG TABS Take 30 mg by mouth at bedtime.   VOLTAREN 1 % GEL Apply 2-4 g topically in the morning and at bedtime.   warfarin (COUMADIN) 2.5 MG tablet Take 1 to 2 tablets by mouth daily as directed by the coumadin clinic   Current Facility-Administered Medications for the 05/19/22 encounter (Office Visit) with Rayonna Heldman, Dani Gobble, MD  Medication   0.9 %  sodium chloride infusion     Allergies:   Ace inhibitors, Lipitor [atorvastatin calcium], Megace [megestrol], Aspirin, Clorazepate dipotassium, Simvastatin, Tiotropium bromide monohydrate, Codeine, and Tape   Social History   Tobacco Use   Smoking status: Former    Types: Cigarettes    Quit date: 06/02/2001    Years since quitting: 20.9   Smokeless tobacco: Never  Vaping Use   Vaping Use: Never used  Substance Use Topics   Alcohol use: Yes    Alcohol/week: 0.0 standard drinks of alcohol    Comment: social-1 every 6 months   Drug use: No     Family Hx: The patient's family history includes Breast cancer in her sister; Crohn's disease in her sister; Heart attack in her father  and mother; Heart disease in her father, mother, sister, and son; Kidney disease in her father; Uterine cancer in her sister. There is no history of Colon cancer or Stomach cancer.  ROS:   Please see the history of present illness.    All other systems are reviewed and are negative.  Prior CV studies:   The following studies were reviewed today: 10/30/2021 ECHO  1. Left ventricular ejection fraction, by estimation, is 30 to 35%. The  left ventricle has moderately decreased function. The left ventricle  demonstrates global hypokinesis. Left ventricular diastolic parameters are  indeterminate.   2. Right ventricular systolic function is normal. The right ventricular  size is normal. Tricuspid regurgitation signal is inadequate for assessing  PA pressure.   3. Left atrial size was mildly dilated.   4. The mitral valve has been repaired/replaced. Trivial mitral valve  regurgitation. The mean mitral valve gradient is 3.0 mmHg with average  heart rate of 70 bpm. There is a 25 mm St. [name] mechanical valve present  in the mitral position. Procedure Date:  02/26/2006.   5. The aortic valve is tricuspid. Aortic valve regurgitation is mild.  Aortic valve sclerosis is present, with no evidence of aortic valve  stenosis.   6. The inferior vena cava is normal in size with greater than 50%  respiratory variability, suggesting right atrial pressure of 3 mmHg.        Labs/Other Tests and Data Reviewed:    EKG: Cardiac electrogram shows atrial paced-ventricular paced rhythm.  ECG shows atrial paced-biventricular paced rhythm with a very prominent distinct positive R wave in lead V1.  QTc 514 ms. Recent Labs: 11/12/2021: ALT 11; Magnesium 1.9 12/26/2021: BUN 26; Creatinine, Ser 1.43; Potassium 4.6; Sodium 139 05/08/2022: Hemoglobin 10.7; Platelets 173.0   03/03/2022 creatinine 1.35 12/19/2021 hemoglobin 10.7  Recent Lipid Panel Lab Results  Component Value Date/Time   CHOL 198 11/23/2020 10:07 AM    TRIG 363 (H) 11/23/2020 10:07 AM   HDL 27 (L) 11/23/2020 10:07 AM   CHOLHDL 7.3 (H) 11/23/2020 10:07 AM   CHOLHDL 8.2 12/09/2006 04:10 AM   LDLCALC 108 (H) 11/23/2020 10:07  AM    11/23/2020 Total cholesterol 198, HDL 27, LDL 108, triglycerides 363  01/16/2022 Cholesterol 222, HDL 28, LDL 121, triglycerides 367  Wt Readings from Last 3 Encounters:  05/19/22 99 lb 12.8 oz (45.3 kg)  12/25/21 99 lb (44.9 kg)  12/04/21 98 lb 6.4 oz (44.6 kg)     Objective:    Vital Signs:  BP 138/60   Pulse 70   Ht 5' 2"  (1.575 m)   Wt 99 lb 12.8 oz (45.3 kg)   SpO2 94%   BMI 18.25 kg/m     General: Alert, oriented x3, no distress, peers very frail and thin. Head: no evidence of trauma, PERRL, EOMI, no exophtalmos or lid lag, no myxedema, no xanthelasma; normal ears, nose and oropharynx Neck: normal jugular venous pulsations and no hepatojugular reflux; brisk carotid pulses without delay and no carotid bruits Chest: clear to auscultation, no signs of consolidation by percussion or palpation, normal fremitus, symmetrical and full respiratory excursions Cardiovascular: normal position and quality of the apical impulse, regular rhythm, prosthetic valve clicks with a very faint systolic ejection murmur heard in the mid precordial area, no diastolic murmurs, rubs or gallops Abdomen: no tenderness or distention, no masses by palpation, no abnormal pulsatility or arterial bruits, normal bowel sounds, no hepatosplenomegaly Extremities: no clubbing, cyanosis or edema; 2+ radial, ulnar and brachial pulses bilaterally; 2+ right femoral, posterior tibial and dorsalis pedis pulses; 2+ left femoral, posterior tibial and dorsalis pedis pulses; no subclavian or femoral bruits Neurological: grossly nonfocal Psych: Normal mood and affect     ASSESSMENT & PLAN:    CHF: [place]coli euvolemic and overall appears better, although she remains quite frail.  OptiVol is out of range but seems to be approaching normal  values soon.[name]  LVEF has recently decreased down to 30-35%.  Plan to recheck LV ejection fraction in a few months, to see if her reduction in EF was explained by the reduction of biventricular pacing.  Currently not taking loop diuretics or potassium supplements, but will leave these on her medication list so she can take them "as needed". MVR: Normal prosthetic valve function clinically and on the recent echocardiogram. Anticoagulation: Mildly supratherapeutic INR today.  Denies any bleeding problems recently.  Most recent hemoglobin 10.7. CRT-D: Defibrillator therapies are not active on her device.  Problems with oversensing on her right ventricular lead and recovery of LV function have led to discontinuation of tachycardia therapies.  She now has diminished left ventricular ejection fraction again.  She has never required tachycardia therapies.   Dr. Quentin Ore did not think she was a good candidate for lead revision. PVCs/junctional rhythm: Proved after increasing the dose of metoprolol to 50 mg twice daily. AFib: No true atrial fibrillation seen recently.  The episodes of artifactual mode switch are also substantially improved after reprogrammed the atrial sensing parameters. CKD 3: Most recent creatinine of 1.35 is close to our previous estimation of her baseline. Mixed hyperlipidemia: She does not have known CAD or PAD.  Currently on pravastatin 40 mg at bedtime, intolerant of more active statins.  Patient Instructions  Medication Instructions:  Your physician recommends that you continue on your current medications as directed. Please refer to the Current Medication list given to you today.  *If you need a refill on your cardiac medications before your next appointment, please call your pharmacy*   Follow-Up: At [facility], you and your health needs are our priority.  As part of our continuing mission to provide you with  exceptional heart care, we have created designated Provider Care  Teams.  These Care Teams include your primary Cardiologist (physician) and Advanced Practice Providers (APPs -  Physician Assistants and Nurse Practitioners) who all work together to provide you with the care you need, when you need it.  We recommend signing up for the patient portal called "MyChart".  Sign up information is provided on this After Visit Summary.  MyChart is used to connect with patients for Virtual Visits (Telemedicine).  Patients are able to view lab/test results, encounter notes, upcoming appointments, etc.  Non-urgent messages can be sent to your provider as well.   To learn more about what you can do with MyChart, go to [email or web address].    Your next appointment:   12 month(s)  The format for your next appointment:   In Person  Provider:   Sanda Klein, MD   Signed, Sanda Klein, MD  05/19/2022 3:10 PM    [place]

## 2022-05-19 NOTE — Patient Instructions (Signed)
Medication Instructions:  Your physician recommends that you continue on your current medications as directed. Please refer to the Current Medication list given to you today.  *If you need a refill on your cardiac medications before your next appointment, please call your pharmacy*   Follow-Up: At [facility], you and your health needs are our priority.  As part of our continuing mission to provide you with exceptional heart care, we have created designated Provider Care Teams.  These Care Teams include your primary Cardiologist (physician) and Advanced Practice Providers (APPs -  Physician Assistants and Nurse Practitioners) who all work together to provide you with the care you need, when you need it.  We recommend signing up for the patient portal called "MyChart".  Sign up information is provided on this After Visit Summary.  MyChart is used to connect with patients for Virtual Visits (Telemedicine).  Patients are able to view lab/test results, encounter notes, upcoming appointments, etc.  Non-urgent messages can be sent to your provider as well.   To learn more about what you can do with MyChart, go to [email or web address].    Your next appointment:   12 month(s)  The format for your next appointment:   In Person  Provider:   Sanda Klein, MD

## 2022-05-19 NOTE — Patient Instructions (Signed)
Description   Hold today's dose and then continue taking 2 tablets daily except for 1 tablet on Monday, Wednesday and Friday.   Recheck INR in 2 weeks.  Coumadin Clinic [phone].  Stay consistent with 1 helping of greens per week.

## 2022-05-20 ENCOUNTER — Ambulatory Visit: Payer: Medicare Other

## 2022-05-20 ENCOUNTER — Ambulatory Visit (INDEPENDENT_AMBULATORY_CARE_PROVIDER_SITE_OTHER): Payer: Medicare Other

## 2022-05-20 DIAGNOSIS — I480 Paroxysmal atrial fibrillation: Secondary | ICD-10-CM

## 2022-05-20 LAB — CUP PACEART REMOTE DEVICE CHECK
Battery Remaining Longevity: 33 mo
Battery Voltage: 2.95 V
Brady Statistic AP VP Percent: 96.58 %
Brady Statistic AP VS Percent: 1.8 %
Brady Statistic AS VP Percent: 1.36 %
Brady Statistic AS VS Percent: 0.26 %
Brady Statistic RA Percent Paced: 98.02 %
Brady Statistic RV Percent Paced: 12.19 %
Date Time Interrogation Session: 20231219043823
HighPow Impedance: 67 Ohm
Implantable Lead Connection Status: 753985
Implantable Lead Connection Status: 753985
Implantable Lead Connection Status: 753985
Implantable Lead Implant Date: 20080221
Implantable Lead Implant Date: 20080221
Implantable Lead Implant Date: 20080221
Implantable Lead Location: 753858
Implantable Lead Location: 753859
Implantable Lead Location: 753860
Implantable Lead Model: 4543
Implantable Lead Model: 5076
Implantable Lead Model: 7002
Implantable Lead Serial Number: 134379
Implantable Pulse Generator Implant Date: 20181128
Lead Channel Impedance Value: 266 Ohm
Lead Channel Impedance Value: 323 Ohm
Lead Channel Impedance Value: 342 Ohm
Lead Channel Impedance Value: 342 Ohm
Lead Channel Impedance Value: 380 Ohm
Lead Channel Impedance Value: 589 Ohm
Lead Channel Pacing Threshold Amplitude: 0.625 V
Lead Channel Pacing Threshold Amplitude: 1 V
Lead Channel Pacing Threshold Amplitude: 1.125 V
Lead Channel Pacing Threshold Pulse Width: 0.4 ms
Lead Channel Pacing Threshold Pulse Width: 0.4 ms
Lead Channel Pacing Threshold Pulse Width: 0.4 ms
Lead Channel Sensing Intrinsic Amplitude: 1.75 mV
Lead Channel Sensing Intrinsic Amplitude: 1.75 mV
Lead Channel Sensing Intrinsic Amplitude: 26 mV
Lead Channel Sensing Intrinsic Amplitude: 26 mV
Lead Channel Setting Pacing Amplitude: 1.5 V
Lead Channel Setting Pacing Amplitude: 2 V
Lead Channel Setting Pacing Amplitude: 2.25 V
Lead Channel Setting Pacing Pulse Width: 0.4 ms
Lead Channel Setting Pacing Pulse Width: 0.4 ms
Lead Channel Setting Sensing Sensitivity: 1.2 mV
Zone Setting Status: 755011

## 2022-05-20 NOTE — Addendum Note (Signed)
Addended by: Orma Render on: 05/20/2022 08:19 AM   Modules accepted: Orders

## 2022-06-03 ENCOUNTER — Ambulatory Visit: Payer: Medicare Other | Attending: Cardiovascular Disease

## 2022-06-03 DIAGNOSIS — Z952 Presence of prosthetic heart valve: Secondary | ICD-10-CM

## 2022-06-03 DIAGNOSIS — Z5181 Encounter for therapeutic drug level monitoring: Secondary | ICD-10-CM | POA: Diagnosis not present

## 2022-06-03 DIAGNOSIS — Z7901 Long term (current) use of anticoagulants: Secondary | ICD-10-CM | POA: Diagnosis not present

## 2022-06-03 LAB — POCT INR: INR: 5.1 — AB (ref 2.0–3.0)

## 2022-06-03 NOTE — Patient Instructions (Signed)
Hold today and Wednesday then DECREASE TO 1 tablet daily except for 2 tablets on Sunday, Wednesday and Saturday.   Recheck INR in 2 weeks.  Coumadin Clinic [phone].  Stay consistent with 1 helping of greens per week.

## 2022-06-16 NOTE — Progress Notes (Signed)
Remote ICD transmission.   

## 2022-06-17 ENCOUNTER — Ambulatory Visit: Payer: Medicare Other | Attending: Cardiovascular Disease | Admitting: *Deleted

## 2022-06-17 DIAGNOSIS — Z952 Presence of prosthetic heart valve: Secondary | ICD-10-CM | POA: Diagnosis not present

## 2022-06-17 DIAGNOSIS — Z7901 Long term (current) use of anticoagulants: Secondary | ICD-10-CM

## 2022-06-17 LAB — POCT INR: INR: 2.5 (ref 2.0–3.0)

## 2022-06-17 NOTE — Patient Instructions (Signed)
Description   Continue taking warfarin 1 tablet daily except for 2 tablets on Sunday, Wednesday and Saturday. Recheck INR in 3 weeks.  Coumadin Clinic [phone].  Stay consistent with 1 helping of greens per week.

## 2022-06-19 ENCOUNTER — Other Ambulatory Visit: Payer: Self-pay | Admitting: Cardiovascular Disease

## 2022-06-19 NOTE — Telephone Encounter (Signed)
Refill request for warfarin:  Last INR was 2.5 on 06/17/22 Next INR due 07/08/22 LOV was 05/19/22  Refill approved.

## 2022-06-20 ENCOUNTER — Emergency Department (HOSPITAL_COMMUNITY): Payer: Medicare Other

## 2022-06-20 ENCOUNTER — Emergency Department (HOSPITAL_COMMUNITY)
Admission: EM | Admit: 2022-06-20 | Discharge: 2022-06-20 | Disposition: A | Discharge status: Home / Self Care | Payer: Medicare Other | Attending: Emergency Medicine | Admitting: Emergency Medicine

## 2022-06-20 ENCOUNTER — Other Ambulatory Visit: Payer: Self-pay

## 2022-06-20 ENCOUNTER — Encounter (HOSPITAL_COMMUNITY): Payer: Self-pay

## 2022-06-20 DIAGNOSIS — Z79899 Other long term (current) drug therapy: Secondary | ICD-10-CM | POA: Insufficient documentation

## 2022-06-20 DIAGNOSIS — I509 Heart failure, unspecified: Secondary | ICD-10-CM | POA: Diagnosis not present

## 2022-06-20 DIAGNOSIS — R0602 Shortness of breath: Secondary | ICD-10-CM | POA: Diagnosis present

## 2022-06-20 DIAGNOSIS — R7989 Other specified abnormal findings of blood chemistry: Secondary | ICD-10-CM | POA: Diagnosis not present

## 2022-06-20 DIAGNOSIS — I130 Hypertensive heart and chronic kidney disease with heart failure and stage 1 through stage 4 chronic kidney disease, or unspecified chronic kidney disease: Secondary | ICD-10-CM | POA: Insufficient documentation

## 2022-06-20 DIAGNOSIS — N189 Chronic kidney disease, unspecified: Secondary | ICD-10-CM | POA: Diagnosis not present

## 2022-06-20 DIAGNOSIS — Z20822 Contact with and (suspected) exposure to covid-19: Secondary | ICD-10-CM | POA: Insufficient documentation

## 2022-06-20 DIAGNOSIS — J449 Chronic obstructive pulmonary disease, unspecified: Secondary | ICD-10-CM | POA: Insufficient documentation

## 2022-06-20 DIAGNOSIS — Z7901 Long term (current) use of anticoagulants: Secondary | ICD-10-CM | POA: Insufficient documentation

## 2022-06-20 DIAGNOSIS — D649 Anemia, unspecified: Secondary | ICD-10-CM | POA: Diagnosis not present

## 2022-06-20 LAB — CBC WITH DIFFERENTIAL/PLATELET
Abs Immature Granulocytes: 0.03 10*3/uL (ref 0.00–0.07)
Basophils Absolute: 0 10*3/uL (ref 0.0–0.1)
Basophils Relative: 0 %
Eosinophils Absolute: 0.2 10*3/uL (ref 0.0–0.5)
Eosinophils Relative: 2 %
HCT: 31.9 % — ABNORMAL LOW (ref 36.0–46.0)
Hemoglobin: 10.1 g/dL — ABNORMAL LOW (ref 12.0–15.0)
Immature Granulocytes: 0 %
Lymphocytes Relative: 12 %
Lymphs Abs: 0.9 10*3/uL (ref 0.7–4.0)
MCH: 29.9 pg (ref 26.0–34.0)
MCHC: 31.7 g/dL (ref 30.0–36.0)
MCV: 94.4 fL (ref 80.0–100.0)
Monocytes Absolute: 0.8 10*3/uL (ref 0.1–1.0)
Monocytes Relative: 10 %
Neutro Abs: 5.9 10*3/uL (ref 1.7–7.7)
Neutrophils Relative %: 76 %
Platelets: 178 10*3/uL (ref 150–400)
RBC: 3.38 MIL/uL — ABNORMAL LOW (ref 3.87–5.11)
RDW: 14.6 % (ref 11.5–15.5)
WBC: 7.8 10*3/uL (ref 4.0–10.5)
nRBC: 0 % (ref 0.0–0.2)

## 2022-06-20 LAB — I-STAT VENOUS BLOOD GAS, ED
Acid-base deficit: 2 mmol/L (ref 0.0–2.0)
Bicarbonate: 21.5 mmol/L (ref 20.0–28.0)
Calcium, Ion: 1.12 mmol/L — ABNORMAL LOW (ref 1.15–1.40)
HCT: 30 % — ABNORMAL LOW (ref 36.0–46.0)
Hemoglobin: 10.2 g/dL — ABNORMAL LOW (ref 12.0–15.0)
O2 Saturation: 94 %
Potassium: 4.2 mmol/L (ref 3.5–5.1)
Sodium: 136 mmol/L (ref 135–145)
TCO2: 22 mmol/L (ref 22–32)
pCO2, Ven: 31.8 mmHg — ABNORMAL LOW (ref 44–60)
pH, Ven: 7.438 — ABNORMAL HIGH (ref 7.25–7.43)
pO2, Ven: 67 mmHg — ABNORMAL HIGH (ref 32–45)

## 2022-06-20 LAB — RESP PANEL BY RT-PCR (RSV, FLU A&B, COVID)  RVPGX2
Influenza A by PCR: NEGATIVE
Influenza B by PCR: NEGATIVE
Resp Syncytial Virus by PCR: NEGATIVE
SARS Coronavirus 2 by RT PCR: NEGATIVE

## 2022-06-20 LAB — BASIC METABOLIC PANEL
Anion gap: 11 (ref 5–15)
BUN: 20 mg/dL (ref 8–23)
CO2: 21 mmol/L — ABNORMAL LOW (ref 22–32)
Calcium: 8.6 mg/dL — ABNORMAL LOW (ref 8.9–10.3)
Chloride: 101 mmol/L (ref 98–111)
Creatinine, Ser: 1.28 mg/dL — ABNORMAL HIGH (ref 0.44–1.00)
GFR, Estimated: 43 mL/min — ABNORMAL LOW (ref >60)
Glucose, Bld: 93 mg/dL (ref 70–99)
Potassium: 4.1 mmol/L (ref 3.5–5.1)
Sodium: 133 mmol/L — ABNORMAL LOW (ref 135–145)

## 2022-06-20 LAB — PROTIME-INR
INR: 2.4 — ABNORMAL HIGH (ref 0.8–1.2)
Prothrombin Time: 26.1 seconds — ABNORMAL HIGH (ref 11.4–15.2)

## 2022-06-20 LAB — LACTIC ACID, PLASMA
Lactic Acid, Venous: 1.4 mmol/L (ref 0.5–1.9)
Lactic Acid, Venous: 1.4 mmol/L (ref 0.5–1.9)

## 2022-06-20 LAB — TROPONIN I (HIGH SENSITIVITY)
Troponin I (High Sensitivity): 31 ng/L — ABNORMAL HIGH (ref <18)
Troponin I (High Sensitivity): 29 ng/L — ABNORMAL HIGH (ref <18)

## 2022-06-20 LAB — BRAIN NATRIURETIC PEPTIDE: B Natriuretic Peptide: 2340.9 pg/mL — ABNORMAL HIGH (ref 0.0–100.0)

## 2022-06-20 MED ORDER — FUROSEMIDE 20 MG PO TABS: 20.0000 mg | ORAL_TABLET | Freq: Every day | ORAL | 0 refills | Status: DC

## 2022-06-20 MED ORDER — POTASSIUM CHLORIDE CRYS ER 10 MEQ PO TBCR: 10.0000 meq | EXTENDED_RELEASE_TABLET | Freq: Every day | ORAL | 0 refills | Status: DC

## 2022-06-20 MED ORDER — METOPROLOL SUCCINATE ER 25 MG PO TB24
50.0000 mg | ORAL_TABLET | Freq: Once | ORAL | Status: AC
  Administered 2022-06-20: 50 mg via ORAL
  Filled 2022-06-20: qty 2

## 2022-06-20 MED ORDER — FUROSEMIDE 10 MG/ML IJ SOLN
40.0000 mg | Freq: Once | INTRAMUSCULAR | Status: AC
  Administered 2022-06-20: 40 mg via INTRAVENOUS
  Filled 2022-06-20: qty 4

## 2022-06-20 NOTE — ED Notes (Addendum)
Ambulated with pulse ox Maintained 93-94% the whole time. HR remained stable throughout  Pt reports some lightheadedness  Pt used walker per baseline  RN made aware

## 2022-06-20 NOTE — ED Triage Notes (Signed)
Pt to ED via EMS with c/o SOB, and cough x5 days. Pt states it started monday and she was seen at her PCP and tested negative for covid/flu. EMS stated they gave 1dueoneb, and '125mg'$  of solumedrol PTA. Pt arrives A[facility]4 in triage, 02 sats 96% on RA and has productive cough.

## 2022-06-20 NOTE — ED Notes (Signed)
Patient walked a lap around the nursing station and her O2 sat stayed between 93-94%.

## 2022-06-20 NOTE — ED Provider Notes (Signed)
[facility] Provider Note   CSN: [id number] Arrival date & time: 06/20/22  1601     History  Chief Complaint  Patient presents with   Shortness of Breath    [name] is a 82 y.o. female.  Pt is a 82 yo female with a pmhx significant for gerd, hld, anemia, mvp, chf, ckd, fibromyalgia, ibs, pud, h/o mitral valve replacement, afib on coumadin, and copd.  Pt said she's been sob for the past few days.  She did see her pcp on 1/17 and was put on augmentin, mucinex, and delsyn.  Pt said she is worse.  EMS was called and brought pt to the ED.  She was given 125 mg solumedrol as well as a duoneb en route.  Pt did have a rapid covid/flu at pcp's office and it was negative.  Pt denies f/c.  She's been compliant with meds.       Home Medications Prior to Admission medications   Medication Sig Start Date End Date Taking? Authorizing Provider  furosemide (LASIX) 20 MG tablet Take 1 tablet (20 mg total) by mouth daily. 06/20/22  Yes Isla Pence, MD  potassium chloride SA (KLOR-CON M) 10 MEQ tablet Take 1 tablet (10 mEq total) by mouth daily. 06/20/22  Yes Isla Pence, MD  acetaminophen (TYLENOL) 650 MG CR tablet Take 650 mg by mouth every 8 (eight) hours as needed for pain.    [provider]  allopurinol (ZYLOPRIM) 300 MG tablet Take 300 mg by mouth every evening.     [provider]  ALPRAZolam Duanne Moron) 0.25 MG tablet Take 0.25 mg by mouth at bedtime as needed for anxiety or sleep.     [provider]  antiseptic oral rinse (BIOTENE) LIQD 15 mLs by Mouth Rinse route as needed for dry mouth.    [provider]  buPROPion (WELLBUTRIN SR) 150 MG 12 hr tablet Take 150 mg by mouth in the morning.    [provider]  esomeprazole (NEXIUM) 40 MG capsule Take 1 capsule (40 mg total) by mouth daily before breakfast. 12/25/21   Esterwood, Amy S, PA-C  fluticasone (FLONASE) 50 MCG/ACT nasal spray Place  1 spray into both nostrils as needed for allergies.    [provider]  furosemide (LASIX) 40 MG tablet TAKE 1 TABLET BY MOUTH DAILY. **NEEDS OV FOR FUTURE REFILLS** Patient not taking: Reported on 05/19/2022 03/21/21   Lorretta Harp, MD  icosapent Ethyl (VASCEPA) 1 g capsule TAKE 2 CAPSULES BY MOUTH 2 TIMES DAILY. 08/29/21   Lorretta Harp, MD  levothyroxine (SYNTHROID) 75 MCG tablet Take 75 mcg by mouth daily before breakfast.    [provider]  memantine (NAMENDA) 5 MG tablet Take 5 mg by mouth every morning. 04/21/22   [provider]  metoCLOPramide (REGLAN) 5 MG tablet Take 5 mg by mouth 2 (two) times daily as needed for nausea or vomiting. 11/05/21   [provider]  metoprolol succinate (TOPROL-XL) 50 MG 24 hr tablet Take 1 tablet (50 mg total) by mouth 2 (two) times daily. Take with or immediately following a meal. Patient taking differently: Take 75 mg by mouth 2 (two) times daily. Take with or immediately following a meal. 12/04/21   Vickie Epley, MD  Multiple Vitamin (MULTIVITAMIN WITH MINERALS) TABS tablet Take 1 tablet by mouth daily.    [provider]  phenylephrine (SUDAFED PE) 10 MG TABS tablet Take 10 mg by mouth at  bedtime as needed (Congestion).    [provider]  potassium chloride (KLOR-CON M) 10 MEQ tablet Take 1 tablet (10 mEq total) by mouth daily. Patient not taking: Reported on 05/19/2022 11/19/21   Lenna Sciara, NP  pravastatin (PRAVACHOL) 40 MG tablet Take 40 mg at bedtime by mouth.     [provider]  PROAIR HFA 108 (90 BASE) MCG/ACT inhaler Inhale 1-2 puffs into the lungs every 6 (six) hours as needed for wheezing. 01/23/15   [provider]  Sennosides (SENNA) 15 MG TABS Take 30 mg by mouth at bedtime.    [provider]  spironolactone (ALDACTONE) 25 MG tablet Take 1 tablet (25 mg total) by mouth daily. Patient not taking: Reported on 05/19/2022 08/19/21   Lorretta Harp,  MD  VOLTAREN 1 % GEL Apply 2-4 g topically in the morning and at bedtime.    [provider]  warfarin (COUMADIN) 2.5 MG tablet TAKE 1 TO 2 TABLETS BY MOUTH DAILY AS DIRECTED BY THE COUMADIN CLINIC 06/19/22   Lorretta Harp, MD      Allergies    Ace inhibitors, Lipitor [atorvastatin calcium], Megace [megestrol], Aspirin, Clorazepate dipotassium, Simvastatin, Tiotropium bromide monohydrate, Codeine, and Tape    Review of Systems   Review of Systems  Respiratory:  Positive for cough, shortness of breath and wheezing.   All other systems reviewed and are negative.   Physical Exam Updated Vital Signs BP (!) 164/75   Pulse 78   Temp 98.1 F (36.7 C) (Oral)   Resp (!) 25   Ht '5\' 5"'$  (1.651 m)   Wt 45.6 kg   SpO2 95%   BMI 16.73 kg/m  Physical Exam Vitals and nursing note reviewed.  Constitutional:      Appearance: She is well-developed. She is ill-appearing.  HENT:     Head: Normocephalic and atraumatic.     Mouth/Throat:     Pharynx: Oropharynx is clear.  Eyes:     Extraocular Movements: Extraocular movements intact.     Pupils: Pupils are equal, round, and reactive to light.  Cardiovascular:     Rate and Rhythm: Normal rate and regular rhythm.  Pulmonary:     Effort: Tachypnea present.     Breath sounds: Wheezing and rhonchi present.  Abdominal:     General: Bowel sounds are normal.     Palpations: Abdomen is soft.  Musculoskeletal:        General: Normal range of motion.     Cervical back: Normal range of motion and neck supple.  Skin:    General: Skin is warm.     Capillary Refill: Capillary refill takes less than 2 seconds.  Neurological:     General: No focal deficit present.     Mental Status: She is alert and oriented to person, place, and time.  Psychiatric:        Mood and Affect: Mood normal.        Behavior: Behavior normal.     ED Results / Procedures / Treatments   Labs (all labs ordered are listed, but only abnormal results are  displayed) Labs Reviewed  BASIC METABOLIC PANEL - Abnormal; Notable for the following components:      Result Value   Sodium 133 (*)    CO2 21 (*)    Creatinine, Ser 1.28 (*)    Calcium 8.6 (*)    GFR, Estimated 43 (*)    All other components within normal limits  CBC WITH DIFFERENTIAL/PLATELET -  Abnormal; Notable for the following components:   RBC 3.38 (*)    Hemoglobin 10.1 (*)    HCT 31.9 (*)    All other components within normal limits  BRAIN NATRIURETIC PEPTIDE - Abnormal; Notable for the following components:   B Natriuretic Peptide 2,340.9 (*)    All other components within normal limits  PROTIME-INR - Abnormal; Notable for the following components:   Prothrombin Time 26.1 (*)    INR 2.4 (*)    All other components within normal limits  I-STAT VENOUS BLOOD GAS, ED - Abnormal; Notable for the following components:   pH, Ven 7.438 (*)    pCO2, Ven 31.8 (*)    pO2, Ven 67 (*)    Calcium, Ion 1.12 (*)    HCT 30.0 (*)    Hemoglobin 10.2 (*)    All other components within normal limits  TROPONIN I (HIGH SENSITIVITY) - Abnormal; Notable for the following components:   Troponin I (High Sensitivity) 31 (*)    All other components within normal limits  TROPONIN I (HIGH SENSITIVITY) - Abnormal; Notable for the following components:   Troponin I (High Sensitivity) 29 (*)    All other components within normal limits  RESP PANEL BY RT-PCR (RSV, FLU A&B, COVID)  [id number]  LACTIC ACID, PLASMA  LACTIC ACID, PLASMA    EKG EKG Interpretation  Date/Time:  Friday June 20 2022 16:12:37 EST Ventricular Rate:  82 PR Interval:  65 QRS Duration: 128 QT Interval:  419 QTC Calculation: 490 R Axis:   99 Text Interpretation: Ventricular-paced complexes No further analysis attempted due to paced rhythm No significant change since last tracing Confirmed by Isla Pence [phone]) on 06/20/2022 4:32:45 PM  Radiology DG Chest Port 1 View  Result Date: 06/20/2022 CLINICAL DATA:   Shortness of breath EXAM: PORTABLE CHEST 1 VIEW COMPARISON:  Chest x-ray dated June 10, 2021 FINDINGS: Cardiac and mediastinal contours are within normal limits status post median sternotomy. Prior cardiac valve replacement. Left chest wall AICD with unchanged lead position. Capped epicardial pacing leads noted. Diffuse interstitial opacities which are new when compared with prior. No large pleural effusion or pneumothorax. IMPRESSION: Diffuse interstitial opacities which are new when compared with prior, likely due to pulmonary edema. Electronically Signed   By: Yetta Glassman M.D.   On: 06/20/2022 16:39    Procedures Procedures    Medications Ordered in ED Medications  furosemide (LASIX) injection 40 mg (40 mg Intravenous Given 06/20/22 1759)  metoprolol succinate (TOPROL-XL) 24 hr tablet 50 mg (50 mg Oral Given 06/20/22 1947)    ED Course/ Medical Decision Making/ A&P                             Medical Decision Making Amount and/or Complexity of Data Reviewed Labs: ordered. Radiology: ordered.  Risk Prescription drug management.   This patient presents to the ED for concern of sob, this involves an extensive number of treatment options, and is a complaint that carries with it a high risk of complications and morbidity.  The differential diagnosis includes pna, covid/flu, chf   Co morbidities that complicate the patient evaluation  gerd, hld, anemia, mvp, chf, ckd, fibromyalgia, ibs, pud, h/o mitral valve replacement, afib on coumadin, and copd.   Additional history obtained:  Additional history obtained from epic chart review External records from outside source obtained and reviewed including EMS report   Lab Tests:  I Ordered, and personally interpreted labs.  The pertinent results include:  cbc with hgb 10.1 (chronic), cmp with cr 1.28 (chronic); 1st trop sl elevated at 31, but it is down to 29 on the 2nd one; inr 2.4; bnp elevated at 2340   Imaging Studies  ordered:  I ordered imaging studies including cxr  I independently visualized and interpreted imaging which showed  Diffuse interstitial opacities which are new when compared with  prior, likely due to pulmonary edema.   I agree with the radiologist interpretation   Cardiac Monitoring:  The patient was maintained on a cardiac monitor.  I personally viewed and interpreted the cardiac monitored which showed an underlying rhythm of: nsr   Medicines ordered and prescription drug management:  I ordered medication including lasix  for chf  Reevaluation of the patient after these medicines showed that the patient improved I have reviewed the patients home medicines and have made adjustments as needed   Test Considered:  cxr   Critical Interventions:  lasix   Problem List / ED Course:  CHF:  pt has been off diuretics.  She feels much better after IV lasix and is able to ambulate without any problems.  She is stable for d/c.  She is to f/u with cards.  Return if worse.  HTN:  pt has not taken her evening meds.  She is given a dose of toprol.  She is to take the rest of her usual meds when she gets home.   Reevaluation:  After the interventions noted above, I reevaluated the patient and found that they have :improved   Social Determinants of Health:  Lives at home with husband   Dispostion:  After consideration of the diagnostic results and the patients response to treatment, I feel that the patent would benefit from discharge with outpatient f/u.          Final Clinical Impression(s) / ED Diagnoses Final diagnoses:  Acute on chronic congestive heart failure, unspecified heart failure type ([place])    Rx / DC Orders ED Discharge Orders          Ordered    furosemide (LASIX) 20 MG tablet  Daily        06/20/22 2030    potassium chloride SA (KLOR-CON M) 10 MEQ tablet  Daily        06/20/22 2030              Isla Pence, MD 06/20/22 2033

## 2022-07-04 ENCOUNTER — Telehealth: Payer: Self-pay

## 2022-07-04 NOTE — Telephone Encounter (Signed)
Per Dr. Gwenlyn Found, Kathleen Harp, MD  [place], Dani Gobble, MD; Beatrix Fetters, RN Rexanne Mano, Ms Cunning needs a ROV with either myself or an APP because of her recent ER visit for SOB responsive to diuresis.  Left message for pt to call back and schedule.

## 2022-07-06 NOTE — Progress Notes (Unsigned)
Cardiology Office Note:    Date:  07/10/2022   ID:  [name], DOB May 03, 1941, MRN [id number]  PCP:  [name], MD   [place] Providers Cardiologist:  Sanda Klein, MD Electrophysiologist:  Vickie Epley, MD { Referring MD: [name], MD   Chief Complaint  Patient presents with   Follow-up    ER follow up    History of Present Illness:    [name] is a 82 y.o. female with a hx of mechanical mitral valve (repair 2002, replacement 2007), biventricular PPM/ICD (medtronic), chronic diastolic heart failure with improved EF after CRT-D, HTN, HLD, CKD III, prior GI bleed, PAF, and COPD.  She has a history of valvular heart disease, as above.  She is now maintained on Coumadin for mechanical mitral valve.  She did not have obstructive CAD when evaluated prior to her mitral valve surgery.  Her EF has been as low as 10% but improved substantially after implantation of CRT-D device.  Echocardiograms in 2021 and 2022 with LVEF 45-50% on toprol and spironolactone.   Echo 2023 showed a reduction in LVEF 30-35% felt possibly related to reduction in effective biventricular pacing. Dr. Quentin Ore has seen her and increased BB to reduce V sensed beats.  She has had issues with anemia in the past, BMI under 18. She was last seen by Dr. Sallyanne Kuster 05/19/22 and noted improving thoracic impedence.  She was seen in the ER 06/20/22 for shortness of breath. She saw her PCP two days prior and was started on augmentin, mucinex, and delsyn. COVID/flu negative at that time. She called EMS for worsening SOB and was brought to the ER, duonebs in route.  She received IV lasix with improvement. She was able to discharge without admission. BNP was 2341, CXR with edema.    She presents today for cardiology follow up. She states her breathing is back to baseline. She chronically sleeps on 2 pillows. No chest pain, DOE, or orthopnea/PND. She takes 40 mg lasix every day with 10  mEq potassium. She has a very positive outlook, is compliant with medications and follow up. Overall, she is much improved from her URI.     Past Medical History:  Diagnosis Date   Allergy    seasonal   Anemia    Anxiety    Arthritis    Asthma    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 Protecta   Biventricular ICD (implantable cardioverter-defibrillator) in place    with CRT   Blood transfusion without reported diagnosis    Bursitis    Cataract    RIGHT EYE   CHF (congestive heart failure) ([place])    Chronic kidney disease    Colon polyp    adenomatous   Complication of anesthesia    patient stated that had difficulty getting the breathing tube removed, patient said that she stopped breathing and HR dropped to 10  patient then woke up and started breathing pateint stated no longer than one minute; re-intubated in PACU following cholecystectomy 10/28/13   COPD (chronic obstructive pulmonary disease) ([place])    Depression    Diverticulosis    Dysrhythmia    Fibromyalgia    GERD (gastroesophageal reflux disease)    Gout    H/O mitral valve replacement 2002, 2007   Heart murmur    Hemorrhoids    Hyperlipidemia    Hypertension    Hypothyroidism    IBS (irritable bowel syndrome)    MVP (mitral  valve prolapse)    NAUSEA AND VOMITING 08/01/2009   Neuromuscular disorder (HCC)    fibromyalgia   Pacemaker    Peptic ulcer disease     Past Surgical History:  Procedure Laterality Date   ABDOMINAL HYSTERECTOMY     BIV ICD GENERATOR CHANGEOUT N/A 04/29/2017   Procedure: BIV ICD GENERATOR CHANGEOUT;  Surgeon: Sanda Klein, MD;  Location: [facility];  Service: Cardiovascular;  Laterality: N/A;   CARDIAC CATHETERIZATION  02/25/2006   normal left main, normal LAD, normal L Cfx, normal/dominant RCA (Dr. [name])   [place]  2007, 11/202012   x2 (pacemaker) (Dr. Jerilynn Mages. Croitoru)   [place]  2002   MV repair -  Dr. Berle Mull   CHOLECYSTECTOMY N/A 10/28/2013   Procedure: LAPAROSCOPIC CHOLECYSTECTOMY WITH INTRAOPERATIVE CHOLANGIOGRAM;  Surgeon: Edward Jolly, MD;  Location: [facility];  Service: General;  Laterality: N/A;   COLONOSCOPY     ENTEROSCOPY N/A 11/09/2021   Procedure: ENTEROSCOPY;  Surgeon: Irving Copas., MD;  Location: [facility];  Service: Gastroenterology;  Laterality: N/A;   ESOPHAGOGASTRODUODENOSCOPY (EGD) WITH PROPOFOL N/A 03/02/2021   Procedure: ESOPHAGOGASTRODUODENOSCOPY (EGD) WITH PROPOFOL;  Surgeon: Daryel November, MD;  Location: [facility];  Service: Gastroenterology;  Laterality: N/A;   EYE SURGERY     HEMOSTASIS CLIP PLACEMENT  03/02/2021   Procedure: HEMOSTASIS CLIP PLACEMENT;  Surgeon: Daryel November, MD;  Location: [facility];  Service: Gastroenterology;;   HEMOSTASIS CLIP PLACEMENT  11/09/2021   Procedure: HEMOSTASIS CLIP PLACEMENT;  Surgeon: Irving Copas., MD;  Location: [facility];  Service: Gastroenterology;;   HOT HEMOSTASIS  03/02/2021   Procedure: HOT HEMOSTASIS (ARGON PLASMA COAGULATION/BICAP);  Surgeon: Daryel November, MD;  Location: [facility];  Service: Gastroenterology;;   HOT HEMOSTASIS N/A 11/09/2021   Procedure: HOT HEMOSTASIS (ARGON PLASMA COAGULATION/BICAP);  Surgeon: Irving Copas., MD;  Location: [facility];  Service: Gastroenterology;  Laterality: N/A;   IMPLANTABLE CARDIOVERTER DEFIBRILLATOR (ICD) GENERATOR CHANGE N/A 04/10/2011   Procedure: ICD GENERATOR CHANGE;  Surgeon: Sanda Klein, MD;  Location: [facility];  Service: Cardiovascular;  Laterality: N/A;   INSERT / REPLACE / REMOVE PACEMAKER     KNEE ARTHROSCOPY     MITRAL VALVE REPLACEMENT  02/26/2006   re-do MVR w/63m St. [name] (Dr. BEllison Hughs   [place] 2005   persantine myoview - low ris, EF 63%   RIGHT HEART CATH  04/03/2006   pulm cap wedge pressure 24/24, PA pressure 43/22 (mean 328mg), CO 4.8, CI 4.1 (Dr. J.Jackie Plum   SCLEROTHERAPY  03/02/2021   Procedure: SCClide Deutscher Surgeon: CuDaryel NovemberMD;  [place] Service: Gastroenterology;;   SUBMUCOSAL TATTOO INJECTION  11/09/2021   Procedure: SUBMUCOSAL TATTOO INJECTION;  Surgeon: MaIrving Copas MD;  [place] Service: Gastroenterology;;   TOTAL SHOULDER ARTHROPLASTY Right 12/27/2015   Procedure: RIGHT TOTAL SHOULDER ARTHROPLASTY;  Surgeon: JuTania AdeMD;  [place] [facility] Service: Orthopedics;  Laterality: Right;  Right total shoulder arthroplasty   TOTAL SHOULDER ARTHROPLASTY Left 10/27/2019   Procedure: REVERSE TOTAL SHOULDER ARTHROPLASTY;  Surgeon: ChTania AdeMD;  Location[facility];  Service: Orthopedics;  Laterality: Left;   TRANSTHORACIC ECHOCARDIOGRAM  12/2011   EF 50-55%, mild global hypokinesis; LA severely dilated; calcification of anterior/posterior MV leaflets, bi-leaflets St. [name] mechanical MV; mild TR; trace AV regurg/pulm valve regurg    Current Medications: Current Meds  Medication Sig   acetaminophen (  TYLENOL) 650 MG CR tablet Take 650 mg by mouth every 8 (eight) hours as needed for pain.   allopurinol (ZYLOPRIM) 300 MG tablet Take 300 mg by mouth every evening.    ALPRAZolam (XANAX) 0.25 MG tablet Take 0.25 mg by mouth at bedtime as needed for anxiety or sleep.    antiseptic oral rinse (BIOTENE) LIQD 15 mLs by Mouth Rinse route as needed for dry mouth.   buPROPion (WELLBUTRIN SR) 150 MG 12 hr tablet Take 150 mg by mouth in the morning.   esomeprazole (NEXIUM) 40 MG capsule Take 1 capsule (40 mg total) by mouth daily before breakfast.   fluticasone (FLONASE) 50 MCG/ACT nasal spray Place 1 spray into both nostrils as needed for allergies.   furosemide (LASIX) 20 MG tablet Take 1 tablet (20 mg total) by mouth daily.   furosemide (LASIX) 40 MG tablet TAKE 1 TABLET BY MOUTH DAILY. **NEEDS OV FOR FUTURE REFILLS**   icosapent Ethyl (VASCEPA) 1 g capsule TAKE 2 CAPSULES BY MOUTH 2 TIMES DAILY.    ipratropium (ATROVENT) 0.06 % nasal spray 2 sprays in each nostril Nasally Three times a day for 10 days   levothyroxine (SYNTHROID) 75 MCG tablet Take 75 mcg by mouth daily before breakfast.   memantine (NAMENDA) 5 MG tablet Take 5 mg by mouth every morning.   metoCLOPramide (REGLAN) 5 MG tablet Take 5 mg by mouth 2 (two) times daily as needed for nausea or vomiting.   metoprolol succinate (TOPROL-XL) 50 MG 24 hr tablet Take 1 tablet (50 mg total) by mouth 2 (two) times daily. Take with or immediately following a meal. (Patient taking differently: Take 75 mg by mouth 2 (two) times daily. Take with or immediately following a meal.)   Multiple Vitamin (MULTIVITAMIN WITH MINERALS) TABS tablet Take 1 tablet by mouth daily.   phenylephrine (SUDAFED PE) 10 MG TABS tablet Take 10 mg by mouth at bedtime as needed (Congestion).   potassium chloride (KLOR-CON M) 10 MEQ tablet Take 1 tablet (10 mEq total) by mouth daily.   potassium chloride SA (KLOR-CON M) 10 MEQ tablet Take 1 tablet (10 mEq total) by mouth daily.   pravastatin (PRAVACHOL) 40 MG tablet Take 40 mg at bedtime by mouth.    PROAIR HFA 108 (90 BASE) MCG/ACT inhaler Inhale 1-2 puffs into the lungs every 6 (six) hours as needed for wheezing.   Sennosides (SENNA) 15 MG TABS Take 30 mg by mouth at bedtime.   spironolactone (ALDACTONE) 25 MG tablet Take 1 tablet (25 mg total) by mouth daily.   VOLTAREN 1 % GEL Apply 2-4 g topically in the morning and at bedtime.   warfarin (COUMADIN) 2.5 MG tablet TAKE 1 TO 2 TABLETS BY MOUTH DAILY AS DIRECTED BY THE COUMADIN CLINIC   Current Facility-Administered Medications for the 07/10/22 encounter (Office Visit) with Ledora Bottcher, PA  Medication   0.9 %  sodium chloride infusion     Allergies:   Ace inhibitors, Lipitor [atorvastatin calcium], Megace [megestrol], Aspirin, Clorazepate dipotassium, Simvastatin, Tiotropium bromide monohydrate, Codeine, and Tape   Social History   Socioeconomic History    Marital status: Married    Spouse name: [name]   Number of children: 4   Years of education: 12   Highest education level: Not on file  Occupational History   Occupation: retired  Tobacco Use   Smoking status: Former    Types: Cigarettes    Quit date: 06/02/2001    Years since quitting: 21.1   Smokeless tobacco:  Never  Vaping Use   Vaping Use: Never used  Substance and Sexual Activity   Alcohol use: Yes    Alcohol/week: 0.0 standard drinks of alcohol    Comment: social-1 every 6 months   Drug use: No   Sexual activity: Not on file  Other Topics Concern   Not on file  Social History Narrative   Drinks very little caffeine    Social Determinants of Health   Financial Resource Strain: Not on file  Food Insecurity: Not on file  Transportation Needs: Not on file  Physical Activity: Not on file  Stress: Not on file  Social Connections: Not on file     Family History: The patient's family history includes Breast cancer in her sister; Crohn's disease in her sister; Heart attack in her father and mother; Heart disease in her father, mother, sister, and son; Kidney disease in her father; Uterine cancer in her sister. There is no history of Colon cancer or Stomach cancer.  ROS:   Please see the history of present illness.     All other systems reviewed and are negative.  EKGs/Labs/Other Studies Reviewed:    The following studies were reviewed today:  Echo 2023 1. Left ventricular ejection fraction, by estimation, is 30 to 35%. The  left ventricle has moderately decreased function. The left ventricle  demonstrates global hypokinesis. Left ventricular diastolic parameters are  indeterminate.   2. Right ventricular systolic function is normal. The right ventricular  size is normal. Tricuspid regurgitation signal is inadequate for assessing  PA pressure.   3. Left atrial size was mildly dilated.   4. The mitral valve has been repaired/replaced. Trivial mitral valve   regurgitation. The mean mitral valve gradient is 3.0 mmHg with average  heart rate of 70 bpm. There is a 25 mm St. [name] mechanical valve present  in the mitral position. Procedure Date:  02/26/2006.   5. The aortic valve is tricuspid. Aortic valve regurgitation is mild.  Aortic valve sclerosis is present, with no evidence of aortic valve  stenosis.   6. The inferior vena cava is normal in size with greater than 50%  respiratory variability, suggesting right atrial pressure of 3 mmHg.   EKG:  EKG is not ordered today.    Recent Labs: 11/12/2021: ALT 11; Magnesium 1.9 06/20/2022: B Natriuretic Peptide 2,340.9; BUN 20; Creatinine, Ser 1.28; Hemoglobin 10.1; Platelets 178; Potassium 4.1; Sodium 133  Recent Lipid Panel    Component Value Date/Time   CHOL 198 11/23/2020 1007   TRIG 363 (H) 11/23/2020 1007   HDL 27 (L) 11/23/2020 1007   CHOLHDL 7.3 (H) 11/23/2020 1007   CHOLHDL 8.2 12/09/2006 0410   VLDL 45 (H) 12/09/2006 0410   LDLCALC 108 (H) 11/23/2020 1007     Risk Assessment/Calculations:                Physical Exam:    VS:  BP 137/66   Pulse 88   Ht '5\' 2"'$  (1.575 m)   Wt 94 lb 3.2 oz (42.7 kg)   SpO2 98%   BMI 17.23 kg/m     Wt Readings from Last 3 Encounters:  07/10/22 94 lb 3.2 oz (42.7 kg)  06/20/22 100 lb 8.5 oz (45.6 kg)  05/19/22 99 lb 12.8 oz (45.3 kg)     GEN:  Well nourished, well developed in no acute distress HEENT: Normal NECK: No JVD; No carotid bruits LYMPHATICS: No lymphadenopathy CARDIAC: RRR, no murmurs, rubs, gallops RESPIRATORY:  Clear to auscultation  without rales, wheezing or rhonchi  ABDOMEN: Soft, non-tender, non-distended MUSCULOSKELETAL:  No edema; No deformity  SKIN: Warm and dry NEUROLOGIC:  Alert and oriented x 3 PSYCHIATRIC:  Normal affect   ASSESSMENT:    1. Chronic combined systolic and diastolic CHF (congestive heart failure) ([place])   2. Primary hypertension   3. History of mitral valve replacement with mechanical valve    4. Long term (current) use of anticoagulants   5. Biventricular ICD (implantable cardioverter-defibrillator) in place   6. Accelerated junctional rhythm   7. Paroxysmal atrial fibrillation (HCC)   8. PVC (premature ventricular contraction)   9. Hyperlipidemia with target LDL less than 70    PLAN:    In order of problems listed above:  Chronic systolic and diastolic heart failure Hypertension  Despite compliance on medications, required IV lasix in the ER Not completely surprising given URI She takes 40 mg lasix + 10 mEq K every day, fluid balance at baseline Will recheck an echo at the end of April 2024 with a Dr. Sallyanne Kuster appt after in May GDMT: 50 mg toprol, 25 mg spironolactone   Mechanical mitral valve  Coumadin therapy INR per coumadin clinic - labile   CRT-D Defibrillator therapies not active - was oversensing her right lead in the setting of improved EF. Now with reduced LVEF, Dr. Quentin Ore was re-engaged but did not think she was a good candidate for lead revision. This has been discussed previously.    PVCs Junctional rhythm Doing well on BB   Hyperlipidemia  Pravastatin 40 mg Intolerant to other statins Follows with Dr. Debara Pickett for lipids   Echo end of April with close follow up with Dr. Sallyanne Kuster.     Medication Adjustments/Labs and Tests Ordered: Current medicines are reviewed at length with the patient today.  Concerns regarding medicines are outlined above.  Orders Placed This Encounter  Procedures   ECHOCARDIOGRAM COMPLETE   No orders of the defined types were placed in this encounter.   Patient Instructions  Medication Instructions:  No Changes *If you need a refill on your cardiac medications before your next appointment, please call your pharmacy*   Lab Work: No labs If you have labs (blood work) drawn today and your tests are completely normal, you will receive your results only by: [facility] (if you have MyChart) OR A paper copy in  the mail If you have any lab test that is abnormal or we need to change your treatment, we will call you to review the results.   Testing/Procedures: [address]. Your physician has requested that you have an echocardiogram. Echocardiography is a painless test that uses sound waves to create images of your heart. It provides your doctor with information about the size and shape of your heart and how well your heart's chambers and valves are working. This procedure takes approximately one hour. There are no restrictions for this procedure. Please do NOT wear cologne, perfume, aftershave, or lotions (deodorant is allowed). Please arrive 15 minutes prior to your appointment time.    Follow-Up: At [facility], you and your health needs are our priority.  As part of our continuing mission to provide you with exceptional heart care, we have created designated Provider Care Teams.  These Care Teams include your primary Cardiologist (physician) and Advanced Practice Providers (APPs -  Physician Assistants and Nurse Practitioners) who all work together to provide you with the care you need, when you need it.  We recommend signing  up for the patient portal called "MyChart".  Sign up information is provided on this After Visit Summary.  MyChart is used to connect with patients for Virtual Visits (Telemedicine).  Patients are able to view lab/test results, encounter notes, upcoming appointments, etc.  Non-urgent messages can be sent to your provider as well.   To learn more about what you can do with MyChart, go to [email or web address].    Your next appointment:   3 month(s)  Provider:   Sanda Klein, MD     Signed, [place], [place]  07/10/2022 9:29 AM    [facility]

## 2022-07-08 ENCOUNTER — Ambulatory Visit: Payer: Medicare Other | Attending: Internal Medicine

## 2022-07-08 DIAGNOSIS — Z7901 Long term (current) use of anticoagulants: Secondary | ICD-10-CM | POA: Diagnosis not present

## 2022-07-08 DIAGNOSIS — Z952 Presence of prosthetic heart valve: Secondary | ICD-10-CM

## 2022-07-08 DIAGNOSIS — Z5181 Encounter for therapeutic drug level monitoring: Secondary | ICD-10-CM | POA: Diagnosis not present

## 2022-07-08 LAB — POCT INR: INR: 5.5 — AB (ref 2.0–3.0)

## 2022-07-08 NOTE — Patient Instructions (Signed)
HOLD TODAY AND TOMORROW THEN TAKE 0.5 TABLET ON THURSDAY THEN Continue taking warfarin 1 tablet daily except for 2 tablets on Sunday, Wednesday and Saturday. Recheck INR in 2 weeks.  Coumadin Clinic [phone].  Stay consistent with 1 helping of greens per week.

## 2022-07-10 ENCOUNTER — Ambulatory Visit: Payer: Medicare Other | Attending: Physician Assistant | Admitting: Physician Assistant

## 2022-07-10 ENCOUNTER — Encounter: Payer: Self-pay | Admitting: Student

## 2022-07-10 ENCOUNTER — Encounter: Payer: Self-pay | Admitting: Physician Assistant

## 2022-07-10 ENCOUNTER — Ambulatory Visit: Payer: Medicare Other | Attending: Student | Admitting: Student

## 2022-07-10 VITALS — BP 137/66 | HR 88 | Ht 62.0 in | Wt 94.2 lb

## 2022-07-10 VITALS — BP 134/76 | HR 74 | Ht 62.0 in | Wt 92.8 lb

## 2022-07-10 DIAGNOSIS — Z952 Presence of prosthetic heart valve: Secondary | ICD-10-CM | POA: Diagnosis not present

## 2022-07-10 DIAGNOSIS — Z7901 Long term (current) use of anticoagulants: Secondary | ICD-10-CM

## 2022-07-10 DIAGNOSIS — I498 Other specified cardiac arrhythmias: Secondary | ICD-10-CM

## 2022-07-10 DIAGNOSIS — I480 Paroxysmal atrial fibrillation: Secondary | ICD-10-CM

## 2022-07-10 DIAGNOSIS — Z95810 Presence of automatic (implantable) cardiac defibrillator: Secondary | ICD-10-CM

## 2022-07-10 DIAGNOSIS — I10 Essential (primary) hypertension: Secondary | ICD-10-CM | POA: Diagnosis not present

## 2022-07-10 DIAGNOSIS — I493 Ventricular premature depolarization: Secondary | ICD-10-CM

## 2022-07-10 DIAGNOSIS — I5042 Chronic combined systolic (congestive) and diastolic (congestive) heart failure: Secondary | ICD-10-CM

## 2022-07-10 DIAGNOSIS — E785 Hyperlipidemia, unspecified: Secondary | ICD-10-CM

## 2022-07-10 LAB — CUP PACEART INCLINIC DEVICE CHECK
Battery Remaining Longevity: 32 mo
Battery Voltage: 2.95 V
Brady Statistic AP VP Percent: 76.27 %
Brady Statistic AP VS Percent: 2.63 %
Brady Statistic AS VP Percent: 20.39 %
Brady Statistic AS VS Percent: 0.71 %
Brady Statistic RA Percent Paced: 78.37 %
Brady Statistic RV Percent Paced: 16 %
Date Time Interrogation Session: 20240208121950
HighPow Impedance: 72 Ohm
Implantable Lead Connection Status: 753985
Implantable Lead Connection Status: 753985
Implantable Lead Connection Status: 753985
Implantable Lead Implant Date: 20080221
Implantable Lead Implant Date: 20080221
Implantable Lead Implant Date: 20080221
Implantable Lead Location: 753858
Implantable Lead Location: 753859
Implantable Lead Location: 753860
Implantable Lead Model: 4543
Implantable Lead Model: 5076
Implantable Lead Model: 7002
Implantable Lead Serial Number: 134379
Implantable Pulse Generator Implant Date: 20181128
Lead Channel Impedance Value: 323 Ohm
Lead Channel Impedance Value: 342 Ohm
Lead Channel Impedance Value: 399 Ohm
Lead Channel Impedance Value: 437 Ohm
Lead Channel Impedance Value: 437 Ohm
Lead Channel Impedance Value: 665 Ohm
Lead Channel Pacing Threshold Amplitude: 0.625 V
Lead Channel Pacing Threshold Amplitude: 0.625 V
Lead Channel Pacing Threshold Amplitude: 1.125 V
Lead Channel Pacing Threshold Pulse Width: 0.4 ms
Lead Channel Pacing Threshold Pulse Width: 0.4 ms
Lead Channel Pacing Threshold Pulse Width: 0.4 ms
Lead Channel Sensing Intrinsic Amplitude: 2 mV
Lead Channel Sensing Intrinsic Amplitude: 2.375 mV
Lead Channel Sensing Intrinsic Amplitude: 31.625 mV
Lead Channel Sensing Intrinsic Amplitude: 31.625 mV
Lead Channel Setting Pacing Amplitude: 1.25 V
Lead Channel Setting Pacing Amplitude: 1.5 V
Lead Channel Setting Pacing Amplitude: 2.25 V
Lead Channel Setting Pacing Pulse Width: 0.4 ms
Lead Channel Setting Pacing Pulse Width: 0.4 ms
Lead Channel Setting Sensing Sensitivity: 1.2 mV
Zone Setting Status: 755011

## 2022-07-10 NOTE — Progress Notes (Signed)
Electrophysiology Office Note Date: 07/10/2022  ID:  [name], [place] 10/23/1940, MRN [id number]  PCP: [name], MD Primary Cardiologist: Sanda Klein, MD Electrophysiologist: Vickie Epley, MD   CC: Routine ICD follow-up  [name] is a 82 y.o. female seen today for Vickie Epley, MD for post ED follow up.    Seen in ED 06/20/2022 for SOB. Found to be in A/C CHF as well as being treated for URI by her PCP. She received IV lasix with rapid improvement.   Since discharge from hospital the patient reports doing overall better. Her husband was frustrated that noone called her from our office given her symptoms. Explained that her ICD does not monitor for symptoms or for heart attack, but would alert [place] to electrical issues, or potentially if she were holding on to a significant amount of fluid. Currently,  she denies chest pain, palpitations, new or undue dyspnea, PND, orthopnea, nausea, vomiting, dizziness, syncope, edema, weight gain, or early satiety.   She has not had ICD shocks.   Device History: Medtronic BiV ICD implanted 2008, gen change 2018 for CHF  Past Medical History:  Diagnosis Date   Allergy    seasonal   Anemia    Anxiety    Arthritis    Asthma    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 Protecta   Biventricular ICD (implantable cardioverter-defibrillator) in place    with CRT   Blood transfusion without reported diagnosis    Bursitis    Cataract    RIGHT EYE   CHF (congestive heart failure) ([place])    Chronic kidney disease    Colon polyp    adenomatous   Complication of anesthesia    patient stated that had difficulty getting the breathing tube removed, patient said that she stopped breathing and HR dropped to 10  patient then woke up and started breathing pateint stated no longer than one minute; re-intubated in PACU following cholecystectomy 10/28/13   COPD (chronic obstructive pulmonary  disease) ([place])    Depression    Diverticulosis    Dysrhythmia    Fibromyalgia    GERD (gastroesophageal reflux disease)    Gout    H/O mitral valve replacement 2002, 2007   Heart murmur    Hemorrhoids    Hyperlipidemia    Hypertension    Hypothyroidism    IBS (irritable bowel syndrome)    MVP (mitral valve prolapse)    NAUSEA AND VOMITING 08/01/2009   Neuromuscular disorder (HCC)    fibromyalgia   Pacemaker    Peptic ulcer disease     Current Outpatient Medications  Medication Instructions   acetaminophen (TYLENOL) 650 mg, Oral, Every 8 hours PRN   allopurinol (ZYLOPRIM) 300 mg, Oral, Every evening   ALPRAZolam (XANAX) 0.25 mg, Oral, At bedtime PRN   antiseptic oral rinse (BIOTENE) LIQD 15 mLs, Mouth Rinse, As needed   buPROPion (WELLBUTRIN SR) 150 mg, Oral, Every morning   esomeprazole (NEXIUM) 40 mg, Oral, Daily before breakfast   fluticasone (FLONASE) 50 MCG/ACT nasal spray 1 spray, Each Nare, As needed   furosemide (LASIX) 40 MG tablet TAKE 1 TABLET BY MOUTH DAILY. **NEEDS OV FOR FUTURE REFILLS**   furosemide (LASIX) 20 mg, Oral, Daily   icosapent Ethyl (VASCEPA) 1 g capsule TAKE 2 CAPSULES BY MOUTH 2 TIMES DAILY.   ipratropium (ATROVENT) 0.06 % nasal spray 2 sprays in each nostril Nasally Three times a day for 10 days   levothyroxine (  SYNTHROID) 75 mcg, Oral, Daily before breakfast   memantine (NAMENDA) 5 mg, Oral, Every morning   metoCLOPramide (REGLAN) 5 mg, Oral, 2 times daily PRN   metoprolol succinate (TOPROL-XL) 50 mg, Oral, 2 times daily, Take with or immediately following a meal.   Multiple Vitamin (MULTIVITAMIN WITH MINERALS) TABS tablet 1 tablet, Oral, Daily   phenylephrine (SUDAFED PE) 10 mg, Oral, At bedtime PRN   potassium chloride (KLOR-CON M) 10 MEQ tablet 10 mEq, Oral, Daily   potassium chloride SA (KLOR-CON M) 10 MEQ tablet 10 mEq, Oral, Daily   pravastatin (PRAVACHOL) 40 mg, Oral, Daily at bedtime   PROAIR HFA 108 (90 BASE) MCG/ACT inhaler 1-2 puffs,  Inhalation, Every 6 hours PRN   Senna 30 mg, Oral, Daily at bedtime   spironolactone (ALDACTONE) 25 mg, Oral, Daily   Voltaren 2-4 g, Topical, 2 times daily   warfarin (COUMADIN) 2.5 MG tablet TAKE 1 TO 2 TABLETS BY MOUTH DAILY AS DIRECTED BY THE COUMADIN CLINIC    Family History: Family History  Problem Relation Age of Onset   Breast cancer Sister        multiple   Uterine cancer Sister        multiple   Heart disease Mother    Heart attack Mother    Heart disease Father    Kidney disease Father    Heart attack Father    Heart disease Sister    Crohn's disease Sister    Heart disease Son    Colon cancer Neg Hx    Stomach cancer Neg Hx     Physical Exam: Vitals:   07/10/22 1030  BP: 134/76  Pulse: 74  SpO2: 97%  Weight: 92 lb 12.8 oz (42.1 kg)  Height: '5\' 2"'$  (1.575 m)     GEN- NAD. A&O x 3. Normal affect. HEENT: Normocephalic, atraumatic Lungs- CTAB, Normal effort.  Heart- Regular rate and rhythm rate and rhythm. No M/G/R.  Extremities- No peripheral edema. no clubbing or cyanosis Skin- warm and dry, no rash or lesion; ICD pocket well healed  ICD interrogation- reviewed in detail today,  See PACEART report  EKG is not ordered today. EKG from 06/20/2022 reviewed which showed V paced rhythm at 82 bpm  Other studies Reviewed: Additional studies/ records that were reviewed today include: Previous EP office notes.    Assessment and Plan:  1.  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 (Atrial lead FFRWs)  Her husband was initially frustrated that noone "called her to tell her that her heart was in trouble." We reviewed what her ICD does and does not monitor at length.   Given his concern, I offered to enroll them in our [facility] clinic, but they refused stating  she already sees enough doctors.   Current medicines are reviewed at length with the patient today.    Disposition:   Follow up with Dr. Sallyanne Kuster as scheduled.  Follow up with EP APP or Dr. Quentin Ore as needed.  Jacalyn Lefevre, PA-C  07/10/2022 10:46 AM  [facility] [address] [place] [address] [phone] (office) [phone] (fax)

## 2022-07-10 NOTE — Patient Instructions (Signed)
Medication Instructions:  No Changes *If you need a refill on your cardiac medications before your next appointment, please call your pharmacy*   Lab Work: No labs If you have labs (blood work) drawn today and your tests are completely normal, you will receive your results only by: [place] (if you have MyChart) OR A paper copy in the mail If you have any lab test that is abnormal or we need to change your treatment, we will call you to review the results.   Testing/Procedures: [address], [address]. Your physician has requested that you have an echocardiogram. Echocardiography is a painless test that uses sound waves to create images of your heart. It provides your doctor with information about the size and shape of your heart and how well your heart's chambers and valves are working. This procedure takes approximately one hour. There are no restrictions for this procedure. Please do NOT wear cologne, perfume, aftershave, or lotions (deodorant is allowed). Please arrive 15 minutes prior to your appointment time.    Follow-Up: At [facility], you and your health needs are our priority.  As part of our continuing mission to provide you with exceptional heart care, we have created designated Provider Care Teams.  These Care Teams include your primary Cardiologist (physician) and Advanced Practice Providers (APPs -  Physician Assistants and Nurse Practitioners) who all work together to provide you with the care you need, when you need it.  We recommend signing up for the patient portal called "MyChart".  Sign up information is provided on this After Visit Summary.  MyChart is used to connect with patients for Virtual Visits (Telemedicine).  Patients are able to view lab/test results, encounter notes, upcoming appointments, etc.  Non-urgent messages can be sent to your provider as well.   To learn more about what you can do with MyChart, go to  [email or web address].    Your next appointment:   3 month(s)  Provider:   Sanda Klein, MD

## 2022-07-10 NOTE — Patient Instructions (Signed)
Medication Instructions:  Your physician recommends that you continue on your current medications as directed. Please refer to the Current Medication list given to you today.  *If you need a refill on your cardiac medications before your next appointment, please call your pharmacy*   Lab Work: None If you have labs (blood work) drawn today and your tests are completely normal, you will receive your results only by: [place] (if you have MyChart) OR A paper copy in the mail If you have any lab test that is abnormal or we need to change your treatment, we will call you to review the results.   Follow-Up: At [facility], you and your health needs are our priority.  As part of our continuing mission to provide you with exceptional heart care, we have created designated Provider Care Teams.  These Care Teams include your primary Cardiologist (physician) and Advanced Practice Providers (APPs -  Physician Assistants and Nurse Practitioners) who all work together to provide you with the care you need, when you need it.  We recommend signing up for the patient portal called "MyChart".  Sign up information is provided on this After Visit Summary.  MyChart is used to connect with patients for Virtual Visits (Telemedicine).  Patients are able to view lab/test results, encounter notes, upcoming appointments, etc.  Non-urgent messages can be sent to your provider as well.   To learn more about what you can do with MyChart, go to [email or web address].    Your next appointment:   As needed

## 2022-07-22 ENCOUNTER — Ambulatory Visit: Payer: Medicare Other | Attending: Cardiology

## 2022-07-22 DIAGNOSIS — Z952 Presence of prosthetic heart valve: Secondary | ICD-10-CM

## 2022-07-22 DIAGNOSIS — Z7901 Long term (current) use of anticoagulants: Secondary | ICD-10-CM | POA: Diagnosis not present

## 2022-07-22 LAB — POCT INR: INR: 1.6 — AB (ref 2.0–3.0)

## 2022-07-22 NOTE — Patient Instructions (Signed)
Description   Take 2 tablets today, then start taking 1 tablet daily except for 2 tablets on Sundays, Wednesdays and Fridays. Recheck INR in 2 weeks.  Coumadin Clinic [phone].  Stay consistent with 1 helping of greens per week.

## 2022-08-05 ENCOUNTER — Ambulatory Visit: Payer: Medicare Other

## 2022-08-13 ENCOUNTER — Ambulatory Visit: Payer: Medicare Other | Attending: Cardiovascular Disease | Admitting: *Deleted

## 2022-08-13 DIAGNOSIS — Z952 Presence of prosthetic heart valve: Secondary | ICD-10-CM

## 2022-08-13 DIAGNOSIS — Z7901 Long term (current) use of anticoagulants: Secondary | ICD-10-CM | POA: Diagnosis not present

## 2022-08-13 LAB — POCT INR: INR: 3.4 — AB (ref 2.0–3.0)

## 2022-08-13 NOTE — Patient Instructions (Signed)
Description   Continue taking warfarin 1 tablet daily except for 2 tablets on Sundays, Wednesdays and Fridays. Recheck INR in 3 weeks.  Coumadin Clinic [phone].  Stay consistent with 1 helping of greens per week.

## 2022-08-19 ENCOUNTER — Ambulatory Visit (INDEPENDENT_AMBULATORY_CARE_PROVIDER_SITE_OTHER): Payer: Medicare Other

## 2022-08-19 DIAGNOSIS — I5042 Chronic combined systolic (congestive) and diastolic (congestive) heart failure: Secondary | ICD-10-CM | POA: Diagnosis not present

## 2022-08-20 LAB — CUP PACEART REMOTE DEVICE CHECK
Battery Remaining Longevity: 30 mo
Battery Voltage: 2.95 V
Brady Statistic AP VP Percent: 77.53 %
Brady Statistic AP VS Percent: 2.97 %
Brady Statistic AS VP Percent: 19.18 %
Brady Statistic AS VS Percent: 0.32 %
Brady Statistic RA Percent Paced: 80.39 %
Brady Statistic RV Percent Paced: 25.39 %
Date Time Interrogation Session: 20240319001803
HighPow Impedance: 65 Ohm
Implantable Lead Connection Status: 753985
Implantable Lead Connection Status: 753985
Implantable Lead Connection Status: 753985
Implantable Lead Implant Date: 20080221
Implantable Lead Implant Date: 20080221
Implantable Lead Implant Date: 20080221
Implantable Lead Location: 753858
Implantable Lead Location: 753859
Implantable Lead Location: 753860
Implantable Lead Model: 4543
Implantable Lead Model: 5076
Implantable Lead Model: 7002
Implantable Lead Serial Number: 134379
Implantable Pulse Generator Implant Date: 20181128
Lead Channel Impedance Value: 304 Ohm
Lead Channel Impedance Value: 342 Ohm
Lead Channel Impedance Value: 342 Ohm
Lead Channel Impedance Value: 399 Ohm
Lead Channel Impedance Value: 399 Ohm
Lead Channel Impedance Value: 665 Ohm
Lead Channel Pacing Threshold Amplitude: 0.625 V
Lead Channel Pacing Threshold Amplitude: 0.875 V
Lead Channel Pacing Threshold Amplitude: 1.125 V
Lead Channel Pacing Threshold Pulse Width: 0.4 ms
Lead Channel Pacing Threshold Pulse Width: 0.4 ms
Lead Channel Pacing Threshold Pulse Width: 0.4 ms
Lead Channel Sensing Intrinsic Amplitude: 2 mV
Lead Channel Sensing Intrinsic Amplitude: 2 mV
Lead Channel Sensing Intrinsic Amplitude: 30.5 mV
Lead Channel Sensing Intrinsic Amplitude: 30.5 mV
Lead Channel Setting Pacing Amplitude: 1.5 V
Lead Channel Setting Pacing Amplitude: 1.5 V
Lead Channel Setting Pacing Amplitude: 2.25 V
Lead Channel Setting Pacing Pulse Width: 0.4 ms
Lead Channel Setting Pacing Pulse Width: 0.4 ms
Lead Channel Setting Sensing Sensitivity: 1.2 mV
Zone Setting Status: 755011

## 2022-08-22 ENCOUNTER — Other Ambulatory Visit: Payer: Self-pay | Admitting: Internal Medicine

## 2022-08-22 DIAGNOSIS — E782 Mixed hyperlipidemia: Secondary | ICD-10-CM

## 2022-08-29 ENCOUNTER — Other Ambulatory Visit: Payer: Self-pay | Admitting: Physician Assistant

## 2022-09-03 ENCOUNTER — Telehealth: Payer: Self-pay | Admitting: Internal Medicine

## 2022-09-03 ENCOUNTER — Encounter (HOSPITAL_BASED_OUTPATIENT_CLINIC_OR_DEPARTMENT_OTHER): Payer: Self-pay | Admitting: Internal Medicine

## 2022-09-03 ENCOUNTER — Ambulatory Visit (HOSPITAL_BASED_OUTPATIENT_CLINIC_OR_DEPARTMENT_OTHER): Payer: Medicare Other | Admitting: Internal Medicine

## 2022-09-03 VITALS — BP 144/72 | HR 75 | Ht 62.0 in | Wt 93.0 lb

## 2022-09-03 DIAGNOSIS — E782 Mixed hyperlipidemia: Secondary | ICD-10-CM

## 2022-09-03 DIAGNOSIS — E781 Pure hyperglyceridemia: Secondary | ICD-10-CM

## 2022-09-03 MED ORDER — ICOSAPENT ETHYL 1 G PO CAPS: 1.0000 g | ORAL_CAPSULE | Freq: Two times a day (BID) | ORAL | 11 refills | Status: DC

## 2022-09-03 NOTE — Progress Notes (Signed)
LIPID CLINIC CONSULT NOTE  Chief Complaint:  Follow-up dyslipidemia  Primary Care Physician: Garlan Fillers, MD  Primary Cardiologist:  Thurmon Fair, MD  HPI:  [name] is a 82 y.o. female who is being seen today for the evaluation of dyslipidemia at the request of Garlan Fillers, MD. [name] is a pleasant female who was seen last year via audio/video conferencing for a telehealth.  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  At the time she had no significant coronary artery disease.  She does have a history of congestive heart failure, chronic kidney disease, biventricular AICD placement followed by Dr. Salena Saner and family history of heart disease.  She reports a longstanding history of high triglycerides which was present in multiple family members.  She has been on pravastatin 40 mg.  More recently she was referred for evaluation and management of high triglycerides which do seem to be much higher than they had been in the past.  Her most recent lipid profile showed total cholesterol 252, triglycerides 602, HDL 29 and LDL 116.  She is nearly underweight at 99 pounds with a BMI of 18.74 and reports a very restricted diet.  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  11/30/2020  [name] returns today for follow-up.  She continues to do well.  Her weight is minimal but stable at 99 pounds.  She has responded well to Vascepa with marked reduction in triglycerides from 602 down to 363, total cholesterol now 198 and LDL 108.  She reports tolerating Vascepa well.  Currently is covered under a health well grant until February 2023.  Hopefully the health well foundation will be funded next year..  09/03/2022  [name] returns today for follow-up.   Overall she seems to be doing fairly well.  She had lipids rechecked 2 days ago which show a total cholesterol 273, triglycerides 383, HDL 34 and LDL 165, which is up from 462 about a year ago.  She continues to have struggles with weight loss and currently has a BMI of 17.  She reports taking her statin but has been noncompliant with her Vascepa.  PMHx:  Past Medical History:  Diagnosis Date   Allergy    seasonal   Anemia    Anxiety    Arthritis    Asthma    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 Protecta   Biventricular ICD (implantable cardioverter-defibrillator) in place    with CRT   Blood transfusion without reported diagnosis    Bursitis    Cataract    RIGHT EYE   CHF (congestive heart failure)    Chronic kidney disease    Colon polyp    adenomatous   Complication of anesthesia    patient stated that had difficulty getting the breathing tube removed, patient said that she stopped breathing and HR dropped to 10  patient then woke up and started breathing pateint stated no longer than one minute; re-intubated in PACU following cholecystectomy 10/28/13   COPD (chronic obstructive pulmonary disease)    Depression    Diverticulosis    Dysrhythmia    Fibromyalgia    GERD (gastroesophageal reflux disease)    Gout    H/O mitral valve replacement 2002, 2007   Heart murmur  Hemorrhoids    Hyperlipidemia    Hypertension    Hypothyroidism    IBS (irritable bowel syndrome)    MVP (mitral valve prolapse)    NAUSEA AND VOMITING 08/01/2009   Neuromuscular disorder    fibromyalgia   Pacemaker    Peptic ulcer disease     Past Surgical History:  Procedure Laterality Date   ABDOMINAL HYSTERECTOMY     BIV ICD GENERATOR CHANGEOUT N/A 04/29/2017   Procedure: BIV ICD GENERATOR CHANGEOUT;  Surgeon: Thurmon Fair, MD;  Location: [facility] INVASIVE CV LAB;  Service: Cardiovascular;  Laterality: N/A;   CARDIAC CATHETERIZATION  02/25/2006   normal left main,  normal LAD, normal L Cfx, normal/dominant RCA (Dr. Erlene Quan)   CARDIAC DEFIBRILLATOR PLACEMENT  2007, 11/202012   x2 (pacemaker) (Dr. Judie Petit. [place])   CARDIAC VALVE REPLACEMENT  2002   MV repair - Dr. Lisabeth Register   CHOLECYSTECTOMY N/A 10/28/2013   Procedure: LAPAROSCOPIC CHOLECYSTECTOMY WITH INTRAOPERATIVE CHOLANGIOGRAM;  Surgeon: Mariella Saa, MD;  Location: [facility] OR;  Service: General;  Laterality: N/A;   COLONOSCOPY     ENTEROSCOPY N/A 11/09/2021   Procedure: ENTEROSCOPY;  Surgeon: Lemar Lofty., MD;  Location: [facility] ENDOSCOPY;  Service: Gastroenterology;  Laterality: N/A;   ESOPHAGOGASTRODUODENOSCOPY (EGD) WITH PROPOFOL N/A 03/02/2021   Procedure: ESOPHAGOGASTRODUODENOSCOPY (EGD) WITH PROPOFOL;  Surgeon: Jenel Lucks, MD;  Location: [facility] ENDOSCOPY;  Service: Gastroenterology;  Laterality: N/A;   EYE SURGERY     HEMOSTASIS CLIP PLACEMENT  03/02/2021   Procedure: HEMOSTASIS CLIP PLACEMENT;  Surgeon: Jenel Lucks, MD;  Location[facility] ENDOSCOPY;  Service: Gastroenterology;;   HEMOSTASIS CLIP PLACEMENT  11/09/2021   Procedure: HEMOSTASIS CLIP PLACEMENT;  Surgeon: Lemar Lofty., MD;  Location: [facility] ENDOSCOPY;  Service: Gastroenterology;;   HOT HEMOSTASIS  03/02/2021   Procedure: HOT HEMOSTASIS (ARGON PLASMA COAGULATION/BICAP);  Surgeon: Jenel Lucks, MD;  Location[facility] ENDOSCOPY;  Service: Gastroenterology;;   HOT HEMOSTASIS N/A 11/09/2021   Procedure: HOT HEMOSTASIS (ARGON PLASMA COAGULATION/BICAP);  Surgeon: Lemar Lofty., MD;  Location: [facility] ENDOSCOPY;  Service: Gastroenterology;  Laterality: N/A;   IMPLANTABLE CARDIOVERTER DEFIBRILLATOR (ICD) GENERATOR CHANGE N/A 04/10/2011   Procedure: ICD GENERATOR CHANGE;  Surgeon: Thurmon Fair, MD;  Location: [facility] CATH LAB;  Service: Cardiovascular;  Laterality: N/A;   INSERT / REPLACE / REMOVE PACEMAKER     KNEE ARTHROSCOPY     MITRAL VALVE REPLACEMENT  02/26/2006   re-do MVR w/49mm St. [name] (Dr. Wayland Salinas)   NM  MYOCAR PERF WALL MOTION  2005   persantine myoview - low ris, EF 63%   RIGHT HEART CATH  04/03/2006   pulm cap wedge pressure 24/24, PA pressure 43/22 (mean ), CO 4.8, CI 4.1 (Dr. Evlyn Courier)   SCLEROTHERAPY  03/02/2021   Procedure: Susa Day;  Surgeon: Jenel Lucks, MD;  Location: [facility] ENDOSCOPY;  Service: Gastroenterology;;   SUBMUCOSAL TATTOO INJECTION  11/09/2021   Procedure: SUBMUCOSAL TATTOO INJECTION;  Surgeon: Lemar Lofty., MD;  Location: [facility] ENDOSCOPY;  Service: Gastroenterology;;   TOTAL SHOULDER ARTHROPLASTY Right 12/27/2015   Procedure: RIGHT TOTAL SHOULDER ARTHROPLASTY;  Surgeon: Jones Broom, MD;  Location: [facility] OR;  Service: Orthopedics;  Laterality: Right;  Right total shoulder arthroplasty   TOTAL SHOULDER ARTHROPLASTY Left 10/27/2019   Procedure: REVERSE TOTAL SHOULDER ARTHROPLASTY;  Surgeon: Jones Broom, MD;  Location: [facility] ORS;  Service: Orthopedics;  Laterality: Left;   TRANSTHORACIC ECHOCARDIOGRAM  12/2011   EF 50-55%, mild global hypokinesis; LA severely dilated; calcification of anterior/posterior MV leaflets, bi-leaflets  St. [name] mechanical MV; mild TR; trace AV regurg/pulm valve regurg    FAMHx:  Family History  Problem Relation Age of Onset   Breast cancer Sister        multiple   Uterine cancer Sister        multiple   Heart disease Mother    Heart attack Mother    Heart disease Father    Kidney disease Father    Heart attack Father    Heart disease Sister    Crohn's disease Sister    Heart disease Son    Colon cancer Neg Hx    Stomach cancer Neg Hx     SOCHx:   reports that she quit smoking about 21 years ago. Her smoking use included cigarettes. She has never used smokeless tobacco. She reports current alcohol use. She reports that she does not use drugs.  ALLERGIES:  Allergies  Allergen Reactions   Ace Inhibitors Cough   Lipitor [Atorvastatin Calcium] Other (See Comments)    Knee pain    Megace [Megestrol] Other (See  Comments)    High blood pressure and blurred vision   Aspirin Other (See Comments)    Nephrologist said to not take this   Clorazepate Dipotassium Other (See Comments)    Interacts with a drug being taken   Simvastatin Other (See Comments)    Muscle pain   Tiotropium Bromide Monohydrate Other (See Comments)    Dry mouth   Codeine Nausea And Vomiting   Tape Rash    ROS: Pertinent items noted in HPI and remainder of comprehensive ROS otherwise negative.  HOME MEDS: Current Outpatient Medications on File Prior to Visit  Medication Sig Dispense Refill   acetaminophen (TYLENOL) 650 MG CR tablet Take 650 mg by mouth every 8 (eight) hours as needed for pain.     allopurinol (ZYLOPRIM) 300 MG tablet Take 300 mg by mouth every evening.      ALPRAZolam (XANAX) 0.25 MG tablet Take 0.25 mg by mouth at bedtime as needed for anxiety or sleep.      antiseptic oral rinse (BIOTENE) LIQD 15 mLs by Mouth Rinse route as needed for dry mouth.     buPROPion (WELLBUTRIN SR) 150 MG 12 hr tablet Take 150 mg by mouth in the morning.     esomeprazole (NEXIUM) 40 MG capsule TAKE 1 CAPSULE BY MOUTH EVERY DAY BEFORE BREAKFAST 90 capsule 0   fluticasone (FLONASE) 50 MCG/ACT nasal spray Place 1 spray into both nostrils as needed for allergies.     furosemide (LASIX) 20 MG tablet Take 1 tablet (20 mg total) by mouth daily. 30 tablet 0   furosemide (LASIX) 40 MG tablet TAKE 1 TABLET BY MOUTH DAILY. **NEEDS OV FOR FUTURE REFILLS** 90 tablet 2   icosapent Ethyl (VASCEPA) 1 g capsule TAKE 2 CAPSULES BY MOUTH 2 TIMES DAILY. 120 capsule 11   ipratropium (ATROVENT) 0.06 % nasal spray 2 sprays in each nostril Nasally Three times a day for 10 days     levothyroxine (SYNTHROID) 75 MCG tablet Take 75 mcg by mouth daily before breakfast.     memantine (NAMENDA) 5 MG tablet Take 5 mg by mouth every morning.     metoCLOPramide (REGLAN) 5 MG tablet Take 5 mg by mouth 2 (two) times daily as needed for nausea or vomiting.      metoprolol succinate (TOPROL-XL) 50 MG 24 hr tablet Take 1 tablet (50 mg total) by mouth 2 (two) times daily. Take with or immediately following  a meal. (Patient taking differently: Take 75 mg by mouth 2 (two) times daily. Take with or immediately following a meal.) 180 tablet 3   Multiple Vitamin (MULTIVITAMIN WITH MINERALS) TABS tablet Take 1 tablet by mouth daily.     phenylephrine (SUDAFED PE) 10 MG TABS tablet Take 10 mg by mouth at bedtime as needed (Congestion).     pravastatin (PRAVACHOL) 40 MG tablet Take 40 mg at bedtime by mouth.      PROAIR HFA 108 (90 BASE) MCG/ACT inhaler Inhale 1-2 puffs into the lungs every 6 (six) hours as needed for wheezing.     Sennosides (SENNA) 15 MG TABS Take 30 mg by mouth at bedtime.     spironolactone (ALDACTONE) 25 MG tablet Take 1 tablet (25 mg total) by mouth daily. 90 tablet 3   VOLTAREN 1 % GEL Apply 2-4 g topically in the morning and at bedtime.     warfarin (COUMADIN) 2.5 MG tablet TAKE 1 TO 2 TABLETS BY MOUTH DAILY AS DIRECTED BY THE COUMADIN CLINIC 135 tablet 1   Current Facility-Administered Medications on File Prior to Visit  Medication Dose Route Frequency Provider Last Rate Last Admin   0.9 %  sodium chloride infusion  500 mL Intravenous Continuous Hilarie Fredrickson, MD        LABS/IMAGING: No results found for this or any previous visit (from the past 48 hour(s)). No results found.  LIPID PANEL:    Component Value Date/Time   CHOL 198 11/23/2020 1007   TRIG 363 (H) 11/23/2020 1007   HDL 27 (L) 11/23/2020 1007   CHOLHDL 7.3 (H) 11/23/2020 1007   CHOLHDL 8.2 12/09/2006 0410   VLDL 45 (H) 12/09/2006 0410   LDLCALC 108 (H) 11/23/2020 1007    WEIGHTS: Wt Readings from Last 3 Encounters:  09/03/22 93 lb (42.2 kg)  07/10/22 92 lb 12.8 oz (42.1 kg)  07/10/22 94 lb 3.2 oz (42.7 kg)    VITALS: BP (!) 144/72   Pulse 75   Ht 5\' 2"  (1.575 m)   Wt 93 lb (42.2 kg)   BMI 17.01 kg/m    EXAM: Deferred  EKG: Deferred  ASSESSMENT: Familial hypertriglyceridemia History of congestive heart failure status post Bi-V AICD  PLAN: 1.   [name] has had some improvement in her triglycerides although they remain elevated.  Her LDL is also above target.  She is on pravastatin 40 mg daily.  She reports that she has not been too compliant with her Vascepa and often has trouble taking multiple capsules.  I encouraged her to try lower dose 1 g twice daily which should give her some additional benefit.  Will recommend repeat lipids in about 6 months and follow-up with me afterwards.  Chrystie Nose, MD, [facility], FACP  [place]  [facility]  Medical Director of the Advanced Lipid Disorders &  Cardiovascular Risk Reduction Clinic Diplomate of the American Board of Clinical Lipidology Attending Cardiologist  Direct Dial: [phone]  Fax: [phone]  Website:  [email or web address] 09/03/2022, 10:36 AM

## 2022-09-03 NOTE — Telephone Encounter (Signed)
HEALTHWELL ID [id number]   [place]   STATUS Approved   START DATE 08/04/2022   END DATE 08/03/2023   ASSISTANCE TYPE Co-pay  GRANT BALANCE $2500.00  Pharmacy Card CARD NO. [id number]   CARD STATUS Active   BIN [id number]   PCN [id number]   PC GROUP [id number]   PROVIDER PDMI   PROCESSOR PDMI

## 2022-09-03 NOTE — Patient Instructions (Signed)
Medication Instructions:  RESUME vascepa at 1 gram twice daily   *If you need a refill on your cardiac medications before your next appointment, please call your pharmacy*   Lab Work: Lipid Panel and direct LDL today   If you have labs (blood work) drawn today and your tests are completely normal, you will receive your results only by: [place] (if you have MyChart) OR A paper copy in the mail If you have any lab test that is abnormal or we need to change your treatment, we will call you to review the results.    Follow-Up: At [facility], you and your health needs are our priority.  As part of our continuing mission to provide you with exceptional heart care, we have created designated Provider Care Teams.  These Care Teams include your primary Cardiologist (physician) and Advanced Practice Providers (APPs -  Physician Assistants and Nurse Practitioners) who all work together to provide you with the care you need, when you need it.  We recommend signing up for the patient portal called "MyChart".  Sign up information is provided on this After Visit Summary.  MyChart is used to connect with patients for Virtual Visits (Telemedicine).  Patients are able to view lab/test results, encounter notes, upcoming appointments, etc.  Non-urgent messages can be sent to your provider as well.   To learn more about what you can do with MyChart, go to [email or web address].    Your next appointment:    6 months with Dr. Debara Pickett ** call in May/June for an October appointment

## 2022-09-04 ENCOUNTER — Ambulatory Visit: Payer: Medicare Other | Attending: Cardiology | Admitting: *Deleted

## 2022-09-04 DIAGNOSIS — Z952 Presence of prosthetic heart valve: Secondary | ICD-10-CM | POA: Diagnosis not present

## 2022-09-04 DIAGNOSIS — Z7901 Long term (current) use of anticoagulants: Secondary | ICD-10-CM

## 2022-09-04 LAB — LIPID PANEL
Chol/HDL Ratio: 8 ratio — ABNORMAL HIGH (ref 0.0–4.4)
Cholesterol, Total: 273 mg/dL — ABNORMAL HIGH (ref 100–199)
HDL: 34 mg/dL — ABNORMAL LOW (ref >39)
LDL Chol Calc (NIH): 165 mg/dL — ABNORMAL HIGH (ref 0–99)
Triglycerides: 383 mg/dL — ABNORMAL HIGH (ref 0–149)
VLDL Cholesterol Cal: 74 mg/dL — ABNORMAL HIGH (ref 5–40)

## 2022-09-04 LAB — POCT INR: INR: 5.8 — AB (ref 2.0–3.0)

## 2022-09-04 LAB — LDL CHOLESTEROL, DIRECT: LDL Direct: 160 mg/dL — ABNORMAL HIGH (ref 0–99)

## 2022-09-04 NOTE — Patient Instructions (Addendum)
Description   Report to ER with any bleeding, falls, or accidents. Do not take any warfarin today and do not take any warfarin tomorrow then continue taking warfarin 1 tablet daily except for 2 tablets on Sundays, Wednesdays and Fridays. Recheck INR in 1 week. Coumadin Clinic [phone].  Stay consistent with 1 helping of greens per week.

## 2022-09-08 ENCOUNTER — Ambulatory Visit (HOSPITAL_COMMUNITY): Payer: Medicare Other | Attending: Cardiology

## 2022-09-08 DIAGNOSIS — I5042 Chronic combined systolic (congestive) and diastolic (congestive) heart failure: Secondary | ICD-10-CM | POA: Diagnosis not present

## 2022-09-08 LAB — ECHOCARDIOGRAM COMPLETE
Area-P 1/2: 3.26 cm2
MV VTI: 1.31 cm2
P 1/2 time: 379 msec
S' Lateral: 5 cm

## 2022-09-09 ENCOUNTER — Telehealth: Payer: Self-pay

## 2022-09-09 NOTE — Telephone Encounter (Signed)
Yes please, [name]. Thank you

## 2022-09-09 NOTE — Telephone Encounter (Signed)
Spoke with patient. Explained Dr Royann Shivers wanted to check fluid levels on the device report.   Attempted to assist with manual transmission but not unsuccessful. Received error code 5704.  She reports she spoke with [employer] rep 4/8 and will receive a new monitor in 7-10 days.   Advised will inform Dr Royann Shivers that monitor is not currently working and unable to receive remote transmissions.    Dr Royann Shivers, would you like for me to follow up once she receives new monitor?

## 2022-09-10 ENCOUNTER — Ambulatory Visit: Payer: Medicare Other | Attending: Cardiovascular Disease | Admitting: *Deleted

## 2022-09-10 DIAGNOSIS — Z7901 Long term (current) use of anticoagulants: Secondary | ICD-10-CM | POA: Diagnosis not present

## 2022-09-10 DIAGNOSIS — Z952 Presence of prosthetic heart valve: Secondary | ICD-10-CM

## 2022-09-10 LAB — POCT INR: INR: 2.7 (ref 2.0–3.0)

## 2022-09-10 NOTE — Patient Instructions (Signed)
Description   Continue taking warfarin 1 tablet daily except for 2 tablets on Sundays, Wednesdays and Fridays. Recheck INR in 2 weeks. Coumadin Clinic [phone].  Stay consistent with 1 helping of greens per week.

## 2022-09-12 ENCOUNTER — Telehealth: Payer: Self-pay | Admitting: Cardiovascular Disease

## 2022-09-12 ENCOUNTER — Ambulatory Visit (INDEPENDENT_AMBULATORY_CARE_PROVIDER_SITE_OTHER): Payer: Medicare Other

## 2022-09-12 DIAGNOSIS — I480 Paroxysmal atrial fibrillation: Secondary | ICD-10-CM | POA: Diagnosis not present

## 2022-09-12 NOTE — Telephone Encounter (Signed)
Pt states she just received her new device, and is calling for device setup.

## 2022-09-12 NOTE — Telephone Encounter (Signed)
I spoke with the patient and her monitor is working properly. ?

## 2022-09-15 ENCOUNTER — Telehealth: Payer: Self-pay

## 2022-09-15 NOTE — Telephone Encounter (Signed)
Spoke with patient regarding Optivol thoracic impedance as requested by Dr Royann Shivers.   She has chronic leg and ankle swelling it resolves after taking Lasix 20 mg daily.     09/15/2022 Weight: 93 lbs  Copy sent to Dr Royann Shivers for review as he requested.  No further ICM calls planned at this time.  91 day remote transmission monitoring continues.   09/12/2022 Optivol thoracic impedance suggesting intermittent days possible fluid accumulation starting 2/25 and trending back toward baseline on 4/12.

## 2022-09-16 LAB — CUP PACEART REMOTE DEVICE CHECK
Battery Remaining Longevity: 30 mo
Battery Voltage: 2.95 V
Brady Statistic AP VP Percent: 80.38 %
Brady Statistic AP VS Percent: 2.93 %
Brady Statistic AS VP Percent: 16.35 %
Brady Statistic AS VS Percent: 0.34 %
Brady Statistic RA Percent Paced: 83.17 %
Brady Statistic RV Percent Paced: 22.44 %
Date Time Interrogation Session: 20240412162803
HighPow Impedance: 61 Ohm
Implantable Lead Connection Status: 753985
Implantable Lead Connection Status: 753985
Implantable Lead Connection Status: 753985
Implantable Lead Implant Date: 20080221
Implantable Lead Implant Date: 20080221
Implantable Lead Implant Date: 20080221
Implantable Lead Location: 753858
Implantable Lead Location: 753859
Implantable Lead Location: 753860
Implantable Lead Model: 4543
Implantable Lead Model: 5076
Implantable Lead Model: 7002
Implantable Lead Serial Number: 134379
Implantable Pulse Generator Implant Date: 20181128
Lead Channel Impedance Value: 266 Ohm
Lead Channel Impedance Value: 342 Ohm
Lead Channel Impedance Value: 380 Ohm
Lead Channel Impedance Value: 399 Ohm
Lead Channel Impedance Value: 437 Ohm
Lead Channel Impedance Value: 703 Ohm
Lead Channel Pacing Threshold Amplitude: 0.625 V
Lead Channel Pacing Threshold Amplitude: 0.75 V
Lead Channel Pacing Threshold Amplitude: 1.25 V
Lead Channel Pacing Threshold Pulse Width: 0.4 ms
Lead Channel Pacing Threshold Pulse Width: 0.4 ms
Lead Channel Pacing Threshold Pulse Width: 0.4 ms
Lead Channel Sensing Intrinsic Amplitude: 2 mV
Lead Channel Sensing Intrinsic Amplitude: 2 mV
Lead Channel Sensing Intrinsic Amplitude: 29.75 mV
Lead Channel Sensing Intrinsic Amplitude: 29.75 mV
Lead Channel Setting Pacing Amplitude: 1.25 V
Lead Channel Setting Pacing Amplitude: 1.5 V
Lead Channel Setting Pacing Amplitude: 2.5 V
Lead Channel Setting Pacing Pulse Width: 0.4 ms
Lead Channel Setting Pacing Pulse Width: 0.4 ms
Lead Channel Setting Sensing Sensitivity: 1.2 mV
Zone Setting Status: 755011

## 2022-09-19 ENCOUNTER — Telehealth: Payer: Self-pay | Admitting: Cardiovascular Disease

## 2022-09-19 NOTE — Telephone Encounter (Signed)
Patient returned RN's call regarding results. 

## 2022-09-19 NOTE — Telephone Encounter (Signed)
Called patient, advised of results:    [name], [place] 09/15/2022  7:02 AM EDT     Your heart ultrasound showed your EF/squeeze function remains reduced, slightly lower than previously reported. It sounds like Dr. Royann Shivers is checking a manual transmission on your device. How are you feeling?    Patient states she is feeling good. No complaints, she only notices that she get tired easily, but she states she gets a good nice sleep and feels better the next day.   Advised I would notify Angie, PA.   Thanks!

## 2022-09-24 ENCOUNTER — Ambulatory Visit: Payer: Medicare Other | Attending: Cardiovascular Disease | Admitting: Pharmacist

## 2022-09-24 DIAGNOSIS — Z7901 Long term (current) use of anticoagulants: Secondary | ICD-10-CM | POA: Diagnosis not present

## 2022-09-24 DIAGNOSIS — Z952 Presence of prosthetic heart valve: Secondary | ICD-10-CM

## 2022-09-24 LAB — POCT INR: INR: 5.9 — AB (ref 2.0–3.0)

## 2022-09-24 NOTE — Patient Instructions (Addendum)
Description   Hold dose today and tomorrow and then continue taking warfarin 1 tablet daily except for 2 tablets on Sundays, Wednesdays and Fridays. Recheck INR in 1 week. Coumadin Clinic [phone].  Stay consistent with 1 helping of greens per week.

## 2022-09-30 NOTE — Progress Notes (Signed)
Remote ICD transmission.   

## 2022-10-01 ENCOUNTER — Ambulatory Visit: Payer: Medicare Other | Attending: Cardiovascular Disease | Admitting: *Deleted

## 2022-10-01 DIAGNOSIS — Z952 Presence of prosthetic heart valve: Secondary | ICD-10-CM | POA: Diagnosis not present

## 2022-10-01 DIAGNOSIS — Z7901 Long term (current) use of anticoagulants: Secondary | ICD-10-CM

## 2022-10-01 LAB — PROTIME-INR
INR: 7.3 (ref 0.9–1.2)
Prothrombin Time: 66.9 s — ABNORMAL HIGH (ref 9.1–12.0)

## 2022-10-01 LAB — POCT INR: INR: 8 — AB (ref 2.0–3.0)

## 2022-10-01 NOTE — Patient Instructions (Addendum)
Description   DO NOT TAKE ANY WARFARIN TODAY, NO WARFARIN TOMORROW, AND NO WARFARIN FRIDAY THEN START TAKING WARFARIN 1 TABLET DAILY EXCEPT 2 TABLETS ON SUNDAYS.  REPORT TO ER WITH ANY BLEEDING, FALLS, ACCIDENTS Recheck INR in 1 week. Coumadin Clinic [phone].  Stay consistent with 1-2 helping of greens per week.

## 2022-10-03 ENCOUNTER — Other Ambulatory Visit: Payer: Self-pay

## 2022-10-03 ENCOUNTER — Observation Stay (HOSPITAL_COMMUNITY): Payer: Medicare Other

## 2022-10-03 ENCOUNTER — Encounter (HOSPITAL_COMMUNITY): Payer: Self-pay

## 2022-10-03 ENCOUNTER — Emergency Department (HOSPITAL_COMMUNITY): Payer: Medicare Other

## 2022-10-03 ENCOUNTER — Inpatient Hospital Stay (HOSPITAL_COMMUNITY)
Admission: EM | Admit: 2022-10-03 | Discharge: 2022-10-14 | DRG: 286 | Disposition: A | Discharge status: Home / Self Care | Payer: Medicare Other | Attending: Internal Medicine | Admitting: Internal Medicine

## 2022-10-03 DIAGNOSIS — Z86010 Personal history of colonic polyps: Secondary | ICD-10-CM

## 2022-10-03 DIAGNOSIS — Z79899 Other long term (current) drug therapy: Secondary | ICD-10-CM

## 2022-10-03 DIAGNOSIS — R7989 Other specified abnormal findings of blood chemistry: Secondary | ICD-10-CM | POA: Diagnosis not present

## 2022-10-03 DIAGNOSIS — I5043 Acute on chronic combined systolic (congestive) and diastolic (congestive) heart failure: Secondary | ICD-10-CM | POA: Insufficient documentation

## 2022-10-03 DIAGNOSIS — Z8711 Personal history of peptic ulcer disease: Secondary | ICD-10-CM

## 2022-10-03 DIAGNOSIS — Z841 Family history of disorders of kidney and ureter: Secondary | ICD-10-CM

## 2022-10-03 DIAGNOSIS — R7401 Elevation of levels of liver transaminase levels: Secondary | ICD-10-CM

## 2022-10-03 DIAGNOSIS — Z8249 Family history of ischemic heart disease and other diseases of the circulatory system: Secondary | ICD-10-CM

## 2022-10-03 DIAGNOSIS — E039 Hypothyroidism, unspecified: Secondary | ICD-10-CM | POA: Diagnosis present

## 2022-10-03 DIAGNOSIS — Z952 Presence of prosthetic heart valve: Secondary | ICD-10-CM

## 2022-10-03 DIAGNOSIS — D508 Other iron deficiency anemias: Secondary | ICD-10-CM | POA: Diagnosis present

## 2022-10-03 DIAGNOSIS — Z91199 Patient's noncompliance with other medical treatment and regimen due to unspecified reason: Secondary | ICD-10-CM

## 2022-10-03 DIAGNOSIS — R1030 Lower abdominal pain, unspecified: Secondary | ICD-10-CM

## 2022-10-03 DIAGNOSIS — E876 Hypokalemia: Secondary | ICD-10-CM | POA: Diagnosis present

## 2022-10-03 DIAGNOSIS — R636 Underweight: Secondary | ICD-10-CM | POA: Diagnosis present

## 2022-10-03 DIAGNOSIS — N179 Acute kidney failure, unspecified: Secondary | ICD-10-CM | POA: Diagnosis not present

## 2022-10-03 DIAGNOSIS — Z681 Body mass index (BMI) 19 or less, adult: Secondary | ICD-10-CM

## 2022-10-03 DIAGNOSIS — Z7901 Long term (current) use of anticoagulants: Secondary | ICD-10-CM

## 2022-10-03 DIAGNOSIS — R109 Unspecified abdominal pain: Secondary | ICD-10-CM | POA: Diagnosis present

## 2022-10-03 DIAGNOSIS — E782 Mixed hyperlipidemia: Secondary | ICD-10-CM | POA: Diagnosis present

## 2022-10-03 DIAGNOSIS — Z91048 Other nonmedicinal substance allergy status: Secondary | ICD-10-CM

## 2022-10-03 DIAGNOSIS — I130 Hypertensive heart and chronic kidney disease with heart failure and stage 1 through stage 4 chronic kidney disease, or unspecified chronic kidney disease: Secondary | ICD-10-CM | POA: Diagnosis not present

## 2022-10-03 DIAGNOSIS — Z886 Allergy status to analgesic agent status: Secondary | ICD-10-CM

## 2022-10-03 DIAGNOSIS — J4489 Other specified chronic obstructive pulmonary disease: Secondary | ICD-10-CM | POA: Diagnosis present

## 2022-10-03 DIAGNOSIS — Z8049 Family history of malignant neoplasm of other genital organs: Secondary | ICD-10-CM

## 2022-10-03 DIAGNOSIS — Z803 Family history of malignant neoplasm of breast: Secondary | ICD-10-CM

## 2022-10-03 DIAGNOSIS — Z95810 Presence of automatic (implantable) cardiac defibrillator: Secondary | ICD-10-CM | POA: Diagnosis not present

## 2022-10-03 DIAGNOSIS — K5791 Diverticulosis of intestine, part unspecified, without perforation or abscess with bleeding: Secondary | ICD-10-CM | POA: Diagnosis present

## 2022-10-03 DIAGNOSIS — Z90710 Acquired absence of both cervix and uterus: Secondary | ICD-10-CM

## 2022-10-03 DIAGNOSIS — N1832 Chronic kidney disease, stage 3b: Secondary | ICD-10-CM | POA: Diagnosis present

## 2022-10-03 DIAGNOSIS — Z96612 Presence of left artificial shoulder joint: Secondary | ICD-10-CM | POA: Diagnosis present

## 2022-10-03 DIAGNOSIS — F39 Unspecified mood [affective] disorder: Secondary | ICD-10-CM | POA: Diagnosis present

## 2022-10-03 DIAGNOSIS — Z7984 Long term (current) use of oral hypoglycemic drugs: Secondary | ICD-10-CM

## 2022-10-03 DIAGNOSIS — K589 Irritable bowel syndrome without diarrhea: Secondary | ICD-10-CM | POA: Diagnosis present

## 2022-10-03 DIAGNOSIS — Z96611 Presence of right artificial shoulder joint: Secondary | ICD-10-CM | POA: Diagnosis present

## 2022-10-03 DIAGNOSIS — J449 Chronic obstructive pulmonary disease, unspecified: Secondary | ICD-10-CM | POA: Diagnosis present

## 2022-10-03 DIAGNOSIS — I5033 Acute on chronic diastolic (congestive) heart failure: Secondary | ICD-10-CM

## 2022-10-03 DIAGNOSIS — M797 Fibromyalgia: Secondary | ICD-10-CM | POA: Diagnosis present

## 2022-10-03 DIAGNOSIS — M1990 Unspecified osteoarthritis, unspecified site: Secondary | ICD-10-CM | POA: Diagnosis present

## 2022-10-03 DIAGNOSIS — I2510 Atherosclerotic heart disease of native coronary artery without angina pectoris: Secondary | ICD-10-CM | POA: Diagnosis present

## 2022-10-03 DIAGNOSIS — R64 Cachexia: Secondary | ICD-10-CM | POA: Diagnosis present

## 2022-10-03 DIAGNOSIS — R791 Abnormal coagulation profile: Secondary | ICD-10-CM | POA: Diagnosis present

## 2022-10-03 DIAGNOSIS — Z9049 Acquired absence of other specified parts of digestive tract: Secondary | ICD-10-CM

## 2022-10-03 DIAGNOSIS — E871 Hypo-osmolality and hyponatremia: Secondary | ICD-10-CM | POA: Diagnosis present

## 2022-10-03 DIAGNOSIS — Z87891 Personal history of nicotine dependence: Secondary | ICD-10-CM

## 2022-10-03 DIAGNOSIS — Z885 Allergy status to narcotic agent status: Secondary | ICD-10-CM

## 2022-10-03 DIAGNOSIS — G8929 Other chronic pain: Secondary | ICD-10-CM | POA: Diagnosis present

## 2022-10-03 DIAGNOSIS — I10 Essential (primary) hypertension: Secondary | ICD-10-CM | POA: Diagnosis present

## 2022-10-03 DIAGNOSIS — K7460 Unspecified cirrhosis of liver: Secondary | ICD-10-CM | POA: Diagnosis present

## 2022-10-03 DIAGNOSIS — K7200 Acute and subacute hepatic failure without coma: Principal | ICD-10-CM | POA: Diagnosis present

## 2022-10-03 DIAGNOSIS — I480 Paroxysmal atrial fibrillation: Secondary | ICD-10-CM | POA: Diagnosis present

## 2022-10-03 DIAGNOSIS — T391X1A Poisoning by 4-Aminophenol derivatives, accidental (unintentional), initial encounter: Secondary | ICD-10-CM

## 2022-10-03 DIAGNOSIS — Z888 Allergy status to other drugs, medicaments and biological substances status: Secondary | ICD-10-CM

## 2022-10-03 DIAGNOSIS — Z23 Encounter for immunization: Secondary | ICD-10-CM

## 2022-10-03 DIAGNOSIS — Z79890 Hormone replacement therapy (postmenopausal): Secondary | ICD-10-CM

## 2022-10-03 LAB — COMPREHENSIVE METABOLIC PANEL
ALT: 343 U/L — ABNORMAL HIGH (ref 0–44)
AST: 356 U/L — ABNORMAL HIGH (ref 15–41)
Albumin: 3.1 g/dL — ABNORMAL LOW (ref 3.5–5.0)
Alkaline Phosphatase: 142 U/L — ABNORMAL HIGH (ref 38–126)
Anion gap: 13 (ref 5–15)
BUN: 24 mg/dL — ABNORMAL HIGH (ref 8–23)
CO2: 24 mmol/L (ref 22–32)
Calcium: 8.7 mg/dL — ABNORMAL LOW (ref 8.9–10.3)
Chloride: 95 mmol/L — ABNORMAL LOW (ref 98–111)
Creatinine, Ser: 1.41 mg/dL — ABNORMAL HIGH (ref 0.44–1.00)
GFR, Estimated: 38 mL/min — ABNORMAL LOW (ref >60)
Glucose, Bld: 91 mg/dL (ref 70–99)
Potassium: 3.9 mmol/L (ref 3.5–5.1)
Sodium: 132 mmol/L — ABNORMAL LOW (ref 135–145)
Total Bilirubin: 0.9 mg/dL (ref 0.3–1.2)
Total Protein: 6.5 g/dL (ref 6.5–8.1)

## 2022-10-03 LAB — CBC
HCT: 30.8 % — ABNORMAL LOW (ref 36.0–46.0)
Hemoglobin: 9.9 g/dL — ABNORMAL LOW (ref 12.0–15.0)
MCH: 30.6 pg (ref 26.0–34.0)
MCHC: 32.1 g/dL (ref 30.0–36.0)
MCV: 95.1 fL (ref 80.0–100.0)
Platelets: 271 10*3/uL (ref 150–400)
RBC: 3.24 MIL/uL — ABNORMAL LOW (ref 3.87–5.11)
RDW: 14.9 % (ref 11.5–15.5)
WBC: 6.5 10*3/uL (ref 4.0–10.5)
nRBC: 0 % (ref 0.0–0.2)

## 2022-10-03 LAB — TYPE AND SCREEN
ABO/RH(D): O POS
Donor AG Type: NEGATIVE

## 2022-10-03 LAB — HEPATITIS PANEL, ACUTE
HCV Ab: NONREACTIVE
Hep A IgM: NONREACTIVE
Hep B C IgM: NONREACTIVE
Hepatitis B Surface Ag: NONREACTIVE

## 2022-10-03 LAB — RAPID URINE DRUG SCREEN, HOSP PERFORMED
Amphetamines: NOT DETECTED
Barbiturates: NOT DETECTED
Benzodiazepines: NOT DETECTED
Cocaine: NOT DETECTED
Opiates: NOT DETECTED
Tetrahydrocannabinol: NOT DETECTED

## 2022-10-03 LAB — PROTIME-INR
INR: 3.6 — ABNORMAL HIGH (ref 0.8–1.2)
Prothrombin Time: 35.8 seconds — ABNORMAL HIGH (ref 11.4–15.2)

## 2022-10-03 LAB — ACETAMINOPHEN LEVEL: Acetaminophen (Tylenol), Serum: <10 ug/mL — ABNORMAL LOW (ref 10–30)

## 2022-10-03 LAB — BPAM RBC
Blood Product Expiration Date: 202406082359
Blood Product Expiration Date: 202406082359
Unit Type and Rh: 5100
Unit Type and Rh: 5100

## 2022-10-03 LAB — LIPASE, BLOOD: Lipase: 37 U/L (ref 11–51)

## 2022-10-03 LAB — ETHANOL: Alcohol, Ethyl (B): <10 mg/dL (ref <10)

## 2022-10-03 MED ORDER — BUPROPION HCL ER (SR) 150 MG PO TB12
150.0000 mg | ORAL_TABLET | Freq: Every morning | ORAL | Status: DC
  Administered 2022-10-04 – 2022-10-14 (×11): 150 mg via ORAL
  Filled 2022-10-03 (×11): qty 1

## 2022-10-03 MED ORDER — FUROSEMIDE 10 MG/ML IJ SOLN
40.0000 mg | Freq: Every day | INTRAMUSCULAR | Status: DC
  Administered 2022-10-04: 40 mg via INTRAVENOUS
  Filled 2022-10-03: qty 4

## 2022-10-03 MED ORDER — MEMANTINE HCL 10 MG PO TABS
5.0000 mg | ORAL_TABLET | Freq: Every morning | ORAL | Status: DC
  Administered 2022-10-04 – 2022-10-14 (×10): 5 mg via ORAL
  Filled 2022-10-03 (×10): qty 1

## 2022-10-03 MED ORDER — WARFARIN - PHARMACIST DOSING INPATIENT: Freq: Every day | Status: DC

## 2022-10-03 MED ORDER — SPIRONOLACTONE 25 MG PO TABS
25.0000 mg | ORAL_TABLET | Freq: Every day | ORAL | Status: DC
  Administered 2022-10-04 – 2022-10-06 (×3): 25 mg via ORAL
  Filled 2022-10-03 (×3): qty 1

## 2022-10-03 MED ORDER — ALLOPURINOL 300 MG PO TABS
300.0000 mg | ORAL_TABLET | Freq: Every evening | ORAL | Status: DC
  Administered 2022-10-04 – 2022-10-10 (×8): 300 mg via ORAL
  Filled 2022-10-03 (×8): qty 1

## 2022-10-03 MED ORDER — ONDANSETRON 4 MG PO TBDP: 4.0000 mg | ORAL_TABLET | Freq: Once | ORAL | Status: DC

## 2022-10-03 MED ORDER — METOPROLOL SUCCINATE ER 50 MG PO TB24
50.0000 mg | ORAL_TABLET | Freq: Two times a day (BID) | ORAL | Status: DC
  Administered 2022-10-04 – 2022-10-14 (×21): 50 mg via ORAL
  Filled 2022-10-03 (×22): qty 1

## 2022-10-03 MED ORDER — PANTOPRAZOLE SODIUM 40 MG PO TBEC
80.0000 mg | DELAYED_RELEASE_TABLET | Freq: Every day | ORAL | Status: DC
  Administered 2022-10-04 – 2022-10-14 (×11): 80 mg via ORAL
  Filled 2022-10-03 (×11): qty 2

## 2022-10-03 MED ORDER — ENSURE ENLIVE PO LIQD
237.0000 mL | Freq: Two times a day (BID) | ORAL | Status: DC
  Administered 2022-10-04 – 2022-10-13 (×16): 237 mL via ORAL
  Filled 2022-10-03: qty 237

## 2022-10-03 MED ORDER — LEVOTHYROXINE SODIUM 75 MCG PO TABS
75.0000 ug | ORAL_TABLET | Freq: Every day | ORAL | Status: DC
  Administered 2022-10-04 – 2022-10-14 (×10): 75 ug via ORAL
  Filled 2022-10-03 (×10): qty 1

## 2022-10-03 MED ORDER — ICOSAPENT ETHYL 1 G PO CAPS
1.0000 g | ORAL_CAPSULE | Freq: Two times a day (BID) | ORAL | Status: DC
  Administered 2022-10-04 – 2022-10-14 (×21): 1 g via ORAL
  Filled 2022-10-03 (×23): qty 1

## 2022-10-03 MED ORDER — ACETYLCYSTEINE LOAD VIA INFUSION
150.0000 mg/kg | Freq: Once | INTRAVENOUS | Status: DC
  Filled 2022-10-03: qty 206

## 2022-10-03 MED ORDER — ALPRAZOLAM 0.25 MG PO TABS
0.2500 mg | ORAL_TABLET | Freq: Every evening | ORAL | Status: DC | PRN
  Administered 2022-10-04 – 2022-10-13 (×10): 0.25 mg via ORAL
  Filled 2022-10-03 (×10): qty 1

## 2022-10-03 MED ORDER — DEXTROSE 5 % IV SOLN
15.0000 mg/kg/h | INTRAVENOUS | Status: DC
  Administered 2022-10-03: 15 mg/kg/h via INTRAVENOUS
  Filled 2022-10-03: qty 90

## 2022-10-03 MED ORDER — IPRATROPIUM BROMIDE 0.06 % NA SOLN
2.0000 | Freq: Two times a day (BID) | NASAL | Status: DC
  Filled 2022-10-03 (×2): qty 15

## 2022-10-03 MED ORDER — DEXTROSE 5 % IV SOLN
15.0000 mg/kg/h | INTRAVENOUS | Status: DC
  Filled 2022-10-03: qty 90

## 2022-10-03 MED ORDER — ACETYLCYSTEINE LOAD VIA INFUSION
150.0000 mg/kg | Freq: Once | INTRAVENOUS | Status: AC
  Administered 2022-10-03: 6255 mg via INTRAVENOUS
  Filled 2022-10-03: qty 206

## 2022-10-03 NOTE — ED Notes (Signed)
US at bedside

## 2022-10-03 NOTE — ED Notes (Signed)
ED TO INPATIENT HANDOFF REPORT  ED Nurse Name and Phone #: Donny Pique, RN [phone]   S Name/Age/Gender [name] 82 y.o. female Room/Bed: 006C/006C  Code Status   Code Status: Prior  Home/SNF/Other Home Patient oriented to: self, place, time, and situation Is this baseline? Yes   Triage Complete: Triage complete  Chief Complaint Abdominal pain [R10.9]  Triage Note Pt arrived POV w/ c/o being sent from PCP for lower abd pain and Hgb drop by 3 points since Jan when it was 12.1. Per paperwork Hgb done today shows Hgb of 9.7. Pt denies any bloody stools. Pt also reports N/V   Allergies Allergies  Allergen Reactions   Ace Inhibitors Cough   Lipitor [Atorvastatin Calcium] Other (See Comments)    Knee pain    Megace [Megestrol] Other (See Comments)    High blood pressure and blurred vision   Aspirin Other (See Comments)    Nephrologist said to not take this   Clorazepate Dipotassium Other (See Comments)    Interacts with a drug being taken   Simvastatin Other (See Comments)    Muscle pain   Tiotropium Bromide Monohydrate Other (See Comments)    Dry mouth   Codeine Nausea And Vomiting   Tape Rash    Level of Care/Admitting Diagnosis ED Disposition     ED Disposition  Admit   Condition  --   Comment  Hospital Area: [facility] [[id number]]  Level of Care: Telemetry Medical [104]  May place patient in observation at [facility] or [facility] if equivalent level of care is available:: No  Covid Evaluation: Asymptomatic - no recent exposure (last 10 days) testing not required  Diagnosis: Abdominal pain [744753]  Admitting Physician: Anselm Jungling [[id number]]  Attending Physician: Anselm Jungling [[id number]]          B Medical/Surgery History Past Medical History:  Diagnosis Date   Allergy    seasonal   Anemia    Anxiety    Arthritis    Asthma    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 Protecta   Biventricular ICD  (implantable cardioverter-defibrillator) in place    with CRT   Blood transfusion without reported diagnosis    Bursitis    Cataract    RIGHT EYE   CHF (congestive heart failure) (HCC)    Chronic kidney disease    Colon polyp    adenomatous   Complication of anesthesia    patient stated that had difficulty getting the breathing tube removed, patient said that she stopped breathing and HR dropped to 10  patient then woke up and started breathing pateint stated no longer than one minute; re-intubated in PACU following cholecystectomy 10/28/13   COPD (chronic obstructive pulmonary disease) (HCC)    Depression    Diverticulosis    Dysrhythmia    Fibromyalgia    GERD (gastroesophageal reflux disease)    Gout    H/O mitral valve replacement 2002, 2007   Heart murmur    Hemorrhoids    Hyperlipidemia    Hypertension    Hypothyroidism    IBS (irritable bowel syndrome)    MVP (mitral valve prolapse)    NAUSEA AND VOMITING 08/01/2009   Neuromuscular disorder (HCC)    fibromyalgia   Pacemaker    Peptic ulcer disease    Past Surgical History:  Procedure Laterality Date   ABDOMINAL HYSTERECTOMY     BIV ICD GENERATOR CHANGEOUT N/A 04/29/2017   Procedure: BIV ICD GENERATOR  CHANGEOUT;  Surgeon: Thurmon Fair, MD;  Location: [facility] INVASIVE CV LAB;  Service: Cardiovascular;  Laterality: N/A;   CARDIAC CATHETERIZATION  02/25/2006   normal left main, normal LAD, normal L Cfx, normal/dominant RCA (Dr. Erlene Quan)   CARDIAC DEFIBRILLATOR PLACEMENT  2007, 11/202012   x2 (pacemaker) (Dr. Judie Petit. Croitoru)   CARDIAC VALVE REPLACEMENT  2002   MV repair - Dr. Lisabeth Register   CHOLECYSTECTOMY N/A 10/28/2013   Procedure: LAPAROSCOPIC CHOLECYSTECTOMY WITH INTRAOPERATIVE CHOLANGIOGRAM;  Surgeon: Mariella Saa, MD;  Location: [facility] OR;  Service: General;  Laterality: N/A;   COLONOSCOPY     ENTEROSCOPY N/A 11/09/2021   Procedure: ENTEROSCOPY;  Surgeon: Lemar Lofty., MD;  Location: [facility] ENDOSCOPY;   Service: Gastroenterology;  Laterality: N/A;   ESOPHAGOGASTRODUODENOSCOPY (EGD) WITH PROPOFOL N/A 03/02/2021   Procedure: ESOPHAGOGASTRODUODENOSCOPY (EGD) WITH PROPOFOL;  Surgeon: Jenel Lucks, MD;  Location: [facility] ENDOSCOPY;  Service: Gastroenterology;  Laterality: N/A;   EYE SURGERY     HEMOSTASIS CLIP PLACEMENT  03/02/2021   Procedure: HEMOSTASIS CLIP PLACEMENT;  Surgeon: Jenel Lucks, MD;  Location: [facility] ENDOSCOPY;  Service: Gastroenterology;;   HEMOSTASIS CLIP PLACEMENT  11/09/2021   Procedure: HEMOSTASIS CLIP PLACEMENT;  Surgeon: Lemar Lofty., MD;  Location: [facility] ENDOSCOPY;  Service: Gastroenterology;;   HOT HEMOSTASIS  03/02/2021   Procedure: HOT HEMOSTASIS (ARGON PLASMA COAGULATION/BICAP);  Surgeon: Jenel Lucks, MD;  Location: [facility] ENDOSCOPY;  Service: Gastroenterology;;   HOT HEMOSTASIS N/A 11/09/2021   Procedure: HOT HEMOSTASIS (ARGON PLASMA COAGULATION/BICAP);  Surgeon: Lemar Lofty., MD;  Location: [facility] ENDOSCOPY;  Service: Gastroenterology;  Laterality: N/A;   IMPLANTABLE CARDIOVERTER DEFIBRILLATOR (ICD) GENERATOR CHANGE N/A 04/10/2011   Procedure: ICD GENERATOR CHANGE;  Surgeon: Thurmon Fair, MD;  [facility] CATH LAB;  Service: Cardiovascular;  Laterality: N/A;   INSERT / REPLACE / REMOVE PACEMAKER     KNEE ARTHROSCOPY     MITRAL VALVE REPLACEMENT  02/26/2006   re-do MVR w/75mm St. [name] (Dr. Wayland Salinas)   NM MYOCAR PERF WALL MOTION  2005   persantine myoview - low ris, EF 63%   RIGHT HEART CATH  04/03/2006   pulm cap wedge pressure 24/24, PA pressure 43/22 (mean ), CO 4.8, CI 4.1 (Dr. Evlyn Courier)   SCLEROTHERAPY  03/02/2021   Procedure: Susa Day;  Surgeon: Jenel Lucks, MD;  Location: [facility] ENDOSCOPY;  Service: Gastroenterology;;   SUBMUCOSAL TATTOO INJECTION  11/09/2021   Procedure: SUBMUCOSAL TATTOO INJECTION;  Surgeon: Lemar Lofty., MD;  Location: [facility] ENDOSCOPY;  Service: Gastroenterology;;   TOTAL SHOULDER ARTHROPLASTY Right  12/27/2015   Procedure: RIGHT TOTAL SHOULDER ARTHROPLASTY;  Surgeon: Jones Broom, MD;  Location: [facility] OR;  Service: Orthopedics;  Laterality: Right;  Right total shoulder arthroplasty   TOTAL SHOULDER ARTHROPLASTY Left 10/27/2019   Procedure: REVERSE TOTAL SHOULDER ARTHROPLASTY;  Surgeon: Jones Broom, MD;  Location: [facility] ORS;  Service: Orthopedics;  Laterality: Left;   TRANSTHORACIC ECHOCARDIOGRAM  12/2011   EF 50-55%, mild global hypokinesis; LA severely dilated; calcification of anterior/posterior MV leaflets, bi-leaflets St. [name] mechanical MV; mild TR; trace AV regurg/pulm valve regurg     A IV Location/Drains/Wounds Patient Lines/Drains/Airways Status     Active Line/Drains/Airways     Name Placement date Placement time Site Days   Peripheral IV 10/03/22 20 G Anterior;Left;Proximal Forearm 10/03/22  1814  Forearm  less than 1   Incision - 4 Ports Abdomen 1: Umbilicus 2: Upper 3: Right;Lateral 4: Right;Lower 10/28/13  1610  -- 9604  Intake/Output Last 24 hours No intake or output data in the 24 hours ending 10/03/22 2148  Labs/Imaging Results for orders placed or performed during the hospital encounter of 10/03/22 (from the past 48 hour(s))  Comprehensive metabolic panel     Status: Abnormal   Collection Time: 10/03/22  1:32 PM  Result Value Ref Range   Sodium 132 (L) 135 - 145 mmol/L   Potassium 3.9 3.5 - 5.1 mmol/L   Chloride 95 (L) 98 - 111 mmol/L   CO2 24 22 - 32 mmol/L   Glucose, Bld 91 70 - 99 mg/dL    Comment: Glucose reference range applies only to samples taken after fasting for at least 8 hours.   BUN 24 (H) 8 - 23 mg/dL   Creatinine, Ser 1.61 (H) 0.44 - 1.00 mg/dL   Calcium 8.7 (L) 8.9 - 10.3 mg/dL   Total Protein 6.5 6.5 - 8.1 g/dL   Albumin 3.1 (L) 3.5 - 5.0 g/dL   AST 096 (H) 15 - 41 U/L   ALT 343 (H) 0 - 44 U/L   Alkaline Phosphatase 142 (H) 38 - 126 U/L   Total Bilirubin 0.9 0.3 - 1.2 mg/dL   GFR, Estimated 38 (L) >60 mL/min    Comment:  (NOTE) Calculated using the CKD-EPI Creatinine Equation (2021)    Anion gap 13 5 - 15    Comment: Performed at [facility], [address], [place], [place] [address]  CBC     Status: Abnormal   Collection Time: 10/03/22  1:32 PM  Result Value Ref Range   WBC 6.5 4.0 - 10.5 K/uL   RBC 3.24 (L) 3.87 - 5.11 MIL/uL   Hemoglobin 9.9 (L) 12.0 - 15.0 g/dL   HCT 98.1 (L) 19.1 - 47.8 %   MCV 95.1 80.0 - 100.0 fL   MCH 30.6 26.0 - 34.0 pg   MCHC 32.1 30.0 - 36.0 g/dL   RDW 29.5 62.1 - 30.8 %   Platelets 271 150 - 400 K/uL   nRBC 0.0 0.0 - 0.2 %    Comment: Performed at [facility], [address] [place], [place] [address]  Type and screen [facility]     Status: None (Preliminary result)   Collection Time: 10/03/22  1:32 PM  Result Value Ref Range   ABO/RH(D) O POS    Antibody Screen POS    Sample Expiration 10/06/2022,2359    Antibody Identification ANTI E    DAT, IgG POS    Antibody ID,T [place] WARM AUTOANTIBODY    Unit Number [id number]    Blood Component Type RED CELLS,LR    Unit division 00    Status of Unit ALLOCATED    Donor AG Type NEGATIVE FOR E ANTIGEN    Transfusion Status OK TO TRANSFUSE    Crossmatch Result COMPATIBLE    Unit Number [id number]    Blood Component Type RED CELLS,LR    Unit division 00    Status of Unit ALLOCATED    Donor AG Type NEGATIVE FOR E ANTIGEN    Transfusion Status OK TO TRANSFUSE    Crossmatch Result COMPATIBLE   Protime-INR - (order if Patient is taking Coumadin / Warfarin)     Status: Abnormal   Collection Time: 10/03/22  1:32 PM  Result Value Ref Range   Prothrombin Time 35.8 (H) 11.4 - 15.2 seconds   INR 3.6 (H) 0.8 - 1.2    Comment: (NOTE) INR goal varies based on device  and disease states. Performed at [facility], [address], [place], [place] [address]   Lipase, blood     Status: None   Collection Time: 10/03/22  1:32 PM  Result Value Ref Range   Lipase 37 11 - 51 U/L     Comment: Performed at [facility], [address], [place], [place] [address]  Acetaminophen level     Status: Abnormal   Collection Time: 10/03/22  5:32 PM  Result Value Ref Range   Acetaminophen (Tylenol), Serum <10 (L) 10 - 30 ug/mL    Comment: (NOTE) Therapeutic concentrations vary significantly. A range of 10-30 ug/mL  may be an effective concentration for many patients. However, some  are best treated at concentrations outside of this range. Acetaminophen concentrations >150 ug/mL at 4 hours after ingestion  and >50 ug/mL at 12 hours after ingestion are often associated with  toxic reactions.  Performed at [facility], [address], [place], [place] [address]   Ethanol     Status: None   Collection Time: 10/03/22  5:32 PM  Result Value Ref Range   Alcohol, Ethyl (B) <10 <10 mg/dL    Comment: (NOTE) Lowest detectable limit for serum alcohol is 10 mg/dL.  For medical purposes only. Performed at [facility], [address] [place], [place] [address]   Hepatitis panel, acute     Status: None   Collection Time: 10/03/22  6:00 PM  Result Value Ref Range   Hepatitis B Surface Ag NON REACTIVE NON REACTIVE   HCV Ab NON REACTIVE NON REACTIVE    Comment: (NOTE) Nonreactive HCV antibody screen is consistent with no HCV infections,  unless recent infection is suspected or other evidence exists to indicate HCV infection.     Hep A IgM NON REACTIVE NON REACTIVE   Hep B C IgM NON REACTIVE NON REACTIVE    Comment: Performed at [facility], [address], [place], [place] [address]  Rapid urine drug screen (hospital performed)     Status: None   Collection Time: 10/03/22  6:01 PM  Result Value Ref Range   Opiates NONE DETECTED NONE DETECTED   Cocaine NONE DETECTED NONE DETECTED   Benzodiazepines NONE DETECTED NONE DETECTED   Amphetamines NONE DETECTED NONE DETECTED   Tetrahydrocannabinol NONE DETECTED NONE DETECTED   Barbiturates NONE DETECTED NONE  DETECTED    Comment: (NOTE) DRUG SCREEN FOR MEDICAL PURPOSES ONLY.  IF CONFIRMATION IS NEEDED FOR ANY PURPOSE, NOTIFY LAB WITHIN 5 DAYS.  LOWEST DETECTABLE LIMITS FOR URINE DRUG SCREEN Drug Class                     Cutoff (ng/mL) Amphetamine and metabolites    1000 Barbiturate and metabolites    200 Benzodiazepine                 200 Opiates and metabolites        300 Cocaine and metabolites        300 THC                            50 Performed at [facility], [address], [place], [place] [address]    US Abdomen Limited RUQ (LIVER/GB)  Result Date: 10/03/2022 CLINICAL DATA:  Right upper quadrant abdominal pain for 2 weeks EXAM: ULTRASOUND ABDOMEN LIMITED RIGHT UPPER QUADRANT COMPARISON:  CT abdomen pelvis 03/01/2021  FINDINGS: Gallbladder: Status post cholecystectomy Common bile duct: Diameter: 2 mm Liver: Parenchymal echogenicity: Within normal limits Contours: Mildly nodular Lesions: None Portal vein: Patent.  Hepatopetal flow Other: 1.1 and 0.9 cm simple cyst in the upper pole the right kidney do not require dedicated imaging follow-up. Right pleural effusion is partially visualized. IMPRESSION: Nodular hepatic contour, suspicious for cirrhosis. Electronically Signed   By: Acquanetta Belling M.D.   On: 10/03/2022 16:49    Pending Labs Unresulted Labs (From admission, onward)     Start     Ordered   10/03/22 2137  Brain natriuretic peptide  Once,   R        10/03/22 2136            Vitals/Pain Today's Vitals   10/03/22 1311 10/03/22 1645 10/03/22 2100 10/03/22 2103  BP:  (!) 159/77 (!) 166/75   Pulse:  74 88   Resp:  19 (!) 24   Temp:    97.6 F (36.4 C)  TempSrc:    Oral  SpO2:  97% 97%   Weight: 41.7 kg     Height: 5\' 2"  (1.575 m)     PainSc: 5        Isolation Precautions No active isolations  Medications Medications  ondansetron (ZOFRAN-ODT) disintegrating tablet 4 mg (has no administration in time range)  acetylcysteine (ACETADOTE) 30.5 mg/mL load  via infusion 6,255 mg (6,255 mg Intravenous Bolus from Bag 10/03/22 1958)    Followed by  acetylcysteine (ACETADOTE) 18,000 mg in dextrose 5 % 590 mL (30.5085 mg/mL) infusion (15 mg/kg/hr  41.7 kg Intravenous New Bag/Given 10/03/22 1958)    Mobility walks with device   walker  Focused Assessments    R Recommendations: See Admitting Provider Note  Report given to:   Additional Notes: Patient uses a walker to ambulate with no assistance, otherwise she does need a 1 person assist. Patient's husband is at bedside and is somewhat anxious but helpful. Patient swallows meds without problems and has no other liver history until today.

## 2022-10-03 NOTE — Assessment & Plan Note (Signed)
-   Continue beta-blocker and spironolactone

## 2022-10-03 NOTE — Assessment & Plan Note (Signed)
Continue levothyroxine 

## 2022-10-03 NOTE — ED Notes (Signed)
Occult card to bedside

## 2022-10-03 NOTE — Assessment & Plan Note (Signed)
Continue beta blocker. 

## 2022-10-03 NOTE — Assessment & Plan Note (Signed)
-   Stable and not in exacerbation - Continue home bronchodilator

## 2022-10-03 NOTE — ED Provider Notes (Signed)
[facility] AT [facility] Provider Note   CSN: [id number] Arrival date & time: 10/03/22  1236     History  Chief Complaint  Patient presents with   GI Bleeding   Nausea   Emesis    [name] is a 82 y.o. female with past medical history arthritis, COPD, fibromyalgia, GERD, hyperlipidemia, hypertension, IBS, CKD, status post pacemaker placement who presents to the ED complaining of abdominal pain. She complains of one week of suprapubic abdominal pain, nausea, and vomiting.  She states that she has had this many times before and has seen GI several times but has not been diagnosed with any reason for her symptoms. Chart review shows some history of IBS, diverticulosis, GERD, etc. Pt reports she had colonoscopy one year ago that showed diverticular bleeding. She is anticoagulated on Coumadin and regularly gets INR checks. She has not had any flareups of abdominal pain recently.  She denies associated diarrhea, fever, chills, hematemesis, melena, hematochezia, dysuria, hematuria, chest pain, or shortness of breath.  She states that she has vomiting several times per day but is able to tolerate p.o.  She also has pain to both sides of the lower back.  No fall or trauma to the back.  No saddle anesthesia, bowel or bladder dysfunction, dysuria, hematuria, extremity weakness. Prior cholecystectomy but no recent abdominal surgeries. Pt initially seen in triage and on initial assessment in main ED states that her PCP sent her here as she has had a 3.8 hemoglobin dropped since January 2024.  She had lab work completed at PCP office this morning with hemoglobin at 10.1.  Her hemoglobin today in the ED is 9.9.  Reviewed previous labs through epic and we have previous hemoglobin levels in the 10s from January 2024.  Patient denies any evidence of bleeding.  Patient denies known history of elevated LFTs.  Blood work from her PCP this morning shows they are stable.  She  denies history of heavy alcohol use or known exposures to hepatitis. Pt states she is taking Tylenol 650mg  BID as it is the only medication she is allowed to use due to her kidney function and she has diffuse chronic pain secondary to osteoarthritis.       Home Medications Prior to Admission medications   Medication Sig Start Date End Date Taking? Authorizing Provider  acetaminophen (TYLENOL) 650 MG CR tablet Take 650 mg by mouth every 8 (eight) hours as needed for pain.    [provider]  allopurinol (ZYLOPRIM) 300 MG tablet Take 300 mg by mouth every evening.     [provider]  ALPRAZolam Prudy Feeler) 0.25 MG tablet Take 0.25 mg by mouth at bedtime as needed for anxiety or sleep.     [provider]  antiseptic oral rinse (BIOTENE) LIQD 15 mLs by Mouth Rinse route as needed for dry mouth.    [provider]  buPROPion (WELLBUTRIN SR) 150 MG 12 hr tablet Take 150 mg by mouth in the morning.    [provider]  esomeprazole (NEXIUM) 40 MG capsule TAKE 1 CAPSULE BY MOUTH EVERY DAY BEFORE BREAKFAST 09/01/22   Esterwood, Amy S, PA-C  fluticasone (FLONASE) 50 MCG/ACT nasal spray Place 1 spray into both nostrils as needed for allergies.    [provider]  furosemide (LASIX) 20 MG tablet Take 1 tablet (20 mg total) by mouth daily. 06/20/22   Jacalyn Lefevre, MD  furosemide (LASIX) 40 MG tablet TAKE 1 TABLET BY MOUTH DAILY. **  NEEDS OV FOR FUTURE REFILLS** 03/21/21   Runell Gess, MD  icosapent Ethyl (VASCEPA) 1 g capsule Take 1 capsule (1 g total) by mouth 2 (two) times daily. 09/03/22   Hilty, Lisette Abu, MD  ipratropium (ATROVENT) 0.06 % nasal spray 2 sprays in each nostril Nasally Three times a day for 10 days 07/07/22   [provider]  levothyroxine (SYNTHROID) 75 MCG tablet Take 75 mcg by mouth daily before breakfast.    [provider]  memantine (NAMENDA) 5 MG tablet Take 5 mg by mouth every morning. 04/21/22   [provider]  metoCLOPramide (REGLAN) 5 MG tablet Take 5 mg by mouth 2 (two) times daily as needed for nausea or vomiting. 11/05/21   [provider]  metoprolol succinate (TOPROL-XL) 50 MG 24 hr tablet Take 1 tablet (50 mg total) by mouth 2 (two) times daily. Take with or immediately following a meal. Patient taking differently: Take 75 mg by mouth 2 (two) times daily. Take with or immediately following a meal. 12/04/21   Lanier Prude, MD  Multiple Vitamin (MULTIVITAMIN WITH MINERALS) TABS tablet Take 1 tablet by mouth daily.    [provider]  phenylephrine (SUDAFED PE) 10 MG TABS tablet Take 10 mg by mouth at bedtime as needed (Congestion).    [provider]  pravastatin (PRAVACHOL) 40 MG tablet Take 40 mg at bedtime by mouth.     [provider]  PROAIR HFA 108 (90 BASE) MCG/ACT inhaler Inhale 1-2 puffs into the lungs every 6 (six) hours as needed for wheezing. 01/23/15   [provider]  Sennosides (SENNA) 15 MG TABS Take 30 mg by mouth at bedtime.    [provider]  spironolactone (ALDACTONE) 25 MG tablet Take 1 tablet (25 mg total) by mouth daily. 08/19/21   Runell Gess, MD  VOLTAREN 1 % GEL Apply 2-4 g topically in the morning and at bedtime.    [provider]  warfarin (COUMADIN) 2.5 MG tablet TAKE 1 TO 2 TABLETS BY MOUTH DAILY AS DIRECTED BY THE COUMADIN CLINIC 06/19/22   Runell Gess, MD      Allergies    Ace inhibitors, Lipitor [atorvastatin calcium], Megace [megestrol], Aspirin, Clorazepate dipotassium, Simvastatin, Tiotropium bromide monohydrate, Codeine, and Tape    Review of Systems   Review of Systems  All other systems reviewed and are negative.   Physical Exam Updated Vital Signs BP (!) 159/77   Pulse 74   Temp 98.4 F (36.9 C) (Oral)   Resp 19   Ht 5\' 2"  (1.575 m)   Wt 41.7 kg   SpO2 97%   BMI 16.83 kg/m  Physical Exam Vitals and nursing note reviewed.  Constitutional:      General:  She is not in acute distress.    Appearance: Normal appearance. She is not ill-appearing or toxic-appearing.  HENT:     Head: Normocephalic and atraumatic.     Mouth/Throat:     Mouth: Mucous membranes are dry.  Eyes:     General: No scleral icterus.    Extraocular Movements: Extraocular movements intact.     Conjunctiva/sclera: Conjunctivae normal.     Pupils: Pupils are equal, round, and reactive to light.  Cardiovascular:     Rate and Rhythm: Normal rate and regular rhythm.     Heart sounds: No murmur heard. Pulmonary:     Effort: Pulmonary effort is normal. No respiratory distress.     Breath sounds: Normal breath sounds.  No stridor. No wheezing, rhonchi or rales.  Abdominal:     General: Abdomen is flat. There is no distension.     Palpations: Abdomen is soft.     Tenderness: There is abdominal tenderness (mild diffuse). There is no right CVA tenderness, left CVA tenderness, guarding or rebound.  Musculoskeletal:        General: Normal range of motion.     Cervical back: Normal range of motion and neck supple. No rigidity.     Right lower leg: No edema.     Left lower leg: No edema.  Skin:    General: Skin is warm and dry.     Capillary Refill: Capillary refill takes less than 2 seconds.     Findings: No rash.     Comments: Slight jaundice diffusely  Neurological:     Mental Status: She is alert. Mental status is at baseline.  Psychiatric:        Mood and Affect: Mood normal.        Behavior: Behavior normal.     ED Results / Procedures / Treatments   Labs (all labs ordered are listed, but only abnormal results are displayed) Labs Reviewed  COMPREHENSIVE METABOLIC PANEL - Abnormal; Notable for the following components:      Result Value   Sodium 132 (*)    Chloride 95 (*)    BUN 24 (*)    Creatinine, Ser 1.41 (*)    Calcium 8.7 (*)    Albumin 3.1 (*)    AST 356 (*)    ALT 343 (*)    Alkaline Phosphatase 142 (*)    GFR, Estimated 38 (*)    All other  components within normal limits  CBC - Abnormal; Notable for the following components:   RBC 3.24 (*)    Hemoglobin 9.9 (*)    HCT 30.8 (*)    All other components within normal limits  PROTIME-INR - Abnormal; Notable for the following components:   Prothrombin Time 35.8 (*)    INR 3.6 (*)    All other components within normal limits  ACETAMINOPHEN LEVEL - Abnormal; Notable for the following components:   Acetaminophen (Tylenol), Serum <10 (*)    All other components within normal limits  LIPASE, BLOOD  HEPATITIS PANEL, ACUTE  ETHANOL  RAPID URINE DRUG SCREEN, HOSP PERFORMED  TYPE AND SCREEN    EKG None  Radiology US Abdomen Limited RUQ (LIVER/GB)  Result Date: 10/03/2022 CLINICAL DATA:  Right upper quadrant abdominal pain for 2 weeks EXAM: ULTRASOUND ABDOMEN LIMITED RIGHT UPPER QUADRANT COMPARISON:  CT abdomen pelvis 03/01/2021 FINDINGS: Gallbladder: Status post cholecystectomy Common bile duct: Diameter: 2 mm Liver: Parenchymal echogenicity: Within normal limits Contours: Mildly nodular Lesions: None Portal vein: Patent.  Hepatopetal flow Other: 1.1 and 0.9 cm simple cyst in the upper pole the right kidney do not require dedicated imaging follow-up. Right pleural effusion is partially visualized. IMPRESSION: Nodular hepatic contour, suspicious for cirrhosis. Electronically Signed   By: Acquanetta Belling M.D.   On: 10/03/2022 16:49    Procedures .Critical Care  Performed by: Tonette Lederer, PA-C Authorized by: Tonette Lederer, PA-C   Critical care provider statement:    Critical care time (minutes):  45   Critical care start time:  10/03/2022 2:00 PM   Critical care end time:  10/03/2022 7:40 PM   Critical care time was exclusive of:  Separately billable procedures and treating other patients and teaching time   Critical care was necessary to  treat or prevent imminent or life-threatening deterioration of the following conditions:  Cardiac failure, renal failure, circulatory failure,  respiratory failure, sepsis, shock, endocrine crisis, hepatic failure and metabolic crisis   Critical care was time spent personally by me on the following activities:  Development of treatment plan with patient or surrogate, discussions with consultants, evaluation of patient's response to treatment, examination of patient, ordering and review of laboratory studies, ordering and review of radiographic studies, ordering and performing treatments and interventions, pulse oximetry, re-evaluation of patient's condition, review of old charts and obtaining history from patient or surrogate   Care discussed with: admitting provider       Medications Ordered in ED Medications  ondansetron (ZOFRAN-ODT) disintegrating tablet 4 mg (has no administration in time range)  acetylcysteine (ACETADOTE) 30.5 mg/mL load via infusion 6,255 mg (has no administration in time range)    Followed by  acetylcysteine (ACETADOTE) 18,000 mg in dextrose 5 % 590 mL (30.5085 mg/mL) infusion (has no administration in time range)    ED Course/ Medical Decision Making/ A&P Clinical Course as of 10/03/22 1940  Fri Oct 03, 2022  1729 Stable 24 YOF with a chief complaint of NVD Followed by GI HX of Diverticulosis [CC]  1734 Alkaline Phosphatase(!): 142 [CC]  1744 Concern for ALF per presentation. Possible chronic APAP OD.  Consulting GI for thoughts on NAC/Further workup [place] with new cirrhotic morphology Likely admit for stabilization and NAC  [CC]  1748 CRITICAL CARE Performed by: Glyn Ade     Total critical care time: 45 minutes for ALF needing NAC  Critical care time was exclusive of separately billable procedures and treating other patients.  Critical care was necessary to treat or prevent imminent or life-threatening deterioration.  Critical care was time spent personally by me on the following activities: development of treatment plan with patient and/or surrogate as well as nursing, discussions with  consultants, evaluation of patient's response to treatment, examination of patient, obtaining history from patient or surrogate, ordering and performing treatments and interventions, ordering and review of laboratory studies, ordering and review of radiographic studies, pulse oximetry and re-evaluation of patient's condition.  [CC]  1908 APAP level now negative. PC was consulted and based on history recommended 24 hours of treatment and serial LFTs. [CC]    Clinical Course User Index [CC] Glyn Ade, MD                             Medical Decision Making Amount and/or Complexity of Data Reviewed Labs: ordered. Decision-making details documented in ED Course. Radiology: ordered. Decision-making details documented in ED Course. ECG/medicine tests: ordered. Decision-making details documented in ED Course.  Risk Prescription drug management. Decision regarding hospitalization.   Medical Decision Making:   [name] is a 82 y.o. female who presented to the ED today with abdominal pain detailed above.    Additional history discussed with patient's family/caregivers.  Patient's presentation is complicated by their history of advanced age, multiple comorbidities.  Patient placed on continuous vitals and telemetry monitoring while in ED which was reviewed periodically.  Complete initial physical exam performed, notably the patient  was in no acute distress.  She was jaundice.  She did have diffuse abdominal tenderness but abdomen was soft, nondistended, and without peritoneal signs.  Patient neurologically intact.    Reviewed and confirmed nursing documentation for past medical history, family history, social history.    Initial Assessment:   With the patient's  presentation of abdominal pain, differential diagnosis includes but is not limited to AAA, mesenteric ischemia, appendicitis, diverticulitis, DKA, gastritis, gastroenteritis, AMI, nephrolithiasis, pancreatitis,  peritonitis, adrenal insufficiency, intestinal ischemia, constipation, UTI, SBO/LBO, splenic rupture, biliary disease, IBD, IBS, PUD, hepatitis, STD, ovarian/testicular torsion, electrolyte disturbance, DKA, dehydration, acute kidney injury, renal failure, cholecystitis, cholelithiasis, choledocholithiasis, abdominal pain of  unknown etiology.   Initial Plan:  Screening labs including CBC and Metabolic panel to evaluate for infectious or metabolic etiology of disease.  Lipase to evaluate for pancreatitis Urinalysis with reflex culture cancelled as pt had UA this morning which was nitrite and leukocyte negative, moderate ketones present PT-INR due to Coumadin, potential bleeding concern EKG to evaluate for cardiac pathology Symptomatic management. Pt declines IV fluids. She will take nausea medication by mouth and drink PO fluids. Patient declined Hemoccult.  She states that she will follow-up with PCP or GI for this. RUQ [place] for further evaluation of liver Objective evaluation as reviewed   Child-Pugh Score 8  Initial Study Results:   Laboratory  All laboratory results reviewed without evidence of clinically relevant pathology.   Exceptions include: Sodium 132, creatinine 1.41, alk phos 142, albumin 3.1, AST 356, ALT 343, hemoglobin 9.9, INR 3 point states (patient's target is 2.5-3.5)  EKG EKG was reviewed independently. ST segments without concerns for elevations.     Radiology:  All images reviewed independently. Agree with radiology report at this time.   US Abdomen Limited RUQ (LIVER/GB)  Result Date: 10/03/2022 CLINICAL DATA:  Right upper quadrant abdominal pain for 2 weeks EXAM: ULTRASOUND ABDOMEN LIMITED RIGHT UPPER QUADRANT COMPARISON:  CT abdomen pelvis 03/01/2021 FINDINGS: Gallbladder: Status post cholecystectomy Common bile duct: Diameter: 2 mm Liver: Parenchymal echogenicity: Within normal limits Contours: Mildly nodular Lesions: None Portal vein: Patent.  Hepatopetal flow  Other: 1.1 and 0.9 cm simple cyst in the upper pole the right kidney do not require dedicated imaging follow-up. Right pleural effusion is partially visualized. IMPRESSION: Nodular hepatic contour, suspicious for cirrhosis. Electronically Signed   By: Acquanetta Belling M.D.   On: 10/03/2022 16:49   CUP PACEART REMOTE DEVICE CHECK  Result Date: 09/16/2022 Scheduled remote reviewed. Normal device function.  HF diagnostics currently abnormal Next remote 91 days. LA, CVRS  ECHOCARDIOGRAM COMPLETE  Result Date: 09/08/2022    ECHOCARDIOGRAM REPORT   Patient Name:   [name] [facility] Date of Exam: 09/08/2022 Medical Rec #:  [id number]           Height:       62.0 in Accession #:    [id number]          Weight:       93.0 lb Date of Birth:  [birth year]            BSA:          1.381 m Patient Age:    81 years            BP:           144/72 mmHg Patient Gender: F                   HR:           70 bpm. Exam Location:  [address] Procedure: 2D Echo, Cardiac Doppler and Color Doppler Indications:    I50.31 CHF  History:        Patient has prior history of Echocardiogram examinations, most  recent 10/30/2021. CHF, Defibrillator, S/p MVR (25mm [name]);                 Risk Factors:Hypertension and Dyslipidemia.  Sonographer:    Samule Ohm RDCS Referring Phys: [id number] [facility] NICOLE [facility]  Sonographer Comments: Technically difficult study due to poor echo windows. Image acquisition challenging due to patient body habitus. IMPRESSIONS  1. Left ventricular ejection fraction, by estimation, is 20 to 25%. The left ventricle has severely decreased function. The left ventricle demonstrates global hypokinesis. The left ventricular internal cavity size was mildly dilated. Left ventricular diastolic parameters are indeterminate.  2. Right ventricular systolic function is normal. The right ventricular size is normal.  3. Left atrial size was severely dilated.  4. The mitral valve has been repaired/replaced. No evidence  of mitral valve regurgitation. No evidence of mitral stenosis. Echo findings are consistent with normal structure and function of the mitral valve prosthesis.  5. Tricuspid valve regurgitation is moderate.  6. The aortic valve is tricuspid. There is mild calcification of the aortic valve. There is mild thickening of the aortic valve. Aortic valve regurgitation is mild. Aortic valve sclerosis is present, with no evidence of aortic valve stenosis. Aortic regurgitation PHT measures 379 msec.  7. The inferior vena cava is normal in size with greater than 50% respiratory variability, suggesting right atrial pressure of 3 mmHg. Comparison(s): Prior images reviewed side by side. FINDINGS  Left Ventricle: Left ventricular ejection fraction, by estimation, is 20 to 25%. The left ventricle has severely decreased function. The left ventricle demonstrates global hypokinesis. The left ventricular internal cavity size was mildly dilated. There is no left ventricular hypertrophy. Left ventricular diastolic parameters are indeterminate. Right Ventricle: The right ventricular size is normal. No increase in right ventricular wall thickness. Right ventricular systolic function is normal. Left Atrium: Left atrial size was severely dilated. Right Atrium: Right atrial size was normal in size. Pericardium: There is no evidence of pericardial effusion. Mitral Valve: The mitral valve has been repaired/replaced. No evidence of mitral valve regurgitation. There is a 25 mm mechanical valve present in the mitral position. Echo findings are consistent with normal structure and function of the mitral valve prosthesis. No evidence of mitral valve stenosis. MV peak gradient, 9.5 mmHg. The mean mitral valve gradient is 3.0 mmHg. Tricuspid Valve: The tricuspid valve is normal in structure. Tricuspid valve regurgitation is moderate . No evidence of tricuspid stenosis. Aortic Valve: The aortic valve is tricuspid. There is mild calcification of the  aortic valve. There is mild thickening of the aortic valve. Aortic valve regurgitation is mild. Aortic regurgitation PHT measures 379 msec. Aortic valve sclerosis is present,  with no evidence of aortic valve stenosis. Pulmonic Valve: The pulmonic valve was normal in structure. Pulmonic valve regurgitation is mild. No evidence of pulmonic stenosis. Aorta: The aortic root is normal in size and structure. Venous: The inferior vena cava is normal in size with greater than 50% respiratory variability, suggesting right atrial pressure of 3 mmHg. IAS/Shunts: No atrial level shunt detected by color flow Doppler. Additional Comments: A device lead is visualized in the right ventricle.  LEFT VENTRICLE PLAX 2D LVIDd:         5.80 cm   Diastology LVIDs:         5.00 cm   LV e' medial:    6.64 cm/s LV PW:         0.90 cm   LV E/e' medial:  27.1 LV IVS:  0.90 cm   LV e' lateral:   5.00 cm/s LVOT diam:     1.90 cm   LV E/e' lateral: 36.0 LV SV:         35 LV SV Index:   25 LVOT Area:     2.84 cm  RIGHT VENTRICLE            IVC RV S prime:     7.51 cm/s  IVC diam: 0.80 cm TAPSE (M-mode): 1.3 cm RVSP:           29.2 mmHg LEFT ATRIUM             Index        RIGHT ATRIUM           Index LA diam:        4.10 cm 2.97 cm/m   RA Pressure: 3.00 mmHg LA Vol (A2C):   76.7 ml 55.55 ml/m  RA Area:     13.90 cm LA Vol (A4C):   91.6 ml 66.35 ml/m  RA Volume:   35.00 ml  25.35 ml/m LA Biplane Vol: 83.8 ml 60.70 ml/m  AORTIC VALVE LVOT Vmax:   60.60 cm/s LVOT Vmean:  40.900 cm/s LVOT VTI:    0.123 m AI PHT:      379 msec  AORTA Ao Root diam: 3.30 cm Ao Asc diam:  3.10 cm MITRAL VALVE                TRICUSPID VALVE MV Area (PHT): 3.26 cm     TR Peak grad:   26.2 mmHg MV Area VTI:   1.31 cm     TR Vmax:        256.00 cm/s MV Peak grad:  9.5 mmHg     Estimated RAP:  3.00 mmHg MV Mean grad:  3.0 mmHg     RVSP:           29.2 mmHg MV Vmax:       1.54 m/s MV Vmean:      75.3 cm/s    SHUNTS MV Decel Time: 233 msec     Systemic VTI:  0.12  m MV E velocity: 180.00 cm/s  Systemic Diam: 1.90 cm MV A velocity: 44.70 cm/s MV E/A ratio:  4.03 Donato Schultz MD Electronically signed by Donato Schultz MD Signature Date/Time: 09/08/2022/2:38:45 PM    Final       Consults: Case discussed with GI, Dr. Lavon Paganini, who stated she does not believe that patient's liver dysfunction is secondary to acute hepatic failure or due to chronic Tylenol toxicity.  She did not recommend any further workup unless an acute heart failure.  She stated that patient was safe for outpatient follow-up.  Discussed case with Shanda Bumps at poison control who also reviewed the case with their toxicologist.  They believes that as patient has known heart failure lower levels of Tylenol could be toxic to the liver and recommended treating patient for acute hepatic failure with NAC for 4 doses and repeating hepatic panel after 24 hours of observation.  Discussed case with hospitalist, Dr. Cyndia Bent, who believes that patient was safe for outpatient follow-up.  Case was also discussed with hospitalist by attending physician and with this conversation hospitalist agreeable to admit.  Final Assessment and Plan:   82 year old female presents to the ED for 1 week of abdominal pain, nausea, and vomiting.  She has a history of multiple comorbidities including multiple GI conditions.  She states that she has had chronic abdominal  pain in the past but has not had issues with this for some time.  She is slightly jaundiced on exam and has diffuse abdominal tenderness worse on the right side.  No ascites.  Patient neurologically intact. Workup initiated as above for further assessment.  Of note, patient did mention that she was sent by her PCP due to hemoglobin drop of 3 since January.  She has no signs of active bleeding.  Reviewed previous lab work including lab work that was completed in January 2024 and patient has comparable hemoglobin levels.  She does not desire further workup for GI bleed/Hemoccult testing  at today and her hemoglobin appears to be stable.  Patient is not hypotensive and without signs of acute distress.  Workup revealed a new transaminitis and patient with a Child-Pugh score of 8.  Review of previous labs did not show liver dysfunction.  INR is 3.6 but patient is anticoagulated due to prior valve replacement so this is unreliable.  Platelets normal.  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  With this and patient's significant comorbidities, patient appears to be at high risk of morbidity.  Discussed case with attending physician and he was in agreement that patient would most benefit from admission.  Discussed case with GI specialist who believes that patient's lab findings are unlikely to be due to chronic Tylenol toxicity considering the dose that patient reports taking though with multiple conversations with patient this dose keeps changing and she is not completely sure of what dose she is taking.  She denies alcohol use to me but reported some alcohol use to attending physician.  Alcohol level negative.  Pending Tylenol level, acute hepatitis panel but with current findings do believe patient would benefit from admission so case was discussed with poison control as well and in the setting of patient's heart failure and chronic Tylenol use they recommended NAC as above and admission for observation for 24 hours. Attending discussed with hospitalist who will admit patient. NAC initiated. Tylenol level did return and was negative. Pt does still have unexplained new liver failure so admission is warranted. Pt stable at time of disposition.    Clinical Impression:  1. Acute liver failure without hepatic coma   2. Cirrhosis of liver without ascites, unspecified hepatic cirrhosis type (HCC)   3. Accidental acetaminophen overdose, initial encounter   4. Transaminitis      Admit           Final Clinical Impression(s) / ED Diagnoses Final diagnoses:  Acute liver failure without  hepatic coma  Cirrhosis of liver without ascites, unspecified hepatic cirrhosis type (HCC)  Accidental acetaminophen overdose, initial encounter  Transaminitis    Rx / DC Orders ED Discharge Orders     None         Tonette Lederer, PA-C 10/03/22 2251    Glyn Ade, MD 10/03/22 2320

## 2022-10-03 NOTE — ED Provider Triage Note (Signed)
Emergency Medicine Provider Triage Evaluation Note  Kadeejah Rainge , a 82 y.o. female  was evaluated in triage.  Pt complains of one week of suprapubic abdominal pain, nausea, and vomiting.  She states that she has had this many times before and seeing GI several times but has not been diagnosed with any reason for her symptoms.  She has not had any episodes recently.  She denies associated diarrhea, fever, chills, hematemesis, melena, hematochezia, dysuria, hematuria, chest pain, or shortness of breath.  She states that she has vomiting several times per day.  She also has pain to both sides of the lower back.  Prior cholecystectomy but no recent abdominal surgeries.  Review of Systems  Positive: See HPI Negative: See HPI  Physical Exam  BP (!) 166/73 (BP Location: Right Arm)   Pulse 71   Temp 98.4 F (36.9 C) (Oral)   Resp 16   Ht 5\' 2"  (1.575 m)   Wt 41.7 kg   SpO2 96%   BMI 16.83 kg/m  Gen:   Awake, no distress   Resp:  Normal effort  MSK:   Moves extremities without difficulty no lower extremity edema Other:  Abdomen soft, nontender, nondistended, no rebound, guarding, or peritoneal signs, minimal bilateral CVA tenderness  Medical Decision Making  Medically screening exam initiated at 1:24 PM.  Appropriate orders placed.  Yuritzi Gering was informed that the remainder of the evaluation will be completed by another provider, this initial triage assessment does not replace that evaluation, and the importance of remaining in the ED until their evaluation is complete.     Tonette Lederer, PA-C 10/03/22 1325

## 2022-10-03 NOTE — Progress Notes (Signed)
ANTICOAGULATION CONSULT NOTE - Initial Consult  Pharmacy Consult for Warfarin Indication:  mechanical valve  Allergies  Allergen Reactions   Ace Inhibitors Cough   Lipitor [Atorvastatin Calcium] Other (See Comments)    Knee pain    Megace [Megestrol] Other (See Comments)    High blood pressure and blurred vision   Aspirin Other (See Comments)    Nephrologist said to not take this   Clorazepate Dipotassium Other (See Comments)    Interacts with a drug being taken   Simvastatin Other (See Comments)    Muscle pain   Tiotropium Bromide Monohydrate Other (See Comments)    Dry mouth   Codeine Nausea And Vomiting   Tape Rash    Patient Measurements: Height: 5\' 2"  (157.5 cm) Weight: 41.7 kg (92 lb) IBW/kg (Calculated) : 50.1  Vital Signs: Temp: 97.6 F (36.4 C) (05/03 2103) Temp Source: Oral (05/03 2103) BP: 166/75 (05/03 2100) Pulse Rate: 88 (05/03 2100)  Labs: Recent Labs    10/01/22 1143 10/01/22 1200 10/03/22 1332  HGB  --   --  9.9*  HCT  --   --  30.8*  PLT  --   --  271  LABPROT  --  66.9* 35.8*  INR 8.0* 7.3* 3.6*  CREATININE  --   --  1.41*    Estimated Creatinine Clearance: 21.3 mL/min (A) (by C-G formula based on SCr of 1.41 mg/dL (H)).   Medical History: Past Medical History:  Diagnosis Date   Allergy    seasonal   Anemia    Anxiety    Arthritis    Asthma    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 Protecta   Biventricular ICD (implantable cardioverter-defibrillator) in place    with CRT   Blood transfusion without reported diagnosis    Bursitis    Cataract    RIGHT EYE   CHF (congestive heart failure) (HCC)    Chronic kidney disease    Colon polyp    adenomatous   Complication of anesthesia    patient stated that had difficulty getting the breathing tube removed, patient said that she stopped breathing and HR dropped to 10  patient then woke up and started breathing pateint stated no longer than one minute;  re-intubated in PACU following cholecystectomy 10/28/13   COPD (chronic obstructive pulmonary disease) (HCC)    Depression    Diverticulosis    Dysrhythmia    Fibromyalgia    GERD (gastroesophageal reflux disease)    Gout    H/O mitral valve replacement 2002, 2007   Heart murmur    Hemorrhoids    Hyperlipidemia    Hypertension    Hypothyroidism    IBS (irritable bowel syndrome)    MVP (mitral valve prolapse)    NAUSEA AND VOMITING 08/01/2009   Neuromuscular disorder (HCC)    fibromyalgia   Pacemaker    Peptic ulcer disease     Medications:  (Not in a hospital admission)  Scheduled:   allopurinol  300 mg Oral QPM   [START ON 10/04/2022] buPROPion  150 mg Oral q AM   [START ON 10/04/2022] feeding supplement  237 mL Oral BID BM   icosapent Ethyl  1 g Oral BID   ipratropium  2 spray Each Nare BID   [START ON 10/04/2022] levothyroxine  75 mcg Oral QAC breakfast   [START ON 10/04/2022] memantine  5 mg Oral q morning   metoprolol succinate  50 mg Oral BID   ondansetron  4 mg  Oral Once   [START ON 10/04/2022] pantoprazole  80 mg Oral Q1200   [START ON 10/04/2022] spironolactone  25 mg Oral Daily   Infusions:   sodium chloride     acetylcysteine 15 mg/kg/hr (10/03/22 1958)   PRN: ALPRAZolam  Assessment: 81 yof with a history of  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 Patient is presenting with abdominal pain. Warfarin per pharmacy consult placed.  Notably, IV NAC has been started for questionable sub-acute/chronic acetaminophen toxicity concern. Ingestion amount reported has varied throughout hospital course and notable LFT increase.   RUQ ultrasound was performed with findings of nodular hepatic contour suspicious for cirrhosis   Patient taking warfarin prior to arrival. Home dose is 5 mg (2.5 mg x 2) every Sun; 2.5 mg (2.5 mg x 1) all other days. INR was 7.3 on 5/1. Was  instructed to hold 5/1-5/3 then resume usual dose until re-check.  PT / INR today is 35.8 / 3.6, which is borderline supra-therapeutic Hgb 9.9; plt 271  Goal of Therapy:  INR Goal 2.5-3.5 Monitor platelets by anticoagulation protocol: Yes   Plan:  Agree with holding tonight's dose, particularly in the setting of acute LFT rise Monitor for s/s of hemorrhage, daily INR, CBC Watch for new DDIs  [name], PharmD, BCPS 10/03/2022 10:27 PM ED Clinical Pharmacist -  [phone]

## 2022-10-03 NOTE — H&P (Signed)
History and Physical    Patient: [name] ZOX:[id number] DOB: January 14, 1941 DOA: 10/03/2022 DOS: the patient was seen and examined on 10/04/2022 PCP: Garlan Fillers, MD  Patient coming from: Home  Chief Complaint:  Chief Complaint  Patient presents with   GI Bleeding   Nausea   Emesis   HPI: [name] is a 82 y.o. 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  She presents with 1 week of lower cramping abdominal pain.  States she deals with bouts of constipation and diarrhea often.  She had diarrhea earlier this week but the last few days has been constipated.  Finally had a bowel movement yesterday and felt some relief to her abdominal pain.  Did not take any antidiarrheal or laxative/stool softener type medications this week like she usually does.  She denies any dysuria, increased urgency or frequency.  Has felt nauseous and had some vomiting.  She also reports dyspnea this week requiring up to 3 pillows when she sleeps.  Felt her legs have been more edematous.  She takes 20 mg of Lasix daily and missed just 1 dose last night.  Has dry nonproductive cough.  In the ED, she was afebrile mildly hypertensive with SBP of 159.    No leukocytosis, hemoglobin around her baseline 9.9.  Mild hyponatremia with sodium of 132, chloride of 95.  Creatinine of 1.41 close to her baseline. LFTs are elevated with AST of 356, ALT of 343, alkaline phosphatase of 142.  Normal total bilirubin. However no pain to RUQ.  RUQ ultrasound was performed with findings of nodular hepatic contour suspicious for cirrhosis.  She is status postcholecystectomy.  No ductal dilatation.  Due to elevated LFTs, there was initial concern by ED PA of chronic Tylenol toxicity since reportedly pts' account of her Tylenol use was  inconsistent.  Poison control was consulted and recommended starting on Acetylcysteine in ED.   However Tylenol level has since returned <10. Pt and her husband at bedside is certain that she takes the appropriate amount of Tylenol daily. She has 650mg  q8hr on her medication list and reports that she actually cuts this in half and takes at most 3 half-pills daily. Has no other medication on her list that contains Tylenol.   Review of Systems: As mentioned in the history of present illness. All other systems reviewed and are negative. Past Medical History:  Diagnosis Date   Allergy    seasonal   Anemia    Anxiety    Arthritis    Asthma    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 Protecta   Biventricular ICD (implantable cardioverter-defibrillator) in place    with CRT   Blood transfusion without reported diagnosis    Bursitis    Cataract    RIGHT EYE   CHF (congestive heart failure) (HCC)    Chronic kidney disease    Colon polyp    adenomatous   Complication of anesthesia    patient stated that had difficulty getting the breathing tube removed, patient said that she stopped breathing and HR dropped to 10  patient then woke up and started breathing pateint stated no longer than one minute; re-intubated in PACU following cholecystectomy 10/28/13   COPD (chronic obstructive pulmonary disease) (HCC)    Depression    Diverticulosis    Dysrhythmia    Fibromyalgia  GERD (gastroesophageal reflux disease)    Gout    H/O mitral valve replacement 2002, 2007   Heart murmur    Hemorrhoids    Hyperlipidemia    Hypertension    Hypothyroidism    IBS (irritable bowel syndrome)    MVP (mitral valve prolapse)    NAUSEA AND VOMITING 08/01/2009   Neuromuscular disorder (HCC)    fibromyalgia   Pacemaker    Peptic ulcer disease    Past Surgical History:  Procedure Laterality Date   ABDOMINAL HYSTERECTOMY     BIV ICD GENERATOR CHANGEOUT N/A 04/29/2017    Procedure: BIV ICD GENERATOR CHANGEOUT;  Surgeon: Thurmon Fair, MD;  Location: [facility] INVASIVE CV LAB;  Service: Cardiovascular;  Laterality: N/A;   CARDIAC CATHETERIZATION  02/25/2006   normal left main, normal LAD, normal L Cfx, normal/dominant RCA (Dr. Erlene Quan)   CARDIAC DEFIBRILLATOR PLACEMENT  2007, 11/202012   x2 (pacemaker) (Dr. Judie Petit. Croitoru)   CARDIAC VALVE REPLACEMENT  2002   MV repair - Dr. Lisabeth Register   CHOLECYSTECTOMY N/A 10/28/2013   Procedure: LAPAROSCOPIC CHOLECYSTECTOMY WITH INTRAOPERATIVE CHOLANGIOGRAM;  Surgeon: Mariella Saa, MD;  Location: [facility] OR;  Service: General;  Laterality: N/A;   COLONOSCOPY     ENTEROSCOPY N/A 11/09/2021   Procedure: ENTEROSCOPY;  Surgeon: Lemar Lofty., MD;  Location: [facility] ENDOSCOPY;  Service: Gastroenterology;  Laterality: N/A;   ESOPHAGOGASTRODUODENOSCOPY (EGD) WITH PROPOFOL N/A 03/02/2021   Procedure: ESOPHAGOGASTRODUODENOSCOPY (EGD) WITH PROPOFOL;  Surgeon: Jenel Lucks, MD;  Location: [facility] ENDOSCOPY;  Service: Gastroenterology;  Laterality: N/A;   EYE SURGERY     HEMOSTASIS CLIP PLACEMENT  03/02/2021   Procedure: HEMOSTASIS CLIP PLACEMENT;  Surgeon: Jenel Lucks, MD;  Location: [facility] ENDOSCOPY;  Service: Gastroenterology;;   HEMOSTASIS CLIP PLACEMENT  11/09/2021   Procedure: HEMOSTASIS CLIP PLACEMENT;  Surgeon: Lemar Lofty., MD;  Location[facility] ENDOSCOPY;  Service: Gastroenterology;;   HOT HEMOSTASIS  03/02/2021   Procedure: HOT HEMOSTASIS (ARGON PLASMA COAGULATION/BICAP);  Surgeon: Jenel Lucks, MD;  Location: [facility] ENDOSCOPY;  Service: Gastroenterology;;   HOT HEMOSTASIS N/A 11/09/2021   Procedure: HOT HEMOSTASIS (ARGON PLASMA COAGULATION/BICAP);  Surgeon: Lemar Lofty., MD;  Location: [facility] ENDOSCOPY;  Service: Gastroenterology;  Laterality: N/A;   IMPLANTABLE CARDIOVERTER DEFIBRILLATOR (ICD) GENERATOR CHANGE N/A 04/10/2011   Procedure: ICD GENERATOR CHANGE;  Surgeon: Thurmon Fair, MD;  Location: [facility] CATH  LAB;  Service: Cardiovascular;  Laterality: N/A;   INSERT / REPLACE / REMOVE PACEMAKER     KNEE ARTHROSCOPY     MITRAL VALVE REPLACEMENT  02/26/2006   re-do MVR w/36mm St. [name] (Dr. Wayland Salinas)   NM MYOCAR PERF WALL MOTION  2005   persantine myoview - low ris, EF 63%   RIGHT HEART CATH  04/03/2006   pulm cap wedge pressure 24/24, PA pressure 43/22 (mean ), CO 4.8, CI 4.1 (Dr. Evlyn Courier)   SCLEROTHERAPY  03/02/2021   Procedure: Susa Day;  Surgeon: Jenel Lucks, MD;  Location: [facility] ENDOSCOPY;  Service: Gastroenterology;;   SUBMUCOSAL TATTOO INJECTION  11/09/2021   Procedure: SUBMUCOSAL TATTOO INJECTION;  Surgeon: Lemar Lofty., MD;  Location: [facility] ENDOSCOPY;  Service: Gastroenterology;;   TOTAL SHOULDER ARTHROPLASTY Right 12/27/2015   Procedure: RIGHT TOTAL SHOULDER ARTHROPLASTY;  Surgeon: Jones Broom, MD;  Location: [facility] OR;  Service: Orthopedics;  Laterality: Right;  Right total shoulder arthroplasty   TOTAL SHOULDER ARTHROPLASTY Left 10/27/2019   Procedure: REVERSE TOTAL SHOULDER ARTHROPLASTY;  Surgeon: Jones Broom, MD;  Location: [facility] ORS;  Service: Orthopedics;  Laterality: Left;   TRANSTHORACIC ECHOCARDIOGRAM  12/2011   EF 50-55%, mild global hypokinesis; LA severely dilated; calcification of anterior/posterior MV leaflets, bi-leaflets St. [name] mechanical MV; mild TR; trace AV regurg/pulm valve regurg   Social History:  reports that she quit smoking about 21 years ago. Her smoking use included cigarettes. She has never used smokeless tobacco. She reports current alcohol use. She reports that she does not use drugs.  Allergies  Allergen Reactions   Ace Inhibitors Cough   Lipitor [Atorvastatin Calcium] Other (See Comments)    Knee pain    Megace [Megestrol] Other (See Comments)    High blood pressure and blurred vision   Aspirin Other (See Comments)    Nephrologist said to not take this   Clorazepate Dipotassium Other (See Comments)    Interacts with a drug  being taken   Simvastatin Other (See Comments)    Muscle pain   Tiotropium Bromide Monohydrate Other (See Comments)    Dry mouth   Codeine Nausea And Vomiting   Tape Rash    Family History  Problem Relation Age of Onset   Breast cancer Sister        multiple   Uterine cancer Sister        multiple   Heart disease Mother    Heart attack Mother    Heart disease Father    Kidney disease Father    Heart attack Father    Heart disease Sister    Crohn's disease Sister    Heart disease Son    Colon cancer Neg Hx    Stomach cancer Neg Hx     Prior to Admission medications   Medication Sig Start Date End Date Taking? Authorizing Provider  acetaminophen (TYLENOL) 650 MG CR tablet Take 650 mg by mouth every 8 (eight) hours as needed for pain.   Yes [provider]  allopurinol (ZYLOPRIM) 300 MG tablet Take 300 mg by mouth every evening.    Yes [provider]  ALPRAZolam (XANAX) 0.25 MG tablet Take 0.25 mg by mouth at bedtime as needed for anxiety or sleep.    Yes [provider]  antiseptic oral rinse (BIOTENE) LIQD 15 mLs by Mouth Rinse route as needed for dry mouth.   Yes [provider]  buPROPion (WELLBUTRIN SR) 150 MG 12 hr tablet Take 150 mg by mouth in the morning.   Yes [provider]  esomeprazole (NEXIUM) 40 MG capsule TAKE 1 CAPSULE BY MOUTH EVERY DAY BEFORE BREAKFAST Patient taking differently: Take 40 mg by mouth daily before breakfast. 09/01/22  Yes Esterwood, Amy S, PA-C  fluticasone (FLONASE) 50 MCG/ACT nasal spray Place 1 spray into both nostrils as needed for allergies.   Yes [provider]  furosemide (LASIX) 20 MG tablet Take 1 tablet (20 mg total) by mouth daily. 06/20/22  Yes Jacalyn Lefevre, MD  furosemide (LASIX) 40 MG tablet TAKE 1 TABLET BY MOUTH DAILY. **NEEDS OV FOR FUTURE REFILLS** Patient taking differently: Take 20 mg by mouth daily. 03/21/21  Yes Runell Gess, MD  icosapent Ethyl (VASCEPA) 1 g  capsule Take 1 capsule (1 g total) by mouth 2 (two) times daily. 09/03/22  Yes Hilty, Lisette Abu, MD  ipratropium (ATROVENT) 0.06 % nasal spray Place 2 sprays into both nostrils 2 (two) times daily. 07/07/22  Yes [provider]  levothyroxine (SYNTHROID) 75 MCG tablet Take 75 mcg by mouth daily before breakfast.   Yes [provider]  memantine (NAMENDA) 5 MG tablet  Take 5 mg by mouth every morning. 04/21/22  Yes [provider]  metoCLOPramide (REGLAN) 5 MG tablet Take 5 mg by mouth 2 (two) times daily as needed for nausea or vomiting. 11/05/21  Yes [provider]  metoprolol succinate (TOPROL-XL) 50 MG 24 hr tablet Take 1 tablet (50 mg total) by mouth 2 (two) times daily. Take with or immediately following a meal. 12/04/21  Yes Lanier Prude, MD  Multiple Vitamin (MULTIVITAMIN WITH MINERALS) TABS tablet Take 1 tablet by mouth daily.   Yes [provider]  phenylephrine (SUDAFED PE) 10 MG TABS tablet Take 10 mg by mouth at bedtime as needed (Congestion).   Yes [provider]  pravastatin (PRAVACHOL) 40 MG tablet Take 40 mg at bedtime by mouth.    Yes [provider]  Sennosides (SENNA) 15 MG TABS Take 30 mg by mouth at bedtime.   Yes [provider]  spironolactone (ALDACTONE) 25 MG tablet Take 1 tablet (25 mg total) by mouth daily. 08/19/21  Yes Runell Gess, MD  VOLTAREN 1 % GEL Apply 2-4 g topically in the morning and at bedtime.   Yes [provider]  warfarin (COUMADIN) 2.5 MG tablet TAKE 1 TO 2 TABLETS BY MOUTH DAILY AS DIRECTED BY THE COUMADIN CLINIC Patient taking differently: Take 2.5 mg by mouth daily. Take 1 to 2 tablets by mouth daily as directed by the coumadin clinic 06/19/22  Yes Runell Gess, MD  PROAIR HFA 108 (90 BASE) MCG/ACT inhaler Inhale 1-2 puffs into the lungs every 6 (six) hours as needed for wheezing. 01/23/15   [provider]    Physical Exam: Vitals:   10/03/22 2100  10/03/22 2103 10/03/22 2300 10/03/22 2329  BP: (!) 166/75  (!) 165/76 (!) 164/71  Pulse: 88  85 85  Resp: (!) 24  (!) 26 (!) 22  Temp:  97.6 F (36.4 C)  97.9 F (36.6 C)  TempSrc:  Oral  Oral  SpO2: 97%  97% 93%  Weight:      Height:       Constitutional: NAD, calm, comfortable, thin elderly female laying upright at approximately 40 degree incline in bed  eyes: lids and conjunctivae normal ENMT: Mucous membranes are moist.  Neck: normal, supple Respiratory: clear to auscultation bilaterally, no wheezing, no crackles. Normal respiratory effort. No accessory muscle use.  Cardiovascular: Regular rate and rhythm, no murmurs / rubs / gallops. No extremity edema. 2+ pedal pulses.  Abdomen: Soft, mildly distended but no tenderness.  No mass palpated.. Bowel sounds positive.  Musculoskeletal: no clubbing / cyanosis. No joint deformity upper and lower extremities. Normal muscle tone.  Skin: Large areas of ecchymosis around her lower extremities Neurologic: CN 2-12 grossly intact.  Psychiatric: Normal judgment and insight. Alert and oriented x 3. Normal mood. Data Reviewed:  See HPI  Assessment and Plan: * Elevated LFTs -pt started on acetylcysteine infusion in the ED due to concerns of chronic Tylenol toxicity -Patient is certain that she only takes 3 half pills of 650mg  daily. However also started Sudafed recently but reports only taking it at most once a day in the morning.  RUQ ultrasound also shows potential cirrhosis that is unexplained.  She denies any alcohol use. -Acute hepatitis panel is negative -Tylenol level later resulted <10.  Poison control was consulted and toxicology still would like her to continue on acetylcysteine regardless of Tylenol level and wants trending of her LFTs.  Will continue acetylcysteine infusion for 24 hours and  will follow her LFTs in the morning. However still questions the acuity of her abnormal LFTs as her last labs were over a year ago. This could also  be due to vascular congestion from CHF exacerbation -[facility] was also consulted by ED physician and feels her LFTs could be cardiac related. Can re-consult PRN.  -holding home statin  Acute on chronic diastolic CHF (congestive heart failure) (HCC) -reports symptoms of recent orthopnea and LE swelling -CXR also showing vascular congestion -Last echocardiogram in 07/2022 with EF of 20 to 25% and global hypokinesis.  Indeterminate diastolic parameters.  No evidence of mitral valve regurgitation or stenosis status post repair. -will start IV lasix 40mg  daily -follow intake and output, daily weights -likely will need cardiology consult  Abdominal pain -likely due to hx of IBS with alternating diarrhea and constipation. Pt is to lower quadrant/suprapubic region-likely unrelated to her abnormal LFTs and possible cirrhosis seen on RUQ ultrasound.  -Abd X-ray obtained without significant finding or stool burden -awaiting UA results but asymptomatic from urinary stand-point -continue to monitor   COPD (chronic obstructive pulmonary disease) (HCC) - Stable and not in exacerbation - Continue home bronchodilator  Mixed hyperlipidemia -continue Vascepa -holding statin due to elevated LFTs  Hypertension - Continue beta-blocker and spironolactone  Hypothyroidism - Continue levothyroxine  Paroxysmal atrial fibrillation (HCC) - Continue beta-blocker  History of mitral valve replacement with mechanical valve -INR is supratherapeutic today at 3.6 -Goal INR of 2.5-3.5 - Warfarin per pharmacy  Biventricular ICD (implantable cardioverter-defibrillator) in place Noted      Advance Care Planning: Full  Consults: none  Family Communication: husband at bedside   Severity of Illness: The appropriate patient status for this patient is OBSERVATION. Observation status is judged to be reasonable and necessary in order to provide the required intensity of service to ensure the patient's safety.  The patient's presenting symptoms, physical exam findings, and initial radiographic and laboratory data in the context of their medical condition is felt to place them at decreased risk for further clinical deterioration. Furthermore, it is anticipated that the patient will be medically stable for discharge from the hospital within 2 midnights of admission.   Author: Anselm Jungling, DO 10/04/2022 3:34 AM  For on call review [email or web address].

## 2022-10-03 NOTE — Assessment & Plan Note (Signed)
-  continue Vascepa -holding statin due to elevated LFTs

## 2022-10-03 NOTE — Assessment & Plan Note (Signed)
Noted  

## 2022-10-03 NOTE — ED Triage Notes (Addendum)
Pt arrived POV w/ c/o being sent from PCP for lower abd pain and Hgb drop by 3 points since Jan when it was 12.1. Per paperwork Hgb done today shows Hgb of 9.7. Pt denies any bloody stools. Pt also reports N/V

## 2022-10-03 NOTE — Assessment & Plan Note (Signed)
-  INR is supratherapeutic today at 3.6 -Goal INR of 2.5-3.5 - Warfarin per pharmacy

## 2022-10-04 DIAGNOSIS — N179 Acute kidney failure, unspecified: Secondary | ICD-10-CM | POA: Diagnosis not present

## 2022-10-04 DIAGNOSIS — I5043 Acute on chronic combined systolic (congestive) and diastolic (congestive) heart failure: Secondary | ICD-10-CM | POA: Diagnosis present

## 2022-10-04 DIAGNOSIS — Z7901 Long term (current) use of anticoagulants: Secondary | ICD-10-CM

## 2022-10-04 DIAGNOSIS — G8929 Other chronic pain: Secondary | ICD-10-CM | POA: Diagnosis present

## 2022-10-04 DIAGNOSIS — K5791 Diverticulosis of intestine, part unspecified, without perforation or abscess with bleeding: Secondary | ICD-10-CM | POA: Diagnosis present

## 2022-10-04 DIAGNOSIS — D508 Other iron deficiency anemias: Secondary | ICD-10-CM | POA: Diagnosis present

## 2022-10-04 DIAGNOSIS — I5033 Acute on chronic diastolic (congestive) heart failure: Secondary | ICD-10-CM | POA: Diagnosis not present

## 2022-10-04 DIAGNOSIS — Z95810 Presence of automatic (implantable) cardiac defibrillator: Secondary | ICD-10-CM | POA: Diagnosis not present

## 2022-10-04 DIAGNOSIS — E782 Mixed hyperlipidemia: Secondary | ICD-10-CM | POA: Diagnosis present

## 2022-10-04 DIAGNOSIS — I130 Hypertensive heart and chronic kidney disease with heart failure and stage 1 through stage 4 chronic kidney disease, or unspecified chronic kidney disease: Secondary | ICD-10-CM | POA: Diagnosis present

## 2022-10-04 DIAGNOSIS — E871 Hypo-osmolality and hyponatremia: Secondary | ICD-10-CM | POA: Diagnosis present

## 2022-10-04 DIAGNOSIS — Z23 Encounter for immunization: Secondary | ICD-10-CM | POA: Diagnosis present

## 2022-10-04 DIAGNOSIS — Z681 Body mass index (BMI) 19 or less, adult: Secondary | ICD-10-CM | POA: Diagnosis not present

## 2022-10-04 DIAGNOSIS — K7460 Unspecified cirrhosis of liver: Secondary | ICD-10-CM | POA: Diagnosis present

## 2022-10-04 DIAGNOSIS — N1832 Chronic kidney disease, stage 3b: Secondary | ICD-10-CM | POA: Diagnosis present

## 2022-10-04 DIAGNOSIS — J449 Chronic obstructive pulmonary disease, unspecified: Secondary | ICD-10-CM | POA: Diagnosis not present

## 2022-10-04 DIAGNOSIS — Z952 Presence of prosthetic heart valve: Secondary | ICD-10-CM | POA: Diagnosis not present

## 2022-10-04 DIAGNOSIS — R791 Abnormal coagulation profile: Secondary | ICD-10-CM | POA: Diagnosis present

## 2022-10-04 DIAGNOSIS — F39 Unspecified mood [affective] disorder: Secondary | ICD-10-CM | POA: Diagnosis present

## 2022-10-04 DIAGNOSIS — R7989 Other specified abnormal findings of blood chemistry: Secondary | ICD-10-CM | POA: Diagnosis not present

## 2022-10-04 DIAGNOSIS — R7401 Elevation of levels of liver transaminase levels: Secondary | ICD-10-CM | POA: Diagnosis present

## 2022-10-04 DIAGNOSIS — I480 Paroxysmal atrial fibrillation: Secondary | ICD-10-CM | POA: Diagnosis present

## 2022-10-04 DIAGNOSIS — I5023 Acute on chronic systolic (congestive) heart failure: Secondary | ICD-10-CM | POA: Diagnosis not present

## 2022-10-04 DIAGNOSIS — I2510 Atherosclerotic heart disease of native coronary artery without angina pectoris: Secondary | ICD-10-CM | POA: Diagnosis present

## 2022-10-04 DIAGNOSIS — R64 Cachexia: Secondary | ICD-10-CM | POA: Diagnosis present

## 2022-10-04 DIAGNOSIS — K7200 Acute and subacute hepatic failure without coma: Secondary | ICD-10-CM | POA: Diagnosis present

## 2022-10-04 DIAGNOSIS — J4489 Other specified chronic obstructive pulmonary disease: Secondary | ICD-10-CM | POA: Diagnosis present

## 2022-10-04 DIAGNOSIS — E876 Hypokalemia: Secondary | ICD-10-CM | POA: Diagnosis present

## 2022-10-04 DIAGNOSIS — M1990 Unspecified osteoarthritis, unspecified site: Secondary | ICD-10-CM | POA: Diagnosis present

## 2022-10-04 DIAGNOSIS — E785 Hyperlipidemia, unspecified: Secondary | ICD-10-CM | POA: Diagnosis not present

## 2022-10-04 DIAGNOSIS — E039 Hypothyroidism, unspecified: Secondary | ICD-10-CM | POA: Diagnosis not present

## 2022-10-04 LAB — URINALYSIS, ROUTINE W REFLEX MICROSCOPIC
Bacteria, UA: NONE SEEN
Bilirubin Urine: NEGATIVE
Glucose, UA: NEGATIVE mg/dL
Ketones, ur: NEGATIVE mg/dL
Leukocytes,Ua: NEGATIVE
Nitrite: NEGATIVE
Protein, ur: 100 mg/dL — AB
Specific Gravity, Urine: 1.006 (ref 1.005–1.030)
pH: 5 (ref 5.0–8.0)

## 2022-10-04 LAB — COMPREHENSIVE METABOLIC PANEL
ALT: 333 U/L — ABNORMAL HIGH (ref 0–44)
AST: 262 U/L — ABNORMAL HIGH (ref 15–41)
Albumin: 2.6 g/dL — ABNORMAL LOW (ref 3.5–5.0)
Alkaline Phosphatase: 136 U/L — ABNORMAL HIGH (ref 38–126)
Anion gap: 13 (ref 5–15)
BUN: 25 mg/dL — ABNORMAL HIGH (ref 8–23)
CO2: 22 mmol/L (ref 22–32)
Calcium: 8.6 mg/dL — ABNORMAL LOW (ref 8.9–10.3)
Chloride: 96 mmol/L — ABNORMAL LOW (ref 98–111)
Creatinine, Ser: 1.31 mg/dL — ABNORMAL HIGH (ref 0.44–1.00)
GFR, Estimated: 41 mL/min — ABNORMAL LOW (ref >60)
Glucose, Bld: 148 mg/dL — ABNORMAL HIGH (ref 70–99)
Potassium: 4.1 mmol/L (ref 3.5–5.1)
Sodium: 131 mmol/L — ABNORMAL LOW (ref 135–145)
Total Bilirubin: 1.6 mg/dL — ABNORMAL HIGH (ref 0.3–1.2)
Total Protein: 6.2 g/dL — ABNORMAL LOW (ref 6.5–8.1)

## 2022-10-04 LAB — PROTIME-INR
INR: 3.6 — ABNORMAL HIGH (ref 0.8–1.2)
Prothrombin Time: 35.9 seconds — ABNORMAL HIGH (ref 11.4–15.2)

## 2022-10-04 LAB — BRAIN NATRIURETIC PEPTIDE: B Natriuretic Peptide: 3914.7 pg/mL — ABNORMAL HIGH (ref 0.0–100.0)

## 2022-10-04 MED ORDER — WARFARIN SODIUM 1 MG PO TABS
1.0000 mg | ORAL_TABLET | Freq: Once | ORAL | Status: AC
  Administered 2022-10-04: 1 mg via ORAL
  Filled 2022-10-04: qty 1

## 2022-10-04 MED ORDER — FUROSEMIDE 10 MG/ML IJ SOLN
40.0000 mg | Freq: Two times a day (BID) | INTRAMUSCULAR | Status: DC
  Administered 2022-10-04 – 2022-10-05 (×3): 40 mg via INTRAVENOUS
  Filled 2022-10-04 (×3): qty 4

## 2022-10-04 NOTE — Assessment & Plan Note (Addendum)
-  pt started on acetylcysteine infusion in the ED due to concerns of chronic Tylenol toxicity -Patient is certain that she only takes 3 half pills of 650mg  daily. However also started Sudafed recently but reports only taking it at most once a day in the morning.  RUQ ultrasound also shows potential cirrhosis that is unexplained.  She denies any alcohol use. -Acute hepatitis panel is negative -Tylenol level later resulted <10.  Poison control was consulted and toxicology still would like her to continue on acetylcysteine regardless of Tylenol level and wants trending of her LFTs.  Will continue acetylcysteine infusion for 24 hours and will follow her LFTs in the morning. However still questions the acuity of her abnormal LFTs as her last labs were over a year ago. This could also be due to vascular congestion from CHF exacerbation -[facility] was also consulted by ED physician and feels her LFTs could be cardiac related. Can re-consult PRN.  -holding home statin

## 2022-10-04 NOTE — Assessment & Plan Note (Signed)
-  reports symptoms of recent orthopnea and LE swelling -CXR also showing vascular congestion -Last echocardiogram in 07/2022 with EF of 20 to 25% and global hypokinesis.  Indeterminate diastolic parameters.  No evidence of mitral valve regurgitation or stenosis status post repair. -will start IV lasix 40mg  daily -follow intake and output, daily weights -likely will need cardiology consult

## 2022-10-04 NOTE — Consult Note (Signed)
Cardiology Consultation   Patient ID: [name] MRN: [id number]; DOB: [birth year]  Admit date: 10/03/2022 Date of Consult: 10/04/2022  PCP:  Garlan Fillers, MD   [facility] Providers Cardiologist:  Thurmon Fair, MD  Electrophysiologist:  Lanier Prude, MD       Patient Profile:   [name] is a 82 y.o. female with a hx of mechanical mitral valve (repaired 2002, replacement 2007), history of systolic heart failure s/p Medtronic biventricular ICD, hypertension, hyperlipidemia, CKD stage III, PAF, hypothyroidism and COPD who is being seen 10/04/2022 for the evaluation of DOE at the request of Dr. Jerral Ralph.  History of Present Illness:   [name] is a 82 year old female with past medical history of mechanical mitral valve (repaired 2002, replacement 2007), history of systolic heart failure s/p Medtronic biventricular ICD, hypertension, hyperlipidemia, CKD stage III, PAF, hypothyroidism and COPD.  Cardiac catheterization performed on 02/25/2006 prior to mitral valve replacement revealed normal coronary arteries.  Her EF has been as low as 10% but improved significantly after implantation of CRT-D device.  EF improved to 45 to 50% x 2021 and then 2022.  EF felt to 30 to 35% in 2023.  The drop in ejection fraction was felt to be related to reduction in effective biventricular pacing.  Dr. Lalla Brothers increased her beta-blocker to reduce the sinus beats.  She is cachectic with BMI of less than 18.  She was seen in the ED in January 2024 for shortness of breath.  BNP was 2341.  Her symptom improved with IV Lasix.  She has chronic orthopnea and sleeps on 2 pillows.  Repeat echocardiogram obtained on 09/08/2022 demonstrated EF has dropped further down to 20 to 25% was global hypokinesis, severe LAE, moderate TR, mild AI.  We sent CRT-D interrogation on 09/12/2022 demonstrated increasing fluid accumulation since 08/21/2022 based on OptiVol.   Patient presented to [facility] ED yesterday on 10/03/2022 with complaint of abdominal discomfort.  She was noted to have elevated transaminases in the 300 range.  Due to Tylenol usage, there was concern for Tylenol overdose.  She was given a system assisting for reversal, poison control was contacted.  However her Tylenol level came back negative.  GI service was consulted and they felt her elevated transaminase was cardiac in nature due to volume overload.  Talking with the patient, she has been noticing increasing worsening dyspnea on exertion for the past week.  Initial blood work was also significant for creatinine of 1.41, sodium 132.  Hemoglobin 9.9.  Urine drug test negative.  Abdominal x-ray showed gaseous distention of the small bowel without dilatation, no obstruction, small right pleural effusion.  Chest x-ray showed bibasilar airspace disease, likely edema.  His BNP was 3914.7.  Cardiology service consulted for possible heart failure.  Past Medical History:  Diagnosis Date   Allergy    seasonal   Anemia    Anxiety    Arthritis    Asthma    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 Protecta   Biventricular ICD (implantable cardioverter-defibrillator) in place    with CRT   Blood transfusion without reported diagnosis    Bursitis    Cataract    RIGHT EYE   CHF (congestive heart failure) (HCC)    Chronic kidney disease    Colon polyp    adenomatous   Complication of anesthesia    patient stated that had difficulty getting the breathing tube removed, patient said that she stopped  breathing and HR dropped to 10  patient then woke up and started breathing pateint stated no longer than one minute; re-intubated in PACU following cholecystectomy 10/28/13   COPD (chronic obstructive pulmonary disease) (HCC)    Depression    Diverticulosis    Dysrhythmia    Fibromyalgia    GERD (gastroesophageal reflux disease)    Gout    H/O mitral valve replacement 2002, 2007   Heart murmur     Hemorrhoids    Hyperlipidemia    Hypertension    Hypothyroidism    IBS (irritable bowel syndrome)    MVP (mitral valve prolapse)    NAUSEA AND VOMITING 08/01/2009   Neuromuscular disorder (HCC)    fibromyalgia   Pacemaker    Peptic ulcer disease     Past Surgical History:  Procedure Laterality Date   ABDOMINAL HYSTERECTOMY     BIV ICD GENERATOR CHANGEOUT N/A 04/29/2017   Procedure: BIV ICD GENERATOR CHANGEOUT;  Surgeon: Thurmon Fair, MD;  Location: [facility] INVASIVE CV LAB;  Service: Cardiovascular;  Laterality: N/A;   CARDIAC CATHETERIZATION  02/25/2006   normal left main, normal LAD, normal L Cfx, normal/dominant RCA (Dr. Erlene Quan)   CARDIAC DEFIBRILLATOR PLACEMENT  2007, 11/202012   x2 (pacemaker) (Dr. Judie Petit. Croitoru)   CARDIAC VALVE REPLACEMENT  2002   MV repair - Dr. Lisabeth Register   CHOLECYSTECTOMY N/A 10/28/2013   Procedure: LAPAROSCOPIC CHOLECYSTECTOMY WITH INTRAOPERATIVE CHOLANGIOGRAM;  Surgeon: Mariella Saa, MD;  Location: [facility] OR;  Service: General;  Laterality: N/A;   COLONOSCOPY     ENTEROSCOPY N/A 11/09/2021   Procedure: ENTEROSCOPY;  Surgeon: Lemar Lofty., MD;  Location: [facility] ENDOSCOPY;  Service: Gastroenterology;  Laterality: N/A;   ESOPHAGOGASTRODUODENOSCOPY (EGD) WITH PROPOFOL N/A 03/02/2021   Procedure: ESOPHAGOGASTRODUODENOSCOPY (EGD) WITH PROPOFOL;  Surgeon: Jenel Lucks, MD;  Location: [facility] ENDOSCOPY;  Service: Gastroenterology;  Laterality: N/A;   EYE SURGERY     HEMOSTASIS CLIP PLACEMENT  03/02/2021   Procedure: HEMOSTASIS CLIP PLACEMENT;  Surgeon: Jenel Lucks, MD;  Location: [facility] ENDOSCOPY;  Service: Gastroenterology;;   HEMOSTASIS CLIP PLACEMENT  11/09/2021   Procedure: HEMOSTASIS CLIP PLACEMENT;  Surgeon: Lemar Lofty., MD;  Location: [facility] ENDOSCOPY;  Service: Gastroenterology;;   HOT HEMOSTASIS  03/02/2021   Procedure: HOT HEMOSTASIS (ARGON PLASMA COAGULATION/BICAP);  Surgeon: Jenel Lucks, MD;  Location: [facility] ENDOSCOPY;   Service: Gastroenterology;;   HOT HEMOSTASIS N/A 11/09/2021   Procedure: HOT HEMOSTASIS (ARGON PLASMA COAGULATION/BICAP);  Surgeon: Lemar Lofty., MD;  Location: [facility] ENDOSCOPY;  Service: Gastroenterology;  Laterality: N/A;   IMPLANTABLE CARDIOVERTER DEFIBRILLATOR (ICD) GENERATOR CHANGE N/A 04/10/2011   Procedure: ICD GENERATOR CHANGE;  Surgeon: Thurmon Fair, MD;  Location: [facility] CATH LAB;  Service: Cardiovascular;  Laterality: N/A;   INSERT / REPLACE / REMOVE PACEMAKER     KNEE ARTHROSCOPY     MITRAL VALVE REPLACEMENT  02/26/2006   re-do MVR w/57mm St. [name] (Dr. Wayland Salinas)   NM MYOCAR PERF WALL MOTION  2005   persantine myoview - low ris, EF 63%   RIGHT HEART CATH  04/03/2006   pulm cap wedge pressure 24/24, PA pressure 43/22 (mean ), CO 4.8, CI 4.1 (Dr. Evlyn Courier)   SCLEROTHERAPY  03/02/2021   Procedure: Susa Day;  Surgeon: Jenel Lucks, MD;  Location: [facility] ENDOSCOPY;  Service: Gastroenterology;;   SUBMUCOSAL TATTOO INJECTION  11/09/2021   Procedure: SUBMUCOSAL TATTOO INJECTION;  Surgeon: Lemar Lofty., MD;  Location: [facility] ENDOSCOPY;  Service: Gastroenterology;;   TOTAL SHOULDER ARTHROPLASTY Right  12/27/2015   Procedure: RIGHT TOTAL SHOULDER ARTHROPLASTY;  Surgeon: Jones Broom, MD;  Location: [facility] OR;  Service: Orthopedics;  Laterality: Right;  Right total shoulder arthroplasty   TOTAL SHOULDER ARTHROPLASTY Left 10/27/2019   Procedure: REVERSE TOTAL SHOULDER ARTHROPLASTY;  Surgeon: Jones Broom, MD;  Location: [facility] ORS;  Service: Orthopedics;  Laterality: Left;   TRANSTHORACIC ECHOCARDIOGRAM  12/2011   EF 50-55%, mild global hypokinesis; LA severely dilated; calcification of anterior/posterior MV leaflets, bi-leaflets St. [name] mechanical MV; mild TR; trace AV regurg/pulm valve regurg     Home Medications:  Prior to Admission medications   Medication Sig Start Date End Date Taking? Authorizing Provider  acetaminophen (TYLENOL) 650 MG CR tablet Take 650 mg by  mouth every 8 (eight) hours as needed for pain.   Yes [provider]  allopurinol (ZYLOPRIM) 300 MG tablet Take 300 mg by mouth every evening.    Yes [provider]  ALPRAZolam (XANAX) 0.25 MG tablet Take 0.25 mg by mouth at bedtime as needed for anxiety or sleep.    Yes [provider]  antiseptic oral rinse (BIOTENE) LIQD 15 mLs by Mouth Rinse route as needed for dry mouth.   Yes [provider]  buPROPion (WELLBUTRIN SR) 150 MG 12 hr tablet Take 150 mg by mouth in the morning.   Yes [provider]  esomeprazole (NEXIUM) 40 MG capsule TAKE 1 CAPSULE BY MOUTH EVERY DAY BEFORE BREAKFAST Patient taking differently: Take 40 mg by mouth daily before breakfast. 09/01/22  Yes Esterwood, Amy S, PA-C  fluticasone (FLONASE) 50 MCG/ACT nasal spray Place 1 spray into both nostrils as needed for allergies.   Yes [provider]  furosemide (LASIX) 20 MG tablet Take 1 tablet (20 mg total) by mouth daily. 06/20/22  Yes Jacalyn Lefevre, MD  furosemide (LASIX) 40 MG tablet TAKE 1 TABLET BY MOUTH DAILY. **NEEDS OV FOR FUTURE REFILLS** Patient taking differently: Take 20 mg by mouth daily. 03/21/21  Yes Runell Gess, MD  icosapent Ethyl (VASCEPA) 1 g capsule Take 1 capsule (1 g total) by mouth 2 (two) times daily. 09/03/22  Yes Hilty, Lisette Abu, MD  ipratropium (ATROVENT) 0.06 % nasal spray Place 2 sprays into both nostrils 2 (two) times daily. 07/07/22  Yes [provider]  levothyroxine (SYNTHROID) 75 MCG tablet Take 75 mcg by mouth daily before breakfast.   Yes [provider]  memantine (NAMENDA) 5 MG tablet Take 5 mg by mouth every morning. 04/21/22  Yes [provider]  metoCLOPramide (REGLAN) 5 MG tablet Take 5 mg by mouth 2 (two) times daily as needed for nausea or vomiting. 11/05/21  Yes [provider]  metoprolol succinate (TOPROL-XL) 50 MG 24 hr tablet Take 1 tablet (50 mg total) by mouth 2 (two) times daily. Take  with or immediately following a meal. 12/04/21  Yes Lanier Prude, MD  Multiple Vitamin (MULTIVITAMIN WITH MINERALS) TABS tablet Take 1 tablet by mouth daily.   Yes [provider]  phenylephrine (SUDAFED PE) 10 MG TABS tablet Take 10 mg by mouth at bedtime as needed (Congestion).   Yes [provider]  pravastatin (PRAVACHOL) 40 MG tablet Take 40 mg at bedtime by mouth.    Yes [provider]  Sennosides (SENNA) 15 MG TABS Take 30 mg by mouth at bedtime.   Yes [provider]  spironolactone (ALDACTONE) 25 MG tablet Take 1 tablet (25 mg total) by mouth daily. 08/19/21  Yes Runell Gess, MD  VOLTAREN 1 %  GEL Apply 2-4 g topically in the morning and at bedtime.   Yes [provider]  warfarin (COUMADIN) 2.5 MG tablet TAKE 1 TO 2 TABLETS BY MOUTH DAILY AS DIRECTED BY THE COUMADIN CLINIC Patient taking differently: Take 2.5 mg by mouth daily. Take 1 to 2 tablets by mouth daily as directed by the coumadin clinic 06/19/22  Yes Runell Gess, MD  PROAIR HFA 108 (90 BASE) MCG/ACT inhaler Inhale 1-2 puffs into the lungs every 6 (six) hours as needed for wheezing. 01/23/15   [provider]    Inpatient Medications: Scheduled Meds:  allopurinol  300 mg Oral QPM   buPROPion  150 mg Oral q AM   feeding supplement  237 mL Oral BID BM   furosemide  40 mg Intravenous BID   icosapent Ethyl  1 g Oral BID   levothyroxine  75 mcg Oral Q0600   memantine  5 mg Oral q morning   metoprolol succinate  50 mg Oral BID   ondansetron  4 mg Oral Once   pantoprazole  80 mg Oral Q1200   spironolactone  25 mg Oral Daily   warfarin  1 mg Oral ONCE-1600   Warfarin - Pharmacist Dosing Inpatient   Does not apply q1600   Continuous Infusions:  PRN Meds: ALPRAZolam  Allergies:    Allergies  Allergen Reactions   Ace Inhibitors Cough   Lipitor [Atorvastatin Calcium] Other (See Comments)    Knee pain    Megace [Megestrol] Other (See Comments)    High  blood pressure and blurred vision   Aspirin Other (See Comments)    Nephrologist said to not take this   Clorazepate Dipotassium Other (See Comments)    Interacts with a drug being taken   Simvastatin Other (See Comments)    Muscle pain   Tiotropium Bromide Monohydrate Other (See Comments)    Dry mouth   Codeine Nausea And Vomiting   Tape Rash    Social History:   Social History   Socioeconomic History   Marital status: Married    Spouse name: [name]   Number of children: 4   Years of education: 12   Highest education level: Not on file  Occupational History   Occupation: retired  Tobacco Use   Smoking status: Former    Types: Cigarettes    Quit date: 06/02/2001    Years since quitting: 21.3   Smokeless tobacco: Never  Vaping Use   Vaping Use: Never used  Substance and Sexual Activity   Alcohol use: Yes    Alcohol/week: 0.0 standard drinks of alcohol    Comment: social-1 every 6 months   Drug use: No   Sexual activity: Not on file  Other Topics Concern   Not on file  Social History Narrative   Drinks very little caffeine    Social Determinants of Health   Financial Resource Strain: Not on file  Food Insecurity: No Food Insecurity (10/03/2022)   Hunger Vital Sign    Worried About Running Out of Food in the Last Year: Never true    Ran Out of Food in the Last Year: Never true  Transportation Needs: No Transportation Needs (10/03/2022)   PRAPARE - Administrator, Civil Service (Medical): No    Lack of Transportation (Non-Medical): No  Physical Activity: Not on file  Stress: Not on file  Social Connections: Not on file  Intimate Partner Violence: Not At Risk (10/03/2022)   Humiliation, Afraid, Rape, and Kick questionnaire  Fear of Current or Ex-Partner: No    Emotionally Abused: No    Physically Abused: No    Sexually Abused: No    Family History:    Family History  Problem Relation Age of Onset   Breast cancer Sister        multiple   Uterine  cancer Sister        multiple   Heart disease Mother    Heart attack Mother    Heart disease Father    Kidney disease Father    Heart attack Father    Heart disease Sister    Crohn's disease Sister    Heart disease Son    Colon cancer Neg Hx    Stomach cancer Neg Hx      ROS:  Please see the history of present illness.   All other ROS reviewed and negative.     Physical Exam/Data:   Vitals:   10/04/22 1147 10/04/22 1148 10/04/22 1149 10/04/22 1200  BP:      Pulse:      Resp: (!) 27 19 20 20   Temp:  (!) 96.8 F (36 C)  (!) 96.8 F (36 C)  TempSrc:      SpO2:    95%  Weight:      Height:        Intake/Output Summary (Last 24 hours) at 10/04/2022 1415 Last data filed at 10/04/2022 1100 Gross per 24 hour  Intake 480 ml  Output 100 ml  Net 380 ml      10/03/2022    1:11 PM 09/03/2022   10:07 AM 07/10/2022   10:30 AM  Last 3 Weights  Weight (lbs) 92 lb 93 lb 92 lb 12.8 oz  Weight (kg) 41.731 kg 42.185 kg 42.094 kg     Body mass index is 16.83 kg/m.  General:  Well nourished, well developed, in no acute distress HEENT: normal Neck: no JVD Vascular: No carotid bruits; Distal pulses 2+ bilaterally Cardiac:  normal S1, S2; RRR; no murmur  Lungs: Markedly diminished breath sounds in bilateral bases. Abd: soft, nontender, no hepatomegaly  Ext: no edema Musculoskeletal:  No deformities, BUE and BLE strength normal and equal Skin: warm and dry  Neuro:  CNs 2-12 intact, no focal abnormalities noted Psych:  Normal affect   EKG:  The EKG was personally reviewed and demonstrates: Paced rhythm Telemetry:  Telemetry was personally reviewed and demonstrates: Paced rhythm.  Relevant CV Studies:  Echo 09/08/2022  1. Left ventricular ejection fraction, by estimation, is 20 to 25%. The  left ventricle has severely decreased function. The left ventricle  demonstrates global hypokinesis. The left ventricular internal cavity size  was mildly dilated. Left ventricular  diastolic  parameters are indeterminate.   2. Right ventricular systolic function is normal. The right ventricular  size is normal.   3. Left atrial size was severely dilated.   4. The mitral valve has been repaired/replaced. No evidence of mitral  valve regurgitation. No evidence of mitral stenosis. Echo findings are  consistent with normal structure and function of the mitral valve  prosthesis.   5. Tricuspid valve regurgitation is moderate.   6. The aortic valve is tricuspid. There is mild calcification of the  aortic valve. There is mild thickening of the aortic valve. Aortic valve  regurgitation is mild. Aortic valve sclerosis is present, with no evidence  of aortic valve stenosis. Aortic  regurgitation PHT measures 379 msec.   7. The inferior vena cava is normal in size  with greater than 50%  respiratory variability, suggesting right atrial pressure of 3 mmHg.   Comparison(s): Prior images reviewed side by side.   Laboratory Data:  High Sensitivity Troponin:  No results for input(s): "TROPONINIHS" in the last 720 hours.   Chemistry Recent Labs  Lab 10/03/22 1332 10/04/22 0259  NA 132* 131*  K 3.9 4.1  CL 95* 96*  CO2 24 22  GLUCOSE 91 148*  BUN 24* 25*  CREATININE 1.41* 1.31*  CALCIUM 8.7* 8.6*  GFRNONAA 38* 41*  ANIONGAP 13 13    Recent Labs  Lab 10/03/22 1332 10/04/22 0259  PROT 6.5 6.2*  ALBUMIN 3.1* 2.6*  AST 356* 262*  ALT 343* 333*  ALKPHOS 142* 136*  BILITOT 0.9 1.6*   Lipids No results for input(s): "CHOL", "TRIG", "HDL", "LABVLDL", "LDLCALC", "CHOLHDL" in the last 168 hours.  Hematology Recent Labs  Lab 10/03/22 1332  WBC 6.5  RBC 3.24*  HGB 9.9*  HCT 30.8*  MCV 95.1  MCH 30.6  MCHC 32.1  RDW 14.9  PLT 271   Thyroid No results for input(s): "TSH", "FREET4" in the last 168 hours.  BNP Recent Labs  Lab 10/04/22 0259  BNP 3,914.7*    DDimer No results for input(s): "DDIMER" in the last 168 hours.   Radiology/Studies:  DG Abd Portable  2V  Result Date: 10/03/2022 CLINICAL DATA:  Abdominal pain EXAM: PORTABLE ABDOMEN - 2 VIEW COMPARISON:  CT abdomen and pelvis 03/01/2021 FINDINGS: There is gaseous distention of small bowel without dilatation. Air seen throughout the colon. There are no suspicious calcifications. There surgical clips in the right abdomen. The heart is enlarged. There is likely a small right pleural effusion. There are postsurgical changes in the chest. IMPRESSION: 1. Gaseous distention of small bowel without dilatation. No bowel obstruction. 2. Cardiomegaly with small right pleural effusion. Electronically Signed   By: Darliss Cheney M.D.   On: 10/03/2022 22:11   DG Chest 1 View  Result Date: 10/03/2022 CLINICAL DATA:  Shortness of breath, abdominal pain EXAM: CHEST  1 VIEW COMPARISON:  06/20/2022 FINDINGS: Left AICD remains in place, unchanged. Prior median sternotomy and valve replacement. Cardiomegaly, vascular congestion. Bilateral airspace disease, favor edema. No effusions or acute bony abnormality. IMPRESSION: Cardiomegaly with bilateral airspace disease, likely edema. Electronically Signed   By: Charlett Nose M.D.   On: 10/03/2022 22:09   US Abdomen Limited RUQ (LIVER/GB)  Result Date: 10/03/2022 CLINICAL DATA:  Right upper quadrant abdominal pain for 2 weeks EXAM: ULTRASOUND ABDOMEN LIMITED RIGHT UPPER QUADRANT COMPARISON:  CT abdomen pelvis 03/01/2021 FINDINGS: Gallbladder: Status post cholecystectomy Common bile duct: Diameter: 2 mm Liver: Parenchymal echogenicity: Within normal limits Contours: Mildly nodular Lesions: None Portal vein: Patent.  Hepatopetal flow Other: 1.1 and 0.9 cm simple cyst in the upper pole the right kidney do not require dedicated imaging follow-up. Right pleural effusion is partially visualized. IMPRESSION: Nodular hepatic contour, suspicious for cirrhosis. Electronically Signed   By: Acquanetta Belling M.D.   On: 10/03/2022 16:49     Assessment and Plan:   Acute on chronic systolic heart  failure  -Patient has a EF as low as 10% at some point in the past, however EF went up to 45 to 50% after CRT-D.  Repeat echocardiogram in May 2023 showed EF dropped down to 30 to 35%.  This was felt to be related to reduction in effective biventricular pacing.  Beta-blocker was increased.  Biventricular pacing has increased to 96% on the most recent interrogation.  Unfortunately EF further dropped down to the 20 to 25% on most recent echocardiogram on 09/08/2022.  Patient does complain of chest tightness with physical exertion.  -Recent OptiVol reading also indicated of volume accumulation.  Will continue IV diuresis.  Consider left and right heart cath once she is euvolemic  History of mechanical mitral valve replacement 2007: Stable on last echocardiogram  History of Medtronic biventricular ICD: On Coumadin  Hypertension: Blood pressure well-controlled.  Continue metoprolol succinate and spironolactone  Hyperlipidemia: On Vascepa  CKD stage III  COPD  PAF: On Coumadin.   Risk Assessment/Risk Scores:        New York Heart Association (NYHA) Functional Class NYHA Class IV        For questions or updates, please contact [facility] Please consult [email or web address] for contact info under    Signed, Azalee Course, [place]  10/04/2022 2:15 PM

## 2022-10-04 NOTE — Progress Notes (Signed)
Called [facility] Control and updated with AST 262, ALT 333, acetaminophen level <10, total bilirubin 1.6, and most recent vitals. Okay to stop NAC per poison control, no further lab monitoring from their standpoint. Informed MD.   Georga Hacking, Pharm.D PGY1 Pharmacy Resident 10/04/2022 9:58 AM

## 2022-10-04 NOTE — Progress Notes (Addendum)
PROGRESS NOTE        PATIENT DETAILS Name: [name] Age: 82 y.o. Sex: female Date of Birth: [birth year] Admit Date: 10/03/2022 Admitting Physician Anselm Jungling, DO BJY:[id number], Barry Dienes, MD  Brief Summary: Patient is a 82 y.o.  female with history of HFrEF, mitral valve replacement-mechanical valve on Coumadin, COPD, HTN, PAF-who presented with abdominal pain with worsening orthopnea and exertional dyspnea x 7 days--found to have elevated transaminases-with concern for either congestive hepatitis or Tylenol toxicity-started on Mucomyst infusion after discussion with poison control and admitted to the hospitalist service.   Significant events: 5/3>> admit to [facility]  Significant studies: 5/3>> RUQ ultrasound: S/p cholecystectomy-nodular hepatic contour suspicious for cirrhosis 5/3>> CXR: Bilateral airspace disease-likely pulm edema 5/3>> x-ray abdomen: No bowel obstruction 5/3>> acute hepatitis serology: Negative 5/3>> lipase: Negative 5/3>> Tylenol level:<10  Significant microbiology data: None  Procedures: None  Consults: Cardiology  Subjective: Lying comfortably in bed-denies any chest pain or shortness of breath.  Abdominal pain has essentially resolved.  Objective: Vitals: Blood pressure 122/79, pulse 79, temperature 97.9 F (36.6 C), temperature source Oral, resp. rate 18, height 5\' 2"  (1.575 m), weight 41.7 kg, SpO2 93 %.   Exam: Gen Exam:Alert awake-not in any distress HEENT:atraumatic, normocephalic Chest: Few bibasilar rales CVS:S1S2 regular Abdomen:soft non tender, non distended Extremities:no edema Neurology: Non focal Skin: no rash  Pertinent Labs/Radiology:    Latest Ref Rng & Units 10/03/2022    1:32 PM 06/20/2022    4:21 PM 06/20/2022    4:19 PM  CBC  WBC 4.0 - 10.5 K/uL 6.5  7.8    Hemoglobin 12.0 - 15.0 g/dL 9.9  30.8  65.7   Hematocrit 36.0 - 46.0 % 30.8  31.9  30.0   Platelets 150 - 400 K/uL 271  178      Lab  Results  Component Value Date   NA 131 (L) 10/04/2022   K 4.1 10/04/2022   CL 96 (L) 10/04/2022   CO2 22 10/04/2022      Assessment/Plan: Elevated transaminases Unclear etiology-suspicion that this may be from congestive hepatitis/cardiac cirrhosis superimposed on some amount of Tylenol toxicity.  Acute hepatitis serology negative..  She is status postcholecystectomy. LFTs stabilizing-and downtrending-remains on Mucomyst infusion per pharmacy protocol-per poison control recommendations.  Remains on IV diuretics as well. Note-GI was consulted by EDP-thought to have congestive hepatitis-recommendations were for supportive care Continue to hold statins-and avoid hepatotoxic agents Follow LFTs  Thankfully abdominal pain has resolved-ultrasound imaging not suspicious for biliary pathology.  Lipase levels were within normal limit.  Addendum Received epic chat message from Amy Esterwood-GI-PA-C-chart reviewed by GI service-congestive hepatopathy-nothing much to do from GI perspective.  Acute on chronic HFrEF History of AICD placement 2018 No peripheral signs of volume overload-but CXR with pulm edema-significantly elevated BNP levels.  On IV diuretics Cardiology consulted for assistance-especially due to concern for congestive hepatitis  PAF Beta-blocker Coumadin for pharmacy-INR therapeutic  Mechanical mitral valve replacement 2002 Goal INR 2.5-3.5 Coumadin per pharmacy  CKD stage IIIb At baseline  COPD Not in exacerbation Bronchodilators  HTN BP stable Beta-blocker/Aldactone/Lasix  Hypothyroidism Levothyroxine  Mood disorder Appears stable Wellbutrin/Xanax  Underweight: Estimated body mass index is 16.83 kg/m as calculated from the following:   Height as of this encounter: 5\' 2"  (1.575 m).   Weight as of this encounter: 41.7 kg.   Code  status:   Code Status: Prior   DVT Prophylaxis: warfarin (COUMADIN) tablet 1 mg    Family Communication: None at  bedside   Disposition Plan: Status is: Observation The patient will require care spanning > 2 midnights and should be moved to inpatient because: Severity of illness   Planned Discharge Destination:Home   Diet: Diet Order             Diet Heart Room service appropriate? Yes; Fluid consistency: Thin  Diet effective now                     Antimicrobial agents: Anti-infectives (From admission, onward)    None        MEDICATIONS: Scheduled Meds:  allopurinol  300 mg Oral QPM   buPROPion  150 mg Oral q AM   feeding supplement  237 mL Oral BID BM   furosemide  40 mg Intravenous BID   icosapent Ethyl  1 g Oral BID   levothyroxine  75 mcg Oral Q0600   memantine  5 mg Oral q morning   metoprolol succinate  50 mg Oral BID   ondansetron  4 mg Oral Once   pantoprazole  80 mg Oral Q1200   spironolactone  25 mg Oral Daily   warfarin  1 mg Oral ONCE-1600   Warfarin - Pharmacist Dosing Inpatient   Does not apply q1600   Continuous Infusions:  acetylcysteine 15 mg/kg/hr (10/03/22 1958)   PRN Meds:.ALPRAZolam   I have personally reviewed following labs and imaging studies  LABORATORY DATA: CBC: Recent Labs  Lab 10/03/22 1332  WBC 6.5  HGB 9.9*  HCT 30.8*  MCV 95.1  PLT 271    Basic Metabolic Panel: Recent Labs  Lab 10/03/22 1332 10/04/22 0259  NA 132* 131*  K 3.9 4.1  CL 95* 96*  CO2 24 22  GLUCOSE 91 148*  BUN 24* 25*  CREATININE 1.41* 1.31*  CALCIUM 8.7* 8.6*    GFR: Estimated Creatinine Clearance: 22.9 mL/min (A) (by C-G formula based on SCr of 1.31 mg/dL (H)).  Liver Function Tests: Recent Labs  Lab 10/03/22 1332 10/04/22 0259  AST 356* 262*  ALT 343* 333*  ALKPHOS 142* 136*  BILITOT 0.9 1.6*  PROT 6.5 6.2*  ALBUMIN 3.1* 2.6*   Recent Labs  Lab 10/03/22 1332  LIPASE 37   No results for input(s): "AMMONIA" in the last 168 hours.  Coagulation Profile: Recent Labs  Lab 10/01/22 1143 10/01/22 1200 10/03/22 1332  10/04/22 0740  INR 8.0* 7.3* 3.6* 3.6*    Cardiac Enzymes: No results for input(s): "CKTOTAL", "CKMB", "CKMBINDEX", "TROPONINI" in the last 168 hours.  BNP (last 3 results) No results for input(s): "PROBNP" in the last 8760 hours.  Lipid Profile: No results for input(s): "CHOL", "HDL", "LDLCALC", "TRIG", "CHOLHDL", "LDLDIRECT" in the last 72 hours.  Thyroid Function Tests: No results for input(s): "TSH", "T4TOTAL", "FREET4", "T3FREE", "THYROIDAB" in the last 72 hours.  Anemia Panel: No results for input(s): "VITAMINB12", "FOLATE", "FERRITIN", "TIBC", "IRON", "RETICCTPCT" in the last 72 hours.  Urine analysis:    Component Value Date/Time   COLORURINE STRAW (A) 10/04/2022 0603   APPEARANCEUR CLEAR 10/04/2022 0603   LABSPEC 1.006 10/04/2022 0603   PHURINE 5.0 10/04/2022 0603   GLUCOSEU NEGATIVE 10/04/2022 0603   HGBUR MODERATE (A) 10/04/2022 0603   BILIRUBINUR NEGATIVE 10/04/2022 0603   KETONESUR NEGATIVE 10/04/2022 0603   PROTEINUR 100 (A) 10/04/2022 0603   NITRITE NEGATIVE 10/04/2022 0603   LEUKOCYTESUR NEGATIVE 10/04/2022  0603    Sepsis Labs: Lactic Acid, Venous    Component Value Date/Time   LATICACIDVEN 1.4 06/20/2022 1837    MICROBIOLOGY: No results found for this or any previous visit (from the past 240 hour(s)).  RADIOLOGY STUDIES/RESULTS: DG Abd Portable 2V  Result Date: 10/03/2022 CLINICAL DATA:  Abdominal pain EXAM: PORTABLE ABDOMEN - 2 VIEW COMPARISON:  CT abdomen and pelvis 03/01/2021 FINDINGS: There is gaseous distention of small bowel without dilatation. Air seen throughout the colon. There are no suspicious calcifications. There surgical clips in the right abdomen. The heart is enlarged. There is likely a small right pleural effusion. There are postsurgical changes in the chest. IMPRESSION: 1. Gaseous distention of small bowel without dilatation. No bowel obstruction. 2. Cardiomegaly with small right pleural effusion. Electronically Signed   By: Darliss Cheney M.D.   On: 10/03/2022 22:11   DG Chest 1 View  Result Date: 10/03/2022 CLINICAL DATA:  Shortness of breath, abdominal pain EXAM: CHEST  1 VIEW COMPARISON:  06/20/2022 FINDINGS: Left AICD remains in place, unchanged. Prior median sternotomy and valve replacement. Cardiomegaly, vascular congestion. Bilateral airspace disease, favor edema. No effusions or acute bony abnormality. IMPRESSION: Cardiomegaly with bilateral airspace disease, likely edema. Electronically Signed   By: Charlett Nose M.D.   On: 10/03/2022 22:09   US Abdomen Limited RUQ (LIVER/GB)  Result Date: 10/03/2022 CLINICAL DATA:  Right upper quadrant abdominal pain for 2 weeks EXAM: ULTRASOUND ABDOMEN LIMITED RIGHT UPPER QUADRANT COMPARISON:  CT abdomen pelvis 03/01/2021 FINDINGS: Gallbladder: Status post cholecystectomy Common bile duct: Diameter: 2 mm Liver: Parenchymal echogenicity: Within normal limits Contours: Mildly nodular Lesions: None Portal vein: Patent.  Hepatopetal flow Other: 1.1 and 0.9 cm simple cyst in the upper pole the right kidney do not require dedicated imaging follow-up. Right pleural effusion is partially visualized. IMPRESSION: Nodular hepatic contour, suspicious for cirrhosis. Electronically Signed   By: Acquanetta Belling M.D.   On: 10/03/2022 16:49     LOS: 0 days   Jeoffrey Massed, MD  [facility]    To contact the attending provider between 7A-7P or the covering provider during after hours 7P-7A, please log into the web site [email or web address] and access using universal [place] password for that web site. If you do not have the password, please call the hospital operator.  10/04/2022, 9:28 AM

## 2022-10-04 NOTE — Assessment & Plan Note (Signed)
-  likely due to hx of IBS with alternating diarrhea and constipation. Pt is to lower quadrant/suprapubic region-likely unrelated to her abnormal LFTs and possible cirrhosis seen on RUQ ultrasound.  -Abd X-ray obtained without significant finding or stool burden -awaiting UA results but asymptomatic from urinary stand-point -continue to monitor

## 2022-10-04 NOTE — Progress Notes (Signed)
ANTICOAGULATION CONSULT NOTE - Initial Consult  Pharmacy Consult for Warfarin Indication:  mechanical valve  Allergies  Allergen Reactions   Ace Inhibitors Cough   Lipitor [Atorvastatin Calcium] Other (See Comments)    Knee pain    Megace [Megestrol] Other (See Comments)    High blood pressure and blurred vision   Aspirin Other (See Comments)    Nephrologist said to not take this   Clorazepate Dipotassium Other (See Comments)    Interacts with a drug being taken   Simvastatin Other (See Comments)    Muscle pain   Tiotropium Bromide Monohydrate Other (See Comments)    Dry mouth   Codeine Nausea And Vomiting   Tape Rash   Patient Measurements: Height: 5\' 2"  (157.5 cm) Weight: 41.7 kg (92 lb) IBW/kg (Calculated) : 50.1  Vital Signs: Temp: 97.9 F (36.6 C) (05/03 2329) Temp Source: Oral (05/03 2329) BP: 122/79 (05/04 0753) Pulse Rate: 79 (05/04 0753)  Labs: Recent Labs    10/01/22 1200 10/03/22 1332 10/04/22 0259 10/04/22 0740  HGB  --  9.9*  --   --   HCT  --  30.8*  --   --   PLT  --  271  --   --   LABPROT 66.9* 35.8*  --  35.9*  INR 7.3* 3.6*  --  3.6*  CREATININE  --  1.41* 1.31*  --     Estimated Creatinine Clearance: 22.9 mL/min (A) (by C-G formula based on SCr of 1.31 mg/dL (H)).  Medical History: Past Medical History:  Diagnosis Date   Allergy    seasonal   Anemia    Anxiety    Arthritis    Asthma    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 Protecta   Biventricular ICD (implantable cardioverter-defibrillator) in place    with CRT   Blood transfusion without reported diagnosis    Bursitis    Cataract    RIGHT EYE   CHF (congestive heart failure) (HCC)    Chronic kidney disease    Colon polyp    adenomatous   Complication of anesthesia    patient stated that had difficulty getting the breathing tube removed, patient said that she stopped breathing and HR dropped to 10  patient then woke up and started breathing  pateint stated no longer than one minute; re-intubated in PACU following cholecystectomy 10/28/13   COPD (chronic obstructive pulmonary disease) (HCC)    Depression    Diverticulosis    Dysrhythmia    Fibromyalgia    GERD (gastroesophageal reflux disease)    Gout    H/O mitral valve replacement 2002, 2007   Heart murmur    Hemorrhoids    Hyperlipidemia    Hypertension    Hypothyroidism    IBS (irritable bowel syndrome)    MVP (mitral valve prolapse)    NAUSEA AND VOMITING 08/01/2009   Neuromuscular disorder (HCC)    fibromyalgia   Pacemaker    Peptic ulcer disease    Medications:  Facility-Administered Medications Prior to Admission  Medication Dose Route Frequency Provider Last Rate Last Admin   0.9 %  sodium chloride infusion  500 mL Intravenous Continuous Hilarie Fredrickson, MD       Medications Prior to Admission  Medication Sig Dispense Refill Last Dose   acetaminophen (TYLENOL) 650 MG CR tablet Take 650 mg by mouth every 8 (eight) hours as needed for pain.   10/02/2022   allopurinol (ZYLOPRIM) 300 MG tablet Take 300 mg by  mouth every evening.    10/02/2022   ALPRAZolam (XANAX) 0.25 MG tablet Take 0.25 mg by mouth at bedtime as needed for anxiety or sleep.    10/02/2022   antiseptic oral rinse (BIOTENE) LIQD 15 mLs by Mouth Rinse route as needed for dry mouth.   10/02/2022   buPROPion (WELLBUTRIN SR) 150 MG 12 hr tablet Take 150 mg by mouth in the morning.   10/02/2022   esomeprazole (NEXIUM) 40 MG capsule TAKE 1 CAPSULE BY MOUTH EVERY DAY BEFORE BREAKFAST (Patient taking differently: Take 40 mg by mouth daily before breakfast.) 90 capsule 0 10/02/2022   fluticasone (FLONASE) 50 MCG/ACT nasal spray Place 1 spray into both nostrils as needed for allergies.   10/02/2022   furosemide (LASIX) 20 MG tablet Take 1 tablet (20 mg total) by mouth daily. 30 tablet 0 10/02/2022   furosemide (LASIX) 40 MG tablet TAKE 1 TABLET BY MOUTH DAILY. **NEEDS OV FOR FUTURE REFILLS** (Patient taking differently: Take  20 mg by mouth daily.) 90 tablet 2 10/02/2022   icosapent Ethyl (VASCEPA) 1 g capsule Take 1 capsule (1 g total) by mouth 2 (two) times daily. 60 capsule 11 10/02/2022   ipratropium (ATROVENT) 0.06 % nasal spray Place 2 sprays into both nostrils 2 (two) times daily.   10/02/2022   levothyroxine (SYNTHROID) 75 MCG tablet Take 75 mcg by mouth daily before breakfast.   10/02/2022   memantine (NAMENDA) 5 MG tablet Take 5 mg by mouth every morning.   10/02/2022   metoCLOPramide (REGLAN) 5 MG tablet Take 5 mg by mouth 2 (two) times daily as needed for nausea or vomiting.   10/02/2022   metoprolol succinate (TOPROL-XL) 50 MG 24 hr tablet Take 1 tablet (50 mg total) by mouth 2 (two) times daily. Take with or immediately following a meal. 180 tablet 3 10/02/2022 at 17:00   Multiple Vitamin (MULTIVITAMIN WITH MINERALS) TABS tablet Take 1 tablet by mouth daily.   10/02/2022   phenylephrine (SUDAFED PE) 10 MG TABS tablet Take 10 mg by mouth at bedtime as needed (Congestion).   10/02/2022   pravastatin (PRAVACHOL) 40 MG tablet Take 40 mg at bedtime by mouth.    10/02/2022   Sennosides (SENNA) 15 MG TABS Take 30 mg by mouth at bedtime.   10/02/2022   spironolactone (ALDACTONE) 25 MG tablet Take 1 tablet (25 mg total) by mouth daily. 90 tablet 3 10/02/2022   VOLTAREN 1 % GEL Apply 2-4 g topically in the morning and at bedtime.   10/02/2022   warfarin (COUMADIN) 2.5 MG tablet TAKE 1 TO 2 TABLETS BY MOUTH DAILY AS DIRECTED BY THE COUMADIN CLINIC (Patient taking differently: Take 2.5 mg by mouth daily. Take 1 to 2 tablets by mouth daily as directed by the coumadin clinic) 135 tablet 1 Past Week at 18:00   PROAIR HFA 108 (90 BASE) MCG/ACT inhaler Inhale 1-2 puffs into the lungs every 6 (six) hours as needed for wheezing.      Scheduled:   allopurinol  300 mg Oral QPM   buPROPion  150 mg Oral q AM   feeding supplement  237 mL Oral BID BM   furosemide  40 mg Intravenous BID   icosapent Ethyl  1 g Oral BID   levothyroxine  75 mcg Oral Q0600    memantine  5 mg Oral q morning   metoprolol succinate  50 mg Oral BID   ondansetron  4 mg Oral Once   pantoprazole  80 mg Oral Q1200  spironolactone  25 mg Oral Daily   Warfarin - Pharmacist Dosing Inpatient   Does not apply q1600   Infusions:   acetylcysteine 15 mg/kg/hr (10/03/22 1958)   PRN: ALPRAZolam  Assessment: 11 yof with a history of  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 Patient is presenting with abdominal pain. Warfarin per pharmacy consult placed. Notably, IV NAC has been started for questionable sub-acute/chronic acetaminophen toxicity concern. Ingestion amount reported has varied throughout hospital course and notable LFT increase. RUQ ultrasound was performed with findings of nodular hepatic contour suspicious for cirrhosis   Patient taking warfarin prior to arrival. Home dose is 5 mg (2.5 mg x 2) every Sun; 2.5 mg (2.5 mg x 1) all other days. INR was 8 and 7.3 on 5/1. Was instructed to hold 5/1-5/3 then resume usual dose until re-check. INR 3.6 on 5/3. PT / INR today is 35.9 / 3.6, which is borderline supra-therapeutic. Will give small warfarin dose today.   Goal of Therapy:  INR Goal 2.5-3.5 Monitor platelets by anticoagulation protocol: Yes   Plan:  Warfarin 1 mg PO x1 Monitor for s/s of hemorrhage, daily INR, CBC Watch for new DDIs  Georga Hacking, Pharm.D PGY1 Pharmacy Resident 10/04/2022 9:13 AM

## 2022-10-05 DIAGNOSIS — I5043 Acute on chronic combined systolic (congestive) and diastolic (congestive) heart failure: Secondary | ICD-10-CM | POA: Diagnosis not present

## 2022-10-05 DIAGNOSIS — R7989 Other specified abnormal findings of blood chemistry: Secondary | ICD-10-CM | POA: Diagnosis not present

## 2022-10-05 LAB — C-REACTIVE PROTEIN: CRP: 2.7 mg/dL — ABNORMAL HIGH (ref <1.0)

## 2022-10-05 LAB — PROTIME-INR
INR: 2.6 — ABNORMAL HIGH (ref 0.8–1.2)
Prothrombin Time: 27.2 seconds — ABNORMAL HIGH (ref 11.4–15.2)

## 2022-10-05 LAB — COMPREHENSIVE METABOLIC PANEL
ALT: 283 U/L — ABNORMAL HIGH (ref 0–44)
AST: 167 U/L — ABNORMAL HIGH (ref 15–41)
Albumin: 2.7 g/dL — ABNORMAL LOW (ref 3.5–5.0)
Alkaline Phosphatase: 135 U/L — ABNORMAL HIGH (ref 38–126)
Anion gap: 10 (ref 5–15)
BUN: 27 mg/dL — ABNORMAL HIGH (ref 8–23)
CO2: 25 mmol/L (ref 22–32)
Calcium: 8.6 mg/dL — ABNORMAL LOW (ref 8.9–10.3)
Chloride: 98 mmol/L (ref 98–111)
Creatinine, Ser: 1.26 mg/dL — ABNORMAL HIGH (ref 0.44–1.00)
GFR, Estimated: 43 mL/min — ABNORMAL LOW (ref >60)
Glucose, Bld: 83 mg/dL (ref 70–99)
Potassium: 3.1 mmol/L — ABNORMAL LOW (ref 3.5–5.1)
Sodium: 133 mmol/L — ABNORMAL LOW (ref 135–145)
Total Bilirubin: 0.8 mg/dL (ref 0.3–1.2)
Total Protein: 5.9 g/dL — ABNORMAL LOW (ref 6.5–8.1)

## 2022-10-05 LAB — CBC WITH DIFFERENTIAL/PLATELET
Abs Immature Granulocytes: 0.06 10*3/uL (ref 0.00–0.07)
Basophils Absolute: 0 10*3/uL (ref 0.0–0.1)
Basophils Relative: 1 %
Eosinophils Absolute: 0.4 10*3/uL (ref 0.0–0.5)
Eosinophils Relative: 4 %
HCT: 32.3 % — ABNORMAL LOW (ref 36.0–46.0)
Hemoglobin: 10.6 g/dL — ABNORMAL LOW (ref 12.0–15.0)
Immature Granulocytes: 1 %
Lymphocytes Relative: 11 %
Lymphs Abs: 0.9 10*3/uL (ref 0.7–4.0)
MCH: 30.3 pg (ref 26.0–34.0)
MCHC: 32.8 g/dL (ref 30.0–36.0)
MCV: 92.3 fL (ref 80.0–100.0)
Monocytes Absolute: 0.7 10*3/uL (ref 0.1–1.0)
Monocytes Relative: 9 %
Neutro Abs: 6.2 10*3/uL (ref 1.7–7.7)
Neutrophils Relative %: 74 %
Platelets: 276 10*3/uL (ref 150–400)
RBC: 3.5 MIL/uL — ABNORMAL LOW (ref 3.87–5.11)
RDW: 15.1 % (ref 11.5–15.5)
WBC: 8.2 10*3/uL (ref 4.0–10.5)
nRBC: 0.5 % — ABNORMAL HIGH (ref 0.0–0.2)

## 2022-10-05 LAB — BRAIN NATRIURETIC PEPTIDE: B Natriuretic Peptide: 3750.4 pg/mL — ABNORMAL HIGH (ref 0.0–100.0)

## 2022-10-05 LAB — MAGNESIUM: Magnesium: 1.4 mg/dL — ABNORMAL LOW (ref 1.7–2.4)

## 2022-10-05 MED ORDER — PNEUMOCOCCAL 20-VAL CONJ VACC 0.5 ML IM SUSY
0.5000 mL | PREFILLED_SYRINGE | INTRAMUSCULAR | Status: AC
  Administered 2022-10-06: 0.5 mL via INTRAMUSCULAR
  Filled 2022-10-05: qty 0.5

## 2022-10-05 MED ORDER — WARFARIN SODIUM 5 MG PO TABS: 5.0000 mg | ORAL_TABLET | Freq: Once | ORAL | Status: DC

## 2022-10-05 MED ORDER — POTASSIUM CHLORIDE CRYS ER 20 MEQ PO TBCR
40.0000 meq | EXTENDED_RELEASE_TABLET | Freq: Two times a day (BID) | ORAL | Status: AC
  Administered 2022-10-05 (×2): 40 meq via ORAL
  Filled 2022-10-05 (×2): qty 2

## 2022-10-05 MED ORDER — FUROSEMIDE 10 MG/ML IJ SOLN
80.0000 mg | Freq: Two times a day (BID) | INTRAMUSCULAR | Status: DC
  Administered 2022-10-05 – 2022-10-06 (×3): 80 mg via INTRAVENOUS
  Filled 2022-10-05 (×3): qty 8

## 2022-10-05 MED ORDER — MAGNESIUM SULFATE 4 GM/100ML IV SOLN
4.0000 g | Freq: Once | INTRAVENOUS | Status: AC
  Administered 2022-10-05: 4 g via INTRAVENOUS
  Filled 2022-10-05: qty 100

## 2022-10-05 NOTE — Progress Notes (Addendum)
PROGRESS NOTE        PATIENT DETAILS Name: [name] Age: 82 y.o. Sex: female Date of Birth: 02-Oct-1940 Admit Date: 10/03/2022 Admitting Physician Anselm Jungling, DO ZOX:[id number], Barry Dienes, MD  Brief Summary: Patient is a 81 y.o.  female with history of HFrEF, mitral valve replacement-mechanical valve on Coumadin, COPD, HTN, PAF-who presented with abdominal pain with worsening orthopnea and exertional dyspnea x 7 days--found to have elevated transaminases-with concern for either congestive hepatitis or Tylenol toxicity-started on Mucomyst infusion after discussion with poison control and admitted to the hospitalist service.   Significant events: 5/3>> admit to [facility]  Significant studies: 5/3>> RUQ ultrasound: S/p cholecystectomy-nodular hepatic contour suspicious for cirrhosis 5/3>> CXR: Bilateral airspace disease-likely pulm edema 5/3>> x-ray abdomen: No bowel obstruction 5/3>> acute hepatitis serology: Negative 5/3>> lipase: Negative 5/3>> Tylenol level:<10  Significant microbiology data: None  Procedures: None  Consults: Cardiology  Subjective:  Patient in bed, appears comfortable, denies any headache, no fever, no chest pain or pressure, no shortness of breath , no abdominal pain. No new focal weakness.   Objective: Vitals: Blood pressure 137/61, pulse 74, temperature 97.9 F (36.6 C), temperature source Oral, resp. rate 17, height 5\' 2"  (1.575 m), weight 45.3 kg, SpO2 96 %.   Exam:  Awake Alert, No new F.N deficits, Normal affect [place].AT,PERRAL Supple Neck, No JVD,   Symmetrical Chest wall movement, Good air movement bilaterally, CTAB RRR,No Gallops, Rubs or new Murmurs,  +ve B.Sounds, Abd Soft, No tenderness,   No Cyanosis, Clubbing or edema    Assessment/Plan:  Elevated transaminases  -  etiology-likely from CHF and congestive otitis, acute hepatitis serology negative, she has had cholecystectomy in the past, seen by GI remotely,  per [facility] nothing else to offer except diuresis as being done by cardiology.  LFTs are downtrending.  There was suspicion for possible mild Tylenol toxicity for which she has finished Mucomyst infusion.  Continue trending LFTs and INR.   Acute on chronic combined systolic and diastolic heart failure EF around 20 to 25%, has AICD.  No peripheral signs of volume overload-but CXR with pulm edema-significantly elevated BNP levels.  Cardiology on board and directing diuretic dose.  Much improved clinically.  There is also question of possible heart cath this admission.  PAF Beta-blocker Coumadin for pharmacy-INR therapeutic, will be transition to heparin once INR reaches below 2.5 as instructed by cardiology as they are contemplating cardiac catheterization this admission.  Mechanical mitral valve replacement 2002 Goal INR 2.5-3.5 Coumadin per pharmacy  Hypokalemia and hypomagnesemia.  Replaced.    CKD stage IIIb At baseline  COPD Not in exacerbation Bronchodilators  HTN BP stable Beta-blocker/Aldactone/Lasix  Hypothyroidism Levothyroxine  Mood disorder Appears stable Wellbutrin/Xanax  Underweight: Estimated body mass index is 18.27 kg/m as calculated from the following:   Height as of this encounter: 5\' 2"  (1.575 m).   Weight as of this encounter: 45.3 kg.   Code status:   Code Status: Prior   DVT Prophylaxis:    Family Communication: None at bedside   Disposition Plan: Status is: Observation The patient will require care spanning > 2 midnights and should be moved to inpatient because: Severity of illness   Planned Discharge Destination:Home   Diet: Diet Order             Diet Heart Room service appropriate? Yes; Fluid consistency:  Thin  Diet effective now                     Antimicrobial agents: Anti-infectives (From admission, onward)    None        MEDICATIONS: Scheduled Meds:  allopurinol  300 mg Oral QPM   buPROPion  150 mg Oral  q AM   feeding supplement  237 mL Oral BID BM   furosemide  80 mg Intravenous BID   icosapent Ethyl  1 g Oral BID   levothyroxine  75 mcg Oral Q0600   memantine  5 mg Oral q morning   metoprolol succinate  50 mg Oral BID   ondansetron  4 mg Oral Once   pantoprazole  80 mg Oral Q1200   [START ON 10/06/2022] pneumococcal 20-valent conjugate vaccine  0.5 mL Intramuscular Tomorrow-1000   potassium chloride  40 mEq Oral BID   spironolactone  25 mg Oral Daily   Warfarin - Pharmacist Dosing Inpatient   Does not apply q1600   Continuous Infusions:  magnesium sulfate bolus IVPB 4 g (10/05/22 1044)   PRN Meds:.ALPRAZolam   I have personally reviewed following labs and imaging studies  LABORATORY DATA:  Recent Labs  Lab 10/03/22 1332 10/05/22 0318  WBC 6.5 8.2  HGB 9.9* 10.6*  HCT 30.8* 32.3*  PLT 271 276  MCV 95.1 92.3  MCH 30.6 30.3  MCHC 32.1 32.8  RDW 14.9 15.1  LYMPHSABS  --  0.9  MONOABS  --  0.7  EOSABS  --  0.4  BASOSABS  --  0.0    Recent Labs  Lab 10/01/22 1143 10/01/22 1200 10/03/22 1332 10/04/22 0259 10/04/22 0740 10/05/22 0254 10/05/22 0318  NA  --   --  132* 131*  --  133*  --   K  --   --  3.9 4.1  --  3.1*  --   CL  --   --  95* 96*  --  98  --   CO2  --   --  24 22  --  25  --   ANIONGAP  --   --  13 13  --  10  --   GLUCOSE  --   --  91 148*  --  83  --   BUN  --   --  24* 25*  --  27*  --   CREATININE  --   --  1.41* 1.31*  --  1.26*  --   AST  --   --  356* 262*  --  167*  --   ALT  --   --  343* 333*  --  283*  --   ALKPHOS  --   --  142* 136*  --  135*  --   BILITOT  --   --  0.9 1.6*  --  0.8  --   ALBUMIN  --   --  3.1* 2.6*  --  2.7*  --   CRP  --   --   --   --   --  2.7*  --   INR 8.0* 7.3* 3.6*  --  3.6* 2.6*  --   BNP  --   --   --  3,914.7*  --   --  3,750.4*  MG  --   --   --   --   --  1.4*  --   CALCIUM  --   --  8.7* 8.6*  --  8.6*  --      Recent Labs  Lab 10/01/22 1143 10/01/22 1200 10/03/22 1332 10/04/22 0259  10/04/22 0740 10/05/22 0254 10/05/22 0318  CRP  --   --   --   --   --  2.7*  --   INR 8.0* 7.3* 3.6*  --  3.6* 2.6*  --   BNP  --   --   --  3,914.7*  --   --  3,750.4*  MG  --   --   --   --   --  1.4*  --   CALCIUM  --   --  8.7* 8.6*  --  8.6*  --      RADIOLOGY STUDIES/RESULTS: DG Abd Portable 2V  Result Date: 10/03/2022 CLINICAL DATA:  Abdominal pain EXAM: PORTABLE ABDOMEN - 2 VIEW COMPARISON:  CT abdomen and pelvis 03/01/2021 FINDINGS: There is gaseous distention of small bowel without dilatation. Air seen throughout the colon. There are no suspicious calcifications. There surgical clips in the right abdomen. The heart is enlarged. There is likely a small right pleural effusion. There are postsurgical changes in the chest. IMPRESSION: 1. Gaseous distention of small bowel without dilatation. No bowel obstruction. 2. Cardiomegaly with small right pleural effusion. Electronically Signed   By: Darliss Cheney M.D.   On: 10/03/2022 22:11   DG Chest 1 View  Result Date: 10/03/2022 CLINICAL DATA:  Shortness of breath, abdominal pain EXAM: CHEST  1 VIEW COMPARISON:  06/20/2022 FINDINGS: Left AICD remains in place, unchanged. Prior median sternotomy and valve replacement. Cardiomegaly, vascular congestion. Bilateral airspace disease, favor edema. No effusions or acute bony abnormality. IMPRESSION: Cardiomegaly with bilateral airspace disease, likely edema. Electronically Signed   By: Charlett Nose M.D.   On: 10/03/2022 22:09   US Abdomen Limited RUQ (LIVER/GB)  Result Date: 10/03/2022 CLINICAL DATA:  Right upper quadrant abdominal pain for 2 weeks EXAM: ULTRASOUND ABDOMEN LIMITED RIGHT UPPER QUADRANT COMPARISON:  CT abdomen pelvis 03/01/2021 FINDINGS: Gallbladder: Status post cholecystectomy Common bile duct: Diameter: 2 mm Liver: Parenchymal echogenicity: Within normal limits Contours: Mildly nodular Lesions: None Portal vein: Patent.  Hepatopetal flow Other: 1.1 and 0.9 cm simple cyst in the upper  pole the right kidney do not require dedicated imaging follow-up. Right pleural effusion is partially visualized. IMPRESSION: Nodular hepatic contour, suspicious for cirrhosis. Electronically Signed   By: Acquanetta Belling M.D.   On: 10/03/2022 16:49     LOS: 1 day   Signature  -    Susa Raring M.D on 10/05/2022 at 11:02 AM   -  To page go to [email or web address]

## 2022-10-05 NOTE — Progress Notes (Addendum)
ANTICOAGULATION CONSULT NOTE  Pharmacy Consult for Warfarin Indication:  mechanical valve  Allergies  Allergen Reactions   Ace Inhibitors Cough   Lipitor [Atorvastatin Calcium] Other (See Comments)    Knee pain    Megace [Megestrol] Other (See Comments)    High blood pressure and blurred vision   Aspirin Other (See Comments)    Nephrologist said to not take this   Clorazepate Dipotassium Other (See Comments)    Interacts with a drug being taken   Simvastatin Other (See Comments)    Muscle pain   Tiotropium Bromide Monohydrate Other (See Comments)    Dry mouth   Codeine Nausea And Vomiting   Tape Rash   Patient Measurements: Height: 5\' 2"  (157.5 cm) Weight: 45.3 kg (99 lb 13.9 oz) IBW/kg (Calculated) : 50.1  Vital Signs: Temp: 97.9 F (36.6 C) (05/05 0420) Temp Source: Oral (05/05 0420) BP: 137/61 (05/05 0420) Pulse Rate: 74 (05/04 2336)  Labs: Recent Labs    10/03/22 1332 10/04/22 0259 10/04/22 0740 10/05/22 0254 10/05/22 0318  HGB 9.9*  --   --   --  10.6*  HCT 30.8*  --   --   --  32.3*  PLT 271  --   --   --  276  LABPROT 35.8*  --  35.9* 27.2*  --   INR 3.6*  --  3.6* 2.6*  --   CREATININE 1.41* 1.31*  --  1.26*  --     Estimated Creatinine Clearance: 25.9 mL/min (A) (by C-G formula based on SCr of 1.26 mg/dL (H)).  Medical History: Past Medical History:  Diagnosis Date   Allergy    seasonal   Anemia    Anxiety    Arthritis    Asthma    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 Protecta   Biventricular ICD (implantable cardioverter-defibrillator) in place    with CRT   Blood transfusion without reported diagnosis    Bursitis    Cataract    RIGHT EYE   CHF (congestive heart failure) (HCC)    Chronic kidney disease    Colon polyp    adenomatous   Complication of anesthesia    patient stated that had difficulty getting the breathing tube removed, patient said that she stopped breathing and HR dropped to 10   patient then woke up and started breathing pateint stated no longer than one minute; re-intubated in PACU following cholecystectomy 10/28/13   COPD (chronic obstructive pulmonary disease) (HCC)    Depression    Diverticulosis    Dysrhythmia    Fibromyalgia    GERD (gastroesophageal reflux disease)    Gout    H/O mitral valve replacement 2002, 2007   Heart murmur    Hemorrhoids    Hyperlipidemia    Hypertension    Hypothyroidism    IBS (irritable bowel syndrome)    MVP (mitral valve prolapse)    NAUSEA AND VOMITING 08/01/2009   Neuromuscular disorder (HCC)    fibromyalgia   Pacemaker    Peptic ulcer disease    Medications:  Facility-Administered Medications Prior to Admission  Medication Dose Route Frequency Provider Last Rate Last Admin   0.9 %  sodium chloride infusion  500 mL Intravenous Continuous Hilarie Fredrickson, MD       Medications Prior to Admission  Medication Sig Dispense Refill Last Dose   acetaminophen (TYLENOL) 650 MG CR tablet Take 650 mg by mouth every 8 (eight) hours as needed for pain.   10/02/2022  allopurinol (ZYLOPRIM) 300 MG tablet Take 300 mg by mouth every evening.    10/02/2022   ALPRAZolam (XANAX) 0.25 MG tablet Take 0.25 mg by mouth at bedtime as needed for anxiety or sleep.    10/02/2022   antiseptic oral rinse (BIOTENE) LIQD 15 mLs by Mouth Rinse route as needed for dry mouth.   10/02/2022   buPROPion (WELLBUTRIN SR) 150 MG 12 hr tablet Take 150 mg by mouth in the morning.   10/02/2022   esomeprazole (NEXIUM) 40 MG capsule TAKE 1 CAPSULE BY MOUTH EVERY DAY BEFORE BREAKFAST (Patient taking differently: Take 40 mg by mouth daily before breakfast.) 90 capsule 0 10/02/2022   fluticasone (FLONASE) 50 MCG/ACT nasal spray Place 1 spray into both nostrils as needed for allergies.   10/02/2022   furosemide (LASIX) 20 MG tablet Take 1 tablet (20 mg total) by mouth daily. 30 tablet 0 10/02/2022   furosemide (LASIX) 40 MG tablet TAKE 1 TABLET BY MOUTH DAILY. **NEEDS OV FOR FUTURE  REFILLS** (Patient taking differently: Take 20 mg by mouth daily.) 90 tablet 2 10/02/2022   icosapent Ethyl (VASCEPA) 1 g capsule Take 1 capsule (1 g total) by mouth 2 (two) times daily. 60 capsule 11 10/02/2022   ipratropium (ATROVENT) 0.06 % nasal spray Place 2 sprays into both nostrils 2 (two) times daily.   10/02/2022   levothyroxine (SYNTHROID) 75 MCG tablet Take 75 mcg by mouth daily before breakfast.   10/02/2022   memantine (NAMENDA) 5 MG tablet Take 5 mg by mouth every morning.   10/02/2022   metoCLOPramide (REGLAN) 5 MG tablet Take 5 mg by mouth 2 (two) times daily as needed for nausea or vomiting.   10/02/2022   metoprolol succinate (TOPROL-XL) 50 MG 24 hr tablet Take 1 tablet (50 mg total) by mouth 2 (two) times daily. Take with or immediately following a meal. 180 tablet 3 10/02/2022 at 17:00   Multiple Vitamin (MULTIVITAMIN WITH MINERALS) TABS tablet Take 1 tablet by mouth daily.   10/02/2022   phenylephrine (SUDAFED PE) 10 MG TABS tablet Take 10 mg by mouth at bedtime as needed (Congestion).   10/02/2022   pravastatin (PRAVACHOL) 40 MG tablet Take 40 mg at bedtime by mouth.    10/02/2022   Sennosides (SENNA) 15 MG TABS Take 30 mg by mouth at bedtime.   10/02/2022   spironolactone (ALDACTONE) 25 MG tablet Take 1 tablet (25 mg total) by mouth daily. 90 tablet 3 10/02/2022   VOLTAREN 1 % GEL Apply 2-4 g topically in the morning and at bedtime.   10/02/2022   warfarin (COUMADIN) 2.5 MG tablet TAKE 1 TO 2 TABLETS BY MOUTH DAILY AS DIRECTED BY THE COUMADIN CLINIC (Patient taking differently: Take 2.5 mg by mouth daily. Take 1 to 2 tablets by mouth daily as directed by the coumadin clinic) 135 tablet 1 Past Week at 18:00   PROAIR HFA 108 (90 BASE) MCG/ACT inhaler Inhale 1-2 puffs into the lungs every 6 (six) hours as needed for wheezing.      Scheduled:   allopurinol  300 mg Oral QPM   buPROPion  150 mg Oral q AM   feeding supplement  237 mL Oral BID BM   furosemide  40 mg Intravenous BID   icosapent Ethyl  1 g  Oral BID   levothyroxine  75 mcg Oral Q0600   memantine  5 mg Oral q morning   metoprolol succinate  50 mg Oral BID   ondansetron  4 mg Oral Once  pantoprazole  80 mg Oral Q1200   potassium chloride  40 mEq Oral BID   spironolactone  25 mg Oral Daily   Warfarin - Pharmacist Dosing Inpatient   Does not apply q1600   Infusions:   magnesium sulfate bolus IVPB     PRN: ALPRAZolam  Assessment: 78 yof with a history of  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 Patient is presenting with abdominal pain. Warfarin per pharmacy consult placed. Notably, IV NAC has been started for questionable sub-acute/chronic acetaminophen toxicity concern. Ingestion amount reported has varied throughout hospital course and notable LFT increase. RUQ ultrasound was performed with findings of nodular hepatic contour suspicious for cirrhosis   Patient taking warfarin prior to arrival. Home dose is 5 mg (2.5 mg x 2) every Sun; 2.5 mg (2.5 mg x 1) all other days. INR was 8 and 7.3 on 5/1. Was instructed to hold 5/1-5/3 then resume usual dose until re-check. INR therapeutic at 2.6 today.   Goal of Therapy:  INR Goal 2.5-3.5 Monitor platelets by anticoagulation protocol: Yes   Plan:  Warfarin 5 mg PO x1 Monitor for s/s of hemorrhage, daily INR, CBC Watch for new DDIs  Georga Hacking, Pharm.D PGY1 Pharmacy Resident 10/05/2022 7:48 AM  Addendum: Pharmacy consulted to transition to heparin when INR<2.5 to allow for cardiac cath next week.  Updated Plan: Hold warfarin tonight and plan to transition to heparin when INR<2.5 Daily INR and CBC  Georga Hacking, Pharm.D PGY1 Pharmacy Resident 10/05/2022 9:46 AM

## 2022-10-05 NOTE — Evaluation (Signed)
Physical Therapy Evaluation Patient Details Name: [name] MRN: [id number] DOB: 07-12-1940 Today's Date: 10/05/2022  History of Present Illness  Pt is a 82 y/o F presenting to ED on 5/3 from PCP with lower abdominal pain and low Hgb. Found to have elevated transaminases with concern for congestive hepatitis or tylenol toxicity. RUQ ultrasound noted nodular hepatic contour suspicious for cirrhosis. PMH includes COPD, fibromyalgia, GERD, HTN, HLD, IBS, CKD  Clinical Impression  Pt admitted with above diagnosis.  At home she has necessary DME and support if needed.  Pt is normally independent with use of cane or rollator and can ambulate in community.  Today, pt was supervision level for lines and able to transfer and ambulated 300'with rollator safely with VSS on RA.  Pt near baseline and does not required skilled PT services.        Recommendations for follow up therapy are one component of a multi-disciplinary discharge planning process, led by the attending physician.  Recommendations may be updated based on patient status, additional functional criteria and insurance authorization.  Follow Up Recommendations       Assistance Recommended at Discharge PRN  Patient can return home with the following  Assistance with cooking/housework    Equipment Recommendations None recommended by PT  Recommendations for Other Services       Functional Status Assessment Patient has not had a recent decline in their functional status     Precautions / Restrictions Precautions Precautions: None      Mobility  Bed Mobility Overal bed mobility: Needs Assistance Bed Mobility: Supine to Sit, Sit to Supine     Supine to sit: Supervision Sit to supine: Supervision   General bed mobility comments: supervision for lines    Transfers Overall transfer level: Needs assistance Equipment used: Rollator (4 wheels) Transfers: Sit to/from Stand Sit to Stand: Supervision                 Ambulation/Gait Ambulation/Gait assistance: Supervision Gait Distance (Feet): 300 Feet Assistive device: Rollator (4 wheels) Gait Pattern/deviations: Step-through pattern Gait velocity: normal     General Gait Details: steady gait with rollator  Stairs            Wheelchair Mobility    Modified Rankin (Stroke Patients Only)       Balance Overall balance assessment: Modified Independent Sitting-balance support: No upper extremity supported Sitting balance-Leahy Scale: Good     Standing balance support: No upper extremity supported, Bilateral upper extremity supported Standing balance-Leahy Scale: Good Standing balance comment: Rollator to ambulate for safety but could walk short distance without support                             Pertinent Vitals/Pain Pain Assessment Pain Assessment: No/denies pain    Home Living Family/patient expects to be discharged to:: Private residence Living Arrangements: Spouse/significant other Available Help at Discharge: Family;Available 24 hours/day Type of Home: Mobile home Home Access: Stairs to enter Entrance Stairs-Rails: Right;Left;Can reach both Entrance Stairs-Number of Steps: 3   Home Layout: One level Home Equipment: Agricultural consultant (2 wheels);Rollator (4 wheels);Cane - single point;Grab bars - tub/shower;Shower seat - built in      Prior Function Prior Level of Function : Independent/Modified Independent;Driving             Mobility Comments: Typically uses cane most of the time, but recently using rollator due to some wrist pain.  Could ambulate in community ADLs Comments:  stands for showers with supervision from spouse, limited driving     Hand Dominance        Extremity/Trunk Assessment   Upper Extremity Assessment Upper Extremity Assessment: Overall WFL for tasks assessed    Lower Extremity Assessment Lower Extremity Assessment: Overall WFL for tasks assessed (ROM WFL; MMT 5/5)     Cervical / Trunk Assessment Cervical / Trunk Assessment: Normal  Communication   Communication: No difficulties  Cognition Arousal/Alertness: Awake/alert Behavior During Therapy: WFL for tasks assessed/performed Overall Cognitive Status: Within Functional Limits for tasks assessed                                          General Comments General comments (skin integrity, edema, etc.): VSS on RA    Exercises     Assessment/Plan    PT Assessment Patient does not need any further PT services  PT Problem List         PT Treatment Interventions      PT Goals (Current goals can be found in the Care Plan section)  Acute Rehab PT Goals Patient Stated Goal: return home PT Goal Formulation: All assessment and education complete, DC therapy    Frequency       Co-evaluation               AM-PAC PT "6 Clicks" Mobility  Outcome Measure Help needed turning from your back to your side while in a flat bed without using bedrails?: None Help needed moving from lying on your back to sitting on the side of a flat bed without using bedrails?: None Help needed moving to and from a bed to a chair (including a wheelchair)?: A Little Help needed standing up from a chair using your arms (e.g., wheelchair or bedside chair)?: A Little Help needed to walk in hospital room?: A Little Help needed climbing 3-5 steps with a railing? : A Little 6 Click Score: 20    End of Session Equipment Utilized During Treatment: Gait belt Activity Tolerance: Patient tolerated treatment well Patient left: with chair alarm set;in chair;with call bell/phone within reach Nurse Communication: Mobility status PT Visit Diagnosis: Other abnormalities of gait and mobility (R26.89)    Time: 1610-9604 PT Time Calculation (min) (ACUTE ONLY): 14 min   Charges:   PT Evaluation $PT Eval Low Complexity: 1 Low          Jeny Nield, PT Acute Rehab [facility]  [phone]   Rayetta Humphrey 10/05/2022, 4:29 PM

## 2022-10-05 NOTE — Evaluation (Signed)
Occupational Therapy Evaluation Patient Details Name: [name] MRN: [id number] DOB: 04/10/1941 Today's Date: 10/05/2022   History of Present Illness Pt is a 82 y/o F presenting to ED on 5/3 from PCP with lower abdominal pain and low Hgb. Found to have elevated transaminases with concern for congestive hepatitis or tylenol toxicity. RUQ ultrasound noted nodular hepatic contour suspicious for cirrhosis. PMH includes COPD, fibromyalgia, GERD, HTN, HLD, IBS, CKD   Clinical Impression   Pt reports independence at baseline with ADLs and uses rollator/cane for mobility, lives with spouse who can assist at d/c. Pt currently needing  mod I -supervision for ADLs, and supervision for transfers with use of rollator. Pt presenting with impairments listed below, pt reports/appears at baseline level of functioning. however has no acute OT needs at this time, will s/o. Please reconsult if there is a change in pt status. Anticipate no OT follow up needs at d/c.     Recommendations for follow up therapy are one component of a multi-disciplinary discharge planning process, led by the attending physician.  Recommendations may be updated based on patient status, additional functional criteria and insurance authorization.   Assistance Recommended at Discharge PRN  Patient can return home with the following Assistance with cooking/housework;Assist for transportation    Functional Status Assessment  Patient has had a recent decline in their functional status and demonstrates the ability to make significant improvements in function in a reasonable and predictable amount of time.  Equipment Recommendations  None recommended by OT (pt has all needed DME)    Recommendations for Other Services       Precautions / Restrictions Precautions Precautions: None Restrictions Weight Bearing Restrictions: No      Mobility Bed Mobility               General bed mobility comments: seated EOB upon arrival  and departure    Transfers Overall transfer level: Needs assistance Equipment used: Rollator (4 wheels) Transfers: Sit to/from Stand Sit to Stand: Supervision                  Balance Overall balance assessment: Mild deficits observed, not formally tested                                         ADL either performed or assessed with clinical judgement   ADL Overall ADL's : Needs assistance/impaired Eating/Feeding: Modified independent   Grooming: Wash/dry hands;Modified independent;Standing   Upper Body Bathing: Supervision/ safety   Lower Body Bathing: Supervison/ safety   Upper Body Dressing : Supervision/safety   Lower Body Dressing: Supervision/safety   Toilet Transfer: Supervision/safety;Ambulation;Rollator (4 wheels);Regular Toilet   Toileting- Clothing Manipulation and Hygiene: Supervision/safety       Functional mobility during ADLs: Supervision/safety       Vision Baseline Vision/History: 1 Wears glasses Vision Assessment?: No apparent visual deficits Additional Comments: wears glasses for reading only     Perception Perception Perception Tested?: No   Praxis Praxis Praxis tested?: Not tested    Pertinent Vitals/Pain Pain Assessment Pain Assessment: Faces Pain Score: 5  Faces Pain Scale: Hurts little more Pain Location: LUE IV site Pain Descriptors / Indicators: Discomfort Pain Intervention(s): Limited activity within patient's tolerance, Monitored during session, Repositioned     Hand Dominance Right   Extremity/Trunk Assessment Upper Extremity Assessment Upper Extremity Assessment: Overall WFL for tasks assessed   Lower Extremity Assessment Lower  Extremity Assessment: Overall WFL for tasks assessed   Cervical / Trunk Assessment Cervical / Trunk Assessment: Normal   Communication Communication Communication: No difficulties   Cognition Arousal/Alertness: Awake/alert Behavior During Therapy: WFL for tasks  assessed/performed Overall Cognitive Status: Within Functional Limits for tasks assessed                                       General Comments  VSS on RA, spouse present and supportive    Exercises     Shoulder Instructions      Home Living Family/patient expects to be discharged to:: Private residence Living Arrangements: Spouse/significant other Available Help at Discharge: Family;Available 24 hours/day Type of Home: Mobile home Home Access: Stairs to enter Entrance Stairs-Number of Steps: 3 Entrance Stairs-Rails: Right;Left;Can reach both Home Layout: One level     Bathroom Shower/Tub: Producer, television/film/video: Standard Bathroom Accessibility: Yes   Home Equipment: Agricultural consultant (2 wheels);Rollator (4 wheels);Cane - single point;Grab bars - tub/shower;Shower seat - built in          Prior Functioning/Environment Prior Level of Function : Independent/Modified Independent;Driving             Mobility Comments: uses cane  and rollator at baseline ADLs Comments: stands for showers with supervision from spouse, limited driving        OT Problem List:        OT Treatment/Interventions:      OT Goals(Current goals can be found in the care plan section) Acute Rehab OT Goals Patient Stated Goal: none stated OT Goal Formulation: With patient Time For Goal Achievement: 10/19/22 Potential to Achieve Goals: Good  OT Frequency:      Co-evaluation              AM-PAC OT "6 Clicks" Daily Activity     Outcome Measure Help from another person eating meals?: None Help from another person taking care of personal grooming?: None Help from another person toileting, which includes using toliet, bedpan, or urinal?: None Help from another person bathing (including washing, rinsing, drying)?: None Help from another person to put on and taking off regular upper body clothing?: None Help from another person to put on and taking off regular  lower body clothing?: None 6 Click Score: 24   End of Session Equipment Utilized During Treatment: Rollator (4 wheels) Nurse Communication: Mobility status  Activity Tolerance: Patient tolerated treatment well Patient left: in bed;with call bell/phone within reach;with family/visitor present (seated EOB)  OT Visit Diagnosis: Muscle weakness (generalized) (M62.81)                Time: 8295-6213 OT Time Calculation (min): 18 min Charges:  OT General Charges $OT Visit: 1 Visit OT Evaluation $OT Eval Low Complexity: 1 Low  [name], OTD, OTR/L SecureChat Preferred Acute Rehab [phone]   [name] 10/05/2022, 12:33 PM

## 2022-10-05 NOTE — Progress Notes (Signed)
Rounding Note    Patient Name: [name] Date of Encounter: 10/05/2022  [facility] Cardiologist: Thurmon Fair, MD   Subjective   Breathing unchanged. No significant chest pan overnight.   Inpatient Medications    Scheduled Meds:  allopurinol  300 mg Oral QPM   buPROPion  150 mg Oral q AM   feeding supplement  237 mL Oral BID BM   furosemide  40 mg Intravenous BID   icosapent Ethyl  1 g Oral BID   levothyroxine  75 mcg Oral Q0600   memantine  5 mg Oral q morning   metoprolol succinate  50 mg Oral BID   ondansetron  4 mg Oral Once   pantoprazole  80 mg Oral Q1200   potassium chloride  40 mEq Oral BID   spironolactone  25 mg Oral Daily   warfarin  5 mg Oral ONCE-1600   Warfarin - Pharmacist Dosing Inpatient   Does not apply q1600   Continuous Infusions:  magnesium sulfate bolus IVPB     PRN Meds: ALPRAZolam   Vital Signs    Vitals:   10/04/22 2055 10/04/22 2336 10/05/22 0420 10/05/22 0449  BP: (!) 134/111 (!) 150/71 137/61   Pulse: 82 74    Resp:  20 17   Temp:  97.9 F (36.6 C) 97.9 F (36.6 C)   TempSrc:  Oral Oral   SpO2:   96%   Weight:    45.3 kg  Height:        Intake/Output Summary (Last 24 hours) at 10/05/2022 0835 Last data filed at 10/05/2022 0553 Gross per 24 hour  Intake 240 ml  Output 1100 ml  Net -860 ml      10/05/2022    4:49 AM 10/03/2022    1:11 PM 09/03/2022   10:07 AM  Last 3 Weights  Weight (lbs) 99 lb 13.9 oz 92 lb 93 lb  Weight (kg) 45.3 kg 41.731 kg 42.185 kg      Telemetry    Paced rhythm - Personally Reviewed  ECG    Paced rhythm - Personally Reviewed  Physical Exam   GEN: No acute distress.   Neck: No JVD Cardiac: RRR, no murmurs, rubs, or gallops.  Respiratory: bibasilar crackles GI: Soft, nontender, non-distended  MS: No edema; No deformity. Neuro:  Nonfocal  Psych: Normal affect   Labs    High Sensitivity Troponin:  No results for input(s): "TROPONINIHS" in the last 720 hours.    Chemistry Recent Labs  Lab 10/03/22 1332 10/04/22 0259 10/05/22 0254  NA 132* 131* 133*  K 3.9 4.1 3.1*  CL 95* 96* 98  CO2 24 22 25   GLUCOSE 91 148* 83  BUN 24* 25* 27*  CREATININE 1.41* 1.31* 1.26*  CALCIUM 8.7* 8.6* 8.6*  MG  --   --  1.4*  PROT 6.5 6.2* 5.9*  ALBUMIN 3.1* 2.6* 2.7*  AST 356* 262* 167*  ALT 343* 333* 283*  ALKPHOS 142* 136* 135*  BILITOT 0.9 1.6* 0.8  GFRNONAA 38* 41* 43*  ANIONGAP 13 13 10     Lipids No results for input(s): "CHOL", "TRIG", "HDL", "LABVLDL", "LDLCALC", "CHOLHDL" in the last 168 hours.  Hematology Recent Labs  Lab 10/03/22 1332 10/05/22 0318  WBC 6.5 8.2  RBC 3.24* 3.50*  HGB 9.9* 10.6*  HCT 30.8* 32.3*  MCV 95.1 92.3  MCH 30.6 30.3  MCHC 32.1 32.8  RDW 14.9 15.1  PLT 271 276   Thyroid No results for input(s): "TSH", "FREET4"  in the last 168 hours.  BNP Recent Labs  Lab 10/04/22 0259 10/05/22 0318  BNP 3,914.7* 3,750.4*    DDimer No results for input(s): "DDIMER" in the last 168 hours.   Radiology    DG Abd Portable 2V  Result Date: 10/03/2022 CLINICAL DATA:  Abdominal pain EXAM: PORTABLE ABDOMEN - 2 VIEW COMPARISON:  CT abdomen and pelvis 03/01/2021 FINDINGS: There is gaseous distention of small bowel without dilatation. Air seen throughout the colon. There are no suspicious calcifications. There surgical clips in the right abdomen. The heart is enlarged. There is likely a small right pleural effusion. There are postsurgical changes in the chest. IMPRESSION: 1. Gaseous distention of small bowel without dilatation. No bowel obstruction. 2. Cardiomegaly with small right pleural effusion. Electronically Signed   By: Darliss Cheney M.D.   On: 10/03/2022 22:11   DG Chest 1 View  Result Date: 10/03/2022 CLINICAL DATA:  Shortness of breath, abdominal pain EXAM: CHEST  1 VIEW COMPARISON:  06/20/2022 FINDINGS: Left AICD remains in place, unchanged. Prior median sternotomy and valve replacement. Cardiomegaly, vascular congestion.  Bilateral airspace disease, favor edema. No effusions or acute bony abnormality. IMPRESSION: Cardiomegaly with bilateral airspace disease, likely edema. Electronically Signed   By: Charlett Nose M.D.   On: 10/03/2022 22:09   US Abdomen Limited RUQ (LIVER/GB)  Result Date: 10/03/2022 CLINICAL DATA:  Right upper quadrant abdominal pain for 2 weeks EXAM: ULTRASOUND ABDOMEN LIMITED RIGHT UPPER QUADRANT COMPARISON:  CT abdomen pelvis 03/01/2021 FINDINGS: Gallbladder: Status post cholecystectomy Common bile duct: Diameter: 2 mm Liver: Parenchymal echogenicity: Within normal limits Contours: Mildly nodular Lesions: None Portal vein: Patent.  Hepatopetal flow Other: 1.1 and 0.9 cm simple cyst in the upper pole the right kidney do not require dedicated imaging follow-up. Right pleural effusion is partially visualized. IMPRESSION: Nodular hepatic contour, suspicious for cirrhosis. Electronically Signed   By: Acquanetta Belling M.D.   On: 10/03/2022 16:49    Cardiac Studies   Echo 09/08/2022  1. Left ventricular ejection fraction, by estimation, is 20 to 25%. The  left ventricle has severely decreased function. The left ventricle  demonstrates global hypokinesis. The left ventricular internal cavity size  was mildly dilated. Left ventricular  diastolic parameters are indeterminate.   2. Right ventricular systolic function is normal. The right ventricular  size is normal.   3. Left atrial size was severely dilated.   4. The mitral valve has been repaired/replaced. No evidence of mitral  valve regurgitation. No evidence of mitral stenosis. Echo findings are  consistent with normal structure and function of the mitral valve  prosthesis.   5. Tricuspid valve regurgitation is moderate.   6. The aortic valve is tricuspid. There is mild calcification of the  aortic valve. There is mild thickening of the aortic valve. Aortic valve  regurgitation is mild. Aortic valve sclerosis is present, with no evidence  of aortic  valve stenosis. Aortic  regurgitation PHT measures 379 msec.   7. The inferior vena cava is normal in size with greater than 50%  respiratory variability, suggesting right atrial pressure of 3 mmHg.   Comparison(s): Prior images reviewed side by side.    Patient Profile     82 y.o. female with a hx of mechanical mitral valve (repaired 2002, replacement 2007), history of systolic heart failure s/p Medtronic biventricular ICD, hypertension, hyperlipidemia, CKD stage III, PAF, hypothyroidism and COPD who presented with abdominal discomfort and DOE for the past week. BNP 3900. Elevated liver enzyme was felt to be  due to hepatic congestion.   Assessment & Plan    Acute on chronic systolic heart failure  - Patient has a EF as low as 10% at some point in the past, however EF went up to 45 to 50% after CRT-D.  Repeat echocardiogram in May 2023 showed EF dropped down to 30 to 35%.  This was felt to be related to reduction in effective biventricular pacing.  Beta-blocker was increased.  Biventricular pacing has increased to 96% on the most recent interrogation.  Unfortunately EF further dropped down to the 20 to 25% on most recent echocardiogram on 09/08/2022.  Patient does complain of chest tightness with physical exertion.             -Recent OptiVol reading also indicated of volume accumulation.    -I/O and weight not accurate. Breathing unchanged overnight. Bibasilar crackles, unclear if fluid vs atelectasis. Discussed with the patient regarding L&R heart cath.   Risk and benefit of procedure explained to the patient who display clear understanding and agree to proceed.  Discussed with patient possible procedural risk include bleeding, vascular injury, renal injury, arrythmia, MI, stroke and loss of limb or life.  History of mechanical mitral valve replacement 2007: Stable on last echocardiogram  History of Medtronic biventricular ICD: On Coumadin   Hypertension: Blood pressure well-controlled.   Continue metoprolol succinate and spironolactone   Hyperlipidemia: On Vascepa   CKD stage III   COPD   PAF: On Coumadin.      For questions or updates, please contact [facility] Please consult [email or web address] for contact info under        Signed, Azalee Course, PA  10/05/2022, 8:35 AM

## 2022-10-06 ENCOUNTER — Other Ambulatory Visit (HOSPITAL_COMMUNITY): Payer: Self-pay

## 2022-10-06 DIAGNOSIS — I480 Paroxysmal atrial fibrillation: Secondary | ICD-10-CM | POA: Diagnosis not present

## 2022-10-06 DIAGNOSIS — R7401 Elevation of levels of liver transaminase levels: Secondary | ICD-10-CM | POA: Diagnosis not present

## 2022-10-06 DIAGNOSIS — R7989 Other specified abnormal findings of blood chemistry: Secondary | ICD-10-CM | POA: Diagnosis not present

## 2022-10-06 DIAGNOSIS — I5043 Acute on chronic combined systolic (congestive) and diastolic (congestive) heart failure: Secondary | ICD-10-CM | POA: Diagnosis not present

## 2022-10-06 DIAGNOSIS — Z952 Presence of prosthetic heart valve: Secondary | ICD-10-CM | POA: Diagnosis not present

## 2022-10-06 LAB — COMPREHENSIVE METABOLIC PANEL
ALT: 232 U/L — ABNORMAL HIGH (ref 0–44)
AST: 117 U/L — ABNORMAL HIGH (ref 15–41)
Albumin: 2.9 g/dL — ABNORMAL LOW (ref 3.5–5.0)
Alkaline Phosphatase: 124 U/L (ref 38–126)
Anion gap: 13 (ref 5–15)
BUN: 29 mg/dL — ABNORMAL HIGH (ref 8–23)
CO2: 25 mmol/L (ref 22–32)
Calcium: 9.2 mg/dL (ref 8.9–10.3)
Chloride: 96 mmol/L — ABNORMAL LOW (ref 98–111)
Creatinine, Ser: 1.4 mg/dL — ABNORMAL HIGH (ref 0.44–1.00)
GFR, Estimated: 38 mL/min — ABNORMAL LOW (ref >60)
Glucose, Bld: 90 mg/dL (ref 70–99)
Potassium: 5 mmol/L (ref 3.5–5.1)
Sodium: 134 mmol/L — ABNORMAL LOW (ref 135–145)
Total Bilirubin: 0.7 mg/dL (ref 0.3–1.2)
Total Protein: 6.4 g/dL — ABNORMAL LOW (ref 6.5–8.1)

## 2022-10-06 LAB — CBC WITH DIFFERENTIAL/PLATELET
Abs Immature Granulocytes: 0.09 10*3/uL — ABNORMAL HIGH (ref 0.00–0.07)
Basophils Absolute: 0 10*3/uL (ref 0.0–0.1)
Basophils Relative: 1 %
Eosinophils Absolute: 0.4 10*3/uL (ref 0.0–0.5)
Eosinophils Relative: 4 %
HCT: 33.3 % — ABNORMAL LOW (ref 36.0–46.0)
Hemoglobin: 10.9 g/dL — ABNORMAL LOW (ref 12.0–15.0)
Immature Granulocytes: 1 %
Lymphocytes Relative: 11 %
Lymphs Abs: 1 10*3/uL (ref 0.7–4.0)
MCH: 31.1 pg (ref 26.0–34.0)
MCHC: 32.7 g/dL (ref 30.0–36.0)
MCV: 94.9 fL (ref 80.0–100.0)
Monocytes Absolute: 0.8 10*3/uL (ref 0.1–1.0)
Monocytes Relative: 9 %
Neutro Abs: 6.6 10*3/uL (ref 1.7–7.7)
Neutrophils Relative %: 74 %
Platelets: 301 10*3/uL (ref 150–400)
RBC: 3.51 MIL/uL — ABNORMAL LOW (ref 3.87–5.11)
RDW: 15.9 % — ABNORMAL HIGH (ref 11.5–15.5)
WBC: 8.8 10*3/uL (ref 4.0–10.5)
nRBC: 0.2 % (ref 0.0–0.2)

## 2022-10-06 LAB — BRAIN NATRIURETIC PEPTIDE: B Natriuretic Peptide: 2072.8 pg/mL — ABNORMAL HIGH (ref 0.0–100.0)

## 2022-10-06 LAB — MAGNESIUM: Magnesium: 2.3 mg/dL (ref 1.7–2.4)

## 2022-10-06 LAB — HEPARIN LEVEL (UNFRACTIONATED): Heparin Unfractionated: 0.1 IU/mL — ABNORMAL LOW (ref 0.30–0.70)

## 2022-10-06 LAB — C-REACTIVE PROTEIN: CRP: 1.7 mg/dL — ABNORMAL HIGH (ref <1.0)

## 2022-10-06 LAB — PROTIME-INR
INR: 1.9 — ABNORMAL HIGH (ref 0.8–1.2)
Prothrombin Time: 21.6 seconds — ABNORMAL HIGH (ref 11.4–15.2)

## 2022-10-06 MED ORDER — HEPARIN (PORCINE) 25000 UT/250ML-% IV SOLN
900.0000 [IU]/h | INTRAVENOUS | Status: DC
  Administered 2022-10-06: 600 [IU]/h via INTRAVENOUS
  Filled 2022-10-06 (×2): qty 250

## 2022-10-06 MED ORDER — HEPARIN BOLUS VIA INFUSION
1500.0000 [IU] | Freq: Once | INTRAVENOUS | Status: AC
  Administered 2022-10-06: 1500 [IU] via INTRAVENOUS
  Filled 2022-10-06: qty 1500

## 2022-10-06 NOTE — Progress Notes (Signed)
Heart Failure Navigator Progress Note  Following this hospitalization to assess for HV TOC readiness.   EF 20-25 % HFrEF Possible heart cath this admission. Plan to interview for HF [place] appointment  Rhae Hammock, BSN, RN Heart Failure Nurse Navigator Secure Chat Only

## 2022-10-06 NOTE — Progress Notes (Signed)
ANTICOAGULATION CONSULT NOTE  Pharmacy Consult for Heparin when INR < 2.5 Indication:  mechanical valve  Allergies  Allergen Reactions   Ace Inhibitors Cough   Lipitor [Atorvastatin Calcium] Other (See Comments)    Knee pain    Megace [Megestrol] Other (See Comments)    High blood pressure and blurred vision   Aspirin Other (See Comments)    Nephrologist said to not take this   Clorazepate Dipotassium Other (See Comments)    Interacts with a drug being taken   Simvastatin Other (See Comments)    Muscle pain   Tiotropium Bromide Monohydrate Other (See Comments)    Dry mouth   Codeine Nausea And Vomiting   Tape Rash   Patient Measurements: Height: 5\' 2"  (157.5 cm) Weight: 42.1 kg (92 lb 13 oz) IBW/kg (Calculated) : 50.1  Vital Signs: Temp: 97.2 F (36.2 C) (05/06 0333) Temp Source: Oral (05/06 0333) BP: 136/65 (05/06 0333) Pulse Rate: 75 (05/06 0333)  Labs: Recent Labs    10/03/22 1332 10/04/22 0259 10/04/22 0740 10/05/22 0254 10/05/22 0318 10/06/22 0321  HGB 9.9*  --   --   --  10.6* 10.9*  HCT 30.8*  --   --   --  32.3* 33.3*  PLT 271  --   --   --  276 301  LABPROT 35.8*  --  35.9* 27.2*  --  21.6*  INR 3.6*  --  3.6* 2.6*  --  1.9*  CREATININE 1.41* 1.31*  --  1.26*  --  1.40*    Estimated Creatinine Clearance: 21.7 mL/min (A) (by C-G formula based on SCr of 1.4 mg/dL (H)).  Medical History: Past Medical History:  Diagnosis Date   Allergy    seasonal   Anemia    Anxiety    Arthritis    Asthma    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 Protecta   Biventricular ICD (implantable cardioverter-defibrillator) in place    with CRT   Blood transfusion without reported diagnosis    Bursitis    Cataract    RIGHT EYE   CHF (congestive heart failure) (HCC)    Chronic kidney disease    Colon polyp    adenomatous   Complication of anesthesia    patient stated that had difficulty getting the breathing tube removed, patient  said that she stopped breathing and HR dropped to 10  patient then woke up and started breathing pateint stated no longer than one minute; re-intubated in PACU following cholecystectomy 10/28/13   COPD (chronic obstructive pulmonary disease) (HCC)    Depression    Diverticulosis    Dysrhythmia    Fibromyalgia    GERD (gastroesophageal reflux disease)    Gout    H/O mitral valve replacement 2002, 2007   Heart murmur    Hemorrhoids    Hyperlipidemia    Hypertension    Hypothyroidism    IBS (irritable bowel syndrome)    MVP (mitral valve prolapse)    NAUSEA AND VOMITING 08/01/2009   Neuromuscular disorder (HCC)    fibromyalgia   Pacemaker    Peptic ulcer disease    Medications:  Facility-Administered Medications Prior to Admission  Medication Dose Route Frequency Provider Last Rate Last Admin   0.9 %  sodium chloride infusion  500 mL Intravenous Continuous Hilarie Fredrickson, MD       Medications Prior to Admission  Medication Sig Dispense Refill Last Dose   acetaminophen (TYLENOL) 650 MG CR tablet Take 650 mg by  mouth every 8 (eight) hours as needed for pain.   10/02/2022   allopurinol (ZYLOPRIM) 300 MG tablet Take 300 mg by mouth every evening.    10/02/2022   ALPRAZolam (XANAX) 0.25 MG tablet Take 0.25 mg by mouth at bedtime as needed for anxiety or sleep.    10/02/2022   antiseptic oral rinse (BIOTENE) LIQD 15 mLs by Mouth Rinse route as needed for dry mouth.   10/02/2022   buPROPion (WELLBUTRIN SR) 150 MG 12 hr tablet Take 150 mg by mouth in the morning.   10/02/2022   esomeprazole (NEXIUM) 40 MG capsule TAKE 1 CAPSULE BY MOUTH EVERY DAY BEFORE BREAKFAST (Patient taking differently: Take 40 mg by mouth daily before breakfast.) 90 capsule 0 10/02/2022   fluticasone (FLONASE) 50 MCG/ACT nasal spray Place 1 spray into both nostrils as needed for allergies.   10/02/2022   furosemide (LASIX) 20 MG tablet Take 1 tablet (20 mg total) by mouth daily. 30 tablet 0 10/02/2022   furosemide (LASIX) 40 MG tablet  TAKE 1 TABLET BY MOUTH DAILY. **NEEDS OV FOR FUTURE REFILLS** (Patient taking differently: Take 20 mg by mouth daily.) 90 tablet 2 10/02/2022   icosapent Ethyl (VASCEPA) 1 g capsule Take 1 capsule (1 g total) by mouth 2 (two) times daily. 60 capsule 11 10/02/2022   ipratropium (ATROVENT) 0.06 % nasal spray Place 2 sprays into both nostrils 2 (two) times daily.   10/02/2022   levothyroxine (SYNTHROID) 75 MCG tablet Take 75 mcg by mouth daily before breakfast.   10/02/2022   memantine (NAMENDA) 5 MG tablet Take 5 mg by mouth every morning.   10/02/2022   metoCLOPramide (REGLAN) 5 MG tablet Take 5 mg by mouth 2 (two) times daily as needed for nausea or vomiting.   10/02/2022   metoprolol succinate (TOPROL-XL) 50 MG 24 hr tablet Take 1 tablet (50 mg total) by mouth 2 (two) times daily. Take with or immediately following a meal. 180 tablet 3 10/02/2022 at 17:00   Multiple Vitamin (MULTIVITAMIN WITH MINERALS) TABS tablet Take 1 tablet by mouth daily.   10/02/2022   phenylephrine (SUDAFED PE) 10 MG TABS tablet Take 10 mg by mouth at bedtime as needed (Congestion).   10/02/2022   pravastatin (PRAVACHOL) 40 MG tablet Take 40 mg at bedtime by mouth.    10/02/2022   Sennosides (SENNA) 15 MG TABS Take 30 mg by mouth at bedtime.   10/02/2022   spironolactone (ALDACTONE) 25 MG tablet Take 1 tablet (25 mg total) by mouth daily. 90 tablet 3 10/02/2022   VOLTAREN 1 % GEL Apply 2-4 g topically in the morning and at bedtime.   10/02/2022   warfarin (COUMADIN) 2.5 MG tablet TAKE 1 TO 2 TABLETS BY MOUTH DAILY AS DIRECTED BY THE COUMADIN CLINIC (Patient taking differently: Take 2.5 mg by mouth daily. Take 1 to 2 tablets by mouth daily as directed by the coumadin clinic) 135 tablet 1 Past Week at 18:00   PROAIR HFA 108 (90 BASE) MCG/ACT inhaler Inhale 1-2 puffs into the lungs every 6 (six) hours as needed for wheezing.      Scheduled:   allopurinol  300 mg Oral QPM   buPROPion  150 mg Oral q AM   feeding supplement  237 mL Oral BID BM    furosemide  80 mg Intravenous BID   icosapent Ethyl  1 g Oral BID   levothyroxine  75 mcg Oral Q0600   memantine  5 mg Oral q morning   metoprolol succinate  50 mg Oral BID   ondansetron  4 mg Oral Once   pantoprazole  80 mg Oral Q1200   pneumococcal 20-valent conjugate vaccine  0.5 mL Intramuscular Tomorrow-1000   spironolactone  25 mg Oral Daily   Warfarin - Pharmacist Dosing Inpatient   Does not apply q1600   Infusions:    PRN: ALPRAZolam  Assessment: 55 yof with a history of  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 Patient is presenting with abdominal pain. Patient taking warfarin prior to arrival. Home dose is 5 mg (2.5 mg x 2) every Sun; 2.5 mg (2.5 mg x 1) all other days. INR was 8 and 7.3 on 5/1. Was instructed to hold 5/1-5/3 then resume usual dose until re-check. INR therapeutic at 2.6 today.   Pharmacy consulted to transition to heparin when INR<2.5 to allow for cardiac cath next week. INR 1.9 this AM.  Goal of Therapy:  Heparin level 0.3 - 0.7 Monitor platelets by anticoagulation protocol: Yes   Plan:  Heparin 1500 units IV bolus x 1 Heparin IV infusion 600 units/hr Check heparin level in 8hrs Daily heparin level, CBC, INR Follow up plans for cardiac cath  Loralee Pacas, PharmD, BCPS Please see amion for complete clinical pharmacist phone list 10/06/2022 8:11 AM

## 2022-10-06 NOTE — Progress Notes (Signed)
   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 She also reports dyspnea this week requiring up to 3 pillows when she sleeps and felt her legs have been more edematous. She takes 20 mg of Lasix daily and missed just 1 dose the night prior to admission. CXR also showing vascular congestion. Recently her LVEF had declined further down to 20 to 25% after initially improving to 45 to 50% after CRT-D therapy in 2021. BNP 3914. Echo from 4/8 showing EF 20-25%, global hypokinesis, normal RV, severely dilated left atrial size, mild AVR, and moderate TVR. Planning for [facility] tomorrow, 5/7.   Current HF Medications: Diuretic: furosemide 80 mg IV BID Beta Blocker: metoprolol succinate 50 mg BID MRA: spironolactone 25 mg daily  Prior to admission HF Medications: Diuretic: furosemide 20 mg PO daily Beta blocker: metoprolol succinate 50 mg BID MRA: spironolactone 25 mg daily  Pertinent Lab Values: Serum creatinine 1.40, BUN 29, Potassium 5.0, Sodium 134, BNP 2072, Magnesium 2.3  Vital Signs: Weight: 92 lbs (admission weight: 92 lbs) Blood pressure: 130s/60s  Heart rate: 70s  I/O: -780 mL yesterday; net -790 mL  Medication Assistance / Insurance Benefits Check: Does the patient have prescription insurance?  Yes Type of insurance plan: Medicare  Does the patient qualify for medication assistance through manufacturers or grants?   Yes Eligible grants and/or patient assistance programs: None  Medication assistance applications in progress: None currently   Medication assistance applications approved: None Approved medication assistance renewals will be completed by:  N/A  Outpatient Pharmacy:  Prior to admission outpatient pharmacy: [facility] Is the patient willing to use [facility] TOC pharmacy at discharge? Yes Is the patient willing to transition their outpatient pharmacy to utilize a [facility] outpatient pharmacy? No   Assessment: 1. Acute on chronic systolic CHF (LVEF 20-25%), due to NICM. NYHA class IV symptoms. - Scr 1.26>>1.40 from yesterday (~BL), on furosemide 80 mg IV BID. Strict I's and O's. Keep K >4 and Mg >2.  - Continue furosemide 80 mg IV BID - Continue Toprol-XL 50mg  BID - Continue Spironolactone 25mg  daily - Consider adding Entresto and SGLT2i if Scr improves post-diuresis/cath    Plan: 1) Medication changes recommended at this time: - Consider adding Entresto and SGLT2i once Scr improves post-diuresis and after [facility] 5/7  2) Patient assistance: - None  3)  Education  - To be completed prior to discharge   [name], PharmD PGY1 Pharmacy Resident 5/6/202412:13 PM

## 2022-10-06 NOTE — Progress Notes (Addendum)
Rounding Note    Patient Name: [name] Date of Encounter: 10/06/2022  [facility] Cardiologist: Thurmon Fair, MD   Subjective   No acute overnight events. She reports some chest pain with coughing that she describes as phlegm being stuck in her chest but no other chest pain. Her breathing is at baseline. Abdominal pain has resolved.  Inpatient Medications    Scheduled Meds:  allopurinol  300 mg Oral QPM   buPROPion  150 mg Oral q AM   feeding supplement  237 mL Oral BID BM   furosemide  80 mg Intravenous BID   heparin  1,500 Units Intravenous Once   icosapent Ethyl  1 g Oral BID   levothyroxine  75 mcg Oral Q0600   memantine  5 mg Oral q morning   metoprolol succinate  50 mg Oral BID   ondansetron  4 mg Oral Once   pantoprazole  80 mg Oral Q1200   pneumococcal 20-valent conjugate vaccine  0.5 mL Intramuscular Tomorrow-1000   spironolactone  25 mg Oral Daily   Warfarin - Pharmacist Dosing Inpatient   Does not apply q1600   Continuous Infusions:  heparin     PRN Meds: ALPRAZolam   Vital Signs    Vitals:   10/05/22 2018 10/06/22 0022 10/06/22 0333 10/06/22 0500  BP: 122/60 132/70 136/65   Pulse: 71 72 75   Resp: 17 17 18    Temp: 98.1 F (36.7 C) 98.1 F (36.7 C) (!) 97.2 F (36.2 C)   TempSrc: Oral Oral Oral   SpO2: 93% 98% 96%   Weight:    42.1 kg  Height:        Intake/Output Summary (Last 24 hours) at 10/06/2022 0834 Last data filed at 10/06/2022 0704 Gross per 24 hour  Intake 940 ml  Output 2050 ml  Net -1110 ml      10/06/2022    5:00 AM 10/05/2022    4:49 AM 10/03/2022    1:11 PM  Last 3 Weights  Weight (lbs) 92 lb 13 oz 99 lb 13.9 oz 92 lb  Weight (kg) 42.1 kg 45.3 kg 41.731 kg      Telemetry    V paced rhythm with intermittent atrial pacing.  - Personally Reviewed  ECG    No new ECG tracings. - Personally Reviewed  Physical Exam   GEN: Thin Caucasian female resting comfortably in no no acute distress.   Neck: No  JVD. Cardiac: RRR. Mechanical click noted. No murmurs, rubs, or gallops.  Respiratory: No increased work of breathing.  Bibasilar crackles (left > right). No wheezes or rhonchi.  GI: Soft, non-distended, and non-tender. MS: No lower extremity edema. No deformity. Skin: Warm and dry. Neuro:  No focal deficits. Psych: Normal affect. Responds appropriately.  Labs    High Sensitivity Troponin:  No results for input(s): "TROPONINIHS" in the last 720 hours.   Chemistry Recent Labs  Lab 10/04/22 0259 10/05/22 0254 10/06/22 0321  NA 131* 133* 134*  K 4.1 3.1* 5.0  CL 96* 98 96*  CO2 22 25 25   GLUCOSE 148* 83 90  BUN 25* 27* 29*  CREATININE 1.31* 1.26* 1.40*  CALCIUM 8.6* 8.6* 9.2  MG  --  1.4* 2.3  PROT 6.2* 5.9* 6.4*  ALBUMIN 2.6* 2.7* 2.9*  AST 262* 167* 117*  ALT 333* 283* 232*  ALKPHOS 136* 135* 124  BILITOT 1.6* 0.8 0.7  GFRNONAA 41* 43* 38*  ANIONGAP 13 10 13     Lipids No  results for input(s): "CHOL", "TRIG", "HDL", "LABVLDL", "LDLCALC", "CHOLHDL" in the last 168 hours.  Hematology Recent Labs  Lab 10/03/22 1332 10/05/22 0318 10/06/22 0321  WBC 6.5 8.2 8.8  RBC 3.24* 3.50* 3.51*  HGB 9.9* 10.6* 10.9*  HCT 30.8* 32.3* 33.3*  MCV 95.1 92.3 94.9  MCH 30.6 30.3 31.1  MCHC 32.1 32.8 32.7  RDW 14.9 15.1 15.9*  PLT 271 276 301   Thyroid No results for input(s): "TSH", "FREET4" in the last 168 hours.  BNP Recent Labs  Lab 10/04/22 0259 10/05/22 0318 10/06/22 0321  BNP 3,914.7* 3,750.4* 2,072.8*    DDimer No results for input(s): "DDIMER" in the last 168 hours.   Radiology    No results found.  Cardiac Studies   Echocardiogram 09/08/2022: Impressions:  1. Left ventricular ejection fraction, by estimation, is 20 to 25%. The  left ventricle has severely decreased function. The left ventricle  demonstrates global hypokinesis. The left ventricular internal cavity size  was mildly dilated. Left ventricular  diastolic parameters are indeterminate.   2. Right  ventricular systolic function is normal. The right ventricular  size is normal.   3. Left atrial size was severely dilated.   4. The mitral valve has been repaired/replaced. No evidence of mitral  valve regurgitation. No evidence of mitral stenosis. Echo findings are  consistent with normal structure and function of the mitral valve  prosthesis.   5. Tricuspid valve regurgitation is moderate.   6. The aortic valve is tricuspid. There is mild calcification of the  aortic valve. There is mild thickening of the aortic valve. Aortic valve  regurgitation is mild. Aortic valve sclerosis is present, with no evidence  of aortic valve stenosis. Aortic  regurgitation PHT measures 379 msec.   7. The inferior vena cava is normal in size with greater than 50%  respiratory variability, suggesting right atrial pressure of 3 mmHg.   Comparison(s): Prior images reviewed side by side.    Patient Profile     82 y.o. female with a history of normal coronaries on cardiac catheterization in 2007, chronic systolic CHF with EF as low as 10% in the past but  30-35% on Echo in 09/2021 s/p [employer] BiV ICD for primary prevention of sudden cardiac death),  paroxysmal atrial fibrillation on Coumadin, mechanical mitral valve (repair in 2002 and then replaced in 2007), COPD, IBS, hypertension, hyperlipidemia, CKD stage III, and hypothyroidism who was admitted on 10/03/2022 for acute on chronic diastolic CHF and acute transaminitis felt to be due to hepatic congestion (per GI) after presenting with abdominal pain as well as increasing dyspnea on exertion. Cardiology was consulted to assist with acute on chronic CHF.   Assessment & Plan    Acute on Chronic Systolic CHF S/p ICD for Primary Prevention of Sudden Cardiac Death Patient has a long history of CHF with EF as low as 10% in the past. However, EF improved to 45-50% after CRT-D. Repeat Echo in 09/2021 showed LVEF had dropped back down to 30-35% which was felt to be  related to reduction in effective BiV pacing. Most recent Echo on 09/08/2022 showed LVEF of 20-25% with global hypokinesis, normal RV, severely dilated left atrial size, mild AI, and moderate TR.  Patient presented with abdominal pain but also reported increased dyspnea on exertion and found to be volume overload. BNP markedly elevated at 3,914 >> 3,750 >> 2,072. Chest x-ray showed cardiomegaly with bilateral airspace disease (likely edema). Optivol readings having been increasing recently. She is current being diuresed  with IV Lasix. Documented urinary output of 1.85 L yesterday but only net negative 790 mL this admission. Don't know if documented weight are accurate. Creatinine slightly up from yesterday but overall stable. - Crackles on exam but otherwise euvolemic.  - Continue IV Lasix 80mg  twice daily. - Continue Toprol-XL 50mg  daily.  - Continue Spironolactone 25mg  daily. - Creatinine starting to rise so will hold on adding ARB or Entresto for now to allow for continued diuresis. Could consider adding SGLT 2 inhibitor.  - Continue to monitor daily weights, strict I/Os, and renal function. - Plan is for R/ LHC once INR at appropriate level. Patient consent for procedure yesterday. INR 1.9 today. She has already eaten today so will plan for [facility] tomorrow.   S/p Mechanical Valve Replacement in 2007 Echo this admission showed stable normal structure and function of MV. - On Coumadin at home. Currently on hold in anticipation for cardiac catheterization. INR 1.9 today so she has been started on IV Heparin.  Hypertension BP well controlled.  - Continue medications for CHF as above.  Hyperlipidemia Familial Hypertriglyceridemia Lipid panel in 09/2022: Total Cholesterol 273, Triglycerides 383, HDL 34, LDL 165.  - Intolerant to high-intensity statins. On Pravastatin 40mg  daily at home but currently on hold given elevated LFTs. - Continue Vascepa 2g twice daily. - She follows with Dr. Rennis Golden in his  [facility] and it sounds like there has been some issues with non-compliance in the past.  Elevated LFTs AST 356, ALT 343, Alk Phos 142 on admission. Abdominal x-ray showed gaseous distention of small bowel without dilatation and no bowel obstruction. Hepatitis panel negative. ETOH negative. UDS negative. ED provider reportedly spoke with GI who felt like this was due to hepatic congestion from CHF. - Improving. - Continue to hold statin. - Continue diuresis as above.  CKD Stage III Creatinine 1.41 on admission which seems to be around baseline. - Stable. - Continue to monitor closely.  Otherwise, per primary team: - COPD - IBS - Hypothyroidism    For questions or updates, please contact [facility] Please consult [email or web address] for contact info under        Signed, Corrin Parker, PA-C  10/06/2022, 8:34 AM      Attending Addendum:  History and all data above reviewed.  Patient examined.  I agree with the findings as above.  All available labs, radiology testing, previous records reviewed. Agree with documented assessment and plan. [name] is a 51F with HFrEF, COPD, s/p CRT-D, PAF, mechanical mitral valve, HTN, HL and CKD 3 admitted with acute on chronic HFrEF and transaminitis 2/2 hepatic congestion.  Clinically her volume status is improving with diuresis though BNP is still 2000 today.  Continue with diuresis.  Plan for [facility] tomorrow.  INR today is 1.9.  Given her small frame, would like INR to be closer to 1.5 in case she need to be done femoral.  Continue IV heparin.    Stephan Nelis C. Duke Salvia, MD, [facility]  10/06/2022 12:06 PM

## 2022-10-06 NOTE — Progress Notes (Signed)
PROGRESS NOTE        PATIENT DETAILS Name: [name] Age: 82 y.o. Sex: female Date of Birth: 1940-10-26 Admit Date: 10/03/2022 Admitting Physician Anselm Jungling, DO WUJ:[id number], Barry Dienes, MD  Brief Summary: Patient is a 81 y.o.  female with history of HFrEF, mitral valve replacement-mechanical valve on Coumadin, COPD, HTN, PAF-who presented with abdominal pain with worsening orthopnea and exertional dyspnea x 7 days--found to have elevated transaminases-with concern for either congestive hepatitis or Tylenol toxicity-started on Mucomyst infusion after discussion with poison control and admitted to the hospitalist service.   Significant events: 5/3>> admit to [facility]  Significant studies: 5/3>> RUQ ultrasound: S/p cholecystectomy-nodular hepatic contour suspicious for cirrhosis 5/3>> CXR: Bilateral airspace disease-likely pulm edema 5/3>> x-ray abdomen: No bowel obstruction 5/3>> acute hepatitis serology: Negative 5/3>> lipase: Negative 5/3>> Tylenol level:<10  Significant microbiology data: None  Procedures: None  Consults: Cardiology  Subjective:  Patient in bed, appears comfortable, denies any headache, no fever, no chest pain or pressure, no shortness of breath , no abdominal pain. No new focal weakness.   Objective: Vitals: Blood pressure 136/65, pulse 75, temperature (!) 97.2 F (36.2 C), temperature source Oral, resp. rate 18, height 5\' 2"  (1.575 m), weight 42.1 kg, SpO2 96 %.   Exam:  Awake Alert, No new F.N deficits, Normal affect .AT,PERRAL Supple Neck, No JVD,   Symmetrical Chest wall movement, Good air movement bilaterally, CTAB RRR,No Gallops, Rubs or new Murmurs,  +ve B.Sounds, Abd Soft, No tenderness,   No Cyanosis, Clubbing or edema    Assessment/Plan:  Elevated transaminases  -  etiology-likely from CHF and congestive otitis, acute hepatitis serology negative, she has had cholecystectomy in the past, seen by GI  remotely, per [facility] nothing else to offer except diuresis as being done by cardiology.  LFTs are downtrending.  There was suspicion for possible mild Tylenol toxicity for which she has finished Mucomyst infusion.  Continue trending LFTs and INR.   Acute on chronic combined systolic and diastolic heart failure EF around 20 to 25%, has AICD.  No peripheral signs of volume overload-but CXR with pulm edema-significantly elevated BNP levels.  Cardiology on board and directing diuretic dose.  Much improved clinically.  Cards is contemplating cardiac catheterization this admission  PAF with [place] vas 2 score of greater than 4 Beta-blocker Coumadin for pharmacy-INR therapeutic, will be transition to heparin once INR reaches below 2.5 as instructed by cardiology as they are contemplating cardiac catheterization this admission.  Mechanical mitral valve replacement 2002 Goal INR 2.5-3.5 Coumadin per pharmacy  Hypokalemia and hypomagnesemia.  Replaced.    CKD stage IIIb At baseline  COPD Not in exacerbation Bronchodilators  HTN BP stable Beta-blocker/Aldactone/Lasix  Hypothyroidism Levothyroxine  Mood disorder Appears stable Wellbutrin/Xanax  Underweight: Estimated body mass index is 16.98 kg/m as calculated from the following:   Height as of this encounter: 5\' 2"  (1.575 m).   Weight as of this encounter: 42.1 kg.   Code status:   Code Status: Prior   DVT Prophylaxis:heparin bolus via infusion 1,500 Units Start: 10/06/22 0915    Family Communication: None at bedside   Disposition Plan: Status is: Observation The patient will require care spanning > 2 midnights and should be moved to inpatient because: Severity of illness   Planned Discharge Destination:Home   Diet: Diet Order  Diet NPO time specified  Diet effective midnight           Diet Heart Room service appropriate? Yes; Fluid consistency: Thin  Diet effective now                      Antimicrobial agents: Anti-infectives (From admission, onward)    None        MEDICATIONS: Scheduled Meds:  allopurinol  300 mg Oral QPM   buPROPion  150 mg Oral q AM   feeding supplement  237 mL Oral BID BM   furosemide  80 mg Intravenous BID   heparin  1,500 Units Intravenous Once   icosapent Ethyl  1 g Oral BID   levothyroxine  75 mcg Oral Q0600   memantine  5 mg Oral q morning   metoprolol succinate  50 mg Oral BID   ondansetron  4 mg Oral Once   pantoprazole  80 mg Oral Q1200   pneumococcal 20-valent conjugate vaccine  0.5 mL Intramuscular Tomorrow-1000   spironolactone  25 mg Oral Daily   Warfarin - Pharmacist Dosing Inpatient   Does not apply q1600   Continuous Infusions:  heparin     PRN Meds:.ALPRAZolam   I have personally reviewed following labs and imaging studies  LABORATORY DATA:  Recent Labs  Lab 10/03/22 1332 10/05/22 0318 10/06/22 0321  WBC 6.5 8.2 8.8  HGB 9.9* 10.6* 10.9*  HCT 30.8* 32.3* 33.3*  PLT 271 276 301  MCV 95.1 92.3 94.9  MCH 30.6 30.3 31.1  MCHC 32.1 32.8 32.7  RDW 14.9 15.1 15.9*  LYMPHSABS  --  0.9 1.0  MONOABS  --  0.7 0.8  EOSABS  --  0.4 0.4  BASOSABS  --  0.0 0.0    Recent Labs  Lab 10/01/22 1200 10/03/22 1332 10/04/22 0259 10/04/22 0740 10/05/22 0254 10/05/22 0318 10/06/22 0321  NA  --  132* 131*  --  133*  --  134*  K  --  3.9 4.1  --  3.1*  --  5.0  CL  --  95* 96*  --  98  --  96*  CO2  --  24 22  --  25  --  25  ANIONGAP  --  13 13  --  10  --  13  GLUCOSE  --  91 148*  --  83  --  90  BUN  --  24* 25*  --  27*  --  29*  CREATININE  --  1.41* 1.31*  --  1.26*  --  1.40*  AST  --  356* 262*  --  167*  --  117*  ALT  --  343* 333*  --  283*  --  232*  ALKPHOS  --  142* 136*  --  135*  --  124  BILITOT  --  0.9 1.6*  --  0.8  --  0.7  ALBUMIN  --  3.1* 2.6*  --  2.7*  --  2.9*  CRP  --   --   --   --  2.7*  --   --   INR 7.3* 3.6*  --  3.6* 2.6*  --  1.9*  BNP  --   --  3,914.7*  --   --   3,750.4* 2,072.8*  MG  --   --   --   --  1.4*  --  2.3  CALCIUM  --  8.7* 8.6*  --  8.6*  --  9.2     Recent Labs  Lab 10/01/22 1200 10/03/22 1332 10/04/22 0259 10/04/22 0740 10/05/22 0254 10/05/22 0318 10/06/22 0321  CRP  --   --   --   --  2.7*  --   --   INR 7.3* 3.6*  --  3.6* 2.6*  --  1.9*  BNP  --   --  3,914.7*  --   --  3,750.4* 2,072.8*  MG  --   --   --   --  1.4*  --  2.3  CALCIUM  --  8.7* 8.6*  --  8.6*  --  9.2     RADIOLOGY STUDIES/RESULTS: No results found.   LOS: 2 days   Signature  -    Susa Raring M.D on 10/06/2022 at 8:44 AM   -  To page go to [email or web address]

## 2022-10-06 NOTE — Progress Notes (Signed)
Mobility Specialist - Progress Note   10/06/22 0951  Mobility  Activity Ambulated with assistance in room  Level of Assistance Standby assist, set-up cues, supervision of patient - no hands on  Assistive Device Four wheel walker  Distance Ambulated (ft) 10 ft  Activity Response Tolerated well  Mobility Referral Yes  $Mobility charge 1 Mobility   Pt was received at sink and needing assistance with gown change. Pt with no complaints. Pt was able to ambulate in room. Pt was returned to bed with all needs met and husband present.   Alda Lea  Mobility Specialist Please contact via Special educational needs teacher or Rehab office at [phone]

## 2022-10-06 NOTE — Progress Notes (Signed)
ANTICOAGULATION CONSULT NOTE  Pharmacy Consult for Heparin when INR < 2.5 Indication:  mechanical valve  Allergies  Allergen Reactions   Ace Inhibitors Cough   Lipitor [Atorvastatin Calcium] Other (See Comments)    Knee pain    Megace [Megestrol] Other (See Comments)    High blood pressure and blurred vision   Aspirin Other (See Comments)    Nephrologist said to not take this   Clorazepate Dipotassium Other (See Comments)    Interacts with a drug being taken   Simvastatin Other (See Comments)    Muscle pain   Tiotropium Bromide Monohydrate Other (See Comments)    Dry mouth   Codeine Nausea And Vomiting   Tape Rash   Patient Measurements: Height: 5\' 2"  (157.5 cm) Weight: 42.1 kg (92 lb 13 oz) IBW/kg (Calculated) : 50.1  Vital Signs:    Labs: Recent Labs    10/04/22 0259 10/04/22 0740 10/05/22 0254 10/05/22 0318 10/06/22 0321 10/06/22 1747  HGB  --   --   --  10.6* 10.9*  --   HCT  --   --   --  32.3* 33.3*  --   PLT  --   --   --  276 301  --   LABPROT  --  35.9* 27.2*  --  21.6*  --   INR  --  3.6* 2.6*  --  1.9*  --   HEPARINUNFRC  --   --   --   --   --  0.10*  CREATININE 1.31*  --  1.26*  --  1.40*  --     Estimated Creatinine Clearance: 21.7 mL/min (A) (by C-G formula based on SCr of 1.4 mg/dL (H)).  Medical History: Past Medical History:  Diagnosis Date   Allergy    seasonal   Anemia    Anxiety    Arthritis    Asthma    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 Protecta   Biventricular ICD (implantable cardioverter-defibrillator) in place    with CRT   Blood transfusion without reported diagnosis    Bursitis    Cataract    RIGHT EYE   CHF (congestive heart failure) (HCC)    Chronic kidney disease    Colon polyp    adenomatous   Complication of anesthesia    patient stated that had difficulty getting the breathing tube removed, patient said that she stopped breathing and HR dropped to 10  patient then woke up  and started breathing pateint stated no longer than one minute; re-intubated in PACU following cholecystectomy 10/28/13   COPD (chronic obstructive pulmonary disease) (HCC)    Depression    Diverticulosis    Dysrhythmia    Fibromyalgia    GERD (gastroesophageal reflux disease)    Gout    H/O mitral valve replacement 2002, 2007   Heart murmur    Hemorrhoids    Hyperlipidemia    Hypertension    Hypothyroidism    IBS (irritable bowel syndrome)    MVP (mitral valve prolapse)    NAUSEA AND VOMITING 08/01/2009   Neuromuscular disorder (HCC)    fibromyalgia   Pacemaker    Peptic ulcer disease    Medications:  Facility-Administered Medications Prior to Admission  Medication Dose Route Frequency Provider Last Rate Last Admin   0.9 %  sodium chloride infusion  500 mL Intravenous Continuous Hilarie Fredrickson, MD       Medications Prior to Admission  Medication Sig Dispense Refill Last Dose  acetaminophen (TYLENOL) 650 MG CR tablet Take 650 mg by mouth every 8 (eight) hours as needed for pain.   10/02/2022   allopurinol (ZYLOPRIM) 300 MG tablet Take 300 mg by mouth every evening.    10/02/2022   ALPRAZolam (XANAX) 0.25 MG tablet Take 0.25 mg by mouth at bedtime as needed for anxiety or sleep.    10/02/2022   antiseptic oral rinse (BIOTENE) LIQD 15 mLs by Mouth Rinse route as needed for dry mouth.   10/02/2022   buPROPion (WELLBUTRIN SR) 150 MG 12 hr tablet Take 150 mg by mouth in the morning.   10/02/2022   esomeprazole (NEXIUM) 40 MG capsule TAKE 1 CAPSULE BY MOUTH EVERY DAY BEFORE BREAKFAST (Patient taking differently: Take 40 mg by mouth daily before breakfast.) 90 capsule 0 10/02/2022   fluticasone (FLONASE) 50 MCG/ACT nasal spray Place 1 spray into both nostrils as needed for allergies.   10/02/2022   furosemide (LASIX) 20 MG tablet Take 1 tablet (20 mg total) by mouth daily. 30 tablet 0 10/02/2022   furosemide (LASIX) 40 MG tablet TAKE 1 TABLET BY MOUTH DAILY. **NEEDS OV FOR FUTURE REFILLS** (Patient  taking differently: Take 20 mg by mouth daily.) 90 tablet 2 10/02/2022   icosapent Ethyl (VASCEPA) 1 g capsule Take 1 capsule (1 g total) by mouth 2 (two) times daily. 60 capsule 11 10/02/2022   ipratropium (ATROVENT) 0.06 % nasal spray Place 2 sprays into both nostrils 2 (two) times daily.   10/02/2022   levothyroxine (SYNTHROID) 75 MCG tablet Take 75 mcg by mouth daily before breakfast.   10/02/2022   memantine (NAMENDA) 5 MG tablet Take 5 mg by mouth every morning.   10/02/2022   metoCLOPramide (REGLAN) 5 MG tablet Take 5 mg by mouth 2 (two) times daily as needed for nausea or vomiting.   10/02/2022   metoprolol succinate (TOPROL-XL) 50 MG 24 hr tablet Take 1 tablet (50 mg total) by mouth 2 (two) times daily. Take with or immediately following a meal. 180 tablet 3 10/02/2022 at 17:00   Multiple Vitamin (MULTIVITAMIN WITH MINERALS) TABS tablet Take 1 tablet by mouth daily.   10/02/2022   phenylephrine (SUDAFED PE) 10 MG TABS tablet Take 10 mg by mouth at bedtime as needed (Congestion).   10/02/2022   pravastatin (PRAVACHOL) 40 MG tablet Take 40 mg at bedtime by mouth.    10/02/2022   Sennosides (SENNA) 15 MG TABS Take 30 mg by mouth at bedtime.   10/02/2022   spironolactone (ALDACTONE) 25 MG tablet Take 1 tablet (25 mg total) by mouth daily. 90 tablet 3 10/02/2022   VOLTAREN 1 % GEL Apply 2-4 g topically in the morning and at bedtime.   10/02/2022   warfarin (COUMADIN) 2.5 MG tablet TAKE 1 TO 2 TABLETS BY MOUTH DAILY AS DIRECTED BY THE COUMADIN CLINIC (Patient taking differently: Take 2.5 mg by mouth daily. Take 1 to 2 tablets by mouth daily as directed by the coumadin clinic) 135 tablet 1 Past Week at 18:00   PROAIR HFA 108 (90 BASE) MCG/ACT inhaler Inhale 1-2 puffs into the lungs every 6 (six) hours as needed for wheezing.      Scheduled:   allopurinol  300 mg Oral QPM   buPROPion  150 mg Oral q AM   feeding supplement  237 mL Oral BID BM   furosemide  80 mg Intravenous BID   heparin  1,500 Units Intravenous Once    icosapent Ethyl  1 g Oral BID   levothyroxine  75 mcg Oral Q0600   memantine  5 mg Oral q morning   metoprolol succinate  50 mg Oral BID   ondansetron  4 mg Oral Once   pantoprazole  80 mg Oral Q1200   spironolactone  25 mg Oral Daily   Warfarin - Pharmacist Dosing Inpatient   Does not apply q1600   Infusions:   heparin 600 Units/hr (10/06/22 1006)    PRN: ALPRAZolam  Assessment: 81 yof with a history of  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 Patient is presenting with abdominal pain. Patient taking warfarin prior to arrival. Home dose is 5 mg (2.5 mg x 2) every Sun; 2.5 mg (2.5 mg x 1) all other days. INR was 8 and 7.3 on 5/1. Was instructed to hold 5/1-5/3 then resume usual dose until re-check. INR therapeutic at 2.6 today.   Pharmacy consulted to transition to heparin when INR<2.5 to allow for cardiac cath next week. INR 1.9 this AM.  5/6 PM update: Heparin level after 8 hours was 0.10, No issues with infusion or bleeding per RN. Will rebolus and increase rate to 750 units/hr. Hl in 8 hours  Goal of Therapy:  Heparin level 0.3 - 0.7 Monitor platelets by anticoagulation protocol: Yes   Plan:  Heparin 1500 units IV bolus x 1 Heparin IV infusion 750 units/hr Check heparin level in 8hrs Daily heparin level, CBC, INR Follow up plans for cardiac cath  Khamauri Bauernfeind A. Jeanella Craze, PharmD, BCPS, FNKF Clinical Pharmacist [place] Please utilize Amion for appropriate phone number to reach the unit pharmacist [facility])  10/06/2022 6:59 PM

## 2022-10-07 DIAGNOSIS — R7989 Other specified abnormal findings of blood chemistry: Secondary | ICD-10-CM | POA: Diagnosis not present

## 2022-10-07 DIAGNOSIS — Z95810 Presence of automatic (implantable) cardiac defibrillator: Secondary | ICD-10-CM | POA: Diagnosis not present

## 2022-10-07 DIAGNOSIS — I5043 Acute on chronic combined systolic (congestive) and diastolic (congestive) heart failure: Secondary | ICD-10-CM | POA: Diagnosis not present

## 2022-10-07 LAB — C-REACTIVE PROTEIN: CRP: 1.5 mg/dL — ABNORMAL HIGH (ref <1.0)

## 2022-10-07 LAB — TYPE AND SCREEN
Antibody Screen: POSITIVE
DAT, IgG: POSITIVE
Donor AG Type: NEGATIVE
Unit division: 0
Unit division: 0

## 2022-10-07 LAB — PROTIME-INR
INR: 1.5 — ABNORMAL HIGH (ref 0.8–1.2)
Prothrombin Time: 17.9 seconds — ABNORMAL HIGH (ref 11.4–15.2)

## 2022-10-07 LAB — CBC WITH DIFFERENTIAL/PLATELET
Abs Immature Granulocytes: 0.14 10*3/uL — ABNORMAL HIGH (ref 0.00–0.07)
Basophils Absolute: 0 10*3/uL (ref 0.0–0.1)
Basophils Relative: 0 %
Eosinophils Absolute: 0.3 10*3/uL (ref 0.0–0.5)
Eosinophils Relative: 3 %
HCT: 34.4 % — ABNORMAL LOW (ref 36.0–46.0)
Hemoglobin: 11.5 g/dL — ABNORMAL LOW (ref 12.0–15.0)
Immature Granulocytes: 2 %
Lymphocytes Relative: 9 %
Lymphs Abs: 0.8 10*3/uL (ref 0.7–4.0)
MCH: 30.8 pg (ref 26.0–34.0)
MCHC: 33.4 g/dL (ref 30.0–36.0)
MCV: 92.2 fL (ref 80.0–100.0)
Monocytes Absolute: 0.6 10*3/uL (ref 0.1–1.0)
Monocytes Relative: 6 %
Neutro Abs: 7.6 10*3/uL (ref 1.7–7.7)
Neutrophils Relative %: 80 %
Platelets: 274 10*3/uL (ref 150–400)
RBC: 3.73 MIL/uL — ABNORMAL LOW (ref 3.87–5.11)
RDW: 16.1 % — ABNORMAL HIGH (ref 11.5–15.5)
WBC: 9.5 10*3/uL (ref 4.0–10.5)
nRBC: 0.3 % — ABNORMAL HIGH (ref 0.0–0.2)

## 2022-10-07 LAB — COMPREHENSIVE METABOLIC PANEL
ALT: 177 U/L — ABNORMAL HIGH (ref 0–44)
AST: 80 U/L — ABNORMAL HIGH (ref 15–41)
Albumin: 2.9 g/dL — ABNORMAL LOW (ref 3.5–5.0)
Alkaline Phosphatase: 124 U/L (ref 38–126)
Anion gap: 11 (ref 5–15)
BUN: 33 mg/dL — ABNORMAL HIGH (ref 8–23)
CO2: 26 mmol/L (ref 22–32)
Calcium: 9.1 mg/dL (ref 8.9–10.3)
Chloride: 92 mmol/L — ABNORMAL LOW (ref 98–111)
Creatinine, Ser: 1.66 mg/dL — ABNORMAL HIGH (ref 0.44–1.00)
GFR, Estimated: 31 mL/min — ABNORMAL LOW (ref >60)
Glucose, Bld: 139 mg/dL — ABNORMAL HIGH (ref 70–99)
Potassium: 4 mmol/L (ref 3.5–5.1)
Sodium: 129 mmol/L — ABNORMAL LOW (ref 135–145)
Total Bilirubin: 0.7 mg/dL (ref 0.3–1.2)
Total Protein: 6.8 g/dL (ref 6.5–8.1)

## 2022-10-07 LAB — BPAM RBC

## 2022-10-07 LAB — MAGNESIUM: Magnesium: 1.7 mg/dL (ref 1.7–2.4)

## 2022-10-07 LAB — OSMOLALITY: Osmolality: 297 mOsm/kg — ABNORMAL HIGH (ref 275–295)

## 2022-10-07 LAB — CREATININE, URINE, RANDOM: Creatinine, Urine: 60 mg/dL

## 2022-10-07 LAB — HEPARIN LEVEL (UNFRACTIONATED)
Heparin Unfractionated: 0.25 IU/mL — ABNORMAL LOW (ref 0.30–0.70)
Heparin Unfractionated: 0.47 IU/mL (ref 0.30–0.70)
Heparin Unfractionated: 0.27 IU/mL — ABNORMAL LOW (ref 0.30–0.70)

## 2022-10-07 LAB — SODIUM, URINE, RANDOM: Sodium, Ur: 41 mmol/L

## 2022-10-07 LAB — BRAIN NATRIURETIC PEPTIDE: B Natriuretic Peptide: 1164.3 pg/mL — ABNORMAL HIGH (ref 0.0–100.0)

## 2022-10-07 LAB — URIC ACID: Uric Acid, Serum: 5.4 mg/dL (ref 2.5–7.1)

## 2022-10-07 LAB — OSMOLALITY, URINE: Osmolality, Ur: 281 mOsm/kg — ABNORMAL LOW (ref 300–900)

## 2022-10-07 MED ORDER — SODIUM CHLORIDE 0.9 % IV SOLN: INTRAVENOUS | Status: DC

## 2022-10-07 MED ORDER — LACTATED RINGERS IV SOLN: INTRAVENOUS | Status: DC

## 2022-10-07 MED ORDER — ASPIRIN 81 MG PO CHEW
81.0000 mg | CHEWABLE_TABLET | ORAL | Status: AC
  Administered 2022-10-08: 81 mg via ORAL
  Filled 2022-10-07: qty 1

## 2022-10-07 MED ORDER — MAGNESIUM SULFATE IN D5W 1-5 GM/100ML-% IV SOLN
1.0000 g | Freq: Once | INTRAVENOUS | Status: AC
  Administered 2022-10-07: 1 g via INTRAVENOUS
  Filled 2022-10-07: qty 100

## 2022-10-07 MED ORDER — SODIUM CHLORIDE 0.9 % IV SOLN: 250.0000 mL | INTRAVENOUS | Status: DC | PRN

## 2022-10-07 MED ORDER — SODIUM CHLORIDE 0.9% FLUSH: 3.0000 mL | INTRAVENOUS | Status: DC | PRN

## 2022-10-07 MED ORDER — SODIUM CHLORIDE 0.9% FLUSH: 3.0000 mL | Freq: Two times a day (BID) | INTRAVENOUS | Status: DC

## 2022-10-07 MED ORDER — SODIUM CHLORIDE 0.9% FLUSH
3.0000 mL | Freq: Two times a day (BID) | INTRAVENOUS | Status: DC
  Administered 2022-10-07 – 2022-10-13 (×5): 3 mL via INTRAVENOUS

## 2022-10-07 MED ORDER — LACTATED RINGERS IV SOLN: INTRAVENOUS | Status: AC

## 2022-10-07 NOTE — Progress Notes (Signed)
   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 She also reports dyspnea this week requiring up to 3 pillows when she sleeps and felt her legs have been more edematous. She takes 20 mg of Lasix daily and missed just 1 dose the night prior to admission. CXR also showing vascular congestion. Recently her LVEF had declined further down to 20 to 25% after initially improving to 45 to 50% after CRT-D therapy in 2021. BNP 3914. Echo from 4/8 showing EF 20-25%, global hypokinesis, normal RV, severely dilated left atrial size, mild AVR, and moderate TVR. Planning for [facility] tomorrow, 5/8.   Current HF Medications: Beta Blocker: metoprolol succinate 50 mg BID  Prior to admission HF Medications: Diuretic: furosemide 20 mg PO daily Beta blocker: metoprolol succinate 50 mg BID MRA: spironolactone 25 mg daily  Pertinent Lab Values: Serum creatinine 1.66, BUN 33, Potassium 4.0, Sodium 129, BNP 1164, Magnesium 1.7  Vital Signs: Weight: 94 lbs (admission weight: 92 lbs) Blood pressure: 130s/60s  Heart rate: 70s  I/O: -1.7L yesterday; net -1.6L  Medication Assistance / Insurance Benefits Check: Does the patient have prescription insurance?  Yes Type of insurance plan: Medicare  Does the patient qualify for medication assistance through manufacturers or grants?   Yes Eligible grants and/or patient assistance programs: None  Medication assistance applications in progress: None currently   Medication assistance applications approved: None Approved medication assistance renewals will be completed by: N/A  Outpatient Pharmacy:  Prior to admission outpatient [facility]: [facility]  [facility]armacy Is the patient willing to use [facility] TOC pharmacy at discharge? Yes Is the patient willing to transition their outpatient pharmacy to utilize a [facility] outpatient pharmacy? No   Assessment: 1. Acute on chronic systolic CHF (LVEF 20-25%), due to NICM. NYHA class IV symptoms. - Scr 1.26>>1.40>>1.66 from yesterday while on furosemide 80 mg IV BID. Strict I's and O's. Keep K >4 and Mg >2. Recommend giving Mg 2g IV x1  - Agree with holding furosemide and spironolactone given increase in Scr over the last 2 days and upcoming cath procedure - Continue Toprol-XL 50mg  BID - Consider adding Entresto and SGLT2i if Scr improves post-diuresis/cath    Plan: 1) Medication changes recommended at this time: - Hold furosemide and spironolactone  - Consider adding Entresto and SGLT2i if Scr improves post-diuresis and after R/LHC, 5/8 - Recommend Mg 2g IV x1 and recheck Mg in the AM   2) Patient assistance: - None  3)  Education  - To be completed prior to discharge   [name], PharmD PGY1 Pharmacy Resident 5/7/20249:38 AM

## 2022-10-07 NOTE — Progress Notes (Signed)
Rounding Note    Patient Name: [name] Date of Encounter: 10/07/2022  [facility] Cardiologist: Nanetta Batty, MD   Subjective   Feeling well.  No CP or shortness of breath.   Inpatient Medications    Scheduled Meds:  allopurinol  300 mg Oral QPM   buPROPion  150 mg Oral q AM   feeding supplement  237 mL Oral BID BM   icosapent Ethyl  1 g Oral BID   levothyroxine  75 mcg Oral Q0600   memantine  5 mg Oral q morning   metoprolol succinate  50 mg Oral BID   ondansetron  4 mg Oral Once   pantoprazole  80 mg Oral Q1200   Continuous Infusions:  heparin 850 Units/hr (10/07/22 0624)   lactated ringers 50 mL/hr at 10/07/22 0939   PRN Meds: ALPRAZolam   Vital Signs    Vitals:   10/07/22 0000 10/07/22 0028 10/07/22 0200 10/07/22 0400  BP: 132/62   139/62  Pulse: 75   73  Resp: (!) 30 18 (!) 21 16  Temp: 98.7 F (37.1 C)   98 F (36.7 C)  TempSrc: Oral   Oral  SpO2: 97%   98%  Weight:    42.9 kg  Height:        Intake/Output Summary (Last 24 hours) at 10/07/2022 0942 Last data filed at 10/07/2022 1610 Gross per 24 hour  Intake 173.11 ml  Output 1000 ml  Net -826.89 ml      10/07/2022    4:00 AM 10/06/2022    5:00 AM 10/05/2022    4:49 AM  Last 3 Weights  Weight (lbs) 94 lb 9.2 oz 92 lb 13 oz 99 lb 13.9 oz  Weight (kg) 42.9 kg 42.1 kg 45.3 kg      Telemetry    V paced rhythm with intermittent atrial pacing.  - Personally Reviewed  ECG    No new ECG tracings. - Personally Reviewed  Physical Exam   VS:  BP 139/62 (BP Location: Left Arm)   Pulse 73   Temp 98 F (36.7 C) (Oral)   Resp 16   Ht 5\' 2"  (1.575 m)   Wt 42.9 kg   SpO2 98%   BMI 17.30 kg/m  , BMI Body mass index is 17.3 kg/m. GENERAL:  Frail.  No acute distress.  HEENT: Pupils equal round and reactive, fundi not visualized, oral mucosa unremarkable NECK:  No jugular venous distention, waveform within normal limits, carotid upstroke brisk and symmetric, no bruits, no  thyromegaly LUNGS:  Clear to auscultation bilaterally HEART:  RRR.  PMI not displaced or sustained,S1 and S2 within normal limits, no S3, no S4, no clicks, no rubs, no murmurs ABD:  Flat, positive bowel sounds normal in frequency in pitch, no bruits, no rebound, no guarding, no midline pulsatile mass, no hepatomegaly, no splenomegaly EXT:  2 plus pulses throughout, no edema, no cyanosis no clubbing SKIN:  No rashes no nodules NEURO:  Cranial nerves II through XII grossly intact, motor grossly intact throughout PSYCH:  Cognitively intact, oriented to person place and time   Labs    High Sensitivity Troponin:  No results for input(s): "TROPONINIHS" in the last 720 hours.   Chemistry Recent Labs  Lab 10/05/22 0254 10/06/22 0321 10/07/22 0259  NA 133* 134* 129*  K 3.1* 5.0 4.0  CL 98 96* 92*  CO2 25 25 26   GLUCOSE 83 90 139*  BUN 27* 29* 33*  CREATININE 1.26* 1.40*  1.66*  CALCIUM 8.6* 9.2 9.1  MG 1.4* 2.3 1.7  PROT 5.9* 6.4* 6.8  ALBUMIN 2.7* 2.9* 2.9*  AST 167* 117* 80*  ALT 283* 232* 177*  ALKPHOS 135* 124 124  BILITOT 0.8 0.7 0.7  GFRNONAA 43* 38* 31*  ANIONGAP 10 13 11     Lipids No results for input(s): "CHOL", "TRIG", "HDL", "LABVLDL", "LDLCALC", "CHOLHDL" in the last 168 hours.  Hematology Recent Labs  Lab 10/05/22 0318 10/06/22 0321 10/07/22 0259  WBC 8.2 8.8 9.5  RBC 3.50* 3.51* 3.73*  HGB 10.6* 10.9* 11.5*  HCT 32.3* 33.3* 34.4*  MCV 92.3 94.9 92.2  MCH 30.3 31.1 30.8  MCHC 32.8 32.7 33.4  RDW 15.1 15.9* 16.1*  PLT 276 301 274   Thyroid No results for input(s): "TSH", "FREET4" in the last 168 hours.  BNP Recent Labs  Lab 10/05/22 0318 10/06/22 0321 10/07/22 0259  BNP 3,750.4* 2,072.8* 1,164.3*    DDimer No results for input(s): "DDIMER" in the last 168 hours.   Radiology    No results found.  Cardiac Studies   Echocardiogram 09/08/2022: Impressions:  1. Left ventricular ejection fraction, by estimation, is 20 to 25%. The  left ventricle has  severely decreased function. The left ventricle  demonstrates global hypokinesis. The left ventricular internal cavity size  was mildly dilated. Left ventricular  diastolic parameters are indeterminate.   2. Right ventricular systolic function is normal. The right ventricular  size is normal.   3. Left atrial size was severely dilated.   4. The mitral valve has been repaired/replaced. No evidence of mitral  valve regurgitation. No evidence of mitral stenosis. Echo findings are  consistent with normal structure and function of the mitral valve  prosthesis.   5. Tricuspid valve regurgitation is moderate.   6. The aortic valve is tricuspid. There is mild calcification of the  aortic valve. There is mild thickening of the aortic valve. Aortic valve  regurgitation is mild. Aortic valve sclerosis is present, with no evidence  of aortic valve stenosis. Aortic  regurgitation PHT measures 379 msec.   7. The inferior vena cava is normal in size with greater than 50%  respiratory variability, suggesting right atrial pressure of 3 mmHg.   Comparison(s): Prior images reviewed side by side.    Patient Profile     Ms. Meckley is a 13F with HFrEF, COPD, s/p CRT-D, PAF, mechanical mitral valve, HTN, HL and CKD 3 admitted with acute on chronic HFrEF and transaminitis 2/2 hepatic congestion.   Assessment & Plan    # Acute on Chronic HFrEF: # s/p ICD:  LVEF this is been as low as 10%.  It improved to 45 to 50% after CRT-D.  However repeat echo shows continued decline most recently to 20 to 25% this admission.  She has no chest pain or pressure.  She has had abdominal pain and worsening heart failure symptoms.  We were planning for left heart cath today but renal function has worsened with diuresis.  Hold on further diuresis for today.  If renal function improves plan for left and right heart cath tomorrow.  BNP continues to improve, but is still over thousand today.  Continue metoprolol.  Holding Lasix  and spironolactone.  Add SGLT2 inhibitor and ARB/Entresto as renal function and blood pressure permit after cath.  # S/p Mechanical Valve Replacement in 2007 Mechanical valve stable on echo this admission.  Coumadin on hold.  She is on IV heparin.    # Hypertension BP well controlled.  Holding spironolactone as above due to worsening renal function.  # Hyperlipidemia # Familial Hypertriglyceridemia Lipid panel in 09/2022: Total Cholesterol 273, Triglycerides 383, HDL 34, LDL 165.  - Intolerant to high-intensity statins. On Pravastatin 40mg  daily at home but currently on hold given elevated LFTs. - Continue Vascepa 2g twice daily. - She follows with Dr. Rennis Golden in [facility] and it sounds like there has been some issues with non-compliance in the past.  # Elevated LFTs Thought to be due to hepatic congestion.  Improving with diuresis.  # Acute on CKD Stage III Creatinine at 1.66 with diuresis.  Holding on further diuresis as above.   For questions or updates, please contact [facility] Please consult [email or web address] for contact info under        Signed, Chilton Si, MD  10/07/2022, 9:42 AM

## 2022-10-07 NOTE — Progress Notes (Signed)
ANTICOAGULATION CONSULT NOTE  Pharmacy Consult for Heparin when INR < 2.5 Indication:  mechanical valve  Allergies  Allergen Reactions   Ace Inhibitors Cough   Lipitor [Atorvastatin Calcium] Other (See Comments)    Knee pain    Megace [Megestrol] Other (See Comments)    High blood pressure and blurred vision   Aspirin Other (See Comments)    Nephrologist said to not take this Update 10/07/22 - patient clarifies this recommendation was lumped in with a whole bunch of other medications at that time many years ago when nephrologist told her to stay away from Tylenol, vitamins, ASA. Denies prior allergy or allergic reaction to this   Clorazepate Dipotassium Other (See Comments)    Interacts with a drug being taken   Simvastatin Other (See Comments)    Muscle pain   Tiotropium Bromide Monohydrate Other (See Comments)    Dry mouth   Codeine Nausea And Vomiting   Tape Rash   Patient Measurements: Height: 5\' 2"  (157.5 cm) Weight: 42.9 kg (94 lb 9.2 oz) IBW/kg (Calculated) : 50.1  Vital Signs: Temp: 97.8 F (36.6 C) (05/07 1200) Temp Source: Oral (05/07 1200) BP: 146/66 (05/07 1200) Pulse Rate: 75 (05/07 1200)  Labs: Recent Labs    10/05/22 0254 10/05/22 0318 10/05/22 0318 10/06/22 0321 10/06/22 1747 10/07/22 0259 10/07/22 1222  HGB  --  10.6*   < > 10.9*  --  11.5*  --   HCT  --  32.3*  --  33.3*  --  34.4*  --   PLT  --  276  --  301  --  274  --   LABPROT 27.2*  --   --  21.6*  --  17.9*  --   INR 2.6*  --   --  1.9*  --  1.5*  --   HEPARINUNFRC  --   --   --   --  0.10* 0.25* 0.47  CREATININE 1.26*  --   --  1.40*  --  1.66*  --    < > = values in this interval not displayed.   Estimated Creatinine Clearance: 18.3 mL/min (A) (by C-G formula based on SCr of 1.66 mg/dL (H)).  Medical History: Past Medical History:  Diagnosis Date   Allergy    seasonal   Anemia    Anxiety    Arthritis    Asthma    Automatic implantable cardioverter-defibrillator in situ     Medtronic Protecta   Biventricular ICD (implantable cardioverter-defibrillator) in place    with CRT   Blood transfusion without reported diagnosis    Bursitis    Cataract    RIGHT EYE   CHF (congestive heart failure) (HCC)    Chronic kidney disease    Colon polyp    adenomatous   Complication of anesthesia    patient stated that had difficulty getting the breathing tube removed, patient said that she stopped breathing and HR dropped to 10  patient then woke up and started breathing pateint stated no longer than one minute; re-intubated in PACU following cholecystectomy 10/28/13   COPD (chronic obstructive pulmonary disease) (HCC)    Depression    Diverticulosis    Dysrhythmia    Fibromyalgia    GERD (gastroesophageal reflux disease)    Gout    H/O mitral valve replacement 2002, 2007   Heart murmur    Hemorrhoids    Hyperlipidemia    Hypertension    Hypothyroidism    IBS (irritable bowel syndrome)  MVP (mitral valve prolapse)    NAUSEA AND VOMITING 08/01/2009   Neuromuscular disorder (HCC)    fibromyalgia   Pacemaker    Peptic ulcer disease    Assessment: 33 yof with a history of  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 Patient taking warfarin prior to arrival. Home dose is 5 mg (2.5 mg x 2) every Sun; 2.5 mg (2.5 mg x 1) all other days. Patient is presenting with abdominal pain. Pharmacy consulted to transition to heparin when INR < 2.5 to allow for cardiac cath next week. INR now < 2.5.  Heparin level therapeutic 0.47 on 850 units/hr. No issues with heparin infusion or signs of bleeding reported.  Goal of Therapy:  Heparin level 0.3 - 0.7 Monitor platelets by anticoagulation protocol: Yes   Plan:  Continue heparin infusion at 850 units/hr Check heparin level in 8 hours and daily while on heparin Continue to monitor H&H and platelets  Thank you for allowing  pharmacy to be a part of this patient's care.  Thelma Barge, PharmD Clinical Pharmacist

## 2022-10-07 NOTE — Progress Notes (Signed)
Mobility Specialist - Progress Note   10/07/22 1411  Mobility  Activity Ambulated with assistance to bathroom  Level of Assistance Standby assist, set-up cues, supervision of patient - no hands on  Assistive Device Four wheel walker  Distance Ambulated (ft) 20 ft  Activity Response Tolerated well  Mobility Referral Yes  $Mobility charge 1 Mobility   Pt was received EOB and needing assistance to BR. No complaints throughout. Pt with successful void. Pt was returned to EOB with all needs met.   Alda Lea  Mobility Specialist Please contact via Special educational needs teacher or Rehab office at [phone]

## 2022-10-07 NOTE — Progress Notes (Signed)
PROGRESS NOTE        PATIENT DETAILS Name: [name] Age: 82 y.o. Sex: female Date of Birth: Sep 02, 1940 Admit Date: 10/03/2022 Admitting Physician Anselm Jungling, DO ZOX:[id number], Barry Dienes, MD  Brief Summary: Patient is a 82 y.o.  female with history of HFrEF, mitral valve replacement-mechanical valve on Coumadin, COPD, HTN, PAF-who presented with abdominal pain with worsening orthopnea and exertional dyspnea x 7 days--found to have elevated transaminases-with concern for either congestive hepatitis or Tylenol toxicity-started on Mucomyst infusion after discussion with poison control and admitted to the hospitalist service.   Significant events: 5/3>> admit to [facility]  Significant studies: 5/3>> RUQ ultrasound: S/p cholecystectomy-nodular hepatic contour suspicious for cirrhosis 5/3>> CXR: Bilateral airspace disease-likely pulm edema 5/3>> x-ray abdomen: No bowel obstruction 5/3>> acute hepatitis serology: Negative 5/3>> lipase: Negative 5/3>> Tylenol level:<10  Significant microbiology data: None  Procedures: None  Consults: Cardiology  Subjective: Patient in bed, appears comfortable, denies any headache, no fever, no chest pain or pressure, no shortness of breath , no abdominal pain. No new focal weakness.   Objective: Vitals: Blood pressure 139/62, pulse 73, temperature 98 F (36.7 C), temperature source Oral, resp. rate 16, height 5\' 2"  (1.575 m), weight 42.9 kg, SpO2 98 %.   Exam:  Awake Alert, No new F.N deficits, Normal affect [place].AT,PERRAL Supple Neck, No JVD,   Symmetrical Chest wall movement, Good air movement bilaterally, CTAB RRR,No Gallops, Rubs or new Murmurs,  +ve B.Sounds, Abd Soft, No tenderness,   No Cyanosis, Clubbing or edema    Assessment/Plan:  Elevated transaminases  -  etiology-likely from CHF and congestive otitis, acute hepatitis serology negative, she has had cholecystectomy in the past, seen by GI remotely, per  [facility] nothing else to offer except diuresis as being done by cardiology.  LFTs are downtrending.  There was suspicion for possible mild Tylenol toxicity for which she has finished Mucomyst infusion.  Continue trending LFTs and INR.   Acute on chronic combined systolic and diastolic heart failure EF around 20 to 25%, has AICD.  No peripheral signs of volume overload-but CXR with pulm edema-significantly elevated BNP levels.  Cardiology on board and directing diuretic dose.  Much improved clinically.  Cards is contemplating cardiac catheterization this admission likely 10/08/2022 if renal function improves.  PAF with [place] vas 2 score of greater than 4 Beta-blocker Coumadin for pharmacy-INR therapeutic, will be transition to heparin once INR reaches below 2.5 as instructed by cardiology as they are contemplating cardiac catheterization this admission.  Mechanical mitral valve replacement 2002 Goal INR 2.5-3.5 Coumadin per pharmacy  Hypokalemia and hypomagnesemia.  Replaced.    AKI on CKD stage IIIb Old further diuretics on 10/07/2022, gentle IV fluids for total of 300 cc per cardiology, monitor,  COPD Not in exacerbation Bronchodilators  HTN BP stable Beta-blocker holding diuretics as she has developed some AKI  Hypothyroidism Levothyroxine  Mood disorder Appears stable Wellbutrin/Xanax  Underweight: Estimated body mass index is 17.3 kg/m as calculated from the following:   Height as of this encounter: 5\' 2"  (1.575 m).   Weight as of this encounter: 42.9 kg.   Code status:   Code Status: Prior   DVT Prophylaxis:    Family Communication: None at bedside   Disposition Plan:  Status is: inpt The patient will require care spanning > 2 midnights and should be moved  to inpatient because: Severity of illness   Planned Discharge Destination:Home   Diet: Diet Order             Diet Heart Room service appropriate? Yes; Fluid consistency: Thin  Diet effective now                      Antimicrobial agents: Anti-infectives (From admission, onward)    None        MEDICATIONS: Scheduled Meds:  allopurinol  300 mg Oral QPM   buPROPion  150 mg Oral q AM   feeding supplement  237 mL Oral BID BM   icosapent Ethyl  1 g Oral BID   levothyroxine  75 mcg Oral Q0600   memantine  5 mg Oral q morning   metoprolol succinate  50 mg Oral BID   ondansetron  4 mg Oral Once   pantoprazole  80 mg Oral Q1200   Continuous Infusions:  heparin 850 Units/hr (10/07/22 0624)   lactated ringers     PRN Meds:.ALPRAZolam   I have personally reviewed following labs and imaging studies  LABORATORY DATA:  Recent Labs  Lab 10/03/22 1332 10/05/22 0318 10/06/22 0321 10/07/22 0259  WBC 6.5 8.2 8.8 9.5  HGB 9.9* 10.6* 10.9* 11.5*  HCT 30.8* 32.3* 33.3* 34.4*  PLT 271 276 301 274  MCV 95.1 92.3 94.9 92.2  MCH 30.6 30.3 31.1 30.8  MCHC 32.1 32.8 32.7 33.4  RDW 14.9 15.1 15.9* 16.1*  LYMPHSABS  --  0.9 1.0 0.8  MONOABS  --  0.7 0.8 0.6  EOSABS  --  0.4 0.4 0.3  BASOSABS  --  0.0 0.0 0.0    Recent Labs  Lab 10/03/22 1332 10/04/22 0259 10/04/22 0740 10/05/22 0254 10/05/22 0318 10/06/22 0321 10/07/22 0259  NA 132* 131*  --  133*  --  134* 129*  K 3.9 4.1  --  3.1*  --  5.0 4.0  CL 95* 96*  --  98  --  96* 92*  CO2 24 22  --  25  --  25 26  ANIONGAP 13 13  --  10  --  13 11  GLUCOSE 91 148*  --  83  --  90 139*  BUN 24* 25*  --  27*  --  29* 33*  CREATININE 1.41* 1.31*  --  1.26*  --  1.40* 1.66*  AST 356* 262*  --  167*  --  117* 80*  ALT 343* 333*  --  283*  --  232* 177*  ALKPHOS 142* 136*  --  135*  --  124 124  BILITOT 0.9 1.6*  --  0.8  --  0.7 0.7  ALBUMIN 3.1* 2.6*  --  2.7*  --  2.9* 2.9*  CRP  --   --   --  2.7*  --  1.7* 1.5*  INR 3.6*  --  3.6* 2.6*  --  1.9* 1.5*  BNP  --  3,914.7*  --   --  3,750.4* 2,072.8* 1,164.3*  MG  --   --   --  1.4*  --  2.3 1.7  CALCIUM 8.7* 8.6*  --  8.6*  --  9.2 9.1     Recent Labs  Lab  10/03/22 1332 10/04/22 0259 10/04/22 0740 10/05/22 0254 10/05/22 0318 10/06/22 0321 10/07/22 0259  CRP  --   --   --  2.7*  --  1.7* 1.5*  INR 3.6*  --  3.6* 2.6*  --  1.9* 1.5*  BNP  --  3,914.7*  --   --  3,750.4* 2,072.8* 1,164.3*  MG  --   --   --  1.4*  --  2.3 1.7  CALCIUM 8.7* 8.6*  --  8.6*  --  9.2 9.1    RADIOLOGY STUDIES/RESULTS: No results found.   LOS: 3 days   Signature  -    Susa Raring M.D on 10/07/2022 at 9:02 AM   -  To page go to [email or web address]

## 2022-10-07 NOTE — H&P (View-Only) (Signed)
 Rounding Note    Patient Name: [name] Date of Encounter: 10/07/2022  [facility] Cardiologist: Jonathan Berry, MD   Subjective   Feeling well.  No CP or shortness of breath.   Inpatient Medications    Scheduled Meds:  allopurinol  300 mg Oral QPM   buPROPion  150 mg Oral q AM   feeding supplement  237 mL Oral BID BM   icosapent Ethyl  1 g Oral BID   levothyroxine  75 mcg Oral Q0600   memantine  5 mg Oral q morning   metoprolol succinate  50 mg Oral BID   ondansetron  4 mg Oral Once   pantoprazole  80 mg Oral Q1200   Continuous Infusions:  heparin 850 Units/hr (10/07/22 0624)   lactated ringers 50 mL/hr at 10/07/22 0939   PRN Meds: ALPRAZolam   Vital Signs    Vitals:   10/07/22 0000 10/07/22 0028 10/07/22 0200 10/07/22 0400  BP: 132/62   139/62  Pulse: 75   73  Resp: (!) 30 18 (!) 21 16  Temp: 98.7 F (37.1 C)   98 F (36.7 C)  TempSrc: Oral   Oral  SpO2: 97%   98%  Weight:    42.9 kg  Height:        Intake/Output Summary (Last 24 hours) at 10/07/2022 0942 Last data filed at 10/07/2022 0624 Gross per 24 hour  Intake 173.11 ml  Output 1000 ml  Net -826.89 ml      10/07/2022    4:00 AM 10/06/2022    5:00 AM 10/05/2022    4:49 AM  Last 3 Weights  Weight (lbs) 94 lb 9.2 oz 92 lb 13 oz 99 lb 13.9 oz  Weight (kg) 42.9 kg 42.1 kg 45.3 kg      Telemetry    V paced rhythm with intermittent atrial pacing.  - Personally Reviewed  ECG    No new ECG tracings. - Personally Reviewed  Physical Exam   VS:  BP 139/62 (BP Location: Left Arm)   Pulse 73   Temp 98 F (36.7 C) (Oral)   Resp 16   Ht 5' 2" (1.575 m)   Wt 42.9 kg   SpO2 98%   BMI 17.30 kg/m  , BMI Body mass index is 17.3 kg/m. GENERAL:  Frail.  No acute distress.  HEENT: Pupils equal round and reactive, fundi not visualized, oral mucosa unremarkable NECK:  No jugular venous distention, waveform within normal limits, carotid upstroke brisk and symmetric, no bruits, no  thyromegaly LUNGS:  Clear to auscultation bilaterally HEART:  RRR.  PMI not displaced or sustained,S1 and S2 within normal limits, no S3, no S4, no clicks, no rubs, no murmurs ABD:  Flat, positive bowel sounds normal in frequency in pitch, no bruits, no rebound, no guarding, no midline pulsatile mass, no hepatomegaly, no splenomegaly EXT:  2 plus pulses throughout, no edema, no cyanosis no clubbing SKIN:  No rashes no nodules NEURO:  Cranial nerves II through XII grossly intact, motor grossly intact throughout PSYCH:  Cognitively intact, oriented to person place and time   Labs    High Sensitivity Troponin:  No results for input(s): "TROPONINIHS" in the last 720 hours.   Chemistry Recent Labs  Lab 10/05/22 0254 10/06/22 0321 10/07/22 0259  NA 133* 134* 129*  K 3.1* 5.0 4.0  CL 98 96* 92*  CO2 25 25 26  GLUCOSE 83 90 139*  BUN 27* 29* 33*  CREATININE 1.26* 1.40*   1.66*  CALCIUM 8.6* 9.2 9.1  MG 1.4* 2.3 1.7  PROT 5.9* 6.4* 6.8  ALBUMIN 2.7* 2.9* 2.9*  AST 167* 117* 80*  ALT 283* 232* 177*  ALKPHOS 135* 124 124  BILITOT 0.8 0.7 0.7  GFRNONAA 43* 38* 31*  ANIONGAP 10 13 11    Lipids No results for input(s): "CHOL", "TRIG", "HDL", "LABVLDL", "LDLCALC", "CHOLHDL" in the last 168 hours.  Hematology Recent Labs  Lab 10/05/22 0318 10/06/22 0321 10/07/22 0259  WBC 8.2 8.8 9.5  RBC 3.50* 3.51* 3.73*  HGB 10.6* 10.9* 11.5*  HCT 32.3* 33.3* 34.4*  MCV 92.3 94.9 92.2  MCH 30.3 31.1 30.8  MCHC 32.8 32.7 33.4  RDW 15.1 15.9* 16.1*  PLT 276 301 274   Thyroid No results for input(s): "TSH", "FREET4" in the last 168 hours.  BNP Recent Labs  Lab 10/05/22 0318 10/06/22 0321 10/07/22 0259  BNP 3,750.4* 2,072.8* 1,164.3*    DDimer No results for input(s): "DDIMER" in the last 168 hours.   Radiology    No results found.  Cardiac Studies   Echocardiogram 09/08/2022: Impressions:  1. Left ventricular ejection fraction, by estimation, is 20 to 25%. The  left ventricle has  severely decreased function. The left ventricle  demonstrates global hypokinesis. The left ventricular internal cavity size  was mildly dilated. Left ventricular  diastolic parameters are indeterminate.   2. Right ventricular systolic function is normal. The right ventricular  size is normal.   3. Left atrial size was severely dilated.   4. The mitral valve has been repaired/replaced. No evidence of mitral  valve regurgitation. No evidence of mitral stenosis. Echo findings are  consistent with normal structure and function of the mitral valve  prosthesis.   5. Tricuspid valve regurgitation is moderate.   6. The aortic valve is tricuspid. There is mild calcification of the  aortic valve. There is mild thickening of the aortic valve. Aortic valve  regurgitation is mild. Aortic valve sclerosis is present, with no evidence  of aortic valve stenosis. Aortic  regurgitation PHT measures 379 msec.   7. The inferior vena cava is normal in size with greater than 50%  respiratory variability, suggesting right atrial pressure of 3 mmHg.   Comparison(s): Prior images reviewed side by side.    Patient Profile     Ms. Orsak is a 81F with HFrEF, COPD, s/p CRT-D, PAF, mechanical mitral valve, HTN, HL and CKD 3 admitted with acute on chronic HFrEF and transaminitis 2/2 hepatic congestion.   Assessment & Plan    # Acute on Chronic HFrEF: # s/p ICD:  LVEF this is been as low as 10%.  It improved to 45 to 50% after CRT-D.  However repeat echo shows continued decline most recently to 20 to 25% this admission.  She has no chest pain or pressure.  She has had abdominal pain and worsening heart failure symptoms.  We were planning for left heart cath today but renal function has worsened with diuresis.  Hold on further diuresis for today.  If renal function improves plan for left and right heart cath tomorrow.  BNP continues to improve, but is still over thousand today.  Continue metoprolol.  Holding Lasix  and spironolactone.  Add SGLT2 inhibitor and ARB/Entresto as renal function and blood pressure permit after cath.  # S/p Mechanical Valve Replacement in 2007 Mechanical valve stable on echo this admission.  Coumadin on hold.  She is on IV heparin.    # Hypertension BP well controlled.    Holding spironolactone as above due to worsening renal function.  # Hyperlipidemia # Familial Hypertriglyceridemia Lipid panel in 09/2022: Total Cholesterol 273, Triglycerides 383, HDL 34, LDL 165.  - Intolerant to high-intensity statins. On Pravastatin 40mg daily at home but currently on hold given elevated LFTs. - Continue Vascepa 2g twice daily. - She follows with Dr. Hilty [facility] and it sounds like there has been some issues with non-compliance in the past.  # Elevated LFTs Thought to be due to hepatic congestion.  Improving with diuresis.  # Acute on CKD Stage III Creatinine at 1.66 with diuresis.  Holding on further diuresis as above.   For questions or updates, please contact [facility] Please consult [email or web address] for contact info under        Signed, Gladys Gutman [place], MD  10/07/2022, 9:42 AM       

## 2022-10-07 NOTE — Progress Notes (Signed)
ANTICOAGULATION CONSULT NOTE  Pharmacy Consult for Heparin when INR < 2.5 Indication:  mechanical valve  Allergies  Allergen Reactions   Ace Inhibitors Cough   Lipitor [Atorvastatin Calcium] Other (See Comments)    Knee pain    Megace [Megestrol] Other (See Comments)    High blood pressure and blurred vision   Aspirin Other (See Comments)    Nephrologist said to not take this   Clorazepate Dipotassium Other (See Comments)    Interacts with a drug being taken   Simvastatin Other (See Comments)    Muscle pain   Tiotropium Bromide Monohydrate Other (See Comments)    Dry mouth   Codeine Nausea And Vomiting   Tape Rash   Patient Measurements: Height: 5\' 2"  (157.5 cm) Weight: 42.1 kg (92 lb 13 oz) IBW/kg (Calculated) : 50.1  Vital Signs: Temp: 98.7 F (37.1 C) (05/07 0000) Temp Source: Oral (05/07 0000) BP: 132/62 (05/07 0000) Pulse Rate: 75 (05/07 0000)  Labs: Recent Labs    10/05/22 0254 10/05/22 0318 10/05/22 0318 10/06/22 0321 10/06/22 1747 10/07/22 0259  HGB  --  10.6*   < > 10.9*  --  11.5*  HCT  --  32.3*  --  33.3*  --  34.4*  PLT  --  276  --  301  --  274  LABPROT 27.2*  --   --  21.6*  --  17.9*  INR 2.6*  --   --  1.9*  --  1.5*  HEPARINUNFRC  --   --   --   --  0.10* 0.25*  CREATININE 1.26*  --   --  1.40*  --  1.66*   < > = values in this interval not displayed.    Estimated Creatinine Clearance: 18 mL/min (A) (by C-G formula based on SCr of 1.66 mg/dL (H)).  Medical History: Past Medical History:  Diagnosis Date   Allergy    seasonal   Anemia    Anxiety    Arthritis    Asthma    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 Protecta   Biventricular ICD (implantable cardioverter-defibrillator) in place    with CRT   Blood transfusion without reported diagnosis    Bursitis    Cataract    RIGHT EYE   CHF (congestive heart failure) (HCC)    Chronic kidney disease    Colon polyp    adenomatous   Complication of  anesthesia    patient stated that had difficulty getting the breathing tube removed, patient said that she stopped breathing and HR dropped to 10  patient then woke up and started breathing pateint stated no longer than one minute; re-intubated in PACU following cholecystectomy 10/28/13   COPD (chronic obstructive pulmonary disease) (HCC)    Depression    Diverticulosis    Dysrhythmia    Fibromyalgia    GERD (gastroesophageal reflux disease)    Gout    H/O mitral valve replacement 2002, 2007   Heart murmur    Hemorrhoids    Hyperlipidemia    Hypertension    Hypothyroidism    IBS (irritable bowel syndrome)    MVP (mitral valve prolapse)    NAUSEA AND VOMITING 08/01/2009   Neuromuscular disorder (HCC)    fibromyalgia   Pacemaker    Peptic ulcer disease    Medications:  Facility-Administered Medications Prior to Admission  Medication Dose Route Frequency Provider Last Rate Last Admin   0.9 %  sodium chloride infusion  500 mL Intravenous  Continuous Hilarie Fredrickson, MD       Medications Prior to Admission  Medication Sig Dispense Refill Last Dose   acetaminophen (TYLENOL) 650 MG CR tablet Take 650 mg by mouth every 8 (eight) hours as needed for pain.   10/02/2022   allopurinol (ZYLOPRIM) 300 MG tablet Take 300 mg by mouth every evening.    10/02/2022   ALPRAZolam (XANAX) 0.25 MG tablet Take 0.25 mg by mouth at bedtime as needed for anxiety or sleep.    10/02/2022   antiseptic oral rinse (BIOTENE) LIQD 15 mLs by Mouth Rinse route as needed for dry mouth.   10/02/2022   buPROPion (WELLBUTRIN SR) 150 MG 12 hr tablet Take 150 mg by mouth in the morning.   10/02/2022   esomeprazole (NEXIUM) 40 MG capsule TAKE 1 CAPSULE BY MOUTH EVERY DAY BEFORE BREAKFAST (Patient taking differently: Take 40 mg by mouth daily before breakfast.) 90 capsule 0 10/02/2022   fluticasone (FLONASE) 50 MCG/ACT nasal spray Place 1 spray into both nostrils as needed for allergies.   10/02/2022   furosemide (LASIX) 20 MG tablet Take 1  tablet (20 mg total) by mouth daily. 30 tablet 0 10/02/2022   furosemide (LASIX) 40 MG tablet TAKE 1 TABLET BY MOUTH DAILY. **NEEDS OV FOR FUTURE REFILLS** (Patient taking differently: Take 20 mg by mouth daily.) 90 tablet 2 10/02/2022   icosapent Ethyl (VASCEPA) 1 g capsule Take 1 capsule (1 g total) by mouth 2 (two) times daily. 60 capsule 11 10/02/2022   ipratropium (ATROVENT) 0.06 % nasal spray Place 2 sprays into both nostrils 2 (two) times daily.   10/02/2022   levothyroxine (SYNTHROID) 75 MCG tablet Take 75 mcg by mouth daily before breakfast.   10/02/2022   memantine (NAMENDA) 5 MG tablet Take 5 mg by mouth every morning.   10/02/2022   metoCLOPramide (REGLAN) 5 MG tablet Take 5 mg by mouth 2 (two) times daily as needed for nausea or vomiting.   10/02/2022   metoprolol succinate (TOPROL-XL) 50 MG 24 hr tablet Take 1 tablet (50 mg total) by mouth 2 (two) times daily. Take with or immediately following a meal. 180 tablet 3 10/02/2022 at 17:00   Multiple Vitamin (MULTIVITAMIN WITH MINERALS) TABS tablet Take 1 tablet by mouth daily.   10/02/2022   phenylephrine (SUDAFED PE) 10 MG TABS tablet Take 10 mg by mouth at bedtime as needed (Congestion).   10/02/2022   pravastatin (PRAVACHOL) 40 MG tablet Take 40 mg at bedtime by mouth.    10/02/2022   Sennosides (SENNA) 15 MG TABS Take 30 mg by mouth at bedtime.   10/02/2022   spironolactone (ALDACTONE) 25 MG tablet Take 1 tablet (25 mg total) by mouth daily. 90 tablet 3 10/02/2022   VOLTAREN 1 % GEL Apply 2-4 g topically in the morning and at bedtime.   10/02/2022   warfarin (COUMADIN) 2.5 MG tablet TAKE 1 TO 2 TABLETS BY MOUTH DAILY AS DIRECTED BY THE COUMADIN CLINIC (Patient taking differently: Take 2.5 mg by mouth daily. Take 1 to 2 tablets by mouth daily as directed by the coumadin clinic) 135 tablet 1 Past Week at 18:00   PROAIR HFA 108 (90 BASE) MCG/ACT inhaler Inhale 1-2 puffs into the lungs every 6 (six) hours as needed for wheezing.      Scheduled:   allopurinol  300 mg  Oral QPM   buPROPion  150 mg Oral q AM   feeding supplement  237 mL Oral BID BM   furosemide  80 mg Intravenous BID   icosapent Ethyl  1 g Oral BID   levothyroxine  75 mcg Oral Q0600   memantine  5 mg Oral q morning   metoprolol succinate  50 mg Oral BID   ondansetron  4 mg Oral Once   pantoprazole  80 mg Oral Q1200   spironolactone  25 mg Oral Daily   Warfarin - Pharmacist Dosing Inpatient   Does not apply q1600   Infusions:   heparin 750 Units/hr (10/06/22 1941)    PRN: ALPRAZolam  Assessment: 74 yof with a history of  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 Patient is presenting with abdominal pain. Patient taking warfarin prior to arrival. Home dose is 5 mg (2.5 mg x 2) every Sun; 2.5 mg (2.5 mg x 1) all other days. INR was 8 and 7.3 on 5/1. Was instructed to hold 5/1-5/3 then resume usual dose until re-check. INR therapeutic at 2.6 today.   Pharmacy consulted to transition to heparin when INR<2.5 to allow for cardiac cath next week. INR 1.9 this AM.  5/6 PM update: Heparin level after 8 hours was 0.10, No issues with infusion or bleeding per RN. Will rebolus and increase rate to 750 units/hr. Hl in 8 hours  5/7 AM update:  Heparin level sub-therapeutic  Goal of Therapy:  Heparin level 0.3 - 0.7 Monitor platelets by anticoagulation protocol: Yes   Plan:  Inc heparin to 850 units/hr Check heparin level in 8hrs Daily heparin level, CBC, INR Follow up plans for cardiac cath  Abran Duke, PharmD, BCPS Clinical Pharmacist Phone: [phone]

## 2022-10-07 NOTE — Progress Notes (Signed)
Mobility Specialist - Progress Note   10/07/22 1043  Mobility  Activity Ambulated with assistance in room  Level of Assistance Standby assist, set-up cues, supervision of patient - no hands on  Assistive Device Four wheel walker  Distance Ambulated (ft) 10 ft  Activity Response Tolerated well  Mobility Referral Yes  $Mobility charge 1 Mobility   Pt was received in BR needing assistance back to bed. No complaints throughout session. Pt was returned to EOB with all needs met. RN present.   Alda Lea  Mobility Specialist Please contact via Special educational needs teacher or Rehab office at [phone]

## 2022-10-07 NOTE — Progress Notes (Signed)
ANTICOAGULATION CONSULT NOTE - Follow Up Consult  Pharmacy Consult for Heparin (warfarin on hold) Indication:  mechanical valve  Allergies  Allergen Reactions   Ace Inhibitors Cough   Lipitor [Atorvastatin Calcium] Other (See Comments)    Knee pain    Megace [Megestrol] Other (See Comments)    High blood pressure and blurred vision   Aspirin Other (See Comments)    Nephrologist said to not take this Update 10/07/22 - patient clarifies this recommendation was lumped in with a whole bunch of other medications at that time many years ago when nephrologist told her to stay away from Tylenol, vitamins, ASA. Denies prior allergy or allergic reaction to this   Clorazepate Dipotassium Other (See Comments)    Interacts with a drug being taken   Simvastatin Other (See Comments)    Muscle pain   Tiotropium Bromide Monohydrate Other (See Comments)    Dry mouth   Codeine Nausea And Vomiting   Tape Rash    Patient Measurements: Height: 5\' 2"  (157.5 cm) Weight: 42.9 kg (94 lb 9.2 oz) IBW/kg (Calculated) : 50.1 Heparin Dosing Weight: 42.9 kg  Vital Signs: Temp: 98 F (36.7 C) (05/07 1949) Temp Source: Oral (05/07 1949) BP: 125/69 (05/07 1949) Pulse Rate: 75 (05/07 1949)  Labs: Recent Labs    10/05/22 0254 10/05/22 0318 10/05/22 0318 10/06/22 0321 10/06/22 1747 10/07/22 0259 10/07/22 1222 10/07/22 1826  HGB  --  10.6*   < > 10.9*  --  11.5*  --   --   HCT  --  32.3*  --  33.3*  --  34.4*  --   --   PLT  --  276  --  301  --  274  --   --   LABPROT 27.2*  --   --  21.6*  --  17.9*  --   --   INR 2.6*  --   --  1.9*  --  1.5*  --   --   HEPARINUNFRC  --   --   --   --    < > 0.25* 0.47 0.27*  CREATININE 1.26*  --   --  1.40*  --  1.66*  --   --    < > = values in this interval not displayed.    Estimated Creatinine Clearance: 18.3 mL/min (A) (by C-G formula based on SCr of 1.66 mg/dL (H)).  Assessment: 26 yof with a history of  mechanical mitral valve on warfarin,  biventricular PPM/ICD, chronic diastolic heart failure, paroxysmal atrial fibrillation, COPD,HTN, HLD, CKD. Patient taking warfarin prior to arrival. Home dose is 5 mg (2.5 mg x 2) every Sun; 2.5 mg (2.5 mg x 1) all other days. Patient is presenting with abdominal pain. Pharmacy consulted to transition to heparin when INR < 2.5 to allow for cardiac cath this week. Heparin begun on 10/06/22 when INR down to 1.9.   Heparin level was therapeutic (0.47) earlier today on 850 units/hr, but pm level is just below therapeutic range (0.27).  RN reports no infusion issues. INR down to 1.5 today.   Goal of Therapy:  Heparin level 0.3-0.7 units/ml Monitor platelets by anticoagulation protocol: Yes   Plan:  Increase heparin drip to 950 units/hr Next heparin level, PT/INR and CBC in am. Warfarin on hold for possible cardiac cath.  Dennie Fetters, RPh 10/07/2022,9:17 PM

## 2022-10-07 NOTE — Progress Notes (Signed)
Pre-cath orders re-written. Patient has ASA on her contraindications list under "Nephrologist said to not take this". Per discussion with patient she clarifies this recommendation was lumped in with a whole bunch of other medications at that time many years ago when nephrologist told her to stay away from Tylenol, vitamins, ASA. Denies prior allergy or allergic reaction to this.

## 2022-10-07 NOTE — Care Management Important Message (Signed)
Important Message  Patient Details  Name: [name] MRN: [id number] Date of Birth: 1940-08-31   Medicare Important Message Given:  Yes     Dorena Bodo 10/07/2022, 1:02 PM

## 2022-10-08 ENCOUNTER — Encounter (HOSPITAL_COMMUNITY)
Admission: EM | Disposition: A | Discharge status: Home / Self Care | Payer: Self-pay | Source: Home / Self Care | Attending: Internal Medicine

## 2022-10-08 ENCOUNTER — Ambulatory Visit: Payer: Medicare Other

## 2022-10-08 ENCOUNTER — Encounter (HOSPITAL_COMMUNITY): Payer: Self-pay | Admitting: Cardiovascular Disease

## 2022-10-08 DIAGNOSIS — I5043 Acute on chronic combined systolic (congestive) and diastolic (congestive) heart failure: Secondary | ICD-10-CM | POA: Diagnosis not present

## 2022-10-08 DIAGNOSIS — Z95810 Presence of automatic (implantable) cardiac defibrillator: Secondary | ICD-10-CM | POA: Diagnosis not present

## 2022-10-08 DIAGNOSIS — R7989 Other specified abnormal findings of blood chemistry: Secondary | ICD-10-CM | POA: Diagnosis not present

## 2022-10-08 DIAGNOSIS — R7401 Elevation of levels of liver transaminase levels: Secondary | ICD-10-CM | POA: Diagnosis not present

## 2022-10-08 DIAGNOSIS — I2510 Atherosclerotic heart disease of native coronary artery without angina pectoris: Secondary | ICD-10-CM | POA: Diagnosis not present

## 2022-10-08 HISTORY — PX: RIGHT/LEFT HEART CATH AND CORONARY ANGIOGRAPHY: CATH118266

## 2022-10-08 LAB — POCT I-STAT EG7
Acid-Base Excess: 5 mmol/L — ABNORMAL HIGH (ref 0.0–2.0)
Acid-Base Excess: 5 mmol/L — ABNORMAL HIGH (ref 0.0–2.0)
Bicarbonate: 30.3 mmol/L — ABNORMAL HIGH (ref 20.0–28.0)
Bicarbonate: 30.8 mmol/L — ABNORMAL HIGH (ref 20.0–28.0)
Calcium, Ion: 1.2 mmol/L (ref 1.15–1.40)
Calcium, Ion: 1.22 mmol/L (ref 1.15–1.40)
HCT: 32 % — ABNORMAL LOW (ref 36.0–46.0)
HCT: 32 % — ABNORMAL LOW (ref 36.0–46.0)
Hemoglobin: 10.9 g/dL — ABNORMAL LOW (ref 12.0–15.0)
Hemoglobin: 10.9 g/dL — ABNORMAL LOW (ref 12.0–15.0)
O2 Saturation: 56 %
O2 Saturation: 57 %
Potassium: 4.5 mmol/L (ref 3.5–5.1)
Potassium: 4.6 mmol/L (ref 3.5–5.1)
Sodium: 134 mmol/L — ABNORMAL LOW (ref 135–145)
Sodium: 135 mmol/L (ref 135–145)
TCO2: 32 mmol/L (ref 22–32)
TCO2: 32 mmol/L (ref 22–32)
pCO2, Ven: 46.5 mmHg (ref 44–60)
pCO2, Ven: 47.6 mmHg (ref 44–60)
pH, Ven: 7.419 (ref 7.25–7.43)
pH, Ven: 7.422 (ref 7.25–7.43)
pO2, Ven: 29 mmHg — CL (ref 32–45)
pO2, Ven: 29 mmHg — CL (ref 32–45)

## 2022-10-08 LAB — CBC WITH DIFFERENTIAL/PLATELET
Abs Immature Granulocytes: 0.12 10*3/uL — ABNORMAL HIGH (ref 0.00–0.07)
Basophils Absolute: 0 10*3/uL (ref 0.0–0.1)
Basophils Relative: 1 %
Eosinophils Absolute: 0.3 10*3/uL (ref 0.0–0.5)
Eosinophils Relative: 4 %
HCT: 33.5 % — ABNORMAL LOW (ref 36.0–46.0)
Hemoglobin: 10.8 g/dL — ABNORMAL LOW (ref 12.0–15.0)
Immature Granulocytes: 2 %
Lymphocytes Relative: 13 %
Lymphs Abs: 0.9 10*3/uL (ref 0.7–4.0)
MCH: 30.7 pg (ref 26.0–34.0)
MCHC: 32.2 g/dL (ref 30.0–36.0)
MCV: 95.2 fL (ref 80.0–100.0)
Monocytes Absolute: 0.7 10*3/uL (ref 0.1–1.0)
Monocytes Relative: 9 %
Neutro Abs: 5.2 10*3/uL (ref 1.7–7.7)
Neutrophils Relative %: 71 %
Platelets: 248 10*3/uL (ref 150–400)
RBC: 3.52 MIL/uL — ABNORMAL LOW (ref 3.87–5.11)
RDW: 16.5 % — ABNORMAL HIGH (ref 11.5–15.5)
WBC: 7.2 10*3/uL (ref 4.0–10.5)
nRBC: 0 % (ref 0.0–0.2)

## 2022-10-08 LAB — POCT I-STAT 7, (LYTES, BLD GAS, ICA,H+H)
Acid-Base Excess: 2 mmol/L (ref 0.0–2.0)
Bicarbonate: 27.3 mmol/L (ref 20.0–28.0)
Calcium, Ion: 1.2 mmol/L (ref 1.15–1.40)
HCT: 31 % — ABNORMAL LOW (ref 36.0–46.0)
Hemoglobin: 10.5 g/dL — ABNORMAL LOW (ref 12.0–15.0)
O2 Saturation: 94 %
Potassium: 4.4 mmol/L (ref 3.5–5.1)
Sodium: 131 mmol/L — ABNORMAL LOW (ref 135–145)
TCO2: 29 mmol/L (ref 22–32)
pCO2 arterial: 42.5 mmHg (ref 32–48)
pH, Arterial: 7.416 (ref 7.35–7.45)
pO2, Arterial: 70 mmHg — ABNORMAL LOW (ref 83–108)

## 2022-10-08 LAB — COMPREHENSIVE METABOLIC PANEL
ALT: 121 U/L — ABNORMAL HIGH (ref 0–44)
AST: 52 U/L — ABNORMAL HIGH (ref 15–41)
Albumin: 2.8 g/dL — ABNORMAL LOW (ref 3.5–5.0)
Alkaline Phosphatase: 106 U/L (ref 38–126)
Anion gap: 10 (ref 5–15)
BUN: 32 mg/dL — ABNORMAL HIGH (ref 8–23)
CO2: 29 mmol/L (ref 22–32)
Calcium: 8.7 mg/dL — ABNORMAL LOW (ref 8.9–10.3)
Chloride: 92 mmol/L — ABNORMAL LOW (ref 98–111)
Creatinine, Ser: 1.6 mg/dL — ABNORMAL HIGH (ref 0.44–1.00)
GFR, Estimated: 32 mL/min — ABNORMAL LOW (ref >60)
Glucose, Bld: 89 mg/dL (ref 70–99)
Potassium: 4.3 mmol/L (ref 3.5–5.1)
Sodium: 131 mmol/L — ABNORMAL LOW (ref 135–145)
Total Bilirubin: 0.5 mg/dL (ref 0.3–1.2)
Total Protein: 6.3 g/dL — ABNORMAL LOW (ref 6.5–8.1)

## 2022-10-08 LAB — C-REACTIVE PROTEIN: CRP: 1.3 mg/dL — ABNORMAL HIGH (ref <1.0)

## 2022-10-08 LAB — BRAIN NATRIURETIC PEPTIDE: B Natriuretic Peptide: 1156.1 pg/mL — ABNORMAL HIGH (ref 0.0–100.0)

## 2022-10-08 LAB — HEPARIN LEVEL (UNFRACTIONATED)
Heparin Unfractionated: 0.68 IU/mL (ref 0.30–0.70)
Heparin Unfractionated: 0.13 IU/mL — ABNORMAL LOW (ref 0.30–0.70)

## 2022-10-08 LAB — UREA NITROGEN, URINE: Urea Nitrogen, Ur: 376 mg/dL

## 2022-10-08 LAB — PROTIME-INR
INR: 1.3 — ABNORMAL HIGH (ref 0.8–1.2)
Prothrombin Time: 16.8 seconds — ABNORMAL HIGH (ref 11.4–15.2)

## 2022-10-08 LAB — MAGNESIUM: Magnesium: 2.2 mg/dL (ref 1.7–2.4)

## 2022-10-08 SURGERY — RIGHT/LEFT HEART CATH AND CORONARY ANGIOGRAPHY
Anesthesia: LOCAL

## 2022-10-08 MED ORDER — LIDOCAINE HCL (PF) 1 % IJ SOLN
INTRAMUSCULAR | Status: AC
  Filled 2022-10-08: qty 30

## 2022-10-08 MED ORDER — SODIUM CHLORIDE 0.9% FLUSH: 3.0000 mL | INTRAVENOUS | Status: DC | PRN

## 2022-10-08 MED ORDER — ACETAMINOPHEN 325 MG PO TABS
650.0000 mg | ORAL_TABLET | ORAL | Status: DC | PRN
  Administered 2022-10-10 – 2022-10-11 (×2): 650 mg via ORAL
  Filled 2022-10-08 (×2): qty 2

## 2022-10-08 MED ORDER — ONDANSETRON HCL 4 MG/2ML IJ SOLN
4.0000 mg | Freq: Four times a day (QID) | INTRAMUSCULAR | Status: DC | PRN
  Administered 2022-10-10 – 2022-10-11 (×2): 4 mg via INTRAVENOUS
  Filled 2022-10-08 (×2): qty 2

## 2022-10-08 MED ORDER — MIDAZOLAM HCL 2 MG/2ML IJ SOLN
INTRAMUSCULAR | Status: AC
  Filled 2022-10-08: qty 2

## 2022-10-08 MED ORDER — HEPARIN (PORCINE) 25000 UT/250ML-% IV SOLN
850.0000 [IU]/h | INTRAVENOUS | Status: DC
  Administered 2022-10-08 – 2022-10-11 (×3): 900 [IU]/h via INTRAVENOUS
  Filled 2022-10-08 (×3): qty 250

## 2022-10-08 MED ORDER — MIDAZOLAM HCL 2 MG/2ML IJ SOLN
INTRAMUSCULAR | Status: DC | PRN
  Administered 2022-10-08: 1 mg via INTRAVENOUS

## 2022-10-08 MED ORDER — HEPARIN (PORCINE) IN NACL 1000-0.9 UT/500ML-% IV SOLN
INTRAVENOUS | Status: DC | PRN
  Administered 2022-10-08 (×2): 500 mL

## 2022-10-08 MED ORDER — LIDOCAINE HCL (PF) 1 % IJ SOLN
INTRAMUSCULAR | Status: DC | PRN
  Administered 2022-10-08: 5 mL
  Administered 2022-10-08: 2 mL

## 2022-10-08 MED ORDER — WARFARIN SODIUM 5 MG PO TABS
5.0000 mg | ORAL_TABLET | Freq: Once | ORAL | Status: AC
  Administered 2022-10-08: 5 mg via ORAL
  Filled 2022-10-08: qty 1

## 2022-10-08 MED ORDER — VERAPAMIL HCL 2.5 MG/ML IV SOLN
INTRAVENOUS | Status: AC
  Filled 2022-10-08: qty 2

## 2022-10-08 MED ORDER — SODIUM CHLORIDE 0.9 % IV SOLN: INTRAVENOUS | Status: AC

## 2022-10-08 MED ORDER — VERAPAMIL HCL 2.5 MG/ML IV SOLN
INTRAVENOUS | Status: DC | PRN
  Administered 2022-10-08: 10 mL via INTRA_ARTERIAL

## 2022-10-08 MED ORDER — SODIUM CHLORIDE 0.9 % IV BOLUS
250.0000 mL | Freq: Once | INTRAVENOUS | Status: AC
  Administered 2022-10-08: 250 mL via INTRAVENOUS

## 2022-10-08 MED ORDER — SODIUM CHLORIDE 0.9% FLUSH
3.0000 mL | Freq: Two times a day (BID) | INTRAVENOUS | Status: DC
  Administered 2022-10-09 – 2022-10-14 (×5): 3 mL via INTRAVENOUS

## 2022-10-08 MED ORDER — SODIUM CHLORIDE 0.9 % IV SOLN: 250.0000 mL | INTRAVENOUS | Status: DC | PRN

## 2022-10-08 MED ORDER — LABETALOL HCL 5 MG/ML IV SOLN: 10.0000 mg | INTRAVENOUS | Status: AC | PRN

## 2022-10-08 MED ORDER — HYDRALAZINE HCL 20 MG/ML IJ SOLN: 10.0000 mg | INTRAMUSCULAR | Status: AC | PRN

## 2022-10-08 MED ORDER — WARFARIN - PHARMACIST DOSING INPATIENT: Freq: Every day | Status: DC

## 2022-10-08 MED ORDER — HEPARIN SODIUM (PORCINE) 1000 UNIT/ML IJ SOLN
INTRAMUSCULAR | Status: DC | PRN
  Administered 2022-10-08: 2500 [IU] via INTRAVENOUS

## 2022-10-08 MED ORDER — IOHEXOL 350 MG/ML SOLN
INTRAVENOUS | Status: DC | PRN
  Administered 2022-10-08: 40 mL

## 2022-10-08 SURGICAL SUPPLY — 13 items
CATH BALLN WEDGE 5F 110CM (CATHETERS) IMPLANT
CATH INFINITI JR4 5F (CATHETERS) IMPLANT
CATH OPTITORQUE TIG 4.0 5F (CATHETERS) IMPLANT
DEVICE RAD TR BAND REGULAR (VASCULAR PRODUCTS) IMPLANT
GLIDESHEATH SLEND SS 6F .021 (SHEATH) IMPLANT
GUIDEWIRE INQWIRE 1.5J.035X260 (WIRE) IMPLANT
INQWIRE 1.5J .035X260CM (WIRE) ×1
KIT HEART LEFT (KITS) ×1 IMPLANT
PACK CARDIAC CATHETERIZATION (CUSTOM PROCEDURE TRAY) ×1 IMPLANT
SHEATH GLIDE SLENDER 4/5FR (SHEATH) IMPLANT
SYR MEDRAD MARK 7 150ML (SYRINGE) ×1 IMPLANT
TRANSDUCER W/STOPCOCK (MISCELLANEOUS) ×1 IMPLANT
TUBING CIL FLEX 10 FLL-RA (TUBING) ×1 IMPLANT

## 2022-10-08 NOTE — Progress Notes (Signed)
ANTICOAGULATION CONSULT NOTE  Pharmacy Consult for Heparin/coumadin Indication:  mechanical valve  Allergies  Allergen Reactions   Ace Inhibitors Cough   Lipitor [Atorvastatin Calcium] Other (See Comments)    Knee pain    Megace [Megestrol] Other (See Comments)    High blood pressure and blurred vision   Aspirin Other (See Comments)    Nephrologist said to not take this Update 10/07/22 - patient clarifies this recommendation was lumped in with a whole bunch of other medications at that time many years ago when nephrologist told her to stay away from Tylenol, vitamins, ASA. Denies prior allergy or allergic reaction to this   Clorazepate Dipotassium Other (See Comments)    Interacts with a drug being taken   Simvastatin Other (See Comments)    Muscle pain   Tiotropium Bromide Monohydrate Other (See Comments)    Dry mouth   Codeine Nausea And Vomiting   Tape Rash   Patient Measurements: Height: 5\' 2"  (157.5 cm) Weight: 42.9 kg (94 lb 9.2 oz) IBW/kg (Calculated) : 50.1  Vital Signs: Temp: 98.4 F (36.9 C) (05/08 1629) Temp Source: Oral (05/08 1629) BP: 129/58 (05/08 1629) Pulse Rate: 70 (05/08 1629)  Labs: Recent Labs    10/06/22 0321 10/06/22 1747 10/07/22 0259 10/07/22 1222 10/07/22 1826 10/08/22 0402 10/08/22 1116 10/08/22 1128 10/08/22 1539  HGB 10.9*  --  11.5*  --   --  10.8* 10.9*  10.9* 10.5*  --   HCT 33.3*  --  34.4*  --   --  33.5* 32.0*  32.0* 31.0*  --   PLT 301  --  274  --   --  248  --   --   --   LABPROT 21.6*  --  17.9*  --   --  16.8*  --   --   --   INR 1.9*  --  1.5*  --   --  1.3*  --   --   --   HEPARINUNFRC  --    < > 0.25*   < > 0.27* 0.68  --   --  0.13*  CREATININE 1.40*  --  1.66*  --   --  1.60*  --   --   --    < > = values in this interval not displayed.    Estimated Creatinine Clearance: 19 mL/min (A) (by C-G formula based on SCr of 1.6 mg/dL (H)).  Medical History: Past Medical History:  Diagnosis Date   Allergy    seasonal    Anemia    Anxiety    Arthritis    Asthma    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 Protecta   Biventricular ICD (implantable cardioverter-defibrillator) in place    with CRT   Blood transfusion without reported diagnosis    Bursitis    Cataract    RIGHT EYE   CHF (congestive heart failure) (HCC)    Chronic kidney disease    Colon polyp    adenomatous   Complication of anesthesia    patient stated that had difficulty getting the breathing tube removed, patient said that she stopped breathing and HR dropped to 10  patient then woke up and started breathing pateint stated no longer than one minute; re-intubated in PACU following cholecystectomy 10/28/13   COPD (chronic obstructive pulmonary disease) (HCC)    Depression    Diverticulosis    Dysrhythmia    Fibromyalgia    GERD (gastroesophageal reflux disease)    Gout  H/O mitral valve replacement 2002, 2007   Heart murmur    Hemorrhoids    Hyperlipidemia    Hypertension    Hypothyroidism    IBS (irritable bowel syndrome)    MVP (mitral valve prolapse)    NAUSEA AND VOMITING 08/01/2009   Neuromuscular disorder (HCC)    fibromyalgia   Pacemaker    Peptic ulcer disease    Assessment: 62 yof with a history of mechanical mitral valve and paroxysmal atrial fibrillation on warfarin. Home dose is 5 mg (2.5 mg x 2) every Sun; 2.5 mg (2.5 mg x 1) all other days. Pharmacy consulted to transition to heparin when INR < 2.5 to allow for cardiac cath this week. INR now < 2.5.  Heparin level therapeutic 0.68 6 hours post-heparin rate change (drawn ~2 hours early). No issues with heparin infusion or signs of bleeding reported. Will empirically reduce to prevent supra therapeutic level.  Pt is s/p cath. Ok to resume coumadin and heparin 8 hr post TR band removal (2pm).   Goal of Therapy:  Heparin level 0.3 - 0.7 INR 2.5-3.5 Monitor platelets by anticoagulation protocol: Yes   Plan:  Resume heparin  infusion 900 units/hr at 10pm Check heparin level 6 hr and daily while on heparin Coumadin 5mg  PO x1  Continue to monitor H&H and platelets  Ulyses Southward, PharmD, BCIDP, AAHIVP, CPP Infectious Disease Pharmacist 10/08/2022 5:47 PM

## 2022-10-08 NOTE — Progress Notes (Signed)
PROGRESS NOTE        PATIENT DETAILS Name: [name] Age: 82 y.o. Sex: female Date of Birth: 01/13/1941 Admit Date: 10/03/2022 Admitting Physician Anselm Jungling, DO AVW:[id number], Barry Dienes, MD  Brief Summary: Patient is a 82 y.o.  female with history of HFrEF, mitral valve replacement-mechanical valve on Coumadin, COPD, HTN, PAF-who presented with abdominal pain with worsening orthopnea and exertional dyspnea x 7 days--found to have elevated transaminases-with concern for either congestive hepatitis or Tylenol toxicity-started on Mucomyst infusion after discussion with poison control and admitted to the hospitalist service.   Significant events: 5/3>> admit to [facility]  Significant studies: 5/3>> RUQ ultrasound: S/p cholecystectomy-nodular hepatic contour suspicious for cirrhosis 5/3>> CXR: Bilateral airspace disease-likely pulm edema 5/3>> x-ray abdomen: No bowel obstruction 5/3>> acute hepatitis serology: Negative 5/3>> lipase: Negative 5/3>> Tylenol level:<10  Significant microbiology data: None  Procedures: None  Consults: Cardiology  Subjective: Patient in bed, appears comfortable, denies any headache, no fever, no chest pain or pressure, no shortness of breath , no abdominal pain. No new focal weakness.  Objective: Vitals: Blood pressure (!) 140/65, pulse 73, temperature 97.8 F (36.6 C), temperature source Oral, resp. rate 17, height 5\' 2"  (1.575 m), weight 42.9 kg, SpO2 95 %.   Exam:  Awake Alert, No new F.N deficits, Normal affect [place].AT,PERRAL Supple Neck, No JVD,   Symmetrical Chest wall movement, Good air movement bilaterally, CTAB RRR,No Gallops, Rubs or new Murmurs,  +ve B.Sounds, Abd Soft, No tenderness,   No Cyanosis, Clubbing or edema    Assessment/Plan:  Elevated transaminases  -  etiology-likely from CHF and congestive otitis, acute hepatitis serology negative, she has had cholecystectomy in the past, seen by GI remotely,  per  GI nothing else to offer except diuresis as being done by cardiology.  LFTs are downtrending.  There was suspicion for possible mild Tylenol toxicity for which she has finished Mucomyst infusion.  Continue trending LFTs and INR.   Acute on chronic combined systolic and diastolic heart failure EF around 20 to 25%, has AICD.  No peripheral signs of volume overload-but CXR with pulm edema-significantly elevated BNP levels.  Cardiology on board and directing diuretic dose.  Much improved clinically.  Cards is contemplating cardiac catheterization this admission likely 10/08/2022 if renal function improves.  PAF with [place] vas 2 score of greater than 4 Beta-blocker Coumadin for pharmacy-INR therapeutic, will be transition to heparin once INR reaches below 2.5 as instructed by cardiology as they are contemplating cardiac catheterization this admission.  Mechanical mitral valve replacement 2002 Goal INR 2.5-3.5 Coumadin per pharmacy  Hypokalemia and hypomagnesemia.  Replaced.    AKI on CKD stage IIIb hold further diuretics on 10/07/2022, gentle IV fluids for total of 300 cc per cardiology, monitor, no overt CHF on exam  COPD Not in exacerbation Bronchodilators  HTN BP stable Beta-blocker holding diuretics as she has developed some AKI  Hypothyroidism Levothyroxine  Mood disorder Appears stable Wellbutrin/Xanax  Underweight: Estimated body mass index is 17.3 kg/m as calculated from the following:   Height as of this encounter: 5\' 2"  (1.575 m).   Weight as of this encounter: 42.9 kg.   Code status:   Code Status: Prior   DVT Prophylaxis:    Family Communication: None at bedside   Disposition Plan:  Status is: inpt The patient will require care spanning > 2  midnights and should be moved to inpatient because: Severity of illness   Planned Discharge Destination:Home   Diet: Diet Order             Diet NPO time specified Except for: Sips with Meds  Diet effective  midnight                     Antimicrobial agents: Anti-infectives (From admission, onward)    None        MEDICATIONS: Scheduled Meds:  allopurinol  300 mg Oral QPM   buPROPion  150 mg Oral q AM   feeding supplement  237 mL Oral BID BM   icosapent Ethyl  1 g Oral BID   levothyroxine  75 mcg Oral Q0600   memantine  5 mg Oral q morning   metoprolol succinate  50 mg Oral BID   ondansetron  4 mg Oral Once   pantoprazole  80 mg Oral Q1200   sodium chloride flush  3 mL Intravenous Q12H   Continuous Infusions:  sodium chloride     sodium chloride 10 mL/hr at 10/08/22 0622   heparin 900 Units/hr (10/08/22 0736)   PRN Meds:.sodium chloride, ALPRAZolam, sodium chloride flush   I have personally reviewed following labs and imaging studies  LABORATORY DATA:  Recent Labs  Lab 10/03/22 1332 10/05/22 0318 10/06/22 0321 10/07/22 0259 10/08/22 0402  WBC 6.5 8.2 8.8 9.5 7.2  HGB 9.9* 10.6* 10.9* 11.5* 10.8*  HCT 30.8* 32.3* 33.3* 34.4* 33.5*  PLT 271 276 301 274 248  MCV 95.1 92.3 94.9 92.2 95.2  MCH 30.6 30.3 31.1 30.8 30.7  MCHC 32.1 32.8 32.7 33.4 32.2  RDW 14.9 15.1 15.9* 16.1* 16.5*  LYMPHSABS  --  0.9 1.0 0.8 0.9  MONOABS  --  0.7 0.8 0.6 0.7  EOSABS  --  0.4 0.4 0.3 0.3  BASOSABS  --  0.0 0.0 0.0 0.0    Recent Labs  Lab 10/04/22 0259 10/04/22 0740 10/05/22 0254 10/05/22 0318 10/06/22 0321 10/07/22 0259 10/08/22 0402  NA 131*  --  133*  --  134* 129* 131*  K 4.1  --  3.1*  --  5.0 4.0 4.3  CL 96*  --  98  --  96* 92* 92*  CO2 22  --  25  --  25 26 29   ANIONGAP 13  --  10  --  13 11 10   GLUCOSE 148*  --  83  --  90 139* 89  BUN 25*  --  27*  --  29* 33* 32*  CREATININE 1.31*  --  1.26*  --  1.40* 1.66* 1.60*  AST 262*  --  167*  --  117* 80* 52*  ALT 333*  --  283*  --  232* 177* 121*  ALKPHOS 136*  --  135*  --  124 124 106  BILITOT 1.6*  --  0.8  --  0.7 0.7 0.5  ALBUMIN 2.6*  --  2.7*  --  2.9* 2.9* 2.8*  CRP  --   --  2.7*  --  1.7* 1.5*  1.3*  INR  --  3.6* 2.6*  --  1.9* 1.5* 1.3*  BNP 3,914.7*  --   --  3,750.4* 2,072.8* 1,164.3* 1,156.1*  MG  --   --  1.4*  --  2.3 1.7 2.2  CALCIUM 8.6*  --  8.6*  --  9.2 9.1 8.7*     Recent Labs  Lab 10/04/22 0259 10/04/22  0740 10/05/22 0254 10/05/22 0318 10/06/22 0321 10/07/22 0259 10/08/22 0402  CRP  --   --  2.7*  --  1.7* 1.5* 1.3*  INR  --  3.6* 2.6*  --  1.9* 1.5* 1.3*  BNP 3,914.7*  --   --  3,750.4* 2,072.8* 1,164.3* 1,156.1*  MG  --   --  1.4*  --  2.3 1.7 2.2  CALCIUM 8.6*  --  8.6*  --  9.2 9.1 8.7*    RADIOLOGY STUDIES/RESULTS: No results found.   LOS: 4 days   Signature  -    Susa Raring M.D on 10/08/2022 at 9:55 AM   -  To page go to [email or web address]

## 2022-10-08 NOTE — Progress Notes (Signed)
   Heart Failure Stewardship Pharmacist Progress Note   PCP: Garlan Fillers, MD PCP-Cardiologist: Nanetta Batty, MD   HPI:  82 YO female with a PMH of mechanical mitral valve on warfarin, biventricular PPM/ICD, chronic diastolic heart failure, paroxysmal atrial fibrillation, COPD,HTN, HLD, CKD3a   Patient presented to [facility] ED on 5/3 with complaints of abdominal pain. She also reports dyspnea this week requiring up to 3 pillows when she sleeps and felt her legs have been more edematous. She takes 20 mg of Lasix daily and missed just 1 dose the night prior to admission. CXR also showing vascular congestion. Recently her LVEF had declined further down to 20 to 25% after initially improving to 45 to 50% after CRT-D therapy in 2021. BNP 3914. Echo from 4/8 showing EF 20-25%, global hypokinesis, normal RV, severely dilated left atrial size, mild AVR, and moderate TVR. R/LHC from today (5/8) showing multiple mild stenoses and 50% stenosed mid LAD-dist LAD; PAP 23 mmHg, LVEDP 24 mmHg, CO 3.41, CI 2.45.    Current HF Medications: Beta Blocker: metoprolol succinate 50 mg BID  Prior to admission HF Medications: Diuretic: furosemide 20 mg PO daily Beta blocker: metoprolol succinate 50 mg BID MRA: spironolactone 25 mg daily  Pertinent Lab Values: Serum creatinine 1.60, BUN 32, Potassium 4.3, Sodium 131, BNP 1156, Magnesium 2.2  Vital Signs: Weight: 94 lbs (admission weight: 92 lbs) Blood pressure: 140s/60s  Heart rate: 70s  I/O: +154mL yesterday; net -2.5L  Medication Assistance / Insurance Benefits Check: Does the patient have prescription insurance?  Yes Type of insurance plan: Medicare  Does the patient qualify for medication assistance through manufacturers or grants?   Yes Eligible grants and/or patient assistance programs: None  Medication assistance applications in progress: None currently   Medication assistance applications approved: None Approved medication assistance renewals  will be completed by: N/A  Outpatient Pharmacy:  Prior to admission outpatient pharmacy: [facility] Is the patient willing to use [facility] TOC pharmacy at discharge? Yes Is the patient willing to transition their outpatient pharmacy to utilize a [facility] outpatient pharmacy? No   Assessment: 1. Acute on chronic systolic CHF (LVEF 20-25%), due to NICM. NYHA class IV symptoms. - Scr 1.66>>1.60 from yesterday while holding furosemide and spironolactone. Strict I's and O's. Keep K >4 and Mg >2.  - Continue holding furosemide and spironolactone given elevated Scr and R/LHC today (5/8) - Continue Toprol-XL 50mg  BID - Consider adding Entresto and SGLT2i if Scr improves post-diuresis/cath    Plan: 1) Medication changes recommended at this time: - Recommend holding furosemide and spironolactone today and follow-up renal function tomorrow AM - Consider adding Entresto and SGLT2i if Scr improves post-diuresis and R/LHC  2) Patient assistance: - None  3)  Education  - To be completed prior to discharge   [name], PharmD PGY1 Pharmacy Resident 5/8/20248:40 AM

## 2022-10-08 NOTE — Progress Notes (Signed)
Heart Failure Nurse Navigator Progress Note  PCP: Garlan Fillers, MD PCP-Cardiologist: [place] Admission Diagnosis: Acute liver failure without hepatic coma Admitted from: PCP office  Presentation:   [name] presented from PCP office with concerns of lower abdomen pain and a Hgb drop of 3 points. 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   Patient and her husband were educated on the sign and symptoms of heart failure, daily weights, when to call her doctor or go to the ED, Diet/ fluid restrictions, taking all medications as prescribed and attending all medical appointments, verbalized ger understanding, a HF TOC appointment was scheduled for 10/29/2022 @ 3 pm.   ECHO/ LVEF: 20-25%  Clinical Course:  Past Medical History:  Diagnosis Date   Allergy    seasonal   Anemia    Anxiety    Arthritis    Asthma    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 Protecta   Biventricular ICD (implantable cardioverter-defibrillator) in place    with CRT   Blood transfusion without reported diagnosis    Bursitis    Cataract    RIGHT EYE   CHF (congestive heart failure) (HCC)    Chronic kidney disease    Colon polyp    adenomatous   Complication of anesthesia    patient stated that had difficulty getting the breathing tube removed, patient said that she stopped breathing and HR dropped to 10  patient then woke up and started breathing pateint stated no longer than one minute; re-intubated in PACU following cholecystectomy 10/28/13   COPD (chronic obstructive pulmonary disease) (HCC)    Depression    Diverticulosis    Dysrhythmia    Fibromyalgia    GERD (gastroesophageal reflux disease)    Gout    H/O mitral valve replacement 2002, 2007   Heart murmur    Hemorrhoids    Hyperlipidemia    Hypertension    Hypothyroidism    IBS (irritable bowel syndrome)    MVP (mitral valve prolapse)    NAUSEA AND VOMITING 08/01/2009   Neuromuscular  disorder (HCC)    fibromyalgia   Pacemaker    Peptic ulcer disease      Social History   Socioeconomic History   Marital status: Married    Spouse name: [name]   Number of children: 4   Years of education: 12   Highest education level: Not on file  Occupational History   Occupation: retired  Tobacco Use   Smoking status: Former    Types: Cigarettes    Quit date: 06/02/2001    Years since quitting: 21.3   Smokeless tobacco: Never  Vaping Use   Vaping Use: Never used  Substance and Sexual Activity   Alcohol use: Yes    Alcohol/week: 0.0 standard drinks of alcohol    Comment: social-1 every 6 months   Drug use: No   Sexual activity: Not on file  Other Topics Concern   Not on file  Social History Narrative   Drinks very little caffeine    Social Determinants of Health   Financial Resource Strain: Not on file  Food Insecurity: No Food Insecurity (10/03/2022)   Hunger Vital Sign    Worried About Running Out of Food in the Last Year: Never true    Ran Out of Food in the Last Year: Never true  Transportation Needs: No Transportation Needs (10/03/2022)   PRAPARE - Administrator, Civil Service (Medical): No    Lack of Transportation (  Non-Medical): No  Physical Activity: Not on file  Stress: Not on file  Social Connections: Not on file   Education Assessment and Provision:  Detailed education and instructions provided on heart failure disease management including the following:  Signs and symptoms of Heart Failure When to call the physician Importance of daily weights Low sodium diet Fluid restriction Medication management Anticipated future follow-up appointments  Patient education given on each of the above topics.  Patient acknowledges understanding via teach back method and acceptance of all instructions.  Education Materials:  "Living Better With Heart Failure" Booklet, HF zone tool, & Daily Weight Tracker Tool.  Patient has scale at home: yes Patient  has pill box at home: NA    High Risk Criteria for Readmission and/or Poor Patient Outcomes: Heart failure hospital admissions (last 6 months): 1  No Show rate: 3% Difficult social situation: No, lives with husband Demonstrates medication adherence: yes Primary Language: English Literacy level: Reading, writing , and comprehension.   Barriers of Care:   Diet/ fluid restrictions ( patient was told to gain weight, down to 90 lbs, education of choosing the right foods)  Daily weights   Considerations/Referrals:   Referral made to Heart Failure Pharmacist Stewardship: Yes Referral made to Heart Failure CSW/NCM TOC: No Referral made to Heart & Vascular TOC clinic: Yes, 10/29/2022 @ 3 pm  Items for Follow-up on DC/TOC: Diet/ fluid restrictions ( patient advised by MD to try and re gain the weight she has lost )  Daily weights Continued HF education   Rhae Hammock, BSN, RN Heart Failure Print production planner Chat Only

## 2022-10-08 NOTE — Interval H&P Note (Signed)
Cath Lab Visit (complete for each Cath Lab visit)  Clinical Evaluation Leading to the Procedure:   ACS: No.  Non-ACS:    Anginal Classification: CCS II  Anti-ischemic medical therapy: Minimal Therapy (1 class of medications)  Non-Invasive Test Results: No non-invasive testing performed  Prior CABG: No previous CABG      History and Physical Interval Note:  10/08/2022 10:44 AM  [name]  has presented today for surgery, with the diagnosis of heart failure.  The various methods of treatment have been discussed with the patient and family. After consideration of risks, benefits and other options for treatment, the patient has consented to  Procedure(s): RIGHT/LEFT HEART CATH AND CORONARY ANGIOGRAPHY (N/A) as a surgical intervention.  The patient's history has been reviewed, patient examined, no change in status, stable for surgery.  I have reviewed the patient's chart and labs.  Questions were answered to the patient's satisfaction.     Nicki Guadalajara

## 2022-10-08 NOTE — TOC Initial Note (Signed)
Transition of Care [facility]) - Initial/Assessment Note    Patient Details  Name: [name] MRN: [id number] Date of Birth: 03-18-1941  Transition of Care [facility]) CM/SW Contact:    Mearl Latin, LCSW Phone Number: 10/08/2022, 8:34 AM  Clinical Narrative:                 Patient admitted from home with spouse. No needs identified at this time but please consult TOC if needed.   Expected Discharge Plan: Home/Self Care Barriers to Discharge: Continued Medical Work up   Patient Goals and CMS Choice            Expected Discharge Plan and Services       Living arrangements for the past 2 months: Single Family Home                                      Prior Living Arrangements/Services Living arrangements for the past 2 months: Single Family Home Lives with:: Spouse Patient language and need for interpreter reviewed:: Yes              Criminal Activity/Legal Involvement Pertinent to Current Situation/Hospitalization: No - Comment as needed  Activities of Daily Living Home Assistive Devices/Equipment: Dentures (specify type), Eyeglasses ADL Screening (condition at time of admission) Patient's cognitive ability adequate to safely complete daily activities?: Yes Is the patient deaf or have difficulty hearing?: No Does the patient have difficulty seeing, even when wearing glasses/contacts?: No Does the patient have difficulty concentrating, remembering, or making decisions?: No Patient able to express need for assistance with ADLs?: Yes Does the patient have difficulty dressing or bathing?: No Independently performs ADLs?: Yes (appropriate for developmental age) Does the patient have difficulty walking or climbing stairs?: No Weakness of Legs: None Weakness of Arms/Hands: None  Permission Sought/Granted                  Emotional Assessment       Orientation: : Oriented to Self, Oriented to Place, Oriented to  Time, Oriented to Situation Alcohol /  Substance Use: Not Applicable Psych Involvement: No (comment)  Admission diagnosis:  Transaminitis [R74.01] Abdominal pain [R10.9] Accidental acetaminophen overdose, initial encounter [T39.1X1A] Cirrhosis of liver without ascites, unspecified hepatic cirrhosis type (HCC) [K74.60] Acute liver failure without hepatic coma [K72.00] Patient Active Problem List   Diagnosis Date Noted   Transaminitis 10/06/2022   Abdominal pain 10/03/2022   Elevated LFTs 10/03/2022   Acute upper GI bleeding 11/08/2021   Acute GI bleeding 03/01/2021   Coagulopathy (HCC) 03/01/2021   Hematochezia    Acute blood loss anemia    Supratherapeutic INR    Status post reverse total shoulder replacement, left 10/27/2019   ICD (implantable cardioverter-defibrillator) lead failure 07/09/2018   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   Accelerated junctional rhythm 01/29/2018   Peptic ulcer disease    Pacemaker    Neuromuscular disorder (HCC)    MVP (mitral valve prolapse)    IBS (irritable bowel syndrome)    Hypothyroidism    Hemorrhoids    Hypertension    Mixed hyperlipidemia    Heart murmur    H/O mitral valve replacement    Gout    GERD (gastroesophageal reflux disease)    Fibromyalgia    Dysrhythmia    Diverticulosis    Depression    COPD (chronic obstructive pulmonary disease) (HCC)    Complication of anesthesia  Colon polyp    Chronic kidney disease    CHF (congestive heart failure) (HCC)    Cataract    Bursitis    Asthma    Arthritis    Anxiety    Anemia    Allergy    Pacemaker battery depletion 04/28/2017   Biventricular automatic implantable cardioverter defibrillator at end of life 04/28/2017   Malnutrition of moderate degree (Gomez: 60% to less than 75% of standard weight) (HCC) 03/30/2017   CKD (chronic kidney disease) stage 3, GFR 30-59 ml/min (HCC) 03/30/2017   Mild protein-calorie malnutrition (HCC) 09/22/2016   Acute on chronic combined systolic and diastolic CHF (congestive  heart failure) (HCC) 03/25/2016   Paroxysmal atrial fibrillation (HCC) 03/25/2016   S/P shoulder replacement 12/27/2015   Cholelithiasis with cholecystitis 10/28/2013   Long term (current) use of anticoagulants 08/19/2012   Biventricular ICD (implantable cardioverter-defibrillator) in place 04/10/2011   History of mitral valve replacement with mechanical valve 04/10/2011   CHEST PAIN 01/24/2010   Automatic implantable cardioverter-defibrillator in situ 01/24/2010   RECTAL BLEEDING 08/01/2009   NAUSEA AND VOMITING 08/01/2009   UTI'S, HX OF 08/01/2009   PERSONAL HX COLONIC POLYPS 08/01/2008   HYPERLIPIDEMIA 07/31/2008   Deficiency anemia 07/31/2008   INTERNAL HEMORRHOIDS 07/31/2008   GERD 07/31/2008   DIVERTICULOSIS, COLON 07/31/2008   BURSITIS 07/31/2008   PCP:  Garlan Fillers, MD Pharmacy:   [id number] [place], [place] - [address] [place] [address] Phone: [phone] Fax: [phone]  [facility] [address] [place] [address] Phone: [phone] Fax: [phone]     Social Determinants of Health (SDOH) Social History: SDOH Screenings   Food Insecurity: No Food Insecurity (10/03/2022)  Housing: Low Risk  (10/03/2022)  Transportation Needs: No Transportation Needs (10/03/2022)  Utilities: Not At Risk (10/03/2022)  Tobacco Use: Medium Risk (10/03/2022)   SDOH Interventions:     Readmission Risk Interventions     No data to display

## 2022-10-08 NOTE — Progress Notes (Addendum)
ANTICOAGULATION CONSULT NOTE  Pharmacy Consult for Heparin when INR < 2.5 Indication:  mechanical valve  Allergies  Allergen Reactions   Ace Inhibitors Cough   Lipitor [Atorvastatin Calcium] Other (See Comments)    Knee pain    Megace [Megestrol] Other (See Comments)    High blood pressure and blurred vision   Aspirin Other (See Comments)    Nephrologist said to not take this Update 10/07/22 - patient clarifies this recommendation was lumped in with a whole bunch of other medications at that time many years ago when nephrologist told her to stay away from Tylenol, vitamins, ASA. Denies prior allergy or allergic reaction to this   Clorazepate Dipotassium Other (See Comments)    Interacts with a drug being taken   Simvastatin Other (See Comments)    Muscle pain   Tiotropium Bromide Monohydrate Other (See Comments)    Dry mouth   Codeine Nausea And Vomiting   Tape Rash   Patient Measurements: Height: 5\' 2"  (157.5 cm) Weight: 42.9 kg (94 lb 9.2 oz) IBW/kg (Calculated) : 50.1  Vital Signs: Temp: 97.8 F (36.6 C) (05/08 0404) Temp Source: Oral (05/08 0404) BP: 140/65 (05/08 0404) Pulse Rate: 73 (05/08 0404)  Labs: Recent Labs    10/06/22 0321 10/06/22 1747 10/07/22 0259 10/07/22 1222 10/07/22 1826 10/08/22 0402  HGB 10.9*  --  11.5*  --   --  10.8*  HCT 33.3*  --  34.4*  --   --  33.5*  PLT 301  --  274  --   --  248  LABPROT 21.6*  --  17.9*  --   --  16.8*  INR 1.9*  --  1.5*  --   --  1.3*  HEPARINUNFRC  --    < > 0.25* 0.47 0.27* 0.68  CREATININE 1.40*  --  1.66*  --   --  1.60*   < > = values in this interval not displayed.   Estimated Creatinine Clearance: 19 mL/min (A) (by C-G formula based on SCr of 1.6 mg/dL (H)).  Medical History: Past Medical History:  Diagnosis Date   Allergy    seasonal   Anemia    Anxiety    Arthritis    Asthma    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 Protecta   Biventricular ICD (implantable  cardioverter-defibrillator) in place    with CRT   Blood transfusion without reported diagnosis    Bursitis    Cataract    RIGHT EYE   CHF (congestive heart failure) (HCC)    Chronic kidney disease    Colon polyp    adenomatous   Complication of anesthesia    patient stated that had difficulty getting the breathing tube removed, patient said that she stopped breathing and HR dropped to 10  patient then woke up and started breathing pateint stated no longer than one minute; re-intubated in PACU following cholecystectomy 10/28/13   COPD (chronic obstructive pulmonary disease) (HCC)    Depression    Diverticulosis    Dysrhythmia    Fibromyalgia    GERD (gastroesophageal reflux disease)    Gout    H/O mitral valve replacement 2002, 2007   Heart murmur    Hemorrhoids    Hyperlipidemia    Hypertension    Hypothyroidism    IBS (irritable bowel syndrome)    MVP (mitral valve prolapse)    NAUSEA AND VOMITING 08/01/2009   Neuromuscular disorder (HCC)    fibromyalgia   Pacemaker  Peptic ulcer disease    Assessment: 48 yof with a history of mechanical mitral valve and paroxysmal atrial fibrillation on warfarin. Home dose is 5 mg (2.5 mg x 2) every Sun; 2.5 mg (2.5 mg x 1) all other days. Pharmacy consulted to transition to heparin when INR < 2.5 to allow for cardiac cath this week. INR now < 2.5.  Heparin level therapeutic 0.68 6 hours post-heparin rate change (drawn ~2 hours early). No issues with heparin infusion or signs of bleeding reported. Will empirically reduce to prevent supra therapeutic level.  Goal of Therapy:  Heparin level 0.3 - 0.7 Monitor platelets by anticoagulation protocol: Yes   Plan:  Decrease heparin infusion to 900 units/hr Check heparin level in 8 hours and daily while on heparin Continue to monitor H&H and platelets  Thank you for allowing pharmacy to be a part of this patient's care.  Thelma Barge, PharmD Clinical Pharmacist

## 2022-10-08 NOTE — Progress Notes (Signed)
Mobility Specialist - Progress Note   10/08/22 1029  Mobility  Activity Off unit   Pt off unit for procedure. Will follow up if time permits.   [name]  Mobility Specialist Please contact via Special educational needs teacher or Rehab office at [phone]

## 2022-10-08 NOTE — Progress Notes (Signed)
Progress Note  Patient Name: [name] Date of Encounter: 10/08/2022  Primary Cardiologist: Nanetta Batty, MD  Subjective   Feeling well today, no complaints. Eager to proceed with cath.  Inpatient Medications    Scheduled Meds:  allopurinol  300 mg Oral QPM   buPROPion  150 mg Oral q AM   feeding supplement  237 mL Oral BID BM   icosapent Ethyl  1 g Oral BID   levothyroxine  75 mcg Oral Q0600   memantine  5 mg Oral q morning   metoprolol succinate  50 mg Oral BID   ondansetron  4 mg Oral Once   pantoprazole  80 mg Oral Q1200   sodium chloride flush  3 mL Intravenous Q12H   Continuous Infusions:  sodium chloride     sodium chloride 10 mL/hr at 10/08/22 0622   heparin 900 Units/hr (10/08/22 0736)   PRN Meds: sodium chloride, ALPRAZolam, sodium chloride flush   Vital Signs    Vitals:   10/07/22 1200 10/07/22 1949 10/08/22 0015 10/08/22 0404  BP: (!) 146/66 125/69 (!) 138/55 (!) 140/65  Pulse: 75 75 74 73  Resp: 18 20 17 17   Temp: 97.8 F (36.6 C) 98 F (36.7 C) 97.6 F (36.4 C) 97.8 F (36.6 C)  TempSrc: Oral Oral Oral Oral  SpO2:  95% 96% 95%  Weight:      Height:        Intake/Output Summary (Last 24 hours) at 10/08/2022 0912 Last data filed at 10/08/2022 0448 Gross per 24 hour  Intake --  Output 900 ml  Net -900 ml      10/07/2022    4:00 AM 10/06/2022    5:00 AM 10/05/2022    4:49 AM  Last 3 Weights  Weight (lbs) 94 lb 9.2 oz 92 lb 13 oz 99 lb 13.9 oz  Weight (kg) 42.9 kg 42.1 kg 45.3 kg     Telemetry    AV paced - Personally Reviewed  Physical Exam   GEN: No acute distress. Thin, cachectic appearing. HEENT: Normocephalic, atraumatic, sclera non-icteric. Neck: No JVD or bruits. Cardiac: RRR. Crisp valve sound. No murmurs, rubs, or gallops.  Respiratory: Clear to auscultation bilaterally. Breathing is unlabored. GI: Soft, nontender, non-distended, BS +x 4. MS: no deformity. Extremities: No clubbing or cyanosis. No edema. Distal pedal  pulses are 2+ and equal bilaterally. Neuro:  AAOx3. Follows commands. Psych:  Responds to questions appropriately with a normal affect.  Labs    High Sensitivity Troponin:  No results for input(s): "TROPONINIHS" in the last 720 hours.    Cardiac EnzymesNo results for input(s): "TROPONINI" in the last 168 hours. No results for input(s): "TROPIPOC" in the last 168 hours.   Chemistry Recent Labs  Lab 10/06/22 0321 10/07/22 0259 10/08/22 0402  NA 134* 129* 131*  K 5.0 4.0 4.3  CL 96* 92* 92*  CO2 25 26 29   GLUCOSE 90 139* 89  BUN 29* 33* 32*  CREATININE 1.40* 1.66* 1.60*  CALCIUM 9.2 9.1 8.7*  PROT 6.4* 6.8 6.3*  ALBUMIN 2.9* 2.9* 2.8*  AST 117* 80* 52*  ALT 232* 177* 121*  ALKPHOS 124 124 106  BILITOT 0.7 0.7 0.5  GFRNONAA 38* 31* 32*  ANIONGAP 13 11 10      Hematology Recent Labs  Lab 10/06/22 0321 10/07/22 0259 10/08/22 0402  WBC 8.8 9.5 7.2  RBC 3.51* 3.73* 3.52*  HGB 10.9* 11.5* 10.8*  HCT 33.3* 34.4* 33.5*  MCV 94.9 92.2 95.2  MCH 31.1  30.8 30.7  MCHC 32.7 33.4 32.2  RDW 15.9* 16.1* 16.5*  PLT 301 274 248    BNP Recent Labs  Lab 10/06/22 0321 10/07/22 0259 10/08/22 0402  BNP 2,072.8* 1,164.3* 1,156.1*     DDimer No results for input(s): "DDIMER" in the last 168 hours.   Radiology    No results found.  Cardiac Studies   2d echo 09/2022   1. Left ventricular ejection fraction, by estimation, is 20 to 25%. The  left ventricle has severely decreased function. The left ventricle  demonstrates global hypokinesis. The left ventricular internal cavity size  was mildly dilated. Left ventricular  diastolic parameters are indeterminate.   2. Right ventricular systolic function is normal. The right ventricular  size is normal.   3. Left atrial size was severely dilated.   4. The mitral valve has been repaired/replaced. No evidence of mitral  valve regurgitation. No evidence of mitral stenosis. Echo findings are  consistent with normal structure and  function of the mitral valve  prosthesis.   5. Tricuspid valve regurgitation is moderate.   6. The aortic valve is tricuspid. There is mild calcification of the  aortic valve. There is mild thickening of the aortic valve. Aortic valve  regurgitation is mild. Aortic valve sclerosis is present, with no evidence  of aortic valve stenosis. Aortic  regurgitation PHT measures 379 msec.   7. The inferior vena cava is normal in size with greater than 50%  respiratory variability, suggesting right atrial pressure of 3 mmHg.   Comparison(s): Prior images reviewed side by side.   Patient Profile     82 y.o. female with mechanical mitral valve (repaired 2002, replacement 2007), history of systolic heart failure s/p Medtronic biventricular ICD with initial improvement in LVEF but recent decline, hypertension, hyperlipidemia, CKD stage III, PAF, hypothyroidism, COPD, low BMI who presented to [facility] with abdominal discomfort and elevated LFTs felt possibly related to a/c systolic CHF.  Assessment & Plan    1. Acute on chronic HFrEF, h/o CRT-D - EF had beeen as low as 10% in the past, improved to 45-50% post CRT-D with recent decline 09/2022 with EF to 20-25% - diuresed this admission - cath delayed yesterday to slight uptrend in Cr, plateaued at 1.6 today - per Dr. Duke Salvia, OK to proceed with 250cc IVF beforehand, entered by cath lab nurse - got pre-cath ASA, hold off formal dosing until anatomy clarified - continue metoprolol - statin on hold - holding off on diuretics, ARB/ARNI, spiro, SGLT2i given need for cath with elevated Cr - also with low albumin in setting of low BMI, has Ensure on orders - add thyroid function in AM given known thyroid disease  Shared Decision Making/Informed Consent The risks [stroke (1 in 1000), death (1 in 1000), kidney failure [usually temporary] (1 in 500), bleeding (1 in 200), allergic reaction [possibly serious] (1 in 200)], benefits (diagnostic support and management  of coronary artery disease) and alternatives of a cardiac catheterization were discussed in detail with Ms. Stives and she is willing to proceed.   2. H/o MV repair 2002, replacement 2007 - echo as above with normal function/structure, also with mild AI, moderate TR - on Coumadin PTA, on heparin per pharmacy  3. Essential HTN - SBP acceptable in setting of age, holding diuretics for AKI  4. Hyperlipidemia  Lipid panel in 09/2022: Total Cholesterol 273, Triglycerides 383, HDL 34, LDL 165.  - Intolerant to high-intensity statins. On Pravastatin 40mg  daily at home but currently on hold  given elevated LFTs. - Continue Vascepa - prior notes indicate 2g BID but on 1g BID presently - She follows with Dr. Rennis Golden [facility] and it sounds like there has been some issues with non-compliance in the past.  5. Elevated LFTs - thought to be due to hepatic congestion, continue to improve gradually  6. AKI on CKD 3b, hyponatremia - baseline appears to be around 1.3-1.4, peak 1.66 on 5/7, plateaued today - Na stable compared to previous value, 131  7. PAF - AV paced on telemetry - Coumadin on hold for cath with heparin on board   For questions or updates, please contact [facility] Please consult [email or web address] for contact info under Cardiology/STEMI.  Signed, Laurann Montana, PA-C 10/08/2022, 9:12 AM

## 2022-10-09 DIAGNOSIS — I5043 Acute on chronic combined systolic (congestive) and diastolic (congestive) heart failure: Secondary | ICD-10-CM | POA: Diagnosis not present

## 2022-10-09 DIAGNOSIS — I2510 Atherosclerotic heart disease of native coronary artery without angina pectoris: Secondary | ICD-10-CM | POA: Diagnosis not present

## 2022-10-09 DIAGNOSIS — I480 Paroxysmal atrial fibrillation: Secondary | ICD-10-CM | POA: Diagnosis not present

## 2022-10-09 DIAGNOSIS — R7989 Other specified abnormal findings of blood chemistry: Secondary | ICD-10-CM | POA: Diagnosis not present

## 2022-10-09 LAB — CBC WITH DIFFERENTIAL/PLATELET
Abs Immature Granulocytes: 0.08 10*3/uL — ABNORMAL HIGH (ref 0.00–0.07)
Basophils Absolute: 0 10*3/uL (ref 0.0–0.1)
Basophils Relative: 1 %
Eosinophils Absolute: 0.3 10*3/uL (ref 0.0–0.5)
Eosinophils Relative: 4 %
HCT: 31.1 % — ABNORMAL LOW (ref 36.0–46.0)
Hemoglobin: 9.7 g/dL — ABNORMAL LOW (ref 12.0–15.0)
Immature Granulocytes: 1 %
Lymphocytes Relative: 10 %
Lymphs Abs: 0.7 10*3/uL (ref 0.7–4.0)
MCH: 30.7 pg (ref 26.0–34.0)
MCHC: 31.2 g/dL (ref 30.0–36.0)
MCV: 98.4 fL (ref 80.0–100.0)
Monocytes Absolute: 0.6 10*3/uL (ref 0.1–1.0)
Monocytes Relative: 8 %
Neutro Abs: 5.4 10*3/uL (ref 1.7–7.7)
Neutrophils Relative %: 76 %
Platelets: 205 10*3/uL (ref 150–400)
RBC: 3.16 MIL/uL — ABNORMAL LOW (ref 3.87–5.11)
RDW: 16.5 % — ABNORMAL HIGH (ref 11.5–15.5)
WBC: 7.1 10*3/uL (ref 4.0–10.5)
nRBC: 0 % (ref 0.0–0.2)

## 2022-10-09 LAB — COMPREHENSIVE METABOLIC PANEL
ALT: 85 U/L — ABNORMAL HIGH (ref 0–44)
AST: 38 U/L (ref 15–41)
Albumin: 2.8 g/dL — ABNORMAL LOW (ref 3.5–5.0)
Alkaline Phosphatase: 100 U/L (ref 38–126)
Anion gap: 12 (ref 5–15)
BUN: 32 mg/dL — ABNORMAL HIGH (ref 8–23)
CO2: 24 mmol/L (ref 22–32)
Calcium: 8.7 mg/dL — ABNORMAL LOW (ref 8.9–10.3)
Chloride: 95 mmol/L — ABNORMAL LOW (ref 98–111)
Creatinine, Ser: 1.73 mg/dL — ABNORMAL HIGH (ref 0.44–1.00)
GFR, Estimated: 30 mL/min — ABNORMAL LOW (ref >60)
Glucose, Bld: 92 mg/dL (ref 70–99)
Potassium: 4.5 mmol/L (ref 3.5–5.1)
Sodium: 131 mmol/L — ABNORMAL LOW (ref 135–145)
Total Bilirubin: 0.7 mg/dL (ref 0.3–1.2)
Total Protein: 5.9 g/dL — ABNORMAL LOW (ref 6.5–8.1)

## 2022-10-09 LAB — IRON AND TIBC
Iron: 43 ug/dL (ref 28–170)
Saturation Ratios: 14 % (ref 10.4–31.8)
TIBC: 305 ug/dL (ref 250–450)
UIBC: 262 ug/dL

## 2022-10-09 LAB — FOLATE: Folate: 26.9 ng/mL (ref >5.9)

## 2022-10-09 LAB — VITAMIN B12: Vitamin B-12: 1276 pg/mL — ABNORMAL HIGH (ref 180–914)

## 2022-10-09 LAB — PROTIME-INR
INR: 1.3 — ABNORMAL HIGH (ref 0.8–1.2)
Prothrombin Time: 16.3 seconds — ABNORMAL HIGH (ref 11.4–15.2)

## 2022-10-09 LAB — TSH: TSH: 2.151 u[IU]/mL (ref 0.350–4.500)

## 2022-10-09 LAB — BRAIN NATRIURETIC PEPTIDE: B Natriuretic Peptide: 927 pg/mL — ABNORMAL HIGH (ref 0.0–100.0)

## 2022-10-09 LAB — RETICULOCYTES
Immature Retic Fract: 11.9 % (ref 2.3–15.9)
RBC.: 3.31 MIL/uL — ABNORMAL LOW (ref 3.87–5.11)
Retic Count, Absolute: 139.7 10*3/uL (ref 19.0–186.0)
Retic Ct Pct: 4.2 % — ABNORMAL HIGH (ref 0.4–3.1)

## 2022-10-09 LAB — FERRITIN: Ferritin: 293 ng/mL (ref 11–307)

## 2022-10-09 LAB — T4, FREE: Free T4: 1.22 ng/dL — ABNORMAL HIGH (ref 0.61–1.12)

## 2022-10-09 LAB — HEPARIN LEVEL (UNFRACTIONATED): Heparin Unfractionated: 0.3 IU/mL (ref 0.30–0.70)

## 2022-10-09 MED ORDER — SPIRONOLACTONE 25 MG PO TABS
25.0000 mg | ORAL_TABLET | Freq: Every day | ORAL | Status: DC
  Administered 2022-10-09 – 2022-10-12 (×4): 25 mg via ORAL
  Filled 2022-10-09 (×4): qty 1

## 2022-10-09 MED ORDER — WARFARIN SODIUM 7.5 MG PO TABS: 7.5000 mg | ORAL_TABLET | Freq: Once | ORAL | Status: DC

## 2022-10-09 MED ORDER — HYDRALAZINE HCL 25 MG PO TABS
25.0000 mg | ORAL_TABLET | Freq: Two times a day (BID) | ORAL | Status: DC
  Administered 2022-10-09 (×2): 25 mg via ORAL
  Filled 2022-10-09 (×2): qty 1

## 2022-10-09 MED ORDER — GUAIFENESIN-DM 100-10 MG/5ML PO SYRP
15.0000 mL | ORAL_SOLUTION | ORAL | Status: DC | PRN
  Administered 2022-10-09: 15 mL via ORAL
  Filled 2022-10-09: qty 15

## 2022-10-09 MED ORDER — EMPAGLIFLOZIN 10 MG PO TABS
10.0000 mg | ORAL_TABLET | Freq: Every day | ORAL | Status: DC
  Administered 2022-10-09 – 2022-10-12 (×4): 10 mg via ORAL
  Filled 2022-10-09 (×4): qty 1

## 2022-10-09 MED ORDER — WARFARIN SODIUM 5 MG PO TABS
5.0000 mg | ORAL_TABLET | Freq: Once | ORAL | Status: AC
  Administered 2022-10-09: 5 mg via ORAL
  Filled 2022-10-09: qty 1

## 2022-10-09 NOTE — Progress Notes (Signed)
Rounding Note    Patient Name: [name] Date of Encounter: 10/09/2022  [facility] Cardiologist: Nanetta Batty, MD   Subjective   Patient sitting up on edge of bed this morning. She reports that she feels much better today than she has the past few days. Denies chest pain, palpitations, shortness of breath.   Inpatient Medications    Scheduled Meds:  allopurinol  300 mg Oral QPM   buPROPion  150 mg Oral q AM   empagliflozin  10 mg Oral Daily   feeding supplement  237 mL Oral BID BM   icosapent Ethyl  1 g Oral BID   levothyroxine  75 mcg Oral Q0600   memantine  5 mg Oral q morning   metoprolol succinate  50 mg Oral BID   ondansetron  4 mg Oral Once   pantoprazole  80 mg Oral Q1200   sodium chloride flush  3 mL Intravenous Q12H   sodium chloride flush  3 mL Intravenous Q12H   spironolactone  25 mg Oral Daily   Warfarin - Pharmacist Dosing Inpatient   Does not apply q1600   Continuous Infusions:  sodium chloride     heparin 900 Units/hr (10/08/22 2222)   PRN Meds: sodium chloride, acetaminophen, ALPRAZolam, ondansetron (ZOFRAN) IV, sodium chloride flush   Vital Signs    Vitals:   10/08/22 1629 10/08/22 1953 10/08/22 2109 10/09/22 0320  BP: (!) 129/58 (!) 145/56 (!) 116/92 (!) 144/62  Pulse: 70 74 78 73  Resp: 18 20  18   Temp: 98.4 F (36.9 C) 98.2 F (36.8 C)  97.8 F (36.6 C)  TempSrc: Oral Oral  Oral  SpO2: 96% 96%  95%  Weight:    40.9 kg  Height:        Intake/Output Summary (Last 24 hours) at 10/09/2022 1018 Last data filed at 10/08/2022 1509 Gross per 24 hour  Intake 390.75 ml  Output --  Net 390.75 ml      10/09/2022    3:20 AM 10/07/2022    4:00 AM 10/06/2022    5:00 AM  Last 3 Weights  Weight (lbs) 90 lb 3.2 oz 94 lb 9.2 oz 92 lb 13 oz  Weight (kg) 40.914 kg 42.9 kg 42.1 kg      Telemetry    AV paced - Personally Reviewed  ECG    No new tracing - Personally Reviewed  Physical Exam   GEN: No acute distress.   Neck:  No JVD Cardiac: RRR, no murmurs, rubs, or gallops. Crisp mechanical valve sound Respiratory: Clear to auscultation bilaterally. GI: Soft, nontender, non-distended  MS: No edema; No deformity. Neuro:  Nonfocal  Psych: Normal affect   Labs    High Sensitivity Troponin:  No results for input(s): "TROPONINIHS" in the last 720 hours.   Chemistry Recent Labs  Lab 10/06/22 0321 10/07/22 0259 10/08/22 0402 10/08/22 1116 10/08/22 1128 10/09/22 0623  NA 134* 129* 131* 134*  135 131* 131*  K 5.0 4.0 4.3 4.6  4.5 4.4 4.5  CL 96* 92* 92*  --   --  95*  CO2 25 26 29   --   --  24  GLUCOSE 90 139* 89  --   --  92  BUN 29* 33* 32*  --   --  32*  CREATININE 1.40* 1.66* 1.60*  --   --  1.73*  CALCIUM 9.2 9.1 8.7*  --   --  8.7*  MG 2.3 1.7 2.2  --   --   --  PROT 6.4* 6.8 6.3*  --   --  5.9*  ALBUMIN 2.9* 2.9* 2.8*  --   --  2.8*  AST 117* 80* 52*  --   --  38  ALT 232* 177* 121*  --   --  85*  ALKPHOS 124 124 106  --   --  100  BILITOT 0.7 0.7 0.5  --   --  0.7  GFRNONAA 38* 31* 32*  --   --  30*  ANIONGAP 13 11 10   --   --  12    Lipids No results for input(s): "CHOL", "TRIG", "HDL", "LABVLDL", "LDLCALC", "CHOLHDL" in the last 168 hours.  Hematology Recent Labs  Lab 10/07/22 0259 10/08/22 0402 10/08/22 1116 10/08/22 1128 10/09/22 0623  WBC 9.5 7.2  --   --  7.1  RBC 3.73* 3.52*  --   --  3.16*  HGB 11.5* 10.8* 10.9*  10.9* 10.5* 9.7*  HCT 34.4* 33.5* 32.0*  32.0* 31.0* 31.1*  MCV 92.2 95.2  --   --  98.4  MCH 30.8 30.7  --   --  30.7  MCHC 33.4 32.2  --   --  31.2  RDW 16.1* 16.5*  --   --  16.5*  PLT 274 248  --   --  205   Thyroid  Recent Labs  Lab 10/09/22 0623  TSH 2.151  FREET4 1.22*    BNP Recent Labs  Lab 10/07/22 0259 10/08/22 0402 10/09/22 0623  BNP 1,164.3* 1,156.1* 927.0*    DDimer No results for input(s): "DDIMER" in the last 168 hours.   Radiology    CARDIAC CATHETERIZATION  Result Date: 10/08/2022   Prox RCA lesion is 20% stenosed.   Prox  RCA to Mid RCA lesion is 30% stenosed.   Mid LM to Dist LM lesion is 20% stenosed.   Mid LAD lesion is 20% stenosed.   Mid LAD to Dist LAD lesion is 50% stenosed.   Mid Cx to Dist Cx lesion is 30% stenosed. Mild nonobstructive CAD with 20% smooth taper with mild calcification in the distal left main; 20% proximal and 50% mid LAD stenosis; large normal high OM/ramus like vessel; 30% smooth narrowing in the mid circumflex; and dominant RCA with 20 and 30% proximal to mid stenosis. Very mild elevation of right heart pressure with PA systolic at 32.  Mean PA pressure 23. Mechanical mitral valve with normal excursion. LVEDP 24 mm. RECOMMENDATION: Guideline directed medical therapy for HFrEF.  Medical therapy for nonobstructive CAD.  Resumption of warfarin tonight or tomorrow with heparinization with mechanical MVR until therapeutic.    Cardiac Studies   10/08/22 LHC/RHC    Prox RCA lesion is 20% stenosed.   Prox RCA to Mid RCA lesion is 30% stenosed.   Mid LM to Dist LM lesion is 20% stenosed.   Mid LAD lesion is 20% stenosed.   Mid LAD to Dist LAD lesion is 50% stenosed.   Mid Cx to Dist Cx lesion is 30% stenosed.   Mild nonobstructive CAD with 20% smooth taper with mild calcification in the distal left main; 20% proximal and 50% mid LAD stenosis; large normal high OM/ramus like vessel; 30% smooth narrowing in the mid circumflex; and dominant RCA with 20 and 30% proximal to mid stenosis.   Very mild elevation of right heart pressure with PA systolic at 32.  Mean PA pressure 23.   Mechanical mitral valve with normal excursion.   LVEDP 24 mm.   RECOMMENDATION: Guideline  directed medical therapy for HFrEF.  Medical therapy for nonobstructive CAD.  Resumption of warfarin tonight or tomorrow with heparinization with mechanical MVR until therapeutic.  Right Atrium RA: A-wave 3, V wave 5; mean 3 RV: 31/2 PA: 32/13; mean 23 PW: A-wave 14; V wave 23; mean 15  Ao: 133/48 LV: 130/24  Oxygen  saturation in the PA 57% and AO 94%  By the Fick method, cardiac output 3.4 L/min and cardiac index 2.6 L/min/m.  PVR: 2.4 WU   Diagnostic Dominance: Right   Patient Profile     82 y.o. female with mechanical mitral valve (repaired 2002, replacement 2007), history of systolic heart failure s/p Medtronic biventricular ICD with initial improvement in LVEF but recent decline, hypertension, hyperlipidemia, CKD stage III, PAF, hypothyroidism, COPD, low BMI who presented to [facility] with abdominal discomfort and elevated LFTs felt possibly related to acute systolic CHF.   Assessment & Plan    Acute on chronic HFrEF, h/o CRT-D  EF had beeen as low as 10% in the past, improved to 45-50% post CRT-D with recent decline 09/2022 with EF to 20-25%. LHC/RHC on 5/8 revealed mild non-obstructive CAD. LVEDP 24mm. CO by Fick 3.4, CI 2.6.  Given elevated LVEDP 24, would appear that patient requires further diuresis. However, clinically she appears to be relatively euvolemic. Given creatinine 1.60->1.73, would hold on loop diuretics today. Net negative 2.1L this admission. With elevated creatinine, will defer initiation of ARB or ARNI, MRA. If creatinine proves to be stable, could consider renal dose Entresto prior to discharge. Jardiance initiated by primary team this morning TSH 2.51 this morning with free T4 1.22.   H/o MV repair 2002, replacement 2007  Echo as above with normal function/structure, also with mild AI, moderate TR.  INR 1.3 this AM. On Coumadin PTA, on heparin per pharmacy   Essential HTN  SBP slightly elevated. Continue Toprol XL 50mg .    Hyperlipidemia   Lipid panel in 09/2022: Total Cholesterol 273, Triglycerides 383, HDL 34, LDL 165.   Intolerant to high-intensity statins. On Pravastatin 40mg  daily at home but currently on hold given elevated LFTs. Continue Vascepa - prior notes indicate 2g BID but on 1g BID presently   Elevated LFTs  Thought to be due to hepatic congestion,  improving.   Lab Results  Component Value Date   ALT 85 (H) 10/09/2022   AST 38 10/09/2022   ALKPHOS 100 10/09/2022   BILITOT 0.7 10/09/2022   Non obstructive CAD  LHC with non-obstructive CAD. Medical management.    AKI on CKD 3b, hyponatremia  Baseline appears to be around 1.3-1.4, 1.60->1.73 today.   PAF  AV paced on telemetry. Resume Warfarin per pharmacy dosing post cath.       For questions or updates, please contact [facility] Please consult [email or web address] for contact info under        Signed, Perlie Gold, PA-C  10/09/2022, 10:18 AM

## 2022-10-09 NOTE — Progress Notes (Signed)
   Heart Failure Stewardship Pharmacist Progress Note   PCP: Garlan Fillers, MD PCP-Cardiologist: Nanetta Batty, MD   HPI:  82 YO female with a PMH of mechanical mitral valve on warfarin, biventricular PPM/ICD, chronic diastolic heart failure, paroxysmal atrial fibrillation, COPD,HTN, HLD, CKD3a   Patient presented to [facility] ED on 5/3 with complaints of abdominal pain. She also reports dyspnea this week requiring up to 3 pillows when she sleeps and felt her legs have been more edematous. She takes 20 mg of Lasix daily and missed just 1 dose the night prior to admission. CXR also showing vascular congestion. Recently her LVEF had declined further down to 20 to 25% after initially improving to 45 to 50% after CRT-D therapy in 2021. BNP 3914. Echo from 4/8 showing EF 20-25%, global hypokinesis, normal RV, severely dilated left atrial size, mild AVR, and moderate TVR. R/LHC from today (5/8) showing multiple mild stenoses and 50% stenosed mid LAD-dist LAD; PAP 23 mmHg, LVEDP 24 mmHg, CO 3.41, CI 2.45.    Pt reported an allergy to [place] ~6 months ago described as severe foot swelling that required admission to the ED--pharmacy is waiting for a call back from her PCP regarding more specifics as this is not documented within her chart.  Current HF Medications: Beta Blocker: metoprolol succinate 50 mg BID  Prior to admission HF Medications: Diuretic: furosemide 20 mg PO daily Beta blocker: metoprolol succinate 50 mg BID MRA: spironolactone 25 mg daily  Pertinent Lab Values: Serum creatinine 1.73, BUN 32, Potassium 4.5, Sodium 131, BNP 927, Magnesium 2.2  Vital Signs: Weight: 90 lbs (admission weight: 92 lbs) Blood pressure: 130s/60s  Heart rate: 70s  I/O: - yesterday; net -2.1L  Medication Assistance / Insurance Benefits Check: Does the patient have prescription insurance?  Yes Type of insurance plan: Medicare  Does the patient qualify for medication assistance through manufacturers  or grants?   Yes Eligible grants and/or patient assistance programs: None  Medication assistance applications in progress: None currently   Medication assistance applications approved: None Approved medication assistance renewals will be completed by: N/A  Outpatient Pharmacy:  Prior to admission outpatient pharmacy: [facility] Is the patient willing to use [facility] TOC pharmacy at discharge? Yes Is the patient willing to transition their outpatient pharmacy to utilize a [facility] outpatient pharmacy? No   Assessment: 1. Acute on chronic systolic CHF (LVEF 20-25%), due to NICM. NYHA class IV symptoms. - Scr 1.60>>1.73 from yesterday while holding furosemide and spironolactone. Strict I's and O's. Keep K >4 and Mg >2.  - Given LVEDP 24 mmHg, agree with starting spironolactone today and will monitor Scr closely  - Jardiance 10mg  started 5/9 and was given prior to knowledge of possible Farxiga reaction ~6 months ago (patient reported) - Recommend continuing to hold furosemide given increase in Scr from yesterday - Continue Toprol-XL 50mg  BID - Consider adding Entresto outpatient pending Scr improvement    Plan: 1) Medication changes recommended at this time: - Start spironolactone 25 mg daily  - Start Jardiance 10 mg daily and f/u tomorrow given patient reported [place] reaction ~6 months ago  - Recommend continuing to hold furosemide given increase in Scr from yesterday - Consider adding Entresto outpatient pending Scr improvement   2) Patient assistance: - None  3)  Education  - To be completed prior to discharge   [name], PharmD PGY1 Pharmacy Resident 5/9/20248:30 AM

## 2022-10-09 NOTE — TOC Progression Note (Signed)
Transition of Care [facility]) - Progression Note    Patient Details  Name: [name] MRN: [id number] Date of Birth: January 02, 1941  [facility] CM/SW Contact  Graves-Bigelow, Lamar Laundry, RN Phone Number: 10/09/2022, 1:14 PM  Clinical Narrative:  Patient was transferred from 5W. Post R/LHC.  PTA patient was from home with spouse. Patient has DME: rollator, cane, and rolling walker. Spouse takes patient to all appointments and she receives her medications without any issues. Case Manager will continue to follow for transition of care needs as the patient progresses.    Expected Discharge Plan: Home/Self Care Barriers to Discharge: Continued Medical Work up  Expected Discharge Plan and Services In-house Referral: NA Discharge Planning Services: CM Consult Post Acute Care Choice: NA Living arrangements for the past 2 months: Single Family Home                   DME Agency: NA  Social Determinants of Health (SDOH) Interventions SDOH Screenings   Food Insecurity: No Food Insecurity (10/03/2022)  Housing: Low Risk  (10/03/2022)  Transportation Needs: No Transportation Needs (10/03/2022)  Utilities: Not At Risk (10/03/2022)  Alcohol Screen: Low Risk  (10/09/2022)  Financial Resource Strain: Low Risk  (10/09/2022)  Tobacco Use: Medium Risk (10/08/2022)    Readmission Risk Interventions     No data to display

## 2022-10-09 NOTE — Progress Notes (Signed)
ANTICOAGULATION CONSULT NOTE  Pharmacy Consult for Heparin/coumadin Indication:  mechanical valve  Allergies  Allergen Reactions   Ace Inhibitors Cough   Lipitor [Atorvastatin Calcium] Other (See Comments)    Knee pain    Megace [Megestrol] Other (See Comments)    High blood pressure and blurred vision   Aspirin Other (See Comments)    Nephrologist said to not take this Update 10/07/22 - patient clarifies this recommendation was lumped in with a whole bunch of other medications at that time many years ago when nephrologist told her to stay away from Tylenol, vitamins, ASA. Denies prior allergy or allergic reaction to this   Clorazepate Dipotassium Other (See Comments)    Interacts with a drug being taken   Simvastatin Other (See Comments)    Muscle pain   Tiotropium Bromide Monohydrate Other (See Comments)    Dry mouth   Codeine Nausea And Vomiting   Tape Rash   Patient Measurements: Height: 5\' 2"  (157.5 cm) Weight: 40.9 kg (90 lb 3.2 oz) IBW/kg (Calculated) : 50.1  Vital Signs: Temp: 97.8 F (36.6 C) (05/09 0320) Temp Source: Oral (05/09 0320) BP: 142/61 (05/09 1318) Pulse Rate: 72 (05/09 1053)  Labs: Recent Labs    10/07/22 0259 10/07/22 1222 10/08/22 0402 10/08/22 1116 10/08/22 1128 10/08/22 1539 10/09/22 0623  HGB 11.5*  --  10.8* 10.9*  10.9* 10.5*  --  9.7*  HCT 34.4*  --  33.5* 32.0*  32.0* 31.0*  --  31.1*  PLT 274  --  248  --   --   --  205  LABPROT 17.9*  --  16.8*  --   --   --  16.3*  INR 1.5*  --  1.3*  --   --   --  1.3*  HEPARINUNFRC 0.25*   < > 0.68  --   --  0.13* 0.30  CREATININE 1.66*  --  1.60*  --   --   --  1.73*   < > = values in this interval not displayed.    Estimated Creatinine Clearance: 16.7 mL/min (A) (by C-G formula based on SCr of 1.73 mg/dL (H)).  Medical History: Past Medical History:  Diagnosis Date   Allergy    seasonal   Anemia    Anxiety    Arthritis    Asthma    Automatic implantable cardioverter-defibrillator  in situ    Medtronic Protecta   Biventricular ICD (implantable cardioverter-defibrillator) in place    with CRT   Blood transfusion without reported diagnosis    Bursitis    Cataract    RIGHT EYE   CHF (congestive heart failure) (HCC)    Chronic kidney disease    Colon polyp    adenomatous   Complication of anesthesia    patient stated that had difficulty getting the breathing tube removed, patient said that she stopped breathing and HR dropped to 10  patient then woke up and started breathing pateint stated no longer than one minute; re-intubated in PACU following cholecystectomy 10/28/13   COPD (chronic obstructive pulmonary disease) (HCC)    Depression    Diverticulosis    Dysrhythmia    Fibromyalgia    GERD (gastroesophageal reflux disease)    Gout    H/O mitral valve replacement 2002, 2007   Heart murmur    Hemorrhoids    Hyperlipidemia    Hypertension    Hypothyroidism    IBS (irritable bowel syndrome)    MVP (mitral valve prolapse)    NAUSEA  AND VOMITING 08/01/2009   Neuromuscular disorder (HCC)    fibromyalgia   Pacemaker    Peptic ulcer disease    Assessment: 42 yof with a history of mechanical mitral valve and paroxysmal atrial fibrillation on warfarin. Home dose is 5 mg (2.5 mg x 2) every Sun; 2.5 mg (2.5 mg x 1) all other days. Pharmacy consulted to transition to heparin when INR < 2.5 to allow for cardiac cath this week. INR now < 2.5.  Heparin level therapeutic this AM. No issues with heparin infusion or signs of bleeding reported.   INR still low this AM.  Coumadin re-initiated last night.  Goal of Therapy:  Heparin level 0.3 - 0.7 INR 2.5-3.5 Monitor platelets by anticoagulation protocol: Yes   Plan:  Continue IV heparin at 900 units/hr Coumadin 5 mg po x 1 tonight. Daily heparin level, INR and CBC.  Reece Leader, Colon Flattery, BCCP Clinical Pharmacist  10/09/2022 1:33 PM   [facility] pharmacy phone numbers are listed on [email or web address]

## 2022-10-09 NOTE — Progress Notes (Addendum)
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 -Initially treated with Mucomyst infusion given questionable concern for Tylenol toxicity -Treated for CHF exacerbation, primarily suspected to have congestive hepatopathy  Subjective: -Feels better overall denies any symptoms  Assessment and Plan:  Acute on chronic combined systolic and diastolic CHF -admitted with orthopnea PND and swelling -Last echo 07/2022 with EF of 20 to 25%, normal RV, mitral valve unremarkable -Has been diuresed with IV Lasix, she is down 9 pounds, clinically appears euvolemic -Right and left heart cath 5/8 with mild nonobstructive CAD, mild elevation of right heart pressures -Cards following, will add Jardiance and Aldactone  Abnormal LFTs -Secondary to passive hepatic congestion, in the background of liver cirrhosis, no obstructive pathology -Was also initially treated for Tylenol toxicity -undetectable Tylenol level  Suspected cirrhosis -?  Cardiac cirrhosis versus NAFLD -Diuretics as above, add Aldactone  Normocytic anemia -Check anemia panel  COPD (chronic obstructive pulmonary disease) (HCC) - Stable and not in exacerbation - Continue home bronchodilator  Mixed hyperlipidemia -continue Vascepa -holding statin   Hypertension - Continue beta-blocker and spironolactone  Hypothyroidism - Continue levothyroxine  Paroxysmal atrial fibrillation (HCC) - Continue beta-blocker  History of mitral valve replacement with mechanical valve -INR is subtherapeutic, continue warfarin/heparin  Biventricular ICD (implantable cardioverter-defibrillator) in place Noted  Mood disorder -Stable, continue Wellbutrin and Xanax per home  regimen      DVT prophylaxis: Code Status:  Family Communication: Disposition Plan:   Consultants:    Procedures:   Antimicrobials:    Objective: Vitals:   10/08/22 1629 10/08/22 1953 10/08/22 2109 10/09/22 0320  BP: (!) 129/58 (!) 145/56 (!) 116/92 (!) 144/62  Pulse: 70 74 78 73  Resp: 18 20  18   Temp: 98.4 F (36.9 C) 98.2 F (36.8 C)  97.8 F (36.6 C)  TempSrc: Oral Oral  Oral  SpO2: 96% 96%  95%  Weight:    40.9 kg  Height:        Intake/Output Summary (Last 24 hours) at 10/09/2022 1057 Last data filed at 10/08/2022 1509 Gross per 24 hour  Intake 390.75 ml  Output --  Net 390.75 ml   Filed Weights   10/06/22 0500 10/07/22 0400 10/09/22 0320  Weight: 42.1 kg 42.9 kg 40.9 kg    Examination:  General exam: Appears calm and comfortable  Respiratory system: Clear to auscultation Cardiovascular system: S1 & S2 heard, RRR.  Abd: nondistended, soft and nontender.Normal bowel sounds heard. Central nervous system: Alert and oriented. No focal neurological deficits. Extremities: no edema Skin: No rashes Psychiatry:  Mood & affect appropriate.     Data Reviewed:   CBC: Recent Labs  Lab 10/05/22 0318 10/06/22 0321 10/07/22 0259 10/08/22 0402 10/08/22 1116 10/08/22 1128 10/09/22 0623  WBC 8.2 8.8 9.5 7.2  --   --  7.1  NEUTROABS 6.2 6.6 7.6 5.2  --   --  5.4  HGB 10.6* 10.9* 11.5* 10.8* 10.9*  10.9* 10.5* 9.7*  HCT 32.3* 33.3* 34.4* 33.5* 32.0*  32.0* 31.0* 31.1*  MCV 92.3 94.9 92.2 95.2  --   --  98.4  PLT 276 301 274 248  --   --  205   Basic Metabolic Panel: Recent Labs  Lab 10/05/22 0254 10/06/22 0321 10/07/22 0259 10/08/22 0402 10/08/22  1116 10/08/22 1128 10/09/22 0623  NA 133* 134* 129* 131* 134*  135 131* 131*  K 3.1* 5.0 4.0 4.3 4.6  4.5 4.4 4.5  CL 98 96* 92* 92*  --   --  95*  CO2 25 25 26 29   --   --  24  GLUCOSE 83 90 139* 89  --   --  92  BUN 27* 29* 33* 32*  --   --  32*  CREATININE 1.26* 1.40* 1.66* 1.60*  --   --   1.73*  CALCIUM 8.6* 9.2 9.1 8.7*  --   --  8.7*  MG 1.4* 2.3 1.7 2.2  --   --   --    GFR: Estimated Creatinine Clearance: 16.7 mL/min (A) (by C-G formula based on SCr of 1.73 mg/dL (H)). Liver Function Tests: Recent Labs  Lab 10/05/22 0254 10/06/22 0321 10/07/22 0259 10/08/22 0402 10/09/22 0623  AST 167* 117* 80* 52* 38  ALT 283* 232* 177* 121* 85*  ALKPHOS 135* 124 124 106 100  BILITOT 0.8 0.7 0.7 0.5 0.7  PROT 5.9* 6.4* 6.8 6.3* 5.9*  ALBUMIN 2.7* 2.9* 2.9* 2.8* 2.8*   Recent Labs  Lab 10/03/22 1332  LIPASE 37   No results for input(s): "AMMONIA" in the last 168 hours. Coagulation Profile: Recent Labs  Lab 10/05/22 0254 10/06/22 0321 10/07/22 0259 10/08/22 0402 10/09/22 0623  INR 2.6* 1.9* 1.5* 1.3* 1.3*   Cardiac Enzymes: No results for input(s): "CKTOTAL", "CKMB", "CKMBINDEX", "TROPONINI" in the last 168 hours. BNP (last 3 results) No results for input(s): "PROBNP" in the last 8760 hours. HbA1C: No results for input(s): "HGBA1C" in the last 72 hours. CBG: No results for input(s): "GLUCAP" in the last 168 hours. Lipid Profile: No results for input(s): "CHOL", "HDL", "LDLCALC", "TRIG", "CHOLHDL", "LDLDIRECT" in the last 72 hours. Thyroid Function Tests: Recent Labs    10/09/22 0623  TSH 2.151  FREET4 1.22*   Anemia Panel: No results for input(s): "VITAMINB12", "FOLATE", "FERRITIN", "TIBC", "IRON", "RETICCTPCT" in the last 72 hours. Urine analysis:    Component Value Date/Time   COLORURINE STRAW (A) 10/04/2022 0603   APPEARANCEUR CLEAR 10/04/2022 0603   LABSPEC 1.006 10/04/2022 0603   PHURINE 5.0 10/04/2022 0603   GLUCOSEU NEGATIVE 10/04/2022 0603   HGBUR MODERATE (A) 10/04/2022 0603   BILIRUBINUR NEGATIVE 10/04/2022 0603   KETONESUR NEGATIVE 10/04/2022 0603   PROTEINUR 100 (A) 10/04/2022 0603   NITRITE NEGATIVE 10/04/2022 0603   LEUKOCYTESUR NEGATIVE 10/04/2022 0603   Sepsis Labs: @LABRCNTIP (procalcitonin:4,lacticidven:4)  )No results found  for this or any previous visit (from the past 240 hour(s)).   Radiology Studies: CARDIAC CATHETERIZATION  Result Date: 10/08/2022   Prox RCA lesion is 20% stenosed.   Prox RCA to Mid RCA lesion is 30% stenosed.   Mid LM to Dist LM lesion is 20% stenosed.   Mid LAD lesion is 20% stenosed.   Mid LAD to Dist LAD lesion is 50% stenosed.   Mid Cx to Dist Cx lesion is 30% stenosed. Mild nonobstructive CAD with 20% smooth taper with mild calcification in the distal left main; 20% proximal and 50% mid LAD stenosis; large normal high OM/ramus like vessel; 30% smooth narrowing in the mid circumflex; and dominant RCA with 20 and 30% proximal to mid stenosis. Very mild elevation of right heart pressure with PA systolic at 32.  Mean PA pressure 23. Mechanical mitral valve with normal excursion. LVEDP 24 mm. RECOMMENDATION: Guideline directed medical therapy for HFrEF.  Medical therapy  for nonobstructive CAD.  Resumption of warfarin tonight or tomorrow with heparinization with mechanical MVR until therapeutic.     Scheduled Meds:  allopurinol  300 mg Oral QPM   buPROPion  150 mg Oral q AM   empagliflozin  10 mg Oral Daily   feeding supplement  237 mL Oral BID BM   icosapent Ethyl  1 g Oral BID   levothyroxine  75 mcg Oral Q0600   memantine  5 mg Oral q morning   metoprolol succinate  50 mg Oral BID   ondansetron  4 mg Oral Once   pantoprazole  80 mg Oral Q1200   sodium chloride flush  3 mL Intravenous Q12H   sodium chloride flush  3 mL Intravenous Q12H   spironolactone  25 mg Oral Daily   Warfarin - Pharmacist Dosing Inpatient   Does not apply q1600   Continuous Infusions:  sodium chloride     heparin 900 Units/hr (10/08/22 2222)     LOS: 5 days    Time spent:    Zannie Cove, MD [facility]   10/09/2022, 10:57 AM

## 2022-10-09 NOTE — Progress Notes (Signed)
Mobility Specialist Progress Note:    10/09/22 1355  Mobility  Activity Ambulated with assistance in hallway  Level of Assistance Contact guard assist, steadying assist  Assistive Device Four wheel walker  Distance Ambulated (ft) 250 ft  Activity Response Tolerated well  Mobility Referral Yes  $Mobility charge 1 Mobility  Mobility Specialist Start Time (ACUTE ONLY) 1340  Mobility Specialist Stop Time (ACUTE ONLY) 1355  Mobility Specialist Time Calculation (min) (ACUTE ONLY) 15 min   Pt received in bed agreeable to ambulate. No c/o throughout session. Pt back in bed with call light and necessities at hand.  Thompson Grayer Mobility Specialist  Please contact vis Secure Chat or  Rehab Office [phone]

## 2022-10-10 DIAGNOSIS — R7989 Other specified abnormal findings of blood chemistry: Secondary | ICD-10-CM | POA: Diagnosis not present

## 2022-10-10 DIAGNOSIS — I5043 Acute on chronic combined systolic (congestive) and diastolic (congestive) heart failure: Secondary | ICD-10-CM | POA: Diagnosis not present

## 2022-10-10 DIAGNOSIS — I480 Paroxysmal atrial fibrillation: Secondary | ICD-10-CM | POA: Diagnosis not present

## 2022-10-10 LAB — CBC
HCT: 31.9 % — ABNORMAL LOW (ref 36.0–46.0)
Hemoglobin: 10.4 g/dL — ABNORMAL LOW (ref 12.0–15.0)
MCH: 31.2 pg (ref 26.0–34.0)
MCHC: 32.6 g/dL (ref 30.0–36.0)
MCV: 95.8 fL (ref 80.0–100.0)
Platelets: 220 10*3/uL (ref 150–400)
RBC: 3.33 MIL/uL — ABNORMAL LOW (ref 3.87–5.11)
RDW: 16.4 % — ABNORMAL HIGH (ref 11.5–15.5)
WBC: 8.7 10*3/uL (ref 4.0–10.5)
nRBC: 0 % (ref 0.0–0.2)

## 2022-10-10 LAB — BASIC METABOLIC PANEL
Anion gap: 11 (ref 5–15)
BUN: 34 mg/dL — ABNORMAL HIGH (ref 8–23)
CO2: 22 mmol/L (ref 22–32)
Calcium: 9 mg/dL (ref 8.9–10.3)
Chloride: 97 mmol/L — ABNORMAL LOW (ref 98–111)
Creatinine, Ser: 1.47 mg/dL — ABNORMAL HIGH (ref 0.44–1.00)
GFR, Estimated: 36 mL/min — ABNORMAL LOW (ref >60)
Glucose, Bld: 124 mg/dL — ABNORMAL HIGH (ref 70–99)
Potassium: 4.8 mmol/L (ref 3.5–5.1)
Sodium: 130 mmol/L — ABNORMAL LOW (ref 135–145)

## 2022-10-10 LAB — HEPARIN LEVEL (UNFRACTIONATED): Heparin Unfractionated: 0.68 IU/mL (ref 0.30–0.70)

## 2022-10-10 LAB — PROTIME-INR
INR: 1.4 — ABNORMAL HIGH (ref 0.8–1.2)
Prothrombin Time: 17.5 seconds — ABNORMAL HIGH (ref 11.4–15.2)

## 2022-10-10 MED ORDER — FERROUS SULFATE 325 (65 FE) MG PO TABS
325.0000 mg | ORAL_TABLET | Freq: Every day | ORAL | Status: DC
  Administered 2022-10-11 – 2022-10-14 (×4): 325 mg via ORAL
  Filled 2022-10-10 (×4): qty 1

## 2022-10-10 MED ORDER — FUROSEMIDE 20 MG PO TABS
20.0000 mg | ORAL_TABLET | Freq: Every day | ORAL | Status: DC
  Administered 2022-10-11: 20 mg via ORAL
  Filled 2022-10-10 (×2): qty 1

## 2022-10-10 MED ORDER — WARFARIN SODIUM 7.5 MG PO TABS
7.5000 mg | ORAL_TABLET | Freq: Once | ORAL | Status: AC
  Administered 2022-10-10: 7.5 mg via ORAL
  Filled 2022-10-10: qty 1

## 2022-10-10 MED ORDER — HYDRALAZINE HCL 50 MG PO TABS
50.0000 mg | ORAL_TABLET | Freq: Three times a day (TID) | ORAL | Status: DC
  Administered 2022-10-10 – 2022-10-12 (×6): 50 mg via ORAL
  Filled 2022-10-10 (×6): qty 1

## 2022-10-10 MED ORDER — TRAZODONE HCL 50 MG PO TABS
50.0000 mg | ORAL_TABLET | Freq: Every evening | ORAL | Status: DC | PRN
  Administered 2022-10-10: 50 mg via ORAL
  Filled 2022-10-10: qty 1

## 2022-10-10 MED ORDER — SODIUM CHLORIDE 0.9 % IV SOLN
250.0000 mg | Freq: Every day | INTRAVENOUS | Status: DC
  Filled 2022-10-10: qty 20

## 2022-10-10 NOTE — Progress Notes (Signed)
Mobility Specialist Progress Note:    10/10/22 1510  Mobility  Activity Ambulated with assistance in hallway  Level of Assistance Contact guard assist, steadying assist  Assistive Device Four wheel walker  Distance Ambulated (ft) 425 ft  Activity Response Tolerated well  Mobility Referral Yes  $Mobility charge 1 Mobility  Mobility Specialist Start Time (ACUTE ONLY) 1455  Mobility Specialist Stop Time (ACUTE ONLY) 1510  Mobility Specialist Time Calculation (min) (ACUTE ONLY) 15 min   Pt received in chair, eager to ambulate. VSS and no c/o throughout. Pt assisted back in bed with call light and necessities at hand.  Thompson Grayer Mobility Specialist  Please contact vis Secure Chat or  Rehab Office [phone]

## 2022-10-10 NOTE — Progress Notes (Signed)
Rounding Note    Patient Name: [name] Date of Encounter: 10/10/2022  [facility] Cardiologist: Nanetta Batty, MD   Subjective   Patient without complaint this morning. Reports that she is feeling well and is anxious to go home as soon as INR allows her to do so.   Inpatient Medications    Scheduled Meds:  allopurinol  300 mg Oral QPM   buPROPion  150 mg Oral q AM   empagliflozin  10 mg Oral Daily   feeding supplement  237 mL Oral BID BM   hydrALAZINE  50 mg Oral Q8H   icosapent Ethyl  1 g Oral BID   levothyroxine  75 mcg Oral Q0600   memantine  5 mg Oral q morning   metoprolol succinate  50 mg Oral BID   ondansetron  4 mg Oral Once   pantoprazole  80 mg Oral Q1200   sodium chloride flush  3 mL Intravenous Q12H   sodium chloride flush  3 mL Intravenous Q12H   spironolactone  25 mg Oral Daily   Warfarin - Pharmacist Dosing Inpatient   Does not apply q1600   Continuous Infusions:  sodium chloride     heparin 900 Units/hr (10/10/22 0035)   PRN Meds: sodium chloride, acetaminophen, ALPRAZolam, guaiFENesin-dextromethorphan, ondansetron (ZOFRAN) IV, sodium chloride flush   Vital Signs    Vitals:   10/09/22 1419 10/09/22 2046 10/09/22 2208 10/10/22 0308  BP: (!) 142/61 (!) 145/67 (!) 152/60 (!) 153/74  Pulse: 74 73 84 76  Resp: 17 18  20   Temp: 97.9 F (36.6 C) 98.4 F (36.9 C)  98.2 F (36.8 C)  TempSrc: Oral Oral  Oral  SpO2: 96% 98%  96%  Weight:    41.1 kg  Height:        Intake/Output Summary (Last 24 hours) at 10/10/2022 1000 Last data filed at 10/09/2022 2210 Gross per 24 hour  Intake 168.2 ml  Output 240 ml  Net -71.8 ml      10/10/2022    3:08 AM 10/09/2022    3:20 AM 10/07/2022    4:00 AM  Last 3 Weights  Weight (lbs) 90 lb 9.6 oz 90 lb 3.2 oz 94 lb 9.2 oz  Weight (kg) 41.096 kg 40.914 kg 42.9 kg      Telemetry    AV paced - Personally Reviewed  ECG    No new tracing - Personally Reviewed  Physical Exam   GEN: No  acute distress.   Neck: No JVD Cardiac: RRR, no murmurs, rubs, or gallops. Crisp mechanical valve sound   Respiratory: Clear to auscultation bilaterally. GI: Soft, nontender, non-distended  MS: No edema; No deformity. Neuro:  Nonfocal  Psych: Normal affect   Labs    High Sensitivity Troponin:  No results for input(s): "TROPONINIHS" in the last 720 hours.   Chemistry Recent Labs  Lab 10/06/22 0321 10/07/22 0259 10/08/22 0402 10/08/22 1116 10/08/22 1128 10/09/22 0623 10/10/22 0223  NA 134* 129* 131*   < > 131* 131* 130*  K 5.0 4.0 4.3   < > 4.4 4.5 4.8  CL 96* 92* 92*  --   --  95* 97*  CO2 25 26 29   --   --  24 22  GLUCOSE 90 139* 89  --   --  92 124*  BUN 29* 33* 32*  --   --  32* 34*  CREATININE 1.40* 1.66* 1.60*  --   --  1.73* 1.47*  CALCIUM 9.2  9.1 8.7*  --   --  8.7* 9.0  MG 2.3 1.7 2.2  --   --   --   --   PROT 6.4* 6.8 6.3*  --   --  5.9*  --   ALBUMIN 2.9* 2.9* 2.8*  --   --  2.8*  --   AST 117* 80* 52*  --   --  38  --   ALT 232* 177* 121*  --   --  85*  --   ALKPHOS 124 124 106  --   --  100  --   BILITOT 0.7 0.7 0.5  --   --  0.7  --   GFRNONAA 38* 31* 32*  --   --  30* 36*  ANIONGAP 13 11 10   --   --  12 11   < > = values in this interval not displayed.    Lipids No results for input(s): "CHOL", "TRIG", "HDL", "LABVLDL", "LDLCALC", "CHOLHDL" in the last 168 hours.  Hematology Recent Labs  Lab 10/08/22 0402 10/08/22 1116 10/08/22 1128 10/09/22 0623 10/09/22 1128 10/10/22 0223  WBC 7.2  --   --  7.1  --  8.7  RBC 3.52*  --   --  3.16* 3.31* 3.33*  HGB 10.8*   < > 10.5* 9.7*  --  10.4*  HCT 33.5*   < > 31.0* 31.1*  --  31.9*  MCV 95.2  --   --  98.4  --  95.8  MCH 30.7  --   --  30.7  --  31.2  MCHC 32.2  --   --  31.2  --  32.6  RDW 16.5*  --   --  16.5*  --  16.4*  PLT 248  --   --  205  --  220   < > = values in this interval not displayed.   Thyroid  Recent Labs  Lab 10/09/22 0623  TSH 2.151  FREET4 1.22*    BNP Recent Labs  Lab  10/07/22 0259 10/08/22 0402 10/09/22 0623  BNP 1,164.3* 1,156.1* 927.0*    DDimer No results for input(s): "DDIMER" in the last 168 hours.   Radiology    CARDIAC CATHETERIZATION  Result Date: 10/08/2022   Prox RCA lesion is 20% stenosed.   Prox RCA to Mid RCA lesion is 30% stenosed.   Mid LM to Dist LM lesion is 20% stenosed.   Mid LAD lesion is 20% stenosed.   Mid LAD to Dist LAD lesion is 50% stenosed.   Mid Cx to Dist Cx lesion is 30% stenosed. Mild nonobstructive CAD with 20% smooth taper with mild calcification in the distal left main; 20% proximal and 50% mid LAD stenosis; large normal high OM/ramus like vessel; 30% smooth narrowing in the mid circumflex; and dominant RCA with 20 and 30% proximal to mid stenosis. Very mild elevation of right heart pressure with PA systolic at 32.  Mean PA pressure 23. Mechanical mitral valve with normal excursion. LVEDP 24 mm. RECOMMENDATION: Guideline directed medical therapy for HFrEF.  Medical therapy for nonobstructive CAD.  Resumption of warfarin tonight or tomorrow with heparinization with mechanical MVR until therapeutic.    Cardiac Studies   10/08/22 LHC/RHC     Prox RCA lesion is 20% stenosed.   Prox RCA to Mid RCA lesion is 30% stenosed.   Mid LM to Dist LM lesion is 20% stenosed.   Mid LAD lesion is 20% stenosed.   Mid LAD to [facility]  LAD lesion is 50% stenosed.   Mid Cx to Dist Cx lesion is 30% stenosed.   Mild nonobstructive CAD with 20% smooth taper with mild calcification in the distal left main; 20% proximal and 50% mid LAD stenosis; large normal high OM/ramus like vessel; 30% smooth narrowing in the mid circumflex; and dominant RCA with 20 and 30% proximal to mid stenosis.   Very mild elevation of right heart pressure with PA systolic at 32.  Mean PA pressure 23.   Mechanical mitral valve with normal excursion.   LVEDP 24 mm.   RECOMMENDATION: Guideline directed medical therapy for HFrEF.  Medical therapy for nonobstructive CAD.   Resumption of warfarin tonight or tomorrow with heparinization with mechanical MVR until therapeutic.   Right Atrium RA: A-wave 3, V wave 5; mean 3 RV: 31/2 PA: 32/13; mean 23 PW: A-wave 14; V wave 23; mean 15  Ao: 133/48 LV: 130/24  Oxygen saturation in the PA 57% and AO 94%  By the Fick method, cardiac output 3.4 L/min and cardiac index 2.6 L/min/m.  PVR: 2.4 WU    Diagnostic Dominance: Right   Patient Profile     82 y.o. female with mechanical mitral valve (repaired 2002, replacement 2007), history of systolic heart failure s/p [employer] biventricular ICD with initial improvement in LVEF but recent decline, hypertension, hyperlipidemia, CKD stage III, PAF, hypothyroidism, COPD, low BMI who presented to [facility] with abdominal discomfort and elevated LFTs felt possibly related to acute systolic CHF.   Assessment & Plan    Acute on chronic HFrEF, h/o CRT-D   EF had beeen as low as 10% in the past, improved to 45-50% post CRT-D with recent decline 09/2022 with EF to 20-25%. LHC/RHC on 5/8 revealed mild non-obstructive CAD. LVEDP 24mm. CO by Fick 3.4, CI 2.6.   Clinically she appears to be remaining euvolemic, no loop diuretics. Although creatinine is improved today 173->1.47, will continue to defer initiation of ARB or ARNI. If creatinine returns to baseline, could consider renal dose Entresto prior to discharge. Continue Jardiance Continue Spironolactone 25mg  Hydralazine 50mg  Q8hr for afterload reduction  H/o MV repair 2002, replacement 2007   Echo as above with normal function/structure, also with mild AI, moderate TR.   INR 1.3 this AM. On Coumadin PTA, on heparin per pharmacy   Essential HTN   SBP remains slightly elevated. Continue Toprol XL 50mg  QD and hydralazine 50mg  Q8hr.   Hyperlipidemia    Lipid panel in 09/2022: Total Cholesterol 273, Triglycerides 383, HDL 34, LDL 165.    Intolerant to high-intensity statins. On Pravastatin 40mg  daily at home but currently on  hold given elevated LFTs. Continue Vascepa - prior notes indicate 2g BID but on 1g BID presently   Elevated LFTs   Thought to be due to hepatic congestion, improving.   Non obstructive CAD   LHC with non-obstructive CAD. Medical management.    AKI on CKD 3b, hyponatremia   Baseline appears to be around 1.3-1.4, 1.60->1.73 today.   PAF   AV paced on telemetry. Warfarin per pharmacy.        For questions or updates, please contact [facility] Please consult [email or web address] for contact info under        Signed, Perlie Gold, PA-C  10/10/2022, 10:00 AM

## 2022-10-10 NOTE — Progress Notes (Signed)
Heart Failure Stewardship Pharmacist Progress Note   PCP: Garlan Fillers, MD PCP-Cardiologist: Nanetta Batty, MD   HPI:  82 YO female with a PMH of mechanical mitral valve on warfarin, biventricular PPM/ICD, chronic diastolic heart failure, paroxysmal atrial fibrillation, COPD,HTN, HLD, CKD3a   Patient presented to [facility] ED on 5/3 with complaints of abdominal pain. She also reports dyspnea this week requiring up to 3 pillows when she sleeps and felt her legs have been more edematous. She takes 20 mg of Lasix daily and missed just 1 dose the night prior to admission. CXR also showing vascular congestion. Recently her LVEF had declined further down to 20 to 25% after initially improving to 45 to 50% after CRT-D therapy in 2021. BNP 3914. Echo from 4/8 showing EF 20-25%, global hypokinesis, normal RV, severely dilated left atrial size, mild AVR, and moderate TVR. R/LHC from today (5/8) showing multiple mild stenoses and 50% stenosed mid LAD-dist LAD; PAP 23 mmHg, LVEDP 24 mmHg, CO 3.41, CI 2.45.    Pt reported an allergy to [place] ~6 months ago described as severe foot swelling that required admission to the ED. After speaking with patient again on 5/10, she reports that the allergy could have been related to Augmentin as opposed to [place].   Current HF Medications: Beta Blocker: metoprolol succinate 50 mg BID MRA: spironolactone 25 mg daily SGLT2i: empagliflozin 10 mg daily  Prior to admission HF Medications: Diuretic: furosemide 20 mg PO daily Beta blocker: metoprolol succinate 50 mg BID MRA: spironolactone 25 mg daily  Pertinent Lab Values: Serum creatinine 1.47, BUN 34, Potassium 4.8, Sodium 130, BNP 927, Magnesium 2.2  Vital Signs: Weight: 90 lbs (admission weight: 92 lbs) Blood pressure: 140s150s/60s  Heart rate: 70s  I/O: -71.66mL yesterday; net -2.1L  Medication Assistance / Insurance Benefits Check: Does the patient have prescription insurance?  Yes Type of insurance  plan: Medicare  Does the patient qualify for medication assistance through manufacturers or grants?   Yes Eligible grants and/or patient assistance programs: None  Medication assistance applications in progress: None currently   Medication assistance applications approved: None Approved medication assistance renewals will be completed by: N/A  Outpatient Pharmacy:  Prior to admission outpatient pharmacy: [facility] Is the patient willing to use [facility] pharmacy at discharge? Yes Is the patient willing to transition their outpatient pharmacy to utilize a [facility] outpatient pharmacy? No   Assessment: 1. Acute on chronic systolic CHF (LVEF 20-25%), due to NICM. NYHA class IV symptoms. - Scr 1.73>>1.47 from yesterday after adding Jardiance and spironolactone. Strict I's and O's. Keep K >4 and Mg >2.  - Continue spironolactone and monitor Scr closely  - Continue Jardiance 10 mg daily and continue to monitor for swelling (reported allergy to [place] but more likely due to Augmentin after disucssion with patient)--patient reports no issues at this time - Recommend continuing to hold furosemide given variable Scr and euvolemic appearance upon examination - Continue Toprol-XL 50mg  BID - Consider adding Entresto if patient's Scr continues to improve and BP remains stable - Agree with adding hydralazine 50 mg TID for further BP control while awaiting further renal improvement   Plan: 1) Medication changes recommended at this time: - Recommend continuing to hold furosemide - Consider adding Entresto if patient's Scr continues to improve and BP remains stable  - Start hydralazine 50 mg TID   2) Patient assistance: - None  3)  Education  - Patient has been educated on current HF medications and potential  additions to HF medication regimen - Patient verbalizes understanding that over the next few months, these medication doses may change and more medications may be added to optimize HF  regimen - Patient has been educated on basic disease state pathophysiology and goals of therapy   [name], PharmD PGY1 Pharmacy Resident 5/10/20249:22 AM

## 2022-10-10 NOTE — Progress Notes (Signed)
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 -Initially treated with Mucomyst infusion given questionable concern for Tylenol toxicity -Treated for CHF exacerbation, primarily suspected to have congestive hepatopathy  Subjective: -feels well, no complaints  Assessment and Plan:  Acute on chronic combined systolic and diastolic CHF -admitted with orthopnea PND and swelling -Last echo 07/2022 with EF of 20 to 25%, normal RV, mitral valve unremarkable -Has been diuresed with IV Lasix, she is down 9 pounds, clinically appears euvolemic -Right and left heart cath 5/8 with mild nonobstructive CAD, mild elevation of right heart pressures -Cards following, continue Jardiance and Aldactone -discharge planning, home when INR therapeutic  Abnormal LFTs -Secondary to passive hepatic congestion, in the background of liver cirrhosis, no obstructive pathology -Was also initially treated for Tylenol toxicity -undetectable Tylenol level  Suspected cirrhosis -?  Cardiac cirrhosis versus NAFLD -Diuretics as above, add Aldactone  Normocytic anemia -anemia panel w/ Iron defi, add IV iron   COPD (chronic obstructive pulmonary disease) (HCC) - Stable and not in exacerbation - Continue home bronchodilator  Mixed hyperlipidemia -continue Vascepa -holding statin   Hypertension - Continue beta-blocker and spironolactone  Hypothyroidism - Continue levothyroxine  Paroxysmal atrial fibrillation (HCC) - Continue beta-blocker  History of mitral valve replacement with mechanical valve -INR is subtherapeutic, continue warfarin/heparin  Biventricular ICD (implantable cardioverter-defibrillator) in place Noted  Mood  disorder -Stable, continue Wellbutrin and Xanax per home regimen   DVT prophylaxis:heparin/warfarin Code Status: Full Code Family Communication: None present Disposition Plan:   Consultants:    Procedures:   Antimicrobials:    Objective: Vitals:   10/10/22 0308 10/10/22 1014 10/10/22 1437 10/10/22 1557  BP: (!) 153/74 (!) 143/66  (!) 135/58  Pulse: 76 74 74   Resp: 20  18   Temp: 98.2 F (36.8 C)  98 F (36.7 C)   TempSrc: Oral  Oral   SpO2: 96%     Weight: 41.1 kg     Height:        Intake/Output Summary (Last 24 hours) at 10/10/2022 1609 Last data filed at 10/10/2022 0800 Gross per 24 hour  Intake 408.2 ml  Output 240 ml  Net 168.2 ml   Filed Weights   10/07/22 0400 10/09/22 0320 10/10/22 0308  Weight: 42.9 kg 40.9 kg 41.1 kg    Examination:  General exam: Appears calm and comfortable  Respiratory system: Clear to auscultation Cardiovascular system: S1 & S2 heard, RRR. Metallic click Abd: nondistended, soft and nontender.Normal bowel sounds heard. Central nervous system: Alert and oriented. No focal neurological deficits. Extremities: no edema Skin: No rashes Psychiatry:  Mood & affect appropriate.     Data Reviewed:   CBC: Recent Labs  Lab 10/05/22 0318 10/06/22 0321 10/07/22 0259 10/08/22 0402 10/08/22 1116 10/08/22 1128 10/09/22 0623 10/10/22 0223  WBC 8.2 8.8 9.5 7.2  --   --  7.1 8.7  NEUTROABS 6.2 6.6 7.6 5.2  --   --  5.4  --   HGB 10.6* 10.9* 11.5* 10.8* 10.9*  10.9* 10.5* 9.7* 10.4*  HCT 32.3* 33.3* 34.4* 33.5* 32.0*  32.0* 31.0* 31.1* 31.9*  MCV 92.3 94.9 92.2 95.2  --   --  98.4 95.8  PLT 276 301 274 248  --   --  205 220  Basic Metabolic Panel: Recent Labs  Lab 10/05/22 0254 10/06/22 0321 10/07/22 0259 10/08/22 0402 10/08/22 1116 10/08/22 1128 10/09/22 0623 10/10/22 0223  NA 133* 134* 129* 131* 134*  135 131* 131* 130*  K 3.1* 5.0 4.0 4.3 4.6  4.5 4.4 4.5 4.8  CL 98 96* 92* 92*  --   --  95* 97*  CO2 25 25 26  29   --   --  24 22  GLUCOSE 83 90 139* 89  --   --  92 124*  BUN 27* 29* 33* 32*  --   --  32* 34*  CREATININE 1.26* 1.40* 1.66* 1.60*  --   --  1.73* 1.47*  CALCIUM 8.6* 9.2 9.1 8.7*  --   --  8.7* 9.0  MG 1.4* 2.3 1.7 2.2  --   --   --   --    GFR: Estimated Creatinine Clearance: 19.8 mL/min (A) (by C-G formula based on SCr of 1.47 mg/dL (H)). Liver Function Tests: Recent Labs  Lab 10/05/22 0254 10/06/22 0321 10/07/22 0259 10/08/22 0402 10/09/22 0623  AST 167* 117* 80* 52* 38  ALT 283* 232* 177* 121* 85*  ALKPHOS 135* 124 124 106 100  BILITOT 0.8 0.7 0.7 0.5 0.7  PROT 5.9* 6.4* 6.8 6.3* 5.9*  ALBUMIN 2.7* 2.9* 2.9* 2.8* 2.8*   No results for input(s): "LIPASE", "AMYLASE" in the last 168 hours.  No results for input(s): "AMMONIA" in the last 168 hours. Coagulation Profile: Recent Labs  Lab 10/06/22 0321 10/07/22 0259 10/08/22 0402 10/09/22 0623 10/10/22 0223  INR 1.9* 1.5* 1.3* 1.3* 1.4*   Cardiac Enzymes: No results for input(s): "CKTOTAL", "CKMB", "CKMBINDEX", "TROPONINI" in the last 168 hours. BNP (last 3 results) No results for input(s): "PROBNP" in the last 8760 hours. HbA1C: No results for input(s): "HGBA1C" in the last 72 hours. CBG: No results for input(s): "GLUCAP" in the last 168 hours. Lipid Profile: No results for input(s): "CHOL", "HDL", "LDLCALC", "TRIG", "CHOLHDL", "LDLDIRECT" in the last 72 hours. Thyroid Function Tests: Recent Labs    10/09/22 0623  TSH 2.151  FREET4 1.22*   Anemia Panel: Recent Labs    10/09/22 1128  VITAMINB12 1,276*  FOLATE 26.9  FERRITIN 293  TIBC 305  IRON 43  RETICCTPCT 4.2*   Urine analysis:    Component Value Date/Time   COLORURINE STRAW (A) 10/04/2022 0603   APPEARANCEUR CLEAR 10/04/2022 0603   LABSPEC 1.006 10/04/2022 0603   PHURINE 5.0 10/04/2022 0603   GLUCOSEU NEGATIVE 10/04/2022 0603   HGBUR MODERATE (A) 10/04/2022 0603   BILIRUBINUR NEGATIVE 10/04/2022 0603   KETONESUR NEGATIVE 10/04/2022 0603    PROTEINUR 100 (A) 10/04/2022 0603   NITRITE NEGATIVE 10/04/2022 0603   LEUKOCYTESUR NEGATIVE 10/04/2022 0603   Sepsis Labs: @LABRCNTIP (procalcitonin:4,lacticidven:4)  )No results found for this or any previous visit (from the past 240 hour(s)).   Radiology Studies: No results found.   Scheduled Meds:  allopurinol  300 mg Oral QPM   buPROPion  150 mg Oral q AM   empagliflozin  10 mg Oral Daily   feeding supplement  237 mL Oral BID BM   [START ON 10/11/2022] furosemide  20 mg Oral Daily   hydrALAZINE  50 mg Oral Q8H   icosapent Ethyl  1 g Oral BID   levothyroxine  75 mcg Oral Q0600   memantine  5 mg Oral q morning   metoprolol succinate  50 mg Oral BID   ondansetron  4 mg Oral Once   pantoprazole  80 mg Oral Q1200   sodium chloride flush  3 mL Intravenous Q12H   sodium chloride flush  3 mL Intravenous Q12H   spironolactone  25 mg Oral Daily   Warfarin - Pharmacist Dosing Inpatient   Does not apply q1600   Continuous Infusions:  sodium chloride     heparin 900 Units/hr (10/10/22 0035)     LOS: 6 days    Time spent:    Zannie Cove, MD [facility]   10/10/2022, 4:09 PM

## 2022-10-10 NOTE — Progress Notes (Signed)
ANTICOAGULATION CONSULT NOTE  Pharmacy Consult for Heparin/coumadin Indication:  mechanical valve  Allergies  Allergen Reactions   Ace Inhibitors Cough   Lipitor [Atorvastatin Calcium] Other (See Comments)    Knee pain    Megace [Megestrol] Other (See Comments)    High blood pressure and blurred vision   Aspirin Other (See Comments)    Nephrologist said to not take this Update 10/07/22 - patient clarifies this recommendation was lumped in with a whole bunch of other medications at that time many years ago when nephrologist told her to stay away from Tylenol, vitamins, ASA. Denies prior allergy or allergic reaction to this   Clorazepate Dipotassium Other (See Comments)    Interacts with a drug being taken   Simvastatin Other (See Comments)    Muscle pain   Tiotropium Bromide Monohydrate Other (See Comments)    Dry mouth   Codeine Nausea And Vomiting   Tape Rash   Patient Measurements: Height: 5\' 2"  (157.5 cm) Weight: 41.1 kg (90 lb 9.6 oz) IBW/kg (Calculated) : 50.1  Vital Signs: Temp: 98.2 F (36.8 C) (05/10 0308) Temp Source: Oral (05/10 0308) BP: 143/66 (05/10 1014) Pulse Rate: 74 (05/10 1014)  Labs: Recent Labs    10/08/22 0402 10/08/22 1116 10/08/22 1128 10/08/22 1539 10/09/22 0623 10/10/22 0223  HGB 10.8*   < > 10.5*  --  9.7* 10.4*  HCT 33.5*   < > 31.0*  --  31.1* 31.9*  PLT 248  --   --   --  205 220  LABPROT 16.8*  --   --   --  16.3* 17.5*  INR 1.3*  --   --   --  1.3* 1.4*  HEPARINUNFRC 0.68  --   --  0.13* 0.30 0.68  CREATININE 1.60*  --   --   --  1.73* 1.47*   < > = values in this interval not displayed.    Estimated Creatinine Clearance: 19.8 mL/min (A) (by C-G formula based on SCr of 1.47 mg/dL (H)).  Medical History:  Assessment: 31 yof with a history of mechanical mitral valve and paroxysmal atrial fibrillation on warfarin. Home dose is 5 mg (2.5 mg x 2) every Sun; 2.5 mg (2.5 mg x 1) all other days. Pharmacy consulted to transition to  heparin when INR < 2.5 to allow for cardiac cath this week. INR now < 2.5.  Heparin level therapeutic this AM. No issues with heparin infusion or signs of bleeding reported.   INR still low this AM.  Coumadin re-initiated 5/8  Goal of Therapy:  Heparin level 0.3 - 0.7 INR 2.5-3.5 Monitor platelets by anticoagulation protocol: Yes   Plan:  Continue IV heparin at 900 units/hr Coumadin 7.5 mg po x 1 tonight. Daily heparin level, INR and CBC.  Reece Leader, Colon Flattery, BCCP Clinical Pharmacist  10/10/2022 11:10 AM   [facility] pharmacy phone numbers are listed on [email or web address]

## 2022-10-11 DIAGNOSIS — R7989 Other specified abnormal findings of blood chemistry: Secondary | ICD-10-CM | POA: Diagnosis not present

## 2022-10-11 DIAGNOSIS — I5023 Acute on chronic systolic (congestive) heart failure: Secondary | ICD-10-CM

## 2022-10-11 DIAGNOSIS — E785 Hyperlipidemia, unspecified: Secondary | ICD-10-CM | POA: Diagnosis not present

## 2022-10-11 LAB — CBC
HCT: 32.5 % — ABNORMAL LOW (ref 36.0–46.0)
Hemoglobin: 10.3 g/dL — ABNORMAL LOW (ref 12.0–15.0)
MCH: 30.8 pg (ref 26.0–34.0)
MCHC: 31.7 g/dL (ref 30.0–36.0)
MCV: 97.3 fL (ref 80.0–100.0)
Platelets: 227 10*3/uL (ref 150–400)
RBC: 3.34 MIL/uL — ABNORMAL LOW (ref 3.87–5.11)
RDW: 16.6 % — ABNORMAL HIGH (ref 11.5–15.5)
WBC: 7.8 10*3/uL (ref 4.0–10.5)
nRBC: 0 % (ref 0.0–0.2)

## 2022-10-11 LAB — BASIC METABOLIC PANEL
Anion gap: 9 (ref 5–15)
BUN: 25 mg/dL — ABNORMAL HIGH (ref 8–23)
CO2: 26 mmol/L (ref 22–32)
Calcium: 9.5 mg/dL (ref 8.9–10.3)
Chloride: 99 mmol/L (ref 98–111)
Creatinine, Ser: 1.64 mg/dL — ABNORMAL HIGH (ref 0.44–1.00)
GFR, Estimated: 31 mL/min — ABNORMAL LOW (ref >60)
Glucose, Bld: 111 mg/dL — ABNORMAL HIGH (ref 70–99)
Potassium: 4.9 mmol/L (ref 3.5–5.1)
Sodium: 134 mmol/L — ABNORMAL LOW (ref 135–145)

## 2022-10-11 LAB — PROTIME-INR
INR: 1.8 — ABNORMAL HIGH (ref 0.8–1.2)
Prothrombin Time: 20.9 seconds — ABNORMAL HIGH (ref 11.4–15.2)

## 2022-10-11 LAB — HEPARIN LEVEL (UNFRACTIONATED)
Heparin Unfractionated: 0.75 IU/mL — ABNORMAL HIGH (ref 0.30–0.70)
Heparin Unfractionated: 0.54 IU/mL (ref 0.30–0.70)

## 2022-10-11 LAB — LIPOPROTEIN A (LPA): Lipoprotein (a): 130.5 nmol/L — ABNORMAL HIGH (ref <75.0)

## 2022-10-11 MED ORDER — WARFARIN SODIUM 5 MG PO TABS
5.0000 mg | ORAL_TABLET | Freq: Once | ORAL | Status: AC
  Administered 2022-10-11: 5 mg via ORAL
  Filled 2022-10-11: qty 1

## 2022-10-11 MED ORDER — ALLOPURINOL 100 MG PO TABS
200.0000 mg | ORAL_TABLET | Freq: Every evening | ORAL | Status: DC
  Administered 2022-10-11 – 2022-10-13 (×3): 200 mg via ORAL
  Filled 2022-10-11 (×3): qty 2

## 2022-10-11 MED ORDER — SACUBITRIL-VALSARTAN 24-26 MG PO TABS
1.0000 | ORAL_TABLET | Freq: Two times a day (BID) | ORAL | Status: DC
  Administered 2022-10-11 (×2): 1 via ORAL
  Filled 2022-10-11 (×2): qty 1

## 2022-10-11 NOTE — Progress Notes (Signed)
Rounding Note    Patient Name: [name] Date of Encounter: 10/11/2022  [facility] Cardiologist: Nanetta Batty, MD   Subjective   She's doing well this AM, Was able to walk in the hallway without any issues. Feels closer to her normal self  Inpatient Medications    Scheduled Meds:  allopurinol  200 mg Oral QPM   buPROPion  150 mg Oral q AM   empagliflozin  10 mg Oral Daily   feeding supplement  237 mL Oral BID BM   ferrous sulfate  325 mg Oral Q breakfast   furosemide  20 mg Oral Daily   hydrALAZINE  50 mg Oral Q8H   icosapent Ethyl  1 g Oral BID   levothyroxine  75 mcg Oral Q0600   memantine  5 mg Oral q morning   metoprolol succinate  50 mg Oral BID   ondansetron  4 mg Oral Once   pantoprazole  80 mg Oral Q1200   sodium chloride flush  3 mL Intravenous Q12H   sodium chloride flush  3 mL Intravenous Q12H   spironolactone  25 mg Oral Daily   warfarin  5 mg Oral ONCE-1600   Warfarin - Pharmacist Dosing Inpatient   Does not apply q1600   Continuous Infusions:  sodium chloride     heparin 850 Units/hr (10/11/22 0914)   PRN Meds: sodium chloride, acetaminophen, ALPRAZolam, guaiFENesin-dextromethorphan, ondansetron (ZOFRAN) IV, sodium chloride flush, traZODone   Vital Signs    Vitals:   10/10/22 2044 10/10/22 2235 10/11/22 0637 10/11/22 0856  BP: (!) 147/75 (!) 144/66 (!) 131/49 129/63  Pulse: 79 77  83  Resp: 18     Temp: 97.6 F (36.4 C)  98.1 F (36.7 C)   TempSrc: Oral     SpO2: 95%     Weight:   39.9 kg   Height:       No intake or output data in the 24 hours ending 10/11/22 1334     10/11/2022    6:37 AM 10/10/2022    3:08 AM 10/09/2022    3:20 AM  Last 3 Weights  Weight (lbs) 87 lb 14.4 oz 90 lb 9.6 oz 90 lb 3.2 oz  Weight (kg) 39.871 kg 41.096 kg 40.914 kg      Telemetry  A sensed V paced - Personally Reviewed  ECG    No new tracing - Personally Reviewed  Physical Exam   Vitals:   10/11/22 0856 10/11/22 1343  BP:  129/63 (!) 133/50  Pulse: 83 71  Resp:  16  Temp:  97.7 F (36.5 C)  SpO2:  97%    GEN: No acute distress.  Sitting in a chair Neck: No JVD Cardiac: RRR, no murmurs, rubs, or gallops. Crisp mechanical valve sound Respiratory: Clear to auscultation bilaterally. GI: Soft, nontender, non-distended  MS: No edema; No deformity. Neuro:  Nonfocal  Psych: Normal affect   Labs    High Sensitivity Troponin:  No results for input(s): "TROPONINIHS" in the last 720 hours.   Chemistry Recent Labs  Lab 10/06/22 0321 10/07/22 0259 10/08/22 0402 10/08/22 1116 10/09/22 0623 10/10/22 0223 10/11/22 0211  NA 134* 129* 131*   < > 131* 130* 134*  K 5.0 4.0 4.3   < > 4.5 4.8 4.9  CL 96* 92* 92*  --  95* 97* 99  CO2 25 26 29   --  24 22 26   GLUCOSE 90 139* 89  --  92 124* 111*  BUN 29*  33* 32*  --  32* 34* 25*  CREATININE 1.40* 1.66* 1.60*  --  1.73* 1.47* 1.64*  CALCIUM 9.2 9.1 8.7*  --  8.7* 9.0 9.5  MG 2.3 1.7 2.2  --   --   --   --   PROT 6.4* 6.8 6.3*  --  5.9*  --   --   ALBUMIN 2.9* 2.9* 2.8*  --  2.8*  --   --   AST 117* 80* 52*  --  38  --   --   ALT 232* 177* 121*  --  85*  --   --   ALKPHOS 124 124 106  --  100  --   --   BILITOT 0.7 0.7 0.5  --  0.7  --   --   GFRNONAA 38* 31* 32*  --  30* 36* 31*  ANIONGAP 13 11 10   --  12 11 9    < > = values in this interval not displayed.    Lipids No results for input(s): "CHOL", "TRIG", "HDL", "LABVLDL", "LDLCALC", "CHOLHDL" in the last 168 hours.  Hematology Recent Labs  Lab 10/09/22 0623 10/09/22 1128 10/10/22 0223 10/11/22 0211  WBC 7.1  --  8.7 7.8  RBC 3.16* 3.31* 3.33* 3.34*  HGB 9.7*  --  10.4* 10.3*  HCT 31.1*  --  31.9* 32.5*  MCV 98.4  --  95.8 97.3  MCH 30.7  --  31.2 30.8  MCHC 31.2  --  32.6 31.7  RDW 16.5*  --  16.4* 16.6*  PLT 205  --  220 227   Thyroid  Recent Labs  Lab 10/09/22 0623  TSH 2.151  FREET4 1.22*    BNP Recent Labs  Lab 10/07/22 0259 10/08/22 0402 10/09/22 0623  BNP 1,164.3* 1,156.1*  927.0*    DDimer No results for input(s): "DDIMER" in the last 168 hours.   Radiology    No results found.  Cardiac Studies   10/08/22 LHC/RHC    Prox RCA lesion is 20% stenosed.   Prox RCA to Mid RCA lesion is 30% stenosed.   Mid LM to Dist LM lesion is 20% stenosed.   Mid LAD lesion is 20% stenosed.   Mid LAD to Dist LAD lesion is 50% stenosed.   Mid Cx to Dist Cx lesion is 30% stenosed.   Mild nonobstructive CAD with 20% smooth taper with mild calcification in the distal left main; 20% proximal and 50% mid LAD stenosis; large normal high OM/ramus like vessel; 30% smooth narrowing in the mid circumflex; and dominant RCA with 20 and 30% proximal to mid stenosis.   Very mild elevation of right heart pressure with PA systolic at 32.  Mean PA pressure 23.   Mechanical mitral valve with normal excursion.   LVEDP 24 mm.   RECOMMENDATION: Guideline directed medical therapy for HFrEF.  Medical therapy for nonobstructive CAD.  Resumption of warfarin tonight or tomorrow with heparinization with mechanical MVR until therapeutic.  Right Atrium RA: A-wave 3, V wave 5; mean 3 RV: 31/2 PA: 32/13; mean 23 PW: A-wave 14; V wave 23; mean 15  Ao: 133/48 LV: 130/24  Oxygen saturation in the PA 57% and AO 94%  By the Fick method, cardiac output 3.4 L/min and cardiac index 2.6 L/min/m.  PVR: 2.4 WU   Diagnostic Dominance: Right   Patient Profile     82 y.o. female with mechanical mitral valve (repaired 2002, replacement 2007), history of systolic heart failure s/p [employer] biventricular ICD  with initial improvement in LVEF but recent decline, hypertension, hyperlipidemia, CKD stage III, PAF, hypothyroidism, COPD, low BMI who presented to [facility] with abdominal discomfort and elevated LFTs felt possibly related to acute systolic CHF.   Assessment & Plan    Acute on chronic HFrEF, h/o CRT-D EF had beeen as low as 10% in the past, improved to 45-50% post CRT-D with recent decline 09/2022  with EF to 20-25%. LHC/RHC on 5/8 revealed mild non-obstructive CAD. LVEDP 24mm. CO by Fick 3.4, CI 2.6. - euvolemic today - continue lasix 20 mg daily - start entresto 24-26 mg BID today - continue metop XL 50 mg scheduled as BID - continue spironolactone 25 mg daily - continue jardiance 10 mg daily    H/o MV repair 2002, replacement 2007  Echo as above with normal function/structure, also with mild AI, moderate TR. -INR 1.3 this AM. On Coumadin PTA, on heparin per pharmacy   Essential HTN  - continue hydralazine 50 mg TID, if BP too low, can scale back   Hyperlipidemia   Lipid panel in 09/2022: Total Cholesterol 273, Triglycerides 383, HDL 34, LDL 165.  -Intolerant to high-intensity statins. On Pravastatin 40mg  daily at home but currently on hold given elevated LFTs. -Continue Vascepa - prior notes indicate 2g BID but on 1g BID presently   Elevated LFTs  Thought to be due to hepatic congestion, improving.   Lab Results  Component Value Date   ALT 85 (H) 10/09/2022   AST 38 10/09/2022   ALKPHOS 100 10/09/2022   BILITOT 0.7 10/09/2022   Non obstructive CAD  LHC with non-obstructive CAD. Medical management.    AKI on CKD 3b, hyponatremia  Baseline appears to be around 1.3-1.4, 1.60->1.73 today.   PAF  AV paced on telemetry. Resume Warfarin per pharmacy dosing post cath.    If tolerates GDMT and labs stable in the AM, can be discharged from a cardiology standpoint.  For questions or updates, please contact [facility] Please consult [email or web address] for contact info under        Signed, Maisie Fus, MD  10/11/2022, 1:34 PM

## 2022-10-11 NOTE — Progress Notes (Signed)
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 -Initially treated with Mucomyst infusion given questionable concern for Tylenol toxicity -Treated for CHF exacerbation, primarily suspected to have congestive hepatopathy  Subjective: -feels well, no complaints  Assessment and Plan:  Acute on chronic combined systolic and diastolic CHF -admitted with orthopnea PND and swelling -Last echo 07/2022 with EF of 20 to 25%, normal RV, mitral valve unremarkable -Has been diuresed with IV Lasix, down 12 pounds, clinically appears euvolemic -Right and left heart cath 5/8 with mild nonobstructive CAD, mild elevation of right heart pressures -Cards following, continue Aldactone, Jardiance, Lasix 20 Mg, Toprol, starting Entresto today -discharge planning, home when INR therapeutic, 1.8 today  Abnormal LFTs -Secondary to passive hepatic congestion, in the background of liver cirrhosis, no obstructive pathology -Was also initially treated for Tylenol toxicity -undetectable Tylenol level  Suspected cirrhosis -?  Cardiac cirrhosis versus NAFLD -Continue low 6 and Aldactone  Normocytic anemia -anemia panel w/ Iron defi, given IV iron  COPD (chronic obstructive pulmonary disease) (HCC) - Stable and not in exacerbation - Continue home bronchodilator  Mixed hyperlipidemia -continue Vascepa -holding statin   Hypertension - Continue beta-blocker and spironolactone  Hypothyroidism - Continue levothyroxine  Paroxysmal atrial fibrillation (HCC) - Continue beta-blocker  History of mitral valve replacement with mechanical valve -INR is subtherapeutic, continue warfarin/heparin, 1.8 today  Biventricular ICD (implantable  cardioverter-defibrillator) in place Noted  Mood disorder -Stable, continue Wellbutrin and Xanax per home regimen   DVT prophylaxis:heparin/warfarin Code Status: Full Code Family Communication: None present Disposition Plan: Home tomorrow if stable  Consultants:  Cards  Procedures:   Antimicrobials:    Objective: Vitals:   10/10/22 2235 10/11/22 0637 10/11/22 0856 10/11/22 1343  BP: (!) 144/66 (!) 131/49 129/63 (!) 133/50  Pulse: 77  83 71  Resp:    16  Temp:  98.1 F (36.7 C)  97.7 F (36.5 C)  TempSrc:    Oral  SpO2:    97%  Weight:  39.9 kg    Height:       No intake or output data in the 24 hours ending 10/11/22 1404  Filed Weights   10/09/22 0320 10/10/22 0308 10/11/22 0637  Weight: 40.9 kg 41.1 kg 39.9 kg    Examination:  Gen: Awake, Alert, Oriented X 3,  HEENT: no JVD Lungs: Good air movement bilaterally, CTAB CVS: S1S2/RRR, metallic click Abd: soft, Non tender, non distended, BS present Extremities: No edema Skin: no new rashes on exposed skin     Data Reviewed:   CBC: Recent Labs  Lab 10/05/22 0318 10/06/22 0321 10/07/22 0259 10/08/22 0402 10/08/22 1116 10/08/22 1128 10/09/22 0623 10/10/22 0223 10/11/22 0211  WBC 8.2 8.8 9.5 7.2  --   --  7.1 8.7 7.8  NEUTROABS 6.2 6.6 7.6 5.2  --   --  5.4  --   --   HGB 10.6* 10.9* 11.5* 10.8* 10.9*  10.9* 10.5* 9.7* 10.4* 10.3*  HCT 32.3* 33.3* 34.4* 33.5* 32.0*  32.0* 31.0* 31.1* 31.9* 32.5*  MCV 92.3 94.9 92.2 95.2  --   --  98.4 95.8 97.3  PLT 276 301 274 248  --   --  205 220 227   Basic Metabolic Panel: Recent Labs  Lab 10/05/22 0254 10/06/22 0321 10/07/22  1610 10/08/22 0402 10/08/22 1116 10/08/22 1128 10/09/22 0623 10/10/22 0223 10/11/22 0211  NA 133* 134* 129* 131* 134*  135 131* 131* 130* 134*  K 3.1* 5.0 4.0 4.3 4.6  4.5 4.4 4.5 4.8 4.9  CL 98 96* 92* 92*  --   --  95* 97* 99  CO2 25 25 26 29   --   --  24 22 26   GLUCOSE 83 90 139* 89  --   --  92 124* 111*  BUN 27* 29*  33* 32*  --   --  32* 34* 25*  CREATININE 1.26* 1.40* 1.66* 1.60*  --   --  1.73* 1.47* 1.64*  CALCIUM 8.6* 9.2 9.1 8.7*  --   --  8.7* 9.0 9.5  MG 1.4* 2.3 1.7 2.2  --   --   --   --   --    GFR: Estimated Creatinine Clearance: 17.2 mL/min (A) (by C-G formula based on SCr of 1.64 mg/dL (H)). Liver Function Tests: Recent Labs  Lab 10/05/22 0254 10/06/22 0321 10/07/22 0259 10/08/22 0402 10/09/22 0623  AST 167* 117* 80* 52* 38  ALT 283* 232* 177* 121* 85*  ALKPHOS 135* 124 124 106 100  BILITOT 0.8 0.7 0.7 0.5 0.7  PROT 5.9* 6.4* 6.8 6.3* 5.9*  ALBUMIN 2.7* 2.9* 2.9* 2.8* 2.8*   No results for input(s): "LIPASE", "AMYLASE" in the last 168 hours.  No results for input(s): "AMMONIA" in the last 168 hours. Coagulation Profile: Recent Labs  Lab 10/07/22 0259 10/08/22 0402 10/09/22 0623 10/10/22 0223 10/11/22 0625  INR 1.5* 1.3* 1.3* 1.4* 1.8*   Cardiac Enzymes: No results for input(s): "CKTOTAL", "CKMB", "CKMBINDEX", "TROPONINI" in the last 168 hours. BNP (last 3 results) No results for input(s): "PROBNP" in the last 8760 hours. HbA1C: No results for input(s): "HGBA1C" in the last 72 hours. CBG: No results for input(s): "GLUCAP" in the last 168 hours. Lipid Profile: No results for input(s): "CHOL", "HDL", "LDLCALC", "TRIG", "CHOLHDL", "LDLDIRECT" in the last 72 hours. Thyroid Function Tests: Recent Labs    10/09/22 0623  TSH 2.151  FREET4 1.22*   Anemia Panel: Recent Labs    10/09/22 1128  VITAMINB12 1,276*  FOLATE 26.9  FERRITIN 293  TIBC 305  IRON 43  RETICCTPCT 4.2*   Urine analysis:    Component Value Date/Time   COLORURINE STRAW (A) 10/04/2022 0603   APPEARANCEUR CLEAR 10/04/2022 0603   LABSPEC 1.006 10/04/2022 0603   PHURINE 5.0 10/04/2022 0603   GLUCOSEU NEGATIVE 10/04/2022 0603   HGBUR MODERATE (A) 10/04/2022 0603   BILIRUBINUR NEGATIVE 10/04/2022 0603   KETONESUR NEGATIVE 10/04/2022 0603   PROTEINUR 100 (A) 10/04/2022 0603   NITRITE  NEGATIVE 10/04/2022 0603   LEUKOCYTESUR NEGATIVE 10/04/2022 0603   Sepsis Labs: @LABRCNTIP (procalcitonin:4,lacticidven:4)  )No results found for this or any previous visit (from the past 240 hour(s)).   Radiology Studies: No results found.   Scheduled Meds:  allopurinol  200 mg Oral QPM   buPROPion  150 mg Oral q AM   empagliflozin  10 mg Oral Daily   feeding supplement  237 mL Oral BID BM   ferrous sulfate  325 mg Oral Q breakfast   furosemide  20 mg Oral Daily   hydrALAZINE  50 mg Oral Q8H   icosapent Ethyl  1 g Oral BID   levothyroxine  75 mcg Oral Q0600   memantine  5 mg Oral q morning   metoprolol succinate  50 mg Oral BID   ondansetron  4 mg Oral Once   pantoprazole  80 mg Oral Q1200   sacubitril-valsartan  1 tablet Oral BID   sodium chloride flush  3 mL Intravenous Q12H   sodium chloride flush  3 mL Intravenous Q12H   spironolactone  25 mg Oral Daily   warfarin  5 mg Oral ONCE-1600   Warfarin - Pharmacist Dosing Inpatient   Does not apply q1600   Continuous Infusions:  sodium chloride     heparin 850 Units/hr (10/11/22 0914)     LOS: 7 days    Time spent:    Zannie Cove, MD [facility]   10/11/2022, 2:04 PM

## 2022-10-11 NOTE — Progress Notes (Signed)
ANTICOAGULATION CONSULT NOTE  Pharmacy Consult for Heparin/coumadin Indication:  mechanical valve  Allergies  Allergen Reactions   Ace Inhibitors Cough   Lipitor [Atorvastatin Calcium] Other (See Comments)    Knee pain    Megace [Megestrol] Other (See Comments)    High blood pressure and blurred vision   Aspirin Other (See Comments)    Nephrologist said to not take this Update 10/07/22 - patient clarifies this recommendation was lumped in with a whole bunch of other medications at that time many years ago when nephrologist told her to stay away from Tylenol, vitamins, ASA. Denies prior allergy or allergic reaction to this   Clorazepate Dipotassium Other (See Comments)    Interacts with a drug being taken   Simvastatin Other (See Comments)    Muscle pain   Tiotropium Bromide Monohydrate Other (See Comments)    Dry mouth   Codeine Nausea And Vomiting   Tape Rash   Patient Measurements: Height: 5\' 2"  (157.5 cm) Weight: 39.9 kg (87 lb 14.4 oz) IBW/kg (Calculated) : 50.1  Vital Signs: Temp: 97.7 F (36.5 C) (05/11 1343) Temp Source: Oral (05/11 1343) BP: 127/54 (05/11 1525) Pulse Rate: 71 (05/11 1343)  Labs: Recent Labs    10/09/22 0623 10/10/22 0223 10/11/22 0211 10/11/22 0625 10/11/22 1731  HGB 9.7* 10.4* 10.3*  --   --   HCT 31.1* 31.9* 32.5*  --   --   PLT 205 220 227  --   --   LABPROT 16.3* 17.5*  --  20.9*  --   INR 1.3* 1.4*  --  1.8*  --   HEPARINUNFRC 0.30 0.68 0.75*  --  0.54  CREATININE 1.73* 1.47* 1.64*  --   --     Estimated Creatinine Clearance: 17.2 mL/min (A) (by C-G formula based on SCr of 1.64 mg/dL (H)).  Medical History:  Assessment: 72 yof with a history of mechanical mitral valve and paroxysmal atrial fibrillation on warfarin. Home dose is 5 mg (2.5 mg x 2) every Sun; 2.5 mg (2.5 mg x 1) all other days. Pharmacy consulted to transition to heparin when INR < 2.5 to allow for cardiac cath this week. INR now < 2.5.  Heparin level this evening  came back therapeutic at 0.54, on 850 units/hr. No s/sx of bleeding or infusion issues per RN.    Goal of Therapy:  Heparin level 0.3 - 0.7 INR 2.5-3.5 Monitor platelets by anticoagulation protocol: Yes   Plan:  Continue IV heparin at 850 units/hr Already received coumadin 5 mg po x 1 tonight Daily heparin level, INR and CBC.  Thank you for allowing pharmacy to participate in this patient's care,  Sherron Monday, PharmD, BCCCP Clinical Pharmacist  Phone: [phone] 10/11/2022 6:59 PM  Please check AMION for all [facility] Pharmacy phone numbers After 10:00 PM, call Main Pharmacy [phone]

## 2022-10-11 NOTE — Progress Notes (Signed)
Mobility Specialist Progress Note    10/11/22 1308  Mobility  Activity Ambulated independently in hallway  Level of Assistance Modified independent, requires aide device or extra time  Assistive Device Four wheel walker  Distance Ambulated (ft) 300 ft  Activity Response Tolerated well  Mobility Referral Yes  $Mobility charge 1 Mobility  Mobility Specialist Start Time (ACUTE ONLY) 1255  Mobility Specialist Stop Time (ACUTE ONLY) 1308  Mobility Specialist Time Calculation (min) (ACUTE ONLY) 13 min   Pre-Mobility: 75 HR During Mobility: 78 HR Post-Mobility: 75 HR  Pt received in bed and agreeable. No complaints on walk. Returned to chair with call bell in reach.   [place] Mobility Specialist  Please Neurosurgeon or Rehab Office at [phone]

## 2022-10-11 NOTE — Progress Notes (Signed)
ANTICOAGULATION CONSULT NOTE  Pharmacy Consult for Heparin/coumadin Indication:  mechanical valve  Allergies  Allergen Reactions   Ace Inhibitors Cough   Lipitor [Atorvastatin Calcium] Other (See Comments)    Knee pain    Megace [Megestrol] Other (See Comments)    High blood pressure and blurred vision   Aspirin Other (See Comments)    Nephrologist said to not take this Update 10/07/22 - patient clarifies this recommendation was lumped in with a whole bunch of other medications at that time many years ago when nephrologist told her to stay away from Tylenol, vitamins, ASA. Denies prior allergy or allergic reaction to this   Clorazepate Dipotassium Other (See Comments)    Interacts with a drug being taken   Simvastatin Other (See Comments)    Muscle pain   Tiotropium Bromide Monohydrate Other (See Comments)    Dry mouth   Codeine Nausea And Vomiting   Tape Rash   Patient Measurements: Height: 5\' 2"  (157.5 cm) Weight: 39.9 kg (87 lb 14.4 oz) IBW/kg (Calculated) : 50.1  Vital Signs: Temp: 98.1 F (36.7 C) (05/11 0637) Temp Source: Oral (05/10 2044) BP: 131/49 (05/11 1610) Pulse Rate: 77 (05/10 2235)  Labs: Recent Labs    10/09/22 0623 10/10/22 0223 10/11/22 0211 10/11/22 0625  HGB 9.7* 10.4* 10.3*  --   HCT 31.1* 31.9* 32.5*  --   PLT 205 220 227  --   LABPROT 16.3* 17.5*  --  20.9*  INR 1.3* 1.4*  --  1.8*  HEPARINUNFRC 0.30 0.68 0.75*  --   CREATININE 1.73* 1.47* 1.64*  --     Estimated Creatinine Clearance: 17.2 mL/min (A) (by C-G formula based on SCr of 1.64 mg/dL (H)).  Medical History:  Assessment: 27 yof with a history of mechanical mitral valve and paroxysmal atrial fibrillation on warfarin. Home dose is 5 mg (2.5 mg x 2) every Sun; 2.5 mg (2.5 mg x 1) all other days. Pharmacy consulted to transition to heparin when INR < 2.5 to allow for cardiac cath this week. INR now < 2.5.  Heparin level elevated this AM at 0.75. No issues with heparin infusion or  signs of bleeding reported.   INR 1.8. Coumadin re-initiated 5/8  Goal of Therapy:  Heparin level 0.3 - 0.7 INR 2.5-3.5 Monitor platelets by anticoagulation protocol: Yes   Plan:  Decrease IV heparin to 850 units/hr Coumadin 5 mg po x 1 tonight. Check 8 hour heparin level  Daily heparin level, INR and CBC.  Andreas Ohm, PharmD Pharmacy Resident  10/11/2022 8:33 AM

## 2022-10-12 DIAGNOSIS — E785 Hyperlipidemia, unspecified: Secondary | ICD-10-CM | POA: Diagnosis not present

## 2022-10-12 DIAGNOSIS — R7989 Other specified abnormal findings of blood chemistry: Secondary | ICD-10-CM | POA: Diagnosis not present

## 2022-10-12 DIAGNOSIS — I5023 Acute on chronic systolic (congestive) heart failure: Secondary | ICD-10-CM | POA: Diagnosis not present

## 2022-10-12 LAB — CBC
HCT: 28.6 % — ABNORMAL LOW (ref 36.0–46.0)
Hemoglobin: 9.2 g/dL — ABNORMAL LOW (ref 12.0–15.0)
MCH: 30.6 pg (ref 26.0–34.0)
MCHC: 32.2 g/dL (ref 30.0–36.0)
MCV: 95 fL (ref 80.0–100.0)
Platelets: 189 10*3/uL (ref 150–400)
RBC: 3.01 MIL/uL — ABNORMAL LOW (ref 3.87–5.11)
RDW: 16.4 % — ABNORMAL HIGH (ref 11.5–15.5)
WBC: 8.3 10*3/uL (ref 4.0–10.5)
nRBC: 0 % (ref 0.0–0.2)

## 2022-10-12 LAB — PROTIME-INR
INR: 2.6 — ABNORMAL HIGH (ref 0.8–1.2)
Prothrombin Time: 28.4 seconds — ABNORMAL HIGH (ref 11.4–15.2)

## 2022-10-12 LAB — BASIC METABOLIC PANEL
Anion gap: 10 (ref 5–15)
BUN: 30 mg/dL — ABNORMAL HIGH (ref 8–23)
CO2: 24 mmol/L (ref 22–32)
Calcium: 8.9 mg/dL (ref 8.9–10.3)
Chloride: 96 mmol/L — ABNORMAL LOW (ref 98–111)
Creatinine, Ser: 2.1 mg/dL — ABNORMAL HIGH (ref 0.44–1.00)
GFR, Estimated: 23 mL/min — ABNORMAL LOW (ref >60)
Glucose, Bld: 107 mg/dL — ABNORMAL HIGH (ref 70–99)
Potassium: 4.8 mmol/L (ref 3.5–5.1)
Sodium: 130 mmol/L — ABNORMAL LOW (ref 135–145)

## 2022-10-12 LAB — HEPARIN LEVEL (UNFRACTIONATED): Heparin Unfractionated: 0.48 IU/mL (ref 0.30–0.70)

## 2022-10-12 MED ORDER — HYDRALAZINE HCL 25 MG PO TABS
25.0000 mg | ORAL_TABLET | Freq: Three times a day (TID) | ORAL | Status: DC
  Administered 2022-10-12 – 2022-10-14 (×6): 25 mg via ORAL
  Filled 2022-10-12 (×7): qty 1

## 2022-10-12 MED ORDER — WARFARIN SODIUM 1 MG PO TABS
1.0000 mg | ORAL_TABLET | Freq: Once | ORAL | Status: AC
  Administered 2022-10-12: 1 mg via ORAL
  Filled 2022-10-12: qty 1

## 2022-10-12 MED ORDER — SACUBITRIL-VALSARTAN 24-26 MG PO TABS: 1.0000 | ORAL_TABLET | Freq: Two times a day (BID) | ORAL | Status: DC

## 2022-10-12 NOTE — Progress Notes (Signed)
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 -Initially treated with Mucomyst infusion given questionable concern for Tylenol toxicity -Treated for CHF exacerbation, primarily suspected to have congestive hepatopathy  Subjective: -Feels well, no complaints, ambulating in the halls  Assessment and Plan:  Acute on chronic combined systolic and diastolic CHF -admitted with orthopnea PND and swelling -Last echo 07/2022 with EF of 20 to 25%, normal RV, mitral valve unremarkable -Has been diuresed with IV Lasix, down 12 pounds, clinically appears euvolemic -Right and left heart cath 5/8 with mild nonobstructive CAD, mild elevation of right heart pressures -Cards following, continue Aldactone, Jardiance, Lasix 20 Mg, Toprol, started Entresto yesterday, creatinine up to 2.1 from 1.6, will discuss with cards and likely discontinue [facility] tomorrow if creatinine is stable  Abnormal LFTs -Secondary to passive hepatic congestion, in the background of liver cirrhosis, no obstructive pathology -Was also initially treated for Tylenol toxicity -undetectable Tylenol level -Improving  Suspected cirrhosis -?  Cardiac cirrhosis versus NAFLD -Continue Lasix and Aldactone  Normocytic anemia -anemia panel w/ Iron defi, given IV iron  COPD (chronic obstructive pulmonary disease) (HCC) - Stable and not in exacerbation - Continue home bronchodilator  Mixed hyperlipidemia -continue Vascepa -holding statin   Hypertension - Continue beta-blocker and spironolactone  Hypothyroidism - Continue levothyroxine  Paroxysmal atrial fibrillation (HCC) - Continue beta-blocker  History of mitral valve replacement with mechanical  valve -INR is therapeutic today, discontinue heparin, continue warfarin  Biventricular ICD (implantable cardioverter-defibrillator) in place Noted  Mood disorder -Stable, continue Wellbutrin and Xanax per home regimen   DVT prophylaxis: Warfarin Code Status: Full Code Family Communication: None present Disposition Plan: Home tomorrow if stable  Consultants:  Cards  Procedures:   Antimicrobials:    Objective: Vitals:   10/11/22 2012 10/11/22 2253 10/12/22 0526 10/12/22 0831  BP: (!) 128/56 124/60 (!) 103/50 (!) 108/46  Pulse: 77  70 71  Resp: 14  18   Temp: 98.9 F (37.2 C)  98.2 F (36.8 C)   TempSrc: Oral  Oral   SpO2: 96%  97%   Weight:   40.4 kg   Height:        Intake/Output Summary (Last 24 hours) at 10/12/2022 1014 Last data filed at 10/12/2022 0500 Gross per 24 hour  Intake 205.02 ml  Output --  Net 205.02 ml    Filed Weights   10/10/22 0308 10/11/22 0637 10/12/22 0526  Weight: 41.1 kg 39.9 kg 40.4 kg    Examination:  Gen: Awake, Alert, Oriented X 3,  HEENT: no JVD Lungs: Good air movement bilaterally, CTAB CVS: S1S2/RRR, metallic click Abd: soft, Non tender, non distended, BS present Extremities: No edema Skin: no rashes on exposed skin    Data Reviewed:   CBC: Recent Labs  Lab 10/06/22 0321 10/07/22 0259 10/08/22 0402 10/08/22 1116 10/08/22 1128 10/09/22 0623 10/10/22 0223 10/11/22 0211 10/12/22 0247  WBC 8.8 9.5 7.2  --   --  7.1 8.7 7.8 8.3  NEUTROABS 6.6 7.6 5.2  --   --  5.4  --   --   --   HGB 10.9* 11.5* 10.8*   < > 10.5* 9.7* 10.4* 10.3* 9.2*  HCT 33.3* 34.4* 33.5*   < > 31.0* 31.1* 31.9* 32.5* 28.6*  MCV 94.9 92.2  95.2  --   --  98.4 95.8 97.3 95.0  PLT 301 274 248  --   --  205 220 227 189   < > = values in this interval not displayed.   Basic Metabolic Panel: Recent Labs  Lab 10/06/22 0321 10/07/22 0259 10/08/22 0402 10/08/22 1116 10/08/22 1128 10/09/22 0623 10/10/22 0223 10/11/22 0211 10/12/22 0247  NA  134* 129* 131*   < > 131* 131* 130* 134* 130*  K 5.0 4.0 4.3   < > 4.4 4.5 4.8 4.9 4.8  CL 96* 92* 92*  --   --  95* 97* 99 96*  CO2 25 26 29   --   --  24 22 26 24   GLUCOSE 90 139* 89  --   --  92 124* 111* 107*  BUN 29* 33* 32*  --   --  32* 34* 25* 30*  CREATININE 1.40* 1.66* 1.60*  --   --  1.73* 1.47* 1.64* 2.10*  CALCIUM 9.2 9.1 8.7*  --   --  8.7* 9.0 9.5 8.9  MG 2.3 1.7 2.2  --   --   --   --   --   --    < > = values in this interval not displayed.   GFR: Estimated Creatinine Clearance: 13.6 mL/min (A) (by C-G formula based on SCr of 2.1 mg/dL (H)). Liver Function Tests: Recent Labs  Lab 10/06/22 0321 10/07/22 0259 10/08/22 0402 10/09/22 0623  AST 117* 80* 52* 38  ALT 232* 177* 121* 85*  ALKPHOS 124 124 106 100  BILITOT 0.7 0.7 0.5 0.7  PROT 6.4* 6.8 6.3* 5.9*  ALBUMIN 2.9* 2.9* 2.8* 2.8*   No results for input(s): "LIPASE", "AMYLASE" in the last 168 hours.  No results for input(s): "AMMONIA" in the last 168 hours. Coagulation Profile: Recent Labs  Lab 10/08/22 0402 10/09/22 0623 10/10/22 0223 10/11/22 0625 10/12/22 0247  INR 1.3* 1.3* 1.4* 1.8* 2.6*   Cardiac Enzymes: No results for input(s): "CKTOTAL", "CKMB", "CKMBINDEX", "TROPONINI" in the last 168 hours. BNP (last 3 results) No results for input(s): "PROBNP" in the last 8760 hours. HbA1C: No results for input(s): "HGBA1C" in the last 72 hours. CBG: No results for input(s): "GLUCAP" in the last 168 hours. Lipid Profile: No results for input(s): "CHOL", "HDL", "LDLCALC", "TRIG", "CHOLHDL", "LDLDIRECT" in the last 72 hours. Thyroid Function Tests: No results for input(s): "TSH", "T4TOTAL", "FREET4", "T3FREE", "THYROIDAB" in the last 72 hours.  Anemia Panel: Recent Labs    10/09/22 1128  VITAMINB12 1,276*  FOLATE 26.9  FERRITIN 293  TIBC 305  IRON 43  RETICCTPCT 4.2*   Urine analysis:    Component Value Date/Time   COLORURINE STRAW (A) 10/04/2022 0603   APPEARANCEUR CLEAR 10/04/2022 0603    LABSPEC 1.006 10/04/2022 0603   PHURINE 5.0 10/04/2022 0603   GLUCOSEU NEGATIVE 10/04/2022 0603   HGBUR MODERATE (A) 10/04/2022 0603   BILIRUBINUR NEGATIVE 10/04/2022 0603   KETONESUR NEGATIVE 10/04/2022 0603   PROTEINUR 100 (A) 10/04/2022 0603   NITRITE NEGATIVE 10/04/2022 0603   LEUKOCYTESUR NEGATIVE 10/04/2022 0603   Sepsis Labs: @LABRCNTIP (procalcitonin:4,lacticidven:4)  )No results found for this or any previous visit (from the past 240 hour(s)).   Radiology Studies: No results found.   Scheduled Meds:  allopurinol  200 mg Oral QPM   buPROPion  150 mg Oral q AM   empagliflozin  10 mg Oral Daily   feeding supplement  237 mL Oral BID BM   ferrous sulfate  325  mg Oral Q breakfast   furosemide  20 mg Oral Daily   hydrALAZINE  25 mg Oral Q8H   icosapent Ethyl  1 g Oral BID   levothyroxine  75 mcg Oral Q0600   memantine  5 mg Oral q morning   metoprolol succinate  50 mg Oral BID   ondansetron  4 mg Oral Once   pantoprazole  80 mg Oral Q1200   sodium chloride flush  3 mL Intravenous Q12H   sodium chloride flush  3 mL Intravenous Q12H   spironolactone  25 mg Oral Daily   Warfarin - Pharmacist Dosing Inpatient   Does not apply q1600   Continuous Infusions:  sodium chloride       LOS: 8 days    Time spent:    Zannie Cove, MD [facility]   10/12/2022, 10:14 AM

## 2022-10-12 NOTE — Progress Notes (Signed)
ANTICOAGULATION CONSULT NOTE  Pharmacy Consult for coumadin Indication:  mechanical valve  Allergies  Allergen Reactions   Ace Inhibitors Cough   Lipitor [Atorvastatin Calcium] Other (See Comments)    Knee pain    Megace [Megestrol] Other (See Comments)    High blood pressure and blurred vision   Aspirin Other (See Comments)    Nephrologist said to not take this Update 10/07/22 - patient clarifies this recommendation was lumped in with a whole bunch of other medications at that time many years ago when nephrologist told her to stay away from Tylenol, vitamins, ASA. Denies prior allergy or allergic reaction to this   Clorazepate Dipotassium Other (See Comments)    Interacts with a drug being taken   Simvastatin Other (See Comments)    Muscle pain   Tiotropium Bromide Monohydrate Other (See Comments)    Dry mouth   Codeine Nausea And Vomiting   Tape Rash   Patient Measurements: Height: 5\' 2"  (157.5 cm) Weight: 40.4 kg (89 lb) IBW/kg (Calculated) : 50.1  Vital Signs: Temp: 98.2 F (36.8 C) (05/12 0526) Temp Source: Oral (05/12 0526) BP: 108/46 (05/12 0831) Pulse Rate: 71 (05/12 0831)  Labs: Recent Labs    10/10/22 0223 10/11/22 0211 10/11/22 0625 10/11/22 1731 10/12/22 0247  HGB 10.4* 10.3*  --   --  9.2*  HCT 31.9* 32.5*  --   --  28.6*  PLT 220 227  --   --  189  LABPROT 17.5*  --  20.9*  --  28.4*  INR 1.4*  --  1.8*  --  2.6*  HEPARINUNFRC 0.68 0.75*  --  0.54 0.48  CREATININE 1.47* 1.64*  --   --  2.10*    Estimated Creatinine Clearance: 13.6 mL/min (A) (by C-G formula based on SCr of 2.1 mg/dL (H)).  Medical History:  Assessment: 28 yof with a history of mechanical mitral valve and paroxysmal atrial fibrillation on warfarin. Home dose is 5 mg (2.5 mg x 2) every Sun; 2.5 mg (2.5 mg x 1) all other days. Pharmacy consulted to transition to heparin when INR < 2.5 to allow for cardiac cath this week. INR now < 2.5.  Coumadin re-initiated 5/8. INR now  therapeutic up to 2.6. Heparin stopped per MD. No issues or bleeding noted.   Goal of Therapy:  INR 2.5-3.5 Monitor platelets by anticoagulation protocol: Yes   Plan:  Coumadin 1 mg po x 1 tonight Daily INR and CBC Monitor for s/sx of bleeding  Andreas Ohm, PharmD Pharmacy Resident  10/12/2022 11:07 AM

## 2022-10-12 NOTE — Progress Notes (Signed)
Rounding Note    Patient Name: [name] Date of Encounter: 10/12/2022  [facility] Cardiologist: Nanetta Batty, MD   Subjective   She feels well, husband by the bedside. She is ready to go home  Inpatient Medications    Scheduled Meds:  allopurinol  200 mg Oral QPM   buPROPion  150 mg Oral q AM   empagliflozin  10 mg Oral Daily   feeding supplement  237 mL Oral BID BM   ferrous sulfate  325 mg Oral Q breakfast   furosemide  20 mg Oral Daily   hydrALAZINE  25 mg Oral Q8H   icosapent Ethyl  1 g Oral BID   levothyroxine  75 mcg Oral Q0600   memantine  5 mg Oral q morning   metoprolol succinate  50 mg Oral BID   ondansetron  4 mg Oral Once   pantoprazole  80 mg Oral Q1200   sodium chloride flush  3 mL Intravenous Q12H   sodium chloride flush  3 mL Intravenous Q12H   spironolactone  25 mg Oral Daily   Warfarin - Pharmacist Dosing Inpatient   Does not apply q1600   Continuous Infusions:  sodium chloride     PRN Meds: sodium chloride, acetaminophen, ALPRAZolam, guaiFENesin-dextromethorphan, ondansetron (ZOFRAN) IV, sodium chloride flush, traZODone   Vital Signs    Vitals:   10/11/22 2012 10/11/22 2253 10/12/22 0526 10/12/22 0831  BP: (!) 128/56 124/60 (!) 103/50 (!) 108/46  Pulse: 77  70 71  Resp: 14  18   Temp: 98.9 F (37.2 C)  98.2 F (36.8 C)   TempSrc: Oral  Oral   SpO2: 96%  97%   Weight:   40.4 kg   Height:        Intake/Output Summary (Last 24 hours) at 10/12/2022 1110 Last data filed at 10/12/2022 0500 Gross per 24 hour  Intake 205.02 ml  Output --  Net 205.02 ml       10/12/2022    5:26 AM 10/11/2022    6:37 AM 10/10/2022    3:08 AM  Last 3 Weights  Weight (lbs) 89 lb 87 lb 14.4 oz 90 lb 9.6 oz  Weight (kg) 40.37 kg 39.871 kg 41.096 kg      Telemetry  A - V paced - Personally Reviewed  ECG    No new tracing - Personally Reviewed  Physical Exam   Vitals:   10/12/22 0526 10/12/22 0831  BP: (!) 103/50 (!) 108/46   Pulse: 70 71  Resp: 18   Temp: 98.2 F (36.8 C)   SpO2: 97%     GEN: No acute distress.   Neck: No JVD Cardiac: RRR, no murmurs, rubs, or gallops. Crisp mechanical valve sound Respiratory: Clear to auscultation bilaterally. GI: Soft, nontender, non-distended  MS: No edema; No deformity. Neuro:  Nonfocal  Psych: Normal affect   Labs    High Sensitivity Troponin:  No results for input(s): "TROPONINIHS" in the last 720 hours.   Chemistry Recent Labs  Lab 10/06/22 0321 10/07/22 0259 10/08/22 0402 10/08/22 1116 10/09/22 0623 10/10/22 0223 10/11/22 0211 10/12/22 0247  NA 134* 129* 131*   < > 131* 130* 134* 130*  K 5.0 4.0 4.3   < > 4.5 4.8 4.9 4.8  CL 96* 92* 92*  --  95* 97* 99 96*  CO2 25 26 29   --  24 22 26 24   GLUCOSE 90 139* 89  --  92 124* 111* 107*  BUN 29*  33* 32*  --  32* 34* 25* 30*  CREATININE 1.40* 1.66* 1.60*  --  1.73* 1.47* 1.64* 2.10*  CALCIUM 9.2 9.1 8.7*  --  8.7* 9.0 9.5 8.9  MG 2.3 1.7 2.2  --   --   --   --   --   PROT 6.4* 6.8 6.3*  --  5.9*  --   --   --   ALBUMIN 2.9* 2.9* 2.8*  --  2.8*  --   --   --   AST 117* 80* 52*  --  38  --   --   --   ALT 232* 177* 121*  --  85*  --   --   --   ALKPHOS 124 124 106  --  100  --   --   --   BILITOT 0.7 0.7 0.5  --  0.7  --   --   --   GFRNONAA 38* 31* 32*  --  30* 36* 31* 23*  ANIONGAP 13 11 10   --  12 11 9 10    < > = values in this interval not displayed.    Lipids No results for input(s): "CHOL", "TRIG", "HDL", "LABVLDL", "LDLCALC", "CHOLHDL" in the last 168 hours.  Hematology Recent Labs  Lab 10/10/22 0223 10/11/22 0211 10/12/22 0247  WBC 8.7 7.8 8.3  RBC 3.33* 3.34* 3.01*  HGB 10.4* 10.3* 9.2*  HCT 31.9* 32.5* 28.6*  MCV 95.8 97.3 95.0  MCH 31.2 30.8 30.6  MCHC 32.6 31.7 32.2  RDW 16.4* 16.6* 16.4*  PLT 220 227 189   Thyroid  Recent Labs  Lab 10/09/22 0623  TSH 2.151  FREET4 1.22*    BNP Recent Labs  Lab 10/07/22 0259 10/08/22 0402 10/09/22 0623  BNP 1,164.3* 1,156.1* 927.0*     DDimer No results for input(s): "DDIMER" in the last 168 hours.   Radiology    No results found.  Cardiac Studies   10/08/22 LHC/RHC    Prox RCA lesion is 20% stenosed.   Prox RCA to Mid RCA lesion is 30% stenosed.   Mid LM to Dist LM lesion is 20% stenosed.   Mid LAD lesion is 20% stenosed.   Mid LAD to Dist LAD lesion is 50% stenosed.   Mid Cx to Dist Cx lesion is 30% stenosed.   Mild nonobstructive CAD with 20% smooth taper with mild calcification in the distal left main; 20% proximal and 50% mid LAD stenosis; large normal high OM/ramus like vessel; 30% smooth narrowing in the mid circumflex; and dominant RCA with 20 and 30% proximal to mid stenosis.   Very mild elevation of right heart pressure with PA systolic at 32.  Mean PA pressure 23.   Mechanical mitral valve with normal excursion.   LVEDP 24 mm.   RECOMMENDATION: Guideline directed medical therapy for HFrEF.  Medical therapy for nonobstructive CAD.  Resumption of warfarin tonight or tomorrow with heparinization with mechanical MVR until therapeutic.  Right Atrium RA: A-wave 3, V wave 5; mean 3 RV: 31/2 PA: 32/13; mean 23 PW: A-wave 14; V wave 23; mean 15  Ao: 133/48 LV: 130/24  Oxygen saturation in the PA 57% and AO 94%  By the Fick method, cardiac output 3.4 L/min and cardiac index 2.6 L/min/m.  PVR: 2.4 WU   Diagnostic Dominance: Right   Patient Profile     82 y.o. female with mechanical mitral valve (repaired 2002, replacement 2007), history of systolic heart failure s/p [employer] biventricular ICD with  initial improvement in LVEF but recent decline, hypertension, hyperlipidemia, CKD stage III, PAF, hypothyroidism, COPD, low BMI who presented to [facility] with abdominal discomfort and elevated LFTs felt possibly related to acute systolic CHF.   Assessment & Plan    Acute on chronic HFrEF, h/o CRT-D EF had beeen as low as 10% in the past, improved to 45-50% post CRT-D with recent decline 09/2022 with  EF to 20-25%. LHC/RHC on 5/8 revealed mild non-obstructive CAD. LVEDP 24mm. CO by Fick 3.4, CI 2.6. - euvolemic today - continue lasix 20 mg daily - continue metop XL 50 mg scheduled as BID - stopped some GDMT 2/2 crt 2.1/GFR 23/ K 4.8 from crt 1.64 - stopped entresto 24-26 mg BID  - stop spironolactone 25 mg daily - stop jardiance 10 mg daily    H/o MV repair 2002, replacement 2007  Echo as above with normal function/structure, also with mild AI, moderate TR. -INR 1.3 this AM. On Coumadin PTA, on heparin per pharmacy   Essential HTN  - continue hydralazine 50 mg TID, if BP too low, can scale back   Hyperlipidemia   Lipid panel in 09/2022: Total Cholesterol 273, Triglycerides 383, HDL 34, LDL 165.  -Intolerant to high-intensity statins. On Pravastatin 40mg  daily at home ; can restart with improving LFTs -Continue Vascepa - prior notes indicate 2g BID but on 1g BID presently   Elevated LFTs  Thought to be due to hepatic congestion, improved   Lab Results  Component Value Date   ALT 85 (H) 10/09/2022   AST 38 10/09/2022   ALKPHOS 100 10/09/2022   BILITOT 0.7 10/09/2022   Non obstructive CAD  LHC with non-obstructive CAD. Medical management.    AKI on CKD 3b, hyponatremia  Baseline appears to be around 1.3-1.4, 1.60->1.73 today.   PAF  AV paced on telemetry. Resume Warfarin per pharmacy dosing post cath.   She is doing well today. Could not tolerate some GDMT with renal function. Recommend BMET in 2 weeks. Otherwise, she can be discharged from a cardiology standpoint.  For questions or updates, please contact [facility] Please consult [email or web address] for contact info under        Signed, Maisie Fus, MD  10/12/2022, 11:10 AM

## 2022-10-12 NOTE — Plan of Care (Signed)

## 2022-10-13 DIAGNOSIS — I5043 Acute on chronic combined systolic (congestive) and diastolic (congestive) heart failure: Secondary | ICD-10-CM | POA: Diagnosis not present

## 2022-10-13 DIAGNOSIS — E871 Hypo-osmolality and hyponatremia: Secondary | ICD-10-CM

## 2022-10-13 DIAGNOSIS — R7989 Other specified abnormal findings of blood chemistry: Secondary | ICD-10-CM | POA: Diagnosis not present

## 2022-10-13 DIAGNOSIS — N179 Acute kidney failure, unspecified: Secondary | ICD-10-CM

## 2022-10-13 DIAGNOSIS — Z952 Presence of prosthetic heart valve: Secondary | ICD-10-CM | POA: Diagnosis not present

## 2022-10-13 LAB — BASIC METABOLIC PANEL
Anion gap: 6 (ref 5–15)
BUN: 31 mg/dL — ABNORMAL HIGH (ref 8–23)
CO2: 24 mmol/L (ref 22–32)
Calcium: 9 mg/dL (ref 8.9–10.3)
Chloride: 95 mmol/L — ABNORMAL LOW (ref 98–111)
Creatinine, Ser: 2.36 mg/dL — ABNORMAL HIGH (ref 0.44–1.00)
GFR, Estimated: 20 mL/min — ABNORMAL LOW (ref >60)
Glucose, Bld: 89 mg/dL (ref 70–99)
Potassium: 5.1 mmol/L (ref 3.5–5.1)
Sodium: 125 mmol/L — ABNORMAL LOW (ref 135–145)

## 2022-10-13 LAB — PROTIME-INR
INR: 2.3 — ABNORMAL HIGH (ref 0.8–1.2)
Prothrombin Time: 25.3 seconds — ABNORMAL HIGH (ref 11.4–15.2)

## 2022-10-13 MED ORDER — WARFARIN SODIUM 5 MG PO TABS
5.0000 mg | ORAL_TABLET | Freq: Once | ORAL | Status: AC
  Administered 2022-10-13: 5 mg via ORAL
  Filled 2022-10-13: qty 1

## 2022-10-13 MED ORDER — WARFARIN SODIUM 2.5 MG PO TABS: 2.5000 mg | ORAL_TABLET | Freq: Every day | ORAL | Status: DC

## 2022-10-13 MED ORDER — SENNOSIDES-DOCUSATE SODIUM 8.6-50 MG PO TABS
1.0000 | ORAL_TABLET | Freq: Every evening | ORAL | Status: DC | PRN
  Administered 2022-10-13: 1 via ORAL
  Filled 2022-10-13: qty 1

## 2022-10-13 MED ORDER — BISACODYL 5 MG PO TBEC
5.0000 mg | DELAYED_RELEASE_TABLET | Freq: Every day | ORAL | Status: DC | PRN
  Administered 2022-10-13: 5 mg via ORAL
  Filled 2022-10-13: qty 1

## 2022-10-13 NOTE — Plan of Care (Signed)

## 2022-10-13 NOTE — Progress Notes (Signed)
Mobility Specialist Progress Note:    10/13/22 1427  Mobility  Activity Ambulated independently in hallway  Level of Assistance Independent  Assistive Device Four wheel walker  Distance Ambulated (ft) 500 ft  Activity Response Tolerated well  Mobility Referral Yes  $Mobility charge 1 Mobility  Mobility Specialist Start Time (ACUTE ONLY) 1405  Mobility Specialist Stop Time (ACUTE ONLY) 1420  Mobility Specialist Time Calculation (min) (ACUTE ONLY) 15 min   Pt received in bed, eager to ambulate. No c/o throughout. Pt assisted back in bed with call light at hand.  Thompson Grayer Mobility Specialist  Please contact vis Secure Chat or  Rehab Office [phone]

## 2022-10-13 NOTE — Progress Notes (Signed)
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 -Initially treated with Mucomyst infusion given questionable concern for Tylenol toxicity -Treated for CHF exacerbation, primarily suspected to have congestive hepatopathy  Subjective: -Feels well, denies any complaints  Assessment and Plan:  Acute on chronic combined systolic and diastolic CHF -admitted with orthopnea PND and swelling -Last echo 07/2022 with EF of 20 to 25%, normal RV, mitral valve unremarkable -Has been diuresed with IV Lasix, down 12 pounds, clinically appears euvolemic -Right and left heart cath 5/8 with mild nonobstructive CAD, mild elevation of right heart pressures -Cards following, volume status has improved however creatinine continues to trend up after starting Entresto 5/11, Entresto Aldactone and Jardiance on hold, Lasix discontinued -Monitor urine output, BMP in a.m., sodium has trended down, check orthostatics, may need small fluid bolus  Abnormal LFTs -Secondary to passive hepatic congestion, in the background of liver cirrhosis, no obstructive pathology -Was also initially treated for Tylenol toxicity -undetectable Tylenol level -Improving  Suspected cirrhosis -?  Cardiac cirrhosis versus NAFLD -See above, now holding all diuretics  Normocytic anemia -anemia panel w/ Iron defi, given IV iron  COPD (chronic obstructive pulmonary disease) (HCC) - Stable and not in exacerbation - Continue home bronchodilator  Mixed hyperlipidemia -continue Vascepa -holding statin   Hypertension - Continue beta-blocker and spironolactone  Hypothyroidism - Continue levothyroxine  Paroxysmal atrial fibrillation (HCC) - Continue  beta-blocker  History of mitral valve replacement with mechanical valve -INR is therapeutic today, discontinue heparin, continue warfarin  Biventricular ICD (implantable cardioverter-defibrillator) in place Noted  Mood disorder -Stable, continue Wellbutrin and Xanax per home regimen   DVT prophylaxis: Warfarin Code Status: Full Code Family Communication: None present Disposition Plan: Home when kidney function has improved to  Consultants:  Cards  Procedures:   Antimicrobials:    Objective: Vitals:   10/12/22 1930 10/12/22 2129 10/13/22 0458 10/13/22 0915  BP: (!) 115/55 (!) 128/94 129/66 122/68  Pulse: 69 75 70   Resp: 18  18 16   Temp: 98.4 F (36.9 C)  98.6 F (37 C)   TempSrc: Oral  Oral   SpO2: 97%  97%   Weight:   43.1 kg   Height:        Intake/Output Summary (Last 24 hours) at 10/13/2022 1202 Last data filed at 10/12/2022 2050 Gross per 24 hour  Intake 240 ml  Output --  Net 240 ml    Filed Weights   10/11/22 0637 10/12/22 0526 10/13/22 0458  Weight: 39.9 kg 40.4 kg 43.1 kg    Examination:  AAOx3, no distress HEENT: No JVD CVS: S1-S2, regular rhythm, metallic click Lungs: Clear bilaterally Abdomen: Soft, nontender, bowel sounds present Extremities: No edema  Skin: no rashes on exposed skin    Data Reviewed:   CBC: Recent Labs  Lab 10/07/22 0259 10/08/22 0402 10/08/22 1116 10/08/22 1128 10/09/22 0623 10/10/22 0223 10/11/22 0211 10/12/22 0247  WBC 9.5 7.2  --   --  7.1 8.7 7.8 8.3  NEUTROABS 7.6 5.2  --   --  5.4  --   --   --   HGB 11.5* 10.8*   < > 10.5* 9.7* 10.4* 10.3* 9.2*  HCT 34.4* 33.5*   < > 31.0* 31.1* 31.9* 32.5* 28.6*  MCV 92.2 95.2  --   --  98.4 95.8 97.3 95.0  PLT 274 248  --   --  205 220 227 189   < > = values in this interval not displayed.   Basic Metabolic Panel: Recent Labs  Lab 10/07/22 0259 10/08/22 0402 10/08/22 1116 10/09/22 6578 10/10/22 0223 10/11/22 0211 10/12/22 0247 10/13/22 0238  NA 129*  131*   < > 131* 130* 134* 130* 125*  K 4.0 4.3   < > 4.5 4.8 4.9 4.8 5.1  CL 92* 92*  --  95* 97* 99 96* 95*  CO2 26 29  --  24 22 26 24 24   GLUCOSE 139* 89  --  92 124* 111* 107* 89  BUN 33* 32*  --  32* 34* 25* 30* 31*  CREATININE 1.66* 1.60*  --  1.73* 1.47* 1.64* 2.10* 2.36*  CALCIUM 9.1 8.7*  --  8.7* 9.0 9.5 8.9 9.0  MG 1.7 2.2  --   --   --   --   --   --    < > = values in this interval not displayed.   GFR: Estimated Creatinine Clearance: 12.9 mL/min (A) (by C-G formula based on SCr of 2.36 mg/dL (H)). Liver Function Tests: Recent Labs  Lab 10/07/22 0259 10/08/22 0402 10/09/22 0623  AST 80* 52* 38  ALT 177* 121* 85*  ALKPHOS 124 106 100  BILITOT 0.7 0.5 0.7  PROT 6.8 6.3* 5.9*  ALBUMIN 2.9* 2.8* 2.8*   No results for input(s): "LIPASE", "AMYLASE" in the last 168 hours.  No results for input(s): "AMMONIA" in the last 168 hours. Coagulation Profile: Recent Labs  Lab 10/09/22 0623 10/10/22 0223 10/11/22 0625 10/12/22 0247 10/13/22 0238  INR 1.3* 1.4* 1.8* 2.6* 2.3*   Cardiac Enzymes: No results for input(s): "CKTOTAL", "CKMB", "CKMBINDEX", "TROPONINI" in the last 168 hours. BNP (last 3 results) No results for input(s): "PROBNP" in the last 8760 hours. HbA1C: No results for input(s): "HGBA1C" in the last 72 hours. CBG: No results for input(s): "GLUCAP" in the last 168 hours. Lipid Profile: No results for input(s): "CHOL", "HDL", "LDLCALC", "TRIG", "CHOLHDL", "LDLDIRECT" in the last 72 hours. Thyroid Function Tests: No results for input(s): "TSH", "T4TOTAL", "FREET4", "T3FREE", "THYROIDAB" in the last 72 hours.  Anemia Panel: No results for input(s): "VITAMINB12", "FOLATE", "FERRITIN", "TIBC", "IRON", "RETICCTPCT" in the last 72 hours.  Urine analysis:    Component Value Date/Time   COLORURINE STRAW (A) 10/04/2022 0603   APPEARANCEUR CLEAR 10/04/2022 0603   LABSPEC 1.006 10/04/2022 0603   PHURINE 5.0 10/04/2022 0603   GLUCOSEU NEGATIVE 10/04/2022 0603    HGBUR MODERATE (A) 10/04/2022 0603   BILIRUBINUR NEGATIVE 10/04/2022 0603   KETONESUR NEGATIVE 10/04/2022 0603   PROTEINUR 100 (A) 10/04/2022 0603   NITRITE NEGATIVE 10/04/2022 0603   LEUKOCYTESUR NEGATIVE 10/04/2022 0603   Sepsis Labs: @LABRCNTIP (procalcitonin:4,lacticidven:4)  )No results found for this or any previous visit (from the past 240 hour(s)).   Radiology Studies: No results found.   Scheduled Meds:  allopurinol  200 mg Oral QPM   buPROPion  150 mg Oral q AM   feeding supplement  237 mL Oral BID BM   ferrous sulfate  325 mg Oral Q breakfast   hydrALAZINE  25 mg Oral Q8H   icosapent Ethyl  1 g Oral BID   levothyroxine  75 mcg Oral Q0600   memantine  5 mg Oral q morning   metoprolol succinate  50 mg Oral BID   pantoprazole  80 mg Oral Q1200   sodium  chloride flush  3 mL Intravenous Q12H   sodium chloride flush  3 mL Intravenous Q12H   Warfarin - Pharmacist Dosing Inpatient   Does not apply q1600   Continuous Infusions:  sodium chloride       LOS: 9 days    Time spent:    Zannie Cove, MD [facility]   10/13/2022, 12:02 PM

## 2022-10-13 NOTE — Progress Notes (Signed)
Rounding Note    Patient Name: [name] Date of Encounter: 10/13/2022  [facility] Cardiologist: Nanetta Batty, MD   Subjective   Feels well.  Lying completely supine in bed without any respiratory difficulty.  Creatinine has worsened. Hyponatremia is worse as well. Net diuresis 1.5 L.  Weight appears to be unreliable. Although at her last device download the pacemaker reported 96% effective biventricular pacing, this is not the case when reviewing her monitor strips.  There is frequent changing QRS morphology suggesting loss of LV capture.  This appears to happen more commonly with atrial sensed events, but also occasionally with atrial paced rhythm.  Inpatient Medications    Scheduled Meds:  allopurinol  200 mg Oral QPM   buPROPion  150 mg Oral q AM   feeding supplement  237 mL Oral BID BM   ferrous sulfate  325 mg Oral Q breakfast   hydrALAZINE  25 mg Oral Q8H   icosapent Ethyl  1 g Oral BID   levothyroxine  75 mcg Oral Q0600   memantine  5 mg Oral q morning   metoprolol succinate  50 mg Oral BID   pantoprazole  80 mg Oral Q1200   sodium chloride flush  3 mL Intravenous Q12H   sodium chloride flush  3 mL Intravenous Q12H   Warfarin - Pharmacist Dosing Inpatient   Does not apply q1600   Continuous Infusions:  sodium chloride     PRN Meds: sodium chloride, acetaminophen, ALPRAZolam, guaiFENesin-dextromethorphan, ondansetron (ZOFRAN) IV, sodium chloride flush, traZODone   Vital Signs    Vitals:   10/12/22 1930 10/12/22 2129 10/13/22 0458 10/13/22 0915  BP: (!) 115/55 (!) 128/94 129/66 122/68  Pulse: 69 75 70   Resp: 18  18 16   Temp: 98.4 F (36.9 C)  98.6 F (37 C)   TempSrc: Oral  Oral   SpO2: 97%  97%   Weight:   43.1 kg   Height:        Intake/Output Summary (Last 24 hours) at 10/13/2022 0952 Last data filed at 10/12/2022 2050 Gross per 24 hour  Intake 240 ml  Output --  Net 240 ml      10/13/2022    4:58 AM 10/12/2022    5:26 AM  10/11/2022    6:37 AM  Last 3 Weights  Weight (lbs) 95 lb 0.3 oz 89 lb 87 lb 14.4 oz  Weight (kg) 43.1 kg 40.37 kg 39.871 kg      Telemetry    Sinus rhythm alternating with atrial pacing, consistent ventricular pacing with both sensed and paced events, but there is variation in QRS morphology suggesting intermittent loss of LV capture - Personally Reviewed  ECG    Atrial sensed, ventricular paced rhythm with relatively narrow QRS at 134 ms- Personally Reviewed  Physical Exam  Appears comfortable lying fully supine in bed GEN: No acute distress.   Neck: JVP 6-7 cm Cardiac: RRR, paradoxically split S2, crisp prosthetic valve clicks, 1/6 ejection murmur, no diastolic murmurs, rubs, or gallops.  Respiratory: Clear to auscultation bilaterally. GI: Soft, nontender, non-distended  MS: No edema; No deformity. Neuro:  Nonfocal  Psych: Normal affect   Labs    High Sensitivity Troponin:  No results for input(s): "TROPONINIHS" in the last 720 hours.   Chemistry Recent Labs  Lab 10/07/22 0259 10/08/22 0402 10/08/22 1116 10/09/22 1610 10/10/22 0223 10/11/22 0211 10/12/22 0247 10/13/22 0238  NA 129* 131*   < > 131*   < > 134*  130* 125*  K 4.0 4.3   < > 4.5   < > 4.9 4.8 5.1  CL 92* 92*  --  95*   < > 99 96* 95*  CO2 26 29  --  24   < > 26 24 24   GLUCOSE 139* 89  --  92   < > 111* 107* 89  BUN 33* 32*  --  32*   < > 25* 30* 31*  CREATININE 1.66* 1.60*  --  1.73*   < > 1.64* 2.10* 2.36*  CALCIUM 9.1 8.7*  --  8.7*   < > 9.5 8.9 9.0  MG 1.7 2.2  --   --   --   --   --   --   PROT 6.8 6.3*  --  5.9*  --   --   --   --   ALBUMIN 2.9* 2.8*  --  2.8*  --   --   --   --   AST 80* 52*  --  38  --   --   --   --   ALT 177* 121*  --  85*  --   --   --   --   ALKPHOS 124 106  --  100  --   --   --   --   BILITOT 0.7 0.5  --  0.7  --   --   --   --   GFRNONAA 31* 32*  --  30*   < > 31* 23* 20*  ANIONGAP 11 10  --  12   < > 9 10 6    < > = values in this interval not displayed.    Lipids  No results for input(s): "CHOL", "TRIG", "HDL", "LABVLDL", "LDLCALC", "CHOLHDL" in the last 168 hours.  Hematology Recent Labs  Lab 10/10/22 0223 10/11/22 0211 10/12/22 0247  WBC 8.7 7.8 8.3  RBC 3.33* 3.34* 3.01*  HGB 10.4* 10.3* 9.2*  HCT 31.9* 32.5* 28.6*  MCV 95.8 97.3 95.0  MCH 31.2 30.8 30.6  MCHC 32.6 31.7 32.2  RDW 16.4* 16.6* 16.4*  PLT 220 227 189   Thyroid  Recent Labs  Lab 10/09/22 0623  TSH 2.151  FREET4 1.22*    BNP Recent Labs  Lab 10/07/22 0259 10/08/22 0402 10/09/22 0623  BNP 1,164.3* 1,156.1* 927.0*    DDimer No results for input(s): "DDIMER" in the last 168 hours.   Radiology    No results found.  Cardiac Studies   Cardiac catheterization 10/08/2022    Prox RCA lesion is 20% stenosed.   Prox RCA to Mid RCA lesion is 30% stenosed.   Mid LM to Dist LM lesion is 20% stenosed.   Mid LAD lesion is 20% stenosed.   Mid LAD to Dist LAD lesion is 50% stenosed.   Mid Cx to Dist Cx lesion is 30% stenosed.   Mild nonobstructive CAD with 20% smooth taper with mild calcification in the distal left main; 20% proximal and 50% mid LAD stenosis; large normal high OM/ramus like vessel; 30% smooth narrowing in the mid circumflex; and dominant RCA with 20 and 30% proximal to mid stenosis.   Very mild elevation of right heart pressure with PA systolic at 32.  Mean PA pressure 23.   Mechanical mitral valve with normal excursion.   LVEDP 24 mm.   RECOMMENDATION: Guideline directed medical therapy for HFrEF.  Medical therapy for nonobstructive CAD.  Resumption of warfarin tonight or tomorrow with heparinization with  mechanical MVR until therapeutic.    Echocardiogram 09/08/2022   1. Left ventricular ejection fraction, by estimation, is 20 to 25%. The  left ventricle has severely decreased function. The left ventricle  demonstrates global hypokinesis. The left ventricular internal cavity size  was mildly dilated. Left ventricular  diastolic parameters are  indeterminate.   2. Right ventricular systolic function is normal. The right ventricular  size is normal.   3. Left atrial size was severely dilated.   4. The mitral valve has been repaired/replaced. No evidence of mitral  valve regurgitation. No evidence of mitral stenosis. Echo findings are  consistent with normal structure and function of the mitral valve  prosthesis.   5. Tricuspid valve regurgitation is moderate.   6. The aortic valve is tricuspid. There is mild calcification of the  aortic valve. There is mild thickening of the aortic valve. Aortic valve  regurgitation is mild. Aortic valve sclerosis is present, with no evidence  of aortic valve stenosis. Aortic  regurgitation PHT measures 379 msec.   7. The inferior vena cava is normal in size with greater than 50%  respiratory variability, suggesting right atrial pressure of 3 mmHg.   Comparison(s): Prior images reviewed side by side.    Patient Profile     82 y.o. female with mechanical mitral valve (repaired 2002, replacement 2007), history of systolic heart failure s/p [employer] biventricular ICD with initial improvement in LVEF but recent decline, hypertension, hyperlipidemia, CKD stage III, PAF, hypothyroidism, COPD, low BMI who presented to [facility] with abdominal discomfort and elevated LFTs felt possibly related to acute systolic CHF.   Assessment & Plan    Currently appears to be hypovolemic based on renal function and sodium level. Diuretics are on hold. Will check her device and see if we can reprogram the LV pacing outputs and/or AV delays to improve the efficiency of true biventricular pacing.  Will also recheck her thoracic impedance to confirm the impression of normal volume status. Renal function does not allow increased medical therapy for cardiomyopathy at this time. She is markedly hyponatremic, but thankfully asymptomatic from this point of view.  Fluid restriction. INR is therapeutic in last 48 h.     For  questions or updates, please contact [facility] Please consult [email or web address] for contact info under        Signed, Thurmon Fair, MD  10/13/2022, 9:52 AM

## 2022-10-13 NOTE — Progress Notes (Signed)
   Heart Failure Stewardship Pharmacist Progress Note   PCP: Garlan Fillers, MD PCP-Cardiologist: Nanetta Batty, MD   HPI:  82 YO female with a PMH of mechanical mitral valve on warfarin, biventricular PPM/ICD, chronic diastolic heart failure, paroxysmal atrial fibrillation, COPD,HTN, HLD, CKD3a   Patient presented to [facility] ED on 5/3 with complaints of abdominal pain. She also reports dyspnea this week requiring up to 3 pillows when she sleeps and felt her legs have been more edematous. She takes 20 mg of Lasix daily and missed just 1 dose the night prior to admission. CXR also showing vascular congestion. Recently her LVEF had declined further down to 20 to 25% after initially improving to 45 to 50% after CRT-D therapy in 2021. BNP 3914. Echo from 4/8 showing EF 20-25%, global hypokinesis, normal RV, severely dilated left atrial size, mild AVR, and moderate TVR. R/LHC from today (5/8) showing multiple mild stenoses and 50% stenosed mid LAD-dist LAD; PAP 23 mmHg, LVEDP 24 mmHg, CO 3.41, CI 2.45.    Pt reported an allergy to [place] ~6 months ago described as severe foot swelling that required admission to the ED. After speaking with patient again on 5/10, she reports that the allergy could have been related to Augmentin as opposed to [place].   Current HF Medications: Beta Blocker: metoprolol succinate 50 mg BID  Prior to admission HF Medications: Diuretic: furosemide 20 mg PO daily Beta blocker: metoprolol succinate 50 mg BID MRA: spironolactone 25 mg daily  Pertinent Lab Values: Serum creatinine 2.36, BUN 31, Potassium 5.1, Sodium 125, BNP 927, Magnesium 2.2  Vital Signs: Weight: 95 lbs (admission weight: 92 lbs) Blood pressure: 120/60s  Heart rate: 70s  I/O: incomplete yesterday; net -1.5L  Medication Assistance / Insurance Benefits Check: Does the patient have prescription insurance?  Yes Type of insurance plan: Medicare  Does the patient qualify for medication assistance  through manufacturers or grants?   Yes Eligible grants and/or patient assistance programs: None  Medication assistance applications in progress: None currently   Medication assistance applications approved: None Approved medication assistance renewals will be completed by: N/A  Outpatient Pharmacy:  Prior to admission outpatient pharmacy: [facility] Is the patient willing to use [facility] pharmacy at discharge? Yes Is the patient willing to transition their outpatient pharmacy to utilize a [facility] outpatient pharmacy? No   Assessment: 1. Acute on chronic systolic CHF (LVEF 20-25%), due to NICM. NYHA class IV symptoms. - Scr 1.64>2.10>2.36 after starting Entresto on 5/11. Received both Jardiance and spironolactone before orders were discontinued on 5/12. Strict I's and O's. Keep K >4 and Mg >2.  - Recommend continuing to hold furosemide, Jardiance, Entresto, and spironolactone with AKI - Continue Toprol-XL 50mg  BID - Continue hydralazine 25 mg TID for BP control while awaiting further renal improvement   Plan: 1) Medication changes recommended at this time: - Continue to hold Entresto, Jardiance, and spironolactone  2) Patient assistance: - None  3)  Education  - Patient has been educated on current HF medications and potential additions to HF medication regimen - Patient verbalizes understanding that over the next few months, these medication doses may change and more medications may be added to optimize HF regimen - Patient has been educated on basic disease state pathophysiology and goals of therapy   Sharen Hones, PharmD, BCPS Heart Failure Stewardship Pharmacist Phone [phone]

## 2022-10-13 NOTE — Progress Notes (Signed)
ANTICOAGULATION CONSULT NOTE  Pharmacy Consult for coumadin Indication:  mechanical valve  - mitral  Allergies  Allergen Reactions   Ace Inhibitors Cough   Lipitor [Atorvastatin Calcium] Other (See Comments)    Knee pain    Megace [Megestrol] Other (See Comments)    High blood pressure and blurred vision   Aspirin Other (See Comments)    Nephrologist said to not take this Update 10/07/22 - patient clarifies this recommendation was lumped in with a whole bunch of other medications at that time many years ago when nephrologist told her to stay away from Tylenol, vitamins, ASA. Denies prior allergy or allergic reaction to this   Clorazepate Dipotassium Other (See Comments)    Interacts with a drug being taken   Simvastatin Other (See Comments)    Muscle pain   Tiotropium Bromide Monohydrate Other (See Comments)    Dry mouth   Codeine Nausea And Vomiting   Tape Rash   Patient Measurements: Height: 5\' 2"  (157.5 cm) Weight: 43.1 kg (95 lb 0.3 oz) IBW/kg (Calculated) : 50.1  Vital Signs: Temp: 98.2 F (36.8 C) (05/13 1254) Temp Source: Oral (05/13 1254) BP: 124/71 (05/13 1254) Pulse Rate: 69 (05/13 1254)  Labs: Recent Labs    10/11/22 0211 10/11/22 0625 10/11/22 1731 10/12/22 0247 10/13/22 0238  HGB 10.3*  --   --  9.2*  --   HCT 32.5*  --   --  28.6*  --   PLT 227  --   --  189  --   LABPROT  --  20.9*  --  28.4* 25.3*  INR  --  1.8*  --  2.6* 2.3*  HEPARINUNFRC 0.75*  --  0.54 0.48  --   CREATININE 1.64*  --   --  2.10* 2.36*    Estimated Creatinine Clearance: 12.9 mL/min (A) (by C-G formula based on SCr of 2.36 mg/dL (H)).  Medical History:  Assessment: 89 yof with a history of mechanical mitral valve and paroxysmal atrial fibrillation on warfarin PTA. Home dose is 5 mg (2.5 mg x 2) every Sun; 2.5 mg (2.5 mg x 1) all other days. Pharmacy consulted to transition to heparin when INR < 2.5 to allow for cardiac cath. INR now < 2.5. S/p post completed procedures    Coumadin re-initiated 5/8. INR up 2.6 >2.3 after lower warfarin dose last pm Heparin stopped this weekend  per MD. No issues or bleeding noted.  Will bost warfarin tonight and then resume PTA dosing  Goal of Therapy:  INR 2.5-3.5 Monitor platelets by anticoagulation protocol: Yes   Plan:  Coumadin 5mg  po x 1 tonight then 2.5mg  daily Daily INR and CBC Monitor for s/sx of bleeding    Leota Sauers Pharm.D. CPP, BCPS Clinical Pharmacist [phone] 10/13/2022 2:52 PM

## 2022-10-14 ENCOUNTER — Other Ambulatory Visit (HOSPITAL_COMMUNITY): Payer: Self-pay

## 2022-10-14 DIAGNOSIS — R7989 Other specified abnormal findings of blood chemistry: Secondary | ICD-10-CM | POA: Diagnosis not present

## 2022-10-14 LAB — BASIC METABOLIC PANEL
Anion gap: 10 (ref 5–15)
BUN: 34 mg/dL — ABNORMAL HIGH (ref 8–23)
CO2: 23 mmol/L (ref 22–32)
Calcium: 9.2 mg/dL (ref 8.9–10.3)
Chloride: 95 mmol/L — ABNORMAL LOW (ref 98–111)
Creatinine, Ser: 2.02 mg/dL — ABNORMAL HIGH (ref 0.44–1.00)
GFR, Estimated: 24 mL/min — ABNORMAL LOW (ref >60)
Glucose, Bld: 86 mg/dL (ref 70–99)
Potassium: 4.8 mmol/L (ref 3.5–5.1)
Sodium: 128 mmol/L — ABNORMAL LOW (ref 135–145)

## 2022-10-14 LAB — PROTIME-INR
INR: 2.2 — ABNORMAL HIGH (ref 0.8–1.2)
Prothrombin Time: 24.8 seconds — ABNORMAL HIGH (ref 11.4–15.2)

## 2022-10-14 MED ORDER — WARFARIN SODIUM 5 MG PO TABS: 5.0000 mg | ORAL_TABLET | Freq: Once | ORAL | Status: DC

## 2022-10-14 MED ORDER — WARFARIN SODIUM 2.5 MG PO TABS: 2.5000 mg | ORAL_TABLET | Freq: Every day | ORAL | Status: DC

## 2022-10-14 MED ORDER — ISOSORBIDE MONONITRATE ER 30 MG PO TB24
15.0000 mg | ORAL_TABLET | Freq: Every day | ORAL | 0 refills | Status: DC
  Filled 2022-10-14: qty 30, 60d supply, fill #0

## 2022-10-14 MED ORDER — ISOSORBIDE MONONITRATE ER 30 MG PO TB24
15.0000 mg | ORAL_TABLET | Freq: Every day | ORAL | Status: DC
  Administered 2022-10-14: 15 mg via ORAL
  Filled 2022-10-14: qty 1

## 2022-10-14 MED ORDER — FERROUS SULFATE 325 (65 FE) MG PO TABS
325.0000 mg | ORAL_TABLET | Freq: Every day | ORAL | 0 refills | Status: DC
  Filled 2022-10-14: qty 100, 100d supply, fill #0

## 2022-10-14 MED ORDER — SPIRONOLACTONE 25 MG PO TABS: 12.5000 mg | ORAL_TABLET | Freq: Every day | ORAL | Status: DC

## 2022-10-14 MED ORDER — HYDRALAZINE HCL 25 MG PO TABS
25.0000 mg | ORAL_TABLET | Freq: Three times a day (TID) | ORAL | 0 refills | Status: DC
  Filled 2022-10-14: qty 90, 30d supply, fill #0

## 2022-10-14 MED ORDER — FUROSEMIDE 40 MG PO TABS: 20.0000 mg | ORAL_TABLET | Freq: Every day | ORAL | Status: DC

## 2022-10-14 NOTE — Plan of Care (Signed)

## 2022-10-14 NOTE — Progress Notes (Signed)
Rounding Note    Patient Name: [name] Date of Encounter: 10/14/2022  [facility] Cardiologist: Nanetta Batty, MD   Subjective   Sleeping fully supine.  Incomplete in/out record. Yesterday weight clearly erroneous. Weight stable at 89 lb. Device interrogation shows normal function, no recent arrhythmia, good BiV pacing percentage and Optivol has returned to baseline (no longer hypervolemic). LV lead output adjusted. Labs pending.  Inpatient Medications    Scheduled Meds:  allopurinol  200 mg Oral QPM   buPROPion  150 mg Oral q AM   feeding supplement  237 mL Oral BID BM   ferrous sulfate  325 mg Oral Q breakfast   hydrALAZINE  25 mg Oral Q8H   icosapent Ethyl  1 g Oral BID   levothyroxine  75 mcg Oral Q0600   memantine  5 mg Oral q morning   metoprolol succinate  50 mg Oral BID   pantoprazole  80 mg Oral Q1200   sodium chloride flush  3 mL Intravenous Q12H   sodium chloride flush  3 mL Intravenous Q12H   warfarin  2.5 mg Oral q1600   Warfarin - Pharmacist Dosing Inpatient   Does not apply q1600   Continuous Infusions:  sodium chloride     PRN Meds: sodium chloride, acetaminophen, ALPRAZolam, bisacodyl, guaiFENesin-dextromethorphan, ondansetron (ZOFRAN) IV, senna-docusate, sodium chloride flush, traZODone   Vital Signs    Vitals:   10/13/22 0915 10/13/22 1254 10/13/22 2121 10/14/22 0429  BP: 122/68 124/71 129/61 (!) 133/57  Pulse:  69 80 70  Resp: 16 15 16 18   Temp:  98.2 F (36.8 C) 98.7 F (37.1 C) 98.3 F (36.8 C)  TempSrc:  Oral Oral Oral  SpO2:   95% 96%  Weight:    40.5 kg  Height:        Intake/Output Summary (Last 24 hours) at 10/14/2022 0938 Last data filed at 10/14/2022 0434 Gross per 24 hour  Intake 240 ml  Output 100 ml  Net 140 ml      10/14/2022    4:29 AM 10/13/2022    4:58 AM 10/12/2022    5:26 AM  Last 3 Weights  Weight (lbs) 89 lb 4.8 oz 95 lb 0.3 oz 89 lb  Weight (kg) 40.506 kg 43.1 kg 40.37 kg       Telemetry    Sinus/Apaced with consistent BiV pacing. - Personally Reviewed  ECG    No new tracing - Personally Reviewed  Physical Exam  Underweight/borderline cachectic GEN: No acute distress.   Neck: No JVD Cardiac: RRR, crisp prosthetic valve clicks, soft aortic ejection murmur, no diastolic murmurs, rubs, or gallops.  Respiratory: Clear to auscultation bilaterally. GI: Soft, nontender, non-distended  MS: No edema; No deformity. Neuro:  Nonfocal  Psych: Normal affect   Labs    High Sensitivity Troponin:  No results for input(s): "TROPONINIHS" in the last 720 hours.   Chemistry Recent Labs  Lab 10/08/22 0402 10/08/22 1116 10/09/22 6213 10/10/22 0223 10/11/22 0211 10/12/22 0247 10/13/22 0238  NA 131*   < > 131*   < > 134* 130* 125*  K 4.3   < > 4.5   < > 4.9 4.8 5.1  CL 92*  --  95*   < > 99 96* 95*  CO2 29  --  24   < > 26 24 24   GLUCOSE 89  --  92   < > 111* 107* 89  BUN 32*  --  32*   < >  25* 30* 31*  CREATININE 1.60*  --  1.73*   < > 1.64* 2.10* 2.36*  CALCIUM 8.7*  --  8.7*   < > 9.5 8.9 9.0  MG 2.2  --   --   --   --   --   --   PROT 6.3*  --  5.9*  --   --   --   --   ALBUMIN 2.8*  --  2.8*  --   --   --   --   AST 52*  --  38  --   --   --   --   ALT 121*  --  85*  --   --   --   --   ALKPHOS 106  --  100  --   --   --   --   BILITOT 0.5  --  0.7  --   --   --   --   GFRNONAA 32*  --  30*   < > 31* 23* 20*  ANIONGAP 10  --  12   < > 9 10 6    < > = values in this interval not displayed.    Lipids No results for input(s): "CHOL", "TRIG", "HDL", "LABVLDL", "LDLCALC", "CHOLHDL" in the last 168 hours.  Hematology Recent Labs  Lab 10/10/22 0223 10/11/22 0211 10/12/22 0247  WBC 8.7 7.8 8.3  RBC 3.33* 3.34* 3.01*  HGB 10.4* 10.3* 9.2*  HCT 31.9* 32.5* 28.6*  MCV 95.8 97.3 95.0  MCH 31.2 30.8 30.6  MCHC 32.6 31.7 32.2  RDW 16.4* 16.6* 16.4*  PLT 220 227 189   Thyroid  Recent Labs  Lab 10/09/22 0623  TSH 2.151  FREET4 1.22*    BNP Recent  Labs  Lab 10/08/22 0402 10/09/22 0623  BNP 1,156.1* 927.0*    DDimer No results for input(s): "DDIMER" in the last 168 hours.   Radiology    No results found.  Cardiac Studies   Cardiac catheterization 10/08/2022     Prox RCA lesion is 20% stenosed.   Prox RCA to Mid RCA lesion is 30% stenosed.   Mid LM to Dist LM lesion is 20% stenosed.   Mid LAD lesion is 20% stenosed.   Mid LAD to Dist LAD lesion is 50% stenosed.   Mid Cx to Dist Cx lesion is 30% stenosed.   Mild nonobstructive CAD with 20% smooth taper with mild calcification in the distal left main; 20% proximal and 50% mid LAD stenosis; large normal high OM/ramus like vessel; 30% smooth narrowing in the mid circumflex; and dominant RCA with 20 and 30% proximal to mid stenosis.   Very mild elevation of right heart pressure with PA systolic at 32.  Mean PA pressure 23.   Mechanical mitral valve with normal excursion.   LVEDP 24 mm.   RECOMMENDATION: Guideline directed medical therapy for HFrEF.  Medical therapy for nonobstructive CAD.  Resumption of warfarin tonight or tomorrow with heparinization with mechanical MVR until therapeutic.       Echocardiogram 09/08/2022    1. Left ventricular ejection fraction, by estimation, is 20 to 25%. The  left ventricle has severely decreased function. The left ventricle  demonstrates global hypokinesis. The left ventricular internal cavity size  was mildly dilated. Left ventricular  diastolic parameters are indeterminate.   2. Right ventricular systolic function is normal. The right ventricular  size is normal.   3. Left atrial size was severely dilated.   4. The mitral  valve has been repaired/replaced. No evidence of mitral  valve regurgitation. No evidence of mitral stenosis. Echo findings are  consistent with normal structure and function of the mitral valve  prosthesis.   5. Tricuspid valve regurgitation is moderate.   6. The aortic valve is tricuspid. There is mild  calcification of the  aortic valve. There is mild thickening of the aortic valve. Aortic valve  regurgitation is mild. Aortic valve sclerosis is present, with no evidence  of aortic valve stenosis. Aortic  regurgitation PHT measures 379 msec.   7. The inferior vena cava is normal in size with greater than 50%  respiratory variability, suggesting right atrial pressure of 3 mmHg.   Comparison(s): Prior images reviewed side by side.     Patient Profile     82 y.o. female  with mechanical mitral valve (repaired 2002, replacement 2007), history of systolic heart failure s/p [employer] biventricular ICD with initial improvement in LVEF but recent decline, hypertension, hyperlipidemia, CKD stage III, PAF, hypothyroidism, COPD, low BMI who presented to [facility] with abdominal discomfort and elevated LFTs probably related to acute systolic CHF. LVEF is severely depressed, but there is no obstructive CAD and RHC shows filling pressures back to near normal levels  Assessment & Plan    New "dry weight" seems to be 50 kg/90 lb. If labs show improving Na and creatinine trend she should be ready to go home. Resume furosemide 20 mg daily and spironolcatone 12.5 mg daily (half of previous dose) when labs closer to baseline and weight 90 lb.     For questions or updates, please contact [facility] Please consult [email or web address] for contact info under        Signed, Thurmon Fair, MD  10/14/2022, 9:38 AM

## 2022-10-14 NOTE — Progress Notes (Signed)
Mobility Specialist Progress Note:   10/14/22 1105  Mobility  Activity Ambulated independently in hallway  Level of Assistance Modified independent, requires aide device or extra time  Assistive Device Four wheel walker  Distance Ambulated (ft) 500 ft  Activity Response Tolerated well  Mobility Referral Yes  $Mobility charge 1 Mobility  Mobility Specialist Start Time (ACUTE ONLY) 1105  Mobility Specialist Stop Time (ACUTE ONLY) 1120  Mobility Specialist Time Calculation (min) (ACUTE ONLY) 15 min   Pt eager for mobility session. Required no physical assistance throughout. VSS on RA. Pt back in bed with all needs met.   Addison Lank Mobility Specialist Please contact via SecureChat or  Rehab office at [phone]

## 2022-10-14 NOTE — Progress Notes (Signed)
Heart Failure Stewardship Pharmacist Progress Note   PCP: Garlan Fillers, MD PCP-Cardiologist: Nanetta Batty, MD   HPI:  82 YO female with a PMH of mechanical mitral valve on warfarin, biventricular PPM/ICD, chronic diastolic heart failure, paroxysmal atrial fibrillation, COPD,HTN, HLD, CKD3a   Patient [facility] ED on 5/3 with complaints of abdominal pain. She also reports dyspnea this week requiring up to 3 pillows when she sleeps and felt her legs have been more edematous. She takes 20 mg of Lasix daily and missed just 1 dose the night prior to admission. CXR also showing vascular congestion. Recently her LVEF had declined further down to 20 to 25% after initially improving to 45 to 50% after CRT-D therapy in 2021. BNP 3914. Echo from 4/8 showing EF 20-25%, global hypokinesis, normal RV, severely dilated left atrial size, mild AVR, and moderate TVR. R/LHC from today (5/8) showing multiple mild stenoses and 50% stenosed mid LAD-dist LAD; PAP 23 mmHg, LVEDP 24 mmHg, CO 3.41, CI 2.45.    Pt reported an allergy to [place] ~6 months ago described as severe foot swelling that required admission to the ED. After speaking with patient again on 5/10, she reports that the allergy could have been related to Augmentin as opposed to [place].   Current HF Medications: Beta Blocker: metoprolol succinate 50 mg BID  Prior to admission HF Medications: Diuretic: furosemide 20 mg PO daily Beta blocker: metoprolol succinate 50 mg BID MRA: spironolactone 25 mg daily  Pertinent Lab Values: Serum creatinine 2.02, BUN 34, Potassium 4.8, Sodium 128, BNP 927, Magnesium 2.2  Vital Signs: Weight: 89 lbs (admission weight: 92 lbs) Blood pressure: 120/60s  Heart rate: 70s  I/O: incomplete yesterday; net -1.3L  Medication Assistance / Insurance Benefits Check: Does the patient have prescription insurance?  Yes Type of insurance plan: Medicare  Does the patient qualify for medication assistance  through manufacturers or grants?   Yes Eligible grants and/or patient assistance programs: None  Medication assistance applications in progress: None currently   Medication assistance applications approved: None Approved medication assistance renewals will be completed by: N/A  Outpatient Pharmacy:  Prior to admission outpatient pharmacy: [facility] Is the patient willing to use [facility] TOC pharmacy at discharge? Yes Is the patient willing to transition their outpatient pharmacy to utilize a [facility] outpatient pharmacy? No   Assessment: 1. Acute on chronic systolic CHF (LVEF 20-25%), due to NICM. NYHA class IV symptoms. - Scr improved 2.36>>2.02 after holding furosemide, Entresto, Jardiance, and spironolactone. Strict I's and O's. Keep K >4 and Mg >2.   - Consider adding spironolactone 12.5 mg daily and furosemide 20 mg daily tomorrow if Scr continues to improve  - Agree with continuing to hold furosemide, Jardiance, Entresto, and spironolactone given AKI - Continue Toprol-XL 50mg  BID - Continue hydralazine 25 mg TID for BP control while awaiting further renal improvement   Plan: 1) Medication changes recommended at this time: - Plan to restart spironolactone 12.5 mg daily as well as furosemide 20 mg daily tomorrow, 5/15 - Patient to follow-up with CHF TOC clinic in 2 weeks (5/29)  2) Patient assistance: - None  3)  Education  - Patient has been educated on current HF medications and potential additions to HF medication regimen - Patient verbalizes understanding that over the next few months, these medication doses may change and more medications may be added to optimize HF regimen - Patient has been educated on basic disease state pathophysiology and goals of therapy  [name], PharmD PGY1 Pharmacy Resident 5/14/20249:16 AM

## 2022-10-14 NOTE — Progress Notes (Signed)
Discharge instructions (including medications) discussed with and copy provided to patient/caregiver 

## 2022-10-14 NOTE — Progress Notes (Signed)
ANTICOAGULATION CONSULT NOTE  Pharmacy Consult for coumadin Indication:  mechanical valve  - mitral  Allergies  Allergen Reactions   Ace Inhibitors Cough   Lipitor [Atorvastatin Calcium] Other (See Comments)    Knee pain    Megace [Megestrol] Other (See Comments)    High blood pressure and blurred vision   Aspirin Other (See Comments)    Nephrologist said to not take this Update 10/07/22 - patient clarifies this recommendation was lumped in with a whole bunch of other medications at that time many years ago when nephrologist told her to stay away from Tylenol, vitamins, ASA. Denies prior allergy or allergic reaction to this   Clorazepate Dipotassium Other (See Comments)    Interacts with a drug being taken   Simvastatin Other (See Comments)    Muscle pain   Tiotropium Bromide Monohydrate Other (See Comments)    Dry mouth   Codeine Nausea And Vomiting   Tape Rash   Patient Measurements: Height: 5\' 2"  (157.5 cm) Weight: 40.5 kg (89 lb 4.8 oz) IBW/kg (Calculated) : 50.1  Vital Signs: Temp: 98.3 F (36.8 C) (05/14 0429) Temp Source: Oral (05/14 0429) BP: 142/64 (05/14 0942) Pulse Rate: 70 (05/14 0429)  Labs: Recent Labs    10/11/22 1731 10/12/22 0247 10/13/22 0238 10/14/22 0246  HGB  --  9.2*  --   --   HCT  --  28.6*  --   --   PLT  --  189  --   --   LABPROT  --  28.4* 25.3* 24.8*  INR  --  2.6* 2.3* 2.2*  HEPARINUNFRC 0.54 0.48  --   --   CREATININE  --  2.10* 2.36*  --     Estimated Creatinine Clearance: 12.2 mL/min (A) (by C-G formula based on SCr of 2.36 mg/dL (H)).  Medical History:  Assessment: 75 yof with a history of mechanical mitral valve and paroxysmal atrial fibrillation on warfarin PTA. Home dose is 5 mg (2.5 mg x 2) every Sun; 2.5 mg (2.5 mg x 1) all other days. Pharmacy consulted to transition to heparin when INR < 2.5 to allow for cardiac cath. INR now < 2.5. S/p post completed procedures   Coumadin re-initiated 5/8. INR up 2.6 >2.2 after lower  warfarin dose over the weekend> will boost tonight  Heparin stopped this weekend  per MD. No issues or bleeding noted.  Will bost warfarin tonight and then resume PTA dosing  Goal of Therapy:  INR 2.5-3.5 Monitor platelets by anticoagulation protocol: Yes   Plan:  Coumadin 5mg  po x 1 repeat  tonight then 2.5mg  daily Daily INR and CBC Monitor for s/sx of bleeding    Leota Sauers Pharm.D. CPP, BCPS Clinical Pharmacist [phone] 10/14/2022 10:34 AM

## 2022-10-14 NOTE — Discharge Instructions (Addendum)
Information on my medicine - Coumadin   (Warfarin)  This medication education was reviewed with me or my healthcare representative as part of my discharge preparation.   Why was Coumadin prescribed for you? Coumadin was prescribed for you because you have a blood clot or a medical condition that can cause an increased risk of forming blood clots. Blood clots can cause serious health problems by blocking the flow of blood to the heart, lung, or brain. Coumadin can prevent harmful blood clots from forming. As a reminder your indication for Coumadin is:  Blood Clot Prevention after Heart Valve Surgery  What test will check on my response to Coumadin? While on Coumadin (warfarin) you will need to have an INR test regularly to ensure that your dose is keeping you in the desired range. The INR (international normalized ratio) number is calculated from the result of the laboratory test called prothrombin time (PT).  If an INR APPOINTMENT HAS NOT ALREADY BEEN MADE FOR YOU please schedule an appointment to have this lab work done by your health care provider within 7 days. Your INR goal is a number between 2.5-3.5  What  do you need to  know  About  COUMADIN? Take Coumadin (warfarin) exactly as prescribed by your healthcare provider about the same time each day.  DO NOT stop taking without talking to the doctor who prescribed the medication.  Stopping without other blood clot prevention medication to take the place of Coumadin may increase your risk of developing a new clot or stroke.  Get refills before you run out.  What do you do if you miss a dose? If you miss a dose, take it as soon as you remember on the same day then continue your regularly scheduled regimen the next day.  Do not take two doses of Coumadin at the same time.  Important Safety Information A possible side effect of Coumadin (Warfarin) is an increased risk of bleeding. You should call your healthcare provider right away if you  experience any of the following: Bleeding from an injury or your nose that does not stop. Unusual colored urine (red or dark brown) or unusual colored stools (red or black). Unusual bruising for unknown reasons. A serious fall or if you hit your head (even if there is no bleeding).  Some foods or medicines interact with Coumadin (warfarin) and might alter your response to warfarin. To help avoid this: Eat a balanced diet, maintaining a consistent amount of Vitamin K. Notify your provider about major diet changes you plan to make. Avoid alcohol or limit your intake to 1 drink for women and 2 drinks for men per day. (1 drink is 5 oz. wine, 12 oz. beer, or 1.5 oz. liquor.)  Make sure that ANY health care provider who prescribes medication for you knows that you are taking Coumadin (warfarin).  Also make sure the healthcare provider who is monitoring your Coumadin knows when you have started a new medication including herbals and non-prescription products.  Coumadin (Warfarin)  Major Drug Interactions  Increased Warfarin Effect Decreased Warfarin Effect  Alcohol (large quantities) Antibiotics (esp. Septra/Bactrim, Flagyl, Cipro) Amiodarone (Cordarone) Aspirin (ASA) Cimetidine (Tagamet) Megestrol (Megace) NSAIDs (ibuprofen, naproxen, etc.) Piroxicam (Feldene) Propafenone (Rythmol SR) Propranolol (Inderal) Isoniazid (INH) Posaconazole (Noxafil) Barbiturates (Phenobarbital) Carbamazepine (Tegretol) Chlordiazepoxide (Librium) Cholestyramine (Questran) Griseofulvin Oral Contraceptives Rifampin Sucralfate (Carafate) Vitamin K   Coumadin (Warfarin) Major Herbal Interactions  Increased Warfarin Effect Decreased Warfarin Effect  Garlic Ginseng Ginkgo biloba Coenzyme Q10 Green tea [place]  wort    Coumadin (Warfarin) FOOD Interactions  Eat a consistent number of servings per week of foods HIGH in Vitamin K (1 serving =  cup)  Collards (cooked, or boiled & drained) Kale  (cooked, or boiled & drained) Mustard greens (cooked, or boiled & drained) Parsley *serving size only =  cup Spinach (cooked, or boiled & drained) Swiss chard (cooked, or boiled & drained) Turnip greens (cooked, or boiled & drained)  Eat a consistent number of servings per week of foods MEDIUM-HIGH in Vitamin K (1 serving = 1 cup)  Asparagus (cooked, or boiled & drained) Broccoli (cooked, boiled & drained, or raw & chopped) Brussel sprouts (cooked, or boiled & drained) *serving size only =  cup Lettuce, raw (green leaf, endive, romaine) Spinach, raw Turnip greens, raw & chopped   These websites have more information on Coumadin (warfarin):  http://www.king-russell.com/; https://www.hines.net/;

## 2022-10-14 NOTE — Discharge Summary (Signed)
Physician Discharge Summary  Densie Zingsheim ZOX:[id number] DOB: 1940-11-28 DOA: 10/03/2022  PCP: Garlan Fillers, MD  Admit date: 10/03/2022 Discharge date: 10/14/2022  Time spent: 35 minutes  Recommendations for Outpatient Follow-up:  CHF TOC clinic in 2 weeks, please check BMP at follow-up Restart Lasix 20 Mg daily and Aldactone 12.5 Mg daily tomorrow Cards Dr. Royann Shivers in 1 month   Discharge Diagnoses:    Acute on chronic combined systolic and diastolic CHF (congestive heart failure) (HCC)   Biventricular ICD (implantable cardioverter-defibrillator) in place   History of mitral valve replacement with mechanical valve   Paroxysmal atrial fibrillation (HCC)   Hypothyroidism   Hypertension   Mixed hyperlipidemia   COPD (chronic obstructive pulmonary disease) (HCC)   Supratherapeutic INR   Abdominal pain   Transaminitis   CAD in native artery   Discharge Condition: Improved  Diet recommendation: Low-sodium, heart healthy  Filed Weights   10/12/22 0526 10/13/22 0458 10/14/22 0429  Weight: 40.4 kg 43.1 kg 40.5 kg    History of present illness:  82/F with history of chronic systolic CHF, mechanical mitral valve replacement on Coumadin, COPD, hypertension, paroxysmal A-fib presented to the ED with orthopnea, exertional dyspnea and abdominal pain.   Hospital Course:   Acute on chronic combined systolic and diastolic CHF -admitted with orthopnea PND and swelling -Last echo 07/2022 with EF of 20 to 25%, normal RV, mitral valve unremarkable -Has been diuresed with IV Lasix, down 12 pounds, clinically appears euvolemic -Right and left heart cath 5/8 with mild nonobstructive CAD, mild elevation of right heart pressures -Cards following, volume status has improved however creatinine continues to trend up after starting Entresto 5/11, Entresto Aldactone and Jardiance discontinued. -Now creatinine slowly starting to trend down, discussed with cardiology, will start Lasix and  low-dose Aldactone tomorrow -discharged home in stable condition has follow-up in CHF clinic in 2 weeks, follow-up with Dr. Royann Shivers in 1 month   Abnormal LFTs -Secondary to passive hepatic congestion, in the background of liver cirrhosis, no obstructive pathology -Was also initially treated for Tylenol toxicity -undetectable Tylenol level -Improving   Suspected cirrhosis -?  Cardiac cirrhosis versus NAFLD -See above, now holding all diuretics   Normocytic anemia -anemia panel w/ Iron defi, given IV iron   COPD (chronic obstructive pulmonary disease) (HCC) - Stable and not in exacerbation - Continue home bronchodilator   Mixed hyperlipidemia -continue Vascepa -holding statin    Hypertension - Continue beta-blocker and spironolactone   Hypothyroidism - Continue levothyroxine   Paroxysmal atrial fibrillation (HCC) - Continue beta-blocker   History of mitral valve replacement with mechanical valve -INR is therapeutic today, discontinue heparin, continue warfarin   Biventricular ICD (implantable cardioverter-defibrillator) in place Noted   Mood disorder -Stable, continue Wellbutrin and Xanax per home regimen  Discharge Exam: Vitals:   10/14/22 0942 10/14/22 1310  BP: (!) 142/64 (!) 131/51  Pulse:    Resp: 16   Temp:    SpO2:     AAOx3, no distress HEENT: No JVD CVS: S1-S2, regular rhythm, metallic click Lungs: Clear bilaterally Abdomen: Soft, nontender, bowel sounds present Extremities: No edema  Skin: no rashes on exposed skin   Discharge Instructions   Discharge Instructions     Diet - low sodium heart healthy   Complete by: As directed    Increase activity slowly   Complete by: As directed       Allergies as of 10/14/2022       Reactions   Ace Inhibitors Cough  Lipitor [atorvastatin Calcium] Other (See Comments)   Knee pain   Megace [megestrol] Other (See Comments)   High blood pressure and blurred vision   Aspirin Other (See Comments)    Nephrologist said to not take this Update 10/07/22 - patient clarifies this recommendation was lumped in with a whole bunch of other medications at that time many years ago when nephrologist told her to stay away from Tylenol, vitamins, ASA. Denies prior allergy or allergic reaction to this   Clorazepate Dipotassium Other (See Comments)   Interacts with a drug being taken   Simvastatin Other (See Comments)   Muscle pain   Tiotropium Bromide Monohydrate Other (See Comments)   Dry mouth   Codeine Nausea And Vomiting   Tape Rash        Medication List     STOP taking these medications    phenylephrine 10 MG Tabs tablet Commonly known as: SUDAFED PE       TAKE these medications    acetaminophen 650 MG CR tablet Commonly known as: TYLENOL Take 650 mg by mouth every 8 (eight) hours as needed for pain.   allopurinol 300 MG tablet Commonly known as: ZYLOPRIM Take 300 mg by mouth every evening.   ALPRAZolam 0.25 MG tablet Commonly known as: XANAX Take 0.25 mg by mouth at bedtime as needed for anxiety or sleep.   antiseptic oral rinse Liqd 15 mLs by Mouth Rinse route as needed for dry mouth.   buPROPion 150 MG 12 hr tablet Commonly known as: WELLBUTRIN SR Take 150 mg by mouth in the morning.   esomeprazole 40 MG capsule Commonly known as: NEXIUM TAKE 1 CAPSULE BY MOUTH EVERY DAY BEFORE BREAKFAST What changed: See the new instructions.   ferrous sulfate 325 (65 FE) MG tablet Take 1 tablet (325 mg total) by mouth daily with breakfast. Start taking on: Oct 15, 2022   fluticasone 50 MCG/ACT nasal spray Commonly known as: FLONASE Place 1 spray into both nostrils as needed for allergies.   furosemide 40 MG tablet Commonly known as: LASIX Take 0.5 tablets (20 mg total) by mouth daily. Start taking on: Oct 15, 2022 What changed:  See the new instructions. Another medication with the same name was removed. Continue taking this medication, and follow the directions you see  here.   hydrALAZINE 25 MG tablet Commonly known as: APRESOLINE Take 1 tablet (25 mg total) by mouth 3 (three) times daily.   icosapent Ethyl 1 g capsule Commonly known as: VASCEPA Take 1 capsule (1 g total) by mouth 2 (two) times daily.   ipratropium 0.06 % nasal spray Commonly known as: ATROVENT Place 2 sprays into both nostrils 2 (two) times daily.   isosorbide mononitrate 30 MG 24 hr tablet Commonly known as: IMDUR Take 0.5 tablets (15 mg total) by mouth daily.   levothyroxine 75 MCG tablet Commonly known as: SYNTHROID Take 75 mcg by mouth daily before breakfast.   memantine 5 MG tablet Commonly known as: NAMENDA Take 5 mg by mouth every morning.   metoCLOPramide 5 MG tablet Commonly known as: REGLAN Take 5 mg by mouth 2 (two) times daily as needed for nausea or vomiting.   metoprolol succinate 50 MG 24 hr tablet Commonly known as: TOPROL-XL Take 1 tablet (50 mg total) by mouth 2 (two) times daily. Take with or immediately following a meal.   multivitamin with minerals Tabs tablet Take 1 tablet by mouth daily.   pravastatin 40 MG tablet Commonly known as: PRAVACHOL Take 40  mg at bedtime by mouth.   ProAir HFA 108 (90 Base) MCG/ACT inhaler Generic drug: albuterol Inhale 1-2 puffs into the lungs every 6 (six) hours as needed for wheezing.   Senna 15 MG Tabs Take 30 mg by mouth at bedtime.   spironolactone 25 MG tablet Commonly known as: ALDACTONE Take 0.5 tablets (12.5 mg total) by mouth daily. Start taking on: Oct 15, 2022 What changed: how much to take   Voltaren 1 % Gel Generic drug: diclofenac Sodium Apply 2-4 g topically in the morning and at bedtime.   warfarin 2.5 MG tablet Commonly known as: COUMADIN Take as directed. If you are unsure how to take this medication, talk to your nurse or doctor. Original instructions: TAKE 1 TO 2 TABLETS BY MOUTH DAILY AS DIRECTED BY THE COUMADIN CLINIC What changed: See the new instructions.       Allergies   Allergen Reactions   Ace Inhibitors Cough   Lipitor [Atorvastatin Calcium] Other (See Comments)    Knee pain    Megace [Megestrol] Other (See Comments)    High blood pressure and blurred vision   Aspirin Other (See Comments)    Nephrologist said to not take this Update 10/07/22 - patient clarifies this recommendation was lumped in with a whole bunch of other medications at that time many years ago when nephrologist told her to stay away from Tylenol, vitamins, ASA. Denies prior allergy or allergic reaction to this   Clorazepate Dipotassium Other (See Comments)    Interacts with a drug being taken   Simvastatin Other (See Comments)    Muscle pain   Tiotropium Bromide Monohydrate Other (See Comments)    Dry mouth   Codeine Nausea And Vomiting   Tape Rash    Follow-up Information     [facility]. Go in 18 day(s).   Specialty: Cardiology Why: Hosptial follow up 10/29/2022 @ 3 pm PLEASE bring a current medication list to appointment FREE valet parking, Entrance C, off [employer] information: [address] [phone] mc [place] [address] [phone]                 The results of significant diagnostics from this hospitalization (including imaging, microbiology, ancillary and laboratory) are listed below for reference.    Significant Diagnostic Studies: CARDIAC CATHETERIZATION  Result Date: 10/08/2022   Prox RCA lesion is 20% stenosed.   Prox RCA to Mid RCA lesion is 30% stenosed.   Mid LM to Dist LM lesion is 20% stenosed.   Mid LAD lesion is 20% stenosed.   Mid LAD to Dist LAD lesion is 50% stenosed.   Mid Cx to Dist Cx lesion is 30% stenosed. Mild nonobstructive CAD with 20% smooth taper with mild calcification in the distal left main; 20% proximal and 50% mid LAD stenosis; large normal high OM/ramus like vessel; 30% smooth narrowing in the mid circumflex; and dominant RCA with 20 and 30%  proximal to mid stenosis. Very mild elevation of right heart pressure with PA systolic at 32.  Mean PA pressure 23. Mechanical mitral valve with normal excursion. LVEDP 24 mm. RECOMMENDATION: Guideline directed medical therapy for HFrEF.  Medical therapy for nonobstructive CAD.  Resumption of warfarin tonight or tomorrow with heparinization with mechanical MVR until therapeutic.   DG Abd Portable 2V  Result Date: 10/03/2022 CLINICAL DATA:  Abdominal pain EXAM: PORTABLE ABDOMEN - 2 VIEW COMPARISON:  CT abdomen and pelvis 03/01/2021 FINDINGS: There is gaseous distention of  small bowel without dilatation. Air seen throughout the colon. There are no suspicious calcifications. There surgical clips in the right abdomen. The heart is enlarged. There is likely a small right pleural effusion. There are postsurgical changes in the chest. IMPRESSION: 1. Gaseous distention of small bowel without dilatation. No bowel obstruction. 2. Cardiomegaly with small right pleural effusion. Electronically Signed   By: Darliss Cheney M.D.   On: 10/03/2022 22:11   DG Chest 1 View  Result Date: 10/03/2022 CLINICAL DATA:  Shortness of breath, abdominal pain EXAM: CHEST  1 VIEW COMPARISON:  06/20/2022 FINDINGS: Left AICD remains in place, unchanged. Prior median sternotomy and valve replacement. Cardiomegaly, vascular congestion. Bilateral airspace disease, favor edema. No effusions or acute bony abnormality. IMPRESSION: Cardiomegaly with bilateral airspace disease, likely edema. Electronically Signed   By: Charlett Nose M.D.   On: 10/03/2022 22:09   US Abdomen Limited RUQ (LIVER/GB)  Result Date: 10/03/2022 CLINICAL DATA:  Right upper quadrant abdominal pain for 2 weeks EXAM: ULTRASOUND ABDOMEN LIMITED RIGHT UPPER QUADRANT COMPARISON:  CT abdomen pelvis 03/01/2021 FINDINGS: Gallbladder: Status post cholecystectomy Common bile duct: Diameter: 2 mm Liver: Parenchymal echogenicity: Within normal limits Contours: Mildly nodular Lesions:  None Portal vein: Patent.  Hepatopetal flow Other: 1.1 and 0.9 cm simple cyst in the upper pole the right kidney do not require dedicated imaging follow-up. Right pleural effusion is partially visualized. IMPRESSION: Nodular hepatic contour, suspicious for cirrhosis. Electronically Signed   By: Acquanetta Belling M.D.   On: 10/03/2022 16:49    Microbiology: No results found for this or any previous visit (from the past 240 hour(s)).   Labs: Basic Metabolic Panel: Recent Labs  Lab 10/08/22 0402 10/08/22 1116 10/10/22 0223 10/11/22 0211 10/12/22 0247 10/13/22 0238 10/14/22 0246  NA 131*   < > 130* 134* 130* 125* 128*  K 4.3   < > 4.8 4.9 4.8 5.1 4.8  CL 92*   < > 97* 99 96* 95* 95*  CO2 29   < > 22 26 24 24 23   GLUCOSE 89   < > 124* 111* 107* 89 86  BUN 32*   < > 34* 25* 30* 31* 34*  CREATININE 1.60*   < > 1.47* 1.64* 2.10* 2.36* 2.02*  CALCIUM 8.7*   < > 9.0 9.5 8.9 9.0 9.2  MG 2.2  --   --   --   --   --   --    < > = values in this interval not displayed.   Liver Function Tests: Recent Labs  Lab 10/08/22 0402 10/09/22 0623  AST 52* 38  ALT 121* 85*  ALKPHOS 106 100  BILITOT 0.5 0.7  PROT 6.3* 5.9*  ALBUMIN 2.8* 2.8*   No results for input(s): "LIPASE", "AMYLASE" in the last 168 hours. No results for input(s): "AMMONIA" in the last 168 hours. CBC: Recent Labs  Lab 10/08/22 0402 10/08/22 1116 10/08/22 1128 10/09/22 0623 10/10/22 0223 10/11/22 0211 10/12/22 0247  WBC 7.2  --   --  7.1 8.7 7.8 8.3  NEUTROABS 5.2  --   --  5.4  --   --   --   HGB 10.8*   < > 10.5* 9.7* 10.4* 10.3* 9.2*  HCT 33.5*   < > 31.0* 31.1* 31.9* 32.5* 28.6*  MCV 95.2  --   --  98.4 95.8 97.3 95.0  PLT 248  --   --  205 220 227 189   < > = values in this interval not  displayed.   Cardiac Enzymes: No results for input(s): "CKTOTAL", "CKMB", "CKMBINDEX", "TROPONINI" in the last 168 hours. BNP: BNP (last 3 results) Recent Labs    10/07/22 0259 10/08/22 0402 10/09/22 0623  BNP 1,164.3*  1,156.1* 927.0*    ProBNP (last 3 results) No results for input(s): "PROBNP" in the last 8760 hours.  CBG: No results for input(s): "GLUCAP" in the last 168 hours.     Signed:  Zannie Cove MD.  [facility] 10/14/2022, 1:40 PM

## 2022-10-14 NOTE — Care Management Important Message (Signed)
Important Message  Patient Details  Name: [name] MRN: [id number] Date of Birth: [birth year]   Medicare Important Message Given:  Yes     Renie Ora 10/14/2022, 10:43 AM

## 2022-10-15 ENCOUNTER — Telehealth: Payer: Self-pay | Admitting: *Deleted

## 2022-10-15 NOTE — Telephone Encounter (Signed)
Pt called and stated she had been discharged from the Hospital. She stated she had new meds, and went over those and they are on the medlist (ferrous sulfate, apresoline, imdur, spironolactone).She is aware these meds do not interact with warfarin. Asked about her warfarin dose and she states she resumed warfarin 1 tablet daily except for 2 tablets on Sundays, Wednesdays and Fridays. Advised she should be on the lower dose that we advised her on 10/01/22, which is warfarin 1 tablet daily except for 2 tablets on Sundays. She states she was told her liver labs are up. Advised that since her liver enzymes are elevated to take 1 tablet of warfarin daily until appt on 5/21 and she verbalized understanding.

## 2022-10-16 NOTE — Progress Notes (Signed)
Remote ICD transmission.   

## 2022-10-21 ENCOUNTER — Ambulatory Visit: Payer: Medicare Other | Attending: Cardiovascular Disease | Admitting: *Deleted

## 2022-10-21 DIAGNOSIS — Z952 Presence of prosthetic heart valve: Secondary | ICD-10-CM

## 2022-10-21 DIAGNOSIS — Z7901 Long term (current) use of anticoagulants: Secondary | ICD-10-CM

## 2022-10-21 LAB — POCT INR: INR: 1.9 — AB (ref 2.0–3.0)

## 2022-10-21 NOTE — Patient Instructions (Signed)
Description   TODAY TAKE 2 TABLETS OF WARFARIN THEN CONTINUE TAKING WARFARIN 1 TABLET DAILY EXCEPT 2 TABLETS ON SUNDAYS.  REPORT TO ER WITH ANY BLEEDING, FALLS, ACCIDENTS Recheck INR in 1 week. Coumadin Clinic [phone].  Stay consistent with 1-2 helping of greens per week.

## 2022-10-25 ENCOUNTER — Other Ambulatory Visit: Payer: Self-pay | Admitting: Cardiovascular Disease

## 2022-10-29 ENCOUNTER — Ambulatory Visit: Payer: Medicare Other

## 2022-10-29 ENCOUNTER — Encounter (HOSPITAL_COMMUNITY): Payer: Self-pay

## 2022-10-29 ENCOUNTER — Ambulatory Visit
Admission: RE | Admit: 2022-10-29 | Discharge: 2022-10-29 | Disposition: A | Discharge status: Home / Self Care | Payer: Medicare Other | Source: Ambulatory Visit | Attending: Cardiology | Admitting: Cardiology

## 2022-10-29 VITALS — BP 130/82 | HR 76 | Wt 93.8 lb

## 2022-10-29 DIAGNOSIS — Z79899 Other long term (current) drug therapy: Secondary | ICD-10-CM | POA: Diagnosis not present

## 2022-10-29 DIAGNOSIS — I428 Other cardiomyopathies: Secondary | ICD-10-CM | POA: Insufficient documentation

## 2022-10-29 DIAGNOSIS — I480 Paroxysmal atrial fibrillation: Secondary | ICD-10-CM | POA: Insufficient documentation

## 2022-10-29 DIAGNOSIS — Z952 Presence of prosthetic heart valve: Secondary | ICD-10-CM | POA: Insufficient documentation

## 2022-10-29 DIAGNOSIS — J4489 Other specified chronic obstructive pulmonary disease: Secondary | ICD-10-CM | POA: Diagnosis not present

## 2022-10-29 DIAGNOSIS — Z7901 Long term (current) use of anticoagulants: Secondary | ICD-10-CM | POA: Insufficient documentation

## 2022-10-29 DIAGNOSIS — Z5181 Encounter for therapeutic drug level monitoring: Secondary | ICD-10-CM

## 2022-10-29 DIAGNOSIS — I5042 Chronic combined systolic (congestive) and diastolic (congestive) heart failure: Secondary | ICD-10-CM | POA: Diagnosis present

## 2022-10-29 DIAGNOSIS — E039 Hypothyroidism, unspecified: Secondary | ICD-10-CM | POA: Diagnosis not present

## 2022-10-29 DIAGNOSIS — R54 Age-related physical debility: Secondary | ICD-10-CM | POA: Diagnosis not present

## 2022-10-29 DIAGNOSIS — I2510 Atherosclerotic heart disease of native coronary artery without angina pectoris: Secondary | ICD-10-CM | POA: Insufficient documentation

## 2022-10-29 DIAGNOSIS — I130 Hypertensive heart and chronic kidney disease with heart failure and stage 1 through stage 4 chronic kidney disease, or unspecified chronic kidney disease: Secondary | ICD-10-CM | POA: Diagnosis not present

## 2022-10-29 DIAGNOSIS — N1832 Chronic kidney disease, stage 3b: Secondary | ICD-10-CM | POA: Diagnosis not present

## 2022-10-29 LAB — BASIC METABOLIC PANEL
Anion gap: 10 (ref 5–15)
BUN: 16 mg/dL (ref 8–23)
CO2: 25 mmol/L (ref 22–32)
Calcium: 8.6 mg/dL — ABNORMAL LOW (ref 8.9–10.3)
Chloride: 99 mmol/L (ref 98–111)
Creatinine, Ser: 1.22 mg/dL — ABNORMAL HIGH (ref 0.44–1.00)
GFR, Estimated: 45 mL/min — ABNORMAL LOW (ref >60)
Glucose, Bld: 83 mg/dL (ref 70–99)
Potassium: 4.1 mmol/L (ref 3.5–5.1)
Sodium: 134 mmol/L — ABNORMAL LOW (ref 135–145)

## 2022-10-29 LAB — POCT INR: INR: 5.6 — AB (ref 2.0–3.0)

## 2022-10-29 LAB — BRAIN NATRIURETIC PEPTIDE: B Natriuretic Peptide: 1309.4 pg/mL — ABNORMAL HIGH (ref 0.0–100.0)

## 2022-10-29 NOTE — Progress Notes (Signed)
[facility] Transitions of Care Clinic Heart Failure Pharmacist Encounter  PCP: Garlan Fillers, MD PCP-Cardiologist: Nanetta Batty, MD  HPI:   82 YO female with a PMH of mechanical mitral valve on warfarin, biventricular PPM/ICD, chronic diastolic heart failure, paroxysmal atrial fibrillation, COPD,HTN, HLD, CKD3a.    Patient presented to [facility] ED on 5/3 with complaints of abdominal pain. She also reported dyspnea that week requiring up to 3 pillows when she slept and felt her legs had been more edematous. She takes 20 mg of Lasix daily and missed just 1 dose the night prior to admission. CXR also showed vascular congestion. Recently her LVEF had declined further to 20 to 25% after initially improving to 45 to 50% after CRT-D therapy in 2021. BNP 3914. Echo from 4/8 showed EF 20-25%, global hypokinesis, normal RV, severely dilated left atrial size, mild AVR, and moderate TVR. R/LHC from 5/8 showed multiple mild stenoses and 50% stenosed mid LAD-dist LAD; PAP 23 mmHg, LVEDP 24 mmHg, CO 3.41, CI 2.45. Patient reported an allergy to [place] ~6 months ago described as severe foot swelling that required admission to the ED. After speaking with patient on 5/10, she reported that the allergy could have been related to Augmentin as opposed to [place]. She did not experience these side effects with Jardiance during this admission. She was discharged from the hospital on 5/14.    Patient followed-up with cardiology on 5/21 for an anticoagulation visit regarding her warfarin dosing--no changes were made to her current HF regimen.  Today, [name] presents to the Heart Failure [facility] for follow up.   Shortness of breath/dyspnea on exertion? No, but does report some chest pain at night and is fatigued during the day  Orthopnea/PND? No Edema? Not evident in legs but weight is up and thoracic impedance indicates increasing fluid Lightheadedness/dizziness? Sometimes when she is  hungry--drinks Ensure and this provides relief Daily weights at home? Yes Blood pressure/heart rate monitoring at home? Yes Following low-sodium/fluid-restricted diet? Yes Taking medications as prescribed? Yes  HF Medications: Diuretic: furosemide 20 mg daily Beta Blocker: metoprolol succinate 50 mg daily MRA: spironolactone 12.5 mg daily  Has the patient been experiencing any side effects to the medications prescribed? No  Does the patient have any problems obtaining medications due to transportation or finances? No  Understanding of regimen: good Understanding of indications: good Potential of compliance: excellent Patient understands to avoid NSAIDs. Patient understands to avoid decongestants.   Pertinent Lab Values: BMP/BNP pending   Vital Signs: Weight: 93 lbs (discharge weight: 90 lbs) Blood pressure: 130/82  Heart rate: 76   Medication Assistance / Insurance Benefits Check: Does the patient have prescription insurance?  Yes Type of insurance plan: Medicare  Does the patient qualify for medication assistance through manufacturers or grants?   Yes Eligible grants and/or patient assistance programs: None Medication assistance applications in progress: None  Medication assistance applications approved: None Approved medication assistance renewals will be completed by: N/A  Outpatient Pharmacy:  Current outpatient pharmacy: [facility] Was the [facility] pharmacy used to supply discharge medications? yes  If TOC pharmacy was used, were the refills transferred out to current pharmacy yet? Needs refills for spironolactone   Is the patient willing to transition their outpatient pharmacy to utilize a [facility] outpatient pharmacy with or without mail order?   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 NYHA class II symptoms. - F/u BMP today - Consider adding either Jardiance  10 mg daily OR increasing spironolactone to 12.5 mg daily if BMP is  stable  Plan: 1) Medication changes: - Deferring medication refills to Dr. Royann Shivers on 6/17 appointment  - Consider adding either Jardiance 10 mg daily OR increasing spironolactone to 25 mg daily if BMP stable  2) Patient Assistance: - Saralyn Pilar - $47  3) Follow up: - Next appointment with Dr. Royann Shivers on 11/17/22   [name], PharmD PGY1 Pharmacy Resident 5/29/20248:17 AM

## 2022-10-29 NOTE — Patient Instructions (Signed)
EKG done today.  Labs done today. We will contact you only if your labs are abnormal.  No medication changes were made. Please continue all current medications as prescribed.  Thank you for allowing us to provide your heart failure care after your recent hospitalization. Please follow-up with General Cardiology.     

## 2022-10-29 NOTE — Progress Notes (Signed)
HEART & VASCULAR TRANSITION OF CARE CONSULT NOTE     Referring Physician: Primary Care: Primary Cardiologist:  HPI: Referred to clinic by *** for heart failure consultation.   Cardiac Testing    Review of Systems: [y] = yes, [ ]  = no   General: Weight gain [ ] ; Weight loss [ ] ; Anorexia [ ] ; Fatigue [ ] ; Fever [ ] ; Chills [ ] ; Weakness [ ]   Cardiac: Chest pain/pressure [ ] ; Resting SOB [ ] ; Exertional SOB [ ] ; Orthopnea [ ] ; Pedal Edema [ ] ; Palpitations [ ] ; Syncope [ ] ; Presyncope [ ] ; Paroxysmal nocturnal dyspnea[ ]   Pulmonary: Cough [ ] ; Wheezing[ ] ; Hemoptysis[ ] ; Sputum [ ] ; Snoring [ ]   GI: Vomiting[ ] ; Dysphagia[ ] ; Melena[ ] ; Hematochezia [ ] ; Heartburn[ ] ; Abdominal pain [ ] ; Constipation [ ] ; Diarrhea [ ] ; BRBPR [ ]   GU: Hematuria[ ] ; Dysuria [ ] ; Nocturia[ ]   Vascular: Pain in legs with walking [ ] ; Pain in feet with lying flat [ ] ; Non-healing sores [ ] ; Stroke [ ] ; TIA [ ] ; Slurred speech [ ] ;  Neuro: Headaches[ ] ; Vertigo[ ] ; Seizures[ ] ; Paresthesias[ ] ;Blurred vision [ ] ; Diplopia [ ] ; Vision changes [ ]   Ortho/Skin: Arthritis [ ] ; Joint pain [ ] ; Muscle pain [ ] ; Joint swelling [ ] ; Back Pain [ ] ; Rash [ ]   Psych: Depression[ ] ; Anxiety[ ]   Heme: Bleeding problems [ ] ; Clotting disorders [ ] ; Anemia [ ]   Endocrine: Diabetes [ ] ; Thyroid dysfunction[ ]    Past Medical History:  Diagnosis Date   Allergy    seasonal   Anemia    Anxiety    Arthritis    Asthma    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 Protecta   Biventricular ICD (implantable cardioverter-defibrillator) in place    with CRT   Blood transfusion without reported diagnosis    Bursitis    Cataract    RIGHT EYE   CHF (congestive heart failure) (HCC)    Chronic kidney disease    Colon polyp    adenomatous   Complication of anesthesia    patient stated that had difficulty getting the breathing tube removed, patient said that she stopped breathing and HR  dropped to 10  patient then woke up and started breathing pateint stated no longer than one minute; re-intubated in PACU following cholecystectomy 10/28/13   COPD (chronic obstructive pulmonary disease) (HCC)    Depression    Diverticulosis    Dysrhythmia    Fibromyalgia    GERD (gastroesophageal reflux disease)    Gout    H/O mitral valve replacement 2002, 2007   Heart murmur    Hemorrhoids    Hyperlipidemia    Hypertension    Hypothyroidism    IBS (irritable bowel syndrome)    MVP (mitral valve prolapse)    NAUSEA AND VOMITING 08/01/2009   Neuromuscular disorder (HCC)    fibromyalgia   Pacemaker    Peptic ulcer disease     Current Outpatient Medications  Medication Sig Dispense Refill   acetaminophen (TYLENOL) 650 MG CR tablet Take 650 mg by mouth every 8 (eight) hours as needed for pain.     allopurinol (ZYLOPRIM) 300 MG tablet Take 300 mg by mouth every evening.      ALPRAZolam (XANAX) 0.25 MG tablet Take 0.25 mg by mouth at bedtime as needed for anxiety or sleep.      antiseptic oral rinse (BIOTENE) LIQD 15 mLs by Mouth Rinse route  as needed for dry mouth.     buPROPion (WELLBUTRIN SR) 150 MG 12 hr tablet Take 150 mg by mouth in the morning.     esomeprazole (NEXIUM) 40 MG capsule TAKE 1 CAPSULE BY MOUTH EVERY DAY BEFORE BREAKFAST (Patient taking differently: Take 40 mg by mouth daily before breakfast.) 90 capsule 0   ferrous sulfate 325 (65 FE) MG tablet Take 1 tablet (325 mg total) by mouth daily with breakfast. 100 tablet 0   fluticasone (FLONASE) 50 MCG/ACT nasal spray Place 1 spray into both nostrils as needed for allergies.     furosemide (LASIX) 40 MG tablet Take 0.5 tablets (20 mg total) by mouth daily. 30 tablet    hydrALAZINE (APRESOLINE) 25 MG tablet Take 1 tablet (25 mg total) by mouth 3 (three) times daily. 90 tablet 0   icosapent Ethyl (VASCEPA) 1 g capsule Take 1 capsule (1 g total) by mouth 2 (two) times daily. 60 capsule 11   ipratropium (ATROVENT) 0.06 %  nasal spray Place 2 sprays into both nostrils 2 (two) times daily.     isosorbide mononitrate (IMDUR) 30 MG 24 hr tablet Take 0.5 tablets (15 mg total) by mouth daily. 30 tablet 0   levothyroxine (SYNTHROID) 75 MCG tablet Take 75 mcg by mouth daily before breakfast.     memantine (NAMENDA) 5 MG tablet Take 5 mg by mouth every morning.     metoCLOPramide (REGLAN) 5 MG tablet Take 5 mg by mouth 2 (two) times daily as needed for nausea or vomiting.     metoprolol succinate (TOPROL-XL) 50 MG 24 hr tablet Take 1 tablet (50 mg total) by mouth 2 (two) times daily. Take with or immediately following a meal. 180 tablet 3   Multiple Vitamin (MULTIVITAMIN WITH MINERALS) TABS tablet Take 1 tablet by mouth daily.     pravastatin (PRAVACHOL) 40 MG tablet Take 40 mg at bedtime by mouth.      PROAIR HFA 108 (90 BASE) MCG/ACT inhaler Inhale 1-2 puffs into the lungs every 6 (six) hours as needed for wheezing.     Sennosides (SENNA) 15 MG TABS Take 30 mg by mouth at bedtime.     spironolactone (ALDACTONE) 25 MG tablet Take 0.5 tablets (12.5 mg total) by mouth daily.     VOLTAREN 1 % GEL Apply 2-4 g topically in the morning and at bedtime.     warfarin (COUMADIN) 2.5 MG tablet TAKE 1 TO 2 TABLETS BY MOUTH DAILY AS DIRECTED BY THE COUMADIN CLINIC 45 tablet 3   Current Facility-Administered Medications  Medication Dose Route Frequency Provider Last Rate Last Admin   0.9 %  sodium chloride infusion  500 mL Intravenous Continuous Hilarie Fredrickson, MD        Allergies  Allergen Reactions   Ace Inhibitors Cough   Lipitor [Atorvastatin Calcium] Other (See Comments)    Knee pain    Megace [Megestrol] Other (See Comments)    High blood pressure and blurred vision   Aspirin Other (See Comments)    Nephrologist said to not take this Update 10/07/22 - patient clarifies this recommendation was lumped in with a whole bunch of other medications at that time many years ago when nephrologist told her to stay away from Tylenol,  vitamins, ASA. Denies prior allergy or allergic reaction to this   Clorazepate Dipotassium Other (See Comments)    Interacts with a drug being taken   Simvastatin Other (See Comments)    Muscle pain   Tiotropium Bromide Monohydrate Other (  See Comments)    Dry mouth   Codeine Nausea And Vomiting   Tape Rash      Social History   Socioeconomic History   Marital status: Married    Spouse name: [name]   Number of children: 4   Years of education: 12   Highest education level: Not on file  Occupational History   Occupation: retired  Tobacco Use   Smoking status: Former    Types: Cigarettes    Quit date: 06/02/2001    Years since quitting: 21.4   Smokeless tobacco: Never  Vaping Use   Vaping Use: Never used  Substance and Sexual Activity   Alcohol use: Yes    Alcohol/week: 0.0 standard drinks of alcohol    Comment: social-1 every 6 months   Drug use: No   Sexual activity: Not on file  Other Topics Concern   Not on file  Social History Narrative   Drinks very little caffeine    Social Determinants of Health   Financial Resource Strain: Low Risk  (10/09/2022)   Overall Financial Resource Strain (CARDIA)    Difficulty of Paying Living Expenses: Not hard at all  Food Insecurity: No Food Insecurity (10/03/2022)   Hunger Vital Sign    Worried About Running Out of Food in the Last Year: Never true    Ran Out of Food in the Last Year: Never true  Transportation Needs: No Transportation Needs (10/03/2022)   PRAPARE - Administrator, Civil Service (Medical): No    Lack of Transportation (Non-Medical): No  Physical Activity: Not on file  Stress: Not on file  Social Connections: Not on file  Intimate Partner Violence: Not At Risk (10/03/2022)   Humiliation, Afraid, Rape, and Kick questionnaire    Fear of Current or Ex-Partner: No    Emotionally Abused: No    Physically Abused: No    Sexually Abused: No      Family History  Problem Relation Age of Onset   Breast  cancer Sister        multiple   Uterine cancer Sister        multiple   Heart disease Mother    Heart attack Mother    Heart disease Father    Kidney disease Father    Heart attack Father    Heart disease Sister    Crohn's disease Sister    Heart disease Son    Colon cancer Neg Hx    Stomach cancer Neg Hx     There were no vitals filed for this visit.  PHYSICAL EXAM: General:  Well appearing. No respiratory difficulty HEENT: normal Neck: supple. no JVD. Carotids 2+ bilat; no bruits. No lymphadenopathy or thryomegaly appreciated. Cor: PMI nondisplaced. Regular rate & rhythm. No rubs, gallops or murmurs. Lungs: clear Abdomen: soft, nontender, nondistended. No hepatosplenomegaly. No bruits or masses. Good bowel sounds. Extremities: no cyanosis, clubbing, rash, edema Neuro: alert & oriented x 3, cranial nerves grossly intact. moves all 4 extremities w/o difficulty. Affect pleasant.  ECG:   ASSESSMENT & PLAN:  [facility] *** GDMT  Diuretic- BB- Ace/ARB/ARNI MRA SGLT2i    Referred to HFSW (PCP, Medications, Transportation, ETOH Abuse, Drug Abuse, Insurance, Financial ): Yes or No Refer to Pharmacy: Yes or No Refer to Home Health: Yes on No Refer to Advanced Heart Failure Clinic: Yes or no  Refer to General Cardiology: Yes or No  Follow up

## 2022-10-29 NOTE — Patient Instructions (Signed)
Description   HOLD today's dose and HOLD tomorrow's dose and then resume taking 1 tablet daily EXCEPT 2 tablets on Sundays.  REPORT TO ER WITH ANY BLEEDING, FALLS, ACCIDENTS Recheck INR in 1 week.  C[facility]madin Clinic [phone].  Stay consistent with 1-2 helping of greens per week.

## 2022-10-30 ENCOUNTER — Encounter (HOSPITAL_COMMUNITY): Payer: Self-pay

## 2022-10-31 ENCOUNTER — Telehealth (HOSPITAL_COMMUNITY): Payer: Self-pay

## 2022-10-31 DIAGNOSIS — I5042 Chronic combined systolic (congestive) and diastolic (congestive) heart failure: Secondary | ICD-10-CM

## 2022-10-31 MED ORDER — SPIRONOLACTONE 25 MG PO TABS: 25.0000 mg | ORAL_TABLET | Freq: Every day | ORAL | 11 refills | Status: DC

## 2022-10-31 MED ORDER — FUROSEMIDE 20 MG PO TABS: 20.0000 mg | ORAL_TABLET | Freq: Two times a day (BID) | ORAL | 11 refills | Status: DC

## 2022-10-31 NOTE — Telephone Encounter (Signed)
Patient called [facility], pt advised and verbalized understanding,lab appointment scheduled,lab orders entered, med list updated to reflect changes.   Meds ordered this encounter  Medications   furosemide (LASIX) 20 MG tablet    Sig: Take 1 tablet (20 mg total) by mouth 2 (two) times daily.    Dispense:  60 tablet    Refill:  11    Please cancel all previous orders for current medication. Change in dosage or pill size.   spironolactone (ALDACTONE) 25 MG tablet    Sig: Take 1 tablet (25 mg total) by mouth daily.    Dispense:  30 tablet    Refill:  11    Please cancel all previous orders for current medication. Change in dosage or pill size.   Orders Placed This Encounter  Procedures   Basic metabolic panel    Standing Status:   Future    Standing Expiration Date:   10/31/2023    Order Specific Question:   Release to patient    Answer:   Immediate    Order Specific Question:   Release to patient    Answer:   Immediate [1]   B Nat Peptide    Standing Status:   Future    Standing Expiration Date:   10/31/2023    Order Specific Question:   Release to patient    Answer:   Immediate [1]

## 2022-10-31 NOTE — Telephone Encounter (Signed)
-----   Message from Allayne Butcher, [place] sent at 10/30/2022  8:09 AM EDT ----- Kidney function much improved and back to baseline. Fluid levels are elevated and need to adjust fluid medication.   Increase Lasix to 20 mg bid  Increase Spironolactone to 25 mg daily   Repeat BMP and BNP in 1 wk  Keep f/u w/ cardiology team on 6/17

## 2022-11-03 ENCOUNTER — Other Ambulatory Visit: Payer: Self-pay

## 2022-11-03 MED ORDER — FUROSEMIDE 20 MG PO TABS: 20.0000 mg | ORAL_TABLET | Freq: Two times a day (BID) | ORAL | 11 refills | Status: DC

## 2022-11-05 ENCOUNTER — Ambulatory Visit: Payer: Medicare Other | Attending: Cardiovascular Disease | Admitting: *Deleted

## 2022-11-05 DIAGNOSIS — Z952 Presence of prosthetic heart valve: Secondary | ICD-10-CM | POA: Diagnosis not present

## 2022-11-05 DIAGNOSIS — Z7901 Long term (current) use of anticoagulants: Secondary | ICD-10-CM

## 2022-11-05 LAB — POCT INR: INR: 3.9 — AB (ref 2.0–3.0)

## 2022-11-05 NOTE — Patient Instructions (Addendum)
Description   HOLD today's dose then START taking 1 tablet daily.  REPORT TO ER WITH ANY BLEEDING, FALLS, ACCIDENTS Recheck INR in 11 days.  Coumadin Clinic [phone].  Stay consistent with 1-2 helping of greens per week.

## 2022-11-10 ENCOUNTER — Ambulatory Visit (HOSPITAL_COMMUNITY)
Admission: RE | Admit: 2022-11-10 | Discharge: 2022-11-10 | Disposition: A | Discharge status: Home / Self Care | Payer: Medicare Other | Source: Ambulatory Visit | Attending: Internal Medicine | Admitting: Internal Medicine

## 2022-11-10 DIAGNOSIS — I5042 Chronic combined systolic (congestive) and diastolic (congestive) heart failure: Secondary | ICD-10-CM | POA: Diagnosis present

## 2022-11-10 LAB — BASIC METABOLIC PANEL
Anion gap: 12 (ref 5–15)
BUN: 20 mg/dL (ref 8–23)
CO2: 26 mmol/L (ref 22–32)
Calcium: 9.1 mg/dL (ref 8.9–10.3)
Chloride: 99 mmol/L (ref 98–111)
Creatinine, Ser: 1.25 mg/dL — ABNORMAL HIGH (ref 0.44–1.00)
GFR, Estimated: 44 mL/min — ABNORMAL LOW (ref >60)
Glucose, Bld: 124 mg/dL — ABNORMAL HIGH (ref 70–99)
Potassium: 4.4 mmol/L (ref 3.5–5.1)
Sodium: 137 mmol/L (ref 135–145)

## 2022-11-17 ENCOUNTER — Encounter: Payer: Self-pay | Admitting: Cardiovascular Disease

## 2022-11-17 ENCOUNTER — Ambulatory Visit: Payer: Medicare Other | Attending: Cardiovascular Disease | Admitting: Cardiovascular Disease

## 2022-11-17 ENCOUNTER — Ambulatory Visit: Payer: Medicare Other

## 2022-11-17 VITALS — BP 186/67 | HR 73 | Ht 62.0 in | Wt 91.8 lb

## 2022-11-17 DIAGNOSIS — I480 Paroxysmal atrial fibrillation: Secondary | ICD-10-CM

## 2022-11-17 DIAGNOSIS — Z95810 Presence of automatic (implantable) cardiac defibrillator: Secondary | ICD-10-CM

## 2022-11-17 DIAGNOSIS — I5042 Chronic combined systolic (congestive) and diastolic (congestive) heart failure: Secondary | ICD-10-CM

## 2022-11-17 DIAGNOSIS — Z7901 Long term (current) use of anticoagulants: Secondary | ICD-10-CM

## 2022-11-17 DIAGNOSIS — N1832 Chronic kidney disease, stage 3b: Secondary | ICD-10-CM

## 2022-11-17 DIAGNOSIS — T82110S Breakdown (mechanical) of cardiac electrode, sequela: Secondary | ICD-10-CM

## 2022-11-17 DIAGNOSIS — E782 Mixed hyperlipidemia: Secondary | ICD-10-CM

## 2022-11-17 DIAGNOSIS — I498 Other specified cardiac arrhythmias: Secondary | ICD-10-CM

## 2022-11-17 DIAGNOSIS — D6869 Other thrombophilia: Secondary | ICD-10-CM | POA: Diagnosis not present

## 2022-11-17 DIAGNOSIS — Z952 Presence of prosthetic heart valve: Secondary | ICD-10-CM

## 2022-11-17 LAB — CUP PACEART REMOTE DEVICE CHECK
Battery Remaining Longevity: 28 mo
Battery Voltage: 2.95 V
Brady Statistic AP VP Percent: 80.24 %
Brady Statistic AP VS Percent: 3.12 %
Brady Statistic AS VP Percent: 16.19 %
Brady Statistic AS VS Percent: 0.45 %
Brady Statistic RA Percent Paced: 82.89 %
Brady Statistic RV Percent Paced: 0.47 %
Date Time Interrogation Session: 20240617001707
HighPow Impedance: 55 Ohm
Implantable Lead Connection Status: 753985
Implantable Lead Connection Status: 753985
Implantable Lead Connection Status: 753985
Implantable Lead Implant Date: 20080221
Implantable Lead Implant Date: 20080221
Implantable Lead Implant Date: 20080221
Implantable Lead Location: 753858
Implantable Lead Location: 753859
Implantable Lead Location: 753860
Implantable Lead Model: 4543
Implantable Lead Model: 5076
Implantable Lead Model: 7002
Implantable Lead Serial Number: 134379
Implantable Pulse Generator Implant Date: 20181128
Lead Channel Impedance Value: 304 Ohm
Lead Channel Impedance Value: 342 Ohm
Lead Channel Impedance Value: 342 Ohm
Lead Channel Impedance Value: 380 Ohm
Lead Channel Impedance Value: 399 Ohm
Lead Channel Impedance Value: 646 Ohm
Lead Channel Pacing Threshold Amplitude: 0.625 V
Lead Channel Pacing Threshold Amplitude: 0.625 V
Lead Channel Pacing Threshold Amplitude: 1.25 V
Lead Channel Pacing Threshold Pulse Width: 0.4 ms
Lead Channel Pacing Threshold Pulse Width: 0.4 ms
Lead Channel Pacing Threshold Pulse Width: 0.6 ms
Lead Channel Sensing Intrinsic Amplitude: 2.125 mV
Lead Channel Sensing Intrinsic Amplitude: 2.125 mV
Lead Channel Sensing Intrinsic Amplitude: 28.25 mV
Lead Channel Sensing Intrinsic Amplitude: 28.25 mV
Lead Channel Setting Pacing Amplitude: 1.25 V
Lead Channel Setting Pacing Amplitude: 1.5 V
Lead Channel Setting Pacing Amplitude: 2.5 V
Lead Channel Setting Pacing Pulse Width: 0.4 ms
Lead Channel Setting Pacing Pulse Width: 0.6 ms
Lead Channel Setting Sensing Sensitivity: 1.2 mV
Zone Setting Status: 755011

## 2022-11-17 LAB — POCT INR: INR: 3.1 — AB (ref 2.0–3.0)

## 2022-11-17 MED ORDER — METOPROLOL SUCCINATE ER 50 MG PO TB24: ORAL_TABLET | ORAL | 3 refills | Status: DC

## 2022-11-17 MED ORDER — SACUBITRIL-VALSARTAN 24-26 MG PO TABS: 1.0000 | ORAL_TABLET | Freq: Two times a day (BID) | ORAL | 3 refills | Status: DC

## 2022-11-17 MED ORDER — FUROSEMIDE 40 MG PO TABS: 40.0000 mg | ORAL_TABLET | Freq: Every day | ORAL | 3 refills | Status: DC

## 2022-11-17 NOTE — Progress Notes (Signed)
Cardiology office note    Date:  11/25/2022   ID:  [name] Sep 20, 1940, MRN [id number]  PCP:  Garlan Fillers, MD  Cardiologist: Nanetta Batty, MD  Electrophysiologist:  Lanier Prude, MD   Evaluation Performed:  Follow-Up Visit  Chief Complaint:  ICD follow up  History of Present Illness:    [name] is a 82 y.o. female with  mechanical mitral valve replacement (repair 2002, replacement 2007,  biventricular pacemaker/defibrillator  (Medtronic [place], generator changeout in November 2018) and chronic heart failure.    She was hospitalized 5/3 2024 with acute on chronic heart failure, and echocardiogram showed further drop in EF to 20-25%.  There was normal function of the mitral prosthesis and the right ventricle appear to be normal.  She underwent a left heart catheterization which showed mild CAD without any obstructive lesions.  The right heart catheterization after diuresis showed normal filling pressures (RA 3, mean PA 23, mean PAWP 15, normal cardiac index 2.6).  [name] was started but subsequently discontinued due to increased serum creatinine.  Dry weight is estimated to be 90 pounds.  Follow-up in the [facility] on 10/29/2022 showed improvement in serum creatinine to 1.2, weight 94 pounds.  Optivol (which had improved) was trending back upward and approaching threshold.  The cause of  worsening left ventricular systolic function is not immediately clear, but interrogation of her biventricular device did show reduction in effective biventricular pacing from 93% to only 89%. When she saw Dr. Lalla Brothers in EP consultation in July 2023 he increased her dose of beta-blocker to help suppress the episodes of ventricular sensed beats, mostly due to periods of junctional rhythm.  Anemia seemed to play a role in the increasing burden of accelerated junctional rhythm.  Today, device interrogation shows improvement in overall biventricular pacing to 96%  (effective resynchronization 94.7%).  Presenting rhythm is atrial paced-biventricular paced rhythm is not had any atrial fibrillation or ventricular tachycardia.  OptiVol has been up and down a lot over the last 12 months and she is currently still "wet" according to the thoracic impedance tracing.  She remains extremely lean/undernourished, currently with a BMI just over 18.  Pacemaker interrogation shows normal device function.  Generator longevity is estimated at 2.8 years.  She has 92% atrial pacing and 97.2% effective biventricular pacing, both of which are substantially higher compared with historical trends.  She has not had any ventricular tachycardia or atrial fibrillation in the last 3 months.  Her thoracic impedance/OptiVol has been elevated since the beginning of November but is trending 2 words improvement and is almost back to baseline.  Estimated gentle Ingevity is 2.3 years, most lead parameters are good and there is clear evidence of appropriate LV lead capture.  The twelve-lead ECG shows distinct positive R wave in lead V1-V2 and the QRS is relatively narrow.  Unfortunately, due to "noise" on the RV pace sense channel ventricular arrhythmia detection and therapies are turned off.  In early 2020 she began developing" noise" on the right ventricular pace-sense channel.  She had recovered left ventricular systolic function to an ejection fraction of 50-55% and she has never received tachycardia therapies.  Ventricular arrhythmia detection and therapies are turned off.  Echo performed in March 2022 shows stable left ventricular ejection fraction of 45-50%.  The mean mitral valve gradient is 4 mmHg at a heart rate of 86 bpm which is also stable. Echo repeated 10/30/2021 shows reduced LVEF down to 30-35%.  This is  due to global hypokinesis.  Mitral valve prosthetic function is normal.  Mean gradient 3 mmHg at 70 bpm.   Unfortunately, [name] has lost both her sons to substance abuse at  the age of 42.  She is raising her 71 year old grandson, since both of his parents died by overdose.  She has a great relationship with her other grandchild a 76 year old granddaughter who is living independently.  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 Left ventricular ejection fraction which has been as low as 10% improved substantially after implantation of the CRT-D device and now her LVEF is near normal. She received her initial CRT-D in 2008 and underwent a generator change out in 2012 and 2019. She has not received VT therapy. She has a Riata 7 [place] ICD lead under advisory, which appeared fluoroscopically normal at the time of last generator change.  Did not have any angiographic CAD prior to her surgery.     The atrial lead is a Medtronic [id number] serial number [id number] implanted in July 23, 2006 [name]) The right ventricular lead is a Development worker, international aid 7002 serial number [id number] implanted same date The left ventricular lead is a Guidant BS CI 4543 LV lead, serial [id number] implanted same date She had redundant Medtronic left ventricular epicardial pacing leads implanted at the time of surgery in 2007. There is over sensing/"noise" on the RV pace/sense lead.   Past Medical History:  Diagnosis Date   Allergy    seasonal   Anemia    Anxiety    Arthritis    Asthma    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 Protecta   Biventricular ICD (implantable cardioverter-defibrillator) in place    with CRT   Blood transfusion without reported diagnosis    Bursitis    Cataract    RIGHT EYE   CHF (congestive heart failure) (HCC)    Chronic kidney disease    Colon polyp    adenomatous   Complication of anesthesia    patient stated that had difficulty getting the breathing tube removed, patient said that she stopped  breathing and HR dropped to 10  patient then woke up and started breathing pateint stated no longer than one minute; re-intubated in PACU following cholecystectomy 10/28/13   COPD (chronic obstructive pulmonary disease) (HCC)    Depression    Diverticulosis    Dysrhythmia    Fibromyalgia    GERD (gastroesophageal reflux disease)    Gout    H/O mitral valve replacement 2002, 2007   Heart murmur    Hemorrhoids    Hyperlipidemia    Hypertension    Hypothyroidism    IBS (irritable bowel syndrome)    MVP (mitral valve prolapse)    NAUSEA AND VOMITING 08/01/2009   Neuromuscular disorder (HCC)    fibromyalgia   Pacemaker    Peptic ulcer disease    Past Surgical History:  Procedure Laterality Date   ABDOMINAL HYSTERECTOMY     BIV ICD GENERATOR CHANGEOUT N/A 04/29/2017   Procedure: BIV ICD GENERATOR CHANGEOUT;  Surgeon: Thurmon Fair, MD;  Location: [facility];  Service: Cardiovascular;  Laterality: N/A;   CARDIAC CATHETERIZATION  02/25/2006   normal left main, normal LAD, normal L Cfx, normal/dominant RCA (Dr. Erlene Quan)   CARDIAC DEFIBRILLATOR PLACEMENT  2007, 11/202012   x2 (pacemaker) (Dr. Ruby Cola)   CARDIAC  VALVE REPLACEMENT  2002   MV repair - Dr. Lisabeth Register   CHOLECYSTECTOMY N/A 10/28/2013   Procedure: LAPAROSCOPIC CHOLECYSTECTOMY WITH INTRAOPERATIVE CHOLANGIOGRAM;  Surgeon: Mariella Saa, MD;  Location: [facility] OR;  Service: General;  Laterality: N/A;   COLONOSCOPY     ENTEROSCOPY N/A 11/09/2021   Procedure: ENTEROSCOPY;  Surgeon: Meridee Score Netty Starring., MD;  Location: [facility] ENDOSCOPY;  Service: Gastroenterology;  Laterality: N/A;   ESOPHAGOGASTRODUODENOSCOPY (EGD) WITH PROPOFOL N/A 03/02/2021   Procedure: ESOPHAGOGASTRODUODENOSCOPY (EGD) WITH PROPOFOL;  Surgeon: Jenel Lucks, MD;  Location: [facility] ENDOSCOPY;  Service: Gastroenterology;  Laterality: N/A;   EYE SURGERY     HEMOSTASIS CLIP PLACEMENT  03/02/2021   Procedure: HEMOSTASIS CLIP PLACEMENT;  Surgeon:  Jenel Lucks, MD;  Location: [facility] ENDOSCOPY;  Service: Gastroenterology;;   HEMOSTASIS CLIP PLACEMENT  11/09/2021   Procedure: HEMOSTASIS CLIP PLACEMENT;  Surgeon: Lemar Lofty., MD;  Location: [facility] ENDOSCOPY;  Service: Gastroenterology;;   HOT HEMOSTASIS  03/02/2021   Procedure: HOT HEMOSTASIS (ARGON PLASMA COAGULATION/BICAP);  Surgeon: Jenel Lucks, MD;  Location: [facility] ENDOSCOPY;  Service: Gastroenterology;;   HOT HEMOSTASIS N/A 11/09/2021   Procedure: HOT HEMOSTASIS (ARGON PLASMA COAGULATION/BICAP);  Surgeon: Lemar Lofty., MD;  Location: [facility] ENDOSCOPY;  Service: Gastroenterology;  Laterality: N/A;   IMPLANTABLE CARDIOVERTER DEFIBRILLATOR (ICD) GENERATOR CHANGE N/A 04/10/2011   Procedure: ICD GENERATOR CHANGE;  Surgeon: Thurmon Fair, MD;  Location: [facility] CATH LAB;  Service: Cardiovascular;  Laterality: N/A;   INSERT / REPLACE / REMOVE PACEMAKER     KNEE ARTHROSCOPY     MITRAL VALVE REPLACEMENT  02/26/2006   re-do MVR w/88mm St. [name] (Dr. Wayland Salinas)   NM MYOCAR PERF WALL MOTION  2005   persantine myoview - low ris, EF 63%   RIGHT HEART CATH  04/03/2006   pulm cap wedge pressure 24/24, PA pressure 43/22 (mean ), CO 4.8, CI 4.1 (Dr. Evlyn Courier)   RIGHT/LEFT HEART CATH AND CORONARY ANGIOGRAPHY N/A 10/08/2022   Procedure: RIGHT/LEFT HEART CATH AND CORONARY ANGIOGRAPHY;  Surgeon: Lennette Bihari, MD;  Location: [facility] LAB;  Service: Cardiovascular;  Laterality: N/A;   SCLEROTHERAPY  03/02/2021   Procedure: SCLEROTHERAPY;  Surgeon: Jenel Lucks, MD;  Location: [facility] ENDOSCOPY;  Service: Gastroenterology;;   SUBMUCOSAL TATTOO INJECTION  11/09/2021   Procedure: SUBMUCOSAL TATTOO INJECTION;  Surgeon: Lemar Lofty., MD;  Location: [facility] ENDOSCOPY;  Service: Gastroenterology;;   TOTAL SHOULDER ARTHROPLASTY Right 12/27/2015   Procedure: RIGHT TOTAL SHOULDER ARTHROPLASTY;  Surgeon: Jones Broom, MD;  Location: [facility] OR;  Service: Orthopedics;  Laterality: Right;   Right total shoulder arthroplasty   TOTAL SHOULDER ARTHROPLASTY Left 10/27/2019   Procedure: REVERSE TOTAL SHOULDER ARTHROPLASTY;  Surgeon: Jones Broom, MD;  Location: [facility] ORS;  Service: Orthopedics;  Laterality: Left;   TRANSTHORACIC ECHOCARDIOGRAM  12/2011   EF 50-55%, mild global hypokinesis; LA severely dilated; calcification of anterior/posterior MV leaflets, bi-leaflets St. [name] mechanical MV; mild TR; trace AV regurg/pulm valve regurg     Current Meds  Medication Sig   acetaminophen (TYLENOL) 650 MG CR tablet Take 650 mg by mouth every 8 (eight) hours as needed for pain.   allopurinol (ZYLOPRIM) 300 MG tablet Take 300 mg by mouth every evening.    ALPRAZolam (XANAX) 0.25 MG tablet Take 0.25 mg by mouth at bedtime as needed for anxiety or sleep.    antiseptic oral rinse (BIOTENE) LIQD 15 mLs by Mouth Rinse route as needed for dry mouth.   buPROPion (WELLBUTRIN SR) 150 MG  12 hr tablet Take 150 mg by mouth in the morning.   esomeprazole (NEXIUM) 40 MG capsule TAKE 1 CAPSULE BY MOUTH EVERY DAY BEFORE BREAKFAST (Patient taking differently: Take 40 mg by mouth daily before breakfast.)   ferrous sulfate 325 (65 FE) MG tablet Take 1 tablet (325 mg total) by mouth daily with breakfast.   fluticasone (FLONASE) 50 MCG/ACT nasal spray Place 1 spray into both nostrils as needed for allergies.   icosapent Ethyl (VASCEPA) 1 g capsule Take 1 capsule (1 g total) by mouth 2 (two) times daily.   ipratropium (ATROVENT) 0.06 % nasal spray Place 2 sprays into both nostrils 2 (two) times daily.   levothyroxine (SYNTHROID) 75 MCG tablet Take 75 mcg by mouth daily before breakfast.   memantine (NAMENDA) 5 MG tablet Take 5 mg by mouth every morning.   metoCLOPramide (REGLAN) 5 MG tablet Take 5 mg by mouth 2 (two) times daily as needed for nausea or vomiting.   Multiple Vitamin (MULTIVITAMIN WITH MINERALS) TABS tablet Take 1 tablet by mouth daily.   pravastatin (PRAVACHOL) 40 MG tablet Take 40 mg at bedtime by  mouth.    PROAIR HFA 108 (90 BASE) MCG/ACT inhaler Inhale 1-2 puffs into the lungs every 6 (six) hours as needed for wheezing.   sacubitril-valsartan (ENTRESTO) 24-26 MG Take 1 tablet by mouth 2 (two) times daily.   Sennosides (SENNA) 15 MG TABS Take 30 mg by mouth at bedtime.   spironolactone (ALDACTONE) 25 MG tablet Take 1 tablet (25 mg total) by mouth daily.   VOLTAREN 1 % GEL Apply 2-4 g topically in the morning and at bedtime.   warfarin (COUMADIN) 2.5 MG tablet TAKE 1 TO 2 TABLETS BY MOUTH DAILY AS DIRECTED BY THE COUMADIN CLINIC   [DISCONTINUED] furosemide (LASIX) 20 MG tablet Take 1 tablet (20 mg total) by mouth 2 (two) times daily.   [DISCONTINUED] hydrALAZINE (APRESOLINE) 25 MG tablet Take 1 tablet (25 mg total) by mouth 3 (three) times daily.   [DISCONTINUED] isosorbide mononitrate (IMDUR) 30 MG 24 hr tablet Take 0.5 tablets (15 mg total) by mouth daily.   [DISCONTINUED] metoprolol succinate (TOPROL-XL) 50 MG 24 hr tablet Take 1 tablet (50 mg total) by mouth 2 (two) times daily. Take with or immediately following a meal.   Current Facility-Administered Medications for the 11/17/22 encounter (Office Visit) with Glanda Spanbauer, Rachelle Hora, MD  Medication   0.9 %  sodium chloride infusion     Allergies:   Ace inhibitors, Lipitor [atorvastatin calcium], Megace [megestrol], Aspirin, Clorazepate dipotassium, Simvastatin, Tiotropium bromide monohydrate, Codeine, and Tape   Social History   Tobacco Use   Smoking status: Former    Types: Cigarettes    Quit date: 06/02/2001    Years since quitting: 21.4   Smokeless tobacco: Never  Vaping Use   Vaping Use: Never used  Substance Use Topics   Alcohol use: Yes    Alcohol/week: 0.0 standard drinks of alcohol    Comment: social-1 every 6 months   Drug use: No     Family Hx: The patient's family history includes Breast cancer in her sister; Crohn's disease in her sister; Heart attack in her father and mother; Heart disease in her father, mother,  sister, and son; Kidney disease in her father; Uterine cancer in her sister. There is no history of Colon cancer or Stomach cancer.  ROS:   Please see the history of present illness.    All other systems are reviewed and are negative.  Prior CV studies:  The following studies were reviewed today: 10/30/2021 ECHO  1. Left ventricular ejection fraction, by estimation, is 30 to 35%. The  left ventricle has moderately decreased function. The left ventricle  demonstrates global hypokinesis. Left ventricular diastolic parameters are  indeterminate.   2. Right ventricular systolic function is normal. The right ventricular  size is normal. Tricuspid regurgitation signal is inadequate for assessing  PA pressure.   3. Left atrial size was mildly dilated.   4. The mitral valve has been repaired/replaced. Trivial mitral valve  regurgitation. The mean mitral valve gradient is 3.0 mmHg with average  heart rate of 70 bpm. There is a 25 mm St. [name] mechanical valve present  in the mitral position. Procedure Date:  02/26/2006.   5. The aortic valve is tricuspid. Aortic valve regurgitation is mild.  Aortic valve sclerosis is present, with no evidence of aortic valve  stenosis.   6. The inferior vena cava is normal in size with greater than 50%  respiratory variability, suggesting right atrial pressure of 3 mmHg.        Labs/Other Tests and Data Reviewed:    EKG: A paced V paced rhythm with a distinct positive R wave in lead V1, QRS 162 ms Recent Labs: 10/08/2022: Magnesium 2.2 10/09/2022: ALT 85; TSH 2.151 10/12/2022: Hemoglobin 9.2; Platelets 189 10/29/2022: B Natriuretic Peptide 1,309.4 11/10/2022: BUN 20; Creatinine, Ser 1.25; Potassium 4.4; Sodium 137   03/03/2022 creatinine 1.35 12/19/2021 hemoglobin 10.7  Recent Lipid Panel Lab Results  Component Value Date/Time   CHOL 273 (H) 09/03/2022 11:08 AM   TRIG 383 (H) 09/03/2022 11:08 AM   HDL 34 (L) 09/03/2022 11:08 AM   CHOLHDL 8.0 (H)  09/03/2022 11:08 AM   CHOLHDL 8.2 12/09/2006 04:10 AM   LDLCALC 165 (H) 09/03/2022 11:08 AM   LDLDIRECT 160 (H) 09/03/2022 11:08 AM    11/23/2020 Total cholesterol 198, HDL 27, LDL 108, triglycerides 363  01/16/2022 Cholesterol 222, HDL 28, LDL 121, triglycerides 367  Wt Readings from Last 3 Encounters:  11/20/22 89 lb (40.4 kg)  11/17/22 91 lb 12.8 oz (41.6 kg)  10/29/22 93 lb 12.8 oz (42.5 kg)     Objective:    Vital Signs:  BP (!) 186/67   Pulse 73   Ht 5\' 2"  (1.575 m)   Wt 91 lb 12.8 oz (41.6 kg)   SpO2 95%   BMI 16.79 kg/m      General: Alert, oriented x3, no distress, underweight.  Prominent but healthy ICD site Head: no evidence of trauma, PERRL, EOMI, no exophtalmos or lid lag, no myxedema, no xanthelasma; normal ears, nose and oropharynx Neck: normal jugular venous pulsations and no hepatojugular reflux; brisk carotid pulses without delay and no carotid bruits Chest: clear to auscultation, no signs of consolidation by percussion or palpation, normal fremitus, symmetrical and full respiratory excursions Cardiovascular: normal position and quality of the apical impulse, regular rhythm, normal first and second heart sounds, faint aortic ejection murmur, no diastolic murmurs, rubs or gallops Abdomen: no tenderness or distention, no masses by palpation, no abnormal pulsatility or arterial bruits, normal bowel sounds, no hepatosplenomegaly Extremities: no clubbing, cyanosis or edema; 2+ radial, ulnar and brachial pulses bilaterally; 2+ right femoral, posterior tibial and dorsalis pedis pulses; 2+ left femoral, posterior tibial and dorsalis pedis pulses; no subclavian or femoral bruits Neurological: grossly nonfocal Psych: Normal mood and affect     ASSESSMENT & PLAN:    CHF: No overt clinical signs of hypervolemia, but her thoracic impedance is still  out of range.  LVEF has reached further to 25% and the exact cause of this is uncertain.  Will try to get her back on  Entresto as her blood pressure has improved.  Will replace the hydralazine and isosorbide with this.  Continue beta-blocker for PVC suppression/junctional rhythm suppression to allow good cardiac resynchronization.  Furosemide 40 mg daily.  Repeat labs in 2 weeks to include a basic metabolic panel and a BNP. Anticoagulation: INR 3.1, no active bleeding issues CRT-D: Defibrillator therapies are not active on her device.  Problems with oversensing on her right ventricular lead and recovery of LV function have led to discontinuation of tachycardia therapies.  She now has diminished left ventricular ejection fraction again.  She has never required tachycardia therapies.   Dr. Lalla Brothers did not think she was a good candidate for lead revision. PVCs/junctional rhythm: Continue beta-blockers AFib: No true atrial fibrillation seen recently.  The episodes of artifactual mode switch are also substantially improved after reprogrammed the atrial sensing parameters. CKD 3: Most recent creatinine is substantially improved to 1.22-1.25 (peaked at 2.36 following diuresis during her hospitalization in May). Mixed hyperlipidemia: She does not have known CAD or PAD.  Currently on pravastatin 40 mg at bedtime, intolerant of more active statins.  Patient Instructions  Medication Instructions:  STOP HYDRALAZINE STOP ISOSORBIDE START Lasix 40 mg daily START Entresto 24/26 two times a day START Metoprolol Succinate 25 mg every morning and 50 mg every evening  *If you need a refill on your cardiac medications before your next appointment, please call your pharmacy*   Lab Work: BMP, BNP in 2 weeks If you have labs (blood work) drawn today and your tests are completely normal, you will receive your results only by: MyChart Message (if you have MyChart) OR A paper copy in the mail If you have any lab test that is abnormal or we need to change your treatment, we will call you to review the results.   Follow-Up: At [facility], you and your health needs are our priority.  As part of our continuing mission to provide you with exceptional heart care, we have created designated Provider Care Teams.  These Care Teams include your primary Cardiologist (physician) and Advanced Practice Providers (APPs -  Physician Assistants and Nurse Practitioners) who all work together to provide you with the care you need, when you need it.  We recommend signing up for the patient portal called "MyChart".  Sign up information is provided on this After Visit Summary.  MyChart is used to connect with patients for Virtual Visits (Telemedicine).  Patients are able to view lab/test results, encounter notes, upcoming appointments, etc.  Non-urgent messages can be sent to your provider as well.   To learn more about what you can do with MyChart, go to [email or web address].    Your next appointment:   1 month(s)  Provider:   Dr Royann Shivers or an APP    Signed, Thurmon Fair, MD  11/25/2022 1:19 PM    [facility]

## 2022-11-17 NOTE — Patient Instructions (Addendum)
Medication Instructions:  STOP HYDRALAZINE STOP ISOSORBIDE START Lasix 40 mg daily START Entresto 24/26 two times a day START Metoprolol Succinate 25 mg every morning and 50 mg every evening  *If you need a refill on your cardiac medications before your next appointment, please call your pharmacy*   Lab Work: BMP, BNP in 2 weeks If you have labs (blood work) drawn today and your tests are completely normal, you will receive your results only by: MyChart Message (if you have MyChart) OR A paper copy in the mail If you have any lab test that is abnormal or we need to change your treatment, we will call you to review the results.   Follow-Up: At [facility], you and your health needs are our priority.  As part of our continuing mission to provide you with exceptional heart care, we have created designated Provider Care Teams.  These Care Teams include your primary Cardiologist (physician) and Advanced Practice Providers (APPs -  Physician Assistants and Nurse Practitioners) who all work together to provide you with the care you need, when you need it.  We recommend signing up for the patient portal called "MyChart".  Sign up information is provided on this After Visit Summary.  MyChart is used to connect with patients for Virtual Visits (Telemedicine).  Patients are able to view lab/test results, encounter notes, upcoming appointments, etc.  Non-urgent messages can be sent to your provider as well.   To learn more about what you can do with MyChart, go to [email or web address].    Your next appointment:   1 month(s)  Provider:   Dr Royann Shivers or an APP

## 2022-11-17 NOTE — Patient Instructions (Signed)
Description   Continue taking 1 tablet daily.  Recheck INR in 2 weeks.  Coumadin Clinic [phone].  Stay consistent with 1-2 helping of greens per week.

## 2022-11-18 ENCOUNTER — Ambulatory Visit (INDEPENDENT_AMBULATORY_CARE_PROVIDER_SITE_OTHER): Payer: Medicare Other

## 2022-11-18 ENCOUNTER — Telehealth: Payer: Self-pay | Admitting: Cardiovascular Disease

## 2022-11-18 DIAGNOSIS — I480 Paroxysmal atrial fibrillation: Secondary | ICD-10-CM | POA: Diagnosis not present

## 2022-11-18 NOTE — Telephone Encounter (Signed)
Pt c/o medication issue:  1. Name of Medication:   sacubitril-valsartan (ENTRESTO) 24-26 MG   2. How are you currently taking this medication (dosage and times per day)?   Not taking yet  3. Are you having a reaction (difficulty breathing--STAT)?   4. What is your medication issue?   Husband stated this medication is too expensive and wants to get a less expensive alternate medication.

## 2022-11-18 NOTE — Telephone Encounter (Signed)
Spoke with pt, she reports her co-pay is $141. Offered her samples while we fill out the application for patient assistance and she reports she is going to go pick it up from the pharmacy and to mail the application to her. Application placed in the mail.

## 2022-11-20 ENCOUNTER — Encounter: Payer: Self-pay | Admitting: Cardiovascular Disease

## 2022-11-20 ENCOUNTER — Ambulatory Visit: Payer: Medicare Other | Attending: Cardiovascular Disease | Admitting: Cardiovascular Disease

## 2022-11-20 VITALS — BP 142/60 | HR 73 | Ht 62.0 in | Wt 89.0 lb

## 2022-11-20 DIAGNOSIS — I5042 Chronic combined systolic (congestive) and diastolic (congestive) heart failure: Secondary | ICD-10-CM | POA: Diagnosis not present

## 2022-11-20 DIAGNOSIS — Z952 Presence of prosthetic heart valve: Secondary | ICD-10-CM

## 2022-11-20 DIAGNOSIS — I480 Paroxysmal atrial fibrillation: Secondary | ICD-10-CM | POA: Diagnosis not present

## 2022-11-20 DIAGNOSIS — I5043 Acute on chronic combined systolic (congestive) and diastolic (congestive) heart failure: Secondary | ICD-10-CM

## 2022-11-20 DIAGNOSIS — Z95810 Presence of automatic (implantable) cardiac defibrillator: Secondary | ICD-10-CM

## 2022-11-20 NOTE — Patient Instructions (Addendum)
Medication Instructions:  Your physician recommends that you continue on your current medications as directed. Please refer to the Current Medication list given to you today.  *If you need a refill on your cardiac medications before your next appointment, please call your pharmacy*   Lab Work: NONE If you have labs (blood work) drawn today and your tests are completely normal, you will receive your results only by: MyChart Message (if you have MyChart) OR A paper copy in the mail If you have any lab test that is abnormal or we need to change your treatment, we will call you to review the results.   Testing/Procedures: Your physician has requested that you have an echocardiogram. Echocardiography is a painless test that uses sound waves to create images of your heart. It provides your doctor with information about the size and shape of your heart and how well your heart's chambers and valves are working. This procedure takes approximately one hour. There are no restrictions for this procedure. TO BE DONE IN 3 MONTHS Please do NOT wear cologne, perfume, aftershave, or lotions (deodorant is allowed). Please arrive 15 minutes prior to your appointment time.    Follow-Up: At [facility], you and your health needs are our priority.  As part of our continuing mission to provide you with exceptional heart care, we have created designated Provider Care Teams.  These Care Teams include your primary Cardiologist (physician) and Advanced Practice Providers (APPs -  Physician Assistants and Nurse Practitioners) who all work together to provide you with the care you need, when you need it.  We recommend signing up for the patient portal called "MyChart".  Sign up information is provided on this After Visit Summary.  MyChart is used to connect with patients for Virtual Visits (Telemedicine).  Patients are able to view lab/test results, encounter notes, upcoming appointments, etc.  Non-urgent  messages can be sent to your provider as well.   To learn more about what you can do with MyChart, go to [email or web address].    Your next appointment:   6 month(s)  Provider:   Nanetta Batty, MD     Other Instructions Echocardiogram An echocardiogram is a test that uses sound waves (ultrasound) to produce images of the heart. Images from an echocardiogram can provide important information about: Heart size and shape. The size and thickness and movement of your heart's walls. Heart muscle function and strength. Heart valve function or if you have stenosis. Stenosis is when the heart valves are too narrow. If blood is flowing backward through the heart valves (regurgitation). A tumor or infectious growth around the heart valves. Areas of heart muscle that are not working well because of poor blood flow or injury from a heart attack. Aneurysm detection. An aneurysm is a weak or damaged part of an artery wall. The wall bulges out from the normal force of blood pumping through the body. Tell a health care provider about: Any allergies you have. All medicines you are taking, including vitamins, herbs, eye drops, creams, and over-the-counter medicines. Any blood disorders you have. Any surgeries you have had. Any medical conditions you have. Whether you are pregnant or may be pregnant. What are the risks? Generally, this is a safe test. However, problems may occur, including an allergic reaction to dye (contrast) that may be used during the test. What happens before the test? No specific preparation is needed. You may eat and drink normally. What happens during the test?  You will take off your  clothes from the waist up and put on a hospital gown. Electrodes or electrocardiogram (ECG)patches may be placed on your chest. The electrodes or patches are then connected to a device that monitors your heart rate and rhythm. You will lie down on a table for an ultrasound exam. A gel will  be applied to your chest to help sound waves pass through your skin. A handheld device, called a transducer, will be pressed against your chest and moved over your heart. The transducer produces sound waves that travel to your heart and bounce back (or "echo" back) to the transducer. These sound waves will be captured in real-time and changed into images of your heart that can be viewed on a video monitor. The images will be recorded on a computer and reviewed by your health care provider. You may be asked to change positions or hold your breath for a short time. This makes it easier to get different views or better views of your heart. In some cases, you may receive contrast through an IV in one of your veins. This can improve the quality of the pictures from your heart. The procedure may vary among health care providers and hospitals. What can I expect after the test? You may return to your normal, everyday life, including diet, activities, and medicines, unless your health care provider tells you not to do that. Follow these instructions at home: It is up to you to get the results of your test. Ask your health care provider, or the department that is doing the test, when your results will be ready. Keep all follow-up visits. This is important. Summary An echocardiogram is a test that uses sound waves (ultrasound) to produce images of the heart. Images from an echocardiogram can provide important information about the size and shape of your heart, heart muscle function, heart valve function, and other possible heart problems. You do not need to do anything to prepare before this test. You may eat and drink normally. After the echocardiogram is completed, you may return to your normal, everyday life, unless your health care provider tells you not to do that. This information is not intended to replace advice given to you by your health care provider. Make sure you discuss any questions you have with  your health care provider. Document Revised: 01/30/2021 Document Reviewed: 01/10/2020 Elsevier Patient Education  2024 [employer].

## 2022-11-20 NOTE — Assessment & Plan Note (Signed)
History of hyperlipidemia on statin therapy with lipid profile performed 09/03/2022 revealing total cholesterol 273 with an LDL of 165, not at goal for primary prevention.

## 2022-11-20 NOTE — Assessment & Plan Note (Signed)
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 Laneta Simmers in 2007.  Her most recent 2D echo revealed her mitral valve prosthesis to be well-functioning.  She is on Coumadin anticoagulation.

## 2022-11-20 NOTE — Assessment & Plan Note (Signed)
History of severe LV dysfunction with recent echo performed during recent hospitalization/8/24 revealing EF of 20 to 25% with a well-functioning aortic mechanical prosthesis.  This represents a significant decline in EF compared to prior echoes.  She is on guideline directed optimal medical therapy and is followed in the heart failure clinic.  She did have a prolonged hospitalization 10/03/2022 for 12 days and was diuresed and medications were optimized.

## 2022-11-20 NOTE — Assessment & Plan Note (Signed)
History of resynchronization therapy which has been followed by Dr. Royann Shivers .  She had a generator change in 2012 and again 04/28/2017.  This has resulted in normalization of her previously severely depressed LV function.  Dr. Royann Shivers continues to follow this.

## 2022-11-20 NOTE — Progress Notes (Signed)
11/20/2022 [name]   05-08-1941  [id number]  Primary Physician Garlan Fillers, MD Primary Cardiologist: Runell Gess MD Milagros Loll, [place], [place]  HPI:  [name] is a 82 y.o.  thin appearing, married, Caucasian female, mother of 4(2 deceased sons), grandmother to eight grandchildren, who I last saw in the office 10/09/2021.[name] Her husband [name] is also a patient of mine and accompanies her today.  He had stage IV lung cancer, which responded to chemotherapy. 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 Lisabeth Register in 2002 with redo with a St. [name] MVR performed by Dr. Evelene Croon in 2007. She had normal coronary arteries. Her EF improved initially from 10% to normal with cardiac resynchronization therapy. Dr. Royann Shivers follows this. She has undergone a generator change by Dr. Royann Shivers on April 22, 2011, and she is followed by [facility] [employer].  Recent 2-D echocardiogram performed 02/07/16 revealed normal ejection fraction with a normal functioning mitral mechanical prosthesis. She denies chest pain or shortness of breath.   She had a bi-V generator change by Dr. Royann Shivers 04/28/17 after having a Lovenox bridge for her mitral valve. She is otherwise asymptomatic with her most recent 2-D echo performed 08/05/2017 revealing an EF of 50 to 55% with a well-functioning mitral mechanical prosthesis.   Since I saw her a year ago she she was hospitalized for 12 days in May with heart failure.  Her 2D echo revealed a significant decline in LV function with an EF in the 20 to 25% range down from 50 to 55%.  Her BiV ICD was interrogated by Dr. Royann Shivers and was found to be functioning properly.  Her mitral valve prosthesis was working normally as well.  She is on guideline directed optimal medical therapy.  She appears euvolemic today on exam.     Current Meds  Medication Sig   acetaminophen (TYLENOL) 650 MG CR tablet Take 650 mg by  mouth every 8 (eight) hours as needed for pain.   allopurinol (ZYLOPRIM) 300 MG tablet Take 300 mg by mouth every evening.    ALPRAZolam (XANAX) 0.25 MG tablet Take 0.25 mg by mouth at bedtime as needed for anxiety or sleep.    antiseptic oral rinse (BIOTENE) LIQD 15 mLs by Mouth Rinse route as needed for dry mouth.   buPROPion (WELLBUTRIN SR) 150 MG 12 hr tablet Take 150 mg by mouth in the morning.   esomeprazole (NEXIUM) 40 MG capsule TAKE 1 CAPSULE BY MOUTH EVERY DAY BEFORE BREAKFAST (Patient taking differently: Take 40 mg by mouth daily before breakfast.)   ferrous sulfate 325 (65 FE) MG tablet Take 1 tablet (325 mg total) by mouth daily with breakfast.   fluticasone (FLONASE) 50 MCG/ACT nasal spray Place 1 spray into both nostrils as needed for allergies.   furosemide (LASIX) 40 MG tablet Take 1 tablet (40 mg total) by mouth daily.   icosapent Ethyl (VASCEPA) 1 g capsule Take 1 capsule (1 g total) by mouth 2 (two) times daily.   ipratropium (ATROVENT) 0.06 % nasal spray Place 2 sprays into both nostrils 2 (two) times daily.   levothyroxine (SYNTHROID) 75 MCG tablet Take 75 mcg by mouth daily before breakfast.   memantine (NAMENDA) 5 MG tablet Take 5 mg by mouth every morning.   metoCLOPramide (REGLAN) 5 MG tablet Take 5 mg by mouth 2 (two) times daily as needed for nausea or vomiting.   metoprolol succinate (TOPROL-XL) 50 MG 24  hr tablet Take 0.5 tablets (25 mg total) by mouth every morning AND 1 tablet (50 mg total) every evening. Take with or immediately following a meal..   Multiple Vitamin (MULTIVITAMIN WITH MINERALS) TABS tablet Take 1 tablet by mouth daily.   pravastatin (PRAVACHOL) 40 MG tablet Take 40 mg at bedtime by mouth.    PROAIR HFA 108 (90 BASE) MCG/ACT inhaler Inhale 1-2 puffs into the lungs every 6 (six) hours as needed for wheezing.   sacubitril-valsartan (ENTRESTO) 24-26 MG Take 1 tablet by mouth 2 (two) times daily.   Sennosides (SENNA) 15 MG TABS Take 30 mg by mouth at  bedtime.   spironolactone (ALDACTONE) 25 MG tablet Take 1 tablet (25 mg total) by mouth daily.   VOLTAREN 1 % GEL Apply 2-4 g topically in the morning and at bedtime.   warfarin (COUMADIN) 2.5 MG tablet TAKE 1 TO 2 TABLETS BY MOUTH DAILY AS DIRECTED BY THE COUMADIN CLINIC   Current Facility-Administered Medications for the 11/20/22 encounter (Office Visit) with Runell Gess, MD  Medication   0.9 %  sodium chloride infusion     Allergies  Allergen Reactions   Ace Inhibitors Cough   Lipitor [Atorvastatin Calcium] Other (See Comments)    Knee pain    Megace [Megestrol] Other (See Comments)    High blood pressure and blurred vision   Aspirin Other (See Comments)    Nephrologist said to not take this Update 10/07/22 - patient clarifies this recommendation was lumped in with a whole bunch of other medications at that time many years ago when nephrologist told her to stay away from Tylenol, vitamins, ASA. Denies prior allergy or allergic reaction to this   Clorazepate Dipotassium Other (See Comments)    Interacts with a drug being taken   Simvastatin Other (See Comments)    Muscle pain   Tiotropium Bromide Monohydrate Other (See Comments)    Dry mouth   Codeine Nausea And Vomiting   Tape Rash    Social History   Socioeconomic History   Marital status: Married    Spouse name: [name]   Number of children: 4   Years of education: 12   Highest education level: Not on file  Occupational History   Occupation: retired  Tobacco Use   Smoking status: Former    Types: Cigarettes    Quit date: 06/02/2001    Years since quitting: 21.4   Smokeless tobacco: Never  Vaping Use   Vaping Use: Never used  Substance and Sexual Activity   Alcohol use: Yes    Alcohol/week: 0.0 standard drinks of alcohol    Comment: social-1 every 6 months   Drug use: No   Sexual activity: Not on file  Other Topics Concern   Not on file  Social History Narrative   Drinks very little caffeine    Social  Determinants of Health   Financial Resource Strain: Low Risk  (10/09/2022)   Overall Financial Resource Strain (CARDIA)    Difficulty of Paying Living Expenses: Not hard at all  Food Insecurity: No Food Insecurity (10/03/2022)   Hunger Vital Sign    Worried About Running Out of Food in the Last Year: Never true    Ran Out of Food in the Last Year: Never true  Transportation Needs: No Transportation Needs (10/03/2022)   PRAPARE - Administrator, Civil Service (Medical): No    Lack of Transportation (Non-Medical): No  Physical Activity: Not on file  Stress: Not on file  Social Connections: Not on file  Intimate Partner Violence: Not At Risk (10/03/2022)   Humiliation, Afraid, Rape, and Kick questionnaire    Fear of Current or Ex-Partner: No    Emotionally Abused: No    Physically Abused: No    Sexually Abused: No     Review of Systems: General: negative for chills, fever, night sweats or weight changes.  Cardiovascular: negative for chest pain, dyspnea on exertion, edema, orthopnea, palpitations, paroxysmal nocturnal dyspnea or shortness of breath Dermatological: negative for rash Respiratory: negative for cough or wheezing Urologic: negative for hematuria Abdominal: negative for nausea, vomiting, diarrhea, bright red blood per rectum, melena, or hematemesis Neurologic: negative for visual changes, syncope, or dizziness All other systems reviewed and are otherwise negative except as noted above.    Blood pressure (!) 142/60, pulse 73, height 5\' 2"  (1.575 m), weight 89 lb (40.4 kg), SpO2 96 %.  General appearance: alert and no distress Neck: no adenopathy, no carotid bruit, no JVD, supple, symmetrical, trachea midline, and thyroid not enlarged, symmetric, no tenderness/mass/nodules Lungs: clear to auscultation bilaterally Heart: Crisp prosthetic mitral valve sounds Extremities: extremities normal, atraumatic, no cyanosis or edema Pulses: 2+ and symmetric Skin: Skin color,  texture, turgor normal. 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 Laneta Simmers in 2007.  Her most recent 2D echo revealed her mitral valve prosthesis to be well-functioning.  She is on Coumadin anticoagulation.  HYPERLIPIDEMIA History of hyperlipidemia on statin therapy with lipid profile performed 09/03/2022 revealing total cholesterol 273 with an LDL of 165, not at goal for primary prevention.  Automatic implantable cardioverter-defibrillator in situ History of resynchronization therapy which has been followed by Dr. Royann Shivers .  She had a generator change in 2012 and again 04/28/2017.  This has resulted in normalization of her previously severely depressed LV function.  Dr. Royann Shivers continues to follow this.  Acute on chronic combined systolic and diastolic CHF (congestive heart failure) (HCC) History of severe LV dysfunction with recent echo performed during recent hospitalization/8/24 revealing EF of 20 to 25% with a well-functioning aortic mechanical prosthesis.  This represents a significant decline in EF compared to prior echoes.  She is on guideline directed optimal medical therapy and is followed in the heart failure clinic.  She did have a prolonged hospitalization 10/03/2022 for 12 days and was diuresed and medications were optimized.     Runell Gess MD FACP,FACC,FAHA, [facility] 11/20/2022 3:18 PM

## 2022-11-27 ENCOUNTER — Other Ambulatory Visit: Payer: Self-pay | Admitting: Physician Assistant

## 2022-12-01 ENCOUNTER — Ambulatory Visit: Payer: Medicare Other | Attending: Cardiology

## 2022-12-01 DIAGNOSIS — Z7901 Long term (current) use of anticoagulants: Secondary | ICD-10-CM

## 2022-12-01 LAB — POCT INR: INR: 2.2 (ref 2.0–3.0)

## 2022-12-01 NOTE — Patient Instructions (Signed)
Description   Take 1.5 tablets today and then continue taking 1 tablet daily.  Recheck INR in 3 weeks.  Coumadin Clinic [phone].  Stay consistent with 1-2 helping of greens per week.

## 2022-12-02 LAB — BASIC METABOLIC PANEL
BUN/Creatinine Ratio: 17 (ref 12–28)
BUN: 25 mg/dL (ref 8–27)
CO2: 28 mmol/L (ref 20–29)
Calcium: 9.4 mg/dL (ref 8.7–10.3)
Chloride: 98 mmol/L (ref 96–106)
Creatinine, Ser: 1.44 mg/dL — ABNORMAL HIGH (ref 0.57–1.00)
Glucose: 92 mg/dL (ref 70–99)
Potassium: 4.2 mmol/L (ref 3.5–5.2)
Sodium: 140 mmol/L (ref 134–144)
eGFR: 37 mL/min/{1.73_m2} — ABNORMAL LOW (ref >59)

## 2022-12-02 LAB — BRAIN NATRIURETIC PEPTIDE: BNP: 679.2 pg/mL — ABNORMAL HIGH (ref 0.0–100.0)

## 2022-12-10 ENCOUNTER — Telehealth: Payer: Self-pay | Admitting: Emergency Medicine

## 2022-12-10 DIAGNOSIS — Z79899 Other long term (current) drug therapy: Secondary | ICD-10-CM

## 2022-12-10 MED ORDER — SACUBITRIL-VALSARTAN 49-51 MG PO TABS: 1.0000 | ORAL_TABLET | Freq: Two times a day (BID) | ORAL | 1 refills | Status: DC

## 2022-12-10 NOTE — Progress Notes (Signed)
Remote ICD transmission.   

## 2022-12-10 NOTE — Telephone Encounter (Signed)
Message Received: Today Croitoru, Rachelle Hora, MD  Scheryl Marten, RN Please increase [name] to 49/51 mg twice daily and recheck a basic metabolic panel in about 3 weeks.  Thank you very much for the log of blood pressure and weights.  If she can drop that off again when she comes in to get her lab work, that would be great.  Gave the patient the message above. Will send in prescription to her pharmacy. She verbalized understanding of all instructions. She wishes to wait and see what her lab work, BP, and weights look like after this dosage change before scheduling another appointment. She states that the appt for 7/15 was canceled because she was told it was not needed.

## 2022-12-15 ENCOUNTER — Ambulatory Visit: Payer: Medicare Other | Admitting: Nurse Practitioner

## 2022-12-22 ENCOUNTER — Ambulatory Visit: Payer: Medicare Other

## 2022-12-22 ENCOUNTER — Telehealth: Payer: Self-pay | Admitting: Cardiovascular Disease

## 2022-12-22 DIAGNOSIS — Z7901 Long term (current) use of anticoagulants: Secondary | ICD-10-CM

## 2022-12-22 DIAGNOSIS — Z952 Presence of prosthetic heart valve: Secondary | ICD-10-CM | POA: Diagnosis not present

## 2022-12-22 LAB — POCT INR: INR: 1.6 — AB (ref 2.0–3.0)

## 2022-12-22 NOTE — Telephone Encounter (Signed)
Paper Work Dropped Off: Envelope   Date: 07.22.24  Location of paper: Dr [employer]

## 2022-12-22 NOTE — Patient Instructions (Signed)
Take 2 tablets today and then continue taking 1 tablet daily.  Recheck INR in 2 weeks.  Coumadin Clinic [phone].  Stay consistent with 1-2 helping of greens per week.

## 2022-12-24 NOTE — Telephone Encounter (Signed)
Informed patient that Dr C looked over her paperwork. He says: Weight is steady, BP looks good, and to continue same meds. She verbalized understanding.

## 2023-01-02 ENCOUNTER — Telehealth: Payer: Self-pay | Admitting: Cardiovascular Disease

## 2023-01-02 NOTE — Telephone Encounter (Signed)
I agree with decreasing back to the lower dose.

## 2023-01-02 NOTE — Telephone Encounter (Signed)
Patient aware of provider message. She verbalized understanding and stated she will continue to take. Currently not dizzy. However she states she is dizzy all the time but since the increase in dosage of entresto she feels its worse.

## 2023-01-02 NOTE — Telephone Encounter (Signed)
Patient stated she will continue with entresto 49-51 mg

## 2023-01-02 NOTE — Telephone Encounter (Signed)
Pt c/o medication issue:  1. Name of Medication:   sacubitril-valsartan (ENTRESTO) 49-51 MG   2. How are you currently taking this medication (dosage and times per day)? As prescribed  3. Are you having a reaction (difficulty breathing--STAT)?   Very dizzy  4. What is your medication issue?   Patient stated this medication is making her very dizzy when she goes to stand up.  Patient stated she is almost out of this medication and wants to stop taking it.

## 2023-01-02 NOTE — Telephone Encounter (Signed)
Called pt in regards to her [name] as it is making her very dizzy, especially when she stands up. Pt wants to stop taking it and is almost out of it. Please advise.

## 2023-01-05 ENCOUNTER — Ambulatory Visit: Payer: Medicare Other

## 2023-01-05 DIAGNOSIS — Z952 Presence of prosthetic heart valve: Secondary | ICD-10-CM

## 2023-01-05 DIAGNOSIS — Z7901 Long term (current) use of anticoagulants: Secondary | ICD-10-CM | POA: Diagnosis not present

## 2023-01-05 LAB — POCT INR: INR: 2 (ref 2.0–3.0)

## 2023-01-05 NOTE — Patient Instructions (Signed)
Take 2 tablets today only and then continue taking 1 tablet daily.  Recheck INR in 3 weeks.  Coumadin Clinic [phone].  Stay consistent with 1-2 helping of greens per week.

## 2023-01-26 ENCOUNTER — Ambulatory Visit: Payer: Medicare Other | Attending: Cardiology

## 2023-01-26 DIAGNOSIS — Z952 Presence of prosthetic heart valve: Secondary | ICD-10-CM

## 2023-01-26 DIAGNOSIS — Z7901 Long term (current) use of anticoagulants: Secondary | ICD-10-CM | POA: Diagnosis not present

## 2023-01-26 LAB — POCT INR: INR: 2.3 (ref 2.0–3.0)

## 2023-01-26 NOTE — Patient Instructions (Signed)
Increase to 1 tablet daily, except 1.5 tablets on Wednesdays. Recheck INR in 3 weeks.  Coumadin Clinic [phone].  Stay consistent with 1-2 helping of greens per week.

## 2023-01-29 ENCOUNTER — Other Ambulatory Visit: Payer: Self-pay | Admitting: Physician Assistant

## 2023-02-01 ENCOUNTER — Other Ambulatory Visit: Payer: Self-pay | Admitting: Cardiovascular Disease

## 2023-02-02 ENCOUNTER — Other Ambulatory Visit: Payer: Self-pay | Admitting: Cardiovascular Disease

## 2023-02-03 ENCOUNTER — Telehealth: Payer: Self-pay | Admitting: Emergency Medicine

## 2023-02-03 NOTE — Telephone Encounter (Signed)
Informed patient that she is overdue for bloodwork= BMP. She said she will come in tomorrow to get this drawn.

## 2023-02-04 ENCOUNTER — Other Ambulatory Visit: Payer: Self-pay

## 2023-02-04 DIAGNOSIS — Z79899 Other long term (current) drug therapy: Secondary | ICD-10-CM

## 2023-02-05 ENCOUNTER — Telehealth: Payer: Self-pay | Admitting: Cardiovascular Disease

## 2023-02-05 ENCOUNTER — Telehealth: Payer: Self-pay

## 2023-02-05 NOTE — Telephone Encounter (Signed)
Sounds like she is in the donut hole. Recommend patient print patient assistance application and sign the bottom of her section.  We can then complete the rest.   [email or web address]

## 2023-02-05 NOTE — Telephone Encounter (Signed)
Pt c/o medication issue:  1. Name of Medication: sacubitril-valsartan (ENTRESTO) 49-51 MG   2. How are you currently taking this medication (dosage and times per day)?   3. Are you having a reaction (difficulty breathing--STAT)?   4. What is your medication issue? Patient would like a call back to discuss medication and price. Requesting to call back to discuss options since the price has gone up and over $500. Please advise.

## 2023-02-05 NOTE — Telephone Encounter (Signed)
Spoke w/ pt. Pt reports a low tone that others can hear as well. No rhyme or reason she can think of w/ relation to potential magnetic fields. No vibration. No magnet response noted in transmission.    F/u call to offer 0900 device clinic apt. LMTCB

## 2023-02-05 NOTE — Telephone Encounter (Signed)
Returned pt call with pharmacies recommendation below. Office does not have samples in her strength. Will mail the patient assistance forms and pt states she will get them filled out and return to our officde.  Per Pharmacy-  Sounds like she is in the donut hole. Recommend patient print patient assistance application and sign the bottom of her section.  We can then complete the rest.

## 2023-02-05 NOTE — Telephone Encounter (Signed)
Pt husband called in stating the entresto is too expensive and they want to know if there is patient assistance available or another medicine she can try. They went to the pharmacy and it was 500$. Pt would like a call back regarding this

## 2023-02-05 NOTE — Telephone Encounter (Signed)
Pt husband called in stating pt device has vibrated in her chest and they have sent in a transmission and would like for someone to give them a call back

## 2023-02-05 NOTE — Telephone Encounter (Signed)
I spoke with the patient and got her scheduled for 02/06/2023 at 9 am with the device clinic. I told her I will let the nurse know I spoke with her and got the appointment scheduled. I made sure that she was aware that she is to come to the [address] Office.

## 2023-02-05 NOTE — Telephone Encounter (Signed)
Returned pt call and LVM that this nurse Will forward this message to pharmacy for recommendation.

## 2023-02-06 ENCOUNTER — Ambulatory Visit: Payer: Medicare Other | Attending: Internal Medicine

## 2023-02-06 DIAGNOSIS — I5032 Chronic diastolic (congestive) heart failure: Secondary | ICD-10-CM

## 2023-02-06 LAB — CUP PACEART INCLINIC DEVICE CHECK
Battery Remaining Longevity: 25 mo
Battery Voltage: 2.94 V
Brady Statistic AP VP Percent: 63 %
Brady Statistic AP VS Percent: 3.28 %
Brady Statistic AS VP Percent: 32.91 %
Brady Statistic AS VS Percent: 0.81 %
Brady Statistic RA Percent Paced: 65.9 %
Brady Statistic RV Percent Paced: 11.9 %
Date Time Interrogation Session: 20240906140711
HighPow Impedance: 45 Ohm
Implantable Lead Connection Status: 753985
Implantable Lead Connection Status: 753985
Implantable Lead Connection Status: 753985
Implantable Lead Implant Date: 20080221
Implantable Lead Implant Date: 20080221
Implantable Lead Implant Date: 20080221
Implantable Lead Location: 753858
Implantable Lead Location: 753859
Implantable Lead Location: 753860
Implantable Lead Model: 4543
Implantable Lead Model: 5076
Implantable Lead Model: 7002
Implantable Lead Serial Number: 134379
Implantable Pulse Generator Implant Date: 20181128
Lead Channel Impedance Value: 266 Ohm
Lead Channel Impedance Value: 342 Ohm
Lead Channel Impedance Value: 342 Ohm
Lead Channel Impedance Value: 399 Ohm
Lead Channel Impedance Value: 399 Ohm
Lead Channel Impedance Value: 646 Ohm
Lead Channel Pacing Threshold Amplitude: 0.75 V
Lead Channel Pacing Threshold Amplitude: 1.125 V
Lead Channel Pacing Threshold Amplitude: 1.25 V
Lead Channel Pacing Threshold Pulse Width: 0.4 ms
Lead Channel Pacing Threshold Pulse Width: 0.4 ms
Lead Channel Pacing Threshold Pulse Width: 0.6 ms
Lead Channel Sensing Intrinsic Amplitude: 2 mV
Lead Channel Sensing Intrinsic Amplitude: 2.25 mV
Lead Channel Sensing Intrinsic Amplitude: 29.875 mV
Lead Channel Sensing Intrinsic Amplitude: 31.5 mV
Lead Channel Setting Pacing Amplitude: 1.5 V
Lead Channel Setting Pacing Amplitude: 1.75 V
Lead Channel Setting Pacing Amplitude: 2.5 V
Lead Channel Setting Pacing Pulse Width: 0.4 ms
Lead Channel Setting Pacing Pulse Width: 0.6 ms
Lead Channel Setting Sensing Sensitivity: 1.2 mV
Zone Setting Status: 755011

## 2023-02-06 LAB — BASIC METABOLIC PANEL WITH GFR
BUN/Creatinine Ratio: 21 (ref 12–28)
BUN: 31 mg/dL — ABNORMAL HIGH (ref 8–27)
CO2: 24 mmol/L (ref 20–29)
Calcium: 9.4 mg/dL (ref 8.7–10.3)
Chloride: 102 mmol/L (ref 96–106)
Creatinine, Ser: 1.47 mg/dL — ABNORMAL HIGH (ref 0.57–1.00)
Glucose: 63 mg/dL — ABNORMAL LOW (ref 70–99)
Potassium: 4.1 mmol/L (ref 3.5–5.2)
Sodium: 144 mmol/L (ref 134–144)
eGFR: 36 mL/min/{1.73_m2} — ABNORMAL LOW

## 2023-02-06 NOTE — Progress Notes (Signed)
  Patient seen in device clinic today who reports her ICD is making a monotone alarm sound. Device checked and no alerts noted. Patient already had therapies turned off prior to today ov but I contacted [facility] as alerts were still programmed on. See attachment for further details.

## 2023-02-07 ENCOUNTER — Other Ambulatory Visit: Payer: Self-pay | Admitting: Physician Assistant

## 2023-02-10 ENCOUNTER — Ambulatory Visit: Payer: Medicare Other | Attending: Cardiovascular Disease | Admitting: Cardiovascular Disease

## 2023-02-10 ENCOUNTER — Encounter: Payer: Self-pay | Admitting: Cardiovascular Disease

## 2023-02-10 VITALS — BP 144/52 | HR 72 | Ht 62.0 in | Wt 96.2 lb

## 2023-02-10 DIAGNOSIS — D6869 Other thrombophilia: Secondary | ICD-10-CM

## 2023-02-10 DIAGNOSIS — Z95810 Presence of automatic (implantable) cardiac defibrillator: Secondary | ICD-10-CM | POA: Diagnosis not present

## 2023-02-10 DIAGNOSIS — N1832 Chronic kidney disease, stage 3b: Secondary | ICD-10-CM

## 2023-02-10 DIAGNOSIS — I5042 Chronic combined systolic (congestive) and diastolic (congestive) heart failure: Secondary | ICD-10-CM | POA: Diagnosis not present

## 2023-02-10 DIAGNOSIS — I493 Ventricular premature depolarization: Secondary | ICD-10-CM

## 2023-02-10 DIAGNOSIS — E782 Mixed hyperlipidemia: Secondary | ICD-10-CM

## 2023-02-10 DIAGNOSIS — I480 Paroxysmal atrial fibrillation: Secondary | ICD-10-CM

## 2023-02-10 MED ORDER — TRAMADOL HCL 50 MG PO TABS
50.0000 mg | ORAL_TABLET | Freq: Three times a day (TID) | ORAL | 0 refills | Status: DC | PRN
Start: 2023-02-10 — End: 2023-10-24

## 2023-02-10 MED ORDER — VALSARTAN 160 MG PO TABS: 160.0000 mg | ORAL_TABLET | Freq: Every day | ORAL | 3 refills | Status: DC

## 2023-02-10 NOTE — Progress Notes (Signed)
Cardiology office note    Date:  02/10/2023   ID:  [name], [place] 1941-02-09, MRN [id number]  PCP:  Garlan Fillers, MD  Cardiologist: Nanetta Batty, MD  Electrophysiologist:  Lanier Prude, MD   Evaluation Performed:  Follow-Up Visit  Chief Complaint:  ICD alarm  History of Present Illness:    [name] is a 82 y.o. female with  mechanical mitral valve replacement (repair 2002, replacement 2007,  biventricular pacemaker/defibrillator  (Medtronic [place], generator changeout in November 2018) and chronic heart failure.    The alarm on her defibrillator has been going off repeatedly over the last several days.  It can go off at random times of the day and random locations, including once when she was in the car.  Demonstrated the different tones of the defibrillator can produce.  She is not receiving a hypercardia low party alert, but was receiving the magnet proximity tone.  We discovered that the magnetic clasp on her hand bag sets the alarm off when she puts the hand bag over her shoulder.  She is not sure that she is always had a hand bag with her whenever the alarm has gone off, but will monitor this at home.  All the high-voltage alerts on her ICD lead and better with are turned off anyway due to problems with over sensing on the right ventricular lead.  The only alerts that are still on are those for battery at RRT and RA/LV lead impedance alerts.  She is in the donut hole for her Medicare prescriptions and cannot afford Entresto with a higher co-pay.  We are applying for the [employer], but she is currently down to 1 last dose of Entresto.  Otherwise overall is doing well without significant dyspnea at rest or with activity orthopnea, PND, leg edema, dizziness, palpitations or syncope.  She weighs 96 pounds today which is just slightly above our previous estimated "dry weight" of 94 pounds, but estimates that she may have gained back a  little bit of real weight since she is eating better.  OptiVol has been out of range since June.  Most recent labs from just a few days ago show a creatinine of 1.47, which is probably her baseline.  When she was aggressively diuresed in May her creatinine was as high as 2.36.  ICD function is otherwise normal.  Estimated generator longevity is 2.1 years.  Right atrial and left ventricular lead impedance, sensing and capture thresholds are all stable and in nominal range.  There is normal sensing, capture threshold and pace/sense lead impedance on the RV lead.  Ventricular tachyarrhythmia therapies are all off.  Total ventricular pacing has been 95.8%, effective biventricular pacing 95.7%.  There has been no atrial fibrillation and there have been no episodes of high ventricular rate.  Also has roughly 92% atrial pacing, with rather blunted heart rate histogram distribution (she is quite sedentary).  She was hospitalized 5/3 2024 with acute on chronic heart failure, and echocardiogram showed further drop in EF to 20-25%.  There was normal function of the mitral prosthesis and the right ventricle appear to be normal.  She underwent a left heart catheterization which showed mild CAD without any obstructive lesions.  The right heart catheterization after diuresis showed normal filling pressures (RA 3, mean PA 23, mean PAWP 15, normal cardiac index 2.6).  [name] was started but subsequently discontinued due to increased serum creatinine.  Dry weight is estimated to be 90 pounds.  Follow-up in the [facility] clinic on 10/29/2022 showed improvement in serum creatinine to 1.2, weight 94 pounds.  Optivol (which had improved) was trending back upward and approaching threshold.  The cause of  worsening left ventricular systolic function is not immediately clear, but interrogation of her biventricular device did show reduction in effective biventricular pacing from 93% to only 89%. When she saw Dr. Lalla Brothers in EP consultation  in July 2023 he increased her dose of beta-blocker to help suppress the episodes of ventricular sensed beats, mostly due to periods of junctional rhythm.  Anemia seemed to play a role in the increasing burden of accelerated junctional rhythm.  Today, device interrogation shows improvement in overall biventricular pacing to 96% (effective resynchronization 94.7%).  Presenting rhythm is atrial paced-biventricular paced rhythm is not had any atrial fibrillation or ventricular tachycardia.  OptiVol has been up and down a lot over the last 12 months and she is currently still "wet" according to the thoracic impedance tracing.  She remains extremely lean/undernourished, currently with a BMI just over 18.  Pacemaker interrogation shows normal device function.  Generator longevity is estimated at 2.8 years.  She has 92% atrial pacing and 97.2% effective biventricular pacing, both of which are substantially higher compared with historical trends.  She has not had any ventricular tachycardia or atrial fibrillation in the last 3 months.  Her thoracic impedance/OptiVol has been elevated since the beginning of November but is trending 2 words improvement and is almost back to baseline.  Estimated gentle Ingevity is 2.3 years, most lead parameters are good and there is clear evidence of appropriate LV lead capture.  The twelve-lead ECG shows distinct positive R wave in lead V1-V2 and the QRS is relatively narrow.  Unfortunately, due to "noise" on the RV pace sense channel ventricular arrhythmia detection and therapies are turned off.  In early 2020 she began developing" noise" on the right ventricular pace-sense channel.  She had recovered left ventricular systolic function to an ejection fraction of 50-55% and she has never received tachycardia therapies.  Ventricular arrhythmia detection and therapies are turned off.  Echo performed in March 2022 shows stable left ventricular ejection fraction of 45-50%.  The mean  mitral valve gradient is 4 mmHg at a heart rate of 86 bpm which is also stable. Echo repeated 10/30/2021 shows reduced LVEF down to 30-35%.  This is due to global hypokinesis.  Mitral valve prosthetic function is normal.  Mean gradient 3 mmHg at 70 bpm.   Unfortunately, [name] has lost both her sons to substance abuse at the age of 53.  She is raising her 72 year old grandson, since both of his parents died by overdose.  She has a great relationship with her other grandchild a 84 year old granddaughter who is living independently.  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 Left ventricular ejection fraction which has been as low as 10% improved substantially after implantation of the CRT-D device and now her LVEF is near normal. She received her initial CRT-D in 2008 and underwent a generator change out in 2012 and 2019. She has not received VT therapy. She has a Riata 7 [place] ICD lead under advisory, which appeared fluoroscopically normal at the time of last generator change.  Did not have any angiographic CAD prior to her surgery.     The atrial lead is a Medtronic [id number] serial number [id number] H implanted  in July 23, 2006 [name]) The right ventricular lead is a Development worker, international aid [id number] serial number [id number] implanted same date The left ventricular lead is a Guidant BS CI 4543 LV lead, serial [id number] implanted same date She had redundant Medtronic left ventricular epicardial pacing leads implanted at the time of surgery in 2007. There is over sensing/"noise" on the RV pace/sense lead.   Past Medical History:  Diagnosis Date   Allergy    seasonal   Anemia    Anxiety    Arthritis    Asthma    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 Protecta   Biventricular ICD (implantable cardioverter-defibrillator) in place     with CRT   Blood transfusion without reported diagnosis    Bursitis    Cataract    RIGHT EYE   CHF (congestive heart failure) (HCC)    Chronic kidney disease    Colon polyp    adenomatous   Complication of anesthesia    patient stated that had difficulty getting the breathing tube removed, patient said that she stopped breathing and HR dropped to 10  patient then woke up and started breathing pateint stated no longer than one minute; re-intubated in PACU following cholecystectomy 10/28/13   COPD (chronic obstructive pulmonary disease) (HCC)    Depression    Diverticulosis    Dysrhythmia    Fibromyalgia    GERD (gastroesophageal reflux disease)    Gout    H/O mitral valve replacement 2002, 2007   Heart murmur    Hemorrhoids    Hyperlipidemia    Hypertension    Hypothyroidism    IBS (irritable bowel syndrome)    MVP (mitral valve prolapse)    NAUSEA AND VOMITING 08/01/2009   Neuromuscular disorder (HCC)    fibromyalgia   Pacemaker    Peptic ulcer disease    Past Surgical History:  Procedure Laterality Date   ABDOMINAL HYSTERECTOMY     BIV ICD GENERATOR CHANGEOUT N/A 04/29/2017   Procedure: BIV ICD GENERATOR CHANGEOUT;  Surgeon: Thurmon Fair, MD;  Location: [facility] INVASIVE CV LAB;  Service: Cardiovascular;  Laterality: N/A;   CARDIAC CATHETERIZATION  02/25/2006   normal left main, normal LAD, normal L Cfx, normal/dominant RCA (Dr. Erlene Quan)   CARDIAC DEFIBRILLATOR PLACEMENT  2007, 11/202012   x2 (pacemaker) (Dr. Judie Petit. Lynanne Delgreco)   CARDIAC VALVE REPLACEMENT  2002   MV repair - Dr. Lisabeth Register   CHOLECYSTECTOMY N/A 10/28/2013   Procedure: LAPAROSCOPIC CHOLECYSTECTOMY WITH INTRAOPERATIVE CHOLANGIOGRAM;  Surgeon: Mariella Saa, MD;  Location: [facility] OR;  Service: General;  Laterality: N/A;   COLONOSCOPY     ENTEROSCOPY N/A 11/09/2021   Procedure: ENTEROSCOPY;  Surgeon: Lemar Lofty., MD;  Location: [facility] ENDOSCOPY;  Service: Gastroenterology;  Laterality: N/A;    ESOPHAGOGASTRODUODENOSCOPY (EGD) WITH PROPOFOL N/A 03/02/2021   Procedure: ESOPHAGOGASTRODUODENOSCOPY (EGD) WITH PROPOFOL;  Surgeon: Jenel Lucks, MD;  Location: [facility] ENDOSCOPY;  Service: Gastroenterology;  Laterality: N/A;   EYE SURGERY     HEMOSTASIS CLIP PLACEMENT  03/02/2021   Procedure: HEMOSTASIS CLIP PLACEMENT;  Surgeon: Jenel Lucks, MD;  Location: [facility] ENDOSCOPY;  Service: Gastroenterology;;   HEMOSTASIS CLIP PLACEMENT  11/09/2021   Procedure: HEMOSTASIS CLIP PLACEMENT;  Surgeon: Lemar Lofty., MD;  Location: [facility] ENDOSCOPY;  Service: Gastroenterology;;   HOT HEMOSTASIS  03/02/2021   Procedure: HOT HEMOSTASIS (ARGON PLASMA COAGULATION/BICAP);  Surgeon: Jenel Lucks, MD;  Location: [facility] ENDOSCOPY;  Service: Gastroenterology;;   HOT HEMOSTASIS N/A 11/09/2021  Procedure: HOT HEMOSTASIS (ARGON PLASMA COAGULATION/BICAP);  Surgeon: Lemar Lofty., MD;  Location: [facility] ENDOSCOPY;  Service: Gastroenterology;  Laterality: N/A;   IMPLANTABLE CARDIOVERTER DEFIBRILLATOR (ICD) GENERATOR CHANGE N/A 04/10/2011   Procedure: ICD GENERATOR CHANGE;  Surgeon: Thurmon Fair, MD;  Location: [facility] CATH LAB;  Service: Cardiovascular;  Laterality: N/A;   INSERT / REPLACE / REMOVE PACEMAKER     KNEE ARTHROSCOPY     MITRAL VALVE REPLACEMENT  02/26/2006   re-do MVR w/6mm St. [name] (Dr. Wayland Salinas)   NM MYOCAR PERF WALL MOTION  2005   persantine myoview - low ris, EF 63%   RIGHT HEART CATH  04/03/2006   pulm cap wedge pressure 24/24, PA pressure 43/22 (mean ), CO 4.8, CI 4.1 (Dr. Evlyn Courier)   RIGHT/LEFT HEART CATH AND CORONARY ANGIOGRAPHY N/A 10/08/2022   Procedure: RIGHT/LEFT HEART CATH AND CORONARY ANGIOGRAPHY;  Surgeon: Lennette Bihari, MD;  Location: [facility] INVASIVE CV LAB;  Service: Cardiovascular;  Laterality: N/A;   SCLEROTHERAPY  03/02/2021   Procedure: SCLEROTHERAPY;  Surgeon: Jenel Lucks, MD;  Location: [facility] ENDOSCOPY;  Service: Gastroenterology;;   SUBMUCOSAL TATTOO INJECTION   11/09/2021   Procedure: SUBMUCOSAL TATTOO INJECTION;  Surgeon: Lemar Lofty., MD;  Location: [facility] ENDOSCOPY;  Service: Gastroenterology;;   TOTAL SHOULDER ARTHROPLASTY Right 12/27/2015   Procedure: RIGHT TOTAL SHOULDER ARTHROPLASTY;  Surgeon: Jones Broom, MD;  Location: [facility] OR;  Service: Orthopedics;  Laterality: Right;  Right total shoulder arthroplasty   TOTAL SHOULDER ARTHROPLASTY Left 10/27/2019   Procedure: REVERSE TOTAL SHOULDER ARTHROPLASTY;  Surgeon: Jones Broom, MD;  Location: [facility] ORS;  Service: Orthopedics;  Laterality: Left;   TRANSTHORACIC ECHOCARDIOGRAM  12/2011   EF 50-55%, mild global hypokinesis; LA severely dilated; calcification of anterior/posterior MV leaflets, bi-leaflets St. [name] mechanical MV; mild TR; trace AV regurg/pulm valve regurg     Current Meds  Medication Sig   acetaminophen (TYLENOL) 650 MG CR tablet Take 650 mg by mouth every 8 (eight) hours as needed for pain.   allopurinol (ZYLOPRIM) 300 MG tablet Take 300 mg by mouth every evening.    ALPRAZolam (XANAX) 0.25 MG tablet Take 0.25 mg by mouth at bedtime as needed for anxiety or sleep.    antiseptic oral rinse (BIOTENE) LIQD 15 mLs by Mouth Rinse route as needed for dry mouth.   buPROPion (WELLBUTRIN SR) 150 MG 12 hr tablet Take 150 mg by mouth in the morning.   esomeprazole (NEXIUM) 40 MG capsule TAKE 1 CAPSULE BY MOUTH EVERY DAY BEFORE BREAKFAST   ferrous sulfate 325 (65 FE) MG tablet Take 1 tablet (325 mg total) by mouth daily with breakfast.   furosemide (LASIX) 20 MG tablet Take 20 mg by mouth 2 (two) times daily.   icosapent Ethyl (VASCEPA) 1 g capsule Take 1 capsule (1 g total) by mouth 2 (two) times daily.   levothyroxine (SYNTHROID) 75 MCG tablet Take 75 mcg by mouth daily before breakfast.   memantine (NAMENDA) 5 MG tablet Take 5 mg by mouth every morning.   metoprolol succinate (TOPROL-XL) 50 MG 24 hr tablet Take 0.5 tablets (25 mg total) by mouth every morning AND 1 tablet (50 mg total)  every evening. Take with or immediately following a meal..   Multiple Vitamin (MULTIVITAMIN WITH MINERALS) TABS tablet Take 1 tablet by mouth daily.   pravastatin (PRAVACHOL) 40 MG tablet Take 40 mg at bedtime by mouth.    sacubitril-valsartan (ENTRESTO) 49-51 MG TAKE 1 TABLET BY MOUTH TWICE A DAY   Sennosides (SENNA)  15 MG TABS Take 30 mg by mouth at bedtime.   spironolactone (ALDACTONE) 25 MG tablet Take 1 tablet (25 mg total) by mouth daily.   traMADol (ULTRAM) 50 MG tablet Take 1 tablet (50 mg total) by mouth every 8 (eight) hours as needed for moderate pain or severe pain (1 tablet every 8 hours as needed for pain).   traZODone (DESYREL) 50 MG tablet Take 25 mg by mouth at bedtime as needed.   valsartan (DIOVAN) 160 MG tablet Take 1 tablet (160 mg total) by mouth daily.   VOLTAREN 1 % GEL Apply 2-4 g topically in the morning and at bedtime.   warfarin (COUMADIN) 2.5 MG tablet TAKE 1 TO 2 TABLETS BY MOUTH DAILY AS DIRECTED BY THE COUMADIN CLINIC   Current Facility-Administered Medications for the 02/10/23 encounter (Office Visit) with Wilfred Siverson, Rachelle Hora, MD  Medication   0.9 %  sodium chloride infusion     Allergies:   Ace inhibitors, Lipitor [atorvastatin calcium], Megace [megestrol], Aspirin, Clorazepate dipotassium, Simvastatin, Tiotropium bromide monohydrate, Codeine, and Tape   Social History   Tobacco Use   Smoking status: Former    Current packs/day: 0.00    Types: Cigarettes    Quit date: 06/02/2001    Years since quitting: 21.7   Smokeless tobacco: Never  Vaping Use   Vaping status: Never Used  Substance Use Topics   Alcohol use: Yes    Alcohol/week: 0.0 standard drinks of alcohol    Comment: social-1 every 6 months   Drug use: No     Family Hx: The patient's family history includes Breast cancer in her sister; Crohn's disease in her sister; Heart attack in her father and mother; Heart disease in her father, mother, sister, and son; Kidney disease in her father; Uterine  cancer in her sister. There is no history of Colon cancer or Stomach cancer.  ROS:   Please see the history of present illness.    All other systems are reviewed and are negative.  Prior CV studies:   The following studies were reviewed today: 10/30/2021 ECHO  1. Left ventricular ejection fraction, by estimation, is 30 to 35%. The  left ventricle has moderately decreased function. The left ventricle  demonstrates global hypokinesis. Left ventricular diastolic parameters are  indeterminate.   2. Right ventricular systolic function is normal. The right ventricular  size is normal. Tricuspid regurgitation signal is inadequate for assessing  PA pressure.   3. Left atrial size was mildly dilated.   4. The mitral valve has been repaired/replaced. Trivial mitral valve  regurgitation. The mean mitral valve gradient is 3.0 mmHg with average  heart rate of 70 bpm. There is a 25 mm St. [name] mechanical valve present  in the mitral position. Procedure Date:  02/26/2006.   5. The aortic valve is tricuspid. Aortic valve regurgitation is mild.  Aortic valve sclerosis is present, with no evidence of aortic valve  stenosis.   6. The inferior vena cava is normal in size with greater than 50%  respiratory variability, suggesting right atrial pressure of 3 mmHg.        Labs/Other Tests and Data Reviewed:    EKG: Personally reviewed the tracing from 11/17/2022 which shows A paced V paced rhythm with a distinct positive R wave in lead V1, QRS 164 ms, QTc 515 ms Recent Labs: 10/08/2022: Magnesium 2.2 10/09/2022: ALT 85; TSH 2.151 10/12/2022: Hemoglobin 9.2; Platelets 189 12/01/2022: BNP 679.2 02/04/2023: BUN 31; Creatinine, Ser 1.47; Potassium 4.1; Sodium 144  Recent Lipid Panel Lab Results  Component Value Date/Time   CHOL 273 (H) 09/03/2022 11:08 AM   TRIG 383 (H) 09/03/2022 11:08 AM   HDL 34 (L) 09/03/2022 11:08 AM   CHOLHDL 8.0 (H) 09/03/2022 11:08 AM   CHOLHDL 8.2 12/09/2006 04:10 AM   LDLCALC  165 (H) 09/03/2022 11:08 AM   LDLDIRECT 160 (H) 09/03/2022 11:08 AM    11/23/2020 Total cholesterol 198, HDL 27, LDL 108, triglycerides 363  01/16/2022 Cholesterol 222, HDL 28, LDL 121, triglycerides 367  Wt Readings from Last 3 Encounters:  02/10/23 96 lb 3.2 oz (43.6 kg)  11/20/22 89 lb (40.4 kg)  11/17/22 91 lb 12.8 oz (41.6 kg)     Objective:    Vital Signs:  BP (!) 144/52 (BP Location: Left Arm, Patient Position: Sitting, Cuff Size: Small) Comment (Cuff Size): child cuff  Pulse 72   Ht 5\' 2"  (1.575 m)   Wt 96 lb 3.2 oz (43.6 kg)   SpO2 96%   BMI 17.60 kg/m      General: Alert, oriented x3, no distress, underweight.  Prominent but healthy ICD site Head: no evidence of trauma, PERRL, EOMI, no exophtalmos or lid lag, no myxedema, no xanthelasma; normal ears, nose and oropharynx Neck: normal jugular venous pulsations and no hepatojugular reflux; brisk carotid pulses without delay and no carotid bruits Chest: clear to auscultation, no signs of consolidation by percussion or palpation, normal fremitus, symmetrical and full respiratory excursions Cardiovascular: normal position and quality of the apical impulse, regular rhythm, normal first and second heart sounds, faint aortic ejection murmur, no diastolic murmurs, rubs or gallops Abdomen: no tenderness or distention, no masses by palpation, no abnormal pulsatility or arterial bruits, normal bowel sounds, no hepatosplenomegaly Extremities: no clubbing, cyanosis or edema; 2+ radial, ulnar and brachial pulses bilaterally; 2+ right femoral, posterior tibial and dorsalis pedis pulses; 2+ left femoral, posterior tibial and dorsalis pedis pulses; no subclavian or femoral bruits Neurological: grossly nonfocal Psych: Normal mood and affect     ASSESSMENT & PLAN:    CHF: Her thoracic impedance is out of range for the last 3 months, but clinically she does not have signs of hypervolemia. On a stable dose of furosemide 40 mg twice  daily.    LVEF has reached further to 25% and the exact cause of this is uncertain.  She did fine when we restarted her Entresto, but now cannot afford it any longer.  Will give her a temporary prescription for valsartan 160 mg daily until we can secure coverage for the Entresto.  Continue to watch weight as she would lose some diuretic effect from the [facility].  Continue beta-blocker for PVC suppression/junctional rhythm suppression to allow good cardiac resynchronization.  She has a lot of problems with arthritic pain in her knees and Tylenol does not really help.  She knows that she needs to avoid NSAIDs due to her renal dysfunction and chronic warfarin anticoagulation.  Give her prescription for Ultram 50 mg every 8 hours as needed.  If this works, asked her to obtain future refills from primary care provider. Anticoagulation: INR 2.3 a few days ago, no active bleeding issues CRT-D: She was not receiving true alarms but simply a notification for magnet proximity.  Her hand bag seems to be the culprit.  They will check for other possible magnetic devices around the house that could have triggered the alert.  Defibrillator therapies are not active on her device.  Problems with oversensing on her right ventricular lead and  recovery of LV function have led to discontinuation of tachycardia therapies.  She now has diminished left ventricular ejection fraction again.  She has never required tachycardia therapies.   Dr. Lalla Brothers did not think she was a good candidate for lead revision. PVCs/junctional rhythm: Both of these have interfered with CRT in the past.  Continue beta-blockers AFib: No true atrial fibrillation seen recently.  She had some episodes of artifactual mode switch that improved after we change her atrial sensing parameters. CKD 3: Most recent creatinine is 1.47, slightly above the previous baseline of 1.22-1.25 (peaked at 2.36 following diuresis during her hospitalization in May). Mixed  hyperlipidemia: She does not have known CAD or PAD.  Currently on pravastatin 40 mg at bedtime, intolerant of more active statins.  Despite her very slender weight she also has significant hypertriglyceridemia, but not in the range we would be concerned about pancreatitis.  She is on Vascepa.  Patient Instructions  Medication Instructions:  Valsartan 160 mg daily (IN PLACE OF ENTRESTO- until you are able to get back on Entresto) Tramadol 50 mg- 1 tablet every 8 hrs as needed for pain *If you need a refill on your cardiac medications before your next appointment, please call your pharmacy*  Follow-Up: At [facility], you and your health needs are our priority.  As part of our continuing mission to provide you with exceptional heart care, we have created designated Provider Care Teams.  These Care Teams include your primary Cardiologist (physician) and Advanced Practice Providers (APPs -  Physician Assistants and Nurse Practitioners) who all work together to provide you with the care you need, when you need it.  We recommend signing up for the patient portal called "MyChart".  Sign up information is provided on this After Visit Summary.  MyChart is used to connect with patients for Virtual Visits (Telemedicine).  Patients are able to view lab/test results, encounter notes, upcoming appointments, etc.  Non-urgent messages can be sent to your provider as well.   To learn more about what you can do with MyChart, go to [email or web address].    Your next appointment:   Follow up as previously scheduled   Signed, Thurmon Fair, MD  02/10/2023 11:38 AM    [facility]

## 2023-02-10 NOTE — Patient Instructions (Addendum)
Medication Instructions:  Valsartan 160 mg daily (IN PLACE OF ENTRESTO- until you are able to get back on Entresto) Tramadol 50 mg- 1 tablet every 8 hrs as needed for pain *If you need a refill on your cardiac medications before your next appointment, please call your pharmacy*  Follow-Up: At [facility], you and your health needs are our priority.  As part of our continuing mission to provide you with exceptional heart care, we have created designated Provider Care Teams.  These Care Teams include your primary Cardiologist (physician) and Advanced Practice Providers (APPs -  Physician Assistants and Nurse Practitioners) who all work together to provide you with the care you need, when you need it.  We recommend signing up for the patient portal called "MyChart".  Sign up information is provided on this After Visit Summary.  MyChart is used to connect with patients for Virtual Visits (Telemedicine).  Patients are able to view lab/test results, encounter notes, upcoming appointments, etc.  Non-urgent messages can be sent to your provider as well.   To learn more about what you can do with MyChart, go to [email or web address].    Your next appointment:   Follow up as previously scheduled

## 2023-02-11 ENCOUNTER — Telehealth: Payer: Self-pay | Admitting: Cardiovascular Disease

## 2023-02-11 NOTE — Telephone Encounter (Signed)
Faxed PAP to [employer] for [employer]

## 2023-02-11 NOTE — Telephone Encounter (Signed)
Patient Dropped forms off and will be in [place] box.

## 2023-02-16 ENCOUNTER — Ambulatory Visit: Payer: Medicare Other | Attending: Cardiology

## 2023-02-16 DIAGNOSIS — Z952 Presence of prosthetic heart valve: Secondary | ICD-10-CM | POA: Diagnosis not present

## 2023-02-16 DIAGNOSIS — Z7901 Long term (current) use of anticoagulants: Secondary | ICD-10-CM

## 2023-02-16 LAB — POCT INR: INR: 1.7 — AB (ref 2.0–3.0)

## 2023-02-16 NOTE — Patient Instructions (Signed)
TAKE 2 TABLETS TODAY ONLY THEN CONTINUE 1 tablet daily, except 1.5 tablets on Wednesdays. Recheck INR in 2 weeks.  Coumadin Clinic [phone].  Stay consistent with 1-2 helping of greens per week.

## 2023-02-17 ENCOUNTER — Ambulatory Visit: Payer: Medicare Other

## 2023-02-17 DIAGNOSIS — I5042 Chronic combined systolic (congestive) and diastolic (congestive) heart failure: Secondary | ICD-10-CM | POA: Diagnosis not present

## 2023-02-17 DIAGNOSIS — I480 Paroxysmal atrial fibrillation: Secondary | ICD-10-CM

## 2023-02-17 LAB — CUP PACEART REMOTE DEVICE CHECK
Battery Remaining Longevity: 26 mo
Battery Voltage: 2.93 V
Brady Statistic AP VP Percent: 90.49 %
Brady Statistic AP VS Percent: 2.55 %
Brady Statistic AS VP Percent: 6.75 %
Brady Statistic AS VS Percent: 0.22 %
Brady Statistic RA Percent Paced: 92.55 %
Brady Statistic RV Percent Paced: 17.96 %
Date Time Interrogation Session: 20240915012503
HighPow Impedance: 49 Ohm
Implantable Lead Connection Status: 753985
Implantable Lead Connection Status: 753985
Implantable Lead Connection Status: 753985
Implantable Lead Implant Date: 20080221
Implantable Lead Implant Date: 20080221
Implantable Lead Implant Date: 20080221
Implantable Lead Location: 753858
Implantable Lead Location: 753859
Implantable Lead Location: 753860
Implantable Lead Model: 4543
Implantable Lead Model: 5076
Implantable Lead Model: 7002
Implantable Lead Serial Number: 134379
Implantable Pulse Generator Implant Date: 20181128
Lead Channel Impedance Value: 304 Ohm
Lead Channel Impedance Value: 342 Ohm
Lead Channel Impedance Value: 380 Ohm
Lead Channel Impedance Value: 399 Ohm
Lead Channel Impedance Value: 399 Ohm
Lead Channel Impedance Value: 665 Ohm
Lead Channel Pacing Threshold Amplitude: 0.75 V
Lead Channel Pacing Threshold Amplitude: 0.75 V
Lead Channel Pacing Threshold Amplitude: 1.375 V
Lead Channel Pacing Threshold Pulse Width: 0.4 ms
Lead Channel Pacing Threshold Pulse Width: 0.4 ms
Lead Channel Pacing Threshold Pulse Width: 0.6 ms
Lead Channel Sensing Intrinsic Amplitude: 1.875 mV
Lead Channel Sensing Intrinsic Amplitude: 1.875 mV
Lead Channel Sensing Intrinsic Amplitude: 26.625 mV
Lead Channel Sensing Intrinsic Amplitude: 26.625 mV
Lead Channel Setting Pacing Amplitude: 1.5 V
Lead Channel Setting Pacing Amplitude: 2 V
Lead Channel Setting Pacing Amplitude: 2.75 V
Lead Channel Setting Pacing Pulse Width: 0.4 ms
Lead Channel Setting Pacing Pulse Width: 0.6 ms
Lead Channel Setting Sensing Sensitivity: 1.2 mV
Zone Setting Status: 755011

## 2023-02-20 ENCOUNTER — Ambulatory Visit (HOSPITAL_COMMUNITY): Payer: Medicare Other | Attending: Cardiovascular Disease

## 2023-02-20 DIAGNOSIS — I480 Paroxysmal atrial fibrillation: Secondary | ICD-10-CM | POA: Diagnosis not present

## 2023-02-20 DIAGNOSIS — I5042 Chronic combined systolic (congestive) and diastolic (congestive) heart failure: Secondary | ICD-10-CM | POA: Diagnosis not present

## 2023-02-20 DIAGNOSIS — Z952 Presence of prosthetic heart valve: Secondary | ICD-10-CM | POA: Insufficient documentation

## 2023-02-20 LAB — ECHOCARDIOGRAM COMPLETE
Area-P 1/2: 2.91 cm2
MV VTI: 1.88 cm2
P 1/2 time: 400 msec
S' Lateral: 4.8 cm

## 2023-02-23 ENCOUNTER — Encounter: Payer: Self-pay | Admitting: *Deleted

## 2023-02-23 ENCOUNTER — Other Ambulatory Visit: Payer: Self-pay | Admitting: *Deleted

## 2023-02-23 DIAGNOSIS — Z952 Presence of prosthetic heart valve: Secondary | ICD-10-CM

## 2023-02-24 ENCOUNTER — Other Ambulatory Visit: Payer: Self-pay | Admitting: Cardiovascular Disease

## 2023-03-02 ENCOUNTER — Ambulatory Visit: Payer: Medicare Other | Attending: Internal Medicine | Admitting: *Deleted

## 2023-03-02 DIAGNOSIS — Z952 Presence of prosthetic heart valve: Secondary | ICD-10-CM | POA: Diagnosis not present

## 2023-03-02 DIAGNOSIS — Z7901 Long term (current) use of anticoagulants: Secondary | ICD-10-CM | POA: Diagnosis not present

## 2023-03-02 LAB — POCT INR: INR: 2.3 (ref 2.0–3.0)

## 2023-03-02 NOTE — Patient Instructions (Signed)
Description   TAKE 1.5 TABLETS TODAY THEN START taking 1 tablet daily, except 1.5 tablets on Wednesdays and Saturdays. Recheck INR in 2 weeks.  Coumadin Clinic [phone].  Stay consistent with 1-2 helping of greens per week.

## 2023-03-03 IMAGING — DX DG CHEST 1V PORT
1 series · 1 of 1 positions shown · non-contrast
Comparison: October 20, 2019

CLINICAL DATA: SOB anemia

EXAM:
PORTABLE CHEST 1 VIEW

[chest ap]
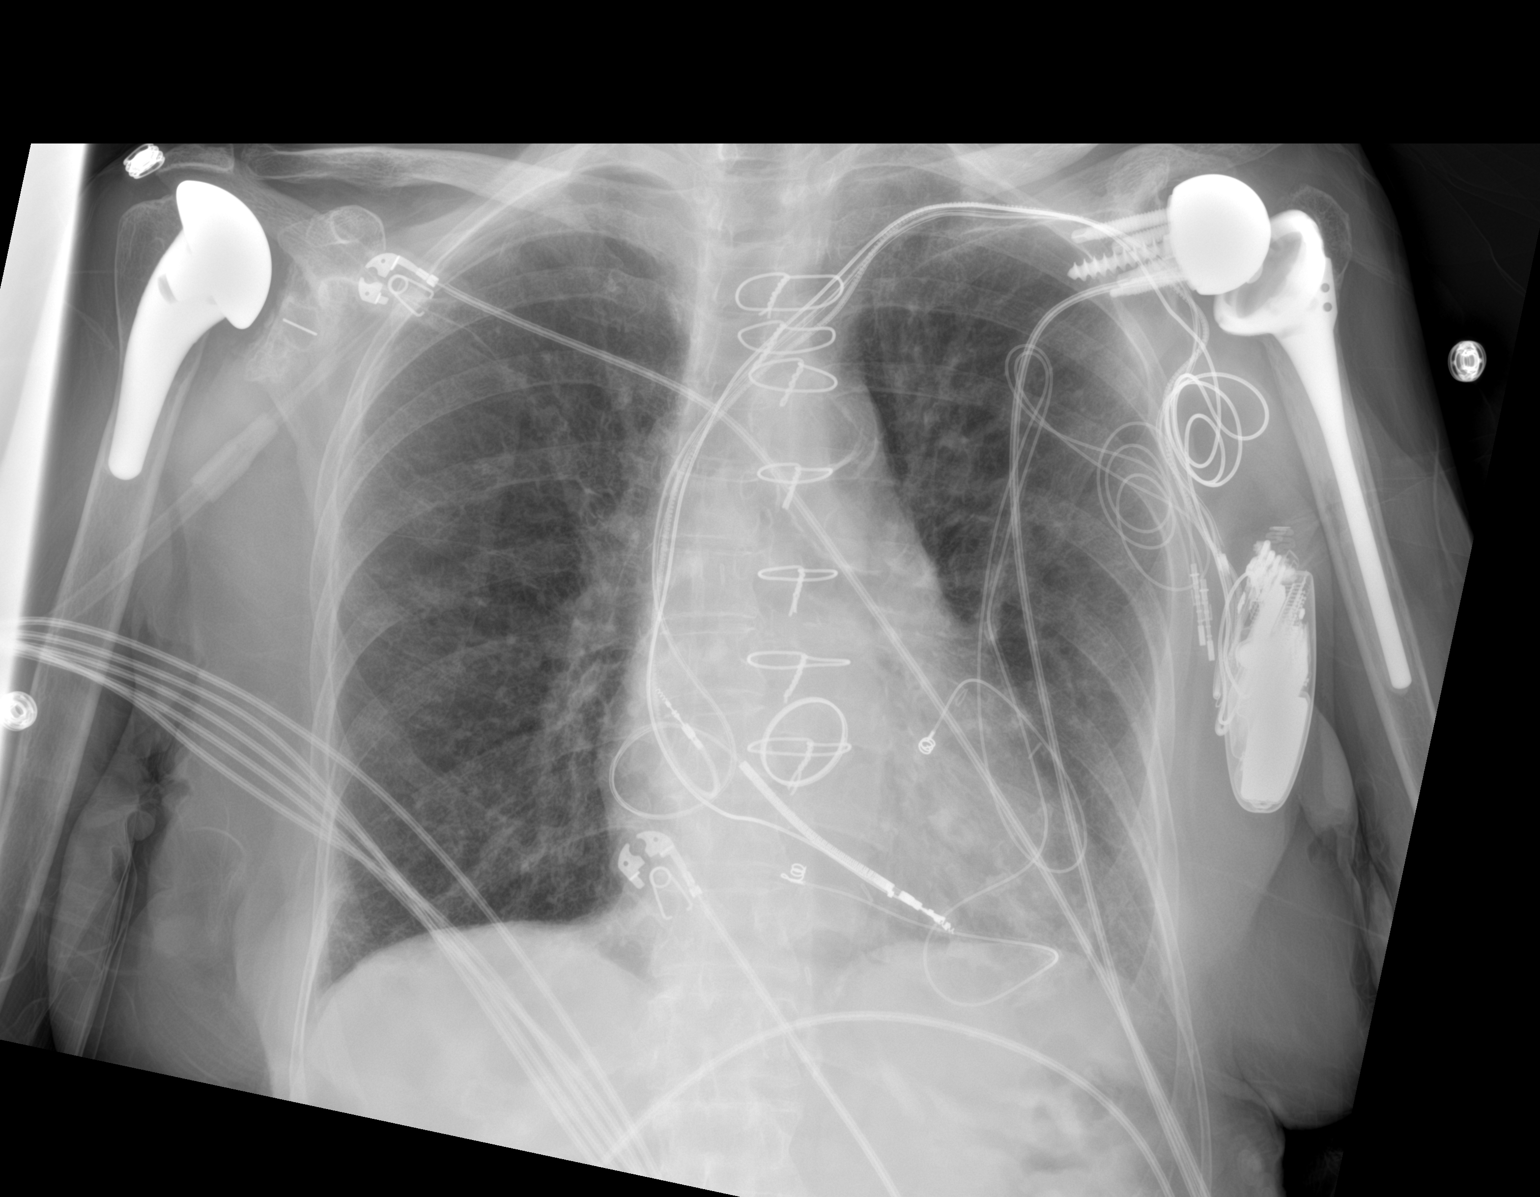

[1 of 1 positions shown; findings below may reference images not displayed]

FINDINGS: As before, there are left subclavian cardiac pacers with lead tip
over the right atrium and right ventricle. Epicardial pacing wires
noted. Again seen are sternotomy wires and cardiac valve
replacement. No cardiomegaly. Mediastinal contours are within normal
limits. No focal consolidation or pleural effusion. There is mild
central pulmonary vascular dilatation seen. Again seen is a right
shoulder prosthesis. There has been interval left shoulder
prosthesis with apparent minimal widening at the prosthetic
glenohumeral joint.
IMPRESSION: Mild central pulmonary vascular congestion. No pleural effusion or
consolidation. There has been interval left shoulder prosthesis with
apparent minimal widening at the prosthetic glenohumeral joint.
Clinical correlation is suggested.

## 2023-03-04 ENCOUNTER — Other Ambulatory Visit: Payer: Self-pay | Admitting: Physician Assistant

## 2023-03-05 NOTE — Progress Notes (Signed)
Remote ICD transmission.   

## 2023-03-16 ENCOUNTER — Ambulatory Visit: Payer: Medicare Other | Attending: Cardiology | Admitting: *Deleted

## 2023-03-16 DIAGNOSIS — Z952 Presence of prosthetic heart valve: Secondary | ICD-10-CM

## 2023-03-16 DIAGNOSIS — Z7901 Long term (current) use of anticoagulants: Secondary | ICD-10-CM | POA: Diagnosis not present

## 2023-03-16 LAB — POCT INR: INR: 3.7 — AB (ref 2.0–3.0)

## 2023-03-16 NOTE — Patient Instructions (Signed)
Description   TAKE 1/2 TABLET OF WARFARIN  TODAY THEN continue taking 1 tablet daily, except 1.5 tablets on Wednesdays and Saturdays. Recheck INR in 2 weeks.  Coumadin Clinic [phone].  Stay consistent with 1-2 helping of greens per week.

## 2023-03-30 ENCOUNTER — Ambulatory Visit: Payer: Medicare Other | Attending: Cardiovascular Disease

## 2023-03-30 DIAGNOSIS — Z7901 Long term (current) use of anticoagulants: Secondary | ICD-10-CM

## 2023-03-30 DIAGNOSIS — Z952 Presence of prosthetic heart valve: Secondary | ICD-10-CM

## 2023-03-30 LAB — POCT INR: INR: 4.1 — AB (ref 2.0–3.0)

## 2023-03-30 NOTE — Patient Instructions (Signed)
HOLD WARFARIN  TODAY THEN continue taking 1 tablet daily, except 1.5 tablets on Wednesdays and Saturdays. Recheck INR in 2 weeks.  Coumadin Clinic [phone].  Stay consistent with 1-2 helping of greens per week.

## 2023-04-13 ENCOUNTER — Ambulatory Visit: Payer: Medicare Other | Attending: Cardiology | Admitting: *Deleted

## 2023-04-13 DIAGNOSIS — Z952 Presence of prosthetic heart valve: Secondary | ICD-10-CM | POA: Diagnosis not present

## 2023-04-13 DIAGNOSIS — Z7901 Long term (current) use of anticoagulants: Secondary | ICD-10-CM

## 2023-04-13 LAB — POCT INR: INR: 4.1 — AB (ref 2.0–3.0)

## 2023-04-13 NOTE — Progress Notes (Deleted)
  Cardiology Office Note:  .   Date:  04/13/2023  ID:  [name], DOB Jan 18, 1941, MRN [id number] PCP: Garlan Fillers, MD  [facility] Providers Cardiologist:  Nanetta Batty, MD Electrophysiologist:  Lanier Prude, MD { Click to update primary MD,subspecialty MD or APP then REFRESH:1}   Patient Profile: .      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 Rennis Golden 08/30/20. She reported longstanding history of hypertriglyceridemia also in multiple family members. At the time of the referral, total cholesterol was 252, triglycerides 602, HDL 29, LDL 116 . She eats a very restricted diet and has a low BMI of 18. She was taking pravastatin 40 mg with a  started on Vascepa.   At last lipid clinic visit on 09/03/22 with Dr. Rennis Golden, she was doing well. She was not compliant with Vascepa but continued statin. Total cholesterol was 273, triglycerides 383, HDL 34, and LDL 165, up from 108 about a year prior. She was encouraged to take Vascepa 1 g twice daily and return in 6 months.        History of Present Illness: .   [name] is a *** 82 y.o. female ***   Discussed the use of AI scribe software for clinical note transcription with the patient, who gave verbal consent to proceed.   ROS: ***       Studies Reviewed: .        *** Risk Assessment/Calculations:   {Does this patient have ATRIAL FIBRILLATION?:[phone]} No BP recorded.  {Refresh Note OR Click here to enter BP  :1}***       Physical Exam:   VS:  There were no vitals taken for this visit.   Wt Readings from Last 3 Encounters:  02/10/23 96 lb 3.2 oz (43.6 kg)  11/20/22 89 lb (40.4 kg)  11/17/22 91 lb 12.8 oz (41.6 kg)    GEN: Well nourished, well developed in no  acute distress NECK: No JVD; No carotid bruits CARDIAC: ***RRR, no murmurs, rubs, gallops RESPIRATORY:  Clear to auscultation without rales, wheezing or rhonchi  ABDOMEN: Soft, non-tender, non-distended EXTREMITIES:  No edema; No deformity     ASSESSMENT AND PLAN: .    Dyslipidemia:    {Are you ordering a CV Procedure (e.g. stress test, cath, DCCV, TEE, etc)?   Press F2        :[id number]}  Dispo: ***  Signed, Eligha Bridegroom, NP-C

## 2023-04-13 NOTE — Patient Instructions (Addendum)
Description   DO NOT TAKE ANY WARFARIN TODAY THEN START taking 1 tablet daily, except 1.5 tablets on Wednesdays. Recheck INR in 2 weeks. Coumadin Clinic [phone].  Stay consistent with 1-2 helping of greens per week.

## 2023-04-15 ENCOUNTER — Encounter (HOSPITAL_BASED_OUTPATIENT_CLINIC_OR_DEPARTMENT_OTHER): Payer: Medicare Other | Admitting: Nurse Practitioner

## 2023-04-27 ENCOUNTER — Ambulatory Visit: Payer: Medicare Other | Attending: Cardiology | Admitting: *Deleted

## 2023-04-27 DIAGNOSIS — Z7901 Long term (current) use of anticoagulants: Secondary | ICD-10-CM | POA: Diagnosis not present

## 2023-04-27 DIAGNOSIS — Z952 Presence of prosthetic heart valve: Secondary | ICD-10-CM

## 2023-04-27 LAB — POCT INR: INR: 3.9 — AB (ref 2.0–3.0)

## 2023-04-27 NOTE — Patient Instructions (Signed)
Description   DO NOT TAKE ANY WARFARIN TODAY THEN START taking 1 tablet daily. Recheck INR in 2 weeks. Coumadin Clinic [phone].  Stay consistent with 1-2 helping of greens per week.

## 2023-04-28 LAB — LAB REPORT - SCANNED: EGFR: 39

## 2023-05-11 ENCOUNTER — Ambulatory Visit: Payer: Medicare Other | Attending: Cardiovascular Disease | Admitting: *Deleted

## 2023-05-11 DIAGNOSIS — Z952 Presence of prosthetic heart valve: Secondary | ICD-10-CM | POA: Diagnosis not present

## 2023-05-11 DIAGNOSIS — Z7901 Long term (current) use of anticoagulants: Secondary | ICD-10-CM

## 2023-05-11 LAB — POCT INR: INR: 3.2 — AB (ref 2.0–3.0)

## 2023-05-11 NOTE — Patient Instructions (Signed)
Description   Continue taking warfarin 1 tablet daily. Recheck INR in 2 weeks with MD appt. Coumadin Clinic [phone]. Stay consistent with 1-2 helping of greens per week.

## 2023-05-13 ENCOUNTER — Other Ambulatory Visit: Payer: Self-pay | Admitting: Internal Medicine

## 2023-05-13 DIAGNOSIS — R519 Headache, unspecified: Secondary | ICD-10-CM

## 2023-05-13 DIAGNOSIS — R110 Nausea: Secondary | ICD-10-CM

## 2023-05-19 ENCOUNTER — Ambulatory Visit (INDEPENDENT_AMBULATORY_CARE_PROVIDER_SITE_OTHER): Payer: Medicare Other

## 2023-05-19 DIAGNOSIS — I428 Other cardiomyopathies: Secondary | ICD-10-CM

## 2023-05-19 DIAGNOSIS — I5042 Chronic combined systolic (congestive) and diastolic (congestive) heart failure: Secondary | ICD-10-CM

## 2023-05-20 LAB — CUP PACEART REMOTE DEVICE CHECK
Battery Remaining Longevity: 30 mo
Battery Voltage: 2.94 V
Brady Statistic AP VP Percent: 49.73 %
Brady Statistic AP VS Percent: 2.99 %
Brady Statistic AS VP Percent: 46.03 %
Brady Statistic AS VS Percent: 1.25 %
Brady Statistic RA Percent Paced: 52.45 %
Brady Statistic RV Percent Paced: 7.31 %
Date Time Interrogation Session: 20241218022722
HighPow Impedance: 51 Ohm
Implantable Lead Connection Status: 753985
Implantable Lead Connection Status: 753985
Implantable Lead Connection Status: 753985
Implantable Lead Implant Date: 20080221
Implantable Lead Implant Date: 20080221
Implantable Lead Implant Date: 20080221
Implantable Lead Location: 753858
Implantable Lead Location: 753859
Implantable Lead Location: 753860
Implantable Lead Model: 4543
Implantable Lead Model: 5076
Implantable Lead Model: 7002
Implantable Lead Serial Number: 134379
Implantable Pulse Generator Implant Date: 20181128
Lead Channel Impedance Value: 266 Ohm
Lead Channel Impedance Value: 323 Ohm
Lead Channel Impedance Value: 380 Ohm
Lead Channel Impedance Value: 399 Ohm
Lead Channel Impedance Value: 437 Ohm
Lead Channel Impedance Value: 665 Ohm
Lead Channel Pacing Threshold Amplitude: 0.75 V
Lead Channel Pacing Threshold Amplitude: 0.75 V
Lead Channel Pacing Threshold Amplitude: 1.125 V
Lead Channel Pacing Threshold Pulse Width: 0.4 ms
Lead Channel Pacing Threshold Pulse Width: 0.4 ms
Lead Channel Pacing Threshold Pulse Width: 0.6 ms
Lead Channel Sensing Intrinsic Amplitude: 2.125 mV
Lead Channel Sensing Intrinsic Amplitude: 2.125 mV
Lead Channel Sensing Intrinsic Amplitude: 23.125 mV
Lead Channel Sensing Intrinsic Amplitude: 23.125 mV
Lead Channel Setting Pacing Amplitude: 1.25 V
Lead Channel Setting Pacing Amplitude: 1.5 V
Lead Channel Setting Pacing Amplitude: 2.25 V
Lead Channel Setting Pacing Pulse Width: 0.4 ms
Lead Channel Setting Pacing Pulse Width: 0.6 ms
Lead Channel Setting Sensing Sensitivity: 1.2 mV
Zone Setting Status: 755011

## 2023-05-25 ENCOUNTER — Encounter: Payer: Self-pay | Admitting: Cardiovascular Disease

## 2023-05-25 ENCOUNTER — Ambulatory Visit (INDEPENDENT_AMBULATORY_CARE_PROVIDER_SITE_OTHER): Payer: Medicare Other

## 2023-05-25 ENCOUNTER — Ambulatory Visit: Payer: Medicare Other | Attending: Cardiovascular Disease | Admitting: Cardiovascular Disease

## 2023-05-25 VITALS — BP 108/40 | HR 74 | Ht 62.0 in | Wt 92.0 lb

## 2023-05-25 DIAGNOSIS — Z95810 Presence of automatic (implantable) cardiac defibrillator: Secondary | ICD-10-CM | POA: Diagnosis not present

## 2023-05-25 DIAGNOSIS — I480 Paroxysmal atrial fibrillation: Secondary | ICD-10-CM | POA: Diagnosis not present

## 2023-05-25 DIAGNOSIS — I5043 Acute on chronic combined systolic (congestive) and diastolic (congestive) heart failure: Secondary | ICD-10-CM

## 2023-05-25 DIAGNOSIS — Z952 Presence of prosthetic heart valve: Secondary | ICD-10-CM

## 2023-05-25 DIAGNOSIS — Z7901 Long term (current) use of anticoagulants: Secondary | ICD-10-CM | POA: Diagnosis not present

## 2023-05-25 DIAGNOSIS — I10 Essential (primary) hypertension: Secondary | ICD-10-CM | POA: Diagnosis not present

## 2023-05-25 DIAGNOSIS — E782 Mixed hyperlipidemia: Secondary | ICD-10-CM

## 2023-05-25 LAB — POCT INR: INR: 3 (ref 2.0–3.0)

## 2023-05-25 NOTE — Assessment & Plan Note (Signed)
History of PAF maintaining sinus rhythm on Coumadin oral anticoagulation. ?

## 2023-05-25 NOTE — Progress Notes (Signed)
05/25/2023 [name]   1940-11-27  [id number]  Primary Physician Kathleen Fillers, MD Primary Cardiologist: Kathleen Gess MD [name], [facility], [place]  HPI:  [name] is a 82 y.o.  thin appearing, married, Caucasian female, mother of 4(2 deceased sons), grandmother to eight grandchildren, who I last saw in the office 11/20/2022.[name] Her husband [name] is also a patient of mine and accompanies her today.  He had stage IV lung cancer, which responded to chemotherapy. 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 Lisabeth [name] in 2002 with redo with a [employer] [name] MVR performed by Dr. Evelene [name] in 2007. She had normal coronary arteries. Her EF improved initially from 10% to normal with cardiac resynchronization therapy. Dr. Royann [name] follows this. She has undergone a generator change by Dr. Royann [name] on April 22, 2011, and she is followed by [name].  Recent 2-D echocardiogram performed 02/07/16 revealed normal ejection fraction with a normal functioning mitral mechanical prosthesis. She denies chest pain or shortness of breath.   She had a bi-V generator change by Dr. Royann [name] 04/28/17 after having a Lovenox bridge for her mitral valve. She is otherwise asymptomatic with her most recent 2-D echo performed 08/05/2017 revealing an EF of 50 to 55% with a well-functioning mitral mechanical prosthesis.   She was hospitalized for 12 days in May 2024 with heart failure.  Her 2D echo revealed a significant decline in LV function with an EF in the 20 to 25% range down from 50 to 55%.  Right left heart cath performed Dr. Tresa [name] 10/08/2022 revealed nonobstructive CAD.  Her BiV ICD was interrogated by Dr. Royann [name] and was found to be functioning properly.  Her mitral valve prosthesis was working normally as well.  She is on guideline directed optimal medical therapy.  She denies chest pain or shortness of breath.   Current Meds  Medication  Sig   acetaminophen (TYLENOL) 650 MG CR tablet Take 650 mg by mouth every 8 (eight) hours as needed for pain.   allopurinol (ZYLOPRIM) 300 MG tablet Take 300 mg by mouth every evening.    ALPRAZolam (XANAX) 0.25 MG tablet Take 0.25 mg by mouth at bedtime as needed for anxiety or sleep.    antiseptic oral rinse (BIOTENE) LIQD 15 mLs by Mouth Rinse route as needed for dry mouth.   buPROPion (WELLBUTRIN SR) 150 MG 12 hr tablet Take 150 mg by mouth in the morning.   esomeprazole (NEXIUM) 40 MG capsule TAKE 1 CAPSULE BY MOUTH EVERY DAY BEFORE BREAKFAST   ferrous sulfate 325 (65 FE) MG tablet Take 1 tablet (325 mg total) by mouth daily with breakfast.   furosemide (LASIX) 20 MG tablet Take 20 mg by mouth 2 (two) times daily.   furosemide (LASIX) 40 MG tablet Take 1 tablet (40 mg total) by mouth daily.   icosapent Ethyl (VASCEPA) 1 g capsule Take 1 capsule (1 g total) by mouth 2 (two) times daily.   ipratropium (ATROVENT) 0.06 % nasal spray Place 2 sprays into both nostrils 2 (two) times daily.   levothyroxine (SYNTHROID) 75 MCG tablet Take 75 mcg by mouth daily before breakfast.   memantine (NAMENDA) 5 MG tablet Take 5 mg by mouth every morning.   metoCLOPramide (REGLAN) 5 MG tablet Take 5 mg by mouth 2 (two) times daily as needed for nausea or vomiting.   metoprolol succinate (TOPROL-XL) 50 MG 24 hr tablet Take 0.5 tablets (25 mg total)  by mouth every morning AND 1 tablet (50 mg total) every evening. Take with or immediately following a meal..   Multiple Vitamin (MULTIVITAMIN WITH MINERALS) TABS tablet Take 1 tablet by mouth daily.   pravastatin (PRAVACHOL) 40 MG tablet Take 40 mg at bedtime by mouth.    PROAIR HFA 108 (90 BASE) MCG/ACT inhaler Inhale 1-2 puffs into the lungs every 6 (six) hours as needed for wheezing.   Sennosides (SENNA) 15 MG TABS Take 30 mg by mouth at bedtime.   spironolactone (ALDACTONE) 25 MG tablet Take 1 tablet (25 mg total) by mouth daily.   traMADol (ULTRAM) 50 MG tablet  Take 1 tablet (50 mg total) by mouth every 8 (eight) hours as needed for moderate pain or severe pain (1 tablet every 8 hours as needed for pain).   traZODone (DESYREL) 50 MG tablet Take 25 mg by mouth at bedtime as needed.   valsartan (DIOVAN) 160 MG tablet Take 1 tablet (160 mg total) by mouth daily.   VOLTAREN 1 % GEL Apply 2-4 g topically in the morning and at bedtime.   warfarin (COUMADIN) 2.5 MG tablet TAKE 1 TO 2 TABLETS BY MOUTH DAILY AS DIRECTED BY THE COUMADIN CLINIC   Current Facility-Administered Medications for the 05/25/23 encounter (Office Visit) with Kathleen Gess, MD  Medication   0.9 %  sodium chloride infusion     Allergies  Allergen Reactions   Ace Inhibitors Cough   Lipitor [Atorvastatin Calcium] Other (See Comments)    Knee pain    Megace [Megestrol] Other (See Comments)    High blood pressure and blurred vision   Aspirin Other (See Comments)    Nephrologist said to not take this Update 10/07/22 - patient clarifies this recommendation was lumped in with a whole bunch of other medications at that time many years ago when nephrologist told her to stay away from Tylenol, vitamins, ASA. Denies prior allergy or allergic reaction to this   Clorazepate Dipotassium Other (See Comments)    Interacts with a drug being taken   Simvastatin Other (See Comments)    Muscle pain   Tiotropium Bromide Monohydrate Other (See Comments)    Dry mouth   Codeine Nausea And Vomiting   Tape Rash    Social History   Socioeconomic History   Marital status: Married    Spouse name: [name]   Number of children: 4   Years of education: 12   Highest education level: Not on file  Occupational History   Occupation: retired  Tobacco Use   Smoking status: Former    Current packs/day: 0.00    Types: Cigarettes    Quit date: 06/02/2001    Years since quitting: 21.9   Smokeless tobacco: Never  Vaping Use   Vaping status: Never Used  Substance and Sexual Activity   Alcohol use: Yes     Alcohol/week: 0.0 standard drinks of alcohol    Comment: social-1 every 6 months   Drug use: No   Sexual activity: Not on file  Other Topics Concern   Not on file  Social History Narrative   Drinks very little caffeine    Social Drivers of Health   Financial Resource Strain: Low Risk  (10/09/2022)   Overall Financial Resource Strain (CARDIA)    Difficulty of Paying Living Expenses: Not hard at all  Food Insecurity: No Food Insecurity (10/03/2022)   Hunger Vital Sign    Worried About Running Out of Food in the Last Year: Never true  Ran Out of Food in the Last Year: Never true  Transportation Needs: No Transportation Needs (10/03/2022)   PRAPARE - Administrator, Civil Service (Medical): No    Lack of Transportation (Non-Medical): No  Physical Activity: Not on file  Stress: Not on file  Social Connections: Not on file  Intimate Partner Violence: Not At Risk (10/03/2022)   Humiliation, Afraid, Rape, and Kick questionnaire    Fear of Current or Ex-Partner: No    Emotionally Abused: No    Physically Abused: No    Sexually Abused: No     Review of Systems: General: negative for chills, fever, night sweats or weight changes.  Cardiovascular: negative for chest pain, dyspnea on exertion, edema, orthopnea, palpitations, paroxysmal nocturnal dyspnea or shortness of breath Dermatological: negative for rash Respiratory: negative for cough or wheezing Urologic: negative for hematuria Abdominal: negative for nausea, vomiting, diarrhea, bright red blood per rectum, melena, or hematemesis Neurologic: negative for visual changes, syncope, or dizziness All other systems reviewed and are otherwise negative except as noted above.    Blood pressure (!) 108/40, pulse 74, height 5\' 2"  (1.575 m), weight 92 lb (41.7 kg), SpO2 94%.  General appearance: alert and no distress Neck: no adenopathy, no carotid bruit, no JVD, supple, symmetrical, trachea midline, and thyroid not enlarged,  symmetric, no tenderness/mass/nodules Lungs: clear to auscultation bilaterally Heart: Crisp prosthetic valve sounds Extremities: extremities normal, atraumatic, no cyanosis or edema Pulses: 2+ and symmetric Skin: Skin color, texture, turgor normal. No rashes or lesions Neurologic: Grossly normal  EKG not performed today      ASSESSMENT AND PLAN:   History of mitral valve replacement with mechanical valve History of mitral valve repair by Dr. Lisabeth [name] in 2002 with redo [facility] [name] mitral valve replacement by Dr. Rexanne Mano in 2007.  Her most recent 2D echocardiogram performed 02/20/2023 revealed a well-functioning aortic mechanical prosthesis.  She is on Coumadin oral anticoagulation.  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 Royann [name].  Acute on chronic combined systolic and diastolic CHF (congestive heart failure) (HCC) History of nonischemic cardiomyopathy with an EF initially of 10% which improved with resynchronization therapy.  Her most recent EF however has fallen down to 25 to 30% on GDMT.  She is asymptomatic.  She did undergo right left heart cath by Dr. Tresa [name] 10/08/2022 revealing no evidence of CAD with a fairly normal LVEDP and a mildly elevated wedge.  Paroxysmal atrial fibrillation (HCC) History of PAF maintaining sinus rhythm on Coumadin oral anticoagulation.  Mixed hyperlipidemia History of hyperlipidemia on Vascepa with lipid profile performed 03/31/2023 revealing total cholesterol 243, LDL 164 and HDL 30.  LDL goal less than 70 given no significant CAD.  She is being followed by the lipid clinic.  Hypertension History of essential hypertension her blood pressure measured today at 108/40.  She is on valsartan and metoprolol.     Kathleen Gess MD FACP,FACC,FAHA, [facility] 05/25/2023 1:46 PM

## 2023-05-25 NOTE — Assessment & Plan Note (Signed)
History of BiV ICD implantation (CRT) followed by Dr. Royann Shivers.

## 2023-05-25 NOTE — Assessment & Plan Note (Signed)
History of hyperlipidemia on Vascepa with lipid profile performed 03/31/2023 revealing total cholesterol 243, LDL 164 and HDL 30.  LDL goal less than 70 given no significant CAD.  She is being followed by the lipid clinic.

## 2023-05-25 NOTE — Patient Instructions (Signed)
Continue taking warfarin 1 tablet daily. Recheck INR in 4 weeks. Coumadin Clinic [phone]. Stay consistent with 1-2 helping of greens per week.

## 2023-05-25 NOTE — Patient Instructions (Signed)
Medication Instructions:  Your physician recommends that you continue on your current medications as directed. Please refer to the Current Medication list given to you today.  *If you need a refill on your cardiac medications before your next appointment, please call your pharmacy*   Testing/Procedures: Your physician has requested that you have an echocardiogram. Echocardiography is a painless test that uses sound waves to create images of your heart. It provides your doctor with information about the size and shape of your heart and how well your heart's chambers and valves are working. This procedure takes approximately one hour. There are no restrictions for this procedure. Please do NOT wear cologne, perfume, aftershave, or lotions (deodorant is allowed). Please arrive 15 minutes prior to your appointment time.  **To do in September 2025**  Please note: We ask at that you not bring children with you during ultrasound (echo/ vascular) testing. Due to room size and safety concerns, children are not allowed in the ultrasound rooms during exams. Our front office staff cannot provide observation of children in our lobby area while testing is being conducted. An adult accompanying a patient to their appointment will only be allowed in the ultrasound room at the discretion of the ultrasound technician under special circumstances. We apologize for any inconvenience.    Follow-Up: At [facility], you and your health needs are our priority.  As part of our continuing mission to provide you with exceptional heart care, we have created designated Provider Care Teams.  These Care Teams include your primary Cardiologist (physician) and Advanced Practice Providers (APPs -  Physician Assistants and Nurse Practitioners) who all work together to provide you with the care you need, when you need it.  We recommend signing up for the patient portal called "MyChart".  Sign up information is provided on  this After Visit Summary.  MyChart is used to connect with patients for Virtual Visits (Telemedicine).  Patients are able to view lab/test results, encounter notes, upcoming appointments, etc.  Non-urgent messages can be sent to your provider as well.   To learn more about what you can do with MyChart, go to [email or web address].    Your next appointment:   12 month(s)  Provider:   Nanetta Batty, MD

## 2023-05-25 NOTE — Assessment & Plan Note (Signed)
History of mitral valve repair by Dr. Lisabeth Register in 2002 with redo [facility] mitral valve replacement by Dr. Rexanne Mano in 2007.  Her most recent 2D echocardiogram performed 02/20/2023 revealed a well-functioning aortic mechanical prosthesis.  She is on Coumadin oral anticoagulation.

## 2023-05-25 NOTE — Assessment & Plan Note (Signed)
History of nonischemic cardiomyopathy with an EF initially of 10% which improved with resynchronization therapy.  Her most recent EF however has fallen down to 25 to 30% on GDMT.  She is asymptomatic.  She did undergo right left heart cath by Dr. Tresa Endo 10/08/2022 revealing no evidence of CAD with a fairly normal LVEDP and a mildly elevated wedge.

## 2023-05-25 NOTE — Assessment & Plan Note (Signed)
History of essential hypertension her blood pressure measured today at 108/40.  She is on valsartan and metoprolol.

## 2023-05-29 ENCOUNTER — Telehealth: Payer: Self-pay | Admitting: Cardiovascular Disease

## 2023-05-29 ENCOUNTER — Ambulatory Visit
Admission: RE | Admit: 2023-05-29 | Discharge: 2023-05-29 | Disposition: A | Discharge status: Home / Self Care | Payer: Medicare Other | Source: Ambulatory Visit | Attending: Internal Medicine | Admitting: Internal Medicine

## 2023-05-29 DIAGNOSIS — R110 Nausea: Secondary | ICD-10-CM

## 2023-05-29 DIAGNOSIS — R519 Headache, unspecified: Secondary | ICD-10-CM

## 2023-05-29 NOTE — Telephone Encounter (Signed)
Runell Gess, MD  You1 hour ago (1:21 PM)    She can take 40 mg once  a day

## 2023-05-29 NOTE — Telephone Encounter (Signed)
Pt c/o medication issue:  1. Name of Medication: furosemide (LASIX) 20 MG tablet   2. How are you currently taking this medication (dosage and times per day)?   3. Are you having a reaction (difficulty breathing--STAT)?   4. What is your medication issue? Patient is requesting call back to get clarification on how she is taking this medication and to make sure her pharmacy has the correct prescription for her to continue to take.

## 2023-05-29 NOTE — Telephone Encounter (Signed)
Patient identification verified by 2 forms. Marilynn Rail, RN    Called and spoke to patient  Informed patient per Dr. Allyson Sabal okay to take 40mg  daily of lasix  Patient verbalized understanding, no questions at this time

## 2023-06-01 NOTE — Progress Notes (Signed)
 Cardiology Office Note:  .   Date:  06/11/2023  ID:  [name], DOB [birth year], MRN [id number] PCP: Yolande Toribio MATSU, MD  [facility] Providers Cardiologist:  Dorn Lesches, MD Electrophysiologist:  OLE ONEIDA HOLTS, MD    Patient Profile: .      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 Florette at [facility] in 2002 with redo MVR by Dr. 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 Mona 08/30/2020.  She reported a longstanding history of high triglycerides present in multiple family members.  Had been on pravastatin  40 mg daily but more recently was referred for increase in triglycerides.  At that time most recent lipid panel revealed total cholesterol 252, triglycerides 602, HDL 29, and LDL 116.  She was at a weight of 99 pounds with a BMI of 18.74 and eating a very restricted diet. Felt to be highly suspicious for familial hypertriglyceridemia. She was started on Vascepa  with improvement in triglycerides from 602 to 363.   Last lipid clinic visit was 09/03/2022 at which time she reported noncompliance with Vascepa  due to difficulty taking multiple capsules.  She remained on pravastatin  40 mg daily with LDL increased from 108-165.  She continued to struggle with weight gain with a BMI of 17.  She was encouraged to try Vascepa  1 g twice daily and follow-up in 6 months with lipid panel prior.       History of Present Illness: .   [name] is a pleasant 82 y.o. female who is here today for follow-up of dyslipidemia and is accompanied by her husband. She reports occasional chest pain, but denies shortness of breath. Her LDL is elevated at 164 and triglycerides elevated at 257. She notes that her  triglycerides have been as high as 600 in the past. Despite these high levels, she does not wish to pursue aggressive treatment, and is content with her current regimen of pravastatin  and Vascepa . She follows a generally heart-healthy diet, with limited red meat and fat, no Etoh consumption, but does not restrict carbohydrates or occasional sweets due to low weight. Intake of green vegetables limited by chronic coumadin  therapy. She reports that she would not likely undergo invasive cardiac procedures in the future and is thankful to have outlived her siblings. She reports occasional chest pain that has not worsened in the last few weeks.  She denies shortness of breath, orthopnea, PND, edema, palpitations, presyncope, syncope.  Discussed the use of AI scribe software for clinical note transcription with the patient, who gave verbal consent to proceed.   ROS: See HPI       Studies Reviewed: .         Risk Assessment/Calculations:             Physical Exam:   VS:  BP (!) 122/50   Pulse 74   Ht 5' 2 (1.575 m)   Wt 93 lb 9.6 oz (42.5 kg)   SpO2 93%   BMI 17.12 kg/m    Wt Readings from Last 3 Encounters:  06/10/23 93 lb 9.6 oz (42.5 kg)  05/25/23 92 lb (41.7 kg)  02/10/23 96 lb 3.2 oz (43.6 kg)    GEN: Thin, frail elderly in no acute distress NECK: No JVD; No carotid bruits CARDIAC: RRR, no  murmurs, rubs, gallops RESPIRATORY:  Clear to auscultation without rales, wheezing or rhonchi  ABDOMEN: Soft, non-tender, non-distended EXTREMITIES:  No edema; No deformity     ASSESSMENT AND PLAN: .    Dyslipidemia LDL goal < 70/Hypertriglyceridemia: Most recent lipid panel 03/31/23 with total cholesterol 243, HDL 30, LDL 164, and triglycerides 257. Long history of elevated LDL and triglycerides despite adherence to a mostly heart-healthy diet and medication compliance. Triglycerides previously reaching > 600. She has been intolerant of statins other than pravastatin  40mg . She currently takes  Vascepa  1g twice daily. Lengthy discussion about s/s of pancreatitis and to continue to limit saturated fat. Encouraged her to choose complex over simple carbohydrate foods to avoid sugar. Encouraged her to try to increase Vascepa  aiming for 4g daily, with possibly adding a dose at lunchtime.She does not want to consider any additional medication changes. She regularly sees primary cardiologist, Dr. Court and PCP, Dr. Yolande. Will let us  know if lipids are not rechecked in 6 months at which time we will order.   CAD: She has occasional chest pain that she feels is stable. Nonobstructive CAD on cath 10/2022. States that she would not likely undergo another invasive procedure. No indication for further ischemic evaluation at this time. Not on asa in the setting of chronic OAC therapy for mitral valve replacement.         Dispo: 1 year with Dr. Mona or me  Signed, Rosaline Bane, NP-C

## 2023-06-10 ENCOUNTER — Encounter (HOSPITAL_BASED_OUTPATIENT_CLINIC_OR_DEPARTMENT_OTHER): Payer: Self-pay | Admitting: Nurse Practitioner

## 2023-06-10 ENCOUNTER — Ambulatory Visit (INDEPENDENT_AMBULATORY_CARE_PROVIDER_SITE_OTHER): Payer: Medicare Other | Admitting: Nurse Practitioner

## 2023-06-10 VITALS — BP 122/50 | HR 74 | Ht 62.0 in | Wt 93.6 lb

## 2023-06-10 DIAGNOSIS — E781 Pure hyperglyceridemia: Secondary | ICD-10-CM

## 2023-06-10 DIAGNOSIS — I2510 Atherosclerotic heart disease of native coronary artery without angina pectoris: Secondary | ICD-10-CM | POA: Diagnosis not present

## 2023-06-10 DIAGNOSIS — E785 Hyperlipidemia, unspecified: Secondary | ICD-10-CM | POA: Diagnosis not present

## 2023-06-10 DIAGNOSIS — Z7901 Long term (current) use of anticoagulants: Secondary | ICD-10-CM | POA: Diagnosis not present

## 2023-06-10 DIAGNOSIS — Z789 Other specified health status: Secondary | ICD-10-CM

## 2023-06-10 NOTE — Patient Instructions (Addendum)
 Medication Instructions:   Your physician recommends that you continue on your current medications as directed. Please refer to the Current Medication list given to you today.    *If you need a refill on your cardiac medications before your next appointment, please call your pharmacy*   Lab Work:  None ordered.  If you have labs (blood work) drawn today and your tests are completely normal, you will receive your results only by: MyChart Message (if you have MyChart) OR A paper copy in the mail If you have any lab test that is abnormal or we need to change your treatment, we will call you to review the results.   Testing/Procedures:  None ordered.   Follow-Up: At [facility], you and your health needs are our priority.  As part of our continuing mission to provide you with exceptional heart care, we have created designated Provider Care Teams.  These Care Teams include your primary Cardiologist (physician) and Advanced Practice Providers (APPs -  Physician Assistants and Nurse Practitioners) who all work together to provide you with the care you need, when you need it.  We recommend signing up for the patient portal called MyChart.  Sign up information is provided on this After Visit Summary.  MyChart is used to connect with patients for Virtual Visits (Telemedicine).  Patients are able to view lab/test results, encounter notes, upcoming appointments, etc.  Non-urgent messages can be sent to your provider as well.   To learn more about what you can do with MyChart, go to [email or web address].    Your next appointment:   As needed   Other Instructions  Please take extra Vascepa  as tolerated. Goal is two (2) tablets ( 2 g ) by mouth twice daily.

## 2023-06-11 ENCOUNTER — Encounter (HOSPITAL_BASED_OUTPATIENT_CLINIC_OR_DEPARTMENT_OTHER): Payer: Self-pay | Admitting: Nurse Practitioner

## 2023-06-22 ENCOUNTER — Ambulatory Visit: Payer: Medicare Other | Attending: Cardiovascular Disease

## 2023-06-22 DIAGNOSIS — Z7901 Long term (current) use of anticoagulants: Secondary | ICD-10-CM | POA: Diagnosis not present

## 2023-06-22 DIAGNOSIS — Z952 Presence of prosthetic heart valve: Secondary | ICD-10-CM

## 2023-06-22 LAB — POCT INR: INR: 3.4 — AB (ref 2.0–3.0)

## 2023-06-22 NOTE — Patient Instructions (Signed)
 Continue taking warfarin 1 tablet daily. Recheck INR in 4 weeks. Coumadin Clinic [phone]. Stay consistent with 1-2 helping of greens per week.

## 2023-06-26 NOTE — Progress Notes (Signed)
Remote ICD transmission.

## 2023-06-26 NOTE — Addendum Note (Signed)
Addended by: Geralyn Flash D on: 06/26/2023 02:02 PM   Modules accepted: Orders

## 2023-07-20 ENCOUNTER — Ambulatory Visit: Payer: Medicare Other | Attending: Cardiovascular Disease

## 2023-07-20 DIAGNOSIS — Z7901 Long term (current) use of anticoagulants: Secondary | ICD-10-CM

## 2023-07-20 DIAGNOSIS — Z952 Presence of prosthetic heart valve: Secondary | ICD-10-CM

## 2023-07-20 LAB — POCT INR: INR: 5.7 — AB (ref 2.0–3.0)

## 2023-07-20 NOTE — Patient Instructions (Signed)
Hold today, Tuesday and Wednesday then Continue taking warfarin 1 tablet daily. Recheck INR in 1 week. Coumadin Clinic [phone]. Stay consistent with 1-2 helping of greens per week. Doxycycline 2/12-2/22;  Please call if you start ANY new medication.

## 2023-07-28 ENCOUNTER — Ambulatory Visit: Payer: Medicare Other | Attending: Cardiology | Admitting: *Deleted

## 2023-07-28 DIAGNOSIS — Z7901 Long term (current) use of anticoagulants: Secondary | ICD-10-CM

## 2023-07-28 DIAGNOSIS — Z952 Presence of prosthetic heart valve: Secondary | ICD-10-CM

## 2023-07-28 LAB — POCT INR: INR: 2.2 (ref 2.0–3.0)

## 2023-07-28 LAB — LAB REPORT - SCANNED: EGFR: 33

## 2023-07-28 NOTE — Patient Instructions (Addendum)
 Description   Today take 1.5 tablets of warfarin then continue taking warfarin 1 tablet daily. Recheck INR in 2 weeks. Coumadin Clinic [phone]. Stay consistent with 1-2 helping of greens per week. Please call if you start ANY new medication.

## 2023-08-11 ENCOUNTER — Ambulatory Visit: Payer: Medicare Other | Attending: Cardiovascular Disease

## 2023-08-11 DIAGNOSIS — Z7901 Long term (current) use of anticoagulants: Secondary | ICD-10-CM | POA: Diagnosis not present

## 2023-08-11 DIAGNOSIS — Z952 Presence of prosthetic heart valve: Secondary | ICD-10-CM

## 2023-08-11 LAB — POCT INR: INR: 3.9 — AB (ref 2.0–3.0)

## 2023-08-11 NOTE — Patient Instructions (Signed)
 continue taking warfarin 1 tablet daily.  Eat greens tonight.   Recheck INR in 3 weeks. Coumadin Clinic [phone]. Stay consistent with 1-2 helping of greens per week. Please call if you start ANY new medication.

## 2023-08-18 ENCOUNTER — Ambulatory Visit (INDEPENDENT_AMBULATORY_CARE_PROVIDER_SITE_OTHER): Payer: Medicare Other

## 2023-08-18 DIAGNOSIS — I428 Other cardiomyopathies: Secondary | ICD-10-CM

## 2023-08-19 LAB — CUP PACEART REMOTE DEVICE CHECK
Battery Remaining Longevity: 29 mo
Battery Voltage: 2.94 V
Brady Statistic AP VP Percent: 30.53 %
Brady Statistic AP VS Percent: 2.56 %
Brady Statistic AS VP Percent: 65.11 %
Brady Statistic AS VS Percent: 1.8 %
Brady Statistic RA Percent Paced: 32.76 %
Brady Statistic RV Percent Paced: 1.21 %
Date Time Interrogation Session: 20250318001802
HighPow Impedance: 53 Ohm
Implantable Lead Connection Status: 753985
Implantable Lead Connection Status: 753985
Implantable Lead Connection Status: 753985
Implantable Lead Implant Date: 20080221
Implantable Lead Implant Date: 20080221
Implantable Lead Implant Date: 20080221
Implantable Lead Location: 753858
Implantable Lead Location: 753859
Implantable Lead Location: 753860
Implantable Lead Model: 4543
Implantable Lead Model: 5076
Implantable Lead Model: 7002
Implantable Lead Serial Number: 134379
Implantable Pulse Generator Implant Date: 20181128
Lead Channel Impedance Value: 323 Ohm
Lead Channel Impedance Value: 342 Ohm
Lead Channel Impedance Value: 380 Ohm
Lead Channel Impedance Value: 437 Ohm
Lead Channel Impedance Value: 456 Ohm
Lead Channel Impedance Value: 703 Ohm
Lead Channel Pacing Threshold Amplitude: 0.75 V
Lead Channel Pacing Threshold Amplitude: 0.75 V
Lead Channel Pacing Threshold Amplitude: 1.125 V
Lead Channel Pacing Threshold Pulse Width: 0.4 ms
Lead Channel Pacing Threshold Pulse Width: 0.4 ms
Lead Channel Pacing Threshold Pulse Width: 0.6 ms
Lead Channel Sensing Intrinsic Amplitude: 1.875 mV
Lead Channel Sensing Intrinsic Amplitude: 1.875 mV
Lead Channel Sensing Intrinsic Amplitude: 31.625 mV
Lead Channel Sensing Intrinsic Amplitude: 31.625 mV
Lead Channel Setting Pacing Amplitude: 1.25 V
Lead Channel Setting Pacing Amplitude: 1.5 V
Lead Channel Setting Pacing Amplitude: 2.25 V
Lead Channel Setting Pacing Pulse Width: 0.4 ms
Lead Channel Setting Pacing Pulse Width: 0.6 ms
Lead Channel Setting Sensing Sensitivity: 1.2 mV
Zone Setting Status: 755011

## 2023-09-01 ENCOUNTER — Ambulatory Visit: Attending: Cardiology | Admitting: *Deleted

## 2023-09-01 DIAGNOSIS — Z7901 Long term (current) use of anticoagulants: Secondary | ICD-10-CM | POA: Diagnosis not present

## 2023-09-01 DIAGNOSIS — Z952 Presence of prosthetic heart valve: Secondary | ICD-10-CM

## 2023-09-01 LAB — POCT INR: INR: 4.5 — AB (ref 2.0–3.0)

## 2023-09-01 NOTE — Patient Instructions (Addendum)
 Description   Do not take any warfarin today then START taking warfarin 1 tablet daily except 1/2 tablet on Sundays. Eat greens tonight. Recheck INR in 2 weeks. Coumadin Clinic [phone]. Stay consistent with 1-2 helping of greens per week. Please call if you start ANY new medication.

## 2023-09-15 ENCOUNTER — Ambulatory Visit: Attending: Cardiology

## 2023-09-15 DIAGNOSIS — Z7901 Long term (current) use of anticoagulants: Secondary | ICD-10-CM

## 2023-09-15 DIAGNOSIS — Z952 Presence of prosthetic heart valve: Secondary | ICD-10-CM

## 2023-09-15 LAB — POCT INR: INR: 5.5 — AB (ref 2.0–3.0)

## 2023-09-15 NOTE — Patient Instructions (Signed)
 Do not take any warfarin today or tomorrow then continue taking warfarin 1 tablet daily except 1/2 tablet on Sundays.  Recheck INR in 2 weeks. Coumadin Clinic [phone]. Stay consistent with 1-2 helping of greens per week. Please call if you start ANY new medication.

## 2023-09-29 ENCOUNTER — Ambulatory Visit: Attending: Internal Medicine

## 2023-09-29 DIAGNOSIS — Z7901 Long term (current) use of anticoagulants: Secondary | ICD-10-CM

## 2023-09-29 DIAGNOSIS — Z952 Presence of prosthetic heart valve: Secondary | ICD-10-CM

## 2023-09-29 LAB — POCT INR: INR: 4.1 — AB (ref 2.0–3.0)

## 2023-09-29 NOTE — Patient Instructions (Signed)
 Do not take any warfarin today then Decrease to 1 tablet daily except 1/2 tablet on Sundays and Fridays.  Drink Boost/Ensure weekly.  Recheck INR in 2 weeks. Coumadin  Clinic [phone]. Stay consistent with 1-2 helping of greens per week. Please call if you start ANY new medication.

## 2023-09-30 ENCOUNTER — Telehealth: Payer: Self-pay | Admitting: Cardiovascular Disease

## 2023-09-30 NOTE — Telephone Encounter (Signed)
 Returned call and spoke with pt's husband. [name] states pt was seen by pulmonologist today and was instructed by Dr Matilde Son to ask if pt can hold Warfarin for 3 days since INR was surpratherapeutic yesterday, 09/29/23. I made him aware our anticoagulation had instructed pt to hold only 1 day, to bring INR down to goal of 2.5-3.5.[name] states pt is not currently experience any signs or symptoms of bleeding. I did make him aware if pt holds Warfarin for 3 days, her INR is likely to drop below 2.5 which put her at rick for blood clot or stroke. I advised for pt to follow Warfarin dosing instructions that was provided to her yesterday, unless she is experiencing bleeding symptoms, then she should seek immediate medical attention [name] verbalized understanding.

## 2023-09-30 NOTE — Telephone Encounter (Signed)
 Husband [name]) requests a call back regarding patient's Coumadin  check.

## 2023-10-05 NOTE — Addendum Note (Signed)
 Addended by: Lott Rouleau A on: 10/05/2023 11:17 AM   Modules accepted: Orders

## 2023-10-05 NOTE — Progress Notes (Signed)
 Remote ICD transmission.

## 2023-10-07 ENCOUNTER — Other Ambulatory Visit (HOSPITAL_COMMUNITY): Payer: Self-pay | Admitting: Family Medicine

## 2023-10-07 DIAGNOSIS — J90 Pleural effusion, not elsewhere classified: Secondary | ICD-10-CM

## 2023-10-07 NOTE — Telephone Encounter (Signed)
 Patient is following up. She says they inquired about holding Warfarin because she is having fluid removed from her lungs with [facility]. She is now requesting to hold it until Monday. Please advise.

## 2023-10-07 NOTE — Telephone Encounter (Signed)
 I apologize for the incorrect fax #. The phone number for Dr Carlyle Childes office is [phone].

## 2023-10-07 NOTE — Telephone Encounter (Signed)
 Received a call from pt stating she is scheduled for a procedure Monday, 10/12/23 to remove fluid from her lungs and has been instructed to hold her Warfarin 5 days prior (start hold today).   I called Dr Carlyle Childes office and spoke with [place]. I made her aware for pt's on Warfarin, a cardiac clearance must be completed prior to procedure. Germain Kohler states Dr Matilde Son instructed pt to only hold Warfarin 3 days; however, she will send a cardiac clearance request to our office as soon as possible. I provided her with fax to pre-op (#[phone]).  Will forward message to the pre-op team.

## 2023-10-07 NOTE — Telephone Encounter (Signed)
 I called Dr. Carlyle Childes office and provided correct for preop request to be sent too. [phone]

## 2023-10-07 NOTE — Telephone Encounter (Signed)
 Spoke with patient, she wants to know if she can hold her warfarin for 3 days per Dr Matilde Son. I instructed patient that holding warfarin that long would drop her INR too low, she was instructed to check how long would be safe to hold it again per Dr Matilde Son. Will forward to coag clinic for any further recommendation.

## 2023-10-07 NOTE — Telephone Encounter (Signed)
 Our preop team received these notes from our anticoag clinic. Fax # [phone] was given to the requesting office however this is in correct, that fax line no line no longer is active. The correct fax # is [phone].   I will send a message to coumadin  nurse Abigail to ask if she has ph# for DR. Sood's office

## 2023-10-07 NOTE — Telephone Encounter (Signed)
 Anticoagulation nurse provided # for Dr. Matilde Son. [phone] here is the number I called

## 2023-10-08 ENCOUNTER — Other Ambulatory Visit (HOSPITAL_COMMUNITY): Payer: Self-pay | Admitting: Cardiology

## 2023-10-08 NOTE — Telephone Encounter (Signed)
 Husband called to see if we have heard anything from Dr. Carlyle Childes office regarding the pt's upcoming procedure. Advised we have not heard anything and are awaiting a fax regarding this. He states they are supposed to call him back today and will let them know that [facility] needs this soon as possible.

## 2023-10-09 ENCOUNTER — Telehealth: Payer: Self-pay

## 2023-10-09 ENCOUNTER — Encounter: Payer: Self-pay | Admitting: Cardiovascular Disease

## 2023-10-09 ENCOUNTER — Ambulatory Visit: Attending: Student | Admitting: Student

## 2023-10-09 ENCOUNTER — Telehealth: Payer: Self-pay | Admitting: *Deleted

## 2023-10-09 DIAGNOSIS — Z01810 Encounter for preprocedural cardiovascular examination: Secondary | ICD-10-CM | POA: Diagnosis not present

## 2023-10-09 NOTE — Telephone Encounter (Signed)
 Left message for the pt to call back ASAP as she will need to be added to preop sched preop APP [place]. NP due to procedure date, med hold and date request received.

## 2023-10-09 NOTE — Telephone Encounter (Signed)
 Pt has been added on to preop APP schedule today, see previous notes from today. NP aware I did not review meds with pt but did get consent for tele.

## 2023-10-09 NOTE — Progress Notes (Signed)
 Virtual Visit via Telephone Note   Because of [name] co-morbid illnesses, she is at least at moderate risk for complications without adequate follow up.  This format is felt to be most appropriate for this patient at this time.  The patient did not have access to video technology/had technical difficulties with video requiring transitioning to audio format only (telephone).  All issues noted in this document were discussed and addressed.  No physical exam could be performed with this format.  Please refer to the patient's chart for her consent to telehealth for [facility].  Evaluation Performed:  Preoperative cardiovascular risk assessment _____________   Date:  10/09/2023   Patient ID:  [name], DOB 04-Dec-1940, MRN [id number] Patient Location:  Home Provider location:   Office  Primary Care Provider:  Bertha Broad, MD [facility] Providers Cardiologist:  Lauro Portal, MD Electrophysiologist:  Boyce Byes, MD {  Chief Complaint / Patient Profile   83 y.o. 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 Matilde Son and presents today for telephonic preoperative cardiovascular risk assessment.  History of Present Illness    [name] is a 83 y.o. female who presents via audio/video conferencing for a telehealth visit today.  Pt was last seen in cardiology clinic on 06/10/2023 by Slater Duncan, NP.  At that time Tashya Patch was stable from a cardiac standpoint.  The patient is now pending procedure as outlined above. Since her last visit, she has been dealing with bronchitis for the last several weeks. She believes this is causing her to have soreness in her chest. She has associated  shortness of breath with her recent illness.  She reports chronic lower extremity edema.  She denies orthopnea or PND.  She is active performing light to moderate household activities with her husband.  She is able to climb a flight of stairs.  She is independent with ADLs.  Past Medical History    Past Medical History:  Diagnosis Date   Allergy    seasonal   Anemia    Anxiety    Arthritis    Asthma    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 Protecta   Biventricular ICD (implantable cardioverter-defibrillator) in place    with CRT   Blood transfusion without reported diagnosis    Bursitis    Cataract    RIGHT EYE   CHF (congestive heart failure) (HCC)    Chronic kidney disease    Colon polyp    adenomatous   Complication of anesthesia    patient stated that had difficulty getting the breathing tube removed, patient said that she stopped breathing and HR dropped to 10  patient then woke up and started breathing pateint stated no longer than one minute; re-intubated in PACU following cholecystectomy 10/28/13   COPD (chronic obstructive pulmonary disease) (HCC)    Depression    Diverticulosis    Dysrhythmia    Fibromyalgia    GERD (gastroesophageal reflux disease)    Gout    H/O mitral valve replacement 2002, 2007   Heart murmur    Hemorrhoids    Hyperlipidemia    Hypertension    Hypothyroidism    IBS (irritable bowel syndrome)    MVP (mitral valve prolapse)  NAUSEA AND VOMITING 08/01/2009   Neuromuscular disorder (HCC)    fibromyalgia   Pacemaker    Peptic ulcer disease    Past Surgical History:  Procedure Laterality Date   ABDOMINAL HYSTERECTOMY     BIV ICD GENERATOR CHANGEOUT N/A 04/29/2017   Procedure: BIV ICD GENERATOR CHANGEOUT;  Surgeon: Luana Rumple, MD;  [facility]: [facility] INV[facility];  Service: Cardiovascular;  Laterality: N/A;   CARDIAC CATHETERIZATION  02/25/2006   normal left main, normal LAD, normal L Cfx, normal/dominant  RCA (Dr. Aleda Ammon)   CARDIAC DEFIBRILLATOR PLACEMENT  2007, 11/202012   x2 (pacemaker) (Dr. Melven Stable. Croitoru)   CARDIAC VALVE REPLACEMENT  2002   MV repair - Dr. Isla Mari   CHOLECYSTECTOMY N/A 10/28/2013   Procedure: LAPAROSCOPIC CHOLECYSTECTOMY WITH INTRAOPERATIVE CHOLANGIOGRAM;  Surgeon: Quitman Bucy, MD;  Location: [facility] OR;  Service: General;  Laterality: N/A;   COLONOSCOPY     ENTEROSCOPY N/A 11/09/2021   Procedure: ENTEROSCOPY;  Surgeon: Normie Becton., MD;  Location: [facility] ENDOSCOPY;  Service: Gastroenterology;  Laterality: N/A;   ESOPHAGOGASTRODUODENOSCOPY (EGD) WITH PROPOFOL  N/A 03/02/2021   Procedure: ESOPHAGOGASTRODUODENOSCOPY (EGD) WITH PROPOFOL ;  Surgeon: Elois Hair, MD;  Location: [facility] ENDOSCOPY;  Service: Gastroenterology;  Laterality: N/A;   EYE SURGERY     HEMOSTASIS CLIP PLACEMENT  03/02/2021   Procedure: HEMOSTASIS CLIP PLACEMENT;  Surgeon: Elois Hair, MD;  Location: [facility] ENDOSCOPY;  Service: Gastroenterology;;   HEMOSTASIS CLIP PLACEMENT  11/09/2021   Procedure: HEMOSTASIS CLIP PLACEMENT;  Surgeon: Normie Becton., MD;  Location: [facility] ENDOSCOPY;  Service: Gastroenterology;;   HOT HEMOSTASIS  03/02/2021   Procedure: HOT HEMOSTASIS (ARGON PLASMA COAGULATION/BICAP);  Surgeon: Elois Hair, MD;  Location: [facility] ENDOSCOPY;  Service: Gastroenterology;;   HOT HEMOSTASIS N/A 11/09/2021   Procedure: HOT HEMOSTASIS (ARGON PLASMA COAGULATION/BICAP);  Surgeon: Normie Becton., MD;  Location: [facility] ENDOSCOPY;  Service: Gastroenterology;  Laterality: N/A;   IMPLANTABLE CARDIOVERTER DEFIBRILLATOR (ICD) GENERATOR CHANGE N/A 04/10/2011   Procedure: ICD GENERATOR CHANGE;  Surgeon: Luana Rumple, MD;  Location: [facility] CATH LAB;  Service: Cardiovascular;  Laterality: N/A;   INSERT / REPLACE / REMOVE PACEMAKER     KNEE ARTHROSCOPY     MITRAL VALVE REPLACEMENT  02/26/2006   re-do MVR w/36mm St. [name] (Dr. Cherylynn Cosier)   NM MYOCAR PERF WALL MOTION  2005   persantine  myoview - low ris, EF 63%   RIGHT HEART CATH  04/03/2006   pulm cap wedge pressure 24/24, PA pressure 43/22 (mean ), CO 4.8, CI 4.1 (Dr. Jammie Mccune)   RIGHT/LEFT HEART CATH AND CORONARY ANGIOGRAPHY N/A 10/08/2022   Procedure: RIGHT/LEFT HEART CATH AND CORONARY ANGIOGRAPHY;  Surgeon: Millicent Ally, MD;  [facility] CV LAB;  Service: Cardiovascular;  Laterality: N/A;   SCLEROTHERAPY  03/02/2021   Procedure: SCLEROTHERAPY;  Surgeon: Elois Hair, MD;  Location: [facility] ENDOSCOPY;  Service: Gastroenterology;;   SUBMUCOSAL TATTOO INJECTION  11/09/2021   Procedure: SUBMUCOSAL TATTOO INJECTION;  Surgeon: Normie Becton., MD;  Location: [facility] ENDOSCOPY;  Service: Gastroenterology;;   TOTAL SHOULDER ARTHROPLASTY Right 12/27/2015   Procedure: RIGHT TOTAL SHOULDER ARTHROPLASTY;  Surgeon: Sammye Cristal, MD;  Location: [facility] OR;  Service: Orthopedics;  Laterality: Right;  Right total shoulder arthroplasty   TOTAL SHOULDER ARTHROPLASTY Left 10/27/2019   Procedure: REVERSE TOTAL SHOULDER ARTHROPLASTY;  Surgeon: Sammye Cristal, MD;  Location: [facility] ORS;  Service: Orthopedics;  Laterality: Left;   TRANSTHORACIC ECHOCARDIOGRAM  12/2011   EF 50-55%, mild global hypokinesis; LA severely  dilated; calcification of anterior/posterior MV leaflets, bi-leaflets St. [name] mechanical MV; mild TR; trace AV regurg/pulm valve regurg    Allergies  Allergies  Allergen Reactions   Ace Inhibitors Cough   Lipitor [Atorvastatin Calcium ] Other (See Comments)    Knee pain    Megace [Megestrol] Other (See Comments)    High blood pressure and blurred vision   Aspirin  Other (See Comments)    Nephrologist said to not take this Update 10/07/22 - patient clarifies this recommendation was lumped in with a whole bunch of other medications at that time many years ago when nephrologist told her to stay away from Tylenol , vitamins, ASA. Denies prior allergy or allergic reaction to this   Clorazepate Dipotassium Other (See  Comments)    Interacts with a drug being taken   Simvastatin Other (See Comments)    Muscle pain   Tiotropium Bromide Monohydrate Other (See Comments)    Dry mouth   Codeine Nausea And Vomiting   Tape Rash    Home Medications    Prior to Admission medications   Medication Sig Start Date End Date Taking? Authorizing Provider  acetaminophen  (TYLENOL ) 650 MG CR tablet Take 650 mg by mouth every 8 (eight) hours as needed for pain.    [provider]  allopurinol  (ZYLOPRIM ) 300 MG tablet Take 300 mg by mouth every evening.     [provider]  ALPRAZolam  (XANAX ) 0.25 MG tablet Take 0.25 mg by mouth at bedtime as needed for anxiety or sleep.     [provider]  antiseptic oral rinse (BIOTENE) LIQD 15 mLs by Mouth Rinse route as needed for dry mouth.    [provider]  buPROPion  (WELLBUTRIN  SR) 150 MG 12 hr tablet Take 150 mg by mouth in the morning.    [provider]  esomeprazole  (NEXIUM ) 40 MG capsule TAKE 1 CAPSULE BY MOUTH EVERY DAY BEFORE BREAKFAST 02/09/23   Esterwood, Amy S, PA-C  ferrous sulfate  325 (65 FE) MG tablet Take 1 tablet (325 mg total) by mouth daily with breakfast. 10/15/22   Deforest Fast, MD  furosemide  (LASIX ) 40 MG tablet Take 1 tablet (40 mg total) by mouth daily. 11/17/22   Croitoru, Mihai, MD  icosapent  Ethyl (VASCEPA ) 1 g capsule Take 1 capsule (1 g total) by mouth 2 (two) times daily. 09/03/22   Hilty, Aviva Lemmings, MD  ipratropium (ATROVENT ) 0.06 % nasal spray Place 2 sprays into both nostrils 2 (two) times daily. 07/07/22   [provider]  levothyroxine  (SYNTHROID ) 75 MCG tablet Take 75 mcg by mouth daily before breakfast.    [provider]  metoCLOPramide  (REGLAN ) 5 MG tablet Take 5 mg by mouth 2 (two) times daily as needed for nausea or vomiting. 11/05/21   [provider]  metoprolol  succinate (TOPROL -XL) 50 MG 24 hr tablet Take 0.5 tablets (25 mg total) by mouth every morning AND 1 tablet (50 mg  total) every evening. Take with or immediately following a meal.. 11/17/22   Croitoru, Mihai, MD  Multiple Vitamin (MULTIVITAMIN WITH MINERALS) TABS tablet Take 1 tablet by mouth daily.    [provider]  pravastatin  (PRAVACHOL ) 40 MG tablet Take 40 mg at bedtime by mouth.     [provider]  PROAIR  HFA 108 (90 BASE) MCG/ACT inhaler Inhale 1-2 puffs into the lungs every 6 (six) hours as needed for wheezing. 01/23/15   [provider]  Sennosides (SENNA) 15 MG TABS Take 30 mg by mouth at bedtime.  [provider]  spironolactone  (ALDACTONE ) 25 MG tablet Take 1 tablet (25 mg total) by mouth daily. 10/31/22   Ruddy Corral M, PA-C  traMADol  (ULTRAM ) 50 MG tablet Take 1 tablet (50 mg total) by mouth every 8 (eight) hours as needed for moderate pain or severe pain (1 tablet every 8 hours as needed for pain). 02/10/23   Croitoru, Mihai, MD  traZODone  (DESYREL ) 50 MG tablet Take 25 mg by mouth at bedtime as needed. 02/04/23   [provider]  valsartan  (DIOVAN ) 160 MG tablet Take 1 tablet (160 mg total) by mouth daily. 02/10/23   Croitoru, Mihai, MD  VOLTAREN 1 % GEL Apply 2-4 g topically in the morning and at bedtime.    [provider]  warfarin (COUMADIN ) 2.5 MG tablet TAKE 1 TO 2 TABLETS BY MOUTH DAILY AS DIRECTED BY THE COUMADIN  CLINIC 02/03/23   Avanell Leigh, MD    Physical Exam    Vital Signs:  Arneta Mago does not have vital signs available for review today.  Given telephonic nature of communication, physical exam is limited. AAOx3. NAD. Normal affect.  Speech and respirations are unlabored.   Assessment & Plan    Primary Cardiologist: Lauro Portal, MD  Preoperative cardiovascular risk assessment.  Thoracentesis by Dr. Matilde Son on 10/12/2023.  Chart reviewed as part of pre-operative protocol coverage. According to the RCRI, patient has a 11% risk of MACE. Patient reports activity equivalent to 4.0 METS (independent with all  ADLs, performs light to moderate household activities, able to walk a flight of stairs).   Given past medical history and time since last visit, based on ACC/AHA guidelines, Adanelly Altman would be at acceptable risk for the planned procedure without further cardiovascular testing.   Patient was advised that if she develops new symptoms prior to surgery to contact our office to arrange a follow-up appointment.  she verbalized understanding.  Patient has not stopped taking Coumadin . Per Pharm D, patient may hold Coumadin  for 5 days prior to procedure.  She will need Lovenox  (enoxaparin ) bridge around procedure.    Patient understands she will not be able to have procedure on Monday 5/12.  She will contact Dr. Matilde Son to let him know this information.  When she is provided a new date for procedure she will contact the Coumadin  clinic so Lovenox  bridge can be coordinated.  Patient verbalized understanding that she is to continue taking Coumadin  in the meantime.  I will route this recommendation to the requesting party via Epic fax function.  Please call with questions.  Time:   Today, I have spent 20 minutes with the patient with telehealth technology discussing medical history, symptoms, and management plan.     Morey Ar, NP  10/09/2023, 11:42 AM

## 2023-10-09 NOTE — Telephone Encounter (Signed)
 Pt's husband called and stated that the pt is not having the pulmonary procedure by Dr Matilde Son on 10/12/23. He states that they will have to reschedule the procedure and he will update us  once it is rescheduled.

## 2023-10-09 NOTE — Telephone Encounter (Signed)
 Patients husband returning call to schedule appt

## 2023-10-09 NOTE — Telephone Encounter (Signed)
 Patient with hx of mechanical mitral valve replacement. She will need to hold warfarin 5 days but WILL need a lovenox  bridge.

## 2023-10-09 NOTE — Telephone Encounter (Signed)
   Name: [name]  DOB: Apr 29, 1941  MRN: [id number]  Primary Cardiologist: Lauro Portal, MD   Preoperative team, please contact this patient and set up a phone call appointment for further preoperative risk assessment. Please obtain consent and complete medication review. Thank you for your help.  Patient will need tele visit today at 120.   I confirm that guidance regarding antiplatelet and oral anticoagulation therapy has been completed and, if necessary, noted below.  Patient was instructed to hold coumadin  by requesting office therefore recommendations will not be provided by pharm D.   I also confirmed the patient resides in the state of [place] . As per [facility] Medical Board telemedicine laws, the patient must reside in the state in which the provider is licensed.   Morey Ar, NP 10/09/2023, 10:13 AM [facility]

## 2023-10-09 NOTE — Progress Notes (Signed)
 Requesting office called back in regard to my call this morning if any anesthesia was being used. I did review the notes that were faxed to their office @ 11:42 am today by Morey Ar, NP. I read notes the procedure will need to be post poned for 10/12/23 as she is going to need Lovenox  bridging. I did inform their office the pt has an appt with the anticoagulation clinic 10/13/23 and they will discuss Lovenox  bridge. I did also inform them the pt has not begun to hold her coumadin .

## 2023-10-09 NOTE — Progress Notes (Signed)
 PERIOPERATIVE PRESCRIPTION FOR IMPLANTED CARDIAC DEVICE PROGRAMMING  Patient Information: Name:  Christe Bott  DOB:  1940-11-05  MRN:  [id number]  atient Name: Hitomi Lyden  DOB: [birth year] MRN: [id number]   Date of last office visit: 06/10/23 Slater Duncan, NP Date of next office visit: NONE   Request for Surgical Clearance    Procedure:  U/S GUIDED THORACENTESIS    Date of Surgery:  Clearance 10/12/23                                  Surgeon: DR. Gayl Katos SOOD Surgeon's Group or Practice Name:   [facility] Phone number:  [phone] Fax number:  [phone]    Additional requests/questions:     SignedAlec Huntington   10/09/2023, 9:38 AM    Device Information:  Clinic EP Physician:  Luana Rumple MD  Device Type:  Defibrillator Manufacturer and Phone #:  Medtronic: [id number] Pacemaker Dependent?:  No. Date of Last Device Check:  02/24/23 Normal Device Function?:  Yes.    Electrophysiologist's Recommendations:  Have magnet available. Provide continuous ECG monitoring when magnet is used or reprogramming is to be performed.  Procedure may interfere with device function.  Magnet should be placed over device during procedure.  Per Device Clinic Standing Orders, Catherin Closs, RN  12:06 PM 10/09/2023

## 2023-10-09 NOTE — Telephone Encounter (Signed)
 Patient's husband [name]) calling inquiring about clearance received for patient's procedure 5/12.  I told him that we haven't received anything, but when we do, we will reach out to them.  He verbalized understanding

## 2023-10-09 NOTE — Telephone Encounter (Signed)
 Please advise holding coumadin  prior to thoracentesis on 5/12.   Thank you!  DW

## 2023-10-09 NOTE — Telephone Encounter (Signed)
   Pre-operative Risk Assessment    Patient Name: [name]  DOB: Sep 13, 1940 MRN: [id number]   Date of last office visit: 06/10/23 Slater Duncan, NP Date of next office visit: NONE   Request for Surgical Clearance    Procedure:  U/S GUIDED THORACENTESIS   Date of Surgery:  Clearance 10/12/23                                Surgeon: DR. Gayl Katos SOOD Surgeon's Group or Practice Name:   [facility] Phone number:  [phone] Fax number:  [phone]   Type of Clearance Requested:   - Medical  - Pharmacy:  Hold Warfarin (Coumadin ) PER DR. SOOD NOTES PT HAS BEEN INFORMED ALREADY TO HOLD WARFARIN x 5 DAYS   Type of Anesthesia:  Not Indicated- LEFT MESSAGE TO CALL BACK AND CONFIRM IF ANY ANESTHESIA IS BEING USED.    Additional requests/questions:    [name]   10/09/2023, 9:38 AM

## 2023-10-09 NOTE — Telephone Encounter (Signed)
 Pt has been added on to preop APP schedule today, see previous notes from today. NP aware I did not review meds with pt but did get consent for tele.      Patient Consent for Virtual Visit        [name] has provided verbal consent on 10/09/2023 for a virtual visit (video or telephone).   CONSENT FOR VIRTUAL VISIT FOR:  [name]  By participating in this virtual visit I agree to the following:  I hereby voluntarily request, consent and authorize [facility] and its employed or contracted physicians, physician assistants, nurse practitioners or other licensed health care professionals (the Practitioner), to provide me with telemedicine health care services (the "Services") as deemed necessary by the treating Practitioner. I acknowledge and consent to receive the Services by the Practitioner via telemedicine. I understand that the telemedicine visit will involve communicating with the Practitioner through live audiovisual communication technology and the disclosure of certain medical information by electronic transmission. I acknowledge that I have been given the opportunity to request an in-person assessment or other available alternative prior to the telemedicine visit and am voluntarily participating in the telemedicine visit.  I understand that I have the right to withhold or withdraw my consent to the use of telemedicine in the course of my care at any time, without affecting my right to future care or treatment, and that the Practitioner or I may terminate the telemedicine visit at any time. I understand that I have the right to inspect all information obtained and/or recorded in the course of the telemedicine visit and may receive copies of available information for a reasonable fee.  I understand that some of the potential risks of receiving the Services via telemedicine include:  Delay or interruption in medical evaluation due to technological equipment failure  or disruption; Information transmitted may not be sufficient (e.g. poor resolution of images) to allow for appropriate medical decision making by the Practitioner; and/or  In rare instances, security protocols could fail, causing a breach of personal health information.  Furthermore, I acknowledge that it is my responsibility to provide information about my medical history, conditions and care that is complete and accurate to the best of my ability. I acknowledge that Practitioner's advice, recommendations, and/or decision may be based on factors not within their control, such as incomplete or inaccurate data provided by me or distortions of diagnostic images or specimens that may result from electronic transmissions. I understand that the practice of medicine is not an exact science and that Practitioner makes no warranties or guarantees regarding treatment outcomes. I acknowledge that a copy of this consent can be made available to me via my patient portal [facility] MyChart), or I can request a printed copy by calling the office of  [facility].    I understand that my insurance will be billed for this visit.   I have read or had this consent read to me. I understand the contents of this consent, which adequately explains the benefits and risks of the Services being provided via telemedicine.  I have been provided ample opportunity to ask questions regarding this consent and the Services and have had my questions answered to my satisfaction. I give my informed consent for the services to be provided through the use of telemedicine in my medical care

## 2023-10-12 ENCOUNTER — Inpatient Hospital Stay (HOSPITAL_COMMUNITY)
Admission: RE | Admit: 2023-10-12 | Discharge: 2023-10-12 | Disposition: A | Discharge status: Home / Self Care | Source: Ambulatory Visit | Attending: Family Medicine | Admitting: Family Medicine

## 2023-10-12 ENCOUNTER — Encounter (HOSPITAL_COMMUNITY): Payer: Self-pay

## 2023-10-13 ENCOUNTER — Ambulatory Visit: Attending: Cardiovascular Disease

## 2023-10-13 ENCOUNTER — Inpatient Hospital Stay (HOSPITAL_COMMUNITY)
Admission: EM | Admit: 2023-10-13 | Discharge: 2023-11-01 | DRG: 180 | Disposition: E | Attending: Family Medicine | Admitting: Family Medicine

## 2023-10-13 ENCOUNTER — Other Ambulatory Visit: Payer: Self-pay

## 2023-10-13 ENCOUNTER — Telehealth: Payer: Self-pay

## 2023-10-13 ENCOUNTER — Emergency Department (HOSPITAL_COMMUNITY)

## 2023-10-13 DIAGNOSIS — N179 Acute kidney failure, unspecified: Secondary | ICD-10-CM | POA: Diagnosis present

## 2023-10-13 DIAGNOSIS — Z79899 Other long term (current) drug therapy: Secondary | ICD-10-CM | POA: Diagnosis not present

## 2023-10-13 DIAGNOSIS — J188 Other pneumonia, unspecified organism: Secondary | ICD-10-CM | POA: Diagnosis present

## 2023-10-13 DIAGNOSIS — D631 Anemia in chronic kidney disease: Secondary | ICD-10-CM | POA: Diagnosis present

## 2023-10-13 DIAGNOSIS — J189 Pneumonia, unspecified organism: Secondary | ICD-10-CM | POA: Diagnosis not present

## 2023-10-13 DIAGNOSIS — F419 Anxiety disorder, unspecified: Secondary | ICD-10-CM | POA: Diagnosis present

## 2023-10-13 DIAGNOSIS — I341 Nonrheumatic mitral (valve) prolapse: Secondary | ICD-10-CM | POA: Diagnosis present

## 2023-10-13 DIAGNOSIS — J9601 Acute respiratory failure with hypoxia: Secondary | ICD-10-CM

## 2023-10-13 DIAGNOSIS — E43 Unspecified severe protein-calorie malnutrition: Secondary | ICD-10-CM | POA: Diagnosis present

## 2023-10-13 DIAGNOSIS — I480 Paroxysmal atrial fibrillation: Secondary | ICD-10-CM | POA: Diagnosis not present

## 2023-10-13 DIAGNOSIS — I482 Chronic atrial fibrillation, unspecified: Secondary | ICD-10-CM | POA: Diagnosis present

## 2023-10-13 DIAGNOSIS — Z7189 Other specified counseling: Secondary | ICD-10-CM | POA: Diagnosis not present

## 2023-10-13 DIAGNOSIS — I428 Other cardiomyopathies: Secondary | ICD-10-CM | POA: Diagnosis present

## 2023-10-13 DIAGNOSIS — T508X5A Adverse effect of diagnostic agents, initial encounter: Secondary | ICD-10-CM | POA: Diagnosis present

## 2023-10-13 DIAGNOSIS — Z515 Encounter for palliative care: Secondary | ICD-10-CM | POA: Diagnosis not present

## 2023-10-13 DIAGNOSIS — Z8711 Personal history of peptic ulcer disease: Secondary | ICD-10-CM

## 2023-10-13 DIAGNOSIS — C3490 Malignant neoplasm of unspecified part of unspecified bronchus or lung: Secondary | ICD-10-CM

## 2023-10-13 DIAGNOSIS — R042 Hemoptysis: Secondary | ICD-10-CM | POA: Diagnosis not present

## 2023-10-13 DIAGNOSIS — J440 Chronic obstructive pulmonary disease with acute lower respiratory infection: Secondary | ICD-10-CM | POA: Diagnosis present

## 2023-10-13 DIAGNOSIS — D6859 Other primary thrombophilia: Secondary | ICD-10-CM | POA: Diagnosis present

## 2023-10-13 DIAGNOSIS — Z8249 Family history of ischemic heart disease and other diseases of the circulatory system: Secondary | ICD-10-CM

## 2023-10-13 DIAGNOSIS — R1310 Dysphagia, unspecified: Secondary | ICD-10-CM | POA: Diagnosis not present

## 2023-10-13 DIAGNOSIS — Z952 Presence of prosthetic heart valve: Secondary | ICD-10-CM

## 2023-10-13 DIAGNOSIS — R918 Other nonspecific abnormal finding of lung field: Secondary | ICD-10-CM | POA: Diagnosis not present

## 2023-10-13 DIAGNOSIS — Z681 Body mass index (BMI) 19 or less, adult: Secondary | ICD-10-CM | POA: Diagnosis not present

## 2023-10-13 DIAGNOSIS — Z888 Allergy status to other drugs, medicaments and biological substances status: Secondary | ICD-10-CM

## 2023-10-13 DIAGNOSIS — E871 Hypo-osmolality and hyponatremia: Secondary | ICD-10-CM | POA: Diagnosis present

## 2023-10-13 DIAGNOSIS — M7989 Other specified soft tissue disorders: Secondary | ICD-10-CM | POA: Diagnosis not present

## 2023-10-13 DIAGNOSIS — I2489 Other forms of acute ischemic heart disease: Secondary | ICD-10-CM | POA: Diagnosis present

## 2023-10-13 DIAGNOSIS — Z87891 Personal history of nicotine dependence: Secondary | ICD-10-CM | POA: Diagnosis not present

## 2023-10-13 DIAGNOSIS — Z96611 Presence of right artificial shoulder joint: Secondary | ICD-10-CM | POA: Diagnosis present

## 2023-10-13 DIAGNOSIS — J9819 Other pulmonary collapse: Secondary | ICD-10-CM | POA: Diagnosis present

## 2023-10-13 DIAGNOSIS — J90 Pleural effusion, not elsewhere classified: Secondary | ICD-10-CM

## 2023-10-13 DIAGNOSIS — E861 Hypovolemia: Secondary | ICD-10-CM | POA: Diagnosis present

## 2023-10-13 DIAGNOSIS — Z841 Family history of disorders of kidney and ureter: Secondary | ICD-10-CM

## 2023-10-13 DIAGNOSIS — E039 Hypothyroidism, unspecified: Secondary | ICD-10-CM | POA: Diagnosis present

## 2023-10-13 DIAGNOSIS — K219 Gastro-esophageal reflux disease without esophagitis: Secondary | ICD-10-CM | POA: Diagnosis present

## 2023-10-13 DIAGNOSIS — R222 Localized swelling, mass and lump, trunk: Principal | ICD-10-CM

## 2023-10-13 DIAGNOSIS — Z91048 Other nonmedicinal substance allergy status: Secondary | ICD-10-CM

## 2023-10-13 DIAGNOSIS — J439 Emphysema, unspecified: Secondary | ICD-10-CM | POA: Diagnosis not present

## 2023-10-13 DIAGNOSIS — M797 Fibromyalgia: Secondary | ICD-10-CM | POA: Diagnosis present

## 2023-10-13 DIAGNOSIS — C3491 Malignant neoplasm of unspecified part of right bronchus or lung: Secondary | ICD-10-CM | POA: Diagnosis not present

## 2023-10-13 DIAGNOSIS — Z803 Family history of malignant neoplasm of breast: Secondary | ICD-10-CM

## 2023-10-13 DIAGNOSIS — R64 Cachexia: Secondary | ICD-10-CM | POA: Diagnosis present

## 2023-10-13 DIAGNOSIS — I130 Hypertensive heart and chronic kidney disease with heart failure and stage 1 through stage 4 chronic kidney disease, or unspecified chronic kidney disease: Secondary | ICD-10-CM | POA: Diagnosis present

## 2023-10-13 DIAGNOSIS — I447 Left bundle-branch block, unspecified: Secondary | ICD-10-CM | POA: Diagnosis present

## 2023-10-13 DIAGNOSIS — J9621 Acute and chronic respiratory failure with hypoxia: Secondary | ICD-10-CM | POA: Diagnosis present

## 2023-10-13 DIAGNOSIS — Z66 Do not resuscitate: Secondary | ICD-10-CM | POA: Diagnosis not present

## 2023-10-13 DIAGNOSIS — Z8049 Family history of malignant neoplasm of other genital organs: Secondary | ICD-10-CM

## 2023-10-13 DIAGNOSIS — R52 Pain, unspecified: Secondary | ICD-10-CM | POA: Diagnosis not present

## 2023-10-13 DIAGNOSIS — Z7901 Long term (current) use of anticoagulants: Secondary | ICD-10-CM

## 2023-10-13 DIAGNOSIS — R791 Abnormal coagulation profile: Secondary | ICD-10-CM | POA: Diagnosis not present

## 2023-10-13 DIAGNOSIS — C7971 Secondary malignant neoplasm of right adrenal gland: Secondary | ICD-10-CM | POA: Diagnosis present

## 2023-10-13 DIAGNOSIS — E785 Hyperlipidemia, unspecified: Secondary | ICD-10-CM | POA: Diagnosis present

## 2023-10-13 DIAGNOSIS — Z79890 Hormone replacement therapy (postmenopausal): Secondary | ICD-10-CM

## 2023-10-13 DIAGNOSIS — I700 Atherosclerosis of aorta: Secondary | ICD-10-CM | POA: Diagnosis present

## 2023-10-13 DIAGNOSIS — R7989 Other specified abnormal findings of blood chemistry: Secondary | ICD-10-CM | POA: Diagnosis not present

## 2023-10-13 DIAGNOSIS — I2510 Atherosclerotic heart disease of native coronary artery without angina pectoris: Secondary | ICD-10-CM | POA: Diagnosis present

## 2023-10-13 DIAGNOSIS — N1832 Chronic kidney disease, stage 3b: Secondary | ICD-10-CM | POA: Diagnosis present

## 2023-10-13 DIAGNOSIS — I358 Other nonrheumatic aortic valve disorders: Secondary | ICD-10-CM | POA: Diagnosis present

## 2023-10-13 DIAGNOSIS — R079 Chest pain, unspecified: Secondary | ICD-10-CM | POA: Diagnosis not present

## 2023-10-13 DIAGNOSIS — I5042 Chronic combined systolic (congestive) and diastolic (congestive) heart failure: Secondary | ICD-10-CM | POA: Diagnosis present

## 2023-10-13 DIAGNOSIS — Z860100 Personal history of colon polyps, unspecified: Secondary | ICD-10-CM

## 2023-10-13 DIAGNOSIS — M109 Gout, unspecified: Secondary | ICD-10-CM | POA: Diagnosis present

## 2023-10-13 DIAGNOSIS — D649 Anemia, unspecified: Secondary | ICD-10-CM | POA: Diagnosis present

## 2023-10-13 DIAGNOSIS — J9859 Other diseases of mediastinum, not elsewhere classified: Secondary | ICD-10-CM | POA: Diagnosis not present

## 2023-10-13 DIAGNOSIS — Z95810 Presence of automatic (implantable) cardiac defibrillator: Secondary | ICD-10-CM | POA: Diagnosis present

## 2023-10-13 DIAGNOSIS — Z886 Allergy status to analgesic agent status: Secondary | ICD-10-CM

## 2023-10-13 DIAGNOSIS — N183 Chronic kidney disease, stage 3 unspecified: Secondary | ICD-10-CM | POA: Insufficient documentation

## 2023-10-13 DIAGNOSIS — C3431 Malignant neoplasm of lower lobe, right bronchus or lung: Principal | ICD-10-CM | POA: Diagnosis present

## 2023-10-13 DIAGNOSIS — Z96612 Presence of left artificial shoulder joint: Secondary | ICD-10-CM | POA: Diagnosis present

## 2023-10-13 DIAGNOSIS — D6869 Other thrombophilia: Secondary | ICD-10-CM | POA: Insufficient documentation

## 2023-10-13 DIAGNOSIS — N1411 Contrast-induced nephropathy: Secondary | ICD-10-CM | POA: Diagnosis present

## 2023-10-13 DIAGNOSIS — E8809 Other disorders of plasma-protein metabolism, not elsewhere classified: Secondary | ICD-10-CM | POA: Diagnosis present

## 2023-10-13 DIAGNOSIS — Z90710 Acquired absence of both cervix and uterus: Secondary | ICD-10-CM

## 2023-10-13 DIAGNOSIS — R590 Localized enlarged lymph nodes: Secondary | ICD-10-CM | POA: Diagnosis not present

## 2023-10-13 DIAGNOSIS — R54 Age-related physical debility: Secondary | ICD-10-CM | POA: Diagnosis present

## 2023-10-13 DIAGNOSIS — R0600 Dyspnea, unspecified: Secondary | ICD-10-CM | POA: Diagnosis not present

## 2023-10-13 LAB — CBC
HCT: 33.2 % — ABNORMAL LOW (ref 36.0–46.0)
Hemoglobin: 10.5 g/dL — ABNORMAL LOW (ref 12.0–15.0)
MCH: 30.6 pg (ref 26.0–34.0)
MCHC: 31.6 g/dL (ref 30.0–36.0)
MCV: 96.8 fL (ref 80.0–100.0)
Platelets: 340 10*3/uL (ref 150–400)
RBC: 3.43 MIL/uL — ABNORMAL LOW (ref 3.87–5.11)
RDW: 14.4 % (ref 11.5–15.5)
WBC: 16.8 10*3/uL — ABNORMAL HIGH (ref 4.0–10.5)
nRBC: 0 % (ref 0.0–0.2)

## 2023-10-13 LAB — LACTIC ACID, PLASMA: Lactic Acid, Venous: 1 mmol/L (ref 0.5–1.9)

## 2023-10-13 LAB — BASIC METABOLIC PANEL WITH GFR
Anion gap: 10 (ref 5–15)
BUN: 16 mg/dL (ref 8–23)
CO2: 26 mmol/L (ref 22–32)
Calcium: 10.2 mg/dL (ref 8.9–10.3)
Chloride: 98 mmol/L (ref 98–111)
Creatinine, Ser: 1 mg/dL (ref 0.44–1.00)
GFR, Estimated: 57 mL/min — ABNORMAL LOW (ref >60)
Glucose, Bld: 136 mg/dL — ABNORMAL HIGH (ref 70–99)
Potassium: 3.7 mmol/L (ref 3.5–5.1)
Sodium: 134 mmol/L — ABNORMAL LOW (ref 135–145)

## 2023-10-13 LAB — TSH: TSH: 5.61 u[IU]/mL — ABNORMAL HIGH (ref 0.350–4.500)

## 2023-10-13 LAB — POCT INR: INR: 4.4 — AB (ref 2.0–3.0)

## 2023-10-13 LAB — TROPONIN I (HIGH SENSITIVITY)
Troponin I (High Sensitivity): 1614 ng/L (ref <18)
Troponin I (High Sensitivity): 1306 ng/L (ref <18)

## 2023-10-13 LAB — BRAIN NATRIURETIC PEPTIDE: B Natriuretic Peptide: 580.4 pg/mL — ABNORMAL HIGH (ref 0.0–100.0)

## 2023-10-13 MED ORDER — VANCOMYCIN HCL 750 MG/150ML IV SOLN
750.0000 mg | Freq: Once | INTRAVENOUS | Status: AC
  Administered 2023-10-13: 750 mg via INTRAVENOUS
  Filled 2023-10-13: qty 150

## 2023-10-13 MED ORDER — PANTOPRAZOLE SODIUM 40 MG IV SOLR
40.0000 mg | Freq: Every day | INTRAVENOUS | Status: DC
  Administered 2023-10-14 – 2023-10-18 (×5): 40 mg via INTRAVENOUS
  Filled 2023-10-13 (×6): qty 10

## 2023-10-13 MED ORDER — PIPERACILLIN-TAZOBACTAM 3.375 G IVPB
3.3750 g | Freq: Three times a day (TID) | INTRAVENOUS | Status: DC
  Administered 2023-10-14 – 2023-10-16 (×7): 3.375 g via INTRAVENOUS
  Filled 2023-10-13 (×8): qty 50

## 2023-10-13 MED ORDER — METOCLOPRAMIDE HCL 5 MG PO TABS: 5.0000 mg | ORAL_TABLET | Freq: Two times a day (BID) | ORAL | Status: DC | PRN

## 2023-10-13 MED ORDER — FUROSEMIDE 20 MG PO TABS
20.0000 mg | ORAL_TABLET | Freq: Two times a day (BID) | ORAL | Status: DC
  Administered 2023-10-14 – 2023-10-15 (×3): 20 mg via ORAL
  Filled 2023-10-13 (×3): qty 1

## 2023-10-13 MED ORDER — METOPROLOL SUCCINATE ER 25 MG PO TB24
50.0000 mg | ORAL_TABLET | Freq: Every evening | ORAL | Status: DC
  Administered 2023-10-14 – 2023-10-21 (×8): 50 mg via ORAL
  Filled 2023-10-13 (×8): qty 1

## 2023-10-13 MED ORDER — BUPROPION HCL ER (SR) 150 MG PO TB12
150.0000 mg | ORAL_TABLET | Freq: Every morning | ORAL | Status: DC
Start: 2023-10-14 — End: 2023-10-22
  Administered 2023-10-14 – 2023-10-22 (×9): 150 mg via ORAL
  Filled 2023-10-13 (×9): qty 1

## 2023-10-13 MED ORDER — ALPRAZOLAM 0.25 MG PO TABS
0.2500 mg | ORAL_TABLET | Freq: Every evening | ORAL | Status: DC | PRN
  Administered 2023-10-17 – 2023-10-22 (×4): 0.25 mg via ORAL
  Filled 2023-10-13 (×4): qty 1

## 2023-10-13 MED ORDER — BIOTENE DRY MOUTH MT LIQD: 15.0000 mL | OROMUCOSAL | Status: DC | PRN

## 2023-10-13 MED ORDER — BUDESON-GLYCOPYRROL-FORMOTEROL 160-9-4.8 MCG/ACT IN AERO
2.0000 | INHALATION_SPRAY | Freq: Two times a day (BID) | RESPIRATORY_TRACT | Status: DC
  Administered 2023-10-14 – 2023-10-22 (×17): 2 via RESPIRATORY_TRACT
  Filled 2023-10-13 (×2): qty 5.9

## 2023-10-13 MED ORDER — METOPROLOL SUCCINATE ER 25 MG PO TB24
25.0000 mg | ORAL_TABLET | ORAL | Status: DC
  Administered 2023-10-14 – 2023-10-22 (×8): 25 mg via ORAL
  Filled 2023-10-13 (×9): qty 1

## 2023-10-13 MED ORDER — PRAVASTATIN SODIUM 20 MG PO TABS
40.0000 mg | ORAL_TABLET | Freq: Every day | ORAL | Status: DC
  Administered 2023-10-14 – 2023-10-21 (×8): 40 mg via ORAL
  Filled 2023-10-13 (×8): qty 1

## 2023-10-13 MED ORDER — SENNA 8.6 MG PO TABS
30.0000 mg | ORAL_TABLET | Freq: Every day | ORAL | Status: DC
  Administered 2023-10-14: 30 mg via ORAL
  Filled 2023-10-13: qty 4

## 2023-10-13 MED ORDER — IOHEXOL 350 MG/ML SOLN
75.0000 mL | Freq: Once | INTRAVENOUS | Status: AC | PRN
  Administered 2023-10-13: 75 mL via INTRAVENOUS

## 2023-10-13 MED ORDER — WARFARIN - PHARMACIST DOSING INPATIENT: Freq: Every day | Status: DC

## 2023-10-13 MED ORDER — LEVOTHYROXINE SODIUM 75 MCG PO TABS
75.0000 ug | ORAL_TABLET | Freq: Every day | ORAL | Status: DC
  Administered 2023-10-15 – 2023-10-22 (×7): 75 ug via ORAL
  Filled 2023-10-13 (×9): qty 1

## 2023-10-13 MED ORDER — IRBESARTAN 150 MG PO TABS
150.0000 mg | ORAL_TABLET | Freq: Every day | ORAL | Status: DC
  Administered 2023-10-14 – 2023-10-15 (×2): 150 mg via ORAL
  Filled 2023-10-13 (×2): qty 1

## 2023-10-13 MED ORDER — TRAMADOL HCL 50 MG PO TABS
50.0000 mg | ORAL_TABLET | Freq: Three times a day (TID) | ORAL | Status: DC | PRN
  Administered 2023-10-17 – 2023-10-20 (×4): 50 mg via ORAL
  Filled 2023-10-13 (×4): qty 1

## 2023-10-13 MED ORDER — PIPERACILLIN-TAZOBACTAM 3.375 G IVPB 30 MIN
3.3750 g | Freq: Once | INTRAVENOUS | Status: AC
  Administered 2023-10-13: 3.375 g via INTRAVENOUS
  Filled 2023-10-13: qty 50

## 2023-10-13 MED ORDER — SPIRONOLACTONE 25 MG PO TABS
25.0000 mg | ORAL_TABLET | Freq: Every day | ORAL | Status: DC
  Administered 2023-10-14 – 2023-10-15 (×2): 25 mg via ORAL
  Filled 2023-10-13 (×2): qty 1

## 2023-10-13 MED ORDER — TRAZODONE HCL 50 MG PO TABS
25.0000 mg | ORAL_TABLET | Freq: Every evening | ORAL | Status: DC | PRN
  Administered 2023-10-18 – 2023-10-21 (×4): 25 mg via ORAL
  Filled 2023-10-13 (×4): qty 1

## 2023-10-13 MED ORDER — ALBUTEROL SULFATE (2.5 MG/3ML) 0.083% IN NEBU
3.0000 mL | INHALATION_SOLUTION | Freq: Four times a day (QID) | RESPIRATORY_TRACT | Status: DC | PRN
  Administered 2023-10-16 – 2023-10-22 (×11): 3 mL via RESPIRATORY_TRACT
  Filled 2023-10-13 (×11): qty 3

## 2023-10-13 MED ORDER — ICOSAPENT ETHYL 1 G PO CAPS
1.0000 g | ORAL_CAPSULE | Freq: Two times a day (BID) | ORAL | Status: DC
  Administered 2023-10-14 – 2023-10-22 (×16): 1 g via ORAL
  Filled 2023-10-13 (×18): qty 1

## 2023-10-13 MED ORDER — ACETAMINOPHEN 325 MG PO TABS
650.0000 mg | ORAL_TABLET | Freq: Three times a day (TID) | ORAL | Status: DC | PRN
  Filled 2023-10-13: qty 2

## 2023-10-13 NOTE — ED Provider Notes (Signed)
 [place] EMERGENCY DEPARTMENT AT [facility] Provider Note   CSN: [id number] Arrival date & time: 10/13/23  1507     History  Chief Complaint  Patient presents with   Shortness of Breath    [name] is a 83 y.o. female.  The history is provided by the patient, the spouse and medical records. No language interpreter was used.  Shortness of Breath Severity:  Severe Onset quality:  Gradual Duration:  2 days Timing:  Constant Progression:  Waxing and waning Chronicity:  Recurrent Context: URI   Relieved by:  Rest Worsened by:  Deep breathing, coughing and exertion Ineffective treatments:  Inhaler Associated symptoms: chest pain, cough, hemoptysis, neck pain (feels like throat is tight at times) and sputum production   Associated symptoms: no abdominal pain, no fever, no headaches, no rash, no sore throat, no syncope, no vomiting and no wheezing        Home Medications Prior to Admission medications   Medication Sig Start Date End Date Taking? Authorizing Provider  acetaminophen  (TYLENOL ) 650 MG CR tablet Take 650 mg by mouth every 8 (eight) hours as needed for pain.    [provider]  allopurinol  (ZYLOPRIM ) 300 MG tablet Take 300 mg by mouth every evening.     [provider]  ALPRAZolam  (XANAX ) 0.25 MG tablet Take 0.25 mg by mouth at bedtime as needed for anxiety or sleep.     [provider]  antiseptic oral rinse (BIOTENE) LIQD 15 mLs by Mouth Rinse route as needed for dry mouth.    [provider]  buPROPion  (WELLBUTRIN  SR) 150 MG 12 hr tablet Take 150 mg by mouth in the morning.    [provider]  esomeprazole  (NEXIUM ) 40 MG capsule TAKE 1 CAPSULE BY MOUTH EVERY DAY BEFORE BREAKFAST 02/09/23   Esterwood, Amy S, PA-C  ferrous sulfate  325 (65 FE) MG tablet Take 1 tablet (325 mg total) by mouth daily with breakfast. 10/15/22   Deforest Fast, MD  furosemide  (LASIX ) 40 MG tablet Take 1 tablet (40 mg total)  by mouth daily. 11/17/22   Croitoru, Mihai, MD  icosapent  Ethyl (VASCEPA ) 1 g capsule Take 1 capsule (1 g total) by mouth 2 (two) times daily. 09/03/22   Hilty, Aviva Lemmings, MD  ipratropium (ATROVENT ) 0.06 % nasal spray Place 2 sprays into both nostrils 2 (two) times daily. 07/07/22   [provider]  levothyroxine  (SYNTHROID ) 75 MCG tablet Take 75 mcg by mouth daily before breakfast.    [provider]  metoCLOPramide  (REGLAN ) 5 MG tablet Take 5 mg by mouth 2 (two) times daily as needed for nausea or vomiting. 11/05/21   [provider]  metoprolol  succinate (TOPROL -XL) 50 MG 24 hr tablet Take 0.5 tablets (25 mg total) by mouth every morning AND 1 tablet (50 mg total) every evening. Take with or immediately following a meal.. 11/17/22   Croitoru, Mihai, MD  Multiple Vitamin (MULTIVITAMIN WITH MINERALS) TABS tablet Take 1 tablet by mouth daily.    [provider]  pravastatin  (PRAVACHOL ) 40 MG tablet Take 40 mg at bedtime by mouth.     [provider]  PROAIR  HFA 108 (90 BASE) MCG/ACT inhaler Inhale 1-2 puffs into the lungs every 6 (six) hours as needed for wheezing. 01/23/15   [provider]  Sennosides (SENNA) 15 MG TABS Take 30 mg by mouth at bedtime.    [provider]  spironolactone  (ALDACTONE ) 25 MG tablet Take 1 tablet (25 mg total)  by mouth daily. 10/31/22   Ruddy Corral M, PA-C  traMADol  (ULTRAM ) 50 MG tablet Take 1 tablet (50 mg total) by mouth every 8 (eight) hours as needed for moderate pain or severe pain (1 tablet every 8 hours as needed for pain). 02/10/23   Croitoru, Mihai, MD  traZODone  (DESYREL ) 50 MG tablet Take 25 mg by mouth at bedtime as needed. 02/04/23   [provider]  valsartan  (DIOVAN ) 160 MG tablet Take 1 tablet (160 mg total) by mouth daily. 02/10/23   Croitoru, Mihai, MD  VOLTAREN 1 % GEL Apply 2-4 g topically in the morning and at bedtime.    [provider]  warfarin (COUMADIN ) 2.5 MG tablet TAKE  1 TO 2 TABLETS BY MOUTH DAILY AS DIRECTED BY THE COUMADIN  CLINIC 02/03/23   Avanell Leigh, MD      Allergies    Ace inhibitors, Lipitor [atorvastatin calcium ], Megace [megestrol], Aspirin , Clorazepate dipotassium, Simvastatin, Tiotropium bromide monohydrate, Codeine, and Tape    Review of Systems   Review of Systems  Constitutional:  Positive for chills and fatigue. Negative for fever.  HENT:  Positive for trouble swallowing. Negative for congestion, sinus pain and sore throat.   Respiratory:  Positive for cough, hemoptysis, sputum production, chest tightness, shortness of breath and stridor. Negative for choking (no specific shoking epiusodes but pt has difficulty swallowing) and wheezing.   Cardiovascular:  Positive for chest pain. Negative for palpitations, leg swelling and syncope.  Gastrointestinal:  Negative for abdominal pain, constipation, diarrhea, nausea and vomiting.  Genitourinary:  Negative for dysuria and flank pain.  Musculoskeletal:  Positive for neck pain (feels like throat is tight at times). Negative for back pain and neck stiffness.  Skin:  Negative for rash and wound.  Neurological:  Positive for light-headedness. Negative for dizziness and headaches.  Psychiatric/Behavioral:  Negative for agitation and confusion.   All other systems reviewed and are negative.   Physical Exam Updated Vital Signs BP (!) 155/66 (BP Location: Right Arm)   Pulse 86   Temp 97.9 F (36.6 C)   Resp (!) 28   SpO2 98%  Physical Exam Vitals and nursing note reviewed.  Constitutional:      General: She is not in acute distress.    Appearance: She is well-developed. She is not ill-appearing, toxic-appearing or diaphoretic.  HENT:     Head: Normocephalic and atraumatic.     Nose: No congestion.     Mouth/Throat:     Mouth: Mucous membranes are dry.     Pharynx: No oropharyngeal exudate or posterior oropharyngeal erythema.  Eyes:     Extraocular Movements: Extraocular movements  intact.     Conjunctiva/sclera: Conjunctivae normal.     Pupils: Pupils are equal, round, and reactive to light.  Cardiovascular:     Rate and Rhythm: Normal rate and regular rhythm.     Pulses: Normal pulses.     Heart sounds: Murmur heard.  Pulmonary:     Breath sounds: Stridor present. Rhonchi and rales present.  Chest:     Chest wall: Tenderness present.  Abdominal:     General: Abdomen is flat.     Palpations: Abdomen is soft.     Tenderness: There is no abdominal tenderness. There is no right CVA tenderness, left CVA tenderness, guarding or rebound.  Musculoskeletal:        General: No swelling or tenderness.     Cervical back: Neck supple. No tenderness.     Right lower leg: No edema.  Left lower leg: No edema.  Skin:    General: Skin is warm and dry.     Capillary Refill: Capillary refill takes less than 2 seconds.     Findings: No erythema or rash.  Neurological:     General: No focal deficit present.     Mental Status: She is alert.  Psychiatric:        Mood and Affect: Mood normal.     ED Results / Procedures / Treatments   Labs (all labs ordered are listed, but only abnormal results are displayed) Labs Reviewed  BASIC METABOLIC PANEL WITH GFR - Abnormal; Notable for the following components:      Result Value   Sodium 134 (*)    Glucose, Bld 136 (*)    GFR, Estimated 57 (*)    All other components within normal limits  CBC - Abnormal; Notable for the following components:   WBC 16.8 (*)    RBC 3.43 (*)    Hemoglobin 10.5 (*)    HCT 33.2 (*)    All other components within normal limits  BRAIN NATRIURETIC PEPTIDE - Abnormal; Notable for the following components:   B Natriuretic Peptide 580.4 (*)    All other components within normal limits  TSH - Abnormal; Notable for the following components:   TSH 5.610 (*)    All other components within normal limits  TROPONIN I (HIGH SENSITIVITY) - Abnormal; Notable for the following components:   Troponin I  (High Sensitivity) 1,614 (*)    All other components within normal limits  TROPONIN I (HIGH SENSITIVITY) - Abnormal; Notable for the following components:   Troponin I (High Sensitivity) 1,306 (*)    All other components within normal limits  CULTURE, BLOOD (ROUTINE X 2)  CULTURE, BLOOD (ROUTINE X 2)  MRSA NEXT GEN BY PCR, NASAL  LACTIC ACID, PLASMA  COMPREHENSIVE METABOLIC PANEL WITH GFR  CBC  PROTIME-INR    EKG EKG Interpretation Date/Time:  Tuesday Oct 13 2023 15:14:40 EDT Ventricular Rate:  91 PR Interval:    QRS Duration:  142 QT Interval:  424 QTC Calculation: 521 R Axis:   119  Text Interpretation: paced Left ventricular hypertrophy with QRS widening and repolarization abnormality ( Cornell product , Romhilt-Estes ) Anterolateral infarct , age undetermined Abnormal ECG When compared with ECG of 29-Oct-2022 15:36, PREVIOUS ECG IS PRESENT when compared to prior, faster rhythm that does appeara paced. NO STEMI Confirmed by Wynell Heath ([id number]) on 10/13/2023 4:46:56 PM  Radiology CT Soft Tissue Neck W Contrast Result Date: 10/13/2023 CLINICAL DATA:  Stridor and chest pain EXAM: CT NECK WITH CONTRAST TECHNIQUE: Multidetector CT imaging of the neck was performed using the standard protocol following the bolus administration of intravenous contrast. RADIATION DOSE REDUCTION: This exam was performed according to the departmental dose-optimization program which includes automated exposure control, adjustment of the mA and/or kV according to patient size and/or use of iterative reconstruction technique. CONTRAST:  75mL OMNIPAQUE  IOHEXOL  350 MG/ML SOLN COMPARISON:  None Available. FINDINGS: Pharynx and larynx: Normal. No mass or swelling. Salivary glands: No inflammation, mass, or stone. Thyroid: Normal Lymph nodes: None enlarged or abnormal density. Vascular: Numerous small caliber collaterals surrounding the spinal column. Major vessels are patent. Calcific aortic atherosclerosis. Limited  intracranial: Normal Visualized orbits: Normal Mastoids and visualized paranasal sinuses: Clear Skeleton: Negative Upper chest: Biapical emphysema. Other: Patulous debris containing lower cervical and upper thoracic esophagus. IMPRESSION: 1. Patulous debris containing lower cervical and upper thoracic esophagus. 2. No acute abnormality  of the neck. Aortic Atherosclerosis (ICD10-I70.0) and Emphysema (ICD10-J43.9). Electronically Signed   By: Juanetta Nordmann M.D.   On: 10/13/2023 19:45   CT Angio Chest PE W and/or Wo Contrast Result Date: 10/13/2023 CLINICAL DATA:  Chest discomfort, difficulty breathing, stridor, throat tightness EXAM: CT ANGIOGRAPHY CHEST WITH CONTRAST TECHNIQUE: Multidetector CT imaging of the chest was performed using the standard protocol during bolus administration of intravenous contrast. Multiplanar CT image reconstructions and MIPs were obtained to evaluate the vascular anatomy. RADIATION DOSE REDUCTION: This exam was performed according to the departmental dose-optimization program which includes automated exposure control, adjustment of the mA and/or kV according to patient size and/or use of iterative reconstruction technique. CONTRAST:  75mL OMNIPAQUE  IOHEXOL  350 MG/ML SOLN COMPARISON:  10/13/2023, 06/22/2022 FINDINGS: Cardiovascular: This is a technically adequate evaluation of the pulmonary vasculature. No filling defects or pulmonary emboli. The heart is enlarged, with marked left ventricular dilatation. Multi lead cardiac pacer again identified. No evidence of thoracic aortic aneurysm or dissection. Atherosclerosis throughout the aorta and coronary vasculature. 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 Mediastinum/Nodes: Subcarinal soft tissue mass measures up to 3.2 x 5.6 cm, reference image 63/4. This has mass effect upon the midthoracic  esophagus, which is deviated to the left. This results in proximal esophageal dilatation and retained secretions. The trachea appears unremarkable. Thyroid is not well visualized. Lungs/Pleura: There is complete consolidation with volume loss in the right lower lobe. There is opacification of the bronchus intermedius and right lower lobe bronchus. Central obstructing neoplasm cannot be excluded. Upper lobe predominant emphysema. Trace right pleural effusion. No pneumothorax. Upper Abdomen: Bilateral adrenal nodules, measuring 14 mm on the right and 10 mm on the left. No other acute upper abdominal findings. Musculoskeletal: No acute or destructive bony abnormalities. Bilateral shoulder arthroplasties. Reconstructed images demonstrate no additional findings. Review of the MIP images confirms the above findings. IMPRESSION: 1. Large soft tissue mass in the subcarinal region, with mass effect upon the midthoracic esophagus. Findings compatible with malignancy or adenopathy. 2. Dense consolidation of the right lower lobe with volume loss, as well as opacification of the bronchus intermedius and right lower lobe bronchial tree. Central obstructing neoplasm is suspected. Bronchoscopy may be useful. 3. Trace right pleural effusion. 4. No evidence of pulmonary embolus. 5. Nonspecific bilateral adrenal nodularity, metastatic disease not excluded in light of intrathoracic findings. 6. Cardiomegaly with prominent left ventricular dilatation. 7. Aortic Atherosclerosis (ICD10-I70.0) and Emphysema (ICD10-J43.9). Electronically Signed   By: Bobbye Burrow M.D.   On: 10/13/2023 18:51   DG Chest 2 View Result Date: 10/13/2023 CLINICAL DATA:  Shortness of breath.  Wheezing. EXAM: CHEST - 2 VIEW COMPARISON:  Chest radiographs 10/03/2022, 06/20/2022, 11/18/2021 FINDINGS: Status post median sternotomy and mitral valve prosthesis, unchanged. Mild high-grade atherosclerotic calcifications within the aortic arch. Left chest wall AICD  with leads overlying the right atrium, right antrum ventricle, and coronary sinus. Cardiac silhouette is again at the upper limits of normal size. Mediastinal contours are within normal limits. Mild bilateral interstitial thickening is actually improved from 10/03/2022 and 06/20/2022 and may represent the patient's normal baseline, mild chronic fibrosis. Small to moderate right pleural effusion and associated right basilar heterogeneous airspace opacification appears to be new from prior. No pneumothorax. Status post reverse total left shoulder arthroplasty and standard total right shoulder arthroplasty. IMPRESSION: 1. Small to moderate right pleural effusion and associated right basilar heterogeneous  airspace opacification appears to be new from prior. This may represent atelectasis or pneumonia. 2. Mild bilateral interstitial thickening is actually improved from 10/03/2022 and 06/20/2022 and may represent the patient's normal baseline, mild chronic fibrosis. Electronically Signed   By: Bertina Broccoli M.D.   On: 10/13/2023 16:32    Procedures Procedures    CRITICAL CARE Performed by: Marine Sia Kandra Graven Total critical care time: 45 minutes Critical care time was exclusive of separately billable procedures and treating other patients. Critical care was necessary to treat or prevent imminent or life-threatening deterioration. Critical care was time spent personally by me on the following activities: development of treatment plan with patient and/or surrogate as well as nursing, discussions with consultants, evaluation of patient's response to treatment, examination of patient, obtaining history from patient or surrogate, ordering and performing treatments and interventions, ordering and review of laboratory studies, ordering and review of radiographic studies, pulse oximetry and re-evaluation of patient's condition.   Medications Ordered in ED Medications  albuterol  (PROVENTIL ) (2.5 MG/3ML) 0.083%  nebulizer solution 3 mL (has no administration in time range)  senna (SENOKOT) tablet 30 mg (has no administration in time range)  pravastatin  (PRAVACHOL ) tablet 40 mg (has no administration in time range)  ALPRAZolam  (XANAX ) tablet 0.25 mg (has no administration in time range)  acetaminophen  (TYLENOL ) tablet 650 mg (has no administration in time range)  buPROPion  (WELLBUTRIN  SR) 12 hr tablet 150 mg (has no administration in time range)  antiseptic oral rinse (BIOTENE) solution 15 mL (has no administration in time range)  metoCLOPramide  (REGLAN ) tablet 5 mg (has no administration in time range)  levothyroxine  (SYNTHROID ) tablet 75 mcg (has no administration in time range)  icosapent  Ethyl (VASCEPA ) 1 g capsule 1 g (has no administration in time range)  spironolactone  (ALDACTONE ) tablet 25 mg (has no administration in time range)  traZODone  (DESYREL ) tablet 25 mg (has no administration in time range)  traMADol  (ULTRAM ) tablet 50 mg (has no administration in time range)  furosemide  (LASIX ) tablet 20 mg (has no administration in time range)  metoprolol  succinate (TOPROL -XL) 24 hr tablet 25 mg (has no administration in time range)    And  metoprolol  succinate (TOPROL -XL) 24 hr tablet 50 mg (has no administration in time range)  irbesartan (AVAPRO) tablet 150 mg (has no administration in time range)  pantoprazole  (PROTONIX ) injection 40 mg (has no administration in time range)  budeson-glycopyrrolate -formoterol (BREZTRI) 160-9-4.8 MCG/ACT inhaler 2 puff (has no administration in time range)  Warfarin - Pharmacist Dosing Inpatient (has no administration in time range)  piperacillin-tazobactam (ZOSYN) IVPB 3.375 g (has no administration in time range)  iohexol  (OMNIPAQUE ) 350 MG/ML injection 75 mL (75 mLs Intravenous Contrast Given 10/13/23 1836)  vancomycin (VANCOREADY) IVPB 750 mg/150 mL (750 mg Intravenous New Bag/Given 10/13/23 2106)  piperacillin-tazobactam (ZOSYN) IVPB 3.375 g (0 g Intravenous  Stopped 10/13/23 2051)    ED Course/ Medical Decision Making/ A&P                                 Medical Decision Making Amount and/or Complexity of Data Reviewed Labs: ordered. Radiology: ordered.  Risk Prescription drug management. Decision regarding hospitalization.    Kimberlly Hubanks is a 83 y.o. female with a past medical history significant for CHF with ICD/pacemaker, mechanical mitral valve replacement on Coumadin  therapy, CKD, paroxysmal atrial fibrillation, for myalgia, hypertension, hyperlipidemia, COPD, and peptic ulcer disease who presents with subjective chills, productive cough, worsening  shortness of breath, chest pain, difficulty swallowing and feeling tightness in her neck, lightheadedness, and fatigue.  According to patient, she has had some troubles on and off since January with shortness of breath and what they thought was bronchitis.  She was supposed to have a thoracentesis yesterday for some pleural effusion that was found when they were working up for bronchitis 2 weeks ago but there was some problem and she was still on the Coumadin .  She reports today it was in the 4 range and due to this worsening chest pain and shortness of breath they sent her to the emergency department.  She reports that she is having chest pain across her chest like a band since yesterday it is severe.  Hurts to take a deep breath or cough and she gets more pain with exertion.  She reports he tried take a shower and was so winded she felt she was going to pass out.  She denies history of blood clots and denies any leg pain or leg swelling.  She reports she does not feel fluid overloaded in her legs.  She has had decreased oral intake because is difficult to swallow and she is only having soup because she is worried she will choke.  She denies any choking episodes or aspiration events.  She denies any falls or trauma.  She reports the chest pain is not improving with albuterol  inhaler.  She said  she has had some these episodes on and off since January but this has acutely worsened yesterday.  When she was seen at the Coumadin  clinic she was sent in for evaluation.  Otherwise she denies constipation, diarrhea, urinary changes.  Denies any headache or neck pain.  She reports it does feel like her neck is tight inside but is denying significant sore throat.  Denies any neurological plaints.  On my exam, lungs have rales and rhonchi but I do not appreciate wheezing.  I did hear some mild stridor on neck auscultation.  Chest was slightly tender.  She has a clicking murmur.  Intact pulses in extremities and she has no significant edema in legs.  Oxygen saturation's are in the mid 90s on room air but she got visibly winded when she tried to talk.   EKG did not show STEMI and appeared paced.  Clinically I am concerned about this acute worsening over the last 24 hours.  I anticipate she will likely need admission either for thoracentesis versus treatment of other findings.  She had an x-ray in triage that did reveal evidence of worsened pleural effusion and possible pneumonia.  Given her report of recent hemoptysis, practice cough, the pleuritic chest pain shortness of breath, I am somewhat concerned need to get a CT PE study to rule out pulmonary embolism versus some mass or obstructive problem.  With her reports that she cannot swallow well and feels like something is tighter in her throat will get a CT of the neck as well.  She does have a leukocytosis on her initial blood work, will get lactic acid blood cultures and other labs as well.  Slight anemia is improved from prior.  Will get a BNP given the heart failure, troponins, and anticipate admission after images are completed.  Anticipate admission for pneumonia and symptomatic pleural effusion if other imaging does not show other findings.  7:59 PM Imaging did not show pulmonary [place] but unfortunately does show consolidation fluid and masses  pushing on the esophagus and bronchus.  Suspect this is  the cause of her symptoms.  Will give antibiotics for the white count and pneumonia symptoms and will call for admission.  The CT did recommend possible bronchoscopy so we will touch base with pulmonology to see if they want to make her n.p.o. for that but will call for medical admission.  8:11 PM Troponin just returned elevated over 1600.  Will call cardiology as well as there could be some component of NSTEMI related to this mass.  She is still having some chest discomfort.  8:21 PM Spoke again to both cardiology and pulmonology.  Pulmonology did not think she needed to get a bronchoscopy emergently and thus she does not need to be n.p.o. at this time.  Cardiology did not feel she is having a STEMI and agreed with getting an echo and trending her troponin.  Will call medicine for admission for further management.        Final Clinical Impression(s) / ED Diagnoses Final diagnoses:  Mass in chest  Pneumonia due to infectious organism, unspecified laterality, unspecified part of lung  Pleural effusion     Clinical Impression: 1. Mass in chest   2. Pneumonia due to infectious organism, unspecified laterality, unspecified part of lung   3. Pleural effusion     Disposition: Admit  This note was prepared with assistance of Dragon voice recognition software. Occasional wrong-word or sound-a-like substitutions may have occurred due to the inherent limitations of voice recognition software.      Jedidiah Demartini, Marine Sia, MD 10/13/23 [phone]

## 2023-10-13 NOTE — ED Notes (Signed)
 Ambulatory to RR with standby assist.

## 2023-10-13 NOTE — ED Notes (Signed)
 Called 2W to advise coming up

## 2023-10-13 NOTE — Consult Note (Signed)
 Cardiology Consultation   Patient ID: [name] MRN: [id number]; DOB: 03/18/1941  Admit date: 10/13/2023 Date of Consult: 10/13/2023  PCP:  Bertha Broad, MD   [facility] Providers Cardiologist:  Lauro Portal, MD  Electrophysiologist:  Boyce Byes, MD  { Click here to update MD or APP on Care Team, Refresh:1}     Patient Profile:   [name] is a 83 y.o. 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  History of Present Illness:    [name] is a 83 y.o. 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   ER evaluation EKG shows left bundle branch block discordant finding in V2 V3. Prior EKG 6/17/2024Shows she is AV paced rhythm troponin 1614 downtrending to 1306, BNP 580\ INR 4.4  Chest x-ray showed small to moderate right-sided pleural effusion CT angio done which is negative for PE but showed large soft tissue mass in subcarinal region with mass effect upon mid thoracic esophagus compatible with malignancy/adenopathy.  Dense consolidation of right lower lobe with volume loss, central obstructing neoplasm suspected, trace right pleural effusion, cardiomegaly with prominent LV  dilation, aortic atherosclerosis and emphysema  Prior cardiac evaluation Echo 02/2023 shows EF 25 to 30%, mildly dilated LV, normal RV size and function, mitral valve with mean gradient of 4 at heart rate 70 bpm, mechanical mitral valve in place, aortic valve sclerosis Left heart cath 10/08/2022 shows no mild nonobstructive CAD with 20% distal left main, 20% proximal and 50% mid LAD stenosis, 30% mid circumflex, RCA with 20 to 30% proximal to mid stenosis overall nonobstructive CAD, LVEDP 24 mmHg , Right heart catheter evaluated shows mildly elevated PA systolic 32, mean PA 23 mmHg overall elevated right sided filling pressures and mild pulmonary hypertension  Past Medical History:  Diagnosis Date   Allergy    seasonal   Anemia    Anxiety    Arthritis    Asthma    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 Protecta   Biventricular ICD (implantable cardioverter-defibrillator) in place    with CRT   Blood transfusion without reported diagnosis    Bursitis    Cataract    RIGHT EYE   CHF (congestive heart failure) (HCC)    Chronic kidney disease    Colon polyp    adenomatous   Complication of anesthesia    patient stated that had difficulty getting the breathing tube removed, patient said that she stopped breathing and HR dropped to 10  patient then woke up and started breathing pateint stated no longer than one minute; re-intubated in PACU following cholecystectomy 10/28/13   COPD (chronic obstructive pulmonary disease) (HCC)    Depression    Diverticulosis    Dysrhythmia    Fibromyalgia    GERD (gastroesophageal  reflux disease)    Gout    H/O mitral valve replacement 2002, 2007   Heart murmur    Hemorrhoids    Hyperlipidemia    Hypertension    Hypothyroidism    IBS (irritable bowel syndrome)    MVP (mitral valve prolapse)    NAUSEA AND VOMITING 08/01/2009   Neuromuscular disorder (HCC)    fibromyalgia   Pacemaker    Peptic ulcer disease      Past Surgical History:  Procedure Laterality Date   ABDOMINAL HYSTERECTOMY     BIV ICD GENERATOR CHANGEOUT N/A 04/29/2017   Procedure: BIV ICD GENERATOR CHANGEOUT;  Surgeon: Luana Rumple, MD;  Location: [facility] INVASIVE CV [facility];  Service: Cardiovascular;  Laterality: N/A;   CARDIAC CATHETERIZATION  02/25/2006   normal left main, normal LAD, normal L Cfx, normal/dominant RCA (Dr. Aleda Ammon)   CARDIAC DEFIBRILLATOR PLACEMENT  2007, 11/202012   x2 (pacemaker) (Dr. Melven Stable. Croitoru)   CARDIAC VALVE REPLACEMENT  2002   MV repair - Dr. Isla Mari   CHOLECYSTECTOMY N/A 10/28/2013   Procedure: LAPAROSCOPIC CHOLECYSTECTOMY WITH INTRAOPERATIVE CHOLANGIOGRAM;  Surgeon: Quitman Bucy, MD;  Location: [facility] OR;  Service: General;  Laterality: N/A;   COLONOSCOPY     ENTEROSCOPY N/A 11/09/2021   Procedure: ENTEROSCOPY;  Surgeon: Normie Becton., MD;  Location: [facility] ENDOSCOPY;  Service: Gastroenterology;  Laterality: N/A;   ESOPHAGOGASTRODUODENOSCOPY (EGD) WITH PROPOFOL  N/A 03/02/2021   Procedure: ESOPHAGOGASTRODUODENOSCOPY (EGD) WITH PROPOFOL ;  Surgeon: Elois Hair, MD;  Location: [facility] ENDOSCOPY;  Service: Gastroenterology;  Laterality: N/A;   EYE SURGERY     HEMOSTASIS CLIP PLACEMENT  03/02/2021   Procedure: HEMOSTASIS CLIP PLACEMENT;  Surgeon: Elois Hair, MD;  Location: [facility] ENDOSCOPY;  Service: Gastroenterology;;   HEMOSTASIS CLIP PLACEMENT  11/09/2021   Procedure: HEMOSTASIS CLIP PLACEMENT;  Surgeon: Normie Becton., MD;  Location: [facility] ENDOSCOPY;  Service: Gastroenterology;;   HOT HEMOSTASIS  03/02/2021   Procedure: HOT HEMOSTASIS (ARGON PLASMA COAGULATION/BICAP);  Surgeon: Elois Hair, MD;  Location: [facility] ENDOSCOPY;  Service: Gastroenterology;;   HOT HEMOSTASIS N/A 11/09/2021   Procedure: HOT HEMOSTASIS (ARGON PLASMA COAGULATION/BICAP);  Surgeon: Normie Becton., MD;  Location: [facility] ENDOSCOPY;  Service: Gastroenterology;  Laterality: N/A;   IMPLANTABLE CARDIOVERTER  DEFIBRILLATOR (ICD) GENERATOR CHANGE N/A 04/10/2011   Procedure: ICD GENERATOR CHANGE;  Surgeon: Luana Rumple, MD;  Location: [facility] CATH LAB;  Service: Cardiovascular;  Laterality: N/A;   INSERT / REPLACE / REMOVE PACEMAKER     KNEE ARTHROSCOPY     MITRAL VALVE REPLACEMENT  02/26/2006   re-do MVR w/37mm St. [name] (Dr. Cherylynn Cosier)   NM MYOCAR PERF WALL MOTION  2005   persantine myoview - low ris, EF 63%   RIGHT HEART CATH  04/03/2006   pulm cap wedge pressure 24/24, PA pressure 43/22 (mean ), CO 4.8, CI 4.1 (Dr. Jammie Mccune)   RIGHT/LEFT HEART CATH AND CORONARY ANGIOGRAPHY N/A 10/08/2022   Procedure: RIGHT/LEFT HEART CATH AND CORONARY ANGIOGRAPHY;  Surgeon: Millicent Ally, MD;  Location: [facility] INVAS[facility] CV LAB;  Service: Cardiovascular;  Laterality: N/A;   SCLEROTHERAPY  03/02/2021   Procedure: SCLEROTHERAPY;  Surgeon: Elois Hair, MD;  Location: [facility] ENDOSCOPY;  Service: Gastroenterology;;   SUBMUCOSAL TATTOO INJECTION  11/09/2021   Procedure: SUBMUCOSAL TATTOO INJECTION;  Surgeon: Normie Becton., MD;  Location: [facility] ENDOSCOPY;  Service: Gastroenterology;;   TOTAL SHOULDER ARTHROPLASTY Right 12/27/2015   Procedure: RIGHT TOTAL SHOULDER ARTHROPLASTY;  Surgeon: Sammye Cristal, MD;  Location: [facility] OR;  Service:  Orthopedics;  Laterality: Right;  Right total shoulder arthroplasty   TOTAL SHOULDER ARTHROPLASTY Left 10/27/2019   Procedure: REVERSE TOTAL SHOULDER ARTHROPLASTY;  Surgeon: Sammye Cristal, MD;  Location: [facility] ORS;  Service: Orthopedics;  Laterality: Left;   TRANSTHORACIC ECHOCARDIOGRAM  12/2011   EF 50-55%, mild global hypokinesis; LA severely dilated; calcification of anterior/posterior MV leaflets, bi-leaflets St. [name] mechanical MV; mild TR; trace AV regurg/pulm valve regurg       Inpatient Medications: Scheduled Meds:  Continuous Infusions:  sodium chloride      piperacillin-tazobactam     vancomycin     PRN Meds:   Allergies:    Allergies  Allergen Reactions   Ace  Inhibitors Cough   Lipitor [Atorvastatin Calcium ] Other (See Comments)    Knee pain    Megace [Megestrol] Other (See Comments)    High blood pressure and blurred vision   Aspirin  Other (See Comments)    Nephrologist said to not take this Update 10/07/22 - patient clarifies this recommendation was lumped in with a whole bunch of other medications at that time many years ago when nephrologist told her to stay away from Tylenol , vitamins, ASA. Denies prior allergy or allergic reaction to this   Clorazepate Dipotassium Other (See Comments)    Interacts with a drug being taken   Simvastatin Other (See Comments)    Muscle pain   Tiotropium Bromide Monohydrate Other (See Comments)    Dry mouth   Codeine Nausea And Vomiting   Tape Rash    Social History:   Social History   Socioeconomic History   Marital status: Married    Spouse name: [name]   Number of children: 4   Years of education: 12   Highest education level: Not on file  Occupational History   Occupation: retired  Tobacco Use   Smoking status: Former    Current packs/day: 0.00    Types: Cigarettes    Quit date: 06/02/2001    Years since quitting: 22.3   Smokeless tobacco: Never  Vaping Use   Vaping status: Never Used  Substance and Sexual Activity   Alcohol use: Yes    Alcohol/week: 0.0 standard drinks of alcohol    Comment: social-1 every 6 months   Drug use: No   Sexual activity: Not on file  Other Topics Concern   Not on file  Social History Narrative   Drinks very little caffeine    Social Drivers of Health   Financial Resource Strain: Low Risk  (10/09/2022)   Overall Financial Resource Strain (CARDIA)    Difficulty of Paying Living Expenses: Not hard at all  Food Insecurity: Low Risk  (09/30/2023)   Received from [facility]   Hunger Vital Sign    Worried About Running Out of Food in the Last Year: Never true    Ran Out of Food in the Last Year: Never true  Transportation Needs: No Transportation Needs  (09/30/2023)   Received from [employer]    In the past 12 months, has lack of reliable transportation kept you from medical appointments, meetings, work or from getting things needed for daily living? : No  Physical Activity: Not on file  Stress: Not on file  Social Connections: Not on file  Intimate Partner Violence: Not At Risk (10/03/2022)   Humiliation, Afraid, Rape, and Kick questionnaire    Fear of Current or Ex-Partner: No    Emotionally Abused: No    Physically Abused: No    Sexually Abused: No  Family History:    Family History  Problem Relation Age of Onset   Breast cancer Sister        multiple   Uterine cancer Sister        multiple   Heart disease Mother    Heart attack Mother    Heart disease Father    Kidney disease Father    Heart attack Father    Heart disease Sister    Crohn's disease Sister    Heart disease Son    Colon cancer Neg Hx    Stomach cancer Neg Hx      ROS:  Please see the history of present illness.   All other ROS reviewed and negative.     Physical Exam/Data:   Vitals:   10/13/23 1512 10/13/23 1856 10/13/23 1930  BP: (!) 155/66 (!) 158/70 (!) 157/63  Pulse: 86 94 100  Resp: (!) 28 19 18   Temp: 97.9 F (36.6 C) 97.8 F (36.6 C)   TempSrc:  Oral   SpO2: 98% 98% 98%   No intake or output data in the 24 hours ending 10/13/23 2020    06/10/2023    2:07 PM 05/25/2023    1:27 PM 02/10/2023   10:32 AM  Last 3 Weights  Weight (lbs) 93 lb 9.6 oz 92 lb 96 lb 3.2 oz  Weight (kg) 42.457 kg 41.731 kg 43.636 kg     There is no height or weight on file to calculate BMI.  General:  Well nourished, well developed, in no acute distress HEENT: normal Neck: no JVD Vascular: No carotid bruits; Distal pulses 2+ bilaterally Cardiac:  normal S1, S2; RRR; no murmur  Lungs: Diffuse wheezing Abd: soft, nontender, no hepatomegaly  Ext: no edema Musculoskeletal:  No deformities, BUE and BLE strength normal and equal Skin: warm  and dry  Neuro:  CNs 2-12 intact, no focal abnormalities noted Psych:  Normal affect     Laboratory Data:  High Sensitivity Troponin:   Recent Labs  Lab 10/13/23 1755  TROPONINIHS 1,614*     Chemistry Recent Labs  Lab 10/13/23 1513  NA 134*  K 3.7  CL 98  CO2 26  GLUCOSE 136*  BUN 16  CREATININE 1.00  CALCIUM  10.2  GFRNONAA 57*  ANIONGAP 10    No results for input(s): "PROT", "ALBUMIN", "AST", "ALT", "ALKPHOS", "BILITOT" in the last 168 hours. Lipids No results for input(s): "CHOL", "TRIG", "HDL", "LABVLDL", "LDLCALC", "CHOLHDL" in the last 168 hours.  Hematology Recent Labs  Lab 10/13/23 1513  WBC 16.8*  RBC 3.43*  HGB 10.5*  HCT 33.2*  MCV 96.8  MCH 30.6  MCHC 31.6  RDW 14.4  PLT 340   Thyroid  Recent Labs  Lab 10/13/23 1755  TSH 5.610*    BNP Recent Labs  Lab 10/13/23 1755  BNP 580.4*    DDimer No results for input(s): "DDIMER" in the last 168 hours.   Radiology/Studies:  CT Soft Tissue Neck W Contrast Result Date: 10/13/2023 CLINICAL DATA:  Stridor and chest pain EXAM: CT NECK WITH CONTRAST TECHNIQUE: Multidetector CT imaging of the neck was performed using the standard protocol following the bolus administration of intravenous contrast. RADIATION DOSE REDUCTION: This exam was performed according to the departmental dose-optimization program which includes automated exposure control, adjustment of the mA and/or kV according to patient size and/or use of iterative reconstruction technique. CONTRAST:  75mL OMNIPAQUE  IOHEXOL  350 MG/ML SOLN COMPARISON:  None Available. FINDINGS: Pharynx and larynx: Normal. No mass or swelling.  Salivary glands: No inflammation, mass, or stone. Thyroid: Normal Lymph nodes: None enlarged or abnormal density. Vascular: Numerous small caliber collaterals surrounding the spinal column. Major vessels are patent. Calcific aortic atherosclerosis. Limited intracranial: Normal Visualized orbits: Normal Mastoids and visualized  paranasal sinuses: Clear Skeleton: Negative Upper chest: Biapical emphysema. Other: Patulous debris containing lower cervical and upper thoracic esophagus. IMPRESSION: 1. Patulous debris containing lower cervical and upper thoracic esophagus. 2. No acute abnormality of the neck. Aortic Atherosclerosis (ICD10-I70.0) and Emphysema (ICD10-J43.9). Electronically Signed   By: Juanetta Nordmann M.D.   On: 10/13/2023 19:45   CT Angio Chest PE W and/or Wo Contrast Result Date: 10/13/2023 CLINICAL DATA:  Chest discomfort, difficulty breathing, stridor, throat tightness EXAM: CT ANGIOGRAPHY CHEST WITH CONTRAST TECHNIQUE: Multidetector CT imaging of the chest was performed using the standard protocol during bolus administration of intravenous contrast. Multiplanar CT image reconstructions and MIPs were obtained to evaluate the vascular anatomy. RADIATION DOSE REDUCTION: This exam was performed according to the departmental dose-optimization program which includes automated exposure control, adjustment of the mA and/or kV according to patient size and/or use of iterative reconstruction technique. CONTRAST:  75mL OMNIPAQUE  IOHEXOL  350 MG/ML SOLN COMPARISON:  10/13/2023, 06/22/2022 FINDINGS: Cardiovascular: This is a technically adequate evaluation of the pulmonary vasculature. No filling defects or pulmonary emboli. The heart is enlarged, with marked left ventricular dilatation. Multi lead cardiac pacer again identified. No evidence of thoracic aortic aneurysm or dissection. Atherosclerosis throughout the aorta and coronary vasculature. 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 Mediastinum/Nodes: Subcarinal soft tissue mass measures up to 3.2 x 5.6 cm, reference image 63/4. This has mass effect upon the midthoracic esophagus, which is deviated to the left. This results in proximal esophageal  dilatation and retained secretions. The trachea appears unremarkable. Thyroid is not well visualized. Lungs/Pleura: There is complete consolidation with volume loss in the right lower lobe. There is opacification of the bronchus intermedius and right lower lobe bronchus. Central obstructing neoplasm cannot be excluded. Upper lobe predominant emphysema. Trace right pleural effusion. No pneumothorax. Upper Abdomen: Bilateral adrenal nodules, measuring 14 mm on the right and 10 mm on the left. No other acute upper abdominal findings. Musculoskeletal: No acute or destructive bony abnormalities. Bilateral shoulder arthroplasties. Reconstructed images demonstrate no additional findings. Review of the MIP images confirms the above findings. IMPRESSION: 1. Large soft tissue mass in the subcarinal region, with mass effect upon the midthoracic esophagus. Findings compatible with malignancy or adenopathy. 2. Dense consolidation of the right lower lobe with volume loss, as well as opacification of the bronchus intermedius and right lower lobe bronchial tree. Central obstructing neoplasm is suspected. Bronchoscopy may be useful. 3. Trace right pleural effusion. 4. No evidence of pulmonary embolus. 5. Nonspecific bilateral adrenal nodularity, metastatic disease not excluded in light of intrathoracic findings. 6. Cardiomegaly with prominent left ventricular dilatation. 7. Aortic Atherosclerosis (ICD10-I70.0) and Emphysema (ICD10-J43.9). Electronically Signed   By: Bobbye Burrow M.D.   On: 10/13/2023 18:51   DG Chest 2 View Result Date: 10/13/2023 CLINICAL DATA:  Shortness of breath.  Wheezing. EXAM: CHEST - 2 VIEW COMPARISON:  Chest radiographs 10/03/2022, 06/20/2022, 11/18/2021 FINDINGS: Status post median sternotomy and mitral valve prosthesis, unchanged. Mild high-grade atherosclerotic calcifications within the aortic arch. Left chest wall AICD with leads overlying the right atrium, right antrum ventricle, and coronary  sinus. Cardiac silhouette is again at the upper limits of  normal size. Mediastinal contours are within normal limits. Mild bilateral interstitial thickening is actually improved from 10/03/2022 and 06/20/2022 and may represent the patient's normal baseline, mild chronic fibrosis. Small to moderate right pleural effusion and associated right basilar heterogeneous airspace opacification appears to be new from prior. No pneumothorax. Status post reverse total left shoulder arthroplasty and standard total right shoulder arthroplasty. IMPRESSION: 1. Small to moderate right pleural effusion and associated right basilar heterogeneous airspace opacification appears to be new from prior. This may represent atelectasis or pneumonia. 2. Mild bilateral interstitial thickening is actually improved from 10/03/2022 and 06/20/2022 and may represent the patient's normal baseline, mild chronic fibrosis. Electronically Signed   By: Bertina Broccoli M.D.   On: 10/13/2023 16:32     Assessment and Plan:   Elevated trop- likely demand Ischemia, has LBBB with discordance New likely Cancer   Risk Assessment/Risk Scores:  {Complete the following score calculators/questions to meet required metrics.  Press F2         :[id number]}      [place]  Heart Association (NYHA) Functional Class NYHA Class III        For questions or updates, please contact [facility] Please consult [email or web address] for contact info under    Signed, Cranston Dk, MD  10/13/2023 8:20 PM

## 2023-10-13 NOTE — Patient Instructions (Signed)
 Do not take any warfarin today and tomorrow then continue 1 tablet daily except 1/2 tablet on Sundays and Fridays.  Drink Boost/Ensure weekly.  Recheck INR in 3 weeks. Coumadin  Clinic [phone]. Stay consistent with 1-2 helping of greens per week. Please call if you start ANY new medication.

## 2023-10-13 NOTE — ED Triage Notes (Signed)
 Pt here with shob since January. Seen for same and dx with bronchitis. Audible wheezing noted during triage. Using albuterol  inhaler with minimal relief.

## 2023-10-13 NOTE — H&P (Signed)
 History and Physical    Patient: [name] ZOX:[id number] DOB: [birth year] DOA: 10/13/2023 DOS: the patient was seen and examined on 10/14/2023 PCP: Bertha Broad, MD  Patient coming from: Home  Chief Complaint:  Chief Complaint  Patient presents with   Shortness of Breath   HPI: [name] is a 83 y.o. 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  She has been having chest pain with swallowing, globus sensation in her throat, shortness of breath, and coughing fits since January.  After this particular episode her husband urged her to come to the emergency department given no relief with prior interventions.  In the emergency department, she was hemodynamically stable saturating appropriately on room air.  Workup significant for initial troponin (high-sensitivity) greater than 1600 without acute ischemic changes on EKG, leukocytosis, INR 4.4 with a stable chronic anemia.she denies any GI bleeding.  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  ED provider spoke with cardiology who recommended an echo and trending troponin, advised against heparin  drip in this setting.  He also discussed with pulmonology who recommended admission for formal consult in the morning. Meanwhile, she was started on broad-spectrum antibiotics for potential postobstructive pneumonia.  Patient is open to invasive procedures and confirmed she is a full code.   Hospitalist consulted for  admission for further evaluation.    Review of Systems: Review of Systems  Constitutional:  Positive for weight loss. Negative for chills, fever and malaise/fatigue.  Eyes:  Negative for blurred vision.  Respiratory:  Positive for cough, hemoptysis, shortness of breath and wheezing. Negative for sputum production.   Cardiovascular:  Positive for chest pain. Negative for leg swelling.  Gastrointestinal:  Positive for heartburn. Negative for abdominal pain, blood in stool, constipation, diarrhea and vomiting.  Genitourinary:  Negative for dysuria and urgency.  Musculoskeletal:  Negative for falls, joint pain and myalgias.  Skin:  Negative for itching and rash.  Neurological:  Negative for dizziness.  Psychiatric/Behavioral:  Negative for suicidal ideas.     Past Medical History:  Diagnosis Date   Allergy    seasonal   Anemia    Anxiety    Arthritis    Asthma    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 Protecta   Biventricular ICD (implantable cardioverter-defibrillator) in place    with CRT   Blood transfusion without reported diagnosis    Bursitis    Cataract    RIGHT EYE   CHF (congestive heart failure) (HCC)    Chronic kidney disease    Colon polyp    adenomatous   Complication of anesthesia    patient stated that had difficulty getting the breathing tube removed, patient said that she stopped breathing and HR dropped to 10  patient then woke up and started breathing pateint stated no longer than one minute; re-intubated in PACU following cholecystectomy 10/28/13   COPD (chronic obstructive pulmonary disease) (HCC)    Depression    Diverticulosis    Dysrhythmia    Fibromyalgia    GERD (gastroesophageal reflux disease)    Gout  H/O mitral valve replacement 2002, 2007   Heart murmur    Hemorrhoids    Hyperlipidemia    Hypertension    Hypothyroidism    IBS (irritable bowel syndrome)    MVP (mitral valve prolapse)    NAUSEA AND VOMITING  08/01/2009   Neuromuscular disorder (HCC)    fibromyalgia   Pacemaker    Peptic ulcer disease    Past Surgical History:  Procedure Laterality Date   ABDOMINAL HYSTERECTOMY     BIV ICD GENERATOR CHANGEOUT N/A 04/29/2017   Procedure: BIV ICD GENERATOR CHANGEOUT;  Surgeon: Luana Rumple, MD;  Location: [facility];  Service: Cardiovascular;  Laterality: N/A;   CARDIAC CATHETERIZATION  02/25/2006   normal left main, normal LAD, normal L Cfx, normal/dominant RCA (Dr. Aleda Ammon)   CARDIAC DEFIBRILLATOR PLACEMENT  2007, 11/202012   x2 (pacemaker) (Dr. Melven Stable. Croitoru)   CARDIAC VALVE REPLACEMENT  2002   MV repair - Dr. Isla Mari   CHOLECYSTECTOMY N/A 10/28/2013   Procedure: LAPAROSCOPIC CHOLECYSTECTOMY WITH INTRAOPERATIVE CHOLANGIOGRAM;  Surgeon: Quitman Bucy, MD;  Location: [facility] OR;  Service: General;  Laterality: N/A;   COLONOSCOPY     ENTEROSCOPY N/A 11/09/2021   Procedure: ENTEROSCOPY;  Surgeon: Normie Becton., MD;  Location: [facility] ENDOSCOPY;  Service: Gastroenterology;  Laterality: N/A;   ESOPHAGOGASTRODUODENOSCOPY (EGD) WITH PROPOFOL  N/A 03/02/2021   Procedure: ESOPHAGOGASTRODUODENOSCOPY (EGD) WITH PROPOFOL ;  Surgeon: Elois Hair, MD;  Location: [facility] ENDOSCOPY;  Service: Gastroenterology;  Laterality: N/A;   EYE SURGERY     HEMOSTASIS CLIP PLACEMENT  03/02/2021   Procedure: HEMOSTASIS CLIP PLACEMENT;  Surgeon: Elois Hair, MD;  Location: [facility] ENDOSCOPY;  Service: Gastroenterology;;   HEMOSTASIS CLIP PLACEMENT  11/09/2021   Procedure: HEMOSTASIS CLIP PLACEMENT;  Surgeon: Normie Becton., MD;  Location: [facility] ENDOSCOPY;  Service: Gastroenterology;;   HOT HEMOSTASIS  03/02/2021   Procedure: HOT HEMOSTASIS (ARGON PLASMA COAGULATION/BICAP);  Surgeon: Elois Hair, MD;  Location: [facility] ENDOSCOPY;  Service: Gastroenterology;;   HOT HEMOSTASIS N/A 11/09/2021   Procedure: HOT HEMOSTASIS (ARGON PLASMA COAGULATION/BICAP);  Surgeon: Normie Becton., MD;   Location: [facility] ENDOSCOPY;  Service: Gastroenterology;  Laterality: N/A;   IMPLANTABLE CARDIOVERTER DEFIBRILLATOR (ICD) GENERATOR CHANGE N/A 04/10/2011   Procedure: ICD GENERATOR CHANGE;  Surgeon: Luana Rumple, MD;  Location: [facility] CATH LAB;  Service: Cardiovascular;  Laterality: N/A;   INSERT / REPLACE / REMOVE PACEMAKER     KNEE ARTHROSCOPY     MITRAL VALVE REPLACEMENT  02/26/2006   re-do MVR w/67mm St. [name] (Dr. Cherylynn Cosier)   NM MYOCAR PERF WALL MOTION  2005   persantine myoview - low ris, EF 63%   RIGHT HEART CATH  04/03/2006   pulm cap wedge pressure 24/24, PA pressure 43/22 (mean ), CO 4.8, CI 4.1 (Dr. Jammie Mccune)   RIGHT/LEFT HEART CATH AND CORONARY ANGIOGRAPHY N/A 10/08/2022   Procedure: RIGHT/LEFT HEART CATH AND CORONARY ANGIOGRAPHY;  Surgeon: Millicent Ally, MD;  Location: [facility];  Service: Cardiovascular;  Laterality: N/A;   SCLEROTHERAPY  03/02/2021   Procedure: SCLEROTHERAPY;  Surgeon: Elois Hair, MD;  Location: [facility] ENDOSCOPY;  Service: Gastroenterology;;   SUBMUCOSAL TATTOO INJECTION  11/09/2021   Procedure: SUBMUCOSAL TATTOO INJECTION;  Surgeon: Normie Becton., MD;  Location: [facility] ENDOSCOPY;  Service: Gastroenterology;;   TOTAL SHOULDER ARTHROPLASTY Right 12/27/2015   Procedure: RIGHT TOTAL SHOULDER ARTHROPLASTY;  Surgeon: Sammye Cristal, MD;  Location: [facility] OR;  Service: Orthopedics;  Laterality: Right;  Right total shoulder arthroplasty  TOTAL SHOULDER ARTHROPLASTY Left 10/27/2019   Procedure: REVERSE TOTAL SHOULDER ARTHROPLASTY;  Surgeon: Sammye Cristal, MD;  Location: [facility] ORS;  Service: Orthopedics;  Laterality: Left;   TRANSTHORACIC ECHOCARDIOGRAM  12/2011   EF 50-55%, mild global hypokinesis; LA severely dilated; calcification of anterior/posterior MV leaflets, bi-leaflets St. [name] mechanical MV; mild TR; trace AV regurg/pulm valve regurg   Social History:  reports that she quit smoking about 22 years ago. Her smoking use included cigarettes. She has  never used smokeless tobacco. She reports current alcohol use. She reports that she does not use drugs.  Allergies  Allergen Reactions   Ace Inhibitors Cough   Lipitor [Atorvastatin Calcium ] Other (See Comments)    Knee pain    Megace [Megestrol] Other (See Comments)    High blood pressure and blurred vision   Aspirin  Other (See Comments)    Nephrologist said to not take this Update 10/07/22 - patient clarifies this recommendation was lumped in with a whole bunch of other medications at that time many years ago when nephrologist told her to stay away from Tylenol , vitamins, ASA. Denies prior allergy or allergic reaction to this   Clorazepate Dipotassium Other (See Comments)    Interacts with a drug being taken   Simvastatin Other (See Comments)    Muscle pain   Tiotropium Bromide Monohydrate Other (See Comments)    Dry mouth   Codeine Nausea And Vomiting   Tape Rash    Family History  Problem Relation Age of Onset   Breast cancer Sister        multiple   Uterine cancer Sister        multiple   Heart disease Mother    Heart attack Mother    Heart disease Father    Kidney disease Father    Heart attack Father    Heart disease Sister    Crohn's disease Sister    Heart disease Son    Colon cancer Neg Hx    Stomach cancer Neg Hx     Prior to Admission medications   Medication Sig Start Date End Date Taking? Authorizing Provider  acetaminophen  (TYLENOL ) 650 MG CR tablet Take 650 mg by mouth every 8 (eight) hours as needed for pain.   Yes [provider]  allopurinol  (ZYLOPRIM ) 300 MG tablet Take 300 mg by mouth every evening.    Yes [provider]  ALPRAZolam  (XANAX ) 0.25 MG tablet Take 0.25 mg by mouth at bedtime as needed for anxiety or sleep.    Yes [provider]  antiseptic oral rinse (BIOTENE) LIQD 15 mLs by Mouth Rinse route as needed for dry mouth.   Yes [provider]  buPROPion  (WELLBUTRIN  SR) 150 MG 12 hr tablet Take 150 mg by  mouth in the morning.   Yes [provider]  esomeprazole  (NEXIUM ) 40 MG capsule TAKE 1 CAPSULE BY MOUTH EVERY DAY BEFORE BREAKFAST 02/09/23  Yes Esterwood, Amy S, PA-C  furosemide  (LASIX ) 20 MG tablet Take 20 mg by mouth 2 (two) times daily. 09/18/23  Yes [provider]  icosapent  Ethyl (VASCEPA ) 1 g capsule Take 1 capsule (1 g total) by mouth 2 (two) times daily. 09/03/22  Yes Hilty, Aviva Lemmings, MD  ipratropium (ATROVENT ) 0.06 % nasal spray Place 2 sprays into both nostrils daily as needed for rhinitis. 07/07/22  Yes [provider]  levothyroxine  (SYNTHROID ) 75 MCG tablet Take 75 mcg by mouth daily before breakfast.   Yes [provider]  metoCLOPramide  (REGLAN ) 5 MG  tablet Take 5 mg by mouth 2 (two) times daily as needed for nausea or vomiting. 11/05/21  Yes [provider]  metoprolol  succinate (TOPROL -XL) 50 MG 24 hr tablet Take 0.5 tablets (25 mg total) by mouth every morning AND 1 tablet (50 mg total) every evening. Take with or immediately following a meal.. 11/17/22  Yes Croitoru, Mihai, MD  Multiple Vitamin (MULTIVITAMIN WITH MINERALS) TABS tablet Take 1 tablet by mouth daily.   Yes [provider]  pravastatin  (PRAVACHOL ) 40 MG tablet Take 40 mg at bedtime by mouth.    Yes [provider]  PROAIR  HFA 108 (90 BASE) MCG/ACT inhaler Inhale 1-2 puffs into the lungs every 6 (six) hours as needed for wheezing. 01/23/15  Yes [provider]  spironolactone  (ALDACTONE ) 25 MG tablet Take 1 tablet (25 mg total) by mouth daily. 10/31/22  Yes Ruddy Corral M, PA-C  traMADol  (ULTRAM ) 50 MG tablet Take 1 tablet (50 mg total) by mouth every 8 (eight) hours as needed for moderate pain or severe pain (1 tablet every 8 hours as needed for pain). 02/10/23  Yes Croitoru, Mihai, MD  traZODone  (DESYREL ) 50 MG tablet Take 25 mg by mouth at bedtime as needed. 02/04/23  Yes [provider]  TRELEGY ELLIPTA 100-62.5-25 MCG/ACT AEPB Inhale 1 puff  into the lungs daily. 10/02/23  Yes [provider]  valsartan  (DIOVAN ) 160 MG tablet Take 1 tablet (160 mg total) by mouth daily. 02/10/23  Yes Croitoru, Mihai, MD  warfarin (COUMADIN ) 2.5 MG tablet TAKE 1 TO 2 TABLETS BY MOUTH DAILY AS DIRECTED BY THE COUMADIN  CLINIC Patient taking differently: Take 1.25-2.5 mg by mouth See admin instructions. Take 1.25mg  by mouth every Friday and Sunday. Take 2.5mg  by mouth all other days of the week. 02/03/23  Yes Avanell Leigh, MD  ferrous sulfate  325 (65 FE) MG tablet Take 1 tablet (325 mg total) by mouth daily with breakfast. Patient not taking: Reported on 10/13/2023 10/15/22   Deforest Fast, MD  furosemide  (LASIX ) 40 MG tablet Take 1 tablet (40 mg total) by mouth daily. Patient not taking: Reported on 10/13/2023 11/17/22   Croitoru, Mihai, MD  Sennosides (SENNA) 15 MG TABS Take 30 mg by mouth at bedtime. Patient not taking: Reported on 10/13/2023    [provider]    Physical Exam: Vitals:   10/13/23 1856 10/13/23 1930 10/13/23 2145 10/13/23 2246  BP: (!) 158/70 (!) 157/63 (!) 151/64 (!) 148/61  Pulse: 94 100 95 94  Resp: 19 18 20 18   Temp: 97.8 F (36.6 C)   98.6 F (37 C)  TempSrc: Oral   Oral  SpO2: 98% 98% 97% 100%   Physical Exam Vitals reviewed.  Constitutional:      General: She is not in acute distress.    Appearance: She is ill-appearing (chornically). She is not toxic-appearing.     Comments: Thin   HENT:     Head: Normocephalic and atraumatic.  Eyes:     Extraocular Movements: Extraocular movements intact.     Pupils: Pupils are equal, round, and reactive to light.  Cardiovascular:     Rate and Rhythm: Normal rate and regular rhythm.  Pulmonary:     Effort: Pulmonary effort is normal. No respiratory distress.  Chest:     Chest wall: No tenderness or edema.  Abdominal:     Palpations: Abdomen is soft. There is no mass.  Musculoskeletal:        General: Normal range of motion.  Cervical back: Normal  range of motion and neck supple.     Right lower leg: No tenderness. No edema.     Left lower leg: No tenderness. No edema.  Skin:    General: Skin is warm.     Capillary Refill: Capillary refill takes less than 2 seconds.  Neurological:     General: No focal deficit present.     Mental Status: She is alert and oriented to person, place, and time.  Psychiatric:        Mood and Affect: Mood normal.        Behavior: Behavior normal.      Data Reviewed:    Labs on Admission: I have personally reviewed following labs and imaging studies  CBC: Recent Labs  Lab 10/13/23 1513  WBC 16.8*  HGB 10.5*  HCT 33.2*  MCV 96.8  PLT 340   Basic Metabolic Panel: Recent Labs  Lab 10/13/23 1513  NA 134*  K 3.7  CL 98  CO2 26  GLUCOSE 136*  BUN 16  CREATININE 1.00  CALCIUM  10.2   GFR: CrCl cannot be calculated (Unknown ideal weight.). Liver Function Tests: No results for input(s): "AST", "ALT", "ALKPHOS", "BILITOT", "PROT", "ALBUMIN" in the last 168 hours. No results for input(s): "LIPASE", "AMYLASE" in the last 168 hours. No results for input(s): "AMMONIA" in the last 168 hours. Coagulation Profile: Recent Labs  Lab 10/13/23 1447  INR 4.4*   Cardiac Enzymes: No results for input(s): "CKTOTAL", "CKMB", "CKMBINDEX", "TROPONINI" in the last 168 hours. BNP (last 3 results) No results for input(s): "PROBNP" in the last 8760 hours. HbA1C: No results for input(s): "HGBA1C" in the last 72 hours. CBG: No results for input(s): "GLUCAP" in the last 168 hours. Lipid Profile: No results for input(s): "CHOL", "HDL", "LDLCALC", "TRIG", "CHOLHDL", "LDLDIRECT" in the last 72 hours. Thyroid Function Tests: Recent Labs    10/13/23 1755  TSH 5.610*   Anemia Panel: No results for input(s): "VITAMINB12", "FOLATE", "FERRITIN", "TIBC", "IRON ", "RETICCTPCT" in the last 72 hours. Urine analysis:    Component Value Date/Time   COLORURINE STRAW (A) 10/04/2022 0603   APPEARANCEUR CLEAR  10/04/2022 0603   LABSPEC 1.006 10/04/2022 0603   PHURINE 5.0 10/04/2022 0603   GLUCOSEU NEGATIVE 10/04/2022 0603   HGBUR MODERATE (A) 10/04/2022 0603   BILIRUBINUR NEGATIVE 10/04/2022 0603   KETONESUR NEGATIVE 10/04/2022 0603   PROTEINUR 100 (A) 10/04/2022 0603   NITRITE NEGATIVE 10/04/2022 0603   LEUKOCYTESUR NEGATIVE 10/04/2022 0603    Radiological Exams on Admission: CT Soft Tissue Neck W Contrast Result Date: 10/13/2023 CLINICAL DATA:  Stridor and chest pain EXAM: CT NECK WITH CONTRAST TECHNIQUE: Multidetector CT imaging of the neck was performed using the standard protocol following the bolus administration of intravenous contrast. RADIATION DOSE REDUCTION: This exam was performed according to the departmental dose-optimization program which includes automated exposure control, adjustment of the mA and/or kV according to patient size and/or use of iterative reconstruction technique. CONTRAST:  75mL OMNIPAQUE  IOHEXOL  350 MG/ML SOLN COMPARISON:  None Available. FINDINGS: Pharynx and larynx: Normal. No mass or swelling. Salivary glands: No inflammation, mass, or stone. Thyroid: Normal Lymph nodes: None enlarged or abnormal density. Vascular: Numerous small caliber collaterals surrounding the spinal column. Major vessels are patent. Calcific aortic atherosclerosis. Limited intracranial: Normal Visualized orbits: Normal Mastoids and visualized paranasal sinuses: Clear Skeleton: Negative Upper chest: Biapical emphysema. Other: Patulous debris containing lower cervical and upper thoracic esophagus. IMPRESSION: 1. Patulous debris containing lower cervical and upper thoracic esophagus. 2.  No acute abnormality of the neck. Aortic Atherosclerosis (ICD10-I70.0) and Emphysema (ICD10-J43.9). Electronically Signed   By: Juanetta Nordmann M.D.   On: 10/13/2023 19:45   CT Angio Chest PE W and/or Wo Contrast Result Date: 10/13/2023 CLINICAL DATA:  Chest discomfort, difficulty breathing, stridor, throat tightness  EXAM: CT ANGIOGRAPHY CHEST WITH CONTRAST TECHNIQUE: Multidetector CT imaging of the chest was performed using the standard protocol during bolus administration of intravenous contrast. Multiplanar CT image reconstructions and MIPs were obtained to evaluate the vascular anatomy. RADIATION DOSE REDUCTION: This exam was performed according to the departmental dose-optimization program which includes automated exposure control, adjustment of the mA and/or kV according to patient size and/or use of iterative reconstruction technique. CONTRAST:  75mL OMNIPAQUE  IOHEXOL  350 MG/ML SOLN COMPARISON:  10/13/2023, 06/22/2022 FINDINGS: Cardiovascular: This is a technically adequate evaluation of the pulmonary vasculature. No filling defects or pulmonary emboli. The heart is enlarged, with marked left ventricular dilatation. Multi lead cardiac pacer again identified. No evidence of thoracic aortic aneurysm or dissection. Atherosclerosis throughout the aorta and coronary vasculature. 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 Mediastinum/Nodes: Subcarinal soft tissue mass measures up to 3.2 x 5.6 cm, reference image 63/4. This has mass effect upon the midthoracic esophagus, which is deviated to the left. This results in proximal esophageal dilatation and retained secretions. The trachea appears unremarkable. Thyroid is not well visualized. Lungs/Pleura: There is complete consolidation with volume loss in the right lower lobe. There is opacification of the bronchus intermedius and right lower lobe bronchus. Central obstructing neoplasm cannot be excluded. Upper lobe predominant emphysema. Trace right pleural effusion. No pneumothorax. Upper Abdomen: Bilateral adrenal nodules, measuring 14 mm on the right and 10 mm on the left. No other acute upper abdominal findings. Musculoskeletal: No acute or  destructive bony abnormalities. Bilateral shoulder arthroplasties. Reconstructed images demonstrate no additional findings. Review of the MIP images confirms the above findings. IMPRESSION: 1. Large soft tissue mass in the subcarinal region, with mass effect upon the midthoracic esophagus. Findings compatible with malignancy or adenopathy. 2. Dense consolidation of the right lower lobe with volume loss, as well as opacification of the bronchus intermedius and right lower lobe bronchial tree. Central obstructing neoplasm is suspected. Bronchoscopy may be useful. 3. Trace right pleural effusion. 4. No evidence of pulmonary embolus. 5. Nonspecific bilateral adrenal nodularity, metastatic disease not excluded in light of intrathoracic findings. 6. Cardiomegaly with prominent left ventricular dilatation. 7. Aortic Atherosclerosis (ICD10-I70.0) and Emphysema (ICD10-J43.9). Electronically Signed   By: Bobbye Burrow M.D.   On: 10/13/2023 18:51   DG Chest 2 View Result Date: 10/13/2023 CLINICAL DATA:  Shortness of breath.  Wheezing. EXAM: CHEST - 2 VIEW COMPARISON:  Chest radiographs 10/03/2022, 06/20/2022, 11/18/2021 FINDINGS: Status post median sternotomy and mitral valve prosthesis, unchanged. Mild high-grade atherosclerotic calcifications within the aortic arch. Left chest wall AICD with leads overlying the right atrium, right antrum ventricle, and coronary sinus. Cardiac silhouette is again at the upper limits of normal size. Mediastinal contours are within normal limits. Mild bilateral interstitial thickening is actually improved from 10/03/2022 and 06/20/2022 and may represent the patient's normal baseline, mild chronic fibrosis. Small to moderate right pleural effusion and associated right basilar heterogeneous airspace opacification appears to be new from prior. No pneumothorax. Status post reverse total left shoulder arthroplasty and standard total right shoulder arthroplasty. IMPRESSION: 1. Small to moderate  right pleural effusion and  associated right basilar heterogeneous airspace opacification appears to be new from prior. This may represent atelectasis or pneumonia. 2. Mild bilateral interstitial thickening is actually improved from 10/03/2022 and 06/20/2022 and may represent the patient's normal baseline, mild chronic fibrosis. Electronically Signed   By: Bertina Broccoli M.D.   On: 10/13/2023 16:32       Assessment and Plan: No notes have been filed under this hospital service. Service: Hospitalist  83 y.o. 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  Admitted for further evaluation/management  Large intrathoracic soft tissue mass Suspected obstructing neoplasm in right lower lobe ?  Postobstructive pneumonia - Empirically treated with broad-spectrum antibiotics.  Leukocytosis otherwise not meeting SIRS criteria.  Saturating appropriately on room air - Further management pending Pulmonology consult. Recommendations appreciated.  - zosyn  - Sputum and blood culutre and MRSA Nares pending   Elevated troponin - Suspect demand ischemia in the setting of above, EKG non-ischemic.  - Trend troponins, EKG,  monitor on telemetry and obtain echo - Formal cardiology consult pending -- Holding anticoagulation in setting of supratherapeutic INR and possible bronch/biopsy  Combined systolic and diastolic heart failure s/p ICD CAD  PAF  Mitral valve prolapse status post replacement on Coumadin  Supratherapeutic INR - Appears euvolemic, hemoglobin stable. -Most recent echo with severely reduced ejection fraction.  Repeat echo pending as above - Resume  GDMT -Pharmacy to dose warfarin, assistance appreciated      Advance Care Planning:   Code Status: Full Code   Consults: Pulmonology and cardiology DVT prophylaxis-anticoagulated with supratherapeutic INR - N.p.o. midnight - No IVF given history of heart failure Monitor/replace electrolytes     Severity of Illness: The appropriate patient status for this patient is INPATIENT. Inpatient status is judged to be reasonable and necessary in order to provide the required intensity of service to ensure the patient's safety. The patient's presenting symptoms, physical exam findings, and initial radiographic and laboratory data in the context of their chronic comorbidities is felt to place them at high risk for further clinical deterioration. Furthermore, it is not anticipated that the patient will be medically stable for discharge from the hospital within 2 midnights of admission.   * I certify that at the point of admission it is my clinical judgment that the patient will require inpatient hospital care spanning beyond 2 midnights from the point of admission due to high intensity of service, high risk for further deterioration and high frequency of surveillance required.*  Author: Charlesetta Connors, DO 10/14/2023 1:32 AM  For on call review [email or web address].

## 2023-10-13 NOTE — Telephone Encounter (Signed)
 I spoke with Germain Kohler from Dr Carlyle Childes office and she will resubmit the clearance for patient's Thoracentesis TBD.  Apparently, Dr Matilde Son would like to Hold Coumadin  2-3 days per Paige.  We will await clearance form.

## 2023-10-13 NOTE — Progress Notes (Signed)
 ANTICOAGULATION CONSULT NOTE  Pharmacy Consult for Warfarin Indication: mechanical valve  Allergies  Allergen Reactions   Ace Inhibitors Cough   Lipitor [Atorvastatin Calcium ] Other (See Comments)    Knee pain    Megace [Megestrol] Other (See Comments)    High blood pressure and blurred vision   Aspirin  Other (See Comments)    Nephrologist said to not take this Update 10/07/22 - patient clarifies this recommendation was lumped in with a whole bunch of other medications at that time many years ago when nephrologist told her to stay away from Tylenol , vitamins, ASA. Denies prior allergy or allergic reaction to this   Clorazepate Dipotassium Other (See Comments)    Interacts with a drug being taken   Simvastatin Other (See Comments)    Muscle pain   Tiotropium Bromide Monohydrate Other (See Comments)    Dry mouth   Codeine Nausea And Vomiting   Tape Rash    Patient Measurements:   Vital Signs: Temp: 97.8 F (36.6 C) (05/13 1856) Temp Source: Oral (05/13 1856) BP: 157/63 (05/13 1930) Pulse Rate: 100 (05/13 1930)  Labs: Recent Labs    10/13/23 1447 10/13/23 1513 10/13/23 1755 10/13/23 2058  HGB  --  10.5*  --   --   HCT  --  33.2*  --   --   PLT  --  340  --   --   INR 4.4*  --   --   --   CREATININE  --  1.00  --   --   TROPONINIHS  --   --  1,614* 1,306*    CrCl cannot be calculated (Unknown ideal weight.).   Medical History: Past Medical History:  Diagnosis Date   Allergy    seasonal   Anemia    Anxiety    Arthritis    Asthma    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 Protecta   Biventricular ICD (implantable cardioverter-defibrillator) in place    with CRT   Blood transfusion without reported diagnosis    Bursitis    Cataract    RIGHT EYE   CHF (congestive heart failure) (HCC)    Chronic kidney disease    Colon polyp    adenomatous   Complication of anesthesia    patient stated that had difficulty getting the  breathing tube removed, patient said that she stopped breathing and HR dropped to 10  patient then woke up and started breathing pateint stated no longer than one minute; re-intubated in PACU following cholecystectomy 10/28/13   COPD (chronic obstructive pulmonary disease) (HCC)    Depression    Diverticulosis    Dysrhythmia    Fibromyalgia    GERD (gastroesophageal reflux disease)    Gout    H/O mitral valve replacement 2002, 2007   Heart murmur    Hemorrhoids    Hyperlipidemia    Hypertension    Hypothyroidism    IBS (irritable bowel syndrome)    MVP (mitral valve prolapse)    NAUSEA AND VOMITING 08/01/2009   Neuromuscular disorder (HCC)    fibromyalgia   Pacemaker    Peptic ulcer disease     Medications:  (Not in a hospital admission)  Scheduled:   budeson-glycopyrrolate -formoterol  2 puff Inhalation BID   [START ON 10/14/2023] buPROPion   150 mg Oral q AM   [START ON 10/14/2023] furosemide   20 mg Oral BID   icosapent  Ethyl  1 g Oral BID   [START ON 10/14/2023] irbesartan  150 mg Oral  Daily   [START ON 10/14/2023] levothyroxine   75 mcg Oral Q0600   [START ON 10/14/2023] metoprolol  succinate  25 mg Oral BH-q7a   And   metoprolol  succinate  50 mg Oral QPM   pantoprazole  (PROTONIX ) IV  40 mg Intravenous QHS   pravastatin   40 mg Oral QHS   senna  30 mg Oral QHS   [START ON 10/14/2023] spironolactone   25 mg Oral Daily   Infusions:   sodium chloride      vancomycin 750 mg (10/13/23 2106)   PRN: acetaminophen , albuterol , ALPRAZolam , antiseptic oral rinse, metoCLOPramide , traMADol , traZODone   Assessment: 34 yof with a history of CAD, PAF on warfarin, HF, s/p ICD, mitral valve prolapse status post MVR. Patient is presenting with SOB. Warfarin per pharmacy consult placed for mechanical valve.  Patient taking warfarin prior to arrival. Home dose is Take 1.25mg  by mouth every Friday and Sunday. Take 2.5mg  by mouth all other days of the week.. Last taken 5/12 1800.  INR today is 4.4,  which is supra-therapeutic Hgb 10.5; plt 340  Goal of Therapy:  INR Goal 2.5-3.5 Monitor platelets by anticoagulation protocol: Yes   Plan:  Patient seen at anticoag clinic today and instructed to hold today's dose. I agree with holding tonight.  Further dosing per INR in the AM Monitor for s/s of hemorrhage, daily INR, CBC Watch for new DDIs  Dionicio Fray, PharmD, BCPS 10/13/2023 9:47 PM ED Clinical Pharmacist -  [phone]

## 2023-10-14 ENCOUNTER — Other Ambulatory Visit: Payer: Self-pay

## 2023-10-14 ENCOUNTER — Inpatient Hospital Stay (HOSPITAL_COMMUNITY)

## 2023-10-14 ENCOUNTER — Encounter (HOSPITAL_COMMUNITY): Payer: Self-pay | Admitting: Emergency Medicine

## 2023-10-14 DIAGNOSIS — R918 Other nonspecific abnormal finding of lung field: Secondary | ICD-10-CM

## 2023-10-14 DIAGNOSIS — R7989 Other specified abnormal findings of blood chemistry: Secondary | ICD-10-CM | POA: Diagnosis not present

## 2023-10-14 DIAGNOSIS — R079 Chest pain, unspecified: Secondary | ICD-10-CM | POA: Diagnosis not present

## 2023-10-14 DIAGNOSIS — R042 Hemoptysis: Secondary | ICD-10-CM | POA: Diagnosis not present

## 2023-10-14 DIAGNOSIS — R0600 Dyspnea, unspecified: Secondary | ICD-10-CM

## 2023-10-14 DIAGNOSIS — M7989 Other specified soft tissue disorders: Secondary | ICD-10-CM | POA: Diagnosis not present

## 2023-10-14 LAB — COMPREHENSIVE METABOLIC PANEL WITH GFR
ALT: 20 U/L (ref 0–44)
AST: 30 U/L (ref 15–41)
Albumin: 2.4 g/dL — ABNORMAL LOW (ref 3.5–5.0)
Alkaline Phosphatase: 74 U/L (ref 38–126)
Anion gap: 12 (ref 5–15)
BUN: 16 mg/dL (ref 8–23)
CO2: 22 mmol/L (ref 22–32)
Calcium: 9.9 mg/dL (ref 8.9–10.3)
Chloride: 100 mmol/L (ref 98–111)
Creatinine, Ser: 1.22 mg/dL — ABNORMAL HIGH (ref 0.44–1.00)
GFR, Estimated: 45 mL/min — ABNORMAL LOW (ref >60)
Glucose, Bld: 97 mg/dL (ref 70–99)
Potassium: 4.1 mmol/L (ref 3.5–5.1)
Sodium: 134 mmol/L — ABNORMAL LOW (ref 135–145)
Total Bilirubin: 0.7 mg/dL (ref 0.0–1.2)
Total Protein: 6 g/dL — ABNORMAL LOW (ref 6.5–8.1)

## 2023-10-14 LAB — ECHOCARDIOGRAM COMPLETE
AR max vel: 2.37 cm2
AV Area VTI: 2.17 cm2
AV Area mean vel: 1.95 cm2
AV Mean grad: 4 mmHg
AV Peak grad: 6.5 mmHg
Ao pk vel: 1.27 m/s
Area-P 1/2: 3.65 cm2
Est EF: 25
Height: 62 in
MV VTI: 1.84 cm2
P 1/2 time: 562 ms
S' Lateral: 4 cm
Weight: 1481.49 [oz_av]

## 2023-10-14 LAB — CBC
HCT: 29 % — ABNORMAL LOW (ref 36.0–46.0)
Hemoglobin: 9.5 g/dL — ABNORMAL LOW (ref 12.0–15.0)
MCH: 30.3 pg (ref 26.0–34.0)
MCHC: 32.8 g/dL (ref 30.0–36.0)
MCV: 92.4 fL (ref 80.0–100.0)
Platelets: 326 10*3/uL (ref 150–400)
RBC: 3.14 MIL/uL — ABNORMAL LOW (ref 3.87–5.11)
RDW: 14.6 % (ref 11.5–15.5)
WBC: 15.3 10*3/uL — ABNORMAL HIGH (ref 4.0–10.5)
nRBC: 0 % (ref 0.0–0.2)

## 2023-10-14 LAB — PROTIME-INR
INR: 3.2 — ABNORMAL HIGH (ref 0.8–1.2)
Prothrombin Time: 33 s — ABNORMAL HIGH (ref 11.4–15.2)

## 2023-10-14 LAB — MRSA NEXT GEN BY PCR, NASAL: MRSA by PCR Next Gen: NOT DETECTED

## 2023-10-14 MED ORDER — ACETAMINOPHEN 325 MG PO TABS
650.0000 mg | ORAL_TABLET | Freq: Four times a day (QID) | ORAL | Status: DC | PRN
  Administered 2023-10-14 – 2023-10-20 (×7): 650 mg via ORAL
  Filled 2023-10-14 (×9): qty 2

## 2023-10-14 MED ORDER — WARFARIN 1.25 MG HALF TABLET: 1.2500 mg | ORAL_TABLET | Freq: Once | ORAL | Status: DC

## 2023-10-14 MED ORDER — PERFLUTREN LIPID MICROSPHERE
1.0000 mL | INTRAVENOUS | Status: AC | PRN
  Administered 2023-10-14: 2 mL via INTRAVENOUS

## 2023-10-14 MED ORDER — VITAMIN K1 10 MG/ML IJ SOLN
2.5000 mg | Freq: Once | INTRAVENOUS | Status: AC
  Administered 2023-10-14: 2.5 mg via INTRAVENOUS
  Filled 2023-10-14: qty 0.25

## 2023-10-14 NOTE — Progress Notes (Addendum)
 ANTICOAGULATION CONSULT NOTE  Pharmacy Consult for Warfarin Indication: mechanical valve  Allergies  Allergen Reactions   Ace Inhibitors Cough   Lipitor [Atorvastatin Calcium ] Other (See Comments)    Knee pain    Megace [Megestrol] Other (See Comments)    High blood pressure and blurred vision   Aspirin  Other (See Comments)    Nephrologist said to not take this Update 10/07/22 - patient clarifies this recommendation was lumped in with a whole bunch of other medications at that time many years ago when nephrologist told her to stay away from Tylenol , vitamins, ASA. Denies prior allergy or allergic reaction to this   Clorazepate Dipotassium Other (See Comments)    Interacts with a drug being taken   Simvastatin Other (See Comments)    Muscle pain   Tiotropium Bromide Monohydrate Other (See Comments)    Dry mouth   Codeine Nausea And Vomiting   Tape Rash    Patient Measurements: Height: 5\' 2"  (157.5 cm) Weight: 42 kg (92 lb 9.5 oz) IBW/kg (Calculated) : 50.1 Vital Signs: Temp: 99 F (37.2 C) (05/14 0827) Temp Source: Oral (05/14 0827) BP: 152/76 (05/14 0827) Pulse Rate: 91 (05/14 0827)  Labs: Recent Labs    10/13/23 1447 10/13/23 1513 10/13/23 1755 10/13/23 2058 10/14/23 0617  HGB  --  10.5*  --   --  9.5*  HCT  --  33.2*  --   --  29.0*  PLT  --  340  --   --  326  LABPROT  --   --   --   --  33.0*  INR 4.4*  --   --   --  3.2*  CREATININE  --  1.00  --   --   --   TROPONINIHS  --   --  1,614* 1,306*  --     Estimated Creatinine Clearance: 29.3 mL/min (by C-G formula based on SCr of 1 mg/dL).   Medical History: Past Medical History:  Diagnosis Date   Allergy    seasonal   Anemia    Anxiety    Arthritis    Asthma    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 Protecta   Biventricular ICD (implantable cardioverter-defibrillator) in place    with CRT   Blood transfusion without reported diagnosis    Bursitis    Cataract     RIGHT EYE   CHF (congestive heart failure) (HCC)    Chronic kidney disease    Colon polyp    adenomatous   Complication of anesthesia    patient stated that had difficulty getting the breathing tube removed, patient said that she stopped breathing and HR dropped to 10  patient then woke up and started breathing pateint stated no longer than one minute; re-intubated in PACU following cholecystectomy 10/28/13   COPD (chronic obstructive pulmonary disease) (HCC)    Depression    Diverticulosis    Dysrhythmia    Fibromyalgia    GERD (gastroesophageal reflux disease)    Gout    H/O mitral valve replacement 2002, 2007   Heart murmur    Hemorrhoids    Hyperlipidemia    Hypertension    Hypothyroidism    IBS (irritable bowel syndrome)    MVP (mitral valve prolapse)    NAUSEA AND VOMITING 08/01/2009   Neuromuscular disorder (HCC)    fibromyalgia   Pacemaker    Peptic ulcer disease     Medications:  Facility-Administered Medications Prior to Admission  Medication Dose Route  Frequency Provider Last Rate Last Admin   0.9 %  sodium chloride  infusion  500 mL Intravenous Continuous Tobin Forts, MD       Medications Prior to Admission  Medication Sig Dispense Refill Last Dose/Taking   acetaminophen  (TYLENOL ) 650 MG CR tablet Take 650 mg by mouth every 8 (eight) hours as needed for pain.   Unknown   allopurinol  (ZYLOPRIM ) 300 MG tablet Take 300 mg by mouth every evening.    10/13/2023   ALPRAZolam  (XANAX ) 0.25 MG tablet Take 0.25 mg by mouth at bedtime as needed for anxiety or sleep.    Unknown   antiseptic oral rinse (BIOTENE) LIQD 15 mLs by Mouth Rinse route as needed for dry mouth.   Unknown   buPROPion  (WELLBUTRIN  SR) 150 MG 12 hr tablet Take 150 mg by mouth in the morning.   10/13/2023   esomeprazole  (NEXIUM ) 40 MG capsule TAKE 1 CAPSULE BY MOUTH EVERY DAY BEFORE BREAKFAST 15 capsule 0 10/13/2023   furosemide  (LASIX ) 20 MG tablet Take 20 mg by mouth 2 (two) times daily.   10/13/2023    icosapent  Ethyl (VASCEPA ) 1 g capsule Take 1 capsule (1 g total) by mouth 2 (two) times daily. 60 capsule 11 10/13/2023   ipratropium (ATROVENT ) 0.06 % nasal spray Place 2 sprays into both nostrils daily as needed for rhinitis.   Unknown   levothyroxine  (SYNTHROID ) 75 MCG tablet Take 75 mcg by mouth daily before breakfast.   10/13/2023   metoCLOPramide  (REGLAN ) 5 MG tablet Take 5 mg by mouth 2 (two) times daily as needed for nausea or vomiting.   Unknown   metoprolol  succinate (TOPROL -XL) 50 MG 24 hr tablet Take 0.5 tablets (25 mg total) by mouth every morning AND 1 tablet (50 mg total) every evening. Take with or immediately following a meal.. 135 tablet 3 10/13/2023   Multiple Vitamin (MULTIVITAMIN WITH MINERALS) TABS tablet Take 1 tablet by mouth daily.   10/13/2023   pravastatin  (PRAVACHOL ) 40 MG tablet Take 40 mg at bedtime by mouth.    10/12/2023   PROAIR  HFA 108 (90 BASE) MCG/ACT inhaler Inhale 1-2 puffs into the lungs every 6 (six) hours as needed for wheezing.   10/13/2023 Noon   spironolactone  (ALDACTONE ) 25 MG tablet Take 1 tablet (25 mg total) by mouth daily. 30 tablet 11 10/13/2023   traMADol  (ULTRAM ) 50 MG tablet Take 1 tablet (50 mg total) by mouth every 8 (eight) hours as needed for moderate pain or severe pain (1 tablet every 8 hours as needed for pain). 30 tablet 0 Unknown   traZODone  (DESYREL ) 50 MG tablet Take 25 mg by mouth at bedtime as needed.   Unknown   TRELEGY ELLIPTA 100-62.5-25 MCG/ACT AEPB Inhale 1 puff into the lungs daily.   10/13/2023   valsartan  (DIOVAN ) 160 MG tablet Take 1 tablet (160 mg total) by mouth daily. 90 tablet 3 10/13/2023   warfarin (COUMADIN ) 2.5 MG tablet TAKE 1 TO 2 TABLETS BY MOUTH DAILY AS DIRECTED BY THE COUMADIN  CLINIC (Patient taking differently: Take 1.25-2.5 mg by mouth See admin instructions. Take 1.25mg  by mouth every Friday and Sunday. Take 2.5mg  by mouth all other days of the week.) 45 tablet 3 10/12/2023 at  6:00 PM   ferrous sulfate  325 (65 FE) MG  tablet Take 1 tablet (325 mg total) by mouth daily with breakfast. (Patient not taking: Reported on 10/13/2023) 100 tablet 0 Not Taking   furosemide  (LASIX ) 40 MG tablet Take 1 tablet (40 mg total) by mouth  daily. (Patient not taking: Reported on 10/13/2023) 90 tablet 3 Not Taking   Sennosides (SENNA) 15 MG TABS Take 30 mg by mouth at bedtime. (Patient not taking: Reported on 10/13/2023)   Not Taking   Scheduled:   budeson-glycopyrrolate -formoterol  2 puff Inhalation BID   buPROPion   150 mg Oral q AM   furosemide   20 mg Oral BID   icosapent  Ethyl  1 g Oral BID   irbesartan  150 mg Oral Daily   levothyroxine   75 mcg Oral Q0600   metoprolol  succinate  25 mg Oral BH-q7a   And   metoprolol  succinate  50 mg Oral QPM   pantoprazole  (PROTONIX ) IV  40 mg Intravenous QHS   pravastatin   40 mg Oral QHS   senna  30 mg Oral QHS   spironolactone   25 mg Oral Daily   Warfarin - Pharmacist Dosing Inpatient   Does not apply q1600   Infusions:   piperacillin-tazobactam (ZOSYN)  IV 12.5 mL/hr at 10/14/23 0548   PRN: acetaminophen , albuterol , ALPRAZolam , antiseptic oral rinse, metoCLOPramide , traMADol , traZODone   Assessment: 4 yof with a history of CAD, PAF on warfarin, HF, s/p ICD, mitral valve prolapse status post MVR. Patient is presenting with SOB. Warfarin per pharmacy consult placed for mechanical valve.  Patient taking warfarin prior to arrival. Home dose is Take 1.25mg  by mouth every Friday and Sunday. Take 2.5mg  by mouth all other days of the week.. Last taken 5/12 1800.  5/14 AM update: INR 3.2 Hgb 9.5 PLT wnl Diet- NPO  Goal of Therapy:  INR Goal 2.5-3.5 Monitor platelets by anticoagulation protocol: Yes   Plan:  No warfarin today (patient may go for a bronchoscopy today / tomorrow) Vitamin K 2.5 mg iv x1 (spoke with Dr. Rosaline Coma) Goal INR for bronch <1.5  Monitor for s/s of hemorrhage, daily INR, CBC Watch for new DDIs   Marleta Simmer BS, PharmD, BCPS Clinical Pharmacist 10/14/2023  8:30 AM  Contact: [phone] after 3 PM  "Be curious, not judgmental..." -Rumalda Counter

## 2023-10-14 NOTE — Consult Note (Signed)
 NAME:  [name], MRN:  [id number], DOB:  1941-05-16, LOS: 1 ADMISSION DATE:  10/13/2023, CONSULTATION DATE: 10/14/2023 REFERRING MD: [facility], CHIEF COMPLAINT: Hemoptysis shortness of breath  History of Present Illness:  83 year old female quit smoking 20 years ago with extensive past medical history in the areas of cardiology and orthopedics with 2 shoulder replacements and a pacemaker implantation.  CT scan reveals a large esophageal mass and right lower lobe opacification.  She has shortness of breath with any activity and with eating.  She has intermittent periods of hemoptysis with daily sputum production that is either clear or bloody.  Pulmonary critical care asked to evaluate.  Pertinent  Medical History   Past Medical History:  Diagnosis Date   Allergy    seasonal   Anemia    Anxiety    Arthritis    Asthma    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 Protecta   Biventricular ICD (implantable cardioverter-defibrillator) in place    with CRT   Blood transfusion without reported diagnosis    Bursitis    Cataract    RIGHT EYE   CHF (congestive heart failure) (HCC)    Chronic kidney disease    Colon polyp    adenomatous   Complication of anesthesia    patient stated that had difficulty getting the breathing tube removed, patient said that she stopped breathing and HR dropped to 10  patient then woke up and started breathing pateint stated no longer than one minute; re-intubated in PACU following cholecystectomy 10/28/13   COPD (chronic obstructive pulmonary disease) (HCC)    Depression    Diverticulosis    Dysrhythmia    Fibromyalgia    GERD (gastroesophageal reflux disease)    Gout    H/O mitral valve replacement 2002, 2007   Heart murmur    Hemorrhoids    Hyperlipidemia    Hypertension    Hypothyroidism    IBS (irritable bowel syndrome)    MVP (mitral valve prolapse)    NAUSEA AND VOMITING 08/01/2009   Neuromuscular disorder (HCC)     fibromyalgia   Pacemaker    Peptic ulcer disease      Significant Hospital Events: Including procedures, antibiotic start and stop dates in addition to other pertinent events     Interim History / Subjective:  Continues to have intermittent hemoptysis and shortness of breath with eating or any activity  Objective    Blood pressure (!) 152/76, pulse 91, temperature 99 F (37.2 C), temperature source Oral, resp. rate 18, height 5\' 2"  (1.575 m), weight 42 kg, SpO2 96%.        Intake/Output Summary (Last 24 hours) at 10/14/2023 0859 Last data filed at 10/14/2023 0548 Gross per 24 hour  Intake 13.3 ml  Output --  Net 13.3 ml   Filed Weights   10/14/23 0451  Weight: 42 kg    Examination: General: Frail cachectic female no acute distress HENT: No JVD lymphadenopathy is appreciated Lungs: Decreased breath sounds right base Cardiovascular: Heart sounds are distant Abdomen: Soft nontender Extremities: Mild edema Neuro: Grossly intact voids GU: Voids  Resolved problem list   Assessment and Plan  Shortness of breath, hemoptysis x 1 inability to eat secondary to shortness of breath large soft tissue mass lower esophagus and near opacification of the right lower lobe with increasing shortness of breath intermittent hemoptysis 83 year old female has a plethora of health issues extensive cardiac history that includes a pacemaker who now has near opacification of the  right lower lobe and soft tissue mass distal esophagus and question for the pulmonary team is diagnostic intervention.  Question is could she withstand any type of radiation chemotherapy or surgical intervention at this time.  This will be discussed with the pulmonologist of record.  All other issues per primary  Best Practice (right click and "Reselect all SmartList Selections" daily)   Diet/type: Regular consistency (see orders) DVT prophylaxis prophylactic heparin   Pressure ulcer(s): N/A GI prophylaxis:  PPI Lines: N/A Foley:  N/A Code Status:  full code Last date of multidisciplinary goals of care discussion [tbd]  Labs   CBC: Recent Labs  Lab 10/13/23 1513 10/14/23 0617  WBC 16.8* 15.3*  HGB 10.5* 9.5*  HCT 33.2* 29.0*  MCV 96.8 92.4  PLT 340 326    Basic Metabolic Panel: Recent Labs  Lab 10/13/23 1513 10/14/23 0617  NA 134* 134*  K 3.7 4.1  CL 98 100  CO2 26 22  GLUCOSE 136* 97  BUN 16 16  CREATININE 1.00 1.22*  CALCIUM  10.2 9.9   GFR: Estimated Creatinine Clearance: 24 mL/min (A) (by C-G formula based on SCr of 1.22 mg/dL (H)). Recent Labs  Lab 10/13/23 1513 10/13/23 1755 10/14/23 0617  WBC 16.8*  --  15.3*  LATICACIDVEN  --  1.0  --     Liver Function Tests: Recent Labs  Lab 10/14/23 0617  AST 30  ALT 20  ALKPHOS 74  BILITOT 0.7  PROT 6.0*  ALBUMIN 2.4*   No results for input(s): "LIPASE", "AMYLASE" in the last 168 hours. No results for input(s): "AMMONIA" in the last 168 hours.  ABG    Component Value Date/Time   PHART 7.416 10/08/2022 1128   PCO2ART 42.5 10/08/2022 1128   PO2ART 70 (L) 10/08/2022 1128   HCO3 27.3 10/08/2022 1128   TCO2 29 10/08/2022 1128   ACIDBASEDEF 2.0 06/20/2022 1619   O2SAT 94 10/08/2022 1128     Coagulation Profile: Recent Labs  Lab 10/13/23 1447 10/14/23 0617  INR 4.4* 3.2*    Cardiac Enzymes: No results for input(s): "CKTOTAL", "CKMB", "CKMBINDEX", "TROPONINI" in the last 168 hours.  HbA1C: No results found for: "HGBA1C"  CBG: No results for input(s): "GLUCAP" in the last 168 hours.  Review of Systems:   10 point review of system taken, please see HPI for positives and negatives.   Past Medical History:  She,  has a past medical history of Allergy, Anemia, Anxiety, Arthritis, Asthma, Automatic implantable cardioverter-defibrillator in situ, Biventricular ICD (implantable cardioverter-defibrillator) in place, Blood transfusion without reported diagnosis, Bursitis, Cataract, CHF (congestive heart  failure) (HCC), Chronic kidney disease, Colon polyp, Complication of anesthesia, COPD (chronic obstructive pulmonary disease) (HCC), Depression, Diverticulosis, Dysrhythmia, Fibromyalgia, GERD (gastroesophageal reflux disease), Gout, H/O mitral valve replacement (2002, 2007), Heart murmur, Hemorrhoids, Hyperlipidemia, Hypertension, Hypothyroidism, IBS (irritable bowel syndrome), MVP (mitral valve prolapse), NAUSEA AND VOMITING (08/01/2009), Neuromuscular disorder (HCC), Pacemaker, and Peptic ulcer disease.   Surgical History:   Past Surgical History:  Procedure Laterality Date   ABDOMINAL HYSTERECTOMY     BIV ICD GENERATOR CHANGEOUT N/A 04/29/2017   Procedure: BIV ICD GENERATOR CHANGEOUT;  Surgeon: Luana Rumple, MD;  Location: [facility];  Service: Cardiovascular;  Laterality: N/A;   CARDIAC CATHETERIZATION  02/25/2006   normal left main, normal LAD, normal L Cfx, normal/dominant RCA (Dr. Aleda Ammon)   CARDIAC DEFIBRILLATOR PLACEMENT  2007, 11/202012   x2 (pacemaker) (Dr. Melven Stable. Croitoru)   CARDIAC VALVE REPLACEMENT  2002   MV repair - Dr.  Isla Mari   CHOLECYSTECTOMY N/A 10/28/2013   Procedure: LAPAROSCOPIC CHOLECYSTECTOMY WITH INTRAOPERATIVE CHOLANGIOGRAM;  Surgeon: Quitman Bucy, MD;  Location: [facility] OR;  Service: General;  Laterality: N/A;   COLONOSCOPY     ENTEROSCOPY N/A 11/09/2021   Procedure: ENTEROSCOPY;  Surgeon: Brice Campi Albino Alu., MD;  Location: [facility] ENDOSCOPY;  Service: Gastroenterology;  Laterality: N/A;   ESOPHAGOGASTRODUODENOSCOPY (EGD) WITH PROPOFOL  N/A 03/02/2021   Procedure: ESOPHAGOGASTRODUODENOSCOPY (EGD) WITH PROPOFOL ;  Surgeon: Elois Hair, MD;  Location[facility] ENDOSCOPY;  Service: Gastroenterology;  Laterality: N/A;   EYE SURGERY     HEMOSTASIS CLIP PLACEMENT  03/02/2021   Procedure: HEMOSTASIS CLIP PLACEMENT;  Surgeon: Elois Hair, MD;  Location: [facility] ENDOSCOPY;  Service: Gastroenterology;;   HEMOSTASIS CLIP PLACEMENT  11/09/2021   Procedure:  HEMOSTASIS CLIP PLACEMENT;  Surgeon: Normie Becton., MD;  Location: [facility] ENDOSCOPY;  Service: Gastroenterology;;   HOT HEMOSTASIS  03/02/2021   Procedure: HOT HEMOSTASIS (ARGON PLASMA COAGULATION/BICAP);  Surgeon: Elois Hair, MD;  [facility] ENDOSCOPY;  Service: Gastroenterology;;   HOT HEMOSTASIS N/A 11/09/2021   Procedure: HOT HEMOSTASIS (ARGON PLASMA COAGULATION/BICAP);  Surgeon: Normie Becton., MD;  Location: [facility] ENDOSCOPY;  Service: Gastroenterology;  Laterality: N/A;   IMPLANTABLE CARDIOVERTER DEFIBRILLATOR (ICD) GENERATOR CHANGE N/A 04/10/2011   Procedure: ICD GENERATOR CHANGE;  Surgeon: Luana Rumple, MD;  Location: [facility] CATH LAB;  Service: Cardiovascular;  Laterality: N/A;   INSERT / REPLACE / REMOVE PACEMAKER     KNEE ARTHROSCOPY     MITRAL VALVE REPLACEMENT  02/26/2006   re-do MVR w/31mm St. [name] (Dr. Cherylynn Cosier)   NM MYOCAR PERF WALL MOTION  2005   persantine myoview - low ris, EF 63%   RIGHT HEART CATH  04/03/2006   pulm cap wedge pressure 24/24, PA pressure 43/22 (mean ), CO 4.8, CI 4.1 (Dr. Jammie Mccune)   RIGHT/LEFT HEART CATH AND CORONARY ANGIOGRAPHY N/A 10/08/2022   Procedure: RIGHT/LEFT HEART CATH AND CORONARY ANGIOGRAPHY;  Surgeon: Millicent Ally, MD;  Location: [facility];  Service: Cardiovascular;  Laterality: N/A;   SCLEROTHERAPY  03/02/2021   Procedure: SCLEROTHERAPY;  Surgeon: Elois Hair, MD;  Location: [facility] ENDOSCOPY;  Service: Gastroenterology;;   SUBMUCOSAL TATTOO INJECTION  11/09/2021   Procedure: SUBMUCOSAL TATTOO INJECTION;  Surgeon: Normie Becton., MD;  Location: [facility] ENDOSCOPY;  Service: Gastroenterology;;   TOTAL SHOULDER ARTHROPLASTY Right 12/27/2015   Procedure: RIGHT TOTAL SHOULDER ARTHROPLASTY;  Surgeon: Sammye Cristal, MD;  Location: [facility] OR;  Service: Orthopedics;  Laterality: Right;  Right total shoulder arthroplasty   TOTAL SHOULDER ARTHROPLASTY Left 10/27/2019   Procedure: REVERSE TOTAL SHOULDER ARTHROPLASTY;   Surgeon: Sammye Cristal, MD;  Location: [facility] ORS;  Service: Orthopedics;  Laterality: Left;   TRANSTHORACIC ECHOCARDIOGRAM  12/2011   EF 50-55%, mild global hypokinesis; LA severely dilated; calcification of anterior/posterior MV leaflets, bi-leaflets St. [name] mechanical MV; mild TR; trace AV regurg/pulm valve regurg     Social History:   reports that she quit smoking about 22 years ago. Her smoking use included cigarettes. She has never used smokeless tobacco. She reports current alcohol use. She reports that she does not use drugs.   Family History:  Her family history includes Breast cancer in her sister; Crohn's disease in her sister; Heart attack in her father and mother; Heart disease in her father, mother, sister, and son; Kidney disease in her father; Uterine cancer in her sister. There is no history of Colon cancer or Stomach cancer.   Allergies Allergies  Allergen Reactions   Ace Inhibitors Cough   Lipitor [Atorvastatin Calcium ] Other (See Comments)    Knee pain    Megace [Megestrol] Other (See Comments)    High blood pressure and blurred vision   Aspirin  Other (See Comments)    Nephrologist said to not take this Update 10/07/22 - patient clarifies this recommendation was lumped in with a whole bunch of other medications at that time many years ago when nephrologist told her to stay away from Tylenol , vitamins, ASA. Denies prior allergy or allergic reaction to this   Clorazepate Dipotassium Other (See Comments)    Interacts with a drug being taken   Simvastatin Other (See Comments)    Muscle pain   Tiotropium Bromide Monohydrate Other (See Comments)    Dry mouth   Codeine Nausea And Vomiting   Tape Rash     Home Medications  Prior to Admission medications   Medication Sig Start Date End Date Taking? Authorizing Provider  acetaminophen  (TYLENOL ) 650 MG CR tablet Take 650 mg by mouth every 8 (eight) hours as needed for pain.   Yes [provider]  allopurinol   (ZYLOPRIM ) 300 MG tablet Take 300 mg by mouth every evening.    Yes [provider]  ALPRAZolam  (XANAX ) 0.25 MG tablet Take 0.25 mg by mouth at bedtime as needed for anxiety or sleep.    Yes [provider]  antiseptic oral rinse (BIOTENE) LIQD 15 mLs by Mouth Rinse route as needed for dry mouth.   Yes [provider]  buPROPion  (WELLBUTRIN  SR) 150 MG 12 hr tablet Take 150 mg by mouth in the morning.   Yes [provider]  esomeprazole  (NEXIUM ) 40 MG capsule TAKE 1 CAPSULE BY MOUTH EVERY DAY BEFORE BREAKFAST 02/09/23  Yes Esterwood, Amy S, PA-C  furosemide  (LASIX ) 20 MG tablet Take 20 mg by mouth 2 (two) times daily. 09/18/23  Yes [provider]  icosapent  Ethyl (VASCEPA ) 1 g capsule Take 1 capsule (1 g total) by mouth 2 (two) times daily. 09/03/22  Yes Hilty, Aviva Lemmings, MD  ipratropium (ATROVENT ) 0.06 % nasal spray Place 2 sprays into both nostrils daily as needed for rhinitis. 07/07/22  Yes [provider]  levothyroxine  (SYNTHROID ) 75 MCG tablet Take 75 mcg by mouth daily before breakfast.   Yes [provider]  metoCLOPramide  (REGLAN ) 5 MG tablet Take 5 mg by mouth 2 (two) times daily as needed for nausea or vomiting. 11/05/21  Yes [provider]  metoprolol  succinate (TOPROL -XL) 50 MG 24 hr tablet Take 0.5 tablets (25 mg total) by mouth every morning AND 1 tablet (50 mg total) every evening. Take with or immediately following a meal.. 11/17/22  Yes Croitoru, Mihai, MD  Multiple Vitamin (MULTIVITAMIN WITH MINERALS) TABS tablet Take 1 tablet by mouth daily.   Yes [provider]  pravastatin  (PRAVACHOL ) 40 MG tablet Take 40 mg at bedtime by mouth.    Yes [provider]  PROAIR  HFA 108 (90 BASE) MCG/ACT inhaler Inhale 1-2 puffs into the lungs every 6 (six) hours as needed for wheezing. 01/23/15  Yes [provider]  spironolactone  (ALDACTONE ) 25 MG tablet Take 1 tablet (25 mg total) by mouth daily. 10/31/22  Yes  Ruddy Corral M, PA-C  traMADol  (ULTRAM ) 50 MG tablet Take 1 tablet (50 mg total) by mouth every 8 (eight) hours as needed for moderate pain or severe pain (1 tablet every 8 hours as needed for pain). 02/10/23  Yes Croitoru, Karyl Paget, MD  traZODone  (DESYREL )  50 MG tablet Take 25 mg by mouth at bedtime as needed. 02/04/23  Yes [provider]  TRELEGY ELLIPTA 100-62.5-25 MCG/ACT AEPB Inhale 1 puff into the lungs daily. 10/02/23  Yes [provider]  valsartan  (DIOVAN ) 160 MG tablet Take 1 tablet (160 mg total) by mouth daily. 02/10/23  Yes Croitoru, Mihai, MD  warfarin (COUMADIN ) 2.5 MG tablet TAKE 1 TO 2 TABLETS BY MOUTH DAILY AS DIRECTED BY THE COUMADIN  CLINIC Patient taking differently: Take 1.25-2.5 mg by mouth See admin instructions. Take 1.25mg  by mouth every Friday and Sunday. Take 2.5mg  by mouth all other days of the week. 02/03/23  Yes Avanell Leigh, MD  ferrous sulfate  325 (65 FE) MG tablet Take 1 tablet (325 mg total) by mouth daily with breakfast. Patient not taking: Reported on 10/13/2023 10/15/22   Deforest Fast, MD  furosemide  (LASIX ) 40 MG tablet Take 1 tablet (40 mg total) by mouth daily. Patient not taking: Reported on 10/13/2023 11/17/22   Croitoru, Mihai, MD  Sennosides (SENNA) 15 MG TABS Take 30 mg by mouth at bedtime. Patient not taking: Reported on 10/13/2023    [provider]     Critical care time: Bonnee Bustle Nazaiah Navarrete ACNP Acute Care Nurse Practitioner Jonny Neu Pulmonary/Critical Care Please consult Amion 10/14/2023, 8:59 AM

## 2023-10-14 NOTE — Progress Notes (Signed)
 Progress Note  Patient Name: [name] Date of Encounter: 10/14/2023  Primary Cardiologist: Lauro Portal, MD   Subjective   Patient seen examined her bedside.  Inpatient Medications    Scheduled Meds:  budeson-glycopyrrolate -formoterol  2 puff Inhalation BID   buPROPion   150 mg Oral q AM   furosemide   20 mg Oral BID   icosapent  Ethyl  1 g Oral BID   irbesartan  150 mg Oral Daily   levothyroxine   75 mcg Oral Q0600   metoprolol  succinate  25 mg Oral BH-q7a   And   metoprolol  succinate  50 mg Oral QPM   pantoprazole  (PROTONIX ) IV  40 mg Intravenous QHS   pravastatin   40 mg Oral QHS   senna  30 mg Oral QHS   spironolactone   25 mg Oral Daily   Warfarin - Pharmacist Dosing Inpatient   Does not apply q1600   Continuous Infusions:  phytonadione  (VITAMIN K) 2.5 mg in dextrose  5 % 50 mL IVPB 2.5 mg (10/14/23 0947)   piperacillin-tazobactam (ZOSYN)  IV 12.5 mL/hr at 10/14/23 0548   PRN Meds: acetaminophen , albuterol , ALPRAZolam , antiseptic oral rinse, metoCLOPramide , perflutren lipid microspheres (DEFINITY) IV suspension, traMADol , traZODone    Vital Signs    Vitals:   10/13/23 2145 10/13/23 2246 10/14/23 0451 10/14/23 0827  BP: (!) 151/64 (!) 148/61 135/66 (!) 152/76  Pulse: 95 94 96 91  Resp: 20 18 18 18   Temp:  98.6 F (37 C) 99 F (37.2 C) 99 F (37.2 C)  TempSrc:  Oral Oral Oral  SpO2: 97% 100% 98% 96%  Weight:   42 kg   Height:   5\' 2"  (1.575 m)     Intake/Output Summary (Last 24 hours) at 10/14/2023 1003 Last data filed at 10/14/2023 0548 Gross per 24 hour  Intake 13.3 ml  Output --  Net 13.3 ml   Filed Weights   10/14/23 0451  Weight: 42 kg    Telemetry     - Personally Reviewed  ECG     - Personally Reviewed  Physical Exam     General: Comfortable, Head: Atraumatic, normal size  Eyes: PEERLA, EOMI  Neck: Supple, normal JVD Cardiac: Normal S1, S2; RRR; no murmurs, rubs, or gallops Lungs: Clear to auscultation bilaterally Abd:  Soft, nontender, no hepatomegaly  Ext: warm, no edema Musculoskeletal: No deformities, BUE and BLE strength normal and equal Skin: Warm and dry, no rashes   Neuro: Alert and oriented to person, place, time, and situation, CNII-XII grossly intact, no focal deficits  Psych: Normal mood and affect   Labs    Chemistry Recent Labs  Lab 10/13/23 1513 10/14/23 0617  NA 134* 134*  K 3.7 4.1  CL 98 100  CO2 26 22  GLUCOSE 136* 97  BUN 16 16  CREATININE 1.00 1.22*  CALCIUM  10.2 9.9  PROT  --  6.0*  ALBUMIN  --  2.4*  AST  --  30  ALT  --  20  ALKPHOS  --  74  BILITOT  --  0.7  GFRNONAA 57* 45*  ANIONGAP 10 12     Hematology Recent Labs  Lab 10/13/23 1513 10/14/23 0617  WBC 16.8* 15.3*  RBC 3.43* 3.14*  HGB 10.5* 9.5*  HCT 33.2* 29.0*  MCV 96.8 92.4  MCH 30.6 30.3  MCHC 31.6 32.8  RDW 14.4 14.6  PLT 340 326    Cardiac EnzymesNo results for input(s): "TROPONINI" in the last 168 hours. No results for input(s): "TROPIPOC" in the  last 168 hours.   BNP Recent Labs  Lab 10/13/23 1755  BNP 580.4*     DDimer No results for input(s): "DDIMER" in the last 168 hours.   Radiology    CT Soft Tissue Neck W Contrast Result Date: 10/13/2023 CLINICAL DATA:  Stridor and chest pain EXAM: CT NECK WITH CONTRAST TECHNIQUE: Multidetector CT imaging of the neck was performed using the standard protocol following the bolus administration of intravenous contrast. RADIATION DOSE REDUCTION: This exam was performed according to the departmental dose-optimization program which includes automated exposure control, adjustment of the mA and/or kV according to patient size and/or use of iterative reconstruction technique. CONTRAST:  75mL OMNIPAQUE  IOHEXOL  350 MG/ML SOLN COMPARISON:  None Available. FINDINGS: Pharynx and larynx: Normal. No mass or swelling. Salivary glands: No inflammation, mass, or stone. Thyroid: Normal Lymph nodes: None enlarged or abnormal density. Vascular: Numerous small  caliber collaterals surrounding the spinal column. Major vessels are patent. Calcific aortic atherosclerosis. Limited intracranial: Normal Visualized orbits: Normal Mastoids and visualized paranasal sinuses: Clear Skeleton: Negative Upper chest: Biapical emphysema. Other: Patulous debris containing lower cervical and upper thoracic esophagus. IMPRESSION: 1. Patulous debris containing lower cervical and upper thoracic esophagus. 2. No acute abnormality of the neck. Aortic Atherosclerosis (ICD10-I70.0) and Emphysema (ICD10-J43.9). Electronically Signed   By: Juanetta Nordmann M.D.   On: 10/13/2023 19:45   CT Angio Chest PE W and/or Wo Contrast Result Date: 10/13/2023 CLINICAL DATA:  Chest discomfort, difficulty breathing, stridor, throat tightness EXAM: CT ANGIOGRAPHY CHEST WITH CONTRAST TECHNIQUE: Multidetector CT imaging of the chest was performed using the standard protocol during bolus administration of intravenous contrast. Multiplanar CT image reconstructions and MIPs were obtained to evaluate the vascular anatomy. RADIATION DOSE REDUCTION: This exam was performed according to the departmental dose-optimization program which includes automated exposure control, adjustment of the mA and/or kV according to patient size and/or use of iterative reconstruction technique. CONTRAST:  75mL OMNIPAQUE  IOHEXOL  350 MG/ML SOLN COMPARISON:  10/13/2023, 06/22/2022 FINDINGS: Cardiovascular: This is a technically adequate evaluation of the pulmonary vasculature. No filling defects or pulmonary emboli. The heart is enlarged, with marked left ventricular dilatation. Multi lead cardiac pacer again identified. No evidence of thoracic aortic aneurysm or dissection. Atherosclerosis throughout the aorta and coronary vasculature. 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 Mediastinum/Nodes: Subcarinal  soft tissue mass measures up to 3.2 x 5.6 cm, reference image 63/4. This has mass effect upon the midthoracic esophagus, which is deviated to the left. This results in proximal esophageal dilatation and retained secretions. The trachea appears unremarkable. Thyroid is not well visualized. Lungs/Pleura: There is complete consolidation with volume loss in the right lower lobe. There is opacification of the bronchus intermedius and right lower lobe bronchus. Central obstructing neoplasm cannot be excluded. Upper lobe predominant emphysema. Trace right pleural effusion. No pneumothorax. Upper Abdomen: Bilateral adrenal nodules, measuring 14 mm on the right and 10 mm on the left. No other acute upper abdominal findings. Musculoskeletal: No acute or destructive bony abnormalities. Bilateral shoulder arthroplasties. Reconstructed images demonstrate no additional findings. Review of the MIP images confirms the above findings. IMPRESSION: 1. Large soft tissue mass in the subcarinal region, with mass effect upon the midthoracic esophagus. Findings compatible with malignancy or adenopathy. 2. Dense consolidation of the right lower lobe with volume loss, as well as opacification of the bronchus intermedius and right lower lobe bronchial tree. Central obstructing neoplasm  is suspected. Bronchoscopy may be useful. 3. Trace right pleural effusion. 4. No evidence of pulmonary embolus. 5. Nonspecific bilateral adrenal nodularity, metastatic disease not excluded in light of intrathoracic findings. 6. Cardiomegaly with prominent left ventricular dilatation. 7. Aortic Atherosclerosis (ICD10-I70.0) and Emphysema (ICD10-J43.9). Electronically Signed   By: Bobbye Burrow M.D.   On: 10/13/2023 18:51   DG Chest 2 View Result Date: 10/13/2023 CLINICAL DATA:  Shortness of breath.  Wheezing. EXAM: CHEST - 2 VIEW COMPARISON:  Chest radiographs 10/03/2022, 06/20/2022, 11/18/2021 FINDINGS: Status post median sternotomy and mitral valve  prosthesis, unchanged. Mild high-grade atherosclerotic calcifications within the aortic arch. Left chest wall AICD with leads overlying the right atrium, right antrum ventricle, and coronary sinus. Cardiac silhouette is again at the upper limits of normal size. Mediastinal contours are within normal limits. Mild bilateral interstitial thickening is actually improved from 10/03/2022 and 06/20/2022 and may represent the patient's normal baseline, mild chronic fibrosis. Small to moderate right pleural effusion and associated right basilar heterogeneous airspace opacification appears to be new from prior. No pneumothorax. Status post reverse total left shoulder arthroplasty and standard total right shoulder arthroplasty. IMPRESSION: 1. Small to moderate right pleural effusion and associated right basilar heterogeneous airspace opacification appears to be new from prior. This may represent atelectasis or pneumonia. 2. Mild bilateral interstitial thickening is actually improved from 10/03/2022 and 06/20/2022 and may represent the patient's normal baseline, mild chronic fibrosis. Electronically Signed   By: Bertina Broccoli M.D.   On: 10/13/2023 16:32    Cardiac Studies     Patient Profile     83 y.o. 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  Do agree that her elevated troponin is being mismatch.  Will continue to monitor the patient closely and recommend as appropriately. Echo is pending further recommendations post echo if there is a significant change. She will benefit from thoracocentesis and thing of her fluid.      For questions or  updates, please contact [facility] Please consult [email or web address] for contact info under Cardiology/STEMI.      Signed, Latrish Mogel, DO  10/14/2023, 10:03 AM

## 2023-10-14 NOTE — Plan of Care (Signed)

## 2023-10-14 NOTE — Progress Notes (Signed)
 Progress Note   Patient: [name] ZOX:[id number] DOB: 1941/04/20 DOA: 10/13/2023     1 DOS: the patient was seen and examined on 10/14/2023   Brief hospital course: The patient is a 83 yr old woman who presented to the ED on 10/13/2023 with complaints of severe coughing and occasional hemoptysis going on for several months. The patient states that she has been followed by her PCP through this and that she has gone through several rounds of antibiotics for it. In addition to the coughing she has been having chest pain with swallowing, globus sensation in the throat and shortness of breath since January.   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 Her INR was found to be 3.2 this morning. Her coumadin  has been held since 10/13/2023, and she has been given 2.5 mg of vitamin K today. Goal is INR of <1.5.   She has been found to have a large mass in her chest. It is described as a subcarinal soft tissue mass 3.2 x 5.6 with mass effect on the esophagus which is deviated to the left. There is also complete consolidation with volume loss in the right lower lobe with opacification of the bronchus intermedius and the right lower lobe bronchus. This is possibly due to a central obstructing neoplasm.  GI and PCCM consults have been placed. Plan is for bronchoscopy on 10/16/2023. He is on a clear liquid diet. GI has been consulted.  Cardiology has also been consulted due to an elevated troponin of 1600 on admission. This has since downtrended. Cardiology feels that this is due to demand ischemia type II MI. 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 Her chest pain is related to eating. Echocardiogram is pending.  Assessment and Plan:  Large intrathoracic soft tissue mass Suspected obstructing neoplasm in right lower lobe ?  Postobstructive pneumonia - Empirically  treated with broad-spectrum antibiotics.  Leukocytosis otherwise not meeting SIRS criteria.  Saturating appropriately on room air - Further management pending Pulmonology consult. Recommendations appreciated.  - zosyn  - Sputum and blood culutre and MRSA Nares pending    Elevated troponin - Suspect demand ischemia in the setting of above, EKG non-ischemic.  - Trend troponins, EKG,  monitor on telemetry and obtain echo - Formal cardiology consult pending -- Holding anticoagulation in setting of supratherapeutic INR and possible bronch/biopsy  PAF --The patient has been continued on Metoprolol  succinate 75 mg bid.  --Coumadin  has been held due to a supertherapeutic INR of 3.2 this morning. She will receive 2.5 mg of vitamin K today in preparation for bronchoscopy/thoracentesis/EGD in coming days.   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 -Most recent echo with severely reduced ejection fraction.  Repeat echo pending as above - Resume GDMT -Pharmacy to dose warfarin, assistance appreciated    This patient was seen and examined by me. I have spent 38 minutes in her evaluation and care.     Subjective: The patient is cachectic weak and frail. She is awake, alert, and oriented x 3. Her spouse is at bedside.   Physical Exam: Vitals:   10/13/23 2246 10/14/23 0451 10/14/23 0827 10/14/23 1201  BP: (!) 148/61 135/66 (!) 152/76   Pulse: 94 96 91   Resp: 18 18 18  Temp: 98.6 F (37 C) 99 F (37.2 C) 99 F (37.2 C)   TempSrc: Oral Oral Oral   SpO2: 100% 98% 96% 95%  Weight:  42 kg    Height:  5\' 2"  (1.575 m)     Exam:  Constitutional:  The patient is awake, alert, and oriented x 3. No acute distress. She is complaining of feeling weak. Respiratory:  No increased work of breathing. No wheezes, rales, or rhonchi No tactile  fremitus Cardiovascular:  Regular rate and rhythm No murmurs, ectopy, or gallups. No lateral PMI. No thrills. Abdomen:  Abdomen is soft, non-tender, non-distended No hernias, masses, or organomegaly Normoactive bowel sounds.  Musculoskeletal:  No cyanosis, clubbing, or edema Cachectic Skin:  No rashes, lesions, ulcers palpation of skin: no induration or nodules Neurologic:  CN 2-12 intact Sensation all 4 extremities intact Psychiatric:  Mental status Mood, affect appropriate Orientation to person, place, time  judgment and insight appear intact  Data Reviewed:  CTA chest  Family Communication: I have discussed the patient in detail with the spouse who is at bedside. All questions answered to the best of my ability.  Disposition: Status is: Inpatient Remains inpatient appropriate because: Pt with large intrathoracic mass with hemoptysis and dysphagia. She requires a thorough and urgent work up. Furthermore the patient has non-obstructive CAD and severe combined CHF.   Planned Discharge Destination: tbd    Time spent: 38 minutes  Author: Josias Tomerlin, DO 10/14/2023 2:54 PM  For on call review [email or web address].

## 2023-10-15 ENCOUNTER — Telehealth: Payer: Self-pay | Admitting: Neurology

## 2023-10-15 DIAGNOSIS — R918 Other nonspecific abnormal finding of lung field: Secondary | ICD-10-CM | POA: Diagnosis not present

## 2023-10-15 DIAGNOSIS — R042 Hemoptysis: Secondary | ICD-10-CM | POA: Diagnosis not present

## 2023-10-15 DIAGNOSIS — M7989 Other specified soft tissue disorders: Secondary | ICD-10-CM | POA: Diagnosis not present

## 2023-10-15 DIAGNOSIS — R0600 Dyspnea, unspecified: Secondary | ICD-10-CM | POA: Diagnosis not present

## 2023-10-15 DIAGNOSIS — R7989 Other specified abnormal findings of blood chemistry: Secondary | ICD-10-CM | POA: Diagnosis not present

## 2023-10-15 LAB — BASIC METABOLIC PANEL WITH GFR
Anion gap: 15 (ref 5–15)
BUN: 24 mg/dL — ABNORMAL HIGH (ref 8–23)
CO2: 21 mmol/L — ABNORMAL LOW (ref 22–32)
Calcium: 9.3 mg/dL (ref 8.9–10.3)
Chloride: 96 mmol/L — ABNORMAL LOW (ref 98–111)
Creatinine, Ser: 2.01 mg/dL — ABNORMAL HIGH (ref 0.44–1.00)
GFR, Estimated: 24 mL/min — ABNORMAL LOW (ref >60)
Glucose, Bld: 80 mg/dL (ref 70–99)
Potassium: 3.9 mmol/L (ref 3.5–5.1)
Sodium: 132 mmol/L — ABNORMAL LOW (ref 135–145)

## 2023-10-15 LAB — PROTIME-INR
INR: 1.3 — ABNORMAL HIGH (ref 0.8–1.2)
Prothrombin Time: 16 s — ABNORMAL HIGH (ref 11.4–15.2)

## 2023-10-15 LAB — CBC
HCT: 27.9 % — ABNORMAL LOW (ref 36.0–46.0)
Hemoglobin: 9.2 g/dL — ABNORMAL LOW (ref 12.0–15.0)
MCH: 30.3 pg (ref 26.0–34.0)
MCHC: 33 g/dL (ref 30.0–36.0)
MCV: 91.8 fL (ref 80.0–100.0)
Platelets: 309 10*3/uL (ref 150–400)
RBC: 3.04 MIL/uL — ABNORMAL LOW (ref 3.87–5.11)
RDW: 14.6 % (ref 11.5–15.5)
WBC: 14.2 10*3/uL — ABNORMAL HIGH (ref 4.0–10.5)
nRBC: 0 % (ref 0.0–0.2)

## 2023-10-15 LAB — HEPARIN LEVEL (UNFRACTIONATED): Heparin Unfractionated: 0.13 [IU]/mL — ABNORMAL LOW (ref 0.30–0.70)

## 2023-10-15 MED ORDER — HEPARIN (PORCINE) 25000 UT/250ML-% IV SOLN
750.0000 [IU]/h | INTRAVENOUS | Status: AC
  Administered 2023-10-15: 600 [IU]/h via INTRAVENOUS
  Filled 2023-10-15: qty 250

## 2023-10-15 MED ORDER — FUROSEMIDE 20 MG PO TABS
20.0000 mg | ORAL_TABLET | Freq: Every day | ORAL | Status: DC
  Administered 2023-10-17: 20 mg via ORAL
  Filled 2023-10-15: qty 1

## 2023-10-15 MED ORDER — HEPARIN BOLUS VIA INFUSION
1500.0000 [IU] | Freq: Once | INTRAVENOUS | Status: AC
  Administered 2023-10-15: 1500 [IU] via INTRAVENOUS
  Filled 2023-10-15: qty 1500

## 2023-10-15 MED ORDER — HEPARIN BOLUS VIA INFUSION
1200.0000 [IU] | Freq: Once | INTRAVENOUS | Status: AC
  Administered 2023-10-15: 1200 [IU] via INTRAVENOUS
  Filled 2023-10-15: qty 1200

## 2023-10-15 NOTE — Telephone Encounter (Signed)
 request to cancel appointment Spouse reports pt in hospital

## 2023-10-15 NOTE — Progress Notes (Signed)
 Progress Note  Patient Name: [name] Date of Encounter: 10/15/2023  Primary Cardiologist: Lauro Portal, MD   Subjective   Patient seen examined her bedside.  Inpatient Medications    Scheduled Meds:  budesonide-glycopyrrolate -formoterol  2 puff Inhalation BID   buPROPion   150 mg Oral q AM   furosemide   20 mg Oral BID   icosapent  Ethyl  1 g Oral BID   irbesartan  150 mg Oral Daily   levothyroxine   75 mcg Oral Q0600   metoprolol  succinate  25 mg Oral BH-q7a   And   metoprolol  succinate  50 mg Oral QPM   pantoprazole  (PROTONIX ) IV  40 mg Intravenous QHS   pravastatin   40 mg Oral QHS   senna  30 mg Oral QHS   spironolactone   25 mg Oral Daily   Warfarin - Pharmacist Dosing Inpatient   Does not apply q1600   Continuous Infusions:  piperacillin-tazobactam (ZOSYN)  IV 3.375 g (10/15/23 0402)   PRN Meds: acetaminophen , albuterol , ALPRAZolam , antiseptic oral rinse, metoCLOPramide , traMADol , traZODone    Vital Signs    Vitals:   10/14/23 1944 10/14/23 2104 10/15/23 0510 10/15/23 0820  BP:  132/71 (!) 141/71 (!) 155/75  Pulse:  86 82 (!) 110  Resp:  18 18   Temp:  97.9 F (36.6 C) 98.2 F (36.8 C)   TempSrc:  Oral Oral   SpO2: 95% 99% 95% 94%  Weight:      Height:        Intake/Output Summary (Last 24 hours) at 10/15/2023 1131 Last data filed at 10/15/2023 4098 Gross per 24 hour  Intake 412.88 ml  Output --  Net 412.88 ml   Filed Weights   10/14/23 0451  Weight: 42 kg    Telemetry     - Personally Reviewed  ECG     - Personally Reviewed  Physical Exam     General: Comfortable, Head: Atraumatic, normal size  Eyes: PEERLA, EOMI  Neck: Supple, normal JVD Cardiac: Normal S1, S2; RRR; no murmurs, rubs, or gallops Lungs: Clear to auscultation bilaterally Abd: Soft, nontender, no hepatomegaly  Ext: warm, no edema Musculoskeletal: No deformities, BUE and BLE strength normal and equal Skin: Warm and dry, no rashes   Neuro: Alert and  oriented to person, place, time, and situation, CNII-XII grossly intact, no focal deficits  Psych: Normal mood and affect   Labs    Chemistry Recent Labs  Lab 10/13/23 1513 10/14/23 0617 10/15/23 0725  NA 134* 134* 132*  K 3.7 4.1 3.9  CL 98 100 96*  CO2 26 22 21*  GLUCOSE 136* 97 80  BUN 16 16 24*  CREATININE 1.00 1.22* 2.01*  CALCIUM  10.2 9.9 9.3  PROT  --  6.0*  --   ALBUMIN  --  2.4*  --   AST  --  30  --   ALT  --  20  --   ALKPHOS  --  74  --   BILITOT  --  0.7  --   GFRNONAA 57* 45* 24*  ANIONGAP 10 12 15      Hematology Recent Labs  Lab 10/13/23 1513 10/14/23 0617 10/15/23 0725  WBC 16.8* 15.3* 14.2*  RBC 3.43* 3.14* 3.04*  HGB 10.5* 9.5* 9.2*  HCT 33.2* 29.0* 27.9*  MCV 96.8 92.4 91.8  MCH 30.6 30.3 30.3  MCHC 31.6 32.8 33.0  RDW 14.4 14.6 14.6  PLT 340 326 309    Cardiac EnzymesNo results for input(s): "TROPONINI" in the last  168 hours. No results for input(s): "TROPIPOC" in the last 168 hours.   BNP Recent Labs  Lab 10/13/23 1755  BNP 580.4*     DDimer No results for input(s): "DDIMER" in the last 168 hours.   Radiology    ECHOCARDIOGRAM COMPLETE Result Date: 10/14/2023    ECHOCARDIOGRAM REPORT   Patient Name:   [name] Date of Exam: 10/14/2023 Medical Rec #:  [id number]           Height:       62.0 in Accession #:    [id number]          Weight:       92.6 lb Date of Birth:  [birth year]            BSA:          1.378 m Patient Age:    83 years            BP:           135/66 mmHg Patient Gender: F                   HR:           90 bpm. Exam Location:  Inpatient Procedure: 2D Echo (Both Spectral and Color Flow Doppler were utilized during            procedure). Indications:    Chest Pain R07.9  History:        Patient has prior history of Echocardiogram examinations, most                 recent 02/20/2023. CHF, COPD, Mitral Valve Disease; Risk                 Factors:Hypertension.                  Mitral Valve: 25 mm St. [name] valve is  present in the mitral                 position. Procedure Date: 02/26/2006.  Sonographer:    Astrid Blamer Referring Phys: [id number] MARISSA C KRUGH IMPRESSIONS  1. Left ventricular ejection fraction, by estimation, is 25%. The left ventricle has severely decreased function. The left ventricle demonstrates global hypokinesis. The left ventricular internal cavity size was moderately dilated. Left ventricular diastolic parameters are indeterminate.  2. Right ventricular systolic function is mildly reduced. The right ventricular size is normal. Tricuspid regurgitation signal is inadequate for assessing PA pressure.  3. The mitral valve has been repaired/replaced. Trivial mitral valve regurgitation likely normal washing jet. The mean mitral valve gradient is 4.0 mmHg with average heart rate of 86 bpm. There is a 25 mm St. [name] present in the mitral position. Procedure Date: 02/26/2006. Echo findings are consistent with normal structure and function of the mitral valve prosthesis.  4. The aortic valve is tricuspid. There is mild calcification of the aortic valve. Aortic valve regurgitation is mild. No aortic stenosis is present.  5. The inferior vena cava is dilated in size with <50% respiratory variability, suggesting right atrial pressure of 15 mmHg. FINDINGS  Left Ventricle: Left ventricular ejection fraction, by estimation, is 25%. The left ventricle has severely decreased function. The left ventricle demonstrates global hypokinesis. The left ventricular internal cavity size was moderately dilated. There is  no left ventricular hypertrophy. Left ventricular diastolic parameters are indeterminate. Right Ventricle: The right ventricular size is normal. No increase in right ventricular wall thickness. Right ventricular systolic function  is mildly reduced. Tricuspid regurgitation signal is inadequate for assessing PA pressure. Left Atrium: Left atrial size was normal in size. Right Atrium: Right atrial size was normal in  size. Pericardium: There is no evidence of pericardial effusion. Mitral Valve: The mitral valve has been repaired/replaced. Trivial mitral valve regurgitation. There is a 25 mm St. [name] present in the mitral position. Procedure Date: 02/26/2006. Echo findings are consistent with normal structure and function of the mitral valve prosthesis. MV peak gradient, 6.8 mmHg. The mean mitral valve gradient is 4.0 mmHg with average heart rate of 86 bpm. Tricuspid Valve: The tricuspid valve is normal in structure. Tricuspid valve regurgitation is trivial. No evidence of tricuspid stenosis. Aortic Valve: The aortic valve is tricuspid. There is mild calcification of the aortic valve. Aortic valve regurgitation is mild. Aortic regurgitation PHT measures 562 msec. No aortic stenosis is present. Aortic valve mean gradient measures 4.0 mmHg. Aortic valve peak gradient measures 6.5 mmHg. Aortic valve area, by VTI measures 2.17 cm. Pulmonic Valve: The pulmonic valve was grossly normal. Pulmonic valve regurgitation is trivial. No evidence of pulmonic stenosis. Aorta: The aortic root is normal in size and structure. Venous: The inferior vena cava is dilated in size with less than 50% respiratory variability, suggesting right atrial pressure of 15 mmHg. IAS/Shunts: No atrial level shunt detected by color flow Doppler.  LEFT VENTRICLE PLAX 2D LVIDd:         4.80 cm LVIDs:         4.00 cm LV PW:         0.90 cm LV IVS:        0.90 cm LVOT diam:     1.90 cm LV SV:         51 LV SV Index:   37 LVOT Area:     2.84 cm  RIGHT VENTRICLE TAPSE (M-mode): 1.0 cm LEFT ATRIUM             Index        RIGHT ATRIUM          Index LA Vol (A2C):   34.4 ml 24.96 ml/m  RA Area:     5.91 cm LA Vol (A4C):   38.5 ml 27.94 ml/m  RA Volume:   8.12 ml  5.89 ml/m LA Biplane Vol: 37.0 ml 26.85 ml/m  AORTIC VALVE AV Area (Vmax):    2.37 cm AV Area (Vmean):   1.95 cm AV Area (VTI):     2.17 cm AV Vmax:           127.00 cm/s AV Vmean:          103.000 cm/s  AV VTI:            0.234 m AV Peak Grad:      6.5 mmHg AV Mean Grad:      4.0 mmHg LVOT Vmax:         106.00 cm/s LVOT Vmean:        70.700 cm/s LVOT VTI:          0.179 m LVOT/AV VTI ratio: 0.76 AI PHT:            562 msec  AORTA Ao Root diam: 3.10 cm MITRAL VALVE MV Area (PHT): 3.65 cm     SHUNTS MV Area VTI:   1.84 cm     Systemic VTI:  0.18 m MV Peak grad:  6.8 mmHg     Systemic Diam: 1.90 cm MV Mean grad:  4.0 mmHg  MV Vmax:       1.31 m/s MV Vmean:      96.5 cm/s MV Decel Time: 208 msec MV E velocity: 126.00 cm/s MV A velocity: 128.00 cm/s MV E/A ratio:  0.98 Grady Lawman MD Electronically signed by Grady Lawman MD Signature Date/Time: 10/14/2023/1:19:06 PM    Final    CT Soft Tissue Neck W Contrast Result Date: 10/13/2023 CLINICAL DATA:  Stridor and chest pain EXAM: CT NECK WITH CONTRAST TECHNIQUE: Multidetector CT imaging of the neck was performed using the standard protocol following the bolus administration of intravenous contrast. RADIATION DOSE REDUCTION: This exam was performed according to the departmental dose-optimization program which includes automated exposure control, adjustment of the mA and/or kV according to patient size and/or use of iterative reconstruction technique. CONTRAST:  75mL OMNIPAQUE  IOHEXOL  350 MG/ML SOLN COMPARISON:  None Available. FINDINGS: Pharynx and larynx: Normal. No mass or swelling. Salivary glands: No inflammation, mass, or stone. Thyroid: Normal Lymph nodes: None enlarged or abnormal density. Vascular: Numerous small caliber collaterals surrounding the spinal column. Major vessels are patent. Calcific aortic atherosclerosis. Limited intracranial: Normal Visualized orbits: Normal Mastoids and visualized paranasal sinuses: Clear Skeleton: Negative Upper chest: Biapical emphysema. Other: Patulous debris containing lower cervical and upper thoracic esophagus. IMPRESSION: 1. Patulous debris containing lower cervical and upper thoracic esophagus. 2. No acute  abnormality of the neck. Aortic Atherosclerosis (ICD10-I70.0) and Emphysema (ICD10-J43.9). Electronically Signed   By: Juanetta Nordmann M.D.   On: 10/13/2023 19:45   CT Angio Chest PE W and/or Wo Contrast Result Date: 10/13/2023 CLINICAL DATA:  Chest discomfort, difficulty breathing, stridor, throat tightness EXAM: CT ANGIOGRAPHY CHEST WITH CONTRAST TECHNIQUE: Multidetector CT imaging of the chest was performed using the standard protocol during bolus administration of intravenous contrast. Multiplanar CT image reconstructions and MIPs were obtained to evaluate the vascular anatomy. RADIATION DOSE REDUCTION: This exam was performed according to the departmental dose-optimization program which includes automated exposure control, adjustment of the mA and/or kV according to patient size and/or use of iterative reconstruction technique. CONTRAST:  75mL OMNIPAQUE  IOHEXOL  350 MG/ML SOLN COMPARISON:  10/13/2023, 06/22/2022 FINDINGS: Cardiovascular: This is a technically adequate evaluation of the pulmonary vasculature. No filling defects or pulmonary emboli. The heart is enlarged, with marked left ventricular dilatation. Multi lead cardiac pacer again identified. No evidence of thoracic aortic aneurysm or dissection. Atherosclerosis throughout the aorta and coronary vasculature. 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 Mediastinum/Nodes: Subcarinal soft tissue mass measures up to 3.2 x 5.6 cm, reference image 63/4. This has mass effect upon the midthoracic esophagus, which is deviated to the left. This results in proximal esophageal dilatation and retained secretions. The trachea appears unremarkable. Thyroid is not well visualized. Lungs/Pleura: There is complete consolidation with volume loss in the right lower lobe. There is opacification of the bronchus intermedius and right lower  lobe bronchus. Central obstructing neoplasm cannot be excluded. Upper lobe predominant emphysema. Trace right pleural effusion. No pneumothorax. Upper Abdomen: Bilateral adrenal nodules, measuring 14 mm on the right and 10 mm on the left. No other acute upper abdominal findings. Musculoskeletal: No acute or destructive bony abnormalities. Bilateral shoulder arthroplasties. Reconstructed images demonstrate no additional findings. Review of the MIP images confirms the above findings. IMPRESSION: 1. Large soft tissue mass in the subcarinal region, with mass effect upon the midthoracic esophagus. Findings compatible with malignancy or adenopathy. 2. Dense consolidation of the  right lower lobe with volume loss, as well as opacification of the bronchus intermedius and right lower lobe bronchial tree. Central obstructing neoplasm is suspected. Bronchoscopy may be useful. 3. Trace right pleural effusion. 4. No evidence of pulmonary embolus. 5. Nonspecific bilateral adrenal nodularity, metastatic disease not excluded in light of intrathoracic findings. 6. Cardiomegaly with prominent left ventricular dilatation. 7. Aortic Atherosclerosis (ICD10-I70.0) and Emphysema (ICD10-J43.9). Electronically Signed   By: Bobbye Burrow M.D.   On: 10/13/2023 18:51   DG Chest 2 View Result Date: 10/13/2023 CLINICAL DATA:  Shortness of breath.  Wheezing. EXAM: CHEST - 2 VIEW COMPARISON:  Chest radiographs 10/03/2022, 06/20/2022, 11/18/2021 FINDINGS: Status post median sternotomy and mitral valve prosthesis, unchanged. Mild high-grade atherosclerotic calcifications within the aortic arch. Left chest wall AICD with leads overlying the right atrium, right antrum ventricle, and coronary sinus. Cardiac silhouette is again at the upper limits of normal size. Mediastinal contours are within normal limits. Mild bilateral interstitial thickening is actually improved from 10/03/2022 and 06/20/2022 and may represent the patient's normal baseline,  mild chronic fibrosis. Small to moderate right pleural effusion and associated right basilar heterogeneous airspace opacification appears to be new from prior. No pneumothorax. Status post reverse total left shoulder arthroplasty and standard total right shoulder arthroplasty. IMPRESSION: 1. Small to moderate right pleural effusion and associated right basilar heterogeneous airspace opacification appears to be new from prior. This may represent atelectasis or pneumonia. 2. Mild bilateral interstitial thickening is actually improved from 10/03/2022 and 06/20/2022 and may represent the patient's normal baseline, mild chronic fibrosis. Electronically Signed   By: Bertina Broccoli M.D.   On: 10/13/2023 16:32    Cardiac Studies     Patient Profile     83 y.o. 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  Do agree that her elevated troponin is being mismatch.  Will continue to monitor the patient closely and recommend as appropriately. Echo with no significant change from prior Ok to bridge with heparin  gtt for her upcoming diagnostic procedure.       For questions or updates, please contact [facility] Please consult [email or web address] for contact info under Cardiology/STEMI.      Signed, Aralyn Nowak, DO  10/15/2023, 11:31 AM

## 2023-10-15 NOTE — Progress Notes (Addendum)
 Progress Note   Patient: [name]:[id number] DOB: 29-Apr-1941 DOA: 10/13/2023     2 DOS: the patient was seen and examined on 10/15/2023   Brief hospital course: The patient is a 83 yr old woman who presented to the ED on 10/13/2023 with complaints of severe coughing and occasional hemoptysis going on for several months. The patient states that she has been followed by her PCP through this and that she has gone through several rounds of antibiotics for it. In addition to the coughing she has been having chest pain with swallowing, globus sensation in the throat and shortness of breath since January.   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 Her INR was found to be 3.2 this morning. Her coumadin  has been held since 10/13/2023, and she has been given 2.5 mg of vitamin K today. Goal is INR of <1.5.   She has been found to have a large mass in her chest. It is described as a subcarinal soft tissue mass 3.2 x 5.6 with mass effect on the esophagus which is deviated to the left. There is also complete consolidation with volume loss in the right lower lobe with opacification of the bronchus intermedius and the right lower lobe bronchus. This is possibly due to a central obstructing neoplasm.  GI and PCCM consults have been placed. Plan is for bronchoscopy on 10/16/2023. He is on a clear liquid diet. GI has been consulted.  Cardiology has also been consulted due to an elevated troponin of 1600 on admission. This has since downtrended. Cardiology feels that this is due to demand ischemia type II MI. 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 Her chest pain is related to eating. Echocardiogram has demonstrated EF of 25%, global hypokinesis with a moderately dilated LV. Mildly reduced RV function. Prosthetic MV with normal structure and function. It is relatively  unchanged from previous. Cardiology is comfortable that her elevated troponin is not ischemic in nature.  Assessment and Plan:  Large intrathoracic soft tissue mass Suspected obstructing neoplasm in right lower lobe ?  Postobstructive pneumonia - Empirically treated with broad-spectrum antibiotics.  Leukocytosis otherwise not meeting SIRS criteria.  Saturating appropriately on room air - Further management pending Pulmonology consult. Recommendations appreciated.  - zosyn  - Sputum and blood culutre and MRSA Nares pending  -- Plan is for bronchoscopy tomorrow. INR is 1.3 today.    Elevated troponin - Suspect demand ischemia in the setting of above, EKG non-ischemic.  - Trend troponins, EKG,  monitor on telemetry and obtain echo - Formal cardiology consult pending -- Holding anticoagulation in setting of supratherapeutic INR and possible bronch/biopsy  AKI --Creatinine has increased from 1.2 to 2.0. Have reduced lasix  to 20 mg daily and stopped irbesartan and spironolactone .  PAF --The patient has been continued on Metoprolol  succinate 75 mg bid.  --Coumadin  has been held due to a supertherapeutic INR of 1.3 this morning. She received 2.5 mg of vitamin K today in preparation for bronchoscopy/thoracentesis/EGD in coming days.  -- Will use heparin  to bridge the patient for procedure tomorrow.  Dysphagia -- Patient is tolerating a clear liquid diet well. Will hold off on trying to advance her diet until after bronchoscopy.   Combined systolic and diastolic heart failure s/p ICD CAD  PAF  Mitral valve prolapse status post replacement on Coumadin  Supratherapeutic INR - Appears euvolemic,  hemoglobin stable. -Most recent echo with severely reduced ejection fraction.  Repeat echo pending as above - Resume GDMT -Pharmacy to dose warfarin, assistance appreciated    This patient was seen and examined by me. I have spent 35 minutes in her evaluation and care.     Subjective: The  patient is cachectic weak and frail. She is awake, alert, and oriented x 3. Her spouse is at bedside.   Physical Exam: Vitals:   10/14/23 1944 10/14/23 2104 10/15/23 0510 10/15/23 0820  BP:  132/71 (!) 141/71 (!) 155/75  Pulse:  86 82 (!) 110  Resp:  18 18   Temp:  97.9 F (36.6 C) 98.2 F (36.8 C)   TempSrc:  Oral Oral   SpO2: 95% 99% 95% 94%  Weight:      Height:       Exam:  Constitutional:  The patient is awake, alert, and oriented x 3. No acute distress. She is complaining of feeling weak. Respiratory:  No increased work of breathing. No wheezes, rales, or rhonchi No tactile fremitus Cardiovascular:  Regular rate and rhythm No murmurs, ectopy, or gallups. No lateral PMI. No thrills. Abdomen:  Abdomen is soft, non-tender, non-distended No hernias, masses, or organomegaly Normoactive bowel sounds.  Musculoskeletal:  No cyanosis, clubbing, or edema Cachectic Skin:  No rashes, lesions, ulcers palpation of skin: no induration or nodules Neurologic:  CN 2-12 intact Sensation all 4 extremities intact Psychiatric:  Mental status Mood, affect appropriate Orientation to person, place, time  judgment and insight appear intact  Data Reviewed:  CTA chest  Family Communication:None available today.  Disposition: Status is: Inpatient Remains inpatient appropriate because: Pt with large intrathoracic mass with hemoptysis and dysphagia. She requires a thorough and urgent work up. Furthermore the patient has non-obstructive CAD and severe combined CHF.   Planned Discharge Destination: tbd    Time spent: 32 minutes  Author: Aara Jacquot, DO 10/15/2023 2:51 PM  For on call review [email or web address].

## 2023-10-15 NOTE — Progress Notes (Addendum)
 NAME:  [name], MRN:  [id number], DOB:  [birth year], LOS: 2 ADMISSION DATE:  10/13/2023, CONSULTATION DATE: 10/14/2023 REFERRING MD: [facility], CHIEF COMPLAINT: Hemoptysis shortness of breath  History of Present Illness:  83 year old female quit smoking 20 years ago with extensive past medical history in the areas of cardiology and orthopedics with 2 shoulder replacements and a pacemaker implantation.  CT scan reveals a large esophageal mass and right lower lobe opacification.  She has shortness of breath with any activity and with eating.  She has intermittent periods of hemoptysis with daily sputum production that is either clear or bloody.  Pulmonary critical care asked to evaluate.  Pertinent  Medical History   Past Medical History:  Diagnosis Date   Allergy    seasonal   Anemia    Anxiety    Arthritis    Asthma    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 Protecta   Biventricular ICD (implantable cardioverter-defibrillator) in place    with CRT   Blood transfusion without reported diagnosis    Bursitis    Cataract    RIGHT EYE   CHF (congestive heart failure) (HCC)    Chronic kidney disease    Colon polyp    adenomatous   Complication of anesthesia    patient stated that had difficulty getting the breathing tube removed, patient said that she stopped breathing and HR dropped to 10  patient then woke up and started breathing pateint stated no longer than one minute; re-intubated in PACU following cholecystectomy 10/28/13   COPD (chronic obstructive pulmonary disease) (HCC)    Depression    Diverticulosis    Dysrhythmia    Fibromyalgia    GERD (gastroesophageal reflux disease)    Gout    H/O mitral valve replacement 2002, 2007   Heart murmur    Hemorrhoids    Hyperlipidemia    Hypertension    Hypothyroidism    IBS (irritable bowel syndrome)    MVP (mitral valve prolapse)    NAUSEA AND VOMITING 08/01/2009   Neuromuscular disorder (HCC)     fibromyalgia   Pacemaker    Peptic ulcer disease      Significant Hospital Events: Including procedures, antibiotic start and stop dates in addition to other pertinent events     Interim History / Subjective:  Appears to have short-term memory loss.  INR is pending for today  Objective    Blood pressure (!) 141/71, pulse 82, temperature 98.2 F (36.8 C), temperature source Oral, resp. rate 18, height 5\' 2"  (1.575 m), weight 42 kg, SpO2 95%.        Intake/Output Summary (Last 24 hours) at 10/15/2023 6962 Last data filed at 10/14/2023 1700 Gross per 24 hour  Intake 311.23 ml  Output --  Net 311.23 ml   Filed Weights   10/14/23 0451  Weight: 42 kg    Examination: Frail cachectic female in no acute distress.  She demonstrates no memory of having been interviewed by me before.  Nor does she remember the plan for bronchoscopy on 10/16/2023 No JVD or lymphadenopathy is appreciated Heart sounds are regular with murmur Abdomen soft nontender Extremities are warm with muscle wasting  Resolved problem list   Assessment and Plan  Shortness of breath, hemoptysis x 1 inability to eat secondary to shortness of breath large soft tissue mass lower esophagus and near opacification of the right lower lobe with increasing shortness of breath intermittent hemoptysis.  History of mitral valve replacement with mechanical valve  and on Coumadin  normalized. 83 year old female has a plethora of health issues extensive cardiac history that includes a pacemaker who now has near opacification of the right lower lobe and soft tissue mass distal esophagus and question for the pulmonary team is diagnostic intervention.  Plan for diagnostic fiberoptic bronchoscopy 10/16/2023 if her INR allows Clear liquid diet up until 12 midnight tonight when she becomes n.p.o.  All other issues per primary  Best Practice (right click and "Reselect all SmartList Selections" daily)   Diet/type: Regular consistency  (see orders) DVT prophylaxis prophylactic heparin  coumadin  on hold Pressure ulcer(s): N/A GI prophylaxis: PPI Lines: N/A Foley:  N/A Code Status:  full code Last date of multidisciplinary goals of care discussion [tbd]  Labs   CBC: Recent Labs  Lab 10/13/23 1513 10/14/23 0617  WBC 16.8* 15.3*  HGB 10.5* 9.5*  HCT 33.2* 29.0*  MCV 96.8 92.4  PLT 340 326    Basic Metabolic Panel: Recent Labs  Lab 10/13/23 1513 10/14/23 0617  NA 134* 134*  K 3.7 4.1  CL 98 100  CO2 26 22  GLUCOSE 136* 97  BUN 16 16  CREATININE 1.00 1.22*  CALCIUM  10.2 9.9   GFR: Estimated Creatinine Clearance: 24 mL/min (A) (by C-G formula based on SCr of 1.22 mg/dL (H)). Recent Labs  Lab 10/13/23 1513 10/13/23 1755 10/14/23 0617  WBC 16.8*  --  15.3*  LATICACIDVEN  --  1.0  --     Liver Function Tests: Recent Labs  Lab 10/14/23 0617  AST 30  ALT 20  ALKPHOS 74  BILITOT 0.7  PROT 6.0*  ALBUMIN 2.4*   No results for input(s): "LIPASE", "AMYLASE" in the last 168 hours. No results for input(s): "AMMONIA" in the last 168 hours.  ABG    Component Value Date/Time   PHART 7.416 10/08/2022 1128   PCO2ART 42.5 10/08/2022 1128   PO2ART 70 (L) 10/08/2022 1128   HCO3 27.3 10/08/2022 1128   TCO2 29 10/08/2022 1128   ACIDBASEDEF 2.0 06/20/2022 1619   O2SAT 94 10/08/2022 1128     Coagulation Profile: Recent Labs  Lab 10/13/23 1447 10/14/23 0617  INR 4.4* 3.2*    Cardiac Enzymes: No results for input(s): "CKTOTAL", "CKMB", "CKMBINDEX", "TROPONINI" in the last 168 hours.  HbA1C: No results found for: "HGBA1C"  CBG: No results for input(s): "GLUCAP" in the last 168 hours.    Siegfried Dress Minor ACNP Acute Care Nurse Practitioner [facility] Pulmonary/Critical Care Please consult Amion 10/15/2023, 8:08 AM    Patient seen, independently examined, care plan was formulated and discussed with Sammie Crigler mine as per documentation  Patient will be scheduled for bronchoscopy at 0930 hrs.  10/16/2023 for EBUS bronchoscopy, biopsy of endobronchial lesion  Discussed with patient and spouse at bedside  Questions answered  Myer Artis, MD [facility] Pager: See Tilford Foley

## 2023-10-15 NOTE — Progress Notes (Signed)
 PHARMACY - ANTICOAGULATION CONSULT NOTE  Pharmacy Consult for Warfarin > heparin  bridge Indication: hx mechanical mitral valve  Allergies  Allergen Reactions   Ace Inhibitors Cough   Lipitor [Atorvastatin Calcium ] Other (See Comments)    Knee pain    Megace [Megestrol] Other (See Comments)    High blood pressure and blurred vision   Aspirin  Other (See Comments)    Nephrologist said to not take this Update 10/07/22 - patient clarifies this recommendation was lumped in with a whole bunch of other medications at that time many years ago when nephrologist told her to stay away from Tylenol , vitamins, ASA. Denies prior allergy or allergic reaction to this   Clorazepate Dipotassium Other (See Comments)    Interacts with a drug being taken   Simvastatin Other (See Comments)    Muscle pain   Tiotropium Bromide Monohydrate Other (See Comments)    Dry mouth   Codeine Nausea And Vomiting   Tape Rash    Patient Measurements: Height: 5\' 2"  (157.5 cm) Weight: 42 kg (92 lb 9.5 oz) IBW/kg (Calculated) : 50.1 HEPARIN  DW (KG): 42  Vital Signs: Temp: 97.9 F (36.6 C) (05/15 2030) Temp Source: Oral (05/15 2030) BP: 116/63 (05/15 2030) Pulse Rate: 79 (05/15 2030)  Labs: Recent Labs    10/13/23 1447 10/13/23 1513 10/13/23 1513 10/13/23 1755 10/13/23 2058 10/14/23 0617 10/15/23 0725 10/15/23 1932  HGB  --  10.5*   < >  --   --  9.5* 9.2*  --   HCT  --  33.2*  --   --   --  29.0* 27.9*  --   PLT  --  340  --   --   --  326 309  --   LABPROT  --   --   --   --   --  33.0* 16.0*  --   INR 4.4*  --   --   --   --  3.2* 1.3*  --   HEPARINUNFRC  --   --   --   --   --   --   --  0.13*  CREATININE  --  1.00  --   --   --  1.22* 2.01*  --   TROPONINIHS  --   --   --  1,614* 1,306*  --   --   --    < > = values in this interval not displayed.    Estimated Creatinine Clearance: 14.6 mL/min (A) (by C-G formula based on SCr of 2.01 mg/dL (H)).  Assessment: 24 yof with a history of CAD, HF,  s/p ICD, mitral valve prolapse s/p MVR (2007) and PAF, on warfarin PTA. Patient is presenting with SOB. Warfarin per pharmacy consult placed for mechanical valve.  INR 4.4 on admit and warfarin held.  CCM/Pulmonary consulted 5/14 for mediastinal mass.  INR 3.2 on 5/14 and Vitamin K 2.5 mg IV given >INR down to 1.3 today.  Bronchoscopy is now scheduled for 5/16 at 0930.  To begin heparin  bridge.  Heparin  to be held 2 hours prior to bronchoscopy per S. Minor, NP.    PTA warfarin regimen: 2.5 mg daily except 1.25 mg on Fridays and Sundays. Last taken 5/12 at 6pm.  PM: heparin  level is subtherapeutic at 0.13 on 600 units/hr.   Goal of Therapy:  Heparin  level 0.3-0.7 units/ml INR 2.5-3.5 Monitor platelets by anticoagulation protocol: Yes   Plan:  Heparin  1200 units IV bolus Increase heparin  drip to 750 units/hr - hold  heparin  at 0730 on 5/16 for bronch at 0930. Warfarin on hold for bronchoscopy on 5/16 am. Daily heparin  level, PT/INR and CBC. Follow up post-bronch for timing of resuming anticoagulation.  Thank you for involving pharmacy in this patient's care.  Caroline Cinnamon, PharmD, BCPS Clinical Pharmacist Clinical phone for 10/15/2023 is [phone] 10/15/2023 9:34 PM

## 2023-10-15 NOTE — Plan of Care (Signed)

## 2023-10-15 NOTE — Progress Notes (Signed)
 SLP Cancellation Note  Patient Details Name: Akilah Lukasik MRN: [id number] DOB: August 09, 1940   Cancelled treatment:        Orders for swallowing assessment received and appreciated.  Spoke with RN.  Pt with bronchoscopy planned for 5/16 and pt to remain largely NPO.  Pt has been tolerating medications with thin liquids without difficult and symptoms are seemingly esophageal in nature per chart review with new mediastinal mass.  GI consult pending and pt on clear liquid diet at this time.  SLP will reattempt when pt is cleared for more advanced POs.   Elester Grim, MA, CCC-SLP Acute Rehabilitation Services Office: [phone] 10/15/2023, 9:58 AM

## 2023-10-15 NOTE — Progress Notes (Signed)
 PHARMACY - ANTICOAGULATION CONSULT NOTE  Pharmacy Consult for Warfarin > heparin  bridge Indication: hx mechanical mitral valve  Allergies  Allergen Reactions   Ace Inhibitors Cough   Lipitor [Atorvastatin Calcium ] Other (See Comments)    Knee pain    Megace [Megestrol] Other (See Comments)    High blood pressure and blurred vision   Aspirin  Other (See Comments)    Nephrologist said to not take this Update 10/07/22 - patient clarifies this recommendation was lumped in with a whole bunch of other medications at that time many years ago when nephrologist told her to stay away from Tylenol , vitamins, ASA. Denies prior allergy or allergic reaction to this   Clorazepate Dipotassium Other (See Comments)    Interacts with a drug being taken   Simvastatin Other (See Comments)    Muscle pain   Tiotropium Bromide Monohydrate Other (See Comments)    Dry mouth   Codeine Nausea And Vomiting   Tape Rash    Patient Measurements: Height: 5\' 2"  (157.5 cm) Weight: 42 kg (92 lb 9.5 oz) IBW/kg (Calculated) : 50.1 HEPARIN  DW (KG): 42  Vital Signs: Temp: 98.2 F (36.8 C) (05/15 0510) Temp Source: Oral (05/15 0510) BP: 155/75 (05/15 0820) Pulse Rate: 110 (05/15 0820)  Labs: Recent Labs    10/13/23 1447 10/13/23 1513 10/13/23 1513 10/13/23 1755 10/13/23 2058 10/14/23 0617 10/15/23 0725  HGB  --  10.5*   < >  --   --  9.5* 9.2*  HCT  --  33.2*  --   --   --  29.0* 27.9*  PLT  --  340  --   --   --  326 309  LABPROT  --   --   --   --   --  33.0* 16.0*  INR 4.4*  --   --   --   --  3.2* 1.3*  CREATININE  --  1.00  --   --   --  1.22* 2.01*  TROPONINIHS  --   --   --  1,614* 1,306*  --   --    < > = values in this interval not displayed.    Estimated Creatinine Clearance: 14.6 mL/min (A) (by C-G formula based on SCr of 2.01 mg/dL (H)).  Assessment: 61 yof with a history of CAD, HF, s/p ICD, mitral valve prolapse s/p MVR (2007) and PAF, on warfarin PTA. Patient is presenting with SOB.  Warfarin per pharmacy consult placed for mechanical valve.  INR 4.4 on admit and warfarin held.  CCM/Pulmonary consulted 5/14 for mediastinal mass.  INR 3.2 on 5/14 and Vitamin K 2.5 mg IV given >INR down to 1.3 today.  Bronchoscopy is now scheduled for 5/16 at 0930.  To begin heparin  bridge.  Heparin  to be held 2 hours prior to bronchoscopy per S. Minor, NP.    PTA warfarin regimen: 2.5 mg daily except 1.25 mg on Fridays and Sundays. Last taken 5/12 at 6pm.  Goal of Therapy:  Heparin  level 0.3-0.7 units/ml INR 2.5-3.5 Monitor platelets by anticoagulation protocol: Yes   Plan:  Heparin  1500 units IV bolus Heparin  drip to begin at 600 units/hr > to hold heparin  at 0730 on 5/16 for bronch at 0930. Heparin  level ~8 hrs after drip begins. Warfarin on hold for bronchoscopy on 5/16 am. Daily heparin  level, PT/INR and CBC. Follow up post-bronch for timing of resuming anticoagulation.  Adolphus Akin, RPh 10/15/2023,11:49 AM

## 2023-10-16 ENCOUNTER — Inpatient Hospital Stay (HOSPITAL_COMMUNITY)

## 2023-10-16 ENCOUNTER — Encounter (HOSPITAL_COMMUNITY): Payer: Self-pay | Admitting: Emergency Medicine

## 2023-10-16 ENCOUNTER — Encounter (HOSPITAL_COMMUNITY): Admission: EM | Disposition: E | Payer: Self-pay | Source: Home / Self Care | Attending: Internal Medicine

## 2023-10-16 ENCOUNTER — Inpatient Hospital Stay (HOSPITAL_COMMUNITY): Admitting: Anesthesiology

## 2023-10-16 DIAGNOSIS — R918 Other nonspecific abnormal finding of lung field: Secondary | ICD-10-CM | POA: Diagnosis not present

## 2023-10-16 DIAGNOSIS — Z87891 Personal history of nicotine dependence: Secondary | ICD-10-CM

## 2023-10-16 DIAGNOSIS — I2510 Atherosclerotic heart disease of native coronary artery without angina pectoris: Secondary | ICD-10-CM | POA: Diagnosis not present

## 2023-10-16 DIAGNOSIS — R590 Localized enlarged lymph nodes: Secondary | ICD-10-CM | POA: Diagnosis not present

## 2023-10-16 DIAGNOSIS — R0600 Dyspnea, unspecified: Secondary | ICD-10-CM | POA: Diagnosis not present

## 2023-10-16 DIAGNOSIS — M7989 Other specified soft tissue disorders: Secondary | ICD-10-CM | POA: Diagnosis not present

## 2023-10-16 DIAGNOSIS — R7989 Other specified abnormal findings of blood chemistry: Secondary | ICD-10-CM | POA: Diagnosis not present

## 2023-10-16 DIAGNOSIS — R042 Hemoptysis: Secondary | ICD-10-CM | POA: Diagnosis not present

## 2023-10-16 HISTORY — PX: VIDEO BRONCHOSCOPY WITH ENDOBRONCHIAL ULTRASOUND: SHX6177

## 2023-10-16 HISTORY — PX: BRONCHIAL BIOPSY: SHX5109

## 2023-10-16 HISTORY — PX: BRONCHIAL WASHINGS: SHX5105

## 2023-10-16 HISTORY — PX: FINE NEEDLE ASPIRATION: SHX6590

## 2023-10-16 LAB — CBC
HCT: 26.6 % — ABNORMAL LOW (ref 36.0–46.0)
Hemoglobin: 9 g/dL — ABNORMAL LOW (ref 12.0–15.0)
MCH: 30.5 pg (ref 26.0–34.0)
MCHC: 33.8 g/dL (ref 30.0–36.0)
MCV: 90.2 fL (ref 80.0–100.0)
Platelets: 285 10*3/uL (ref 150–400)
RBC: 2.95 MIL/uL — ABNORMAL LOW (ref 3.87–5.11)
RDW: 14.5 % (ref 11.5–15.5)
WBC: 13.2 10*3/uL — ABNORMAL HIGH (ref 4.0–10.5)
nRBC: 0 % (ref 0.0–0.2)

## 2023-10-16 LAB — BASIC METABOLIC PANEL WITH GFR
Anion gap: 13 (ref 5–15)
BUN: 31 mg/dL — ABNORMAL HIGH (ref 8–23)
CO2: 23 mmol/L (ref 22–32)
Calcium: 9 mg/dL (ref 8.9–10.3)
Chloride: 94 mmol/L — ABNORMAL LOW (ref 98–111)
Creatinine, Ser: 3.18 mg/dL — ABNORMAL HIGH (ref 0.44–1.00)
GFR, Estimated: 14 mL/min — ABNORMAL LOW (ref >60)
Glucose, Bld: 91 mg/dL (ref 70–99)
Potassium: 3.4 mmol/L — ABNORMAL LOW (ref 3.5–5.1)
Sodium: 130 mmol/L — ABNORMAL LOW (ref 135–145)

## 2023-10-16 LAB — PROTIME-INR
INR: 1.2 (ref 0.8–1.2)
Prothrombin Time: 15.7 s — ABNORMAL HIGH (ref 11.4–15.2)

## 2023-10-16 SURGERY — BRONCHOSCOPY, WITH EBUS
Anesthesia: General | Laterality: Right

## 2023-10-16 MED ORDER — FUROSEMIDE 10 MG/ML IJ SOLN
20.0000 mg | Freq: Once | INTRAMUSCULAR | Status: AC
  Administered 2023-10-16: 20 mg via INTRAVENOUS

## 2023-10-16 MED ORDER — WARFARIN SODIUM 4 MG PO TABS
4.0000 mg | ORAL_TABLET | Freq: Once | ORAL | Status: AC
  Administered 2023-10-16: 4 mg via ORAL
  Filled 2023-10-16: qty 1

## 2023-10-16 MED ORDER — ROCURONIUM BROMIDE 10 MG/ML (PF) SYRINGE
PREFILLED_SYRINGE | INTRAVENOUS | Status: DC | PRN
  Administered 2023-10-16: 30 mg via INTRAVENOUS

## 2023-10-16 MED ORDER — ALBUTEROL SULFATE (2.5 MG/3ML) 0.083% IN NEBU
2.5000 mg | INHALATION_SOLUTION | Freq: Four times a day (QID) | RESPIRATORY_TRACT | Status: DC
  Administered 2023-10-16 – 2023-10-17 (×4): 2.5 mg via RESPIRATORY_TRACT
  Filled 2023-10-16 (×4): qty 3

## 2023-10-16 MED ORDER — PHENYLEPHRINE HCL-NACL 20-0.9 MG/250ML-% IV SOLN
INTRAVENOUS | Status: DC | PRN
  Administered 2023-10-16: 50 ug/min via INTRAVENOUS

## 2023-10-16 MED ORDER — PIPERACILLIN-TAZOBACTAM IN DEX 2-0.25 GM/50ML IV SOLN
2.2500 g | Freq: Three times a day (TID) | INTRAVENOUS | Status: DC
  Administered 2023-10-16 – 2023-10-21 (×16): 2.25 g via INTRAVENOUS
  Filled 2023-10-16 (×18): qty 50

## 2023-10-16 MED ORDER — SODIUM CHLORIDE 0.9 % IV SOLN: INTRAVENOUS | Status: DC | PRN

## 2023-10-16 MED ORDER — PROPOFOL 500 MG/50ML IV EMUL
INTRAVENOUS | Status: DC | PRN
  Administered 2023-10-16: 50 ug/kg/min via INTRAVENOUS

## 2023-10-16 MED ORDER — HEPARIN (PORCINE) 25000 UT/250ML-% IV SOLN
900.0000 [IU]/h | INTRAVENOUS | Status: DC
  Administered 2023-10-16 – 2023-10-17 (×2): 750 [IU]/h via INTRAVENOUS
  Administered 2023-10-18 – 2023-10-20 (×2): 700 [IU]/h via INTRAVENOUS
  Administered 2023-10-21: 800 [IU]/h via INTRAVENOUS
  Administered 2023-10-21: 750 [IU]/h via INTRAVENOUS
  Filled 2023-10-16 (×4): qty 250

## 2023-10-16 MED ORDER — FUROSEMIDE 10 MG/ML IJ SOLN
INTRAMUSCULAR | Status: AC
  Filled 2023-10-16: qty 4

## 2023-10-16 MED ORDER — PROPOFOL 10 MG/ML IV BOLUS
INTRAVENOUS | Status: DC | PRN
  Administered 2023-10-16: 20 mg via INTRAVENOUS
  Administered 2023-10-16: 50 mg via INTRAVENOUS

## 2023-10-16 MED ORDER — DEXAMETHASONE SODIUM PHOSPHATE 10 MG/ML IJ SOLN
INTRAMUSCULAR | Status: DC | PRN
  Administered 2023-10-16: 10 mg via INTRAVENOUS

## 2023-10-16 MED ORDER — CHLORHEXIDINE GLUCONATE 0.12 % MT SOLN
OROMUCOSAL | Status: AC
  Filled 2023-10-16: qty 15

## 2023-10-16 MED ORDER — POTASSIUM CHLORIDE 10 MEQ/100ML IV SOLN
10.0000 meq | INTRAVENOUS | Status: AC
  Administered 2023-10-16 (×4): 10 meq via INTRAVENOUS
  Filled 2023-10-16 (×4): qty 100

## 2023-10-16 MED ORDER — CHLORHEXIDINE GLUCONATE 0.12 % MT SOLN
15.0000 mL | Freq: Once | OROMUCOSAL | Status: AC
Start: 2023-10-16 — End: 2023-10-16
  Administered 2023-10-16: 15 mL via OROMUCOSAL

## 2023-10-16 MED ORDER — PHENYLEPHRINE 80 MCG/ML (10ML) SYRINGE FOR IV PUSH (FOR BLOOD PRESSURE SUPPORT)
PREFILLED_SYRINGE | INTRAVENOUS | Status: DC | PRN
  Administered 2023-10-16 (×2): 80 ug via INTRAVENOUS

## 2023-10-16 MED ORDER — ALBUTEROL SULFATE (2.5 MG/3ML) 0.083% IN NEBU
INHALATION_SOLUTION | RESPIRATORY_TRACT | Status: AC
  Filled 2023-10-16: qty 3

## 2023-10-16 MED ORDER — SUGAMMADEX SODIUM 200 MG/2ML IV SOLN
INTRAVENOUS | Status: DC | PRN
  Administered 2023-10-16: 150 mg via INTRAVENOUS

## 2023-10-16 MED ORDER — ONDANSETRON HCL 4 MG/2ML IJ SOLN
INTRAMUSCULAR | Status: DC | PRN
Start: 2023-10-16 — End: 2023-10-16
  Administered 2023-10-16: 4 mg via INTRAVENOUS

## 2023-10-16 MED ORDER — FENTANYL CITRATE (PF) 250 MCG/5ML IJ SOLN
INTRAMUSCULAR | Status: DC | PRN
Start: 2023-10-16 — End: 2023-10-16
  Administered 2023-10-16 (×2): 25 ug via INTRAVENOUS

## 2023-10-16 MED ORDER — FENTANYL CITRATE (PF) 100 MCG/2ML IJ SOLN
INTRAMUSCULAR | Status: AC
  Filled 2023-10-16: qty 2

## 2023-10-16 MED ORDER — IPRATROPIUM-ALBUTEROL 0.5-2.5 (3) MG/3ML IN SOLN
RESPIRATORY_TRACT | Status: AC
  Filled 2023-10-16: qty 3

## 2023-10-16 MED ORDER — LIDOCAINE 2% (20 MG/ML) 5 ML SYRINGE
INTRAMUSCULAR | Status: DC | PRN
  Administered 2023-10-16: 40 mg via INTRAVENOUS

## 2023-10-16 NOTE — Anesthesia Preprocedure Evaluation (Addendum)
 Anesthesia Evaluation  Patient identified by MRN, date of birth, ID band Patient awake    Reviewed: Allergy & Precautions, H&P , NPO status , Patient's Chart, lab work & pertinent test results, reviewed documented beta blocker date and time   History of Anesthesia Complications (+) history of anesthetic complications (issues waking up after CCY 2015)  Airway Mallampati: II  TM Distance: >3 FB Neck ROM: Full    Dental  (+) Edentulous Lower, Edentulous Upper   Pulmonary asthma , COPD,  COPD inhaler, former smoker   Pulmonary exam normal breath sounds clear to auscultation       Cardiovascular hypertension (131/64), Pt. on medications and Pt. on home beta blockers + CAD and +CHF (LVEF 25%)  Normal cardiovascular exam+ dysrhythmias + pacemaker + Cardiac Defibrillator (BiV AICD) + Valvular Problems/Murmurs (s/p MVR 2007, mild AI) MVP and AI  Rhythm:Regular Rate:Normal  Echo 2025  1. Left ventricular ejection fraction, by estimation, is 25%. The left  ventricle has severely decreased function. The left ventricle demonstrates  global hypokinesis. The left ventricular internal cavity size was  moderately dilated. Left ventricular  diastolic parameters are indeterminate.   2. Right ventricular systolic function is mildly reduced. The right  ventricular size is normal. Tricuspid regurgitation signal is inadequate  for assessing PA pressure.   3. The mitral valve has been repaired/replaced. Trivial mitral valve  regurgitation likely normal washing jet. The mean mitral valve gradient is  4.0 mmHg with average heart rate of 86 bpm. There is a 25 mm [name]  present in the mitral position.  Procedure Date: 02/26/2006. Echo findings are consistent with normal  structure and function of the mitral valve prosthesis.   4. The aortic valve is tricuspid. There is mild calcification of the  aortic valve. Aortic valve regurgitation is mild. No  aortic stenosis is  present.   5. The inferior vena cava is dilated in size with <50% respiratory  variability, suggesting right atrial pressure of 15 mmHg.     Neuro/Psych  PSYCHIATRIC DISORDERS Anxiety Depression    negative neurological ROS     GI/Hepatic Neg liver ROS, PUD,GERD  Controlled and Medicated,,  Endo/Other  Hypothyroidism    Renal/GU negative Renal ROS  negative genitourinary   Musculoskeletal  (+) Arthritis , Osteoarthritis,  Fibromyalgia -  Abdominal   Peds negative pediatric ROS (+)  Hematology Hb 9, plt 285   Anesthesia Other Findings   Reproductive/Obstetrics negative OB ROS                             Anesthesia Physical Anesthesia Plan  ASA: 4  Anesthesia Plan: General   Post-op Pain Management:    Induction:   PONV Risk Score and Plan: 2 and Ondansetron  and Treatment may vary due to age or medical condition  Airway Management Planned: Oral ETT  Additional Equipment: None  Intra-op Plan:   Post-operative Plan: Extubation in OR  Informed Consent: I have reviewed the patients History and Physical, chart, labs and discussed the procedure including the risks, benefits and alternatives for the proposed anesthesia with the patient or authorized representative who has indicated his/her understanding and acceptance.     Dental advisory given  Plan Discussed with: CRNA  Anesthesia Plan Comments:         Anesthesia Quick Evaluation

## 2023-10-16 NOTE — Progress Notes (Signed)
 PHARMACY - ANTICOAGULATION CONSULT NOTE  Pharmacy Consult for Warfarin > heparin  bridge Indication: hx mechanical mitral valve  Allergies  Allergen Reactions   Ace Inhibitors Cough   Lipitor [Atorvastatin Calcium ] Other (See Comments)    Knee pain    Megace [Megestrol] Other (See Comments)    High blood pressure and blurred vision   Aspirin  Other (See Comments)    Nephrologist said to not take this Update 10/07/22 - patient clarifies this recommendation was lumped in with a whole bunch of other medications at that time many years ago when nephrologist told her to stay away from Tylenol , vitamins, ASA. Denies prior allergy or allergic reaction to this   Clorazepate Dipotassium Other (See Comments)    Interacts with a drug being taken   Simvastatin Other (See Comments)    Muscle pain   Tiotropium Bromide Monohydrate Other (See Comments)    Dry mouth   Codeine Nausea And Vomiting   Tape Rash    Patient Measurements: Height: 5\' 2"  (157.5 cm) Weight: 42 kg (92 lb 9.5 oz) IBW/kg (Calculated) : 50.1 HEPARIN  DW (KG): 42  Vital Signs: Temp: 97.9 F (36.6 C) (05/16 1250) Temp Source: Oral (05/16 1250) BP: 139/53 (05/16 1250) Pulse Rate: 95 (05/16 1250)  Labs: Recent Labs    10/13/23 1755 10/13/23 2058 10/14/23 0617 10/15/23 0725 10/15/23 1932 10/16/23 0839  HGB  --   --  9.5* 9.2*  --  9.0*  HCT  --   --  29.0* 27.9*  --  26.6*  PLT  --   --  326 309  --  285  LABPROT  --   --  33.0* 16.0*  --  15.7*  INR  --   --  3.2* 1.3*  --  1.2  HEPARINUNFRC  --   --   --   --  0.13*  --   CREATININE  --   --  1.22* 2.01*  --  3.18*  TROPONINIHS 1,614* 1,306*  --   --   --   --     Estimated Creatinine Clearance: 9.2 mL/min (A) (by C-G formula based on SCr of 3.18 mg/dL (H)).  Assessment: 67 yof with a history of CAD, HF, s/p ICD, mitral valve prolapse s/p MVR (2007) and PAF, on warfarin PTA. Patient is presenting with SOB. Warfarin per pharmacy consult placed for mechanical  valve.  INR 4.4 on admit and warfarin held.  CCM/Pulmonary consulted 5/14 for mediastinal mass and bronchoscopy planned. INR 3.2 on 5/14 and Vitamin K 2.5 mg IV given >INR down to 1.3 on 5/15 and Heparin  bridge added. Heparin  held for 2 hours prior to bronchoscopy on 5/16 am, and my resume at 3pm per Dr. Gaynell Keeler. Per Dr. Gaynell Keeler, may also resume Warfarin.  Heparin  level was low last night (0.13) on 600 units/hr and rate increased to 750 units/hr. INR 1.2.  CBC has trended down slightly, no bleeding reported.   PTA warfarin regimen: 2.5 mg daily except 1.25 mg on Fridays and Sundays. Last taken 5/12 at 6pm.  Goal of Therapy:  Heparin  level 0.3-0.7 units/ml INR 2.5-3.5 Monitor platelets by anticoagulation protocol: Yes   Plan:  Resume heparin  drip at 3pm at 750 units/hr. Heparin  level ~8 hrs after drip resumes. Warfarin 4 mg x 1 today.  Daily heparin  level, PT/INR and CBC.  Adolphus Akin, RPh 10/16/2023,2:39 PM

## 2023-10-16 NOTE — Plan of Care (Signed)

## 2023-10-16 NOTE — Anesthesia Postprocedure Evaluation (Signed)
 Anesthesia Post Note  Patient: [name]  Procedure(s) Performed: BRONCHOSCOPY, WITH EBUS (Right) IRRIGATION, BRONCHUS FINE NEEDLE ASPIRATION     Patient location during evaluation: PACU Anesthesia Type: General Level of consciousness: awake and alert, oriented and patient cooperative Pain management: pain level controlled Vital Signs Assessment: post-procedure vital signs reviewed and stable Respiratory status: spontaneous breathing, nonlabored ventilation and respiratory function stable Cardiovascular status: blood pressure returned to baseline and stable Postop Assessment: no apparent nausea or vomiting Anesthetic complications: no   No notable events documented.  Last Vitals:  Vitals:   10/16/23 0817 10/16/23 0930  BP: (!) 126/56 131/64  Pulse: 80 80  Resp: 18 18  Temp: 36.6 C 36.5 C  SpO2: 97% 95%    Last Pain:  Vitals:   10/16/23 0930  TempSrc: Temporal  PainSc: 0-No pain                 Jacquelyne Matte

## 2023-10-16 NOTE — Progress Notes (Signed)
 NAME:  [name], MRN:  [id number], DOB:  1941-05-06, LOS: 3 ADMISSION DATE:  10/13/2023, CONSULTATION DATE: 10/14/2023 REFERRING MD: [facility], CHIEF COMPLAINT: Hemoptysis shortness of breath  History of Present Illness:  83 year old female quit smoking 20 years ago with extensive past medical history in the areas of cardiology and orthopedics with 2 shoulder replacements and a pacemaker implantation.  CT scan reveals a large esophageal mass and right lower lobe opacification.  She has shortness of breath with any activity and with eating.  She has intermittent periods of hemoptysis with daily sputum production that is either clear or bloody.  Pulmonary critical care asked to evaluate.  Pertinent  Medical History   Past Medical History:  Diagnosis Date   Allergy    seasonal   Anemia    Anxiety    Arthritis    Asthma    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 Protecta   Biventricular ICD (implantable cardioverter-defibrillator) in place    with CRT   Blood transfusion without reported diagnosis    Bursitis    Cataract    RIGHT EYE   CHF (congestive heart failure) (HCC)    Chronic kidney disease    Colon polyp    adenomatous   Complication of anesthesia    patient stated that had difficulty getting the breathing tube removed, patient said that she stopped breathing and HR dropped to 10  patient then woke up and started breathing pateint stated no longer than one minute; re-intubated in PACU following cholecystectomy 10/28/13   COPD (chronic obstructive pulmonary disease) (HCC)    Depression    Diverticulosis    Dysrhythmia    Fibromyalgia    GERD (gastroesophageal reflux disease)    Gout    H/O mitral valve replacement 2002, 2007   Heart murmur    Hemorrhoids    Hyperlipidemia    Hypertension    Hypothyroidism    IBS (irritable bowel syndrome)    MVP (mitral valve prolapse)    NAUSEA AND VOMITING 08/01/2009   Neuromuscular disorder (HCC)     fibromyalgia   Pacemaker    Peptic ulcer disease      Significant Hospital Events: Including procedures, antibiotic start and stop dates in addition to other pertinent events   CT chest reviewed by myself  Interim History / Subjective:  No overnight events Does have some abdominal discomfort, headache  Objective    Blood pressure 136/65, pulse 77, temperature 97.9 F (36.6 C), temperature source Oral, resp. rate 18, height 5\' 2"  (1.575 m), weight 42 kg, SpO2 98%.        Intake/Output Summary (Last 24 hours) at 10/16/2023 6440 Last data filed at 10/16/2023 3474 Gross per 24 hour  Intake 326.18 ml  Output --  Net 326.18 ml   Filed Weights   10/14/23 0451  Weight: 42 kg    Examination: Frail, chronically ill-appearing Moist oral mucosa Decreased air movement right base S1-S2 appreciated Bowel sounds appreciated Extremities warm and dry   Resolved problem list   Assessment and Plan   Large mediastinal mass with endobronchial extension Dysphagia Recent history of hemoptysis Mechanical mitral valve, was on Coumadin , bridged with heparin , heparin  on hold for procedure today  I did discuss procedure with her again today, I have had conversations with herself and her spouse -All questions answered -We are in agreement to proceed with bronchoscopy this morning at 930 Heparin  held this morning  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  Best Practice (right click  and "Reselect all SmartList Selections" daily)   Diet/type: Regular consistency (see orders) DVT prophylaxis prophylactic heparin  coumadin  on hold Pressure ulcer(s): N/A GI prophylaxis: PPI Lines: N/A Foley:  N/A Code Status:  full code Last date of multidisciplinary goals of care discussion [tbd]  Labs   CBC: Recent Labs  Lab 10/13/23 1513 10/14/23 0617 10/15/23 0725  WBC 16.8* 15.3* 14.2*  HGB 10.5* 9.5* 9.2*  HCT 33.2* 29.0* 27.9*  MCV 96.8 92.4 91.8  PLT 340 326 309    Basic Metabolic  Panel: Recent Labs  Lab 10/13/23 1513 10/14/23 0617 10/15/23 0725  NA 134* 134* 132*  K 3.7 4.1 3.9  CL 98 100 96*  CO2 26 22 21*  GLUCOSE 136* 97 80  BUN 16 16 24*  CREATININE 1.00 1.22* 2.01*  CALCIUM  10.2 9.9 9.3   GFR: Estimated Creatinine Clearance: 14.6 mL/min (A) (by C-G formula based on SCr of 2.01 mg/dL (H)). Recent Labs  Lab 10/13/23 1513 10/13/23 1755 10/14/23 0617 10/15/23 0725  WBC 16.8*  --  15.3* 14.2*  LATICACIDVEN  --  1.0  --   --     Liver Function Tests: Recent Labs  Lab 10/14/23 0617  AST 30  ALT 20  ALKPHOS 74  BILITOT 0.7  PROT 6.0*  ALBUMIN 2.4*   No results for input(s): "LIPASE", "AMYLASE" in the last 168 hours. No results for input(s): "AMMONIA" in the last 168 hours.  ABG    Component Value Date/Time   PHART 7.416 10/08/2022 1128   PCO2ART 42.5 10/08/2022 1128   PO2ART 70 (L) 10/08/2022 1128   HCO3 27.3 10/08/2022 1128   TCO2 29 10/08/2022 1128   ACIDBASEDEF 2.0 06/20/2022 1619   O2SAT 94 10/08/2022 1128     Coagulation Profile: Recent Labs  Lab 10/13/23 1447 10/14/23 0617 10/15/23 0725  INR 4.4* 3.2* 1.3*    Cardiac Enzymes: No results for input(s): "CKTOTAL", "CKMB", "CKMBINDEX", "TROPONINI" in the last 168 hours.  HbA1C: No results found for: "HGBA1C"  CBG: No results for input(s): "GLUCAP" in the last 168 hours.  Myer Artis, MD [facility] Pager: See [place] Foley

## 2023-10-16 NOTE — Care Management Important Message (Signed)
 Important Message  Patient Details  Name: [name] MRN: [id number] Date of Birth: August 24, 1940   Important Message Given:  Yes - Medicare IM     Wynonia Hedges 10/16/2023, 2:05 PM

## 2023-10-16 NOTE — Progress Notes (Signed)
 Tolerated bronchoscopy well  May reinitiate heparin  after 1500 hrs.

## 2023-10-16 NOTE — Progress Notes (Addendum)
 Progress Note   Patient: [name] ION:[id number] DOB: 1941-04-29 DOA: 10/13/2023     3 DOS: the patient was seen and examined on 10/16/2023   Brief hospital course: The patient is a 83 yr old woman who presented to the ED on 10/13/2023 with complaints of severe coughing and occasional hemoptysis going on for several months. The patient states that she has been followed by her PCP through this and that she has gone through several rounds of antibiotics for it. In addition to the coughing she has been having chest pain with swallowing, globus sensation in the throat and shortness of breath since January.   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 Her INR was found to be 3.2 this morning. Her coumadin  has been held since 10/13/2023, and she has been given 2.5 mg of vitamin K today. Goal is INR of <1.5.   She has been found to have a large mass in her chest. It is described as a subcarinal soft tissue mass 3.2 x 5.6 with mass effect on the esophagus which is deviated to the left. There is also complete consolidation with volume loss in the right lower lobe with opacification of the bronchus intermedius and the right lower lobe bronchus. This is possibly due to a central obstructing neoplasm.  GI and PCCM consults have been placed. Plan is for bronchoscopy on 10/16/2023. He is on a clear liquid diet. GI has been consulted.  Cardiology has also been consulted due to an elevated troponin of 1600 on admission. This has since downtrended. Cardiology feels that this is due to demand ischemia type II MI. 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 Her chest pain is related to eating. Echocardiogram has demonstrated EF of 25%, global hypokinesis with a moderately dilated LV. Mildly reduced RV function. Prosthetic MV with normal structure and function. It is relatively  unchanged from previous. Cardiology is comfortable that her elevated troponin is not ischemic in nature.  The patient underwent bronchoscopy today. She returned to the room with audible wheezing and complaints of tightness in her chest. She had wheezes throughout on exam. She was started on albuterol  nebulizers q 6 hours scheduled.  Assessment and Plan:  Large intrathoracic soft tissue mass Suspected obstructing neoplasm in right lower lobe ?  Postobstructive pneumonia - Empirically treated with broad-spectrum antibiotics.  Leukocytosis otherwise not meeting SIRS criteria.  Saturating appropriately on room air - Further management pending Pulmonology consult. Recommendations appreciated.  - zosyn   - Sputum and blood culutre and MRSA Nares pending  -- The patient underwent bronchoscopy with EUS today. Biopsies of a circumferential mass were taken.  Hypoxia --The patient returned after bronchoscopy hypoxic and severe wheezing. She was requiring 4L in order to maintain saturations in the nineties. She was started on scheduled albuterol  nebs. Monitor for improvement.   Elevated troponin - Due to demand demand ischemia mismatch in the setting of above, EKG non-ischemic.  - Trend troponins, EKG,  monitor on telemetry and obtain echo - Formal cardiology consult pending -- Holding anticoagulation in setting of supratherapeutic INR and possible bronch/biopsy  AKI --Creatinine has increased from 1.2 to 2.0. Have reduced lasix  to 20 mg daily and stopped irbesartan  and spironolactone .  PAF --The patient has been continued on Metoprolol  succinate 75 mg bid.  --Coumadin  has been held due to a supertherapeutic INR of 1.3 this morning.  She received 2.5 mg of vitamin K today in preparation for bronchoscopy/thoracentesis/EGD in coming days.  -- Heparin  will be restarted following the procedure. Coumadin  will also be restarted and the patient returned to a therapeutic range.  Dysphagia -- Patient is  tolerating a clear liquid diet well. Will hold off on trying to advance her diet until after bronchoscopy.   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 -Most recent echo with severely reduced ejection fraction.  Repeat echo pending as above - Resume GDMT -Pharmacy to dose warfarin, assistance appreciated    This patient was seen and examined by me. I have spent 35 minutes in her evaluation and care.     Subjective: The patient is cachectic weak and frail. She is awake, alert, and oriented x 3. Her spouse is at bedside.   Physical Exam: Vitals:   10/16/23 1250 10/16/23 1308 10/16/23 1517 10/16/23 1524  BP: (!) 139/53     Pulse: 95     Resp:      Temp: 97.9 F (36.6 C)     TempSrc: Oral     SpO2: 94% 95% 96% 96%  Weight:      Height:       Exam:  Constitutional:  The patient is awake, alert, and oriented x 3. No acute distress. She is complaining of feeling weak. Respiratory:  Positive for tachypnea, accessory muscle use Positive for wheezes throughout. No rales or rhonchi. No tactile fremitus Cardiovascular:  Regular rate and rhythm No murmurs, ectopy, or gallups. No lateral PMI. No thrills. Abdomen:  Abdomen is soft, non-tender, non-distended No hernias, masses, or organomegaly Normoactive bowel sounds.  Musculoskeletal:  No cyanosis, clubbing, or edema Cachectic Skin:  No rashes, lesions, ulcers palpation of skin: no induration or nodules Neurologic:  CN 2-12 intact Sensation all 4 extremities intact Psychiatric:  Mental status Mood, affect appropriate Orientation to person, place, time  judgment and insight appear intact  Data Reviewed:  CTA chest  Family Communication:None available today.  Disposition: Status is: Inpatient Remains inpatient appropriate because: Pt with large intrathoracic mass with  hemoptysis and dysphagia. She requires a thorough and urgent work up. Furthermore the patient has non-obstructive CAD and severe combined CHF.   Planned Discharge Destination: tbd    Time spent: 35 minutes  Author: Ayan Yankey, DO 10/16/2023 4:10 PM  For on call review [email or web address].

## 2023-10-16 NOTE — Progress Notes (Signed)
 Progress Note  Patient Name: [name] Date of Encounter: 10/16/2023  Primary Cardiologist: Lauro Portal, MD   Subjective   Patient seen examined her bedside - she was in the endo suit waiting for her procedure  Inpatient Medications    Scheduled Meds:  budesonide-glycopyrrolate -formoterol  2 puff Inhalation BID   buPROPion   150 mg Oral q AM   furosemide   20 mg Oral Daily   icosapent  Ethyl  1 g Oral BID   levothyroxine   75 mcg Oral Q0600   metoprolol  succinate  25 mg Oral BH-q7a   And   metoprolol  succinate  50 mg Oral QPM   pantoprazole  (PROTONIX ) IV  40 mg Intravenous QHS   pravastatin   40 mg Oral QHS   senna  30 mg Oral QHS   Warfarin - Pharmacist Dosing Inpatient   Does not apply q1600   Continuous Infusions:  piperacillin-tazobactam (ZOSYN)  IV 3.375 g (10/16/23 0348)   PRN Meds: acetaminophen , albuterol , ALPRAZolam , antiseptic oral rinse, metoCLOPramide , traMADol , traZODone    Vital Signs    Vitals:   10/15/23 2124 10/16/23 0013 10/16/23 0448 10/16/23 0817  BP:  121/63 136/65 (!) 126/56  Pulse:  79 77 80  Resp:  18 18 18   Temp:  97.7 F (36.5 C) 97.9 F (36.6 C) 97.9 F (36.6 C)  TempSrc:  Oral Oral Oral  SpO2: 94% 98% 98% 97%  Weight:      Height:        Intake/Output Summary (Last 24 hours) at 10/16/2023 0848 Last data filed at 10/16/2023 0318 Gross per 24 hour  Intake 224.53 ml  Output --  Net 224.53 ml   Filed Weights   10/14/23 0451  Weight: 42 kg    Telemetry     - Personally Reviewed  ECG     - Personally Reviewed  Physical Exam     General: Comfortable, Head: Atraumatic, normal size  Eyes: PEERLA, EOMI  Neck: Supple, normal JVD Cardiac: Normal S1, S2; RRR; no murmurs, rubs, or gallops Lungs: Clear to auscultation bilaterally Abd: Soft, nontender, no hepatomegaly  Ext: warm, no edema Musculoskeletal: No deformities, BUE and BLE strength normal and equal Skin: Warm and dry, no rashes   Neuro: Alert and oriented  to person, place, time, and situation, CNII-XII grossly intact, no focal deficits  Psych: Normal mood and affect   Labs    Chemistry Recent Labs  Lab 10/13/23 1513 10/14/23 0617 10/15/23 0725  NA 134* 134* 132*  K 3.7 4.1 3.9  CL 98 100 96*  CO2 26 22 21*  GLUCOSE 136* 97 80  BUN 16 16 24*  CREATININE 1.00 1.22* 2.01*  CALCIUM  10.2 9.9 9.3  PROT  --  6.0*  --   ALBUMIN  --  2.4*  --   AST  --  30  --   ALT  --  20  --   ALKPHOS  --  74  --   BILITOT  --  0.7  --   GFRNONAA 57* 45* 24*  ANIONGAP 10 12 15      Hematology Recent Labs  Lab 10/13/23 1513 10/14/23 0617 10/15/23 0725  WBC 16.8* 15.3* 14.2*  RBC 3.43* 3.14* 3.04*  HGB 10.5* 9.5* 9.2*  HCT 33.2* 29.0* 27.9*  MCV 96.8 92.4 91.8  MCH 30.6 30.3 30.3  MCHC 31.6 32.8 33.0  RDW 14.4 14.6 14.6  PLT 340 326 309    Cardiac EnzymesNo results for input(s): "TROPONINI" in the last 168 hours. No results  for input(s): "TROPIPOC" in the last 168 hours.   BNP Recent Labs  Lab 10/13/23 1755  BNP 580.4*     DDimer No results for input(s): "DDIMER" in the last 168 hours.   Radiology    ECHOCARDIOGRAM COMPLETE Result Date: 10/14/2023    ECHOCARDIOGRAM REPORT   Patient Name:   [name] [facility] Date of Exam: 10/14/2023 Medical Rec #:  [id number]           Height:       62.0 in Accession #:    [id number]          Weight:       92.6 lb Date of Birth:  June 21, 1940            BSA:          1.378 m Patient Age:    83 years            BP:           135/66 mmHg Patient Gender: F                   HR:           90 bpm. Exam Location:  Inpatient Procedure: 2D Echo (Both Spectral and Color Flow Doppler were utilized during            procedure). Indications:    Chest Pain R07.9  History:        Patient has prior history of Echocardiogram examinations, most                 recent 02/20/2023. CHF, COPD, Mitral Valve Disease; Risk                 Factors:Hypertension.                  Mitral Valve: 25 mm St. [name] valve is present in the  mitral                 position. Procedure Date: 02/26/2006.  Sonographer:    Astrid Blamer Referring Phys: [id number] MARISSA C KRUGH IMPRESSIONS  1. Left ventricular ejection fraction, by estimation, is 25%. The left ventricle has severely decreased function. The left ventricle demonstrates global hypokinesis. The left ventricular internal cavity size was moderately dilated. Left ventricular diastolic parameters are indeterminate.  2. Right ventricular systolic function is mildly reduced. The right ventricular size is normal. Tricuspid regurgitation signal is inadequate for assessing PA pressure.  3. The mitral valve has been repaired/replaced. Trivial mitral valve regurgitation likely normal washing jet. The mean mitral valve gradient is 4.0 mmHg with average heart rate of 86 bpm. There is a 25 mm St. [name] present in the mitral position. Procedure Date: 02/26/2006. Echo findings are consistent with normal structure and function of the mitral valve prosthesis.  4. The aortic valve is tricuspid. There is mild calcification of the aortic valve. Aortic valve regurgitation is mild. No aortic stenosis is present.  5. The inferior vena cava is dilated in size with <50% respiratory variability, suggesting right atrial pressure of 15 mmHg. FINDINGS  Left Ventricle: Left ventricular ejection fraction, by estimation, is 25%. The left ventricle has severely decreased function. The left ventricle demonstrates global hypokinesis. The left ventricular internal cavity size was moderately dilated. There is  no left ventricular hypertrophy. Left ventricular diastolic parameters are indeterminate. Right Ventricle: The right ventricular size is normal. No increase in right ventricular wall thickness. Right ventricular systolic function is mildly reduced. Tricuspid  regurgitation signal is inadequate for assessing PA pressure. Left Atrium: Left atrial size was normal in size. Right Atrium: Right atrial size was normal in size. Pericardium:  There is no evidence of pericardial effusion. Mitral Valve: The mitral valve has been repaired/replaced. Trivial mitral valve regurgitation. There is a 25 mm St. [name] present in the mitral position. Procedure Date: 02/26/2006. Echo findings are consistent with normal structure and function of the mitral valve prosthesis. MV peak gradient, 6.8 mmHg. The mean mitral valve gradient is 4.0 mmHg with average heart rate of 86 bpm. Tricuspid Valve: The tricuspid valve is normal in structure. Tricuspid valve regurgitation is trivial. No evidence of tricuspid stenosis. Aortic Valve: The aortic valve is tricuspid. There is mild calcification of the aortic valve. Aortic valve regurgitation is mild. Aortic regurgitation PHT measures 562 msec. No aortic stenosis is present. Aortic valve mean gradient measures 4.0 mmHg. Aortic valve peak gradient measures 6.5 mmHg. Aortic valve area, by VTI measures 2.17 cm. Pulmonic Valve: The pulmonic valve was grossly normal. Pulmonic valve regurgitation is trivial. No evidence of pulmonic stenosis. Aorta: The aortic root is normal in size and structure. Venous: The inferior vena cava is dilated in size with less than 50% respiratory variability, suggesting right atrial pressure of 15 mmHg. IAS/Shunts: No atrial level shunt detected by color flow Doppler.  LEFT VENTRICLE PLAX 2D LVIDd:         4.80 cm LVIDs:         4.00 cm LV PW:         0.90 cm LV IVS:        0.90 cm LVOT diam:     1.90 cm LV SV:         51 LV SV Index:   37 LVOT Area:     2.84 cm  RIGHT VENTRICLE TAPSE (M-mode): 1.0 cm LEFT ATRIUM             Index        RIGHT ATRIUM          Index LA Vol (A2C):   34.4 ml 24.96 ml/m  RA Area:     5.91 cm LA Vol (A4C):   38.5 ml 27.94 ml/m  RA Volume:   8.12 ml  5.89 ml/m LA Biplane Vol: 37.0 ml 26.85 ml/m  AORTIC VALVE AV Area (Vmax):    2.37 cm AV Area (Vmean):   1.95 cm AV Area (VTI):     2.17 cm AV Vmax:           127.00 cm/s AV Vmean:          103.000 cm/s AV VTI:             0.234 m AV Peak Grad:      6.5 mmHg AV Mean Grad:      4.0 mmHg LVOT Vmax:         106.00 cm/s LVOT Vmean:        70.700 cm/s LVOT VTI:          0.179 m LVOT/AV VTI ratio: 0.76 AI PHT:            562 msec  AORTA Ao Root diam: 3.10 cm MITRAL VALVE MV Area (PHT): 3.65 cm     SHUNTS MV Area VTI:   1.84 cm     Systemic VTI:  0.18 m MV Peak grad:  6.8 mmHg     Systemic Diam: 1.90 cm MV Mean grad:  4.0 mmHg MV Vmax:  1.31 m/s MV Vmean:      96.5 cm/s MV Decel Time: 208 msec MV E velocity: 126.00 cm/s MV A velocity: 128.00 cm/s MV E/A ratio:  0.98 Grady Lawman MD Electronically signed by Grady Lawman MD Signature Date/Time: 10/14/2023/1:19:06 PM    Final     Cardiac Studies     Patient Profile     83 y.o. 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  Do agree that her elevated troponin is being mismatch.  Will continue to monitor the patient closely and recommend as appropriately. Echo with no significant change from prior Heparin  held this morning for her procedure - resume the heparin  as soon as appropriate post procedure.   Clinically euvolemic    For questions or updates, please contact [facility] Please consult [email or web address] for contact info under Cardiology/STEMI.      Signed, Briellah Baik, DO  10/16/2023, 8:48 AM

## 2023-10-16 NOTE — Progress Notes (Signed)
 Chest x-ray reviewed  No new infiltrative process, no pneumothorax

## 2023-10-16 NOTE — Anesthesia Procedure Notes (Signed)
 Procedure Name: Intubation Date/Time: 10/16/2023 10:14 AM  Performed by: Leandro Proffer, CRNAPre-anesthesia Checklist: Patient identified, Emergency Drugs available, Suction available and Patient being monitored Patient Re-evaluated:Patient Re-evaluated prior to induction Oxygen Delivery Method: Circle system utilized Preoxygenation: Pre-oxygenation with 100% oxygen Induction Type: IV induction Ventilation: Mask ventilation without difficulty Laryngoscope Size: Mac and 3 Grade View: Grade I Tube type: Oral Tube size: 8.5 mm Number of attempts: 1 Airway Equipment and Method: Stylet and Oral airway Placement Confirmation: ETT inserted through vocal cords under direct vision, positive ETCO2 and breath sounds checked- equal and bilateral Secured at: 21 cm Tube secured with: Tape Dental Injury: Teeth and Oropharynx as per pre-operative assessment

## 2023-10-16 NOTE — Interval H&P Note (Signed)
 History and Physical Interval Note:  10/16/2023 9:35 AM  [name]  has presented today for surgery, with the diagnosis of lung mass, mediastinal mass.  The various methods of treatment have been discussed with the patient and family. After consideration of risks, benefits and other options for treatment, the patient has consented to  Procedure(s): BRONCHOSCOPY, WITH EBUS (Right) as a surgical intervention.  The patient's history has been reviewed, patient examined, no change in status, stable for surgery.  I have reviewed the patient's chart and labs.  Questions were answered to the patient's satisfaction.     Uliana Brinker A Castin Donaghue

## 2023-10-16 NOTE — Progress Notes (Signed)
 PHARMACY NOTE:  ANTIMICROBIAL RENAL DOSAGE ADJUSTMENT  Current antimicrobial regimen includes a mismatch between antimicrobial dosage and estimated renal function.  As per policy approved by the Pharmacy & Therapeutics and Medical Executive Committees, the antimicrobial dosage will be adjusted accordingly.  Current antimicrobial dosage:  Zosyn 3.375 gm IV q8hrs (each over 4 hours)  Indication: CAP  Renal Function:  Estimated Creatinine Clearance: 9.2 mL/min (A) (by C-G formula based on SCr of 3.18 mg/dL (H)). []      On intermittent HD, scheduled: []      On CRRT    Antimicrobial dosage has been changed to:  Zosyn 2.25 gm IV q8hrs.  Additional comments: - Creatinine trended up on 5/15 and up again today.   Thank you for allowing pharmacy to be a part of this patient's care.  Adolphus Akin, [facility] 10/16/2023 2:44 PM

## 2023-10-16 NOTE — Op Note (Signed)
 [facility] Cardiopulmonary Patient Name: [name] Pocedure Date: 10/16/2023 MRN: [id number] Attending MD: Margaretann Sharper MD, MD, [id number] Date of Birth: 11-02-1940 CSN: Finalized Age: 83 Admit Type: Inpatient Gender: Female Procedure:             Bronchoscopy Indications:           Mediastinal adenopathy, Lung mass suspicious for cancer Providers:             Hazely Sealey A. Gaynell Keeler MD, MD, Lonzell Robin, RN,                         Joline Ned, Technician Referring MD:           Medicines:              Complications:         No immediate complications Estimated Blood Loss:  40cc                        Estimated blood loss: 35 mL requiring treatment with                         IV fluids. Procedure:             Pre-Anesthesia Assessment:                        - The heart rate, respiratory rate, oxygen                         saturations, blood pressure, adequacy of pulmonary                         ventilation, and response to care were monitored                         throughout the procedure.                        After obtaining informed consent, the bronchoscope was                         passed under direct vision. Throughout the procedure,                         the patient's blood pressure, pulse, and oxygen                         saturations were monitored continuously. the [id number]                         )[id number]) Olympus broncoscope was introduced through                         the mouth, via the endotracheal tube (the patient was                         intubated for the procedure) and advanced to the                         tracheobronchial tree of both lungs. the [id number]                         (  [id number]) EBUS Scope was introduced through the mouth,                         via the endotracheal tube (the patient was intubated                         for the procedure) and advanced to the                         tracheobronchial tree  of both lungs. The procedure was                         accomplished without difficulty. The patient tolerated                         the procedure well. Scope In: Scope Out: Findings:      Endobronchial mass noted on initail review of airway in Bronchus       intermedius      lavage performed in th bronchus intermedius and sent for gram stain and       cultures and cytology      EBUS scope introduced      no significant adenopathy noted      Noted subcarinal mann and mediastinal mass      TBNA subcarina performed X4      TBNA mediastinal mass performed X4      scope was withdrawn      and regular scope introduced      -biopsy of bronchus intermedius mass performed X3      bleeding controlled with cold saline      Right Lung Abnormalities: A partially obstructing mass was found       proximally in the bronchus intermedius. The mass was medium-sized and       circumferential. The lesion was not traversed.      An endobronchial ultrasound endoscope was utilized in order to assist       with guiding the biopsy needle. Impression:            - Mediastinal adenopathy                        - A circumferential mass was found in the bronchus                         intermedius. This lesion is likely malignant.                        - Endobronchial ultrasound was performed. Moderate Sedation:      An independent trained observer was present and continuously monitored       the patient.      An independent trained observer was present and continuously monitored       the patient. Recommendation:        - Await biopsy, cytology and washing results. Procedure Code(s):     --- Professional ---                        [phone], Bronchoscopy, rigid or flexible, including                         fluoroscopic guidance, when performed;  diagnostic,                         with cell washing, when performed (separate procedure)                        [id number], Bronchoscopy, rigid or flexible, including                          fluoroscopic guidance, when performed; with                         transendoscopic endobronchial ultrasound (EBUS) during                         bronchoscopic diagnostic or therapeutic                         intervention(s) for peripheral lesion(s) (List                         separately in addition to code for primary                         procedure[s]) Diagnosis Code(s):     --- Professional ---                        R91.8, Other nonspecific abnormal finding of lung field                        J98.9, Respiratory disorder, unspecified                        R59.0, Localized enlarged lymph nodes CPT copyright 2022 [employer]. All rights reserved. The codes documented in this report are preliminary and upon coder review may  be revised to meet current compliance requirements. Myer Artis, MD Margaretann Sharper MD, MD 10/16/2023 11:37:21 AM This report has been signed electronically. Number of Addenda: 0

## 2023-10-16 NOTE — H&P (View-Only) (Signed)
 NAME:  [name], MRN:  [id number], DOB:  1941-05-06, LOS: 3 ADMISSION DATE:  10/13/2023, CONSULTATION DATE: 10/14/2023 REFERRING MD: [facility], CHIEF COMPLAINT: Hemoptysis shortness of breath  History of Present Illness:  83 year old female quit smoking 20 years ago with extensive past medical history in the areas of cardiology and orthopedics with 2 shoulder replacements and a pacemaker implantation.  CT scan reveals a large esophageal mass and right lower lobe opacification.  She has shortness of breath with any activity and with eating.  She has intermittent periods of hemoptysis with daily sputum production that is either clear or bloody.  Pulmonary critical care asked to evaluate.  Pertinent  Medical History   Past Medical History:  Diagnosis Date   Allergy    seasonal   Anemia    Anxiety    Arthritis    Asthma    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 Protecta   Biventricular ICD (implantable cardioverter-defibrillator) in place    with CRT   Blood transfusion without reported diagnosis    Bursitis    Cataract    RIGHT EYE   CHF (congestive heart failure) (HCC)    Chronic kidney disease    Colon polyp    adenomatous   Complication of anesthesia    patient stated that had difficulty getting the breathing tube removed, patient said that she stopped breathing and HR dropped to 10  patient then woke up and started breathing pateint stated no longer than one minute; re-intubated in PACU following cholecystectomy 10/28/13   COPD (chronic obstructive pulmonary disease) (HCC)    Depression    Diverticulosis    Dysrhythmia    Fibromyalgia    GERD (gastroesophageal reflux disease)    Gout    H/O mitral valve replacement 2002, 2007   Heart murmur    Hemorrhoids    Hyperlipidemia    Hypertension    Hypothyroidism    IBS (irritable bowel syndrome)    MVP (mitral valve prolapse)    NAUSEA AND VOMITING 08/01/2009   Neuromuscular disorder (HCC)     fibromyalgia   Pacemaker    Peptic ulcer disease      Significant Hospital Events: Including procedures, antibiotic start and stop dates in addition to other pertinent events   CT chest reviewed by myself  Interim History / Subjective:  No overnight events Does have some abdominal discomfort, headache  Objective    Blood pressure 136/65, pulse 77, temperature 97.9 F (36.6 C), temperature source Oral, resp. rate 18, height 5\' 2"  (1.575 m), weight 42 kg, SpO2 98%.        Intake/Output Summary (Last 24 hours) at 10/16/2023 6440 Last data filed at 10/16/2023 3474 Gross per 24 hour  Intake 326.18 ml  Output --  Net 326.18 ml   Filed Weights   10/14/23 0451  Weight: 42 kg    Examination: Frail, chronically ill-appearing Moist oral mucosa Decreased air movement right base S1-S2 appreciated Bowel sounds appreciated Extremities warm and dry   Resolved problem list   Assessment and Plan   Large mediastinal mass with endobronchial extension Dysphagia Recent history of hemoptysis Mechanical mitral valve, was on Coumadin , bridged with heparin , heparin  on hold for procedure today  I did discuss procedure with her again today, I have had conversations with herself and her spouse -All questions answered -We are in agreement to proceed with bronchoscopy this morning at 930 Heparin  held this morning  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  Best Practice (right click  and "Reselect all SmartList Selections" daily)   Diet/type: Regular consistency (see orders) DVT prophylaxis prophylactic heparin  coumadin  on hold Pressure ulcer(s): N/A GI prophylaxis: PPI Lines: N/A Foley:  N/A Code Status:  full code Last date of multidisciplinary goals of care discussion [tbd]  Labs   CBC: Recent Labs  Lab 10/13/23 1513 10/14/23 0617 10/15/23 0725  WBC 16.8* 15.3* 14.2*  HGB 10.5* 9.5* 9.2*  HCT 33.2* 29.0* 27.9*  MCV 96.8 92.4 91.8  PLT 340 326 309    Basic Metabolic  Panel: Recent Labs  Lab 10/13/23 1513 10/14/23 0617 10/15/23 0725  NA 134* 134* 132*  K 3.7 4.1 3.9  CL 98 100 96*  CO2 26 22 21*  GLUCOSE 136* 97 80  BUN 16 16 24*  CREATININE 1.00 1.22* 2.01*  CALCIUM  10.2 9.9 9.3   GFR: Estimated Creatinine Clearance: 14.6 mL/min (A) (by C-G formula based on SCr of 2.01 mg/dL (H)). Recent Labs  Lab 10/13/23 1513 10/13/23 1755 10/14/23 0617 10/15/23 0725  WBC 16.8*  --  15.3* 14.2*  LATICACIDVEN  --  1.0  --   --     Liver Function Tests: Recent Labs  Lab 10/14/23 0617  AST 30  ALT 20  ALKPHOS 74  BILITOT 0.7  PROT 6.0*  ALBUMIN 2.4*   No results for input(s): "LIPASE", "AMYLASE" in the last 168 hours. No results for input(s): "AMMONIA" in the last 168 hours.  ABG    Component Value Date/Time   PHART 7.416 10/08/2022 1128   PCO2ART 42.5 10/08/2022 1128   PO2ART 70 (L) 10/08/2022 1128   HCO3 27.3 10/08/2022 1128   TCO2 29 10/08/2022 1128   ACIDBASEDEF 2.0 06/20/2022 1619   O2SAT 94 10/08/2022 1128     Coagulation Profile: Recent Labs  Lab 10/13/23 1447 10/14/23 0617 10/15/23 0725  INR 4.4* 3.2* 1.3*    Cardiac Enzymes: No results for input(s): "CKTOTAL", "CKMB", "CKMBINDEX", "TROPONINI" in the last 168 hours.  HbA1C: No results found for: "HGBA1C"  CBG: No results for input(s): "GLUCAP" in the last 168 hours.  Myer Artis, MD [facility] Pager: See [place] Foley

## 2023-10-16 NOTE — Transfer of Care (Signed)
 Immediate Anesthesia Transfer of Care Note  Patient: [name]  Procedure(s) Performed: BRONCHOSCOPY, WITH EBUS (Right) IRRIGATION, BRONCHUS FINE NEEDLE ASPIRATION  Patient Location: PACU  Anesthesia Type:General  Level of Consciousness: awake, alert , and drowsy  Airway & Oxygen Therapy: Patient Spontanous Breathing and Patient connected to nasal cannula oxygen  Post-op Assessment: Report given to RN and Post -op Vital signs reviewed and stable  Post vital signs: Reviewed and stable  Last Vitals:  Vitals Value Taken Time  BP 138/50 10/16/23 1139  Temp    Pulse 88 10/16/23 1143  Resp 26 10/16/23 1143  SpO2 88 % 10/16/23 1143  Vitals shown include unfiled device data.  Last Pain:  Vitals:   10/16/23 0930  TempSrc: Temporal  PainSc: 0-No pain         Complications: No notable events documented.

## 2023-10-17 DIAGNOSIS — R7989 Other specified abnormal findings of blood chemistry: Secondary | ICD-10-CM

## 2023-10-17 DIAGNOSIS — Z952 Presence of prosthetic heart valve: Secondary | ICD-10-CM

## 2023-10-17 DIAGNOSIS — M7989 Other specified soft tissue disorders: Secondary | ICD-10-CM | POA: Diagnosis not present

## 2023-10-17 DIAGNOSIS — Z95810 Presence of automatic (implantable) cardiac defibrillator: Secondary | ICD-10-CM

## 2023-10-17 DIAGNOSIS — I480 Paroxysmal atrial fibrillation: Secondary | ICD-10-CM | POA: Diagnosis not present

## 2023-10-17 DIAGNOSIS — R791 Abnormal coagulation profile: Secondary | ICD-10-CM

## 2023-10-17 DIAGNOSIS — D631 Anemia in chronic kidney disease: Secondary | ICD-10-CM

## 2023-10-17 DIAGNOSIS — N1832 Chronic kidney disease, stage 3b: Secondary | ICD-10-CM

## 2023-10-17 LAB — CBC
HCT: 23.5 % — ABNORMAL LOW (ref 36.0–46.0)
Hemoglobin: 7.9 g/dL — ABNORMAL LOW (ref 12.0–15.0)
MCH: 30.4 pg (ref 26.0–34.0)
MCHC: 33.6 g/dL (ref 30.0–36.0)
MCV: 90.4 fL (ref 80.0–100.0)
Platelets: 249 10*3/uL (ref 150–400)
RBC: 2.6 MIL/uL — ABNORMAL LOW (ref 3.87–5.11)
RDW: 14.2 % (ref 11.5–15.5)
WBC: 7.1 10*3/uL (ref 4.0–10.5)
nRBC: 0 % (ref 0.0–0.2)

## 2023-10-17 LAB — BASIC METABOLIC PANEL WITH GFR
Anion gap: 15 (ref 5–15)
BUN: 38 mg/dL — ABNORMAL HIGH (ref 8–23)
CO2: 19 mmol/L — ABNORMAL LOW (ref 22–32)
Calcium: 8.5 mg/dL — ABNORMAL LOW (ref 8.9–10.3)
Chloride: 94 mmol/L — ABNORMAL LOW (ref 98–111)
Creatinine, Ser: 3.75 mg/dL — ABNORMAL HIGH (ref 0.44–1.00)
GFR, Estimated: 12 mL/min — ABNORMAL LOW (ref >60)
Glucose, Bld: 91 mg/dL (ref 70–99)
Potassium: 4.5 mmol/L (ref 3.5–5.1)
Sodium: 128 mmol/L — ABNORMAL LOW (ref 135–145)

## 2023-10-17 LAB — PROTIME-INR
INR: 1.4 — ABNORMAL HIGH (ref 0.8–1.2)
Prothrombin Time: 17 s — ABNORMAL HIGH (ref 11.4–15.2)

## 2023-10-17 LAB — HEPARIN LEVEL (UNFRACTIONATED)
Heparin Unfractionated: 0.31 [IU]/mL (ref 0.30–0.70)
Heparin Unfractionated: 0.35 [IU]/mL (ref 0.30–0.70)

## 2023-10-17 MED ORDER — WARFARIN SODIUM 2.5 MG PO TABS
2.5000 mg | ORAL_TABLET | Freq: Once | ORAL | Status: AC
  Administered 2023-10-17: 2.5 mg via ORAL
  Filled 2023-10-17: qty 1

## 2023-10-17 NOTE — Plan of Care (Signed)

## 2023-10-17 NOTE — Progress Notes (Signed)
   Patient Name: [name] Date of Encounter: 10/17/2023 [facility] Cardiologist: Lauro Portal, MD   Interval Summary  .    Bronchoscopy yesterday, husband at bedside  Vital Signs .    Vitals:   10/17/23 0528 10/17/23 0602 10/17/23 0823 10/17/23 0825  BP: 124/64 124/64 (!) 120/54   Pulse: 84 84 79   Resp: 16  18   Temp: 98.1 F (36.7 C)  98.1 F (36.7 C)   TempSrc:   Oral   SpO2: 98%  98% 98%  Weight:      Height:        Intake/Output Summary (Last 24 hours) at 10/17/2023 1024 Last data filed at 10/17/2023 0438 Gross per 24 hour  Intake 944.58 ml  Output 10 ml  Net 934.58 ml      10/14/2023    4:51 AM 06/10/2023    2:07 PM 05/25/2023    1:27 PM  Last 3 Weights  Weight (lbs) 92 lb 9.5 oz 93 lb 9.6 oz 92 lb  Weight (kg) 42 kg 42.457 kg 41.731 kg      Telemetry/ECG    Sinus rhythm Paced with brief tachy (possible NSVT vs Atrial tach)- Personally Reviewed  Physical Exam .   GEN: No acute distress.   Neck: No JVD Cardiac: Sharp S1 click RRR, no murmurs, rubs, or gallops.  Respiratory: Clear to auscultation bilaterally. GI: Soft, nontender, non-distended  MS: No edema  Assessment & Plan .     82 year old with large intrathoracic soft tissue mass  Paroxysmal atrial fibrillation Mechanical mitral valve ICD Chronic systolic heart failure EF 25%  -Hemoglobin 7.9 down from 9.0 -Creatinine up from 2.01 on the 15th to 3.75 -Continuing IV heparin  and warfarin restarted -With creatinine rising, would hold Lasix  for today.  Only taking 20 mg p.o. - Status post bronchoscopy yesterday on 10/16/2023 -Continue metoprolol   For questions or updates, please contact [facility] Please consult [email or web address] for contact info under        Signed, Dorothye Gathers, MD

## 2023-10-17 NOTE — Progress Notes (Signed)
 PHARMACY - ANTICOAGULATION CONSULT NOTE  Pharmacy Consult for Warfarin > heparin  bridge Indication: hx mechanical mitral valve  Allergies  Allergen Reactions   Ace Inhibitors Cough   Lipitor [Atorvastatin Calcium ] Other (See Comments)    Knee pain    Megace [Megestrol] Other (See Comments)    High blood pressure and blurred vision   Aspirin  Other (See Comments)    Nephrologist said to not take this Update 10/07/22 - patient clarifies this recommendation was lumped in with a whole bunch of other medications at that time many years ago when nephrologist told her to stay away from Tylenol , vitamins, ASA. Denies prior allergy or allergic reaction to this   Clorazepate Dipotassium Other (See Comments)    Interacts with a drug being taken   Simvastatin Other (See Comments)    Muscle pain   Tiotropium Bromide Monohydrate Other (See Comments)    Dry mouth   Codeine Nausea And Vomiting   Tape Rash    Patient Measurements: Height: 5\' 2"  (157.5 cm) Weight: 42 kg (92 lb 9.5 oz) IBW/kg (Calculated) : 50.1 HEPARIN  DW (KG): 42  Vital Signs: Temp: 98.1 F (36.7 C) (05/17 0528) Temp Source: Oral (05/17 0016) BP: 124/64 (05/17 0602) Pulse Rate: 84 (05/17 0602)  Labs: Recent Labs    10/15/23 0725 10/15/23 1932 10/16/23 0839 10/16/23 2340 10/17/23 0642  HGB 9.2*  --  9.0*  --  7.9*  HCT 27.9*  --  26.6*  --  23.5*  PLT 309  --  285  --  249  LABPROT 16.0*  --  15.7*  --  17.0*  INR 1.3*  --  1.2  --  1.4*  HEPARINUNFRC  --  0.13*  --  0.35 0.31  CREATININE 2.01*  --  3.18*  --  3.75*    Estimated Creatinine Clearance: 7.8 mL/min (A) (by C-G formula based on SCr of 3.75 mg/dL (H)).  Assessment: 57 yof with a history of CAD, HF, s/p ICD, mitral valve prolapse s/p MVR (2007) and PAF, on warfarin PTA. Patient is presenting with SOB. Warfarin per pharmacy consult placed for mechanical valve.  INR 4.4 on admit and warfarin held.  CCM/Pulmonary consulted 5/14 for mediastinal mass and  bronchoscopy which was completed 5/16.  PTA warfarin regimen: 2.5 mg daily except 1.25 mg on Fridays and Sundays. Last taken 5/12 at 6pm.  Warfarin has been held since admission on 5/13 - of note did receive vitamin K 2.5 mg IV to reduce for procedure. Warfarin resumed on 5/16 at one time higher dose.  Heparin  level this morning came back therapeutic at 0.31, on 750 units/hr. Hgb trending down to 7.9, plt WNL. INR today came back at 1.4. No s/sx of bleeding or infusion issues noted by nursing.   Goal of Therapy:  Heparin  level 0.3-0.7 units/ml INR 2.5-3.5 Monitor platelets by anticoagulation protocol: Yes   Plan:  Continue heparin  infusion at 750 units/hr until INR therapeutic  Order warfarin 2.5 mg tonight  Daily heparin  level, PT/INR and CBC.  Thank you for allowing pharmacy to participate in this patient's care,  Nieves Bars, PharmD, BCCCP Clinical Pharmacist  Phone: [phone] 10/17/2023 8:19 AM  Please check AMION for all [facility] Pharmacy phone numbers After 10:00 PM, call Main Pharmacy [phone]

## 2023-10-17 NOTE — Progress Notes (Signed)
 PHARMACY - ANTICOAGULATION  Pharmacy Consult for heparin   Indication: hx mechanical mitral valve Brief A/P: Heparin  level within goal range Continue Heparin  at current rate   Allergies  Allergen Reactions   Ace Inhibitors Cough   Lipitor [Atorvastatin Calcium ] Other (See Comments)    Knee pain    Megace [Megestrol] Other (See Comments)    High blood pressure and blurred vision   Aspirin  Other (See Comments)    Nephrologist said to not take this Update 10/07/22 - patient clarifies this recommendation was lumped in with a whole bunch of other medications at that time many years ago when nephrologist told her to stay away from Tylenol , vitamins, ASA. Denies prior allergy or allergic reaction to this   Clorazepate Dipotassium Other (See Comments)    Interacts with a drug being taken   Simvastatin Other (See Comments)    Muscle pain   Tiotropium Bromide Monohydrate Other (See Comments)    Dry mouth   Codeine Nausea And Vomiting   Tape Rash    Patient Measurements: Height: 5\' 2"  (157.5 cm) Weight: 42 kg (92 lb 9.5 oz) IBW/kg (Calculated) : 50.1 HEPARIN  DW (KG): 42  Vital Signs: Temp: 98 F (36.7 C) (05/16 1949) Temp Source: Oral (05/16 1949) BP: 106/47 (05/16 1949) Pulse Rate: 87 (05/16 2053)  Labs: Recent Labs    10/14/23 0617 10/15/23 0725 10/15/23 1932 10/16/23 0839 10/16/23 2340  HGB 9.5* 9.2*  --  9.0*  --   HCT 29.0* 27.9*  --  26.6*  --   PLT 326 309  --  285  --   LABPROT 33.0* 16.0*  --  15.7*  --   INR 3.2* 1.3*  --  1.2  --   HEPARINUNFRC  --   --  0.13*  --  0.35  CREATININE 1.22* 2.01*  --  3.18*  --     Estimated Creatinine Clearance: 9.2 mL/min (A) (by C-G formula based on SCr of 3.18 mg/dL (H)).  Assessment: 83 y.o. female with h/o MVR and AFib, INR subtherapeutic, for heparin   Goal of Therapy:  Heparin  level 0.3-0.7 units/mL Monitor platelets by anticoagulation protocol: Yes   Plan:  No change to heparin   [name], [name],  RPh 10/17/2023,12:16 AM

## 2023-10-17 NOTE — Progress Notes (Signed)
 Progress Note   Patient: [name] WJX:[id number] DOB: 1940-09-28 DOA: 10/13/2023     4 DOS: the patient was seen and examined on 10/17/2023   Brief hospital course: [name] is a 83 yr old woman who presented to the ED on 10/13/2023 with complaints of severe coughing and occasional hemoptysis going on for several months. The patient states that she has been followed by her PCP through this and that she has gone through several rounds of antibiotics for it. In addition to the coughing she has been having chest pain with swallowing, globus sensation in the throat and shortness of breath since January.    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 Her INR was found to be 3.2 this morning. Her coumadin  has been held since 10/13/2023, and she has been given 2.5 mg of vitamin K today. Goal is INR of <1.5.    She has been found to have a large mass in her chest. It is described as a subcarinal soft tissue mass 3.2 x 5.6 with mass effect on the esophagus which is deviated to the left. There is also complete consolidation with volume loss in the right lower lobe with opacification of the bronchus intermedius and the right lower lobe bronchus. This is possibly due to a central obstructing neoplasm.   GI and PCCM consults have been placed. Plan is for bronchoscopy on 10/16/2023. He is on a clear liquid diet. GI has been consulted.   Cardiology has also been consulted due to an elevated troponin of 1600 on admission. This has since downtrended. Cardiology feels that this is due to demand ischemia type II MI. 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 Her chest pain is related to eating. Echocardiogram has demonstrated EF of 25%, global hypokinesis with a moderately dilated LV. Mildly reduced RV function. Prosthetic MV with normal structure and function. It is relatively  unchanged from previous. Cardiology is comfortable that her elevated troponin is not ischemic in nature.   The patient underwent bronchoscopy yesterday. Has been wheezing, started nebulizers q 6 hours scheduled.  Assessment and Plan: Large intrathoracic soft tissue mass Suspected obstructing neoplasm in right lower lobe ?  Postobstructive pneumonia Continue empiric broad-spectrum antibiotic Zosyn .  Leukocytosis improved.  Saturating well on room air Pulmonology recommendations appreciated.  Sputum and blood culutre no growth and MRSA Nares negative  S/p bronchoscopy with EUS today. Biopsies of a circumferential mass pending.  Hypoxia She is off supplemental oxygen. Continue scheduled albuterol  nebs. Monitor for improvement.   Elevated troponin Suspect demand ischemia in the setting of above, EKG non-ischemic.  Monitor on telemetry. Cardiology consult appreciated. Echo reviewed. Resumed Coumadin  as INR lower than goal.   Acute Kidney injury Creatinine worsening. Hold lasix , stopped irbesartan  and spironolactone .   Paroxysmal Afib Continue Metoprolol  succinate 75 mg bid.  INR 1.4 today, she is on Heparin  drip, coumadin  per pharmacy protocol.   Dysphagia Able to tolerate clears, seen by SLP, advanced diet to soft.   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 Echo 10/14/23 with severely reduced ejection fraction EF 25%.  Continue Metoprolol . Aldactone  held due to kidney dysfunction. Pharmacy to dose warfarin, and heparin  drip per protocol.       Out of bed to chair. Incentive  spirometry. Nursing supportive care. Fall, aspiration precautions. Diet:  Diet Orders (From admission, onward)     Start     Ordered   10/17/23 1105  DIET SOFT Room service appropriate? Yes with Assist; Fluid consistency: Thin  Diet effective now       Question Answer  Comment  Room service appropriate? Yes with Assist   Fluid consistency: Thin      10/17/23 1104           DVT prophylaxis:   Level of care: Telemetry Medical   Code Status: Full Code  Subjective: Patient is seen and examined today morning. She has trouble breathing. Eating poor. Denies nausea or vomiting.   Physical Exam: Vitals:   10/17/23 0528 10/17/23 0602 10/17/23 0823 10/17/23 0825  BP: 124/64 124/64 (!) 120/54   Pulse: 84 84 79   Resp: 16  18   Temp: 98.1 F (36.7 C)  98.1 F (36.7 C)   TempSrc:   Oral   SpO2: 98%  98% 98%  Weight:      Height:        General - Elderly thin built Caucasian female, mild respiratory distress HEENT - PERRLA, EOMI, atraumatic head, non tender sinuses. Lung - Clear, basal rhonchi, diffuse wheezes, using accessory muscles. Heart - S1, S2 heard, no murmurs, rubs, no pedal edema. Abdomen - Soft, non tender, bowel sounds good Neuro - Alert, awake and oriented x 3, non focal exam. Skin - Warm and dry.  Data Reviewed:      Latest Ref Rng & Units 10/17/2023    6:42 AM 10/16/2023    8:39 AM 10/15/2023    7:25 AM  CBC  WBC 4.0 - 10.5 K/uL 7.1  13.2  14.2   Hemoglobin 12.0 - 15.0 g/dL 7.9  9.0  9.2   Hematocrit 36.0 - 46.0 % 23.5  26.6  27.9   Platelets 150 - 400 K/uL 249  285  309       Latest Ref Rng & Units 10/17/2023    6:42 AM 10/16/2023    8:39 AM 10/15/2023    7:25 AM  BMP  Glucose 70 - 99 mg/dL 91  91  80   BUN 8 - 23 mg/dL 38  31  24   Creatinine 0.44 - 1.00 mg/dL 4.09  8.11  9.14   Sodium 135 - 145 mmol/L 128  130  132   Potassium 3.5 - 5.1 mmol/L 4.5  3.4  3.9   Chloride 98 - 111 mmol/L 94  94  96   CO2 22 - 32 mmol/L 19  23  21    Calcium  8.9 - 10.3 mg/dL 8.5  9.0  9.3    DG Chest Port 1 View Result Date: 10/16/2023 CLINICAL DATA:  Status post bronchoscopy. EXAM: PORTABLE CHEST 1 VIEW COMPARISON:  Chest radiograph dated 10/13/2023 and CT dated 10/13/2023. FINDINGS: Bilobed of emphysema. Right lower lobe opacity  better evaluated on CT. No pneumothorax. Mild cardiomegaly. Median sternotomy wires and left AICD device. No acute osseous pathology. Bilateral shoulder arthroplasties. IMPRESSION: 1. No pneumothorax. 2. Right lower lobe opacity better evaluated on CT. Electronically Signed   By: Angus Bark M.D.   On: 10/16/2023 12:02    Family Communication: Discussed with patient, she understand and agree. All questions answered.  Disposition: Status is: Inpatient Remains inpatient appropriate because: intrathoracic mass work up, pending path.  Planned Discharge Destination: Home with Home Health     Time spent: 41 minutes  Author: Aisha Hove,  MD 10/17/2023 3:33 PM Secure chat 7am to 7pm For on call review [email or web address].

## 2023-10-17 NOTE — Evaluation (Signed)
 Clinical/Bedside Swallow Evaluation Patient Details  Name: [name] MRN: [id number] Date of Birth: [birth year]  Today's Date: 10/17/2023 Time: SLP Start Time (ACUTE ONLY): 1011 SLP Stop Time (ACUTE ONLY): 1032 SLP Time Calculation (min) (ACUTE ONLY): 21 min  Past Medical History:  Past Medical History:  Diagnosis Date   Allergy    seasonal   Anemia    Anxiety    Arthritis    Asthma    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 Protecta   Biventricular ICD (implantable cardioverter-defibrillator) in place    with CRT   Blood transfusion without reported diagnosis    Bursitis    Cataract    RIGHT EYE   CHF (congestive heart failure) (HCC)    Chronic kidney disease    Colon polyp    adenomatous   Complication of anesthesia    patient stated that had difficulty getting the breathing tube removed, patient said that she stopped breathing and HR dropped to 10  patient then woke up and started breathing pateint stated no longer than one minute; re-intubated in PACU following cholecystectomy 10/28/13   COPD (chronic obstructive pulmonary disease) (HCC)    Depression    Diverticulosis    Dysrhythmia    Fibromyalgia    GERD (gastroesophageal reflux disease)    Gout    H/O mitral valve replacement 2002, 2007   Heart murmur    Hemorrhoids    Hyperlipidemia    Hypertension    Hypothyroidism    IBS (irritable bowel syndrome)    MVP (mitral valve prolapse)    NAUSEA AND VOMITING 08/01/2009   Neuromuscular disorder (HCC)    fibromyalgia   Pacemaker    Peptic ulcer disease    Past Surgical History:  Past Surgical History:  Procedure Laterality Date   ABDOMINAL HYSTERECTOMY     BIV ICD GENERATOR CHANGEOUT N/A 04/29/2017   Procedure: BIV ICD GENERATOR CHANGEOUT;  Surgeon: Luana Rumple, MD;  Location: [facility];  Service: Cardiovascular;  Laterality: N/A;   CARDIAC CATHETERIZATION  02/25/2006   normal left main, normal LAD, normal L  Cfx, normal/dominant RCA (Dr. Aleda Ammon)   CARDIAC DEFIBRILLATOR PLACEMENT  2007, 11/202012   x2 (pacemaker) (Dr. Melven Stable. Croitoru)   CARDIAC VALVE REPLACEMENT  2002   MV repair - Dr. Isla Mari   CHOLECYSTECTOMY N/A 10/28/2013   Procedure: LAPAROSCOPIC CHOLECYSTECTOMY WITH INTRAOPERATIVE CHOLANGIOGRAM;  Surgeon: Quitman Bucy, MD;  Location: [facility] OR;  Service: General;  Laterality: N/A;   COLONOSCOPY     ENTEROSCOPY N/A 11/09/2021   Procedure: ENTEROSCOPY;  Surgeon: Normie Becton., MD;  Location: [facility] ENDOSCOPY;  Service: Gastroenterology;  Laterality: N/A;   ESOPHAGOGASTRODUODENOSCOPY (EGD) WITH PROPOFOL  N/A 03/02/2021   Procedure: ESOPHAGOGASTRODUODENOSCOPY (EGD) WITH PROPOFOL ;  Surgeon: Elois Hair, MD;  Location: [facility] ENDOSCOPY;  Service: Gastroenterology;  Laterality: N/A;   EYE SURGERY     HEMOSTASIS CLIP PLACEMENT  03/02/2021   Procedure: HEMOSTASIS CLIP PLACEMENT;  Surgeon: Elois Hair, MD;  Location: [facility] ENDOSCOPY;  Service: Gastroenterology;;   HEMOSTASIS CLIP PLACEMENT  11/09/2021   Procedure: HEMOSTASIS CLIP PLACEMENT;  Surgeon: Normie Becton., MD;  Location: [facility] ENDOSCOPY;  Service: Gastroenterology;;   HOT HEMOSTASIS  03/02/2021   Procedure: HOT HEMOSTASIS (ARGON PLASMA COAGULATION/BICAP);  Surgeon: Elois Hair, MD;  Location: [facility] ENDOSCOPY;  Service: Gastroenterology;;   HOT HEMOSTASIS N/A 11/09/2021   Procedure: HOT HEMOSTASIS (ARGON PLASMA COAGULATION/BICAP);  Surgeon: Brice Campi Albino Alu., MD;  Location: [facility] ENDOSCOPY;  Service:  Gastroenterology;  Laterality: N/A;   IMPLANTABLE CARDIOVERTER DEFIBRILLATOR (ICD) GENERATOR CHANGE N/A 04/10/2011   Procedure: ICD GENERATOR CHANGE;  Surgeon: Luana Rumple, MD;  Location: [facility] CATH LAB;  Service: Cardiovascular;  Laterality: N/A;   INSERT / REPLACE / REMOVE PACEMAKER     KNEE ARTHROSCOPY     MITRAL VALVE REPLACEMENT  02/26/2006   re-do MVR w/62mm St. [name] (Dr. Cherylynn Cosier)   NM MYOCAR PERF WALL MOTION   2005   persantine myoview - low ris, EF 63%   RIGHT HEART CATH  04/03/2006   pulm cap wedge pressure 24/24, PA pressure 43/22 (mean ), CO 4.8, CI 4.1 (Dr. Jammie Mccune)   RIGHT/LEFT HEART CATH AND CORONARY ANGIOGRAPHY N/A 10/08/2022   Procedure: RIGHT/LEFT HEART CATH AND CORONARY ANGIOGRAPHY;  Surgeon: Millicent Ally, MD;  Location: [facility] INV[facility] CV LAB;  Service: Cardiovascular;  Laterality: N/A;   SCLEROTHERAPY  03/02/2021   Procedure: SCLEROTHERAPY;  Surgeon: Elois Hair, MD;  Location[facility] ENDOSCOPY;  Service: Gastroenterology;;   SUBMUCOSAL TATTOO INJECTION  11/09/2021   Procedure: SUBMUCOSAL TATTOO INJECTION;  Surgeon: Normie Becton., MD;  Location: [facility] ENDOSCOPY;  Service: Gastroenterology;;   TOTAL SHOULDER ARTHROPLASTY Right 12/27/2015   Procedure: RIGHT TOTAL SHOULDER ARTHROPLASTY;  Surgeon: Sammye Cristal, MD;  Location: [facility] OR;  Service: Orthopedics;  Laterality: Right;  Right total shoulder arthroplasty   TOTAL SHOULDER ARTHROPLASTY Left 10/27/2019   Procedure: REVERSE TOTAL SHOULDER ARTHROPLASTY;  Surgeon: Sammye Cristal, MD;  Location: [facility] ORS;  Service: Orthopedics;  Laterality: Left;   TRANSTHORACIC ECHOCARDIOGRAM  12/2011   EF 50-55%, mild global hypokinesis; LA severely dilated; calcification of anterior/posterior MV leaflets, bi-leaflets St. [name] mechanical MV; mild TR; trace AV regurg/pulm valve regurg   HPI:  [name] is a 83 y.o. female who presented to the emergency department for evaluation after coughing fit at home.  Pt is currently seeing pulmonology for evaluation of chronic productive cough with hemoptysis for the past 6 months despite multiple rounds of outpatient antibiotics.  She has had chest pain with swallowing, globus sensation in the throat and shortness of breath since January. She has had recent 20 lb weight loss. She has been found to have a large mass in her chest. It is described as a subcarinal soft tissue mass 3.2 x 5.6 with mass  effect on the esophagus which is deviated to the left. There is also complete consolidation with volume loss in the right lower lobe with opacification of the bronchus intermedius and the right lower lobe bronchus. This is possibly due to a central obstructing neoplasm v post obstructive pna. Bronchoscopy (10/16/23) revealed "Mediastinal adenopathy A circumferential mass was found in the bronchus intermedius". GI consulted. 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    Assessment / Plan / Recommendation  Clinical Impression  Pt presents with no s/sx of oropharyngeal dysphagia/aspiration at bedside this am. She and husband, present at bedside, deny any coughing or choking with POs. She consumes mainly soft meal items at baseline, especially when one or both dentures are not in place. She reports significant globus sensation with meats (of tougher texture) in the mid-lower sternal chest region. Oral mechanism examination during today's evaluation was [facility]. Dentures absent. She self-fed all POs (thin liquids, puree, graham cracker) without overt s/sx of aspiration to follow, including after 3oz water  swallow challenge. 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 She asked for liquids to assist in clearance of solid from oral cavity. Clinical presentation of oropharygneal swallow appears [facility] today and complaints pertaining to swallowing appear more esophageal in nature given reports of globus. Recommend continue current diet of soft solids and thin liquids with adherence to universal swallow/aspiration precuations. GI consult recommended. No SLP f/u indicated at this time and will s/o.  SLP Visit Diagnosis: Dysphagia, unspecified (R13.10)    Aspiration Risk       Diet Recommendation  (continue  soft diet/thin liquids per MD order)    Liquid Administration via: Cup;Straw Medication Administration: Whole meds with liquid Supervision: Patient able to self feed Compensations: Slow rate;Small sips/bites;Follow solids with liquid Postural Changes: Seated upright at 90 degrees;Remain upright for at least 30 minutes after po intake    Other  Recommendations Recommended Consults: Consider esophageal assessment Oral Care Recommendations: Oral care BID    Recommendations for follow up therapy are one component of a multi-disciplinary discharge planning process, led by the attending physician.  Recommendations may be updated based on patient status, additional functional criteria and insurance authorization.  Follow up Recommendations No SLP follow up      Assistance Recommended at Discharge    Functional Status Assessment Patient has not had a recent decline in their functional status  Frequency and Duration            Prognosis Prognosis for improved oropharyngeal function: Good      Swallow Study   General Date of Onset: 10/13/23 HPI: Lashondra Vaquerano is a 83 y.o. female who presented to the emergency department for evaluation after coughing fit at home.  Pt is currently seeing pulmonology for evaluation of chronic productive cough with hemoptysis for the past 6 months despite multiple rounds of outpatient antibiotics.  She has had chest pain with swallowing, globus sensation in the throat and shortness of breath since January. She has had recent 20 lb weight loss. She has been found to have a large mass in her chest. It is described as a subcarinal soft tissue mass 3.2 x 5.6 with mass effect on the esophagus which is deviated to the left. There is also complete consolidation with volume loss in the right lower lobe with opacification of the bronchus intermedius and the right lower lobe bronchus. This is possibly due to a central obstructing neoplasm v post obstructive pna.  Bronchoscopy (10/16/23) revealed "Mediastinal adenopathy A circumferential mass was found in the bronchus intermedius". GI consulted. 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 Type of Study: Bedside Swallow Evaluation Previous Swallow Assessment: none per EMR Diet Prior to this Study:  (soft diet) Temperature Spikes Noted: No Respiratory Status: Room air History of Recent Intubation: No Behavior/Cognition: Alert;Cooperative;Pleasant mood Oral Cavity Assessment: Within Functional Limits Oral Care Completed by SLP: No Oral Cavity - Dentition: Dentures, not available Vision: Functional for self-feeding Self-Feeding Abilities: Able to feed self Patient Positioning: Upright in bed Baseline Vocal Quality: Normal Volitional Cough: Strong Volitional Swallow: Able to elicit    Oral/Motor/Sensory Function Overall Oral Motor/Sensory Function: Within functional limits   Ice Chips Ice chips: Not tested   Thin Liquid Thin Liquid: Within functional limits Presentation: Self Fed;Straw    Nectar Thick Nectar Thick Liquid: Not tested   Honey Thick Honey Thick Liquid: Not tested   Puree Puree: Within functional  limits Presentation: Self Fed;Spoon   Solid     Solid: Impaired Presentation: Self Fed Oral Phase Impairments: Impaired mastication Oral Phase Functional Implications: Impaired mastication       Gordon Latus, MA, CCC-SLP Acute Rehabilitation Services Office Number: [phone]  Corliss Dies 10/17/2023,11:02 AM

## 2023-10-18 ENCOUNTER — Inpatient Hospital Stay (HOSPITAL_COMMUNITY)

## 2023-10-18 ENCOUNTER — Encounter (HOSPITAL_COMMUNITY): Payer: Self-pay | Admitting: Emergency Medicine

## 2023-10-18 DIAGNOSIS — Z952 Presence of prosthetic heart valve: Secondary | ICD-10-CM | POA: Diagnosis not present

## 2023-10-18 DIAGNOSIS — R7989 Other specified abnormal findings of blood chemistry: Secondary | ICD-10-CM | POA: Diagnosis not present

## 2023-10-18 DIAGNOSIS — M7989 Other specified soft tissue disorders: Secondary | ICD-10-CM | POA: Diagnosis not present

## 2023-10-18 DIAGNOSIS — J9859 Other diseases of mediastinum, not elsewhere classified: Secondary | ICD-10-CM

## 2023-10-18 DIAGNOSIS — I480 Paroxysmal atrial fibrillation: Secondary | ICD-10-CM | POA: Diagnosis not present

## 2023-10-18 DIAGNOSIS — N179 Acute kidney failure, unspecified: Secondary | ICD-10-CM

## 2023-10-18 LAB — BASIC METABOLIC PANEL WITH GFR
Anion gap: 15 (ref 5–15)
BUN: 42 mg/dL — ABNORMAL HIGH (ref 8–23)
CO2: 18 mmol/L — ABNORMAL LOW (ref 22–32)
Calcium: 8.5 mg/dL — ABNORMAL LOW (ref 8.9–10.3)
Chloride: 91 mmol/L — ABNORMAL LOW (ref 98–111)
Creatinine, Ser: 3.81 mg/dL — ABNORMAL HIGH (ref 0.44–1.00)
GFR, Estimated: 11 mL/min — ABNORMAL LOW (ref >60)
Glucose, Bld: 109 mg/dL — ABNORMAL HIGH (ref 70–99)
Potassium: 4.5 mmol/L (ref 3.5–5.1)
Sodium: 124 mmol/L — ABNORMAL LOW (ref 135–145)

## 2023-10-18 LAB — BLOOD GAS, VENOUS
Acid-base deficit: 5.1 mmol/L — ABNORMAL HIGH (ref 0.0–2.0)
Bicarbonate: 22 mmol/L (ref 20.0–28.0)
O2 Saturation: 36.5 %
Patient temperature: 36.4
pCO2, Ven: 47 mmHg (ref 44–60)
pH, Ven: 7.28 (ref 7.25–7.43)
pO2, Ven: <31 mmHg — CL (ref 32–45)

## 2023-10-18 LAB — CBC
HCT: 26.6 % — ABNORMAL LOW (ref 36.0–46.0)
Hemoglobin: 8.8 g/dL — ABNORMAL LOW (ref 12.0–15.0)
MCH: 29.9 pg (ref 26.0–34.0)
MCHC: 33.1 g/dL (ref 30.0–36.0)
MCV: 90.5 fL (ref 80.0–100.0)
Platelets: 364 10*3/uL (ref 150–400)
RBC: 2.94 MIL/uL — ABNORMAL LOW (ref 3.87–5.11)
RDW: 14.3 % (ref 11.5–15.5)
WBC: 20.9 10*3/uL — ABNORMAL HIGH (ref 4.0–10.5)
nRBC: 0 % (ref 0.0–0.2)

## 2023-10-18 LAB — CULTURE, BLOOD (ROUTINE X 2)
Culture: NO GROWTH
Culture: NO GROWTH
Special Requests: ADEQUATE

## 2023-10-18 LAB — CULTURE, BAL-QUANTITATIVE W GRAM STAIN: Culture: NO GROWTH

## 2023-10-18 LAB — HEPARIN LEVEL (UNFRACTIONATED)
Heparin Unfractionated: 0.82 [IU]/mL — ABNORMAL HIGH (ref 0.30–0.70)
Heparin Unfractionated: 0.46 [IU]/mL (ref 0.30–0.70)

## 2023-10-18 LAB — PROCALCITONIN: Procalcitonin: 0.84 ng/mL

## 2023-10-18 LAB — MAGNESIUM: Magnesium: 1.5 mg/dL — ABNORMAL LOW (ref 1.7–2.4)

## 2023-10-18 LAB — BRAIN NATRIURETIC PEPTIDE: B Natriuretic Peptide: 1057.2 pg/mL — ABNORMAL HIGH (ref 0.0–100.0)

## 2023-10-18 LAB — PROTIME-INR
INR: 1.7 — ABNORMAL HIGH (ref 0.8–1.2)
Prothrombin Time: 20.6 s — ABNORMAL HIGH (ref 11.4–15.2)

## 2023-10-18 LAB — PHOSPHORUS: Phosphorus: 6.6 mg/dL — ABNORMAL HIGH (ref 2.5–4.6)

## 2023-10-18 MED ORDER — TRANEXAMIC ACID FOR INHALATION
500.0000 mg | Freq: Once | RESPIRATORY_TRACT | Status: AC
  Administered 2023-10-19: 500 mg via RESPIRATORY_TRACT
  Filled 2023-10-18 (×2): qty 10

## 2023-10-18 MED ORDER — WARFARIN 1.25 MG HALF TABLET
1.2500 mg | ORAL_TABLET | Freq: Once | ORAL | Status: AC
  Administered 2023-10-18: 1.25 mg via ORAL
  Filled 2023-10-18: qty 1

## 2023-10-18 MED ORDER — SODIUM CHLORIDE 0.9 % IV SOLN: INTRAVENOUS | Status: AC

## 2023-10-18 MED ORDER — IPRATROPIUM-ALBUTEROL 0.5-2.5 (3) MG/3ML IN SOLN
3.0000 mL | RESPIRATORY_TRACT | Status: AC
  Administered 2023-10-18: 3 mL via RESPIRATORY_TRACT
  Filled 2023-10-18: qty 3

## 2023-10-18 NOTE — Progress Notes (Signed)
 Elink RN made [facility] aware of AM Mag Level via secure chat

## 2023-10-18 NOTE — Progress Notes (Signed)
 Attempted to contact pt's husband per her request. No answer noted on cell or home phone, unable to leave message.

## 2023-10-18 NOTE — Plan of Care (Signed)

## 2023-10-18 NOTE — Consult Note (Signed)
 Renal Service Consult Note [facility]  Nazly Digilio 10/18/2023 Lynae Sandifer, MD Requesting Physician: Dr. Butch Cashing  Reason for Consult: renal failure HPI: The patient is a 83 y.o. year-old w/ PMH as below who presented w/ SOB x 4 months w/ chronic productive cough and hemoptysis, not improving after multiple rounds of po abx. In ED high trops w/ normal EKGs, ^WBC, INR 4.4, stable anemia. 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 Rx'd w/ IV abx, seen by pulm and bronchoscopy done 5/16 w/ biopsies taken. Creat inine was 1.2 on admission and has gradually worsened up to 2.0 on 5.16, 3.7 yesterday and 3.81 today. We are asked to see for renal failure.    Pt seen in hospital room. Pt denies any sig SOB, chest pain or voiding issues  Irbesartan  was given 5/14- 5/15 Toprol  xl daily IV zosyn  since admission IV vanc - one dose 5/13  Brief SBP drop into the 100s in the evening on 5/16 which was post-op bronch done earlier that day; resolved after about 8 hrs  ROS - denies CP, no joint pain, no HA, no blurry vision, no rash, no diarrhea, no nausea/ vomiting  PMH: Anemia Anxiety AICD Chronic systolic heart failure COPD Depression Fibromyalgia GERD Gout H/O mitral valve replacement HL HTN Hypothyroid PUD  Past Surgical History  Past Surgical History:  Procedure Laterality Date   ABDOMINAL HYSTERECTOMY     BIV ICD GENERATOR CHANGEOUT N/A 04/29/2017   Procedure: BIV ICD GENERATOR CHANGEOUT;  Surgeon: Luana Rumple, MD;  Location: [facility] INVASIVE CV LAB;  Service: Cardiovascular;  Laterality: N/A;   CARDIAC CATHETERIZATION  02/25/2006   normal left main, normal LAD, normal L Cfx, normal/dominant RCA (Dr. Aleda Ammon)   CARDIAC DEFIBRILLATOR PLACEMENT  2007, 11/202012   x2 (pacemaker) (Dr. Melven Stable. Croitoru)   CARDIAC VALVE REPLACEMENT  2002   MV repair - Dr. Isla Mari   CHOLECYSTECTOMY N/A 10/28/2013    Procedure: LAPAROSCOPIC CHOLECYSTECTOMY WITH INTRAOPERATIVE CHOLANGIOGRAM;  Surgeon: Quitman Bucy, MD;  Location: [facility] OR;  Service: General;  Laterality: N/A;   COLONOSCOPY     ENTEROSCOPY N/A 11/09/2021   Procedure: ENTEROSCOPY;  Surgeon: Normie Becton., MD;  Location: [facility] ENDOSCOPY;  Service: Gastroenterology;  Laterality: N/A;   ESOPHAGOGASTRODUODENOSCOPY (EGD) WITH PROPOFOL  N/A 03/02/2021   Procedure: ESOPHAGOGASTRODUODENOSCOPY (EGD) WITH PROPOFOL ;  Surgeon: Elois Hair, MD;  Location: [facility] ENDOSCOPY;  Service: Gastroenterology;  Laterality: N/A;   EYE SURGERY     HEMOSTASIS CLIP PLACEMENT  03/02/2021   Procedure: HEMOSTASIS CLIP PLACEMENT;  Surgeon: Elois Hair, MD;  Location: [facility] ENDOSCOPY;  Service: Gastroenterology;;   HEMOSTASIS CLIP PLACEMENT  11/09/2021   Procedure: HEMOSTASIS CLIP PLACEMENT;  Surgeon: Normie Becton., MD;  Location: [facility] ENDOSCOPY;  Service: Gastroenterology;;   HOT HEMOSTASIS  03/02/2021   Procedure: HOT HEMOSTASIS (ARGON PLASMA COAGULATION/BICAP);  Surgeon: Elois Hair, MD;  Location: [facility] ENDOSCOPY;  Service: Gastroenterology;;   HOT HEMOSTASIS N/A 11/09/2021   Procedure: HOT HEMOSTASIS (ARGON PLASMA COAGULATION/BICAP);  Surgeon: Normie Becton., MD;  Location: [facility] ENDOSCOPY;  Service: Gastroenterology;  Laterality: N/A;   IMPLANTABLE CARDIOVERTER DEFIBRILLATOR (ICD) GENERATOR CHANGE N/A 04/10/2011   Procedure: ICD GENERATOR CHANGE;  Surgeon: Luana Rumple, MD;  Location: [facility] CATH LAB;  Service: Cardiovascular;  Laterality: N/A;   INSERT / REPLACE / REMOVE PACEMAKER     KNEE ARTHROSCOPY     MITRAL VALVE REPLACEMENT  02/26/2006  re-do MVR w/81mm St. [name] (Dr. Cherylynn Cosier)   NM MYOCAR PERF WALL MOTION  2005   persantine myoview - low ris, EF 63%   RIGHT HEART CATH  04/03/2006   pulm cap wedge pressure 24/24, PA pressure 43/22 (mean ), CO 4.8, CI 4.1 (Dr. Jammie Mccune)   RIGHT/LEFT HEART CATH AND CORONARY ANGIOGRAPHY N/A  10/08/2022   Procedure: RIGHT/LEFT HEART CATH AND CORONARY ANGIOGRAPHY;  Surgeon: Millicent Ally, MD;  [facility]: [facility] INVASIVE CV LAB;  Service: Cardiovascular;  Laterality: N/A;   SCLEROTHERAPY  03/02/2021   Procedure: SCLEROTHERAPY;  Surgeon: Elois Hair, MD;  Location: [facility] ENDOSCOPY;  Service: Gastroenterology;;   SUBMUCOSAL TATTOO INJECTION  11/09/2021   Procedure: SUBMUCOSAL TATTOO INJECTION;  Surgeon: Normie Becton., MD;  Location: [facility] ENDOSCOPY;  Service: Gastroenterology;;   TOTAL SHOULDER ARTHROPLASTY Right 12/27/2015   Procedure: RIGHT TOTAL SHOULDER ARTHROPLASTY;  Surgeon: Sammye Cristal, MD;  Location: [facility] OR;  Service: Orthopedics;  Laterality: Right;  Right total shoulder arthroplasty   TOTAL SHOULDER ARTHROPLASTY Left 10/27/2019   Procedure: REVERSE TOTAL SHOULDER ARTHROPLASTY;  Surgeon: Sammye Cristal, MD;  Location: [facility] ORS;  Service: Orthopedics;  Laterality: Left;   TRANSTHORACIC ECHOCARDIOGRAM  12/2011   EF 50-55%, mild global hypokinesis; LA severely dilated; calcification of anterior/posterior MV leaflets, bi-leaflets St. [name] mechanical MV; mild TR; trace AV regurg/pulm valve regurg   Family History  Family History  Problem Relation Age of Onset   Breast cancer Sister        multiple   Uterine cancer Sister        multiple   Heart disease Mother    Heart attack Mother    Heart disease Father    Kidney disease Father    Heart attack Father    Heart disease Sister    Crohn's disease Sister    Heart disease Son    Colon cancer Neg Hx    Stomach cancer Neg Hx    Social History  reports that she quit smoking about 22 years ago. Her smoking use included cigarettes. She has never used smokeless tobacco. She reports current alcohol use. She reports that she does not use drugs. Allergies  Allergies  Allergen Reactions   Ace Inhibitors Cough   Lipitor [Atorvastatin Calcium ] Other (See Comments)    Knee pain    Megace [Megestrol] Other (See Comments)     High blood pressure and blurred vision   Aspirin  Other (See Comments)    Nephrologist said to not take this Update 10/07/22 - patient clarifies this recommendation was lumped in with a whole bunch of other medications at that time many years ago when nephrologist told her to stay away from Tylenol , vitamins, ASA. Denies prior allergy or allergic reaction to this   Clorazepate Dipotassium Other (See Comments)    Interacts with a drug being taken   Simvastatin Other (See Comments)    Muscle pain   Tiotropium Bromide Monohydrate Other (See Comments)    Dry mouth   Codeine Nausea And Vomiting   Tape Rash   Home medications Prior to Admission medications   Medication Sig Start Date End Date Taking? Authorizing Provider  acetaminophen  (TYLENOL ) 650 MG CR tablet Take 650 mg by mouth every 8 (eight) hours as needed for pain.   Yes [provider]  allopurinol  (ZYLOPRIM ) 300 MG tablet Take 300 mg by mouth every evening.    Yes [provider]  ALPRAZolam  (XANAX ) 0.25 MG tablet Take 0.25 mg by mouth at bedtime  as needed for anxiety or sleep.    Yes [provider]  antiseptic oral rinse (BIOTENE) LIQD 15 mLs by Mouth Rinse route as needed for dry mouth.   Yes [provider]  buPROPion  (WELLBUTRIN  SR) 150 MG 12 hr tablet Take 150 mg by mouth in the morning.   Yes [provider]  esomeprazole  (NEXIUM ) 40 MG capsule TAKE 1 CAPSULE BY MOUTH EVERY DAY BEFORE BREAKFAST 02/09/23  Yes Esterwood, Amy S, PA-C  furosemide  (LASIX ) 20 MG tablet Take 20 mg by mouth 2 (two) times daily. 09/18/23  Yes [provider]  icosapent  Ethyl (VASCEPA ) 1 g capsule Take 1 capsule (1 g total) by mouth 2 (two) times daily. 09/03/22  Yes Hilty, Aviva Lemmings, MD  ipratropium (ATROVENT ) 0.06 % nasal spray Place 2 sprays into both nostrils daily as needed for rhinitis. 07/07/22  Yes [provider]  levothyroxine  (SYNTHROID ) 75 MCG tablet Take 75 mcg by mouth daily before  breakfast.   Yes [provider]  metoCLOPramide  (REGLAN ) 5 MG tablet Take 5 mg by mouth 2 (two) times daily as needed for nausea or vomiting. 11/05/21  Yes [provider]  metoprolol  succinate (TOPROL -XL) 50 MG 24 hr tablet Take 0.5 tablets (25 mg total) by mouth every morning AND 1 tablet (50 mg total) every evening. Take with or immediately following a meal.. 11/17/22  Yes Croitoru, Mihai, MD  Multiple Vitamin (MULTIVITAMIN WITH MINERALS) TABS tablet Take 1 tablet by mouth daily.   Yes [provider]  pravastatin  (PRAVACHOL ) 40 MG tablet Take 40 mg at bedtime by mouth.    Yes [provider]  PROAIR  HFA 108 (90 BASE) MCG/ACT inhaler Inhale 1-2 puffs into the lungs every 6 (six) hours as needed for wheezing. 01/23/15  Yes [provider]  spironolactone  (ALDACTONE ) 25 MG tablet Take 1 tablet (25 mg total) by mouth daily. 10/31/22  Yes Ruddy Corral M, PA-C  traMADol  (ULTRAM ) 50 MG tablet Take 1 tablet (50 mg total) by mouth every 8 (eight) hours as needed for moderate pain or severe pain (1 tablet every 8 hours as needed for pain). 02/10/23  Yes Croitoru, Mihai, MD  traZODone  (DESYREL ) 50 MG tablet Take 25 mg by mouth at bedtime as needed. 02/04/23  Yes [provider]  TRELEGY ELLIPTA 100-62.5-25 MCG/ACT AEPB Inhale 1 puff into the lungs daily. 10/02/23  Yes [provider]  valsartan  (DIOVAN ) 160 MG tablet Take 1 tablet (160 mg total) by mouth daily. 02/10/23  Yes Croitoru, Mihai, MD  warfarin (COUMADIN ) 2.5 MG tablet TAKE 1 TO 2 TABLETS BY MOUTH DAILY AS DIRECTED BY THE COUMADIN  CLINIC Patient taking differently: Take 1.25-2.5 mg by mouth See admin instructions. Take 1.25mg  by mouth every Friday and Sunday. Take 2.5mg  by mouth all other days of the week. 02/03/23  Yes Avanell Leigh, MD  ferrous sulfate  325 (65 FE) MG tablet Take 1 tablet (325 mg total) by mouth daily with breakfast. Patient not taking: Reported on 10/13/2023 10/15/22    Deforest Fast, MD  furosemide  (LASIX ) 40 MG tablet Take 1 tablet (40 mg total) by mouth daily. Patient not taking: Reported on 10/13/2023 11/17/22   Croitoru, Mihai, MD  Sennosides (SENNA) 15 MG TABS Take 30 mg by mouth at bedtime. Patient not taking: Reported on 10/13/2023    [provider]     Vitals:   10/18/23 1610 10/18/23 0801 10/18/23 0911 10/18/23 1232  BP:  (!) 152/63  (!) 166/66  Pulse: 74 78  75  Resp: 18 18  18   Temp:  (!) 97.4 F (36.3 C)  (!) 97.4 F (36.3 C)  TempSrc:  Oral  Oral  SpO2:  97% 92% 97%  Weight:      Height:       Exam Gen frail, thin, elderly pleasant WF No rash, cyanosis or gangrene Sclera anicteric, throat clear  No jvd or bruits Chest clear bilat to bases, no rales/ wheezing RRR no RG Abd soft ntnd no mass or ascites +bs GU defer MS no joint effusions or deformity Ext no LE or UE edema, no other edema Neuro is alert, Ox 3 , nf, no asterixis     Renal-related home meds: Lasix  20 mg twice daily  Toprol  XL 25 Mg am and 50 Mg evening Aldactone  25 Mg daily Valsartan  160 Mg daily Others: Coumadin , Trelegy Ellipta, trazodone , tramadol , statin, MVI, Reglan , Synthroid , Nexium , Wellbutrin  SR, Xanax , Zyloprim   Date   Creat  eGFR (ml/min) 2008- 2015  1.39- 1.80 2017- 2022  1.24- 1.52 2023   1.14- 1.70 May 2024  1.26- 2.36 Jun- sept 2024 1.25- 1.47 36- 43 ml/min  10/14/23  1.22 10/15/23  2.01 10/16/23  3.18 10/17/23  3.75 10/18/23  3.81   UNa, UCr - pend  UA pending  CXR 5/18 - IMPRESSION: Right base collapse/consolidation with small right pleural effusion, similar to prior.   CTA chest +contrast on 10/13/23 = 75 cc contrast IV    Assessment/ Plan: AKI on CKD 3b: b/l creatinine 1.2-1.5 from mid 2024, eGFR 36-43 ml/min. Creat here was 1.2 on admission, then has increased up to 3.81 today, this in the setting of newly diagnosed lung tumor w/ bronchus obstruction and pneumonia. Rec'd IV contrast w/ CT on 5/13, +ARB x 2 days here and  pta. UA and renal US  pending. No severe hypotension. Suspect AKI due to contrast nephropathy +/- ARB effects. Home ARB, lasix / aldactone  all on hold appropriately. No vol excess on exam, will start IVF"s. Get UA, renal US . Will follow.  Postobstructive PNA: getting IV zosyn , per pmd RLL neoplasm: sp bronchoscopy w/ biopsies taken SP MVR/ chronic systolic HF/ EF 25%: ok to cont toprol  xl as long as bp's remain stable.  Atrial fib: on BB, IV heparin  gtt and coumadin         Larry Poag  MD [facility] 10/18/2023, 2:56 PM  Recent Labs  Lab 10/14/23 0617 10/15/23 0725 10/17/23 0642 10/18/23 0901  HGB 9.5*   < > 7.9* 8.8*  ALBUMIN 2.4*  --   --   --   CALCIUM  9.9   < > 8.5* 8.5*  PHOS  --   --   --  6.6*  CREATININE 1.22*   < > 3.75* 3.81*  K 4.1   < > 4.5 4.5   < > = values in this interval not displayed.   Inpatient medications:  budesonide -glycopyrrolate -formoterol   2 puff Inhalation BID   buPROPion   150 mg Oral q AM   icosapent  Ethyl  1 g Oral BID   levothyroxine   75 mcg Oral Q0600   metoprolol  succinate  25 mg Oral BH-q7a   And   metoprolol  succinate  50 mg Oral QPM   pantoprazole  (PROTONIX ) IV  40 mg Intravenous QHS   pravastatin   40 mg Oral QHS   warfarin  1.25 mg Oral ONCE-1600   Warfarin - Pharmacist Dosing Inpatient   Does not apply q1600    heparin  700 Units/hr (10/18/23 1434)   piperacillin -tazobactam (ZOSYN )  IV 2.25  g (10/18/23 1422)   acetaminophen , albuterol , ALPRAZolam , antiseptic oral rinse, metoCLOPramide , traMADol , traZODone 

## 2023-10-18 NOTE — Progress Notes (Addendum)
 PHARMACY - ANTICOAGULATION CONSULT NOTE  Pharmacy Consult for Warfarin > heparin  bridge Indication: hx mechanical mitral valve  Allergies  Allergen Reactions   Ace Inhibitors Cough   Lipitor [Atorvastatin Calcium ] Other (See Comments)    Knee pain    Megace [Megestrol] Other (See Comments)    High blood pressure and blurred vision   Aspirin  Other (See Comments)    Nephrologist said to not take this Update 10/07/22 - patient clarifies this recommendation was lumped in with a whole bunch of other medications at that time many years ago when nephrologist told her to stay away from Tylenol , vitamins, ASA. Denies prior allergy or allergic reaction to this   Clorazepate Dipotassium Other (See Comments)    Interacts with a drug being taken   Simvastatin Other (See Comments)    Muscle pain   Tiotropium Bromide Monohydrate Other (See Comments)    Dry mouth   Codeine Nausea And Vomiting   Tape Rash    Patient Measurements: Height: 5\' 2"  (157.5 cm) Weight: 42 kg (92 lb 9.5 oz) IBW/kg (Calculated) : 50.1 HEPARIN  DW (KG): 42  Vital Signs: Temp: 97.5 F (36.4 C) (05/18 1725) Temp Source: Oral (05/18 1725) BP: 142/72 (05/18 1725) Pulse Rate: 80 (05/18 1725)  Labs: Recent Labs    10/16/23 0839 10/16/23 2340 10/17/23 0642 10/18/23 0901 10/18/23 1809  HGB 9.0*  --  7.9* 8.8*  --   HCT 26.6*  --  23.5* 26.6*  --   PLT 285  --  249 364  --   LABPROT 15.7*  --  17.0* 20.6*  --   INR 1.2  --  1.4* 1.7*  --   HEPARINUNFRC  --    < > 0.31 0.82* 0.46  CREATININE 3.18*  --  3.75* 3.81*  --    < > = values in this interval not displayed.    Estimated Creatinine Clearance: 7.7 mL/min (A) (by C-G formula based on SCr of 3.81 mg/dL (H)).  Assessment: 4 yof with a history of CAD, HF, s/p ICD, mitral valve prolapse s/p MVR (2007) and PAF, on warfarin PTA. Patient is presenting with SOB. Warfarin per pharmacy consult placed for mechanical valve.  INR 4.4 on admit and warfarin held.   CCM/Pulmonary consulted 5/14 for mediastinal mass and bronchoscopy which was completed 5/16.  PTA warfarin regimen: 2.5 mg daily except 1.25 mg on Fridays and Sundays. Last taken 5/12 at 6pm.  Warfarin had been held since admission on 5/13 - of note did receive vitamin K 2.5 mg IV on 5/14. Warfarin resumed on 5/16 at one time higher dose.  5/18 PM: Repeat heparin  level therapeutic at 0.46 following rate decrease to 700 units/hour. No s/sx of bleeding or infusion issues noted by nursing - appears drawn from opposite arm.  Goal of Therapy:  Heparin  level 0.3-0.7 units/ml INR 2.5-3.5 Monitor platelets by anticoagulation protocol: Yes   Plan:  Continue heparin  at 700 units/h Continue heparin  infusion until INR therapeutic  Next HL in AM Daily heparin  level, PT/INR and CBC.  Thank you for allowing pharmacy to participate in this patient's care,  ADDENDUM: Rn contacted me informing me that patient is coughing up blood. This was happening PTA per patient report, though she feels that it has become more frequent while on heparin . RN notifying MD. Will attempt to target lower end of therapeutic range while waiting for INR to become therapeutic. Hgb up 7.9>>8.8 today, though with all cell lines up, may have some element of hemoconcentration. IVF  started by nephrologist given AKI.  Chrystie Crass, PharmD Clinical Pharmacist  10/18/2023 7:03 PM

## 2023-10-18 NOTE — Progress Notes (Signed)
 Contacted RT Itedal at 0515 and made her aware the provider ordered a STAT neb tx.

## 2023-10-18 NOTE — Progress Notes (Addendum)
   Patient Name: [name] Date of Encounter: 10/18/2023 [facility] Cardiologist: Lauro Portal, MD   Interval Summary  .    No new complaints. Had audible wheezing last night.   Vital Signs .    Vitals:   10/18/23 0355 10/18/23 0601 10/18/23 0619 10/18/23 0801  BP: (!) 150/67 (!) 150/67  (!) 152/63  Pulse: 74 74 74 78  Resp: 16  18 18   Temp:    (!) 97.4 F (36.3 C)  TempSrc:    Oral  SpO2: 100%   97%  Weight:      Height:       No intake or output data in the 24 hours ending 10/18/23 0818    10/14/2023    4:51 AM 06/10/2023    2:07 PM 05/25/2023    1:27 PM  Last 3 Weights  Weight (lbs) 92 lb 9.5 oz 93 lb 9.6 oz 92 lb  Weight (kg) 42 kg 42.457 kg 41.731 kg      Telemetry/ECG  Ventricular pacing- Personally Reviewed  Physical Exam .   GEN: No acute distress.  Thin Neck: No JVD Cardiac: Sharp S1 click. RRR, no murmurs, rubs, or gallops.  Respiratory: Wheeze bilaterally. GI: Soft, nontender, non-distended  MS: No edema  Assessment & Plan .    83 year old with large intrathoracic soft tissue mass  Paroxysmal atrial fibrillation Mechanical mitral valve ICD Chronic systolic heart failure EF 25%   -Hemoglobin 7.9 down from 9.0 - no new labs today -Creatinine up from 2.01 on the 15th to 3.75 - no new labs today -Continuing IV heparin  and warfarin restarted - waiting INR goal with mechanical valve.  -With creatinine recent rising, would hold Lasix  again for today.  Only taking 20 mg p.o. - Status post bronchoscopy on 10/16/2023 -Continue metoprolol  as directed. No change. V pacing.   No new recs for today. Managed per primary team.   For questions or updates, please contact [facility] Please consult [email or web address] for contact info under        Signed, Dorothye Gathers, MD

## 2023-10-18 NOTE — Progress Notes (Signed)
 Progress Note   Patient: [name] ZOX:[id number] DOB: December 09, 1940 DOA: 10/13/2023     5 DOS: the patient was seen and examined on 10/18/2023   Brief hospital course: [name] is a 83 yr old woman who presented to the ED on 10/13/2023 with complaints of severe coughing and occasional hemoptysis going on for several months. The patient states that she has been followed by her PCP through this and that she has gone through several rounds of antibiotics for it. In addition to the coughing she has been having chest pain with swallowing, globus sensation in the throat and shortness of breath since January.    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 Her INR was found to be 3.2 this morning. Her coumadin  has been held since 10/13/2023, and she has been given 2.5 mg of vitamin K today. Goal is INR of <1.5.    She has been found to have a large mass in her chest. It is described as a subcarinal soft tissue mass 3.2 x 5.6 with mass effect on the esophagus which is deviated to the left. There is also complete consolidation with volume loss in the right lower lobe with opacification of the bronchus intermedius and the right lower lobe bronchus. This is possibly due to a central obstructing neoplasm.   GI and PCCM consults have been placed. Plan is for bronchoscopy on 10/16/2023. He is on a clear liquid diet. GI has been consulted.   Cardiology has also been consulted due to an elevated troponin of 1600 on admission. This has since downtrended. Cardiology feels that this is due to demand ischemia type II MI. 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 Her chest pain is related to eating. Echocardiogram has demonstrated EF of 25%, global hypokinesis with a moderately dilated LV. Mildly reduced RV function. Prosthetic MV with normal structure and function. It is relatively  unchanged from previous. Cardiology is comfortable that her elevated troponin is not ischemic in nature.   The patient underwent bronchoscopy yesterday. Has been wheezing, started nebulizers q 6 hours scheduled.  Assessment and Plan: Large intrathoracic soft tissue mass. 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  Postobstructive pneumonia Continue empiric broad-spectrum antibiotic Zosyn .  Leukocytosis improved.  She is on 2L supplemental o2. Sputum and blood culutre no growth and MRSA Nares negative  S/p bronchoscopy with EUS today. Biopsies of a circumferential mass pending. Continue scheduled albuterol  nebs. Monitor pulse ox, maintain saturation above 92%.   Elevated troponin Suspect demand ischemia in the setting of above, EKG non-ischemic.  Monitor on telemetry. Cardiology follow up appreciated. Echo reviewed. Resumed Coumadin  as INR lower than goal.   Acute Kidney injury Creatinine worsening. Last dose lasix  20 mg 5/16, stopped irbesartan  and spironolactone . Nephrology evaluation called for further management.   Paroxysmal Afib Continue Metoprolol  succinate 75 mg bid.  INR 1.5 today, she is on Heparin  drip, coumadin  per pharmacy protocol.   Dysphagia Malnutrition Able to tolerate clears, seen by SLP, advanced diet to soft. She is eating poor. Encourage diet, supplements. Dietician evaluation.   Combined systolic and diastolic heart failure s/p ICD CAD  Mitral valve prolapse status post replacement on Coumadin  Supratherapeutic INR - resolved Hold home dose Lasix  given AKI. Echo 10/14/23 with severely reduced ejection fraction EF 25%.  Continue Metoprolol . Lasix , ARB Aldactone  held due to  kidney dysfunction. Pharmacy to dose warfarin, and heparin  drip per protocol.     Out of bed to chair. Incentive spirometry. Nursing supportive care. Fall, aspiration precautions. Diet:  Diet Orders (From admission, onward)     Start      Ordered   10/17/23 1105  DIET SOFT Room service appropriate? Yes with Assist; Fluid consistency: Thin  Diet effective now       Question Answer Comment  Room service appropriate? Yes with Assist   Fluid consistency: Thin      10/17/23 1104           DVT prophylaxis:  warfarin (COUMADIN ) tablet 1.25 mg  Level of care: Telemetry Medical   Code Status: Full Code Overall prognosis poor. She has multi organ involvement. Palliative care for goals of care.   Subjective: Patient is seen and examined today morning. She is lying in bed. States mild exertion makes her breathing worse. She has audible wheezed. Asks for tylenol  for headache.  Physical Exam: Vitals:   10/18/23 0619 10/18/23 0801 10/18/23 0911 10/18/23 1232  BP:  (!) 152/63  (!) 166/66  Pulse: 74 78  75  Resp: 18 18  18   Temp:  (!) 97.4 F (36.3 C)  (!) 97.4 F (36.3 C)  TempSrc:  Oral  Oral  SpO2:  97% 92% 97%  Weight:      Height:        General - Elderly thin built Caucasian female, mild respiratory distress HEENT - PERRLA, EOMI, atraumatic head, non tender sinuses. Lung - Clear, basal rhonchi, diffuse wheezes, using accessory muscles. Heart - S1, S2 heard, no murmurs, rubs, no pedal edema. Abdomen - Soft, non tender, bowel sounds good Neuro - Alert, awake and oriented x 3, non focal exam. Skin - Warm and dry.  Data Reviewed:      Latest Ref Rng & Units 10/18/2023    9:01 AM 10/17/2023    6:42 AM 10/16/2023    8:39 AM  CBC  WBC 4.0 - 10.5 K/uL 20.9  7.1  13.2   Hemoglobin 12.0 - 15.0 g/dL 8.8  7.9  9.0   Hematocrit 36.0 - 46.0 % 26.6  23.5  26.6   Platelets 150 - 400 K/uL 364  249  285       Latest Ref Rng & Units 10/18/2023    9:01 AM 10/17/2023    6:42 AM 10/16/2023    8:39 AM  BMP  Glucose 70 - 99 mg/dL 409  91  91   BUN 8 - 23 mg/dL 42  38  31   Creatinine 0.44 - 1.00 mg/dL 8.11  9.14  7.82   Sodium 135 - 145 mmol/L 124  128  130   Potassium 3.5 - 5.1 mmol/L 4.5  4.5  3.4   Chloride 98 - 111  mmol/L 91  94  94   CO2 22 - 32 mmol/L 18  19  23    Calcium  8.9 - 10.3 mg/dL 8.5  8.5  9.0    DG CHEST PORT 1 VIEW Result Date: 10/18/2023 CLINICAL DATA: Shortness of breath EXAM: PORTABLE CHEST 1 VIEW COMPARISON:  10/16/2023 FINDINGS: The cardio pericardial silhouette is enlarged. Right base collapse/consolidation is similar to prior with small right pleural effusion. Interstitial coarsening at the left base is similar. Status post cardiac valve replacement. Left-sided permanent pacemaker evident with multiple additional pacer leads overlying the chest. Status post left shoulder replacement. Bones are diffusely demineralized. IMPRESSION: Right base collapse/consolidation with small right pleural  effusion, similar to prior. Electronically Signed   By: Donnal Fusi M.D.   On: 10/18/2023 05:32    Family Communication: Discussed with patient, she understand and agree. All questions answered.  Disposition: Status is: Inpatient Remains inpatient appropriate because: intrathoracic mass work up, pending path, AKI need nephro eval.  Planned Discharge Destination: Home with Home Health     Time spent: 39 minutes  Author: Aisha Hove, MD 10/18/2023 2:14 PM Secure chat 7am to 7pm For on call review [email or web address].

## 2023-10-18 NOTE — Progress Notes (Signed)
 Patient given PRN SAB neb due to ventilatory compromise specific to airway resistance via bronchospastic airways and accessory muscle usage. Noted tracheal tugging on arrival and biphasic wheezing. Patient states neb "helping her breathe easier". Increase respiratory effort has diminished post neb.   [name] L. Felipe Horton, BS, RRT-ACCS, RCP

## 2023-10-18 NOTE — Progress Notes (Addendum)
 Overnight cross coverage for [facility], hospitalist service.  Presented at bedside after report of patient was having audible wheezing.  Chest x-ray obtained.  The patient is alert and oriented, answering questions appropriately.    Diffuse wheezing noted on lungs auscultation.  Her husband is present in the room.  Personally reviewed chest x-ray done in the room.  No evidence of pneumothorax, right lower lobe opacity again seen.  No significant change from prior chest x-ray.  Ordered bronchodilators.  O2 saturation 100% on 2 L [place].  Updated her husband in the room.  Will continue to closely monitor and treat as indicated.  Time: 15 minutes.

## 2023-10-18 NOTE — Progress Notes (Signed)
 PHARMACY - ANTICOAGULATION CONSULT NOTE  Pharmacy Consult for Warfarin > heparin  bridge Indication: hx mechanical mitral valve  Allergies  Allergen Reactions   Ace Inhibitors Cough   Lipitor [Atorvastatin Calcium ] Other (See Comments)    Knee pain    Megace [Megestrol] Other (See Comments)    High blood pressure and blurred vision   Aspirin  Other (See Comments)    Nephrologist said to not take this Update 10/07/22 - patient clarifies this recommendation was lumped in with a whole bunch of other medications at that time many years ago when nephrologist told her to stay away from Tylenol , vitamins, ASA. Denies prior allergy or allergic reaction to this   Clorazepate Dipotassium Other (See Comments)    Interacts with a drug being taken   Simvastatin Other (See Comments)    Muscle pain   Tiotropium Bromide Monohydrate Other (See Comments)    Dry mouth   Codeine Nausea And Vomiting   Tape Rash    Patient Measurements: Height: 5\' 2"  (157.5 cm) Weight: 42 kg (92 lb 9.5 oz) IBW/kg (Calculated) : 50.1 HEPARIN  DW (KG): 42  Vital Signs: Temp: 97.4 F (36.3 C) (05/18 0801) Temp Source: Oral (05/18 0801) BP: 152/63 (05/18 0801) Pulse Rate: 78 (05/18 0801)  Labs: Recent Labs    10/15/23 1932 10/16/23 0839 10/16/23 0839 10/16/23 2340 10/17/23 0642 10/18/23 0901  HGB  --  9.0*   < >  --  7.9* 8.8*  HCT  --  26.6*  --   --  23.5* 26.6*  PLT  --  285  --   --  249 364  LABPROT  --  15.7*  --   --  17.0* 20.6*  INR  --  1.2  --   --  1.4* 1.7*  HEPARINUNFRC 0.13*  --   --  0.35 0.31  --   CREATININE  --  3.18*  --   --  3.75*  --    < > = values in this interval not displayed.    Estimated Creatinine Clearance: 7.8 mL/min (A) (by C-G formula based on SCr of 3.75 mg/dL (H)).  Assessment: 67 yof with a history of CAD, HF, s/p ICD, mitral valve prolapse s/p MVR (2007) and PAF, on warfarin PTA. Patient is presenting with SOB. Warfarin per pharmacy consult placed for mechanical  valve.  INR 4.4 on admit and warfarin held.  CCM/Pulmonary consulted 5/14 for mediastinal mass and bronchoscopy which was completed 5/16.  PTA warfarin regimen: 2.5 mg daily except 1.25 mg on Fridays and Sundays. Last taken 5/12 at 6pm.  Warfarin had been held since admission on 5/13 - of note did receive vitamin K 2.5 mg IV on 5/14. Warfarin resumed on 5/16 at one time higher dose.  Heparin  level this morning came back supratherapeutic at 0.82, on 750 units/hr. Hgb 8.8, plt WNL. INR today came back at 1.7. No s/sx of bleeding or infusion issues noted by nursing - appears drawn from opposite arm.  Goal of Therapy:  Heparin  level 0.3-0.7 units/ml INR 2.5-3.5 Monitor platelets by anticoagulation protocol: Yes   Plan:  Reduce heparin  infusion to 700 units/hr - check heparin  level in 8 hr Continue heparin  infusion until INR therapeutic  Order warfarin 1.25 mg tonight  Daily heparin  level, PT/INR and CBC.  Thank you for allowing pharmacy to participate in this patient's care,  Nieves Bars, PharmD, BCCCP Clinical Pharmacist  Phone: [phone] 10/18/2023 9:30 AM  Please check AMION for all [facility] Pharmacy phone numbers After 10:00  PM, call Main Pharmacy [phone]

## 2023-10-19 ENCOUNTER — Inpatient Hospital Stay (HOSPITAL_COMMUNITY)

## 2023-10-19 ENCOUNTER — Encounter (HOSPITAL_COMMUNITY): Payer: Self-pay | Admitting: Pulmonary Disease

## 2023-10-19 DIAGNOSIS — Z952 Presence of prosthetic heart valve: Secondary | ICD-10-CM | POA: Diagnosis not present

## 2023-10-19 DIAGNOSIS — I480 Paroxysmal atrial fibrillation: Secondary | ICD-10-CM | POA: Diagnosis not present

## 2023-10-19 DIAGNOSIS — J9859 Other diseases of mediastinum, not elsewhere classified: Secondary | ICD-10-CM | POA: Diagnosis not present

## 2023-10-19 DIAGNOSIS — R7989 Other specified abnormal findings of blood chemistry: Secondary | ICD-10-CM | POA: Diagnosis not present

## 2023-10-19 DIAGNOSIS — M7989 Other specified soft tissue disorders: Secondary | ICD-10-CM | POA: Diagnosis not present

## 2023-10-19 DIAGNOSIS — E43 Unspecified severe protein-calorie malnutrition: Secondary | ICD-10-CM

## 2023-10-19 LAB — BASIC METABOLIC PANEL WITH GFR
Anion gap: 12 (ref 5–15)
BUN: 39 mg/dL — ABNORMAL HIGH (ref 8–23)
CO2: 18 mmol/L — ABNORMAL LOW (ref 22–32)
Calcium: 8.4 mg/dL — ABNORMAL LOW (ref 8.9–10.3)
Chloride: 98 mmol/L (ref 98–111)
Creatinine, Ser: 2.92 mg/dL — ABNORMAL HIGH (ref 0.44–1.00)
GFR, Estimated: 16 mL/min — ABNORMAL LOW (ref >60)
Glucose, Bld: 59 mg/dL — ABNORMAL LOW (ref 70–99)
Potassium: 4 mmol/L (ref 3.5–5.1)
Sodium: 128 mmol/L — ABNORMAL LOW (ref 135–145)

## 2023-10-19 LAB — CBC
HCT: 25.2 % — ABNORMAL LOW (ref 36.0–46.0)
Hemoglobin: 8.4 g/dL — ABNORMAL LOW (ref 12.0–15.0)
MCH: 30.4 pg (ref 26.0–34.0)
MCHC: 33.3 g/dL (ref 30.0–36.0)
MCV: 91.3 fL (ref 80.0–100.0)
Platelets: 262 10*3/uL (ref 150–400)
RBC: 2.76 MIL/uL — ABNORMAL LOW (ref 3.87–5.11)
RDW: 14.1 % (ref 11.5–15.5)
WBC: 14.1 10*3/uL — ABNORMAL HIGH (ref 4.0–10.5)
nRBC: 0 % (ref 0.0–0.2)

## 2023-10-19 LAB — PROTIME-INR
INR: 1.9 — ABNORMAL HIGH (ref 0.8–1.2)
Prothrombin Time: 21.9 s — ABNORMAL HIGH (ref 11.4–15.2)

## 2023-10-19 LAB — HEPARIN LEVEL (UNFRACTIONATED): Heparin Unfractionated: 0.3 [IU]/mL (ref 0.30–0.70)

## 2023-10-19 MED ORDER — IPRATROPIUM-ALBUTEROL 0.5-2.5 (3) MG/3ML IN SOLN
3.0000 mL | RESPIRATORY_TRACT | Status: AC
  Filled 2023-10-19: qty 3

## 2023-10-19 MED ORDER — ADULT MULTIVITAMIN W/MINERALS CH
ORAL_TABLET | ORAL | Status: AC
  Filled 2023-10-19: qty 1

## 2023-10-19 MED ORDER — PANTOPRAZOLE SODIUM 40 MG PO TBEC
40.0000 mg | DELAYED_RELEASE_TABLET | Freq: Every day | ORAL | Status: DC
  Administered 2023-10-19 – 2023-10-21 (×3): 40 mg via ORAL
  Filled 2023-10-19 (×2): qty 1

## 2023-10-19 MED ORDER — MAGNESIUM SULFATE 2 GM/50ML IV SOLN
2.0000 g | INTRAVENOUS | Status: AC
  Administered 2023-10-19: 2 g via INTRAVENOUS
  Filled 2023-10-19: qty 50

## 2023-10-19 MED ORDER — MAGNESIUM OXIDE -MG SUPPLEMENT 400 (240 MG) MG PO TABS
ORAL_TABLET | ORAL | Status: AC
Start: 2023-10-19 — End: 2023-10-20
  Filled 2023-10-19: qty 1

## 2023-10-19 MED ORDER — MAGNESIUM OXIDE -MG SUPPLEMENT 400 (240 MG) MG PO TABS
ORAL_TABLET | ORAL | Status: AC
  Filled 2023-10-19: qty 1

## 2023-10-19 MED ORDER — PANTOPRAZOLE SODIUM 40 MG PO TBEC
DELAYED_RELEASE_TABLET | ORAL | Status: AC
  Filled 2023-10-19: qty 1

## 2023-10-19 MED ORDER — ADULT MULTIVITAMIN W/MINERALS CH
1.0000 | ORAL_TABLET | Freq: Every day | ORAL | Status: DC
  Administered 2023-10-19 – 2023-10-22 (×4): 1 via ORAL
  Filled 2023-10-19 (×3): qty 1

## 2023-10-19 MED ORDER — OXYCODONE HCL 5 MG PO TABS
5.0000 mg | ORAL_TABLET | ORAL | Status: AC
  Administered 2023-10-19: 5 mg via ORAL
  Filled 2023-10-19: qty 1

## 2023-10-19 MED ORDER — WARFARIN SODIUM 2.5 MG PO TABS
2.5000 mg | ORAL_TABLET | Freq: Once | ORAL | Status: AC
  Administered 2023-10-19: 2.5 mg via ORAL
  Filled 2023-10-19: qty 1

## 2023-10-19 MED ORDER — ENSURE ENLIVE PO LIQD
237.0000 mL | Freq: Three times a day (TID) | ORAL | Status: DC
  Administered 2023-10-19 – 2023-10-22 (×7): 237 mL via ORAL

## 2023-10-19 MED ORDER — MAGNESIUM OXIDE -MG SUPPLEMENT 400 (240 MG) MG PO TABS
400.0000 mg | ORAL_TABLET | Freq: Two times a day (BID) | ORAL | Status: DC
  Administered 2023-10-19 – 2023-10-22 (×7): 400 mg via ORAL
  Filled 2023-10-19 (×5): qty 1

## 2023-10-19 NOTE — Progress Notes (Signed)
 Progress Note   Patient: [name] [id number] DOB: 1940/10/18 DOA: 10/13/2023     6 DOS: the patient was seen and examined on 10/19/2023   Brief hospital course: [name] is a 83 yr old woman who presented to the ED on 10/13/2023 with complaints of severe coughing and occasional hemoptysis going on for several months. The patient states that she has been followed by her PCP through this and that she has gone through several rounds of antibiotics for it. In addition to the coughing she has been having chest pain with swallowing, globus sensation in the throat and shortness of breath since January.    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 Her INR was found to be 3.2 this morning. Her coumadin  has been held since 10/13/2023, and she has been given 2.5 mg of vitamin K today. Goal is INR of <1.5.    She has been found to have a large mass in her chest. It is described as a subcarinal soft tissue mass 3.2 x 5.6 with mass effect on the esophagus which is deviated to the left. There is also complete consolidation with volume loss in the right lower lobe with opacification of the bronchus intermedius and the right lower lobe bronchus. This is possibly due to a central obstructing neoplasm.   GI and PCCM consults have been placed. Plan is for bronchoscopy on 10/16/2023. He is on a clear liquid diet. GI has been consulted.   Cardiology has also been consulted due to an elevated troponin of 1600 on admission. This has since downtrended. Cardiology feels that this is due to demand ischemia type II MI. 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 Her chest pain is related to eating. Echocardiogram has demonstrated EF of 25%, global hypokinesis with a moderately dilated LV. Mildly reduced RV function. Prosthetic MV with normal structure and function. It is relatively  unchanged from previous. Cardiology is comfortable that her elevated troponin is not ischemic in nature.   The patient underwent bronchoscopy, EBUS 10/16/23. Has been wheezing, started nebulizers q 6 hours scheduled. She is started on heparin  drip as INR dropped with coumadin . She had a fall, head trauma 10/17/23 unremarkable head CT.   Assessment and Plan: Large intrathoracic soft tissue mass. 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  Postobstructive pneumonia Continue empiric broad-spectrum antibiotic Zosyn .  Leukocytosis improved.  She is on 2L supplemental o2. Sputum and blood culutre no growth and MRSA Nares negative  S/p bronchoscopy with EUS today. Biopsies of a circumferential mass pending. Continue scheduled albuterol  nebs. Monitor pulse ox, maintain saturation above 92%.   Elevated troponin Suspect demand ischemia in the setting of above, EKG non-ischemic.  Monitor on telemetry. Cardiology follow up appreciated. Echo reviewed shows low EF. Continue heparin  + Coumadin , INR 1.9 today.   Acute Kidney injury Creatinine better today. Hold Lasix , irbesartan  and spironolactone . Nephrology evaluation appreciated, on gentle IV fluids. Renal ultrasound medical renal disease. Continue to follow daily renal function. Avoid nephrotoxic drugs.  Hyponatremia- Hypovolemic, remains stable. Gentle IV fluids. Trend Na.  Hypomagnesemia- Oral magnesium  supplementation.   Paroxysmal Afib Continue Metoprolol  succinate 75 mg bid.  INR 1.9 today, she is on Heparin  drip, coumadin  per pharmacy protocol.   Dysphagia Severe Malnutrition Able to tolerate clears, seen by SLP, continue soft. She is eating poor. Encourage diet,  supplements. Dietician evaluation called.   Combined systolic and diastolic heart failure s/p ICD CAD  Mitral valve prolapse status post replacement on Coumadin  Supratherapeutic INR - resolved Hold home dose Lasix  given  AKI. Echo 10/14/23 with severely reduced ejection fraction EF 25%.  Continue Metoprolol . Lasix , ARB Aldactone  held due to kidney dysfunction. Pharmacy to dose warfarin, and heparin  drip per protocol.     Out of bed to chair. Incentive spirometry. Nursing supportive care. Fall, aspiration precautions. Diet:  Diet Orders (From admission, onward)     Start     Ordered   10/17/23 1105  DIET SOFT Room service appropriate? Yes with Assist; Fluid consistency: Thin  Diet effective now       Question Answer Comment  Room service appropriate? Yes with Assist   Fluid consistency: Thin      10/17/23 1104           DVT prophylaxis: heparin  drip.  Level of care: Telemetry Medical   Code Status: Full Code Overall prognosis poor. She has multi organ involvement. Palliative care for goals of care.   Subjective: Patient is seen and examined today morning. She is lying in bed. Had fall last night. Head CT unremarkable. She has audible wheezes. Complained of headache.  Physical Exam: Vitals:   10/19/23 0103 10/19/23 0541 10/19/23 0550 10/19/23 0845  BP: 122/62 (!) 148/61  (!) 153/58  Pulse: 84 76  77  Resp:  16  16  Temp:  97.6 F (36.4 C)  (!) 97.5 F (36.4 C)  TempSrc:    Oral  SpO2: 99% 100% 99% 95%  Weight:      Height:        General - Elderly cachectic Caucasian female, in respiratory distress HEENT - PERRLA, EOMI, atraumatic head, non tender sinuses. Lung - Clear, basal rhonchi, diffuse wheezes, using accessory muscles. Heart - S1, S2 heard, no murmurs, rubs, no pedal edema. Abdomen - Soft, non tender, bowel sounds good Neuro - Alert, awake and oriented, non focal exam. Skin - Warm and dry.  Data Reviewed:      Latest Ref Rng & Units 10/19/2023    4:36 AM 10/18/2023    9:01 AM 10/17/2023    6:42 AM  CBC  WBC 4.0 - 10.5 K/uL 14.1  20.9  7.1   Hemoglobin 12.0 - 15.0 g/dL 8.4  8.8  7.9   Hematocrit 36.0 - 46.0 % 25.2  26.6  23.5   Platelets 150 - 400 K/uL 262  364   249       Latest Ref Rng & Units 10/19/2023    4:36 AM 10/18/2023    9:01 AM 10/17/2023    6:42 AM  BMP  Glucose 70 - 99 mg/dL 59  098  91   BUN 8 - 23 mg/dL 39  42  38   Creatinine 0.44 - 1.00 mg/dL 1.19  1.47  8.29   Sodium 135 - 145 mmol/L 128  124  128   Potassium 3.5 - 5.1 mmol/L 4.0  4.5  4.5   Chloride 98 - 111 mmol/L 98  91  94   CO2 22 - 32 mmol/L 18  18  19    Calcium  8.9 - 10.3 mg/dL 8.4  8.5  8.5    CT HEAD WO CONTRAST ( ) Result Date: 10/19/2023 CLINICAL DATA:  Head trauma, coagulopathy (Age 73-64y) Head trauma on coumadin  with INR 1.7 EXAM: CT HEAD WITHOUT CONTRAST TECHNIQUE: Contiguous axial images were obtained from the base of the skull  through the vertex without intravenous contrast. RADIATION DOSE REDUCTION: This exam was performed according to the departmental dose-optimization program which includes automated exposure control, adjustment of the mA and/or kV according to patient size and/or use of iterative reconstruction technique. COMPARISON:  CT head 05/29/2023. FINDINGS: Brain: No evidence of acute infarction, hemorrhage, hydrocephalus, extra-axial collection or mass lesion/mass effect. Advanced patchy white matter hypodensities, nonspecific but compatible with chronic microvascular ischemic disease. Vascular: No hyperdense vessel. Skull: Left posterior scalp hematoma.  No acute fracture. Sinuses/Orbits: Clear sinuses.  No acute orbital findings. IMPRESSION: 1. No evidence of acute intracranial abnormality. 2. Left posterior scalp hematoma. 3. Advanced chronic microvascular ischemic disease. Electronically Signed   By: Stevenson Elbe M.D.   On: 10/19/2023 03:15   US  RENAL Result Date: 10/18/2023 CLINICAL DATA:  [id number] Acute renal failure superimposed on stage 3b chronic kidney disease (HCC) [id number] EXAM: RENAL / URINARY TRACT ULTRASOUND COMPLETE COMPARISON:  Oct 03, 2022 FINDINGS: Evaluation is limited secondary to limited acoustic windows and inability to hold breath.  Right Kidney: Renal measurements: 8.2 x 3.7 x 4.9 cm = volume: 66 mL. Echogenicity is increased. No hydronephrosis visualized. Benign cyst noted measuring 6 mm (for which no dedicated imaging follow-up is recommended). Left Kidney: Renal measurements: 9.6 x 5.7 x 4.7 cm = volume: 134 mL. Echogenicity is mildly increased. No hydronephrosis visualized. Exophytic cyst measuring up to 11 mm, suboptimally assessed If persistent clinical concern for traumatic injury, recommend dedicated cross-sectional imaging. Bladder: Appears normal for degree of bladder distention. Other: RIGHT pleural effusion. IMPRESSION: 1. No hydronephrosis. 2. Increased renal echogenicity as can be seen in medical renal disease. 3. RIGHT pleural effusion. Electronically Signed   By: Clancy Crimes M.D.   On: 10/18/2023 16:56   DG CHEST PORT 1 VIEW Result Date: 10/18/2023 CLINICAL DATA: Shortness of breath EXAM: PORTABLE CHEST 1 VIEW COMPARISON:  10/16/2023 FINDINGS: The cardio pericardial silhouette is enlarged. Right base collapse/consolidation is similar to prior with small right pleural effusion. Interstitial coarsening at the left base is similar. Status post cardiac valve replacement. Left-sided permanent pacemaker evident with multiple additional pacer leads overlying the chest. Status post left shoulder replacement. Bones are diffusely demineralized. IMPRESSION: Right base collapse/consolidation with small right pleural effusion, similar to prior. Electronically Signed   By: Donnal Fusi M.D.   On: 10/18/2023 05:32    Family Communication: Discussed with patient, she understand and agree. All questions answered.  Disposition: Status is: Inpatient Remains inpatient appropriate because: RLL neoplasm, mediastinal mass work up, pending path.  Planned Discharge Destination: Home with Home Health     Time spent: 40 minutes  Author: Aisha Hove, MD 10/19/2023 11:47 AM Secure chat 7am to 7pm For on call review  [email or web address].

## 2023-10-19 NOTE — Progress Notes (Signed)
 [name], pts husband, returned my call and has been notified about the fall.

## 2023-10-19 NOTE — Progress Notes (Signed)
 Pt found in the floor of her room. We heard a thud and ran to her room and found her laying in the floor. Pt stated she tried to get up and go to the bathroom. Vitals stabile, hematoma on back of head and bruise on left elbow, and pt is dizzy. We called rapid response nurse and assisted pt back to bed after vitals taken. Reesa Cannon, DO notified and orders placed. Will continue to monitor.

## 2023-10-19 NOTE — Progress Notes (Signed)
 PHARMACY - ANTICOAGULATION CONSULT NOTE  Pharmacy Consult for Warfarin > heparin  bridge Indication: hx mechanical mitral valve  Allergies  Allergen Reactions   Ace Inhibitors Cough   Lipitor [Atorvastatin Calcium ] Other (See Comments)    Knee pain    Megace [Megestrol] Other (See Comments)    High blood pressure and blurred vision   Aspirin  Other (See Comments)    Nephrologist said to not take this Update 10/07/22 - patient clarifies this recommendation was lumped in with a whole bunch of other medications at that time many years ago when nephrologist told her to stay away from Tylenol , vitamins, ASA. Denies prior allergy or allergic reaction to this   Clorazepate Dipotassium Other (See Comments)    Interacts with a drug being taken   Simvastatin Other (See Comments)    Muscle pain   Tiotropium Bromide Monohydrate Other (See Comments)    Dry mouth   Codeine Nausea And Vomiting   Tape Rash    Patient Measurements: Height: 5\' 2"  (157.5 cm) Weight: 42 kg (92 lb 9.5 oz) IBW/kg (Calculated) : 50.1 HEPARIN  DW (KG): 42  Vital Signs: Temp: 97.5 F (36.4 C) (05/19 0845) Temp Source: Oral (05/19 0845) BP: 153/58 (05/19 0845) Pulse Rate: 77 (05/19 0845)  Labs: Recent Labs    10/17/23 0642 10/18/23 0901 10/18/23 1809 10/19/23 0436  HGB 7.9* 8.8*  --  8.4*  HCT 23.5* 26.6*  --  25.2*  PLT 249 364  --  262  LABPROT 17.0* 20.6*  --  21.9*  INR 1.4* 1.7*  --  1.9*  HEPARINUNFRC 0.31 0.82* 0.46 0.30  CREATININE 3.75* 3.81*  --  2.92*    Estimated Creatinine Clearance: 10 mL/min (A) (by C-G formula based on SCr of 2.92 mg/dL (H)).  Assessment: 46 yof with a history of CAD, HF, s/p ICD, mitral valve prolapse s/p MVR (2007) and PAF, on warfarin PTA. Patient is presenting with SOB. Warfarin per pharmacy consult placed for mechanical valve.  INR 4.4 on admit and warfarin held.  CCM/Pulmonary consulted 5/14 for mediastinal mass and bronchoscopy which was completed 5/16.  PTA  warfarin regimen: 2.5 mg daily except 1.25 mg on Fridays and Sundays. Last taken 5/12 at 6pm.  Warfarin had been held since admission on 5/13 - of note did receive vitamin K 2.5 mg IV on 5/14. Warfarin resumed on 5/16 at one time higher dose.  Heparin  level is therapeutic at 0.3 on 700 units/hr. INR subtherapeutic at 1.9 however trending up. No further coughing up blood noted, Hgb stable 8s, platelets are normal.  Goal of Therapy:  Heparin  level 0.3-0.5 units/ml INR 2.5-3.5 Monitor platelets by anticoagulation protocol: Yes   Plan:  Continue heparin  drip at 700 units/hr Warfarin 2.5 mg PO tonight Continue heparin  infusion until INR therapeutic  Daily heparin  level, PT/INR and CBC Monitor for s/sx of bleeding  Thank you for involving pharmacy in this patient's care.  Caroline Cinnamon, PharmD, BCPS Clinical Pharmacist Clinical phone for 10/19/2023 is [phone] 10/19/2023 12:30 PM

## 2023-10-19 NOTE — Progress Notes (Signed)
 83 year old with large intrathoracic soft tissue mass  Paroxysmal atrial fibrillation Mechanical mitral valve ICD Chronic systolic heart failure EF 25%  -INR currently 1.9.  Goal with mechanical mitral valve is 2.5 (warfarin).  Continue with IV heparin  until at goal. -Hemoglobin 8.4, stable. -Creatinine down to 2.92 from 3.81, improved AKI. -No changes made in medication management currently.  Medications reviewed.  Patient fell last night.  Head CT performed. Sharp S1 click on exam.  Dorothye Gathers, MD

## 2023-10-19 NOTE — Plan of Care (Signed)

## 2023-10-19 NOTE — Progress Notes (Signed)
 Initial Nutrition Assessment  DOCUMENTATION CODES:   Severe malnutrition in context of chronic illness, Underweight  INTERVENTION:  Multivitamin w/ minerals daily Ensure Enlive po BID, each supplement provides 350 kcal and 20 grams of protein. Obtain new weight  NUTRITION DIAGNOSIS:   Severe Malnutrition related to chronic illness as evidenced by severe fat depletion, severe muscle depletion.  GOAL:   Patient will meet greater than or equal to 90% of their needs  MONITOR:   PO intake, Supplement acceptance, Labs, I & O's, Weight trends  REASON FOR ASSESSMENT:   Consult Assessment of nutrition requirement/status  ASSESSMENT:   83 y.o. female presented to the ED with shortness of breath after a coughing fit, this has been ongoing since January. PMH includes CAD, CKD III, HLD, GERD, gout, IBS, COPD, and CHF. Pt admitted with large intrathoracic soft tissue mass.   5/13 - Admitted  5/14 - diet advanced to clear liquids  5/16 - s/p bronchoscopy 5/17 - s/p SLP eval; diet advanced to soft/thin  Pt laying in bed at time of RD visit. Shares that she was nauseous this morning prior to RD entering room. Pt shares that the nausea and vomiting is an ongoing thing for months now. Reports having ~4 small meals per day at home and only able to take a few bites at each meal. Does typically drink 2 Ensures per day at home. Reports that is she eats too much, she will just throw it back up. Discussed RD ordering Ensure while in the hospital and pt is agreeable.   Reports a UBW of 105#, shares that she has always been smaller. Believes the last time that she was her UBW was 15 days ago at her doctors appointment. Endorses weight loss over this time. Per EMR, pt with no weight loss over the past year. Pt with no new weights this admission, will order weight to allow trends while admitted.   Nutrition Related Medications: Protonix , Warfarin, IV antibiotics  Labs: Sodium 128, Potassium 4.0, BUN 39,  Creatinine 2.92   NUTRITION - FOCUSED PHYSICAL EXAM: Flowsheet Row Most Recent Value  Orbital Region Moderate depletion  Upper Arm Region Severe depletion  Thoracic and Lumbar Region Severe depletion  Buccal Region Moderate depletion  Temple Region Moderate depletion  Clavicle Bone Region Severe depletion  Clavicle and Acromion Bone Region Severe depletion  Scapular Bone Region Severe depletion  Dorsal Hand Severe depletion  Patellar Region Severe depletion  Anterior Thigh Region Severe depletion  Posterior Calf Region Severe depletion  Edema (RD Assessment) None  Hair Reviewed  Eyes Reviewed  Mouth Reviewed  Skin Reviewed  Nails Unable to assess  [Nail Polish]   Diet Order:   Diet Order             DIET SOFT Room service appropriate? Yes with Assist; Fluid consistency: Thin  Diet effective now                  EDUCATION NEEDS:  No education needs have been identified at this time  Skin:  Skin Assessment: Reviewed RN Assessment  Last BM:  5/18  Height:  Ht Readings from Last 1 Encounters:  10/14/23 5\' 2"  (1.575 m)   Weight:  Wt Readings from Last 1 Encounters:  10/14/23 42 kg   Ideal Body Weight:  50 kg  BMI:  Body mass index is 16.94 kg/m.  Estimated Nutritional Needs:  Kcal:  1500-1700 Protein:  75-95 grams Fluid:  >/= 1.5 L   Doneta Furbish RD, LDN  Clinical Dietitian

## 2023-10-19 NOTE — Progress Notes (Signed)
 Admit: 10/13/2023 LOS: 6  64F AKI on CKD3b related to IV contrast + ARB  Subjective:  UOP not quantified Creatinine improved from 3.8-2.9, K4.0, sodium improved to 128 Blood pressure stable, afebrile 5/18 renal ultrasound without hydronephrosis or obstruction.  No intake/output data recorded.  Filed Weights   10/14/23 0451  Weight: 42 kg    Scheduled Meds:  budesonide -glycopyrrolate -formoterol   2 puff Inhalation BID   buPROPion   150 mg Oral q AM   icosapent  Ethyl  1 g Oral BID   ipratropium-albuterol   3 mL Nebulization STAT   levothyroxine   75 mcg Oral Q0600   magnesium  oxide  400 mg Oral BID   metoprolol  succinate  25 mg Oral BH-q7a   And   metoprolol  succinate  50 mg Oral QPM   pantoprazole  (PROTONIX ) IV  40 mg Intravenous QHS   pravastatin   40 mg Oral QHS   Warfarin - Pharmacist Dosing Inpatient   Does not apply q1600   Continuous Infusions:  sodium chloride  75 mL/hr at 10/18/23 1837   heparin  700 Units/hr (10/18/23 1434)   piperacillin -tazobactam (ZOSYN )  IV 2.25 g (10/19/23 0542)   PRN Meds:.acetaminophen , albuterol , ALPRAZolam , antiseptic oral rinse, metoCLOPramide , traMADol , traZODone   Current Labs: reviewed   Physical Exam:  Blood pressure (!) 153/58, pulse 77, temperature (!) 97.5 F (36.4 C), temperature source Oral, resp. rate 16, height 5\' 2"  (1.575 m), weight 42 kg, SpO2 95%. NAD elderly female Regular S1 and S2 with crisp S1 No edema Nonfocal Soft, nontender  A AKI on CKD3b:  Likely contrast nephropathy with ARB use Creatinine improving  Ultrasound reassuring No indication for dialysis Continue to monitor Intrathoracic soft tissue mass status post EUS with biopsies and pathology pending Question pneumonia antibiotics per primary Hyponatremia improved here today History of mitral valve replacement on Coumadin  followed by cardiology  P GFR appears to be improving, would be great if we can measure UOP for further confirmation Continue  supportive care, no indication for dialysis Daily weights, Daily Renal Panel, Strict I/Os, Avoid nephrotoxins (NSAIDs, judicious IV Contrast)  Medication Issues; Preferred narcotic agents for pain control are hydromorphone , fentanyl , and methadone. Morphine  should not be used.  Baclofen should be avoided Avoid oral sodium phosphate  and magnesium  citrate based laxatives / bowel preps    Fawn Hooks MD 10/19/2023, 12:26 PM  Recent Labs  Lab 10/17/23 0642 10/18/23 0901 10/19/23 0436  NA 128* 124* 128*  K 4.5 4.5 4.0  CL 94* 91* 98  CO2 19* 18* 18*  GLUCOSE 91 109* 59*  BUN 38* 42* 39*  CREATININE 3.75* 3.81* 2.92*  CALCIUM  8.5* 8.5* 8.4*  PHOS  --  6.6*  --    Recent Labs  Lab 10/17/23 0642 10/18/23 0901 10/19/23 0436  WBC 7.1 20.9* 14.1*  HGB 7.9* 8.8* 8.4*  HCT 23.5* 26.6* 25.2*  MCV 90.4 90.5 91.3  PLT 249 364 262

## 2023-10-19 NOTE — Plan of Care (Signed)
   Problem: Education: Goal: Knowledge of General Education information will improve Description Including pain rating scale, medication(s)/side effects and non-pharmacologic comfort measures Outcome: Progressing   Problem: Health Behavior/Discharge Planning: Goal: Ability to manage health-related needs will improve Outcome: Progressing

## 2023-10-19 NOTE — Progress Notes (Signed)
 Attempted to call [name], husband, but no answer. Home and cell phone were called.

## 2023-10-19 NOTE — Progress Notes (Signed)
 Received a call from bedside regarding the patient falling while attempting to go to the bathroom.  Fall precautions placed.  Presented at bedside.  The patient is alert not in acute distress.  She has a bump on the side of the back of her head.  Stat noncontrast CT head ordered.  As needed analgesics provided.  Will continue to treat and and closely monitor as indicated.   Time: 15 minutes.

## 2023-10-20 ENCOUNTER — Other Ambulatory Visit (HOSPITAL_COMMUNITY): Payer: Self-pay | Admitting: Cardiology

## 2023-10-20 DIAGNOSIS — Z515 Encounter for palliative care: Secondary | ICD-10-CM

## 2023-10-20 DIAGNOSIS — Z952 Presence of prosthetic heart valve: Secondary | ICD-10-CM | POA: Diagnosis not present

## 2023-10-20 DIAGNOSIS — E43 Unspecified severe protein-calorie malnutrition: Secondary | ICD-10-CM | POA: Insufficient documentation

## 2023-10-20 DIAGNOSIS — I480 Paroxysmal atrial fibrillation: Secondary | ICD-10-CM | POA: Diagnosis not present

## 2023-10-20 DIAGNOSIS — J9859 Other diseases of mediastinum, not elsewhere classified: Secondary | ICD-10-CM | POA: Diagnosis not present

## 2023-10-20 DIAGNOSIS — Z7189 Other specified counseling: Secondary | ICD-10-CM | POA: Diagnosis not present

## 2023-10-20 DIAGNOSIS — R7989 Other specified abnormal findings of blood chemistry: Secondary | ICD-10-CM | POA: Diagnosis not present

## 2023-10-20 LAB — PROTIME-INR
INR: 1.9 — ABNORMAL HIGH (ref 0.8–1.2)
Prothrombin Time: 22 s — ABNORMAL HIGH (ref 11.4–15.2)

## 2023-10-20 LAB — BASIC METABOLIC PANEL WITH GFR
Anion gap: 6 (ref 5–15)
BUN: 27 mg/dL — ABNORMAL HIGH (ref 8–23)
CO2: 25 mmol/L (ref 22–32)
Calcium: 9.1 mg/dL (ref 8.9–10.3)
Chloride: 102 mmol/L (ref 98–111)
Creatinine, Ser: 1.89 mg/dL — ABNORMAL HIGH (ref 0.44–1.00)
GFR, Estimated: 26 mL/min — ABNORMAL LOW (ref >60)
Glucose, Bld: 136 mg/dL — ABNORMAL HIGH (ref 70–99)
Potassium: 3.5 mmol/L (ref 3.5–5.1)
Sodium: 133 mmol/L — ABNORMAL LOW (ref 135–145)

## 2023-10-20 LAB — CBC
HCT: 25.8 % — ABNORMAL LOW (ref 36.0–46.0)
Hemoglobin: 8.4 g/dL — ABNORMAL LOW (ref 12.0–15.0)
MCH: 29.6 pg (ref 26.0–34.0)
MCHC: 32.6 g/dL (ref 30.0–36.0)
MCV: 90.8 fL (ref 80.0–100.0)
Platelets: 259 10*3/uL (ref 150–400)
RBC: 2.84 MIL/uL — ABNORMAL LOW (ref 3.87–5.11)
RDW: 14.5 % (ref 11.5–15.5)
WBC: 13.1 10*3/uL — ABNORMAL HIGH (ref 4.0–10.5)
nRBC: 0 % (ref 0.0–0.2)

## 2023-10-20 LAB — HEPARIN LEVEL (UNFRACTIONATED): Heparin Unfractionated: 0.22 [IU]/mL — ABNORMAL LOW (ref 0.30–0.70)

## 2023-10-20 MED ORDER — WARFARIN SODIUM 2.5 MG PO TABS
2.5000 mg | ORAL_TABLET | Freq: Once | ORAL | Status: AC
  Administered 2023-10-20: 2.5 mg via ORAL
  Filled 2023-10-20: qty 1

## 2023-10-20 MED ORDER — SPIRONOLACTONE 25 MG PO TABS: 25.0000 mg | ORAL_TABLET | Freq: Every day | ORAL | 3 refills | Status: DC

## 2023-10-20 NOTE — Progress Notes (Signed)
 PHARMACY - ANTICOAGULATION CONSULT NOTE  Pharmacy Consult for Warfarin > heparin  bridge Indication: hx mechanical mitral valve  Allergies  Allergen Reactions   Ace Inhibitors Cough   Lipitor [Atorvastatin Calcium ] Other (See Comments)    Knee pain    Megace [Megestrol] Other (See Comments)    High blood pressure and blurred vision   Aspirin  Other (See Comments)    Nephrologist said to not take this Update 10/07/22 - patient clarifies this recommendation was lumped in with a whole bunch of other medications at that time many years ago when nephrologist told her to stay away from Tylenol , vitamins, ASA. Denies prior allergy or allergic reaction to this   Clorazepate Dipotassium Other (See Comments)    Interacts with a drug being taken   Simvastatin Other (See Comments)    Muscle pain   Tiotropium Bromide Monohydrate Other (See Comments)    Dry mouth   Codeine Nausea And Vomiting   Tape Rash    Patient Measurements: Height: 5\' 2"  (157.5 cm) Weight: 44 kg (97 lb) IBW/kg (Calculated) : 50.1 HEPARIN  DW (KG): 42  Vital Signs: Temp: 97.4 F (36.3 C) (05/20 0807) Temp Source: Oral (05/20 0431) BP: 162/69 (05/20 0807) Pulse Rate: 80 (05/20 0807)  Labs: Recent Labs    10/18/23 0901 10/18/23 1809 10/19/23 0436 10/20/23 0801  HGB 8.8*  --  8.4* 8.4*  HCT 26.6*  --  25.2* 25.8*  PLT 364  --  262 259  LABPROT 20.6*  --  21.9* 22.0*  INR 1.7*  --  1.9* 1.9*  HEPARINUNFRC 0.82* 0.46 0.30 0.22*  CREATININE 3.81*  --  2.92* 1.89*    Estimated Creatinine Clearance: 16.2 mL/min (A) (by C-G formula based on SCr of 1.89 mg/dL (H)).  Assessment: 83 yo F with a history of CAD, HF, s/p ICD, mitral valve prolapse s/p MVR (2007) and PAF, on warfarin PTA. Patient is presenting with SOB. Warfarin per pharmacy consult placed for mechanical valve.  INR 4.4 on admit and warfarin held.  CCM/Pulmonary consulted 5/14 for mediastinal mass and bronchoscopy which was completed 5/16.  PTA warfarin  regimen: 2.5 mg daily except 1.25 mg on Fridays and Sundays. Last taken 5/12 at 6pm.  Warfarin had been held since admission on 5/13 - of note did receive vitamin K 2.5 mg IV on 5/14. Warfarin resumed on 5/16.  Pt continues on heparin  bridge until INR therapeutic.  Heparin  level is subtherapeutic at 0.22 on 700 units/hr. INR subtherapeutic at 1.9 however trending up. No further coughing up blood noted, Hgb stable 8s, platelets are normal.  Goal of Therapy:  Heparin  level 0.3-0.5 units/ml INR 2.5-3.5 Monitor platelets by anticoagulation protocol: Yes   Plan:  Increase heparin  to 750 units/hr Warfarin 2.5 mg PO tonight Continue heparin  infusion until INR therapeutic  Daily heparin  level, PT/INR and CBC Monitor for s/sx of bleeding  Thank you for involving pharmacy in this patient's care.  Larue Pock, PharmD, BCPS Clinical Pharmacist Clinical phone for 10/20/2023 is [phone] 10/20/2023 1:40 PM

## 2023-10-20 NOTE — Progress Notes (Signed)
 Admit: 10/13/2023 LOS: 7  46F AKI on CKD3b related to IV contrast + ARB  Subjective:  In good spirits this morning UOP not quantified Creatinine further improved to 1.9, K3.5, sodium 133  05/19 0701 - 05/20 0700 In: 1536.9 [I.V.:1140.4; IV Piggyback:396.5] Out: -   Filed Weights   10/14/23 0451 10/20/23 0431  Weight: 42 kg 44 kg    Scheduled Meds:  budesonide -glycopyrrolate -formoterol   2 puff Inhalation BID   buPROPion   150 mg Oral q AM   feeding supplement  237 mL Oral TID BM   icosapent  Ethyl  1 g Oral BID   levothyroxine   75 mcg Oral Q0600   magnesium  oxide  400 mg Oral BID   metoprolol  succinate  25 mg Oral BH-q7a   And   metoprolol  succinate  50 mg Oral QPM   multivitamin with minerals  1 tablet Oral Daily   pantoprazole   40 mg Oral QHS   pravastatin   40 mg Oral QHS   Warfarin - Pharmacist Dosing Inpatient   Does not apply q1600   Continuous Infusions:  heparin  700 Units/hr (10/20/23 0248)   piperacillin -tazobactam (ZOSYN )  IV 2.25 g (10/20/23 0551)   PRN Meds:.acetaminophen , albuterol , ALPRAZolam , antiseptic oral rinse, metoCLOPramide , traMADol , traZODone   Current Labs: reviewed   Physical Exam:  Blood pressure (!) 162/69, pulse 80, temperature (!) 97.4 F (36.3 C), resp. rate 17, height 5\' 2"  (1.575 m), weight 44 kg, SpO2 100%. NAD elderly female Regular S1 and S2 with crisp S1 No edema Nonfocal Soft, nontender  A AKI on CKD3b:  Likely contrast nephropathy with ARB use Creatinine improving  Ultrasound reassuring No indication for dialysis Continue to monitor Intrathoracic soft tissue mass status post EUS with biopsies and pathology pending Question pneumonia antibiotics per primary Hyponatremia improved here today History of mitral valve replacement on Coumadin  followed by cardiology Hypertension  P Continues to recover GFR Continue supportive care, no indication for dialysis Would not resume RAAS inhibitor at this time Consider amlodipine or  BiDil, per cardiology Daily weights, Daily Renal Panel, Strict I/Os, Avoid nephrotoxins (NSAIDs, judicious IV Contrast)  Medication Issues; Preferred narcotic agents for pain control are hydromorphone , fentanyl , and methadone. Morphine  should not be used.  Baclofen should be avoided Avoid oral sodium phosphate  and magnesium  citrate based laxatives / bowel preps   Will sign off for now.  Please call with any questions or concerns.  Pt does not need immediate follow up with nephrology upon discharge.  I think labs can be followed by primary care and other specialists.  For any concerns we will be happy to see her.   Fawn Hooks MD 10/20/2023, 10:33 AM  Recent Labs  Lab 10/18/23 0901 10/19/23 0436 10/20/23 0801  NA 124* 128* 133*  K 4.5 4.0 3.5  CL 91* 98 102  CO2 18* 18* 25  GLUCOSE 109* 59* 136*  BUN 42* 39* 27*  CREATININE 3.81* 2.92* 1.89*  CALCIUM  8.5* 8.4* 9.1  PHOS 6.6*  --   --    Recent Labs  Lab 10/18/23 0901 10/19/23 0436 10/20/23 0801  WBC 20.9* 14.1* 13.1*  HGB 8.8* 8.4* 8.4*  HCT 26.6* 25.2* 25.8*  MCV 90.5 91.3 90.8  PLT 364 262 259

## 2023-10-20 NOTE — Progress Notes (Signed)
 Progress Note   Patient: [name] JXB:[id number] DOB: February 10, 1941 DOA: 10/13/2023     7 DOS: the patient was seen and examined on 10/20/2023   Brief hospital course: Sirinity Outland is a 83 yr old woman who presented to the ED on 10/13/2023 with complaints of severe coughing and occasional hemoptysis going on for several months. The patient states that she has been followed by her PCP through this and that she has gone through several rounds of antibiotics for it. In addition to the coughing she has been having chest pain with swallowing, globus sensation in the throat and shortness of breath since January.    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 Her INR was found to be 3.2 this morning. Her coumadin  has been held since 10/13/2023, and she has been given 2.5 mg of vitamin K today. Goal is INR of <1.5.    She has been found to have a large mass in her chest. It is described as a subcarinal soft tissue mass 3.2 x 5.6 with mass effect on the esophagus which is deviated to the left. There is also complete consolidation with volume loss in the right lower lobe with opacification of the bronchus intermedius and the right lower lobe bronchus. This is possibly due to a central obstructing neoplasm.   GI and PCCM consults have been placed. Plan is for bronchoscopy on 10/16/2023. He is on a clear liquid diet. GI has been consulted.   Cardiology has also been consulted due to an elevated troponin of 1600 on admission. This has since downtrended. Cardiology feels that this is due to demand ischemia type II MI. 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 Her chest pain is related to eating. Echocardiogram has demonstrated EF of 25%, global hypokinesis with a moderately dilated LV. Mildly reduced RV function. Prosthetic MV with normal structure and function. It is relatively  unchanged from previous. Cardiology is comfortable that her elevated troponin is not ischemic in nature.   The patient underwent bronchoscopy, EBUS 10/16/23. Diffuse wheezing, started nebulizers q 6 hours scheduled. She is started on heparin  drip bridge with coumadin . INR subtherapeutic. She had a fall, head trauma 10/17/23 unremarkable head CT. Awaiting path report.  Assessment and Plan: Large intrathoracic soft tissue mass. 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  Postobstructive pneumonia Continue empiric broad-spectrum antibiotic Zosyn .  Leukocytosis improved.  She is on 2L supplemental o2. Sputum and blood culutre no growth and MRSA Nares negative  S/p bronchoscopy with EUS today. Biopsies of a circumferential mass pending. Continue scheduled albuterol  nebs. Monitor pulse ox, maintain saturation above 92%. Discussed with patient and husband at bedside - wishes full interventions. Asked for Dr. Baby Bolt as her oncologist.    Elevated troponin Suspect demand ischemia in the setting of above, EKG non-ischemic.  Monitor on telemetry. Cardiology on board. Cardiology follow up appreciated. Echo reviewed shows low EF. Continue heparin  + Coumadin , INR 1.9 today.   Acute Kidney injury Creatinine better today with fluids. Hold Lasix , irbesartan  and spironolactone . Nephrology follow up appreciated, stop IV fluids. Renal ultrasound medical renal disease. Continue to follow daily renal function. Avoid nephrotoxic drugs.  Hyponatremia- Hypovolemic, remains stable. Na 133 improved with gentle IV fluids.  Hypomagnesemia- Oral magnesium  supplementation.   Paroxysmal Afib Continue Metoprolol  succinate 75 mg bid.  INR 1.9 today, she is  on Heparin  drip, coumadin  per pharmacy protocol.   Dysphagia Severe Malnutrition Able to tolerate clears, seen by SLP, continue soft. She is eating poor. Encourage diet, supplements. Dietician evaluation  called.   Combined systolic and diastolic heart failure s/p ICD CAD  Mitral valve prolapse status post replacement on Coumadin  Supratherapeutic INR - resolved Hold home dose Lasix  given AKI. Echo 10/14/23 with severely reduced ejection fraction EF 25%.  Continue Metoprolol . Lasix , ARB Aldactone  held due to kidney dysfunction. Pharmacy to dose warfarin, and heparin  drip per protocol.     Out of bed to chair. Incentive spirometry. Nursing supportive care. Fall, aspiration precautions. Diet:  Diet Orders (From admission, onward)     Start     Ordered   10/17/23 1105  DIET SOFT Room service appropriate? Yes with Assist; Fluid consistency: Thin  Diet effective now       Question Answer Comment  Room service appropriate? Yes with Assist   Fluid consistency: Thin      10/17/23 1104           DVT prophylaxis: heparin  drip. warfarin (COUMADIN ) tablet 2.5 mg  Level of care: Telemetry Medical   Code Status: Full Code Overall prognosis poor. She has multi organ involvement. Palliative care for goals of care discussion- wishes full interventions.  Subjective: Patient is seen and examined today morning. She is lying in bed, has audible wheezes. Weak, eating poor, has trouble with solid food. Currently on 2L supplemental o2. Husband at bedside.  Physical Exam: Vitals:   10/20/23 0431 10/20/23 0737 10/20/23 0807 10/20/23 1546  BP: (!) 178/65  (!) 162/69 (!) 165/64  Pulse: 86  80 85  Resp: 18  17 17   Temp: 97.7 F (36.5 C)  (!) 97.4 F (36.3 C) (!) 97.5 F (36.4 C)  TempSrc: Oral     SpO2: 100% 98% 100% 99%  Weight: 44 kg     Height:        General - Elderly cachectic Caucasian female, mild respiratory distress HEENT - PERRLA, EOMI, atraumatic head, non tender sinuses. Lung - Clear, basal rhonchi, diffuse wheezes, using accessory muscles. Heart - S1, S2 heard, no murmurs, rubs, no pedal edema. Abdomen - Soft, non tender, bowel sounds good Neuro - Alert, awake and oriented,  non focal exam. Skin - Warm and dry.  Data Reviewed:      Latest Ref Rng & Units 10/20/2023    8:01 AM 10/19/2023    4:36 AM 10/18/2023    9:01 AM  CBC  WBC 4.0 - 10.5 K/uL 13.1  14.1  20.9   Hemoglobin 12.0 - 15.0 g/dL 8.4  8.4  8.8   Hematocrit 36.0 - 46.0 % 25.8  25.2  26.6   Platelets 150 - 400 K/uL 259  262  364       Latest Ref Rng & Units 10/20/2023    8:01 AM 10/19/2023    4:36 AM 10/18/2023    9:01 AM  BMP  Glucose 70 - 99 mg/dL 161  59  096   BUN 8 - 23 mg/dL 27  39  42   Creatinine 0.44 - 1.00 mg/dL 0.45  4.09  8.11   Sodium 135 - 145 mmol/L 133  128  124   Potassium 3.5 - 5.1 mmol/L 3.5  4.0  4.5   Chloride 98 - 111 mmol/L 102  98  91   CO2 22 - 32 mmol/L 25  18  18    Calcium  8.9 - 10.3 mg/dL 9.1  8.4  8.5    CT HEAD WO CONTRAST ( ) Result Date: 10/19/2023 CLINICAL DATA:  Head trauma, coagulopathy (Age 17-64y) Head trauma on coumadin  with INR 1.7 EXAM: CT HEAD WITHOUT CONTRAST TECHNIQUE: Contiguous axial images were obtained from the base of the skull through the vertex without intravenous contrast. RADIATION DOSE REDUCTION: This exam was performed according to the departmental dose-optimization program which includes automated exposure control, adjustment of the mA and/or kV according to patient size and/or use of iterative reconstruction technique. COMPARISON:  CT head 05/29/2023. FINDINGS: Brain: No evidence of acute infarction, hemorrhage, hydrocephalus, extra-axial collection or mass lesion/mass effect. Advanced patchy white matter hypodensities, nonspecific but compatible with chronic microvascular ischemic disease. Vascular: No hyperdense vessel. Skull: Left posterior scalp hematoma.  No acute fracture. Sinuses/Orbits: Clear sinuses.  No acute orbital findings. IMPRESSION: 1. No evidence of acute intracranial abnormality. 2. Left posterior scalp hematoma. 3. Advanced chronic microvascular ischemic disease. Electronically Signed   By: Stevenson Elbe M.D.   On:  10/19/2023 03:15    Family Communication: Discussed with patient, husband at bedside. They understand and agree. All questions answered.  Disposition: Status is: Inpatient Remains inpatient appropriate because: RLL neoplasm, mediastinal mass work up, pending path.  Planned Discharge Destination: Home with Home Health     Time spent: 42 minutes  Author: Aisha Hove, MD 10/20/2023 4:38 PM Secure chat 7am to 7pm For on call review [email or web address].

## 2023-10-20 NOTE — Plan of Care (Signed)
   Problem: Education: Goal: Knowledge of General Education information will improve Description: Including pain rating scale, medication(s)/side effects and non-pharmacologic comfort measures Outcome: Progressing   Problem: Health Behavior/Discharge Planning: Goal: Ability to manage health-related needs will improve Outcome: Progressing   Problem: Nutrition: Goal: Adequate nutrition will be maintained Outcome: Progressing

## 2023-10-20 NOTE — Progress Notes (Signed)
 83 year old with large intrathoracic soft tissue mass-post bronchoscopy Paroxysmal atrial fibrillation Mechanical mitral valve-crisp S1 valve click on exam. ICD Chronic systolic heart failure EF of 25%  Anxious last night. Alert and oriented, in chair.   -INR yesterday 1.9.  Awaiting new value.  On warfarin.  Heparin  IV.  Goal 2.5-3.5.  Mechanical mitral valve. -Creatinine was down yesterday.  Nephrology note reviewed.  Awaiting new labs. -Blood pressure elevated, currently on Toprol  25 mg in the morning 50 mg in the evening.  Recent holding of spironolactone  25 mg as well as valsartan  160 mg secondary to 2 AKI.  Resume when felt comfortable from AKI perspective. -Hyponatremia gently improving. -Troponin 1600 on admission down trended-myocardial injury in the setting of current illness, demand ischemia.    Dorothye Gathers, MD

## 2023-10-20 NOTE — Consult Note (Signed)
 Palliative Medicine Inpatient Consult Note  Consulting Provider:  Aisha Hove, MD   Reason for consult:   Palliative Care Consult Services Palliative Medicine Consult  Reason for Consult? Goals of care   10/20/2023  HPI:  Per intake H&P -->  [name] is a 83 yr old woman who presented to the ED on 10/13/2023 with complaints of severe coughing and occasional hemoptysis going on for several months. 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 Palliative care has been asked to support additional goals of care conversations in the setting of mediastinal mass.   Clinical Assessment/Goals of Care:  *Please note that this is a verbal dictation therefore any spelling or grammatical errors are due to the "[employer] One" system interpretation.  I have reviewed medical records including EPIC notes, labs and imaging, received report from bedside RN, assessed the patient who is sitting at the edge of the bed in NAD.    I met with [name] to further discuss diagnosis prognosis, GOC, EOL wishes, disposition and options.   I introduced Palliative Medicine as specialized medical care for people living with serious illness. It focuses on providing relief from the symptoms and stress of a serious illness. The goal is to improve quality of life for both the patient and the family.  Medical History Review and Understanding:  A review of [name]'s past medical history significant for anemia, anxiety, heart failure, CKD, COPD, fibromyalgia, GERD, hypothyroidism, hypertension, and peptic ulcer disease was completed.  Social History:  [name] is from [place], [place]  though she moved to [place]  with her sisters when she was 49 after divorced.  [name] shares that she has 3 children from her first marriage who are still in [place]  and has 1 child from her second marriage who is local.  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  She shares she has 10 grandchildren and 7 great-grandchildren.  [name] endorses that she and her husband have been married for 44 years and they live a quiet life enjoying movies.  She is a woman of faith practicing within the [employer] denomination.  Functional and Nutritional State:  Preceding hospitalization [name] was living in a single-family home with her husband.  She shares that she had been using a walker and was able to bathe herself, dress herself, help with food preparation.  [name] has had a declining appetite whereby she shares that she does not have a desire to eat.  Advance Directives:  A detailed discussion was had today regarding advanced directives.  [name] does have advanced directives which have been scanned into the electronic medical record.  [name] shares her surrogate decision-maker is her spouse [name].  Code Status:  Concepts specific to code status, artifical feeding and hydration, continued IV antibiotics and rehospitalization was had.  The difference between a aggressive medical intervention path  and a palliative comfort care path for this patient at this time was had.   I encouraged [name] to consider DNR/DNI status understanding evidenced based poor outcomes in similar hospitalized patient, as the cause of arrest is likely associated with advanced chronic/terminal illness rather than an easily reversible acute cardio-pulmonary event. I explained that DNR/DNI does not change the medical plan and it only comes into effect after a person has arrested (died).  It is a protective measure to keep us  from harming the patient in their last moments of life.  Discussion:  I  discussed with [name] her present clinical understanding. She shares that from her understanding she has been coughing for months - often coughing up blood. She shares that she is aware of the mediastinal mass she has. She shares that pathology is pending and is aware  this could be cancer. If this is cancer [name] shares that her husband had lung cancer which was "cured" after four rounds of chemotherapy.   I discussed with [name] her wishes if she were to have cancer. She shares that she would desire all intervention(s) to prolong life. She does draw a limit at life extending measures for an indefinite amount of time. She shares that she would not want to live on machines if her prognosis were poor. She notes the hope that what she has can be treated.  We did discuss the "what if's" regarding if this cannot be treated which [name] is aware of. She however prefers to be optimistic at this time.   From a symptomatic perspective [name] has generalized fatigue and weight loss. She is agreeable to work with PT/OT and dietary.  ______________________________  Addendum:  I spoke to patients husband. He agrees that she would never desire long term measures indefinitely. He attests to being cured of cancer and requests for Dr. Marguerita Shih to care for her if she does have cancer. He states the hope to speak to the primary medical team. I shared that I would reach out to them to provide an update.  Patients spouse wants to understand what the pathology results are and the treatment options from there.   Discussed the importance of continued conversation with family and their  medical providers regarding overall plan of care and treatment options, ensuring decisions are within the context of the patients values and GOCs.  Decision Maker: [name] (Spouse): [phone] (Mobile)   SUMMARY OF RECOMMENDATIONS   Full Code / Full Scope of Care  Discussed with patient her current medical state - she shares optism for treatment even if she does have cancer  Abrey vocalizes wanting any and all treatments to prolong life  Plan to identify what pathology is and for Oncology to speak to [name] more thereafter  Ongoing PMT support  Code Status/Advance Care Planning: FULL  CODE  Palliative Prophylaxis:  Aspiration, Bowel Regimen, Delirium Protocol, Frequent Pain Assessment, Oral Care, Palliative Wound Care, and Turn Reposition  Additional Recommendations (Limitations, Scope, Preferences): Continue current care  Psycho-social/Spiritual:  Desire for further Chaplaincy support: Yes  Additional Recommendations: Education on disease burden.    Prognosis: Limited overall in the setting of chronic disease burden and hypoalbuminemia.  Discharge Planning: Discharge patient is not presently clear.   Vitals:   10/20/23 0737 10/20/23 0807  BP:  (!) 162/69  Pulse:  80  Resp:  17  Temp:  (!) 97.4 F (36.3 C)  SpO2: 98% 100%    Intake/Output Summary (Last 24 hours) at 10/20/2023 1140 Last data filed at 10/19/2023 1845 Gross per 24 hour  Intake 1536.9 ml  Output --  Net 1536.9 ml   Last Weight  Most recent update: 10/20/2023  4:34 AM    Weight  44 kg (97 lb)            Gen:  Elderly Caucasian F cachectic, chronically ill appearing HEENT: moist mucous membranes CV: Regular rate and rhythm  PULM:  On 3LPM [place], breathing is even and nonlabored ABD: soft/nontender  EXT: No edema  Neuro: Alert and oriented x3   PPS: 50-60%   This conversation/these  recommendations were discussed with patient primary care team, Dr. Butch Cashing ______________________________________________________ Camille Cedars [facility] Palliative Medicine Team Team Cell Phone: [phone] Please utilize secure chat with additional questions, if there is no response within 30 minutes please call the above phone number  Time: 10 Billing based on MDM: High  Palliative Medicine Team providers are available by phone from 7am to 7pm daily and can be reached through the team cell phone.  Should this patient require assistance outside of these hours, please call the patient's attending physician.

## 2023-10-21 ENCOUNTER — Institutional Professional Consult (permissible substitution): Payer: Medicare Other | Admitting: Neurology

## 2023-10-21 DIAGNOSIS — Z515 Encounter for palliative care: Secondary | ICD-10-CM | POA: Diagnosis not present

## 2023-10-21 DIAGNOSIS — E43 Unspecified severe protein-calorie malnutrition: Secondary | ICD-10-CM | POA: Diagnosis not present

## 2023-10-21 DIAGNOSIS — C3490 Malignant neoplasm of unspecified part of unspecified bronchus or lung: Secondary | ICD-10-CM

## 2023-10-21 DIAGNOSIS — Z7189 Other specified counseling: Secondary | ICD-10-CM | POA: Diagnosis not present

## 2023-10-21 DIAGNOSIS — M7989 Other specified soft tissue disorders: Secondary | ICD-10-CM | POA: Diagnosis not present

## 2023-10-21 DIAGNOSIS — Z952 Presence of prosthetic heart valve: Secondary | ICD-10-CM | POA: Diagnosis not present

## 2023-10-21 DIAGNOSIS — I480 Paroxysmal atrial fibrillation: Secondary | ICD-10-CM | POA: Diagnosis not present

## 2023-10-21 LAB — BASIC METABOLIC PANEL WITH GFR
Anion gap: 13 (ref 5–15)
BUN: 28 mg/dL — ABNORMAL HIGH (ref 8–23)
CO2: 22 mmol/L (ref 22–32)
Calcium: 10.5 mg/dL — ABNORMAL HIGH (ref 8.9–10.3)
Chloride: 98 mmol/L (ref 98–111)
Creatinine, Ser: 1.45 mg/dL — ABNORMAL HIGH (ref 0.44–1.00)
GFR, Estimated: 36 mL/min — ABNORMAL LOW (ref >60)
Glucose, Bld: 121 mg/dL — ABNORMAL HIGH (ref 70–99)
Potassium: 4.5 mmol/L (ref 3.5–5.1)
Sodium: 133 mmol/L — ABNORMAL LOW (ref 135–145)

## 2023-10-21 LAB — CBC
HCT: 26.6 % — ABNORMAL LOW (ref 36.0–46.0)
Hemoglobin: 8.7 g/dL — ABNORMAL LOW (ref 12.0–15.0)
MCH: 30.5 pg (ref 26.0–34.0)
MCHC: 32.7 g/dL (ref 30.0–36.0)
MCV: 93.3 fL (ref 80.0–100.0)
Platelets: 229 10*3/uL (ref 150–400)
RBC: 2.85 MIL/uL — ABNORMAL LOW (ref 3.87–5.11)
RDW: 14.6 % (ref 11.5–15.5)
WBC: 15.5 10*3/uL — ABNORMAL HIGH (ref 4.0–10.5)
nRBC: 0 % (ref 0.0–0.2)

## 2023-10-21 LAB — CYTOLOGY - NON PAP

## 2023-10-21 LAB — HEPARIN LEVEL (UNFRACTIONATED): Heparin Unfractionated: 0.29 [IU]/mL — ABNORMAL LOW (ref 0.30–0.70)

## 2023-10-21 LAB — MAGNESIUM: Magnesium: 2.3 mg/dL (ref 1.7–2.4)

## 2023-10-21 LAB — PROTIME-INR
INR: 2 — ABNORMAL HIGH (ref 0.8–1.2)
Prothrombin Time: 22.7 s — ABNORMAL HIGH (ref 11.4–15.2)

## 2023-10-21 MED ORDER — WARFARIN SODIUM 4 MG PO TABS
4.0000 mg | ORAL_TABLET | Freq: Once | ORAL | Status: AC
  Administered 2023-10-21: 4 mg via ORAL
  Filled 2023-10-21: qty 1

## 2023-10-21 MED ORDER — THIAMINE MONONITRATE 100 MG PO TABS
100.0000 mg | ORAL_TABLET | Freq: Every day | ORAL | Status: DC
  Administered 2023-10-21 – 2023-10-22 (×2): 100 mg via ORAL
  Filled 2023-10-21 (×2): qty 1

## 2023-10-21 NOTE — Progress Notes (Addendum)
 Nutrition Brief Note  Pt not doing well this morning, seems confused and has been coughing a lot. Husband at bedside reports she has only been eating bites of meals and sips of ensures. Has no appetite. Palliative meant with pt today and pt stated she would not like artifical measures to support her life at this time. Willing to try Magic cups.  INTERVENTION:  Multivitamin w/ minerals daily 100 mg Thiamine daily  Ensure Enlive po BID, each supplement provides 350 kcal and 20 grams of protein. Magic cup TID with meals, each supplement provides 290 kcal and 9 grams of protein   Frederik Jansky, RD Registered Dietitian  See Amion for more information

## 2023-10-21 NOTE — Progress Notes (Signed)
 PROGRESS NOTE    Dnyla Antonetti  ZOX:[id number] DOB: Dec 31, 1940 DOA: 10/13/2023 PCP: Bertha Broad, MD   Brief Narrative: Bathsheba Durrett is a 83 y.o. 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  Patient presented secondary to shortness of breath with dysphagia and found to have evidence of presumed secondary to postobstructive disease in setting of intrathoracic mass concerning for cancer.  Pulmonology consulted for biopsy which was performed on 9/16 with path significant squamous cell carcinoma.  During hospitalization, patient with elevated troponin concerning for ACS; cardiology consulted with recommendation for no as troponin rises secondary to demand ischemia.  Medical oncology consulted for newly diagnosed squamous cell lung cancer.   Assessment and Plan:  Acute respiratory failure with hypoxia In setting of pneumonia and newly diagnosed cancer. Patient managed on supplemental oxygen. Possible patient will not wean off prior to discharge.  Postobstructive pneumonia Presumed secondary to neoplasm. Patient started on Vancomycin /Zosyn , transitioned to Zosyn  IV monotherapy with improvement of symptoms overall.  Squamous cell lung cancer Pulmonology consulted and performed bronchoscopy on 5/16 with biopsies. Pathology revealed squamous cell carcinoma. Palliative care consulted. Patient code status changed from Full Code to DNR-limited. Patient desires to pursue treatment options. -Medical oncology consultation  Dysphagia Likely related to thoracic mass. Speech therapy consulted. -SLP recommendations (5/17): Liquid Administration via: Cup;Straw Medication Administration: Whole meds with liquid Supervision: Patient able to self feed Compensations: Slow rate;Small sips/bites;Follow solids with  liquid Postural Changes: Seated upright at 90 degrees;Remain upright for at least 30 minutes after po intake   Severe malnutrition Possibly related to underlying cancer. Patient also with dysphagia. Dietitian consulted. -Dietitian recommendations (5/21): Multivitamin w/ minerals daily 100 mg Thiamine daily  Ensure Enlive po BID, each supplement provides 350 kcal and 20 grams of protein. Magic cup TID with meals, each supplement provides 290 kcal and 9 grams of protein  AKI on CKD stage IIIb Creatinine of 1 on admission with worsening during admission and peak of 3.81. Renal ultrasound significant for no hydronephrosis. Secondary to contrast nephropathy and ARB use. Nephrology consulted. Creatinine improving. Nephrology recommendations to not resume RAAS inhibitor at this time.   Demand ischemia Patient with troponin elevated and peak of 1,614. Cardiology consulted; per their recommendations, considering past history of CAD, presentation is consistent with demand ischemia.  Hyponatremia Mild. Worsened to 124 with AKI, now improved and stable.  Hypomagnesemia Patient on magnesium  oxide. -Repeat magnesium  blood test  Paroxysmal atrial fibrillation Patient is managed on Coumadin  as an outpatient. With history of mechanical valve, INR range is 2.5-3.5. Heparin  drip started for bridge.  History of mitral valve prolapse s/p mechanica valve replacement Patient managed on Coumadin  as an outpatient. Currently on a heparin  bridge. -Continue Coumadin  and Heparin  IV  CAD Noted. Patient is on Toprol  XL, Coumadin  and valsartan  as an outpatient  Chronic HFrEF Patient is managed on Lasix , spironolactone , valsartan , Toprol  XL as an outpatient. Lasix , spironolactone  and ARB held secondary to AKI. Repeat LVEF of 25% this admission.   DVT prophylaxis: Coumadin  and Heparin  IV Code Status:   Code Status: Full Code Family Communication: None at bedside Disposition Plan: Discharge pending medical  oncology recommendations and therapy recommendations   Consultants:  Cardiology Critical care medicine Nephrology Palliative care medicine Medical oncology  Procedures:  Bronchoscopy  Antimicrobials: Vancomycin  Zosyn     Subjective: No dyspnea. Poor appetite. Hoping to get pathology  results soon.  Objective: BP (!) 164/89 (BP Location: Left Arm)   Pulse 93   Temp 97.9 F (36.6 C)   Resp 20   Ht 5\' 2"  (1.575 m)   Wt 44 kg   SpO2 100%   BMI 17.74 kg/m   Examination:  General exam: Appears calm and comfortable Respiratory system: Rhonchi and coarse breath sounds throughout. Respiratory effort normal. Cardiovascular system: S1 & S2 heard, RRR. No murmurs, rubs, gallops or clicks. Gastrointestinal system: Abdomen is nondistended, soft and nontender. Normal bowel sounds heard. Central nervous system: Alert and oriented. No focal neurological deficits. Psychiatry: Judgement and insight appear normal. Mood & affect appropriate.    Data Reviewed: I have personally reviewed following labs and imaging studies  CBC Lab Results  Component Value Date   WBC 15.5 (H) 10/21/2023   RBC 2.85 (L) 10/21/2023   HGB 8.7 (L) 10/21/2023   HCT 26.6 (L) 10/21/2023   MCV 93.3 10/21/2023   MCH 30.5 10/21/2023   PLT 229 10/21/2023   MCHC 32.7 10/21/2023   RDW 14.6 10/21/2023   LYMPHSABS 0.7 10/09/2022   MONOABS 0.6 10/09/2022   EOSABS 0.3 10/09/2022   BASOSABS 0.0 10/09/2022     Last metabolic panel Lab Results  Component Value Date   NA 133 (L) 10/20/2023   K 3.5 10/20/2023   CL 102 10/20/2023   CO2 25 10/20/2023   BUN 27 (H) 10/20/2023   CREATININE 1.89 (H) 10/20/2023   GLUCOSE 136 (H) 10/20/2023   GFRNONAA 26 (L) 10/20/2023   GFRAA 38 (L) 10/28/2019   CALCIUM  9.1 10/20/2023   PHOS 6.6 (H) 10/18/2023   PROT 6.0 (L) 10/14/2023   ALBUMIN 2.4 (L) 10/14/2023   BILITOT 0.7 10/14/2023   ALKPHOS 74 10/14/2023   AST 30 10/14/2023   ALT 20 10/14/2023   ANIONGAP 6  10/20/2023    GFR: Estimated Creatinine Clearance: 16.2 mL/min (A) (by C-G formula based on SCr of 1.89 mg/dL (H)).  Recent Results (from the past 240 hours)  Blood culture (routine x 2)     Status: None   Collection Time: 10/13/23  5:34 PM   Specimen: BLOOD LEFT ARM  Result Value Ref Range Status   Specimen Description BLOOD LEFT ARM  Final   Special Requests   Final    BOTTLES DRAWN AEROBIC AND ANAEROBIC Blood Culture adequate volume   Culture   Final    NO GROWTH 5 DAYS Performed at [facility], [address] [place], [place] [address]    Report Status 10/18/2023 FINAL  Final  Blood culture (routine x 2)     Status: None   Collection Time: 10/13/23  8:48 PM   Specimen: BLOOD RIGHT FOREARM  Result Value Ref Range Status   Specimen Description BLOOD RIGHT FOREARM  Final   Special Requests   Final    BOTTLES DRAWN AEROBIC AND ANAEROBIC Blood Culture results may not be optimal due to an inadequate volume of blood received in culture bottles   Culture   Final    NO GROWTH 5 DAYS Performed at [facility], [address], [place], [place] [address]    Report Status 10/18/2023 FINAL  Final  MRSA Next Gen by PCR, Nasal     Status: None   Collection Time: 10/14/23  2:06 AM   Specimen: Nasal Mucosa; Nasal Swab  Result Value Ref Range Status   MRSA by PCR Next Gen NOT DETECTED NOT DETECTED Final    Comment: (NOTE)  The GeneXpert MRSA Assay (FDA approved for NASAL specimens only), is one component of a comprehensive MRSA colonization surveillance program. It is not intended to diagnose MRSA infection nor to guide or monitor treatment for MRSA infections. Test performance is not FDA approved in patients less than 17 years old. Performed at [facility], [address], [place], [place] [address]   Culture, BAL-quantitative w Gram Stain     Status: None   Collection Time: 10/16/23 10:21 AM   Specimen: Bronchial Alveolar Lavage; Respiratory  Result Value Ref  Range Status   Specimen Description BRONCHIAL ALVEOLAR LAVAGE  Final   Special Requests BAL bronchus intermedius  Final   Gram Stain   Final    RARE WBC PRESENT, PREDOMINANTLY PMN NO ORGANISMS SEEN    Culture   Final    NO GROWTH 2 DAYS Performed at [facility], [address] [place], [place] [address]    Report Status 10/18/2023 FINAL  Final      Radiology Studies: No results found.    LOS: 8 days    Aneita Keens, MD [facility] 10/21/2023, 8:22 AM   If 7PM-7AM, please contact night-coverage [email or web address]

## 2023-10-21 NOTE — Plan of Care (Signed)
 Assumed care at 1900. Pt has been restless and confused overnight. Appears delirium could be settling. Pt has been Aox2. Pt has been hallucinating overnight. Pt states "did those kids go to bed. That looks like a dead end over there." Pt has been standing on the side of the bed often overnight. Pt was able to get a nap. However, once she awoken she had removed her IV, tele leads, and sheets off of the bed. Fall precautions in place. No other events at this time.    Problem: Health Behavior/Discharge Planning: Goal: Ability to manage health-related needs will improve Outcome: Not Progressing   Problem: Clinical Measurements: Goal: Ability to maintain clinical measurements within normal limits will improve Outcome: Not Progressing Goal: Will remain free from infection Outcome: Not Progressing Goal: Diagnostic test results will improve Outcome: Not Progressing Goal: Respiratory complications will improve Outcome: Not Progressing Goal: Cardiovascular complication will be avoided Outcome: Not Progressing   Problem: Nutrition: Goal: Adequate nutrition will be maintained Outcome: Not Progressing   Problem: Coping: Goal: Level of anxiety will decrease Outcome: Not Progressing   Problem: Safety: Goal: Ability to remain free from injury will improve Outcome: Not Progressing   Problem: Skin Integrity: Goal: Risk for impaired skin integrity will decrease Outcome: Not Progressing

## 2023-10-21 NOTE — Progress Notes (Signed)
 INR 2.0 today.  Goal 2.5-3.5 with mechanical mitral valve. EF 25%. ARB, Aldactone , Lasix  previously held secondary to kidney dysfunction.  Dorothye Gathers, MD

## 2023-10-21 NOTE — Plan of Care (Signed)

## 2023-10-21 NOTE — Hospital Course (Addendum)
 83 year old woman PMH including CAD, PAF, combined systolic and diastolic CHF, status post ICD, presenting with shortness of breath, chest pain, globus sensation, coughing.  Initial evaluation revealed markedly elevated troponin, CT chest showed large soft tissue mass in subcarinal region with mass effect on mid thoracic esophagus.  Admitted for further evaluation, seen by pulmonology and underwent bronchoscopy.  Biopsies revealed squamous cell carcinoma.  Seen by palliative medicine and briefly by oncology.  Patient was transferred to [facility] for initiation of urgent radiation therapy, however prior to beginning therapy the patient decompensated was transferred to the ICU.  After further discussion with family, the decision was made to transition to full comfort care.  In-hospital death anticipated.    Consultants Cardiology Pulmonology Palliative medicine  Procedures/Events 5/16 bronchoscopy showed circumferential mass bronchus intermedius

## 2023-10-21 NOTE — Progress Notes (Signed)
 PHARMACY - ANTICOAGULATION CONSULT NOTE  Pharmacy Consult for Warfarin > heparin  bridge Indication: hx mechanical mitral valve  Allergies  Allergen Reactions   Ace Inhibitors Cough   Lipitor [Atorvastatin Calcium ] Other (See Comments)    Knee pain    Megace [Megestrol] Other (See Comments)    High blood pressure and blurred vision   Aspirin  Other (See Comments)    Nephrologist said to not take this Update 10/07/22 - patient clarifies this recommendation was lumped in with a whole bunch of other medications at that time many years ago when nephrologist told her to stay away from Tylenol , vitamins, ASA. Denies prior allergy or allergic reaction to this   Clorazepate Dipotassium Other (See Comments)    Interacts with a drug being taken   Simvastatin Other (See Comments)    Muscle pain   Tiotropium Bromide Monohydrate Other (See Comments)    Dry mouth   Codeine Nausea And Vomiting   Tape Rash    Patient Measurements: Height: 5\' 2"  (157.5 cm) Weight: 44 kg (97 lb) IBW/kg (Calculated) : 50.1 HEPARIN  DW (KG): 42  Vital Signs: Temp: 97.9 F (36.6 C) (05/21 0753) Temp Source: Oral (05/21 0505) BP: 165/55 (05/21 1025) Pulse Rate: 83 (05/21 1025)  Labs: Recent Labs    10/19/23 0436 10/20/23 0801 10/21/23 0601 10/21/23 0905  HGB 8.4* 8.4* 8.7*  --   HCT 25.2* 25.8* 26.6*  --   PLT 262 259 229  --   LABPROT 21.9* 22.0* 22.7*  --   INR 1.9* 1.9* 2.0*  --   HEPARINUNFRC 0.30 0.22* 0.29*  --   CREATININE 2.92* 1.89*  --  1.45*    Estimated Creatinine Clearance: 21.1 mL/min (A) (by C-G formula based on SCr of 1.45 mg/dL (H)).  Assessment: 83 yo F with a history of CAD, HF, s/p ICD, mitral valve prolapse s/p MVR (2007) and PAF, on warfarin PTA. Patient is presenting with SOB. Warfarin per pharmacy consult placed for mechanical valve.  INR 4.4 on admit and warfarin held.  CCM/Pulmonary consulted 5/14 for mediastinal mass and bronchoscopy which was completed 5/16.  PTA warfarin  regimen: 2.5 mg daily except 1.25 mg on Fridays and Sundays. Last taken 5/12 at 6pm.  Warfarin had been held since admission on 5/13 - of note did receive vitamin K 2.5 mg IV on 5/14. Warfarin resumed on 5/16.  Pt continues on heparin  bridge until INR therapeutic.  Heparin  level is subtherapeutic at 0.29 on 750 units/hr. INR subtherapeutic at 2 with little movement. Will increase dose today.  No further coughing up blood noted, Hgb stable 8s, platelets are normal.  Goal of Therapy:  Heparin  level 0.3-0.5 units/ml INR 2.5-3.5 Monitor platelets by anticoagulation protocol: Yes   Plan:  Increase heparin  to 800 units/hr Warfarin 4 mg PO tonight Continue heparin  infusion until INR therapeutic  Daily heparin  level, PT/INR and CBC Monitor for s/sx of bleeding  Thank you for involving pharmacy in this patient's care.  Larue Pock, PharmD, BCPS Clinical Pharmacist Clinical phone for 10/21/2023 is [phone] 10/21/2023 1:30 PM

## 2023-10-21 NOTE — Progress Notes (Signed)
 Palliative Medicine Inpatient Follow Up Note HPI: [name] is a 83 yr old woman who presented to the ED on 10/13/2023 with complaints of severe coughing and occasional hemoptysis going on for several months. 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 Palliative care has been asked to support additional goals of care conversations in the setting of mediastinal mass.   Today's Discussion 10/21/2023  *Please note that this is a verbal dictation therefore any spelling or grammatical errors are due to the "[employer] One" system interpretation.  Chart reviewed inclusive of vital signs, progress notes, laboratory results, and diagnostic images.   [name] is severely ill in the setting of  disease burden from  large intrathoracic soft tissue mass.    I met with [name] at bedside this morning. She is more wheezy and noted to be having a harder time with her breathing. I was able to get a set of VS - reached out to her primary, Dr. Duard Getting to inform him. I placed her oxygen which had fallen out of her nose back in which improved her condition. Asked RT to provide a breathing treatment.   She and I discussed her present health in the setting of her new cancer diagnosis. She shares that she sat and thought about her health at length overnight. She notes that she is coming to terms with the reality of her illness.  From there perspective of resuscitation, [name] has decided to be a DNAR/DNI. She shares that she does not want to be on artificial measures to support her life if it is her time. I verified this with her again in the presence of her spouse, [name] via speaker phone. [name] shares that he would like to honor whatever [name] wants while she is in her right mind.   Created space and opportunity for patient to explore thoughts feelings and fears regarding her current medical situation. She endorses  hope for improvement(s) and treatment.   Questions and concerns addressed/Palliative Support Provided.   Objective Assessment: Vital Signs Vitals:   10/21/23 0753 10/21/23 1025  BP: (!) 164/89 (!) 165/55  Pulse: 93 83  Resp:  20  Temp: 97.9 F (36.6 C)   SpO2: 100% 100%    Intake/Output Summary (Last 24 hours) at 10/21/2023 1237 Last data filed at 10/21/2023 1200 Gross per 24 hour  Intake --  Output 200 ml  Net -200 ml   Last Weight  Most recent update: 10/20/2023  4:34 AM    Weight  44 kg (97 lb)            Gen:  Elderly Caucasian F cachectic, chronically ill appearing HEENT: moist mucous membranes CV: Regular rate and rhythm  PULM:  On 3LPM [place], breathing is even and nonlabored ABD: soft/nontender  EXT: No edema  Neuro: Alert and oriented x3   SUMMARY OF RECOMMENDATIONS   DNAR/DNI  Continue present care allowing time for outcomes  Plan to for [facility] to speak to the Oncology team in regards to treatment options s/p biopsy  Ongoing PMT support ______________________________________________________________________________________ [facility] Palliative Medicine Team Team Cell Phone: [phone] Please utilize secure chat with additional questions, if there is no response within 30 minutes please call the above phone number  Time Spent: 50 Billing based on MDM: High  Palliative Medicine Team providers are available by phone from 7am to 7pm daily and can be reached through the team  cell phone.  Should this patient require assistance outside of these hours, please call the patient's attending physician.

## 2023-10-21 NOTE — Consult Note (Addendum)
 [facility] CONSULT NOTE  Patient Care Team: Bertha Broad, MD as PCP - General (Internal Medicine) Boyce Byes, MD as PCP - Electrophysiology (Cardiology) Avanell Leigh, MD as PCP - Cardiology (Cardiology) Deterding, Royston Cornea, MD as Consulting Physician (Nephrology)  CHIEF COMPLAINTS/PURPOSE OF CONSULTATION:  Squamous cell Lung cancer  REFERRING PHYSICIAN: Dr. Duard Getting  HISTORY OF PRESENTING ILLNESS:  [name] 83 y.o. female who presented to the ED on 10/13/23 with complaints of shortness of breath.  Work-up was initially significant for elevated troponin with Cardio following closely.  Scans showed soft tissue mass suspicious for lung neoplasm.  Cytology confirmed squamous cell carcinoma therefore oncology consult requested.  Patient seen awake and alert with spouse at bedside.  She is thin and cachectic.  Patient and spouse both detail complaints as being "breathing not good".  Spouse details that he noticed shortness of breath earlier in January 2025 and noted persistent cough.  Subsequently seen by PCP for several visits, patient had approx 6 weeks of antibiotics with resolution of the cough.  However she continued to become increasingly weak and lose weight.  States no appetite and difficult to swallow.  Denies chest pain, bleeding, dizziness or lightheadedness.  PCP sent her to see Lung Specialist 3 or 4 weeks ago who they state told her she had "fluid on her lungs". Medical history includes anemia, CKD, COPD, MVP among several other chronic diagnoses. Patient on coumadin  and goes to Coumadin  clinic every two weeks.  Surgical history significant for hysterectomy, mitral valve repair, and cardiac cath.  Family  history includes sister with breast cancer and uterine cancer.  Social history significant for 50+year history of tobacco use, started in her 51s, quit 4-5 years ago.  Admits to social alcohol use.  Denies illicit and recreational drug use. Denies  occupational hazardous exposure, worked as a Diplomatic Services operational officer.     I have reviewed her chart and materials related to her cancer extensively and collaborated history with the patient. Summary of oncologic history is as follows: Oncology History   No history exists.    ASSESSMENT & PLAN:  Squamous cell carcinoma, right lung with adrenal mets -- CT chest done 10/13/23 shows large soft tissue mass in the subcarinal region, compatible with malignancy or adenopathy.  Nonspecific bil adrenal nodularity, metastatic disease not excluded.  -- Cytology done 5/16 confirms squamous cell carcinoma.  -- Agree with Palliative for goals of care discussions -- Medical Oncology/Dr. Eulala Newcombe will see patient during hospitalization.  Dr. Marguerita Shih will follow patient upon discharge.   Shortness of breath -- likely secondary to lung malignancy -- continue respiratory therapy as ordered -- Pulm following -- continue supportive care  Anemia, normocytic -- likely multifactorial due to malignancy, renal impairment -- HGB low 8.7, baseline appears to be in 8 range -- Transfuse PRBC for HGB<7.0, no transfusional intervention required at this time. -- continue to monitor CBC with diff  AKI/CKD -- elevated creatinine and BUN -- likely contributing factor to anemia -- avoid nephrotoxic agents -- monitor renal function closely -- Nephrology consulted  CAD A-fib Heart Failure -- status post ICD -- on coumadin  -- monitor for bleeding -- Cardio following  Severe malnutrition Dysphagia -- likely secondary to malignancy -- continue nutrition repletion and supportive care -- Dietitian following closely   MEDICAL HISTORY:  Past Medical History:  Diagnosis Date   Allergy    seasonal   Anemia    Anxiety    Arthritis    Asthma  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 Protecta   Biventricular ICD (implantable cardioverter-defibrillator) in place    with CRT   Blood  transfusion without reported diagnosis    Bursitis    Cataract    RIGHT EYE   CHF (congestive heart failure) (HCC)    Chronic kidney disease    Colon polyp    adenomatous   Complication of anesthesia    patient stated that had difficulty getting the breathing tube removed, patient said that she stopped breathing and HR dropped to 10  patient then woke up and started breathing pateint stated no longer than one minute; re-intubated in PACU following cholecystectomy 10/28/13   COPD    Depression    Diverticulosis    Dysrhythmia    Fibromyalgia    GERD (gastroesophageal reflux disease)    Gout    H/O mitral valve replacement 2002, 2007   Heart murmur    Hemorrhoids    Hyperlipidemia    Hypertension    Hypothyroidism    IBS (irritable bowel syndrome)    MVP (mitral valve prolapse)    NAUSEA AND VOMITING 08/01/2009   Neuromuscular disorder (HCC)    fibromyalgia   Pacemaker    Peptic ulcer disease     SURGICAL HISTORY: Past Surgical History:  Procedure Laterality Date   ABDOMINAL HYSTERECTOMY     BIV ICD GENERATOR CHANGEOUT N/A 04/29/2017   Procedure: BIV ICD GENERATOR CHANGEOUT;  Surgeon: Luana Rumple, MD;  Location: [facility] INVAS[facility];  Service: Cardiovascular;  Laterality: N/A;   BRONCHIAL BIOPSY  10/16/2023   Procedure: BRONCHOSCOPY, WITH BIOPSY;  Surgeon: Margaretann Sharper, MD;  Location: [facility] ENDOSCOPY;  Service: Pulmonary;;   BRONCHIAL WASHINGS  10/16/2023   Procedure: IRRIGATION, BRONCHUS;  Surgeon: Margaretann Sharper, MD;  Location: [facility] ENDOSCOPY;  Service: Pulmonary;;   CARDIAC CATHETERIZATION  02/25/2006   normal left main, normal LAD, normal L Cfx, normal/dominant RCA (Dr. Aleda Ammon)   CARDIAC DEFIBRILLATOR PLACEMENT  2007, 11/202012   x2 (pacemaker) (Dr. Melven Stable. Croitoru)   CARDIAC VALVE REPLACEMENT  2002   MV repair - Dr. Isla Mari   CHOLECYSTECTOMY N/A 10/28/2013   Procedure: LAPAROSCOPIC CHOLECYSTECTOMY WITH INTRAOPERATIVE CHOLANGIOGRAM;  Surgeon: Quitman Bucy, MD;  Location: [facility] OR;  Service: General;  Laterality: N/A;   COLONOSCOPY     ENTEROSCOPY N/A 11/09/2021   Procedure: ENTEROSCOPY;  Surgeon: Normie Becton., MD;  Location: [facility] ENDOSCOPY;  Service: Gastroenterology;  Laterality: N/A;   ESOPHAGOGASTRODUODENOSCOPY (EGD) WITH PROPOFOL  N/A 03/02/2021   Procedure: ESOPHAGOGASTRODUODENOSCOPY (EGD) WITH PROPOFOL ;  Surgeon: Elois Hair, MD;  Location: [facility] ENDOSCOPY;  Service: Gastroenterology;  Laterality: N/A;   EYE SURGERY     FINE NEEDLE ASPIRATION  10/16/2023   Procedure: FINE NEEDLE ASPIRATION;  Surgeon: Margaretann Sharper, MD;  Location: [facility] ENDOS[facility];  Service: Pulmonary;;   HEMOSTASIS CLIP PLACEMENT  03/02/2021   Procedure: HEMOSTASIS CLIP PLACEMENT;  Surgeon: Elois Hair, MD;  Location: [facility] ENDOSCOPY;  Service: Gastroenterology;;   HEMOSTASIS CLIP PLACEMENT  11/09/2021   Procedure: HEMOSTASIS CLIP PLACEMENT;  Surgeon: Normie Becton., MD;  Location: [facility] ENDOSCOPY;  Service: Gastroenterology;;   HOT HEMOSTASIS  03/02/2021   Procedure: HOT HEMOSTASIS (ARGON PLASMA COAGULATION/BICAP);  Surgeon: Elois Hair, MD;  Location: [facility] ENDOSCOPY;  Service: Gastroenterology;;   HOT HEMOSTASIS N/A 11/09/2021   Procedure: HOT HEMOSTASIS (ARGON PLASMA COAGULATION/BICAP);  Surgeon: Normie Becton., MD;  Location: [facility] ENDOSCOPY;  Service: Gastroenterology;  Laterality: N/A;   IMPLANTABLE  CARDIOVERTER DEFIBRILLATOR (ICD) GENERATOR CHANGE N/A 04/10/2011   Procedure: ICD GENERATOR CHANGE;  Surgeon: Luana Rumple, MD;  [facility]: [facility] CATH LAB;  Service: Cardiovascular;  Laterality: N/A;   INSERT / REPLACE / REMOVE PACEMAKER     KNEE ARTHROSCOPY     MITRAL VALVE REPLACEMENT  02/26/2006   re-do MVR w/80mm St. [name] (Dr. Cherylynn Cosier)   NM MYOCAR PERF WALL MOTION  2005   persantine myoview - low ris, EF 63%   RIGHT HEART CATH  04/03/2006   pulm cap wedge pressure 24/24, PA pressure 43/22 (mean ), CO 4.8, CI 4.1 (Dr. Jammie Mccune)   RIGHT/LEFT HEART CATH AND CORONARY ANGIOGRAPHY N/A 10/08/2022   Procedure: RIGHT/LEFT HEART CATH AND CORONARY ANGIOGRAPHY;  Surgeon: Millicent Ally, MD;  [facility];  Service: Cardiovascular;  Laterality: N/A;   SCLEROTHERAPY  03/02/2021   Procedure: SCLEROTHERAPY;  Surgeon: Elois Hair, MD;  Location: [facility] ENDOSCOPY;  Service: Gastroenterology;;   SUBMUCOSAL TATTOO INJECTION  11/09/2021   Procedure: SUBMUCOSAL TATTOO INJECTION;  Surgeon: Normie Becton., MD;  Location: [facility] ENDOSCOPY;  Service: Gastroenterology;;   TOTAL SHOULDER ARTHROPLASTY Right 12/27/2015   Procedure: RIGHT TOTAL SHOULDER ARTHROPLASTY;  Surgeon: Sammye Cristal, MD;  Location: [facility] OR;  Service: Orthopedics;  Laterality: Right;  Right total shoulder arthroplasty   TOTAL SHOULDER ARTHROPLASTY Left 10/27/2019   Procedure: REVERSE TOTAL SHOULDER ARTHROPLASTY;  Surgeon: Sammye Cristal, MD;  Location: [facility] ORS;  Service: Orthopedics;  Laterality: Left;   TRANSTHORACIC ECHOCARDIOGRAM  12/2011   EF 50-55%, mild global hypokinesis; LA severely dilated; calcification of anterior/posterior MV leaflets, bi-leaflets St. [name] mechanical MV; mild TR; trace AV regurg/pulm valve regurg   VIDEO BRONCHOSCOPY WITH ENDOBRONCHIAL ULTRASOUND Right 10/16/2023   Procedure: BRONCHOSCOPY, WITH EBUS;  Surgeon: Margaretann Sharper, MD;  Location: [facility] ENDOSCOPY;  Service: Pulmonary;  Laterality: Right;    SOCIAL HISTORY: Social History   Socioeconomic History   Marital status: Married    Spouse name: [name]   Number of children: 4   Years of education: 12   Highest education level: Not on file  Occupational History   Occupation: retired  Tobacco Use   Smoking status: Former    Current packs/day: 0.00    Types: Cigarettes    Quit date: 06/02/2001    Years since quitting: 22.4   Smokeless tobacco: Never  Vaping Use   Vaping status: Never Used  Substance and Sexual Activity   Alcohol use: Yes    Alcohol/week: 0.0  standard drinks of alcohol    Comment: social-1 every 6 months   Drug use: No   Sexual activity: Not on file  Other Topics Concern   Not on file  Social History Narrative   Drinks very little caffeine    Social Drivers of Health   Financial Resource Strain: Low Risk  (10/09/2022)   Overall Financial Resource Strain (CARDIA)    Difficulty of Paying Living Expenses: Not hard at all  Food Insecurity: No Food Insecurity (10/14/2023)   Hunger Vital Sign    Worried About Running Out of Food in the Last Year: Never true    Ran Out of Food in the Last Year: Never true  Transportation Needs: No Transportation Needs (10/14/2023)   PRAPARE - Administrator, Civil Service (Medical): No    Lack of Transportation (Non-Medical): No  Physical Activity: Not on file  Stress: Not on file  Social Connections: Unknown (10/14/2023)   Social Connection and Isolation Panel [NHANES]  Frequency of Communication with Friends and Family: Three times a week    Frequency of Social Gatherings with Friends and Family: Three times a week    Attends Religious Services: More than 4 times per year    Active Member of Clubs or Organizations: Patient declined    Attends Banker Meetings: Patient declined    Marital Status: Married  Catering manager Violence: Not At Risk (10/14/2023)   Humiliation, Afraid, Rape, and Kick questionnaire    Fear of Current or Ex-Partner: No    Emotionally Abused: No    Physically Abused: No    Sexually Abused: No    FAMILY HISTORY: Family History  Problem Relation Age of Onset   Breast cancer Sister        multiple   Uterine cancer Sister        multiple   Heart disease Mother    Heart attack Mother    Heart disease Father    Kidney disease Father    Heart attack Father    Heart disease Sister    Crohn's disease Sister    Heart disease Son    Colon cancer Neg Hx    Stomach cancer Neg Hx      PHYSICAL EXAMINATION: ECOG PERFORMANCE STATUS: 3 -  Symptomatic, >50% confined to bed  Vitals:   10/21/23 0753 10/21/23 1025  BP: (!) 164/89 (!) 165/55  Pulse: 93 83  Resp:  20  Temp: 97.9 F (36.6 C)   SpO2: 100% 100%   Filed Weights   10/14/23 0451 10/20/23 0431  Weight: 92 lb 9.5 oz (42 kg) 97 lb (44 kg)    GENERAL: alert, +mild distress and comfortable  +frail +cachectic SKIN: skin color, texture, turgor are normal, no rashes or significant lesions EYES: normal, conjunctiva are pink and non-injected, sclera clear OROPHARYNX: no exudate, no erythema and lips, buccal mucosa, and tongue normal  NECK: supple, thyroid normal size, non-tender, without nodularity LYMPH: no palpable lymphadenopathy in the cervical, axillary or inguinal LUNGS: +bil wheezing +coarse breath sounds to auscultation  HEART: regular rate & rhythm and no murmurs and no lower extremity edema ABDOMEN: abdomen soft, non-tender and normal bowel sounds MUSCULOSKELETAL: no cyanosis of digits and no clubbing  PSYCH: alert & oriented x 3 with fluent speech NEURO: no focal motor/sensory deficits   ALLERGIES:  is allergic to ace inhibitors, lipitor [atorvastatin calcium ], megace [megestrol], aspirin , clorazepate dipotassium, simvastatin, tiotropium bromide monohydrate, codeine, and tape.  MEDICATIONS:  Current Facility-Administered Medications  Medication Dose Route Frequency Provider Last Rate Last Admin   acetaminophen  (TYLENOL ) tablet 650 mg  650 mg Oral Q6H PRN Swayze, Ava, DO   650 mg at 10/20/23 0500   albuterol  (PROVENTIL ) (2.5 MG/3ML) 0.083% nebulizer solution 3 mL  3 mL Inhalation Q6H PRN Krugh, Marissa C, DO   3 mL at 10/21/23 0436   ALPRAZolam  (XANAX ) tablet 0.25 mg  0.25 mg Oral QHS PRN Krugh, Marissa C, DO   0.25 mg at 10/20/23 1940   antiseptic oral rinse (BIOTENE) solution 15 mL  15 mL Mouth Rinse PRN Krugh, Marissa C, DO       budeson-glycopyrrolate -formoterol  (BREZTRI ) 160-9-4.8 MCG/ACT inhaler 2 puff  2 puff Inhalation BID Krugh, Marissa C, DO   2  puff at 10/21/23 0724   buPROPion  (WELLBUTRIN  SR) 12 hr tablet 150 mg  150 mg Oral q AM Krugh, Marissa C, DO   150 mg at 10/21/23 1032   feeding supplement (ENSURE ENLIVE / ENSURE PLUS) liquid 237  mL  237 mL Oral TID BM Aisha Hove, MD   237 mL at 10/21/23 1445   heparin  ADULT infusion 100 units/mL (25000 units/250mL)  800 Units/hr Intravenous Continuous Hammons, Kimberly B, RPH 8 mL/hr at 10/21/23 1450 800 Units/hr at 10/21/23 1450   icosapent  Ethyl (VASCEPA ) 1 g capsule 1 g  1 g Oral BID Krugh, Marissa C, DO   1 g at 10/21/23 1032   levothyroxine  (SYNTHROID ) tablet 75 mcg  75 mcg Oral Q0600 Krugh, Marissa C, DO   75 mcg at 10/21/23 1191   magnesium  oxide (MAG-OX) tablet 400 mg  400 mg Oral BID Aisha Hove, MD   400 mg at 10/21/23 1033   metoCLOPramide  (REGLAN ) tablet 5 mg  5 mg Oral BID PRN Krugh, Marissa C, DO       metoprolol  succinate (TOPROL -XL) 24 hr tablet 25 mg  25 mg Oral BH-q7a Krugh, Marissa C, DO   25 mg at 10/21/23 4782   And   metoprolol  succinate (TOPROL -XL) 24 hr tablet 50 mg  50 mg Oral QPM Krugh, Marissa C, DO   50 mg at 10/20/23 1708   multivitamin with minerals tablet 1 tablet  1 tablet Oral Daily Aisha Hove, MD   1 tablet at 10/21/23 1033   pantoprazole  (PROTONIX ) EC tablet 40 mg  40 mg Oral QHS Marilynn Shuck, RPH   40 mg at 10/20/23 2135   pravastatin  (PRAVACHOL ) tablet 40 mg  40 mg Oral QHS Krugh, Marissa C, DO   40 mg at 10/20/23 2135   thiamine (VITAMIN B1) tablet 100 mg  100 mg Oral Daily Verlyn Goad, MD       traMADol  (ULTRAM ) tablet 50 mg  50 mg Oral Q8H PRN Krugh, Marissa C, DO   50 mg at 10/20/23 1941   traZODone  (DESYREL ) tablet 25 mg  25 mg Oral QHS PRN Krugh, Marissa C, DO   25 mg at 10/21/23 0431   warfarin (COUMADIN ) tablet 4 mg  4 mg Oral ONCE-1600 Hammons, Shelbie Dess, [facility]       Warfarin - Pharmacist Dosing Inpatient   Does not apply q1600 Krugh, Marissa C, DO   Given at 10/20/23 1708     LABORATORY DATA:  I have  reviewed the data as listed Lab Results  Component Value Date   WBC 15.5 (H) 10/21/2023   HGB 8.7 (L) 10/21/2023   HCT 26.6 (L) 10/21/2023   MCV 93.3 10/21/2023   PLT 229 10/21/2023   Recent Labs    10/14/23 0617 10/15/23 0725 10/19/23 0436 10/20/23 0801 10/21/23 0905  NA 134*   < > 128* 133* 133*  K 4.1   < > 4.0 3.5 4.5  CL 100   < > 98 102 98  CO2 22   < > 18* 25 22  GLUCOSE 97   < > 59* 136* 121*  BUN 16   < > 39* 27* 28*  CREATININE 1.22*   < > 2.92* 1.89* 1.45*  CALCIUM  9.9   < > 8.4* 9.1 10.5*  GFRNONAA 45*   < > 16* 26* 36*  PROT 6.0*  --   --   --   --   ALBUMIN 2.4*  --   --   --   --   AST 30  --   --   --   --   ALT 20  --   --   --   --   ALKPHOS 74  --   --   --   --  BILITOT 0.7  --   --   --   --    < > = values in this interval not displayed.    RADIOGRAPHIC STUDIES: I have personally reviewed the radiological images as listed and agreed with the findings in the report. CT HEAD WO CONTRAST ( ) Result Date: 10/19/2023 CLINICAL DATA:  Head trauma, coagulopathy (Age 39-64y) Head trauma on coumadin  with INR 1.7 EXAM: CT HEAD WITHOUT CONTRAST TECHNIQUE: Contiguous axial images were obtained from the base of the skull through the vertex without intravenous contrast. RADIATION DOSE REDUCTION: This exam was performed according to the departmental dose-optimization program which includes automated exposure control, adjustment of the mA and/or kV according to patient size and/or use of iterative reconstruction technique. COMPARISON:  CT head 05/29/2023. FINDINGS: Brain: No evidence of acute infarction, hemorrhage, hydrocephalus, extra-axial collection or mass lesion/mass effect. Advanced patchy white matter hypodensities, nonspecific but compatible with chronic microvascular ischemic disease. Vascular: No hyperdense vessel. Skull: Left posterior scalp hematoma.  No acute fracture. Sinuses/Orbits: Clear sinuses.  No acute orbital findings. IMPRESSION: 1. No evidence of  acute intracranial abnormality. 2. Left posterior scalp hematoma. 3. Advanced chronic microvascular ischemic disease. Electronically Signed   By: Stevenson Elbe M.D.   On: 10/19/2023 03:15   US  RENAL Result Date: 10/18/2023 CLINICAL DATA:  [id number] Acute renal failure superimposed on stage 3b chronic kidney disease (HCC) [id number] EXAM: RENAL / URINARY TRACT ULTRASOUND COMPLETE COMPARISON:  Oct 03, 2022 FINDINGS: Evaluation is limited secondary to limited acoustic windows and inability to hold breath. Right Kidney: Renal measurements: 8.2 x 3.7 x 4.9 cm = volume: 66 mL. Echogenicity is increased. No hydronephrosis visualized. Benign cyst noted measuring 6 mm (for which no dedicated imaging follow-up is recommended). Left Kidney: Renal measurements: 9.6 x 5.7 x 4.7 cm = volume: 134 mL. Echogenicity is mildly increased. No hydronephrosis visualized. Exophytic cyst measuring up to 11 mm, suboptimally assessed If persistent clinical concern for traumatic injury, recommend dedicated cross-sectional imaging. Bladder: Appears normal for degree of bladder distention. Other: RIGHT pleural effusion. IMPRESSION: 1. No hydronephrosis. 2. Increased renal echogenicity as can be seen in medical renal disease. 3. RIGHT pleural effusion. Electronically Signed   By: Clancy Crimes M.D.   On: 10/18/2023 16:56   DG CHEST PORT 1 VIEW Result Date: 10/18/2023 CLINICAL DATA: Shortness of breath EXAM: PORTABLE CHEST 1 VIEW COMPARISON:  10/16/2023 FINDINGS: The cardio pericardial silhouette is enlarged. Right base collapse/consolidation is similar to prior with small right pleural effusion. Interstitial coarsening at the left base is similar. Status post cardiac valve replacement. Left-sided permanent pacemaker evident with multiple additional pacer leads overlying the chest. Status post left shoulder replacement. Bones are diffusely demineralized. IMPRESSION: Right base collapse/consolidation with small right pleural effusion,  similar to prior. Electronically Signed   By: Donnal Fusi M.D.   On: 10/18/2023 05:32   DG Chest Port 1 View Result Date: 10/16/2023 CLINICAL DATA:  Status post bronchoscopy. EXAM: PORTABLE CHEST 1 VIEW COMPARISON:  Chest radiograph dated 10/13/2023 and CT dated 10/13/2023. FINDINGS: Bilobed of emphysema. Right lower lobe opacity better evaluated on CT. No pneumothorax. Mild cardiomegaly. Median sternotomy wires and left AICD device. No acute osseous pathology. Bilateral shoulder arthroplasties. IMPRESSION: 1. No pneumothorax. 2. Right lower lobe opacity better evaluated on CT. Electronically Signed   By: Angus Bark M.D.   On: 10/16/2023 12:02   ECHOCARDIOGRAM COMPLETE Result Date: 10/14/2023    ECHOCARDIOGRAM REPORT   Patient Name:   Melecio Sports Date of Exam:  10/14/2023 Medical Rec #:  [id number]           Height:       62.0 in Accession #:    [id number]          Weight:       92.6 lb Date of Birth:  Dec 02, 1940            BSA:          1.378 m Patient Age:    83 years            BP:           135/66 mmHg Patient Gender: F                   HR:           90 bpm. Exam Location:  Inpatient Procedure: 2D Echo (Both Spectral and Color Flow Doppler were utilized during            procedure). Indications:    Chest Pain R07.9  History:        Patient has prior history of Echocardiogram examinations, most                 recent 02/20/2023. CHF, COPD, Mitral Valve Disease; Risk                 Factors:Hypertension.                  Mitral Valve: 25 mm St. [name] valve is present in the mitral                 position. Procedure Date: 02/26/2006.  Sonographer:    Astrid Blamer Referring Phys: [id number] MARISSA C KRUGH IMPRESSIONS  1. Left ventricular ejection fraction, by estimation, is 25%. The left ventricle has severely decreased function. The left ventricle demonstrates global hypokinesis. The left ventricular internal cavity size was moderately dilated. Left ventricular diastolic parameters are indeterminate.   2. Right ventricular systolic function is mildly reduced. The right ventricular size is normal. Tricuspid regurgitation signal is inadequate for assessing PA pressure.  3. The mitral valve has been repaired/replaced. Trivial mitral valve regurgitation likely normal washing jet. The mean mitral valve gradient is 4.0 mmHg with average heart rate of 86 bpm. There is a 25 mm St. [name] present in the mitral position. Procedure Date: 02/26/2006. Echo findings are consistent with normal structure and function of the mitral valve prosthesis.  4. The aortic valve is tricuspid. There is mild calcification of the aortic valve. Aortic valve regurgitation is mild. No aortic stenosis is present.  5. The inferior vena cava is dilated in size with <50% respiratory variability, suggesting right atrial pressure of 15 mmHg. FINDINGS  Left Ventricle: Left ventricular ejection fraction, by estimation, is 25%. The left ventricle has severely decreased function. The left ventricle demonstrates global hypokinesis. The left ventricular internal cavity size was moderately dilated. There is  no left ventricular hypertrophy. Left ventricular diastolic parameters are indeterminate. Right Ventricle: The right ventricular size is normal. No increase in right ventricular wall thickness. Right ventricular systolic function is mildly reduced. Tricuspid regurgitation signal is inadequate for assessing PA pressure. Left Atrium: Left atrial size was normal in size. Right Atrium: Right atrial size was normal in size. Pericardium: There is no evidence of pericardial effusion. Mitral Valve: The mitral valve has been repaired/replaced. Trivial mitral valve regurgitation. There is a 25 mm St. [name] present in the mitral position. Procedure Date: 02/26/2006. Echo findings  are consistent with normal structure and function of the mitral valve prosthesis. MV peak gradient, 6.8 mmHg. The mean mitral valve gradient is 4.0 mmHg with average heart rate of 86 bpm.  Tricuspid Valve: The tricuspid valve is normal in structure. Tricuspid valve regurgitation is trivial. No evidence of tricuspid stenosis. Aortic Valve: The aortic valve is tricuspid. There is mild calcification of the aortic valve. Aortic valve regurgitation is mild. Aortic regurgitation PHT measures 562 msec. No aortic stenosis is present. Aortic valve mean gradient measures 4.0 mmHg. Aortic valve peak gradient measures 6.5 mmHg. Aortic valve area, by VTI measures 2.17 cm. Pulmonic Valve: The pulmonic valve was grossly normal. Pulmonic valve regurgitation is trivial. No evidence of pulmonic stenosis. Aorta: The aortic root is normal in size and structure. Venous: The inferior vena cava is dilated in size with less than 50% respiratory variability, suggesting right atrial pressure of 15 mmHg. IAS/Shunts: No atrial level shunt detected by color flow Doppler.  LEFT VENTRICLE PLAX 2D LVIDd:         4.80 cm LVIDs:         4.00 cm LV PW:         0.90 cm LV IVS:        0.90 cm LVOT diam:     1.90 cm LV SV:         51 LV SV Index:   37 LVOT Area:     2.84 cm  RIGHT VENTRICLE TAPSE (M-mode): 1.0 cm LEFT ATRIUM             Index        RIGHT ATRIUM          Index LA Vol (A2C):   34.4 ml 24.96 ml/m  RA Area:     5.91 cm LA Vol (A4C):   38.5 ml 27.94 ml/m  RA Volume:   8.12 ml  5.89 ml/m LA Biplane Vol: 37.0 ml 26.85 ml/m  AORTIC VALVE AV Area (Vmax):    2.37 cm AV Area (Vmean):   1.95 cm AV Area (VTI):     2.17 cm AV Vmax:           127.00 cm/s AV Vmean:          103.000 cm/s AV VTI:            0.234 m AV Peak Grad:      6.5 mmHg AV Mean Grad:      4.0 mmHg LVOT Vmax:         106.00 cm/s LVOT Vmean:        70.700 cm/s LVOT VTI:          0.179 m LVOT/AV VTI ratio: 0.76 AI PHT:            562 msec  AORTA Ao Root diam: 3.10 cm MITRAL VALVE MV Area (PHT): 3.65 cm     SHUNTS MV Area VTI:   1.84 cm     Systemic VTI:  0.18 m MV Peak grad:  6.8 mmHg     Systemic Diam: 1.90 cm MV Mean grad:  4.0 mmHg MV Vmax:       1.31  m/s MV Vmean:      96.5 cm/s MV Decel Time: 208 msec MV E velocity: 126.00 cm/s MV A velocity: 128.00 cm/s MV E/A ratio:  0.98 Grady Lawman MD Electronically signed by Grady Lawman MD Signature Date/Time: 10/14/2023/1:19:06 PM    Final    CT Soft Tissue Neck W Contrast Result Date:  10/13/2023 CLINICAL DATA:  Stridor and chest pain EXAM: CT NECK WITH CONTRAST TECHNIQUE: Multidetector CT imaging of the neck was performed using the standard protocol following the bolus administration of intravenous contrast. RADIATION DOSE REDUCTION: This exam was performed according to the departmental dose-optimization program which includes automated exposure control, adjustment of the mA and/or kV according to patient size and/or use of iterative reconstruction technique. CONTRAST:  75mL OMNIPAQUE  IOHEXOL  350 MG/ML SOLN COMPARISON:  None Available. FINDINGS: Pharynx and larynx: Normal. No mass or swelling. Salivary glands: No inflammation, mass, or stone. Thyroid: Normal Lymph nodes: None enlarged or abnormal density. Vascular: Numerous small caliber collaterals surrounding the spinal column. Major vessels are patent. Calcific aortic atherosclerosis. Limited intracranial: Normal Visualized orbits: Normal Mastoids and visualized paranasal sinuses: Clear Skeleton: Negative Upper chest: Biapical emphysema. Other: Patulous debris containing lower cervical and upper thoracic esophagus. IMPRESSION: 1. Patulous debris containing lower cervical and upper thoracic esophagus. 2. No acute abnormality of the neck. Aortic Atherosclerosis (ICD10-I70.0) and Emphysema (ICD10-J43.9). Electronically Signed   By: Juanetta Nordmann M.D.   On: 10/13/2023 19:45   CT Angio Chest PE W and/or Wo Contrast Result Date: 10/13/2023 CLINICAL DATA:  Chest discomfort, difficulty breathing, stridor, throat tightness EXAM: CT ANGIOGRAPHY CHEST WITH CONTRAST TECHNIQUE: Multidetector CT imaging of the chest was performed using the standard protocol during  bolus administration of intravenous contrast. Multiplanar CT image reconstructions and MIPs were obtained to evaluate the vascular anatomy. RADIATION DOSE REDUCTION: This exam was performed according to the departmental dose-optimization program which includes automated exposure control, adjustment of the mA and/or kV according to patient size and/or use of iterative reconstruction technique. CONTRAST:  75mL OMNIPAQUE  IOHEXOL  350 MG/ML SOLN COMPARISON:  10/13/2023, 06/22/2022 FINDINGS: Cardiovascular: This is a technically adequate evaluation of the pulmonary vasculature. No filling defects or pulmonary emboli. The heart is enlarged, with marked left ventricular dilatation. Multi lead cardiac pacer again identified. No evidence of thoracic aortic aneurysm or dissection. Atherosclerosis throughout the aorta and coronary vasculature. 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 Mediastinum/Nodes: Subcarinal soft tissue mass measures up to 3.2 x 5.6 cm, reference image 63/4. This has mass effect upon the midthoracic esophagus, which is deviated to the left. This results in proximal esophageal dilatation and retained secretions. The trachea appears unremarkable. Thyroid is not well visualized. Lungs/Pleura: There is complete consolidation with volume loss in the right lower lobe. There is opacification of the bronchus intermedius and right lower lobe bronchus. Central obstructing neoplasm cannot be excluded. Upper lobe predominant emphysema. Trace right pleural effusion. No pneumothorax. Upper Abdomen: Bilateral adrenal nodules, measuring 14 mm on the right and 10 mm on the left. No other acute upper abdominal findings. Musculoskeletal: No acute or destructive bony abnormalities. Bilateral shoulder arthroplasties. Reconstructed images demonstrate no additional findings. Review of the  MIP images confirms the above findings. IMPRESSION: 1. Large soft tissue mass in the subcarinal region, with mass effect upon the midthoracic esophagus. Findings compatible with malignancy or adenopathy. 2. Dense consolidation of the right lower lobe with volume loss, as well as opacification of the bronchus intermedius and right lower lobe bronchial tree. Central obstructing neoplasm is suspected. Bronchoscopy may be useful. 3. Trace right pleural effusion. 4. No evidence of pulmonary embolus. 5. Nonspecific bilateral adrenal nodularity, metastatic disease not excluded in light of intrathoracic findings. 6. Cardiomegaly with prominent left ventricular dilatation. 7. Aortic Atherosclerosis (ICD10-I70.0) and Emphysema (  ICD10-J43.9). Electronically Signed   By: Bobbye Burrow M.D.   On: 10/13/2023 18:51   DG Chest 2 View Result Date: 10/13/2023 CLINICAL DATA:  Shortness of breath.  Wheezing. EXAM: CHEST - 2 VIEW COMPARISON:  Chest radiographs 10/03/2022, 06/20/2022, 11/18/2021 FINDINGS: Status post median sternotomy and mitral valve prosthesis, unchanged. Mild high-grade atherosclerotic calcifications within the aortic arch. Left chest wall AICD with leads overlying the right atrium, right antrum ventricle, and coronary sinus. Cardiac silhouette is again at the upper limits of normal size. Mediastinal contours are within normal limits. Mild bilateral interstitial thickening is actually improved from 10/03/2022 and 06/20/2022 and may represent the patient's normal baseline, mild chronic fibrosis. Small to moderate right pleural effusion and associated right basilar heterogeneous airspace opacification appears to be new from prior. No pneumothorax. Status post reverse total left shoulder arthroplasty and standard total right shoulder arthroplasty. IMPRESSION: 1. Small to moderate right pleural effusion and associated right basilar heterogeneous airspace opacification appears to be new from prior. This may represent  atelectasis or pneumonia. 2. Mild bilateral interstitial thickening is actually improved from 10/03/2022 and 06/20/2022 and may represent the patient's normal baseline, mild chronic fibrosis. Electronically Signed   By: Bertina Broccoli M.D.   On: 10/13/2023 16:32     The total time spent in the appointment was 55 minutes encounter with patients including review of chart and various tests results, discussions about plan of care and coordination of care plan   All questions were answered. The patient knows to call the clinic with any problems, questions or concerns. No barriers to learning was detected.  Jacqualin Mate, NP 5/21/20253:31 PM   Attending Note  I personally saw the patient, reviewed the chart and examined the patient. The plan of care was discussed with the patient and the admitting team. I agree with the assessment and plan as documented above. Thank you very much for the consultation.  Squamous cell carcinoma of the lung: Subcarinal mass 3.2 x 5.6 cm causing wheezing and shortness of breath (50+ pack-year tobacco history) causing partial esophageal dilatation and retained secretions, complete consolidation with volume loss in the right lower lobe central obstructing neoplasm cannot be excluded. I requested Dr. Jeryl Moris to see the patient for evaluation for palliative radiation Patient has multiple chronic health issues including CKD, CHF, depression, hallucinations, cachexia Patient's husband was treated by Dr. Marguerita Shih and he would very much like him to take care of her.  Dr. Marguerita Shih will see the patient if she gets transferred to [facility]. Ideally we would like to do complete staging with PET CT scan and brain MRI.  Dr. Aleene Amor will evaluate and decide if he wants any additional scans.

## 2023-10-21 NOTE — Progress Notes (Signed)
 Mobility Specialist: Progress Note   10/21/23 1552  Mobility  Activity Transferred to/from [facility]  Level of Assistance Contact guard assist, steadying assist  Assistive Device Other (Comment) (HHA)  Activity Response Tolerated well  Mobility Referral Yes  Mobility visit 1 Mobility  Mobility Specialist Start Time (ACUTE ONLY) 1142  Mobility Specialist Stop Time (ACUTE ONLY) 1145  Mobility Specialist Time Calculation (min) (ACUTE ONLY) 3 min    Pt requesting to use [facility], received on EOB. CG for stand pivot to [facility] with HHA. Left on [facility] with all needs met, call bell in reach.   Deloria Fetch Mobility Specialist Please contact via SecureChat or Rehab office at [phone]

## 2023-10-22 ENCOUNTER — Telehealth: Payer: Self-pay | Admitting: Cardiovascular Disease

## 2023-10-22 ENCOUNTER — Inpatient Hospital Stay (HOSPITAL_COMMUNITY)

## 2023-10-22 ENCOUNTER — Other Ambulatory Visit: Payer: Self-pay | Admitting: Cardiovascular Disease

## 2023-10-22 ENCOUNTER — Encounter: Payer: Self-pay | Admitting: Cardiovascular Disease

## 2023-10-22 ENCOUNTER — Ambulatory Visit
Admit: 2023-10-22 | Discharge: 2023-10-22 | Disposition: A | Discharge status: Home / Self Care | Attending: Radiation Oncology | Admitting: Radiation Oncology

## 2023-10-22 ENCOUNTER — Telehealth: Payer: Self-pay

## 2023-10-22 ENCOUNTER — Ambulatory Visit: Admitting: Radiation Oncology

## 2023-10-22 DIAGNOSIS — N179 Acute kidney failure, unspecified: Secondary | ICD-10-CM | POA: Diagnosis not present

## 2023-10-22 DIAGNOSIS — J9601 Acute respiratory failure with hypoxia: Secondary | ICD-10-CM

## 2023-10-22 DIAGNOSIS — Z7189 Other specified counseling: Secondary | ICD-10-CM

## 2023-10-22 DIAGNOSIS — C3490 Malignant neoplasm of unspecified part of unspecified bronchus or lung: Secondary | ICD-10-CM

## 2023-10-22 DIAGNOSIS — Z515 Encounter for palliative care: Secondary | ICD-10-CM | POA: Diagnosis not present

## 2023-10-22 DIAGNOSIS — R52 Pain, unspecified: Secondary | ICD-10-CM

## 2023-10-22 DIAGNOSIS — Z66 Do not resuscitate: Secondary | ICD-10-CM

## 2023-10-22 DIAGNOSIS — R1310 Dysphagia, unspecified: Secondary | ICD-10-CM

## 2023-10-22 DIAGNOSIS — J189 Pneumonia, unspecified organism: Secondary | ICD-10-CM

## 2023-10-22 DIAGNOSIS — C3431 Malignant neoplasm of lower lobe, right bronchus or lung: Secondary | ICD-10-CM | POA: Insufficient documentation

## 2023-10-22 DIAGNOSIS — M7989 Other specified soft tissue disorders: Secondary | ICD-10-CM | POA: Diagnosis not present

## 2023-10-22 DIAGNOSIS — Z952 Presence of prosthetic heart valve: Secondary | ICD-10-CM | POA: Diagnosis not present

## 2023-10-22 DIAGNOSIS — I480 Paroxysmal atrial fibrillation: Secondary | ICD-10-CM | POA: Diagnosis not present

## 2023-10-22 DIAGNOSIS — J90 Pleural effusion, not elsewhere classified: Secondary | ICD-10-CM

## 2023-10-22 DIAGNOSIS — R061 Stridor: Secondary | ICD-10-CM | POA: Insufficient documentation

## 2023-10-22 DIAGNOSIS — Z79899 Other long term (current) drug therapy: Secondary | ICD-10-CM

## 2023-10-22 DIAGNOSIS — R7989 Other specified abnormal findings of blood chemistry: Secondary | ICD-10-CM | POA: Diagnosis not present

## 2023-10-22 LAB — BASIC METABOLIC PANEL WITH GFR
Anion gap: 8 (ref 5–15)
BUN: 30 mg/dL — ABNORMAL HIGH (ref 8–23)
CO2: 24 mmol/L (ref 22–32)
Calcium: 10.9 mg/dL — ABNORMAL HIGH (ref 8.9–10.3)
Chloride: 101 mmol/L (ref 98–111)
Creatinine, Ser: 1.36 mg/dL — ABNORMAL HIGH (ref 0.44–1.00)
GFR, Estimated: 39 mL/min — ABNORMAL LOW (ref >60)
Glucose, Bld: 97 mg/dL (ref 70–99)
Potassium: 4.3 mmol/L (ref 3.5–5.1)
Sodium: 133 mmol/L — ABNORMAL LOW (ref 135–145)

## 2023-10-22 LAB — HEPARIN LEVEL (UNFRACTIONATED): Heparin Unfractionated: 0.15 [IU]/mL — ABNORMAL LOW (ref 0.30–0.70)

## 2023-10-22 LAB — PROTIME-INR
INR: 2.3 — ABNORMAL HIGH (ref 0.8–1.2)
Prothrombin Time: 25.2 s — ABNORMAL HIGH (ref 11.4–15.2)

## 2023-10-22 LAB — MRSA NEXT GEN BY PCR, NASAL: MRSA by PCR Next Gen: NOT DETECTED

## 2023-10-22 LAB — CYTOLOGY - NON PAP

## 2023-10-22 MED ORDER — RACEPINEPHRINE HCL 2.25 % IN NEBU
INHALATION_SOLUTION | RESPIRATORY_TRACT | Status: AC
  Administered 2023-10-22: 0.5 mL
  Filled 2023-10-22: qty 0.5

## 2023-10-22 MED ORDER — ORAL CARE MOUTH RINSE: 15.0000 mL | OROMUCOSAL | Status: DC | PRN

## 2023-10-22 MED ORDER — POLYVINYL ALCOHOL 1.4 % OP SOLN: 1.0000 [drp] | Freq: Four times a day (QID) | OPHTHALMIC | Status: DC | PRN

## 2023-10-22 MED ORDER — LORAZEPAM 2 MG/ML IJ SOLN: 0.2500 mg | INTRAMUSCULAR | Status: DC | PRN

## 2023-10-22 MED ORDER — ORAL CARE MOUTH RINSE: 15.0000 mL | OROMUCOSAL | Status: DC

## 2023-10-22 MED ORDER — GLYCOPYRROLATE 0.2 MG/ML IJ SOLN
0.2000 mg | INTRAMUSCULAR | Status: DC | PRN
  Administered 2023-10-22: 0.2 mg via INTRAVENOUS
  Filled 2023-10-22: qty 1

## 2023-10-22 MED ORDER — HYDROMORPHONE HCL-NACL 50-0.9 MG/50ML-% IV SOLN
0.5000 mg/h | INTRAVENOUS | Status: DC
  Administered 2023-10-22: 0.5 mg/h via INTRAVENOUS
  Filled 2023-10-22: qty 50

## 2023-10-22 MED ORDER — CHLORHEXIDINE GLUCONATE CLOTH 2 % EX PADS
6.0000 | MEDICATED_PAD | Freq: Every day | CUTANEOUS | Status: DC
  Administered 2023-10-22: 6 via TOPICAL

## 2023-10-22 MED ORDER — BIOTENE DRY MOUTH MT LIQD: 15.0000 mL | OROMUCOSAL | Status: DC | PRN

## 2023-10-22 MED ORDER — HALOPERIDOL LACTATE 5 MG/ML IJ SOLN: 1.0000 mg | INTRAMUSCULAR | Status: DC | PRN

## 2023-10-22 MED ORDER — ONDANSETRON 4 MG PO TBDP: 4.0000 mg | ORAL_TABLET | Freq: Four times a day (QID) | ORAL | Status: DC | PRN

## 2023-10-22 MED ORDER — ONDANSETRON HCL 4 MG/2ML IJ SOLN: 4.0000 mg | Freq: Four times a day (QID) | INTRAMUSCULAR | Status: DC | PRN

## 2023-10-22 MED ORDER — LORAZEPAM 2 MG/ML IJ SOLN: 1.0000 mg | INTRAMUSCULAR | Status: DC | PRN

## 2023-10-22 MED ORDER — RACEPINEPHRINE HCL 2.25 % IN NEBU
0.5000 mL | INHALATION_SOLUTION | Freq: Once | RESPIRATORY_TRACT | Status: DC
  Filled 2023-10-22 (×2): qty 0.5

## 2023-10-22 MED ORDER — HYDROMORPHONE HCL 1 MG/ML IJ SOLN
0.5000 mg | INTRAMUSCULAR | Status: DC | PRN
  Administered 2023-10-22: 0.5 mg via INTRAVENOUS
  Filled 2023-10-22: qty 1

## 2023-10-22 MED ORDER — WARFARIN SODIUM 2.5 MG PO TABS
2.5000 mg | ORAL_TABLET | Freq: Once | ORAL | Status: DC
  Filled 2023-10-22: qty 1

## 2023-10-22 MED ORDER — HYDROMORPHONE BOLUS VIA INFUSION: 0.5000 mg | INTRAVENOUS | Status: DC | PRN

## 2023-10-22 NOTE — Progress Notes (Signed)
 I accompanied [name]'s husband downstairs when she was transferred to ICU and was present with him as he met with palliative and critical care providers. He called Stormee's family who live out of [place] and he also called his daughter who lives in [place].  He does not want her to suffer or be in any pain.I provided prayer once his daughter had arrived and he is taking comfort in his faith.  We will continue to check in, but please page chaplain on-call if needs arise.

## 2023-10-22 NOTE — Telephone Encounter (Signed)
 Error

## 2023-10-22 NOTE — Consult Note (Signed)
 NAME:  [name], MRN:  [id number], DOB:  1941-05-07, LOS: 9 ADMISSION DATE:  10/13/2023, CONSULTATION DATE:  10/22/23  REFERRING MD:  MD Netty  CHIEF COMPLAINT:  SOB  History of Present Illness:  Pt is a 83 yr old female with a significant past medical hx of paroxysmal afib, mitral valve prolapse s/p MVR, chronic systolic/diastolic heart failure s/p ICD, HLD, HTN, hypothyroidism, nonobstructive CAD, COPD, Depression, Diverticulosis, Fibromyalgia, and GERD who presented to ED on 5/13 with shortness of breath, hemoptysis occurring over the past 6 months, and dysphagia. Upon initial work up in the ED, patient had elevated troponin which were deemed to be related to demand ischemia, leukocytosis, CTA of chest indicating large soft tissue mass in subcarinal region with mass effect on midthoracic esophagus, suspected central obstructing neoplasm in the right lower lobe. Therefore pulmonary was consulted and underwent a bronchoscopy on 10/16/23. Results of bronchoscopy showed endobronchial mass proximally in bronchus intermedius. Cytology confirmed squamous cell carcinoma and oncology was consulted. Plan was to receive XRT for palliative measures at [facility] and was transferred from [facility]. Unfortunately patient developed worsening shortness of breath and stridor on medical floor at [facility] on 5/22. PCCM was asked to come to beside due to possible respiratory interventions including intubation. Patient transferred to ICU for respiratory distress. Palliative team discussing with husband about Goals of Care.   Pertinent  Medical History   Past Medical History:  Diagnosis Date   Allergy    seasonal   Anemia    Anxiety    Arthritis    Asthma    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 Protecta   Biventricular ICD (implantable cardioverter-defibrillator) in place    with CRT   Blood transfusion without reported diagnosis    Bursitis    Cataract    RIGHT EYE   CHF  (congestive heart failure) (HCC)    Chronic kidney disease    Colon polyp    adenomatous   Complication of anesthesia    patient stated that had difficulty getting the breathing tube removed, patient said that she stopped breathing and HR dropped to 10  patient then woke up and started breathing pateint stated no longer than one minute; re-intubated in PACU following cholecystectomy 10/28/13   COPD    Depression    Diverticulosis    Dysrhythmia    Fibromyalgia    GERD (gastroesophageal reflux disease)    Gout    H/O mitral valve replacement 2002, 2007   Heart murmur    Hemorrhoids    Hyperlipidemia    Hypertension    Hypothyroidism    IBS (irritable bowel syndrome)    MVP (mitral valve prolapse)    NAUSEA AND VOMITING 08/01/2009   Neuromuscular disorder (HCC)    fibromyalgia   Pacemaker    Peptic ulcer disease      Significant Hospital Events: Including procedures, antibiotic start and stop dates in addition to other pertinent events   5/22 Transferred from [facility] to ICU due to worsening Respiratory status, possible intubation for radiation treatment, however patient transitioned to comfort care shortly after arrival to ICU   Interim History / Subjective:  Respiratory Distress, Now on NRB, Occasional Stridor  Palliative care team discussing GOC with patient   Objective    Blood pressure (!) 169/72, pulse 78, temperature (!) 97.4 F (36.3 C), temperature source Oral, resp. rate 12, height 5\' 2"  (1.575 m), weight 41.1 kg, SpO2 (!) 89%.    FiO2 (%):  [  36 %] 36 %   Intake/Output Summary (Last 24 hours) at 10/22/2023 1529 Last data filed at 10/22/2023 0158 Gross per 24 hour  Intake 300 ml  Output --  Net 300 ml   Filed Weights   10/14/23 0451 10/20/23 0431 10/22/23 0455  Weight: 42 kg 44 kg 41.1 kg    Examination: General: cachetic, ill appearing elderly woman, in acute respiratory distress, lying in bed  HENT: Normocephalic, PERRLA intact-pupils sluggish, Pink MM,  missing teeth  Lungs: rhonchus, diminished Cardiovascular: s1,s2, ICD, no JVD, no MRG  Abdomen: bs, soft, active  Extremities: no edema, moves extremities  Neuro: lethargic, following intermittent commands, orientation questionable  GU: deferred   Resolved problem list   Assessment and Plan  Acute Respiratory Distress/Stridor with hypoxia  Postobstructive Pneumonia  Metastatic Squamous cell lung cancer  Dysphagia  Severe Malnutrition  AKI on CKD  Paroxysmal afib Mitral valve prolapse s/p MVR CAD CHF  CCM notified to beside due to patient's worsening respiratory status. Upon arrival to room, patient was in respiratory distress with notable stridor. Plan was for patient to receive XRT- as a palliative measure (total of 10 treatments). Unfortunately patient's clinical status change and requiring patient to be transferred to ICU for possible intubation. However, there is documentation and per patient's advance directives that patient did not want to be on artificial life support measures- opted to be DNR/DNI. Per this documentation, patient did not want health care POA to override this decision.  Palliative MD, Mims discussed with husband further and husband agreed to follow patient's wishes.  Also discussed with patient along with palliative care team that recommend patient to be made comfortable and focus on quality of life instead of quantify. Patient's husband in agreement and wanted to proceed with comfort care.   Will keep patient in ICU/SD for now, until patient is comfortable on comfort care interventions.   Best Practice (right click and "Reselect all SmartList Selections" daily)   Per primary   Labs   CBC: Recent Labs  Lab 10/17/23 0642 10/18/23 0901 10/19/23 0436 10/20/23 0801 10/21/23 0601  WBC 7.1 20.9* 14.1* 13.1* 15.5*  HGB 7.9* 8.8* 8.4* 8.4* 8.7*  HCT 23.5* 26.6* 25.2* 25.8* 26.6*  MCV 90.4 90.5 91.3 90.8 93.3  PLT 249 364 262 259 229    Basic Metabolic  Panel: Recent Labs  Lab 10/18/23 0901 10/19/23 0436 10/20/23 0801 10/21/23 0904 10/21/23 0905 10/22/23 0554  NA 124* 128* 133*  --  133* 133*  K 4.5 4.0 3.5  --  4.5 4.3  CL 91* 98 102  --  98 101  CO2 18* 18* 25  --  22 24  GLUCOSE 109* 59* 136*  --  121* 97  BUN 42* 39* 27*  --  28* 30*  CREATININE 3.81* 2.92* 1.89*  --  1.45* 1.36*  CALCIUM  8.5* 8.4* 9.1  --  10.5* 10.9*  MG 1.5*  --   --  2.3  --   --   PHOS 6.6*  --   --   --   --   --    GFR: Estimated Creatinine Clearance: 21 mL/min (A) (by C-G formula based on SCr of 1.36 mg/dL (H)). Recent Labs  Lab 10/18/23 0901 10/19/23 0436 10/20/23 0801 10/21/23 0601  PROCALCITON 0.84  --   --   --   WBC 20.9* 14.1* 13.1* 15.5*    Liver Function Tests: No results for input(s): "AST", "ALT", "ALKPHOS", "BILITOT", "PROT", "ALBUMIN" in the last 168 hours.  No results for input(s): "LIPASE", "AMYLASE" in the last 168 hours. No results for input(s): "AMMONIA" in the last 168 hours.  ABG    Component Value Date/Time   PHART 7.416 10/08/2022 1128   PCO2ART 42.5 10/08/2022 1128   PO2ART 70 (L) 10/08/2022 1128   HCO3 22.0 10/18/2023 0900   TCO2 29 10/08/2022 1128   ACIDBASEDEF 5.1 (H) 10/18/2023 0900   O2SAT 36.5 10/18/2023 0900     Coagulation Profile: Recent Labs  Lab 10/18/23 0901 10/19/23 0436 10/20/23 0801 10/21/23 0601 10/22/23 0554  INR 1.7* 1.9* 1.9* 2.0* 2.3*    Cardiac Enzymes: No results for input(s): "CKTOTAL", "CKMB", "CKMBINDEX", "TROPONINI" in the last 168 hours.  HbA1C: No results found for: "HGBA1C"  CBG: No results for input(s): "GLUCAP" in the last 168 hours.  Review of Systems:   See HPI   Past Medical History:  She,  has a past medical history of Allergy, Anemia, Anxiety, Arthritis, Asthma, Automatic implantable cardioverter-defibrillator in situ, Biventricular ICD (implantable cardioverter-defibrillator) in place, Blood transfusion without reported diagnosis, Bursitis, Cataract, CHF  (congestive heart failure) (HCC), Chronic kidney disease, Colon polyp, Complication of anesthesia, COPD, Depression, Diverticulosis, Dysrhythmia, Fibromyalgia, GERD (gastroesophageal reflux disease), Gout, H/O mitral valve replacement (2002, 2007), Heart murmur, Hemorrhoids, Hyperlipidemia, Hypertension, Hypothyroidism, IBS (irritable bowel syndrome), MVP (mitral valve prolapse), NAUSEA AND VOMITING (08/01/2009), Neuromuscular disorder (HCC), Pacemaker, and Peptic ulcer disease.   Surgical History:   Past Surgical History:  Procedure Laterality Date   ABDOMINAL HYSTERECTOMY     BIV ICD GENERATOR CHANGEOUT N/A 04/29/2017   Procedure: BIV ICD GENERATOR CHANGEOUT;  Surgeon: Luana Rumple, MD;  Location: [facility];  Service: Cardiovascular;  Laterality: N/A;   BRONCHIAL BIOPSY  10/16/2023   Procedure: BRONCHOSCOPY, WITH BIOPSY;  Surgeon: Margaretann Sharper, MD;  Location: [facility] ENDOSCOPY;  Service: Pulmonary;;   BRONCHIAL WASHINGS  10/16/2023   Procedure: IRRIGATION, BRONCHUS;  Surgeon: Margaretann Sharper, MD;  Location: [facility] ENDOSCOPY;  Service: Pulmonary;;   CARDIAC CATHETERIZATION  02/25/2006   normal left main, normal LAD, normal L Cfx, normal/dominant RCA (Dr. Aleda Ammon)   CARDIAC DEFIBRILLATOR PLACEMENT  2007, 11/202012   x2 (pacemaker) (Dr. Melven Stable. Croitoru)   CARDIAC VALVE REPLACEMENT  2002   MV repair - Dr. Isla Mari   CHOLECYSTECTOMY N/A 10/28/2013   Procedure: LAPAROSCOPIC CHOLECYSTECTOMY WITH INTRAOPERATIVE CHOLANGIOGRAM;  Surgeon: Quitman Bucy, MD;  Location: [facility] OR;  Service: General;  Laterality: N/A;   COLONOSCOPY     ENTEROSCOPY N/A 11/09/2021   Procedure: ENTEROSCOPY;  Surgeon: Normie Becton., MD;  Location: [facility] ENDOSCOPY;  Service: Gastroenterology;  Laterality: N/A;   ESOPHAGOGASTRODUODENOSCOPY (EGD) WITH PROPOFOL  N/A 03/02/2021   Procedure: ESOPHAGOGASTRODUODENOSCOPY (EGD) WITH PROPOFOL ;  Surgeon: Elois Hair, MD;  Location: [facility] ENDOSCOPY;  Service:  Gastroenterology;  Laterality: N/A;   EYE SURGERY     FINE NEEDLE ASPIRATION  10/16/2023   Procedure: FINE NEEDLE ASPIRATION;  Surgeon: Margaretann Sharper, MD;  Location: [facility]OS[facility];  Service: Pulmonary;;   HEMOSTASIS CLIP PLACEMENT  03/02/2021   Procedure: HEMOSTASIS CLIP PLACEMENT;  Surgeon: Elois Hair, MD;  Location: [facility] ENDOSCOPY;  Service: Gastroenterology;;   HEMOSTASIS CLIP PLACEMENT  11/09/2021   Procedure: HEMOSTASIS CLIP PLACEMENT;  Surgeon: Normie Becton., MD;  Location: [facility] ENDOSCOPY;  Service: Gastroenterology;;   HOT HEMOSTASIS  03/02/2021   Procedure: HOT HEMOSTASIS (ARGON PLASMA COAGULATION/BICAP);  Surgeon: Elois Hair, MD;  Location: [facility] ENDOSCOPY;  Service: Gastroenterology;;   HOT HEMOSTASIS N/A 11/09/2021   Procedure:  HOT HEMOSTASIS (ARGON PLASMA COAGULATION/BICAP);  Surgeon: Normie Becton., MD;  Location: [facility] ENDOSCOPY;  Service: Gastroenterology;  Laterality: N/A;   IMPLANTABLE CARDIOVERTER DEFIBRILLATOR (ICD) GENERATOR CHANGE N/A 04/10/2011   Procedure: ICD GENERATOR CHANGE;  Surgeon: Luana Rumple, MD;  [facility]: [facility] CATH LAB;  Service: Cardiovascular;  Laterality: N/A;   INSERT / REPLACE / REMOVE PACEMAKER     KNEE ARTHROSCOPY     MITRAL VALVE REPLACEMENT  02/26/2006   re-do MVR w/52mm St. [name] (Dr. Cherylynn Cosier)   NM MYOCAR PERF WALL MOTION  2005   persantine myoview - low ris, EF 63%   RIGHT HEART CATH  04/03/2006   pulm cap wedge pressure 24/24, PA pressure 43/22 (mean ), CO 4.8, CI 4.1 (Dr. Jammie Mccune)   RIGHT/LEFT HEART CATH AND CORONARY ANGIOGRAPHY N/A 10/08/2022   Procedure: RIGHT/LEFT HEART CATH AND CORONARY ANGIOGRAPHY;  Surgeon: Millicent Ally, MD;  [facility];  Service: Cardiovascular;  Laterality: N/A;   SCLEROTHERAPY  03/02/2021   Procedure: SCLEROTHERAPY;  Surgeon: Elois Hair, MD;  Location: [facility] ENDOSCOPY;  Service: Gastroenterology;;   SUBMUCOSAL TATTOO INJECTION  11/09/2021   Procedure: SUBMUCOSAL  TATTOO INJECTION;  Surgeon: Normie Becton., MD;  Location: [facility] ENDOSCOPY;  Service: Gastroenterology;;   TOTAL SHOULDER ARTHROPLASTY Right 12/27/2015   Procedure: RIGHT TOTAL SHOULDER ARTHROPLASTY;  Surgeon: Sammye Cristal, MD;  Location: [facility] OR;  Service: Orthopedics;  Laterality: Right;  Right total shoulder arthroplasty   TOTAL SHOULDER ARTHROPLASTY Left 10/27/2019   Procedure: REVERSE TOTAL SHOULDER ARTHROPLASTY;  Surgeon: Sammye Cristal, MD;  Location: [facility] ORS;  Service: Orthopedics;  Laterality: Left;   TRANSTHORACIC ECHOCARDIOGRAM  12/2011   EF 50-55%, mild global hypokinesis; LA severely dilated; calcification of anterior/posterior MV leaflets, bi-leaflets St. [name] mechanical MV; mild TR; trace AV regurg/pulm valve regurg   VIDEO BRONCHOSCOPY WITH ENDOBRONCHIAL ULTRASOUND Right 10/16/2023   Procedure: BRONCHOSCOPY, WITH EBUS;  Surgeon: Margaretann Sharper, MD;  Location: [facility] ENDOSCOPY;  Service: Pulmonary;  Laterality: Right;     Social History:   reports that she quit smoking about 22 years ago. Her smoking use included cigarettes. She has never used smokeless tobacco. She reports current alcohol use. She reports that she does not use drugs.   Family History:  Her family history includes Breast cancer in her sister; Crohn's disease in her sister; Heart attack in her father and mother; Heart disease in her father, mother, sister, and son; Kidney disease in her father; Uterine cancer in her sister. There is no history of Colon cancer or Stomach cancer.   Allergies Allergies  Allergen Reactions   Ace Inhibitors Cough   Lipitor [Atorvastatin Calcium ] Other (See Comments)    Knee pain    Megace [Megestrol] Other (See Comments)    High blood pressure and blurred vision   Aspirin  Other (See Comments)    Nephrologist said to not take this Update 10/07/22 - patient clarifies this recommendation was lumped in with a whole bunch of other medications at that time many years ago when  nephrologist told her to stay away from Tylenol , vitamins, ASA. Denies prior allergy or allergic reaction to this   Clorazepate Dipotassium Other (See Comments)    Interacts with a drug being taken   Simvastatin Other (See Comments)    Muscle pain   Tiotropium Bromide Monohydrate Other (See Comments)    Dry mouth   Codeine Nausea And Vomiting   Tape Rash     Home Medications  Prior to Admission medications  Medication Sig Start Date End Date Taking? Authorizing Provider  acetaminophen  (TYLENOL ) 650 MG CR tablet Take 650 mg by mouth every 8 (eight) hours as needed for pain.   Yes [provider]  allopurinol  (ZYLOPRIM ) 300 MG tablet Take 300 mg by mouth every evening.    Yes [provider]  ALPRAZolam  (XANAX ) 0.25 MG tablet Take 0.25 mg by mouth at bedtime as needed for anxiety or sleep.    Yes [provider]  antiseptic oral rinse (BIOTENE) LIQD 15 mLs by Mouth Rinse route as needed for dry mouth.   Yes [provider]  buPROPion  (WELLBUTRIN  SR) 150 MG 12 hr tablet Take 150 mg by mouth in the morning.   Yes [provider]  esomeprazole  (NEXIUM ) 40 MG capsule TAKE 1 CAPSULE BY MOUTH EVERY DAY BEFORE BREAKFAST 02/09/23  Yes Esterwood, Amy S, PA-C  furosemide  (LASIX ) 20 MG tablet Take 20 mg by mouth 2 (two) times daily. 09/18/23  Yes [provider]  icosapent  Ethyl (VASCEPA ) 1 g capsule Take 1 capsule (1 g total) by mouth 2 (two) times daily. 09/03/22  Yes Hilty, Aviva Lemmings, MD  ipratropium (ATROVENT ) 0.06 % nasal spray Place 2 sprays into both nostrils daily as needed for rhinitis. 07/07/22  Yes [provider]  levothyroxine  (SYNTHROID ) 75 MCG tablet Take 75 mcg by mouth daily before breakfast.   Yes [provider]  metoCLOPramide  (REGLAN ) 5 MG tablet Take 5 mg by mouth 2 (two) times daily as needed for nausea or vomiting. 11/05/21  Yes [provider]  metoprolol  succinate (TOPROL -XL) 50 MG 24 hr tablet Take 0.5  tablets (25 mg total) by mouth every morning AND 1 tablet (50 mg total) every evening. Take with or immediately following a meal.. 11/17/22  Yes Croitoru, Mihai, MD  Multiple Vitamin (MULTIVITAMIN WITH MINERALS) TABS tablet Take 1 tablet by mouth daily.   Yes [provider]  pravastatin  (PRAVACHOL ) 40 MG tablet Take 40 mg at bedtime by mouth.    Yes [provider]  PROAIR  HFA 108 (90 BASE) MCG/ACT inhaler Inhale 1-2 puffs into the lungs every 6 (six) hours as needed for wheezing. 01/23/15  Yes [provider]  traMADol  (ULTRAM ) 50 MG tablet Take 1 tablet (50 mg total) by mouth every 8 (eight) hours as needed for moderate pain or severe pain (1 tablet every 8 hours as needed for pain). 02/10/23  Yes Croitoru, Mihai, MD  traZODone  (DESYREL ) 50 MG tablet Take 25 mg by mouth at bedtime as needed. 02/04/23  Yes [provider]  TRELEGY ELLIPTA 100-62.5-25 MCG/ACT AEPB Inhale 1 puff into the lungs daily. 10/02/23  Yes [provider]  valsartan  (DIOVAN ) 160 MG tablet Take 1 tablet (160 mg total) by mouth daily. 02/10/23  Yes Croitoru, Mihai, MD  warfarin (COUMADIN ) 2.5 MG tablet TAKE 1 TO 2 TABLETS BY MOUTH DAILY AS DIRECTED BY THE COUMADIN  CLINIC Patient taking differently: Take 1.25-2.5 mg by mouth See admin instructions. Take 1.25mg  by mouth every Friday and Sunday. Take 2.5mg  by mouth all other days of the week. 02/03/23  Yes Avanell Leigh, MD  ferrous sulfate  325 (65 FE) MG tablet Take 1 tablet (325 mg total) by mouth daily with breakfast. Patient not taking: Reported on 10/13/2023 10/15/22   Deforest Fast, MD  furosemide  (LASIX ) 40 MG tablet Take 1 tablet (40 mg total) by mouth daily. Patient not taking: Reported on 10/13/2023 11/17/22   Croitoru, Mihai, MD  Sennosides (SENNA) 15 MG TABS Take 30  mg by mouth at bedtime. Patient not taking: Reported on 10/13/2023    [provider]  spironolactone  (ALDACTONE ) 25 MG tablet Take 1 tablet (25 mg total) by  mouth daily. Please call to schedule a follow up [phone] 10/20/23   Darlis Eisenmenger, MD     Critical care time: 35 mins     [facility]   [facility] 10/22/2023, 4:41 PM  Please see [email or web address] for pager details.  From 7A-7P if no response, please call [phone]. After hours, please call ELink [phone].

## 2023-10-22 NOTE — Progress Notes (Addendum)
 PROGRESS NOTE    [name]  ZOX:[id number] DOB: [birth year] DOA: 10/13/2023 PCP: Bertha Broad, MD   Brief Narrative: [name] is a 83 y.o. 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  Patient presented secondary to shortness of breath with dysphagia and found to have evidence of presumed secondary to postobstructive disease in setting of intrathoracic mass concerning for cancer.  Pulmonology consulted for biopsy which was performed on 9/16 with path significant squamous cell carcinoma.  During hospitalization, patient with elevated troponin concerning for ACS; cardiology consulted with recommendation for no as troponin rises secondary to demand ischemia.  Medical oncology consulted for newly diagnosed squamous cell lung cancer. Patient transferred to [facility] to start radiation treatment.   Assessment and Plan:  Acute respiratory failure with hypoxia In setting of pneumonia and newly diagnosed cancer. Patient managed on supplemental oxygen. Possible patient will not wean off prior to discharge.  Postobstructive pneumonia Presumed secondary to neoplasm. Patient started on Vancomycin /Zosyn , transitioned to Zosyn  IV monotherapy with improvement of symptoms overall.  Squamous cell lung cancer Pulmonology consulted and performed bronchoscopy on 5/16 with biopsies. Pathology revealed squamous cell carcinoma. Palliative care consulted. Patient code status changed from Full Code to DNR-limited. Patient desires to pursue treatment options. Medical oncology consulted and recommended radiation oncology consultation for radiation treatment.  Dysphagia Likely related to thoracic mass. Speech therapy consulted. -SLP recommendations (5/17): Liquid Administration via: Cup;Straw Medication  Administration: Whole meds with liquid Supervision: Patient able to self feed Compensations: Slow rate;Small sips/bites;Follow solids with liquid Postural Changes: Seated upright at 90 degrees;Remain upright for at least 30 minutes after po intake   Severe malnutrition Possibly related to underlying cancer. Patient also with dysphagia. Dietitian consulted. -Dietitian recommendations (5/21): Multivitamin w/ minerals daily 100 mg Thiamine daily  Ensure Enlive po BID, each supplement provides 350 kcal and 20 grams of protein. Magic cup TID with meals, each supplement provides 290 kcal and 9 grams of protein  AKI on CKD stage IIIb Creatinine of 1 on admission with worsening during admission and peak of 3.81. Renal ultrasound significant for no hydronephrosis. Secondary to contrast nephropathy and ARB use. Nephrology consulted. Creatinine improving. Nephrology recommendations to not resume RAAS inhibitor at this time.   Demand ischemia Patient with troponin elevated and peak of 1,614. Cardiology consulted; per their recommendations, considering past history of CAD, presentation is consistent with demand ischemia.  Hyponatremia Mild. Worsened to 124 with AKI, now improved and stable.  Hypomagnesemia Patient on magnesium  oxide. Resolved. -Discontinue magnesium  oxide  Paroxysmal atrial fibrillation Patient is managed on Coumadin  as an outpatient. With history of mechanical valve, INR range is 2.5-3.5. Heparin  drip started for bridge. -Continue heparin  IV for bridge and Coumadin   History of mitral valve prolapse s/p mechanica valve replacement Patient managed on Coumadin  as an outpatient. Currently on a heparin  bridge. -Continue Coumadin  and Heparin  IV  CAD Noted. Patient is on Toprol  XL, Coumadin  and valsartan  as an outpatient  Chronic HFrEF Patient is managed on Lasix , spironolactone , valsartan , Toprol  XL as an outpatient. Lasix , spironolactone  and ARB held secondary to AKI. Repeat  LVEF of 25% this admission.   DVT prophylaxis: Coumadin  and Heparin  IV (bridge) Code Status:   Code Status: Limited: Do not attempt resuscitation (DNR) -DNR-LIMITED -Do Not Intubate/DNI  Family Communication: None at bedside Disposition Plan: Discharge pending medical oncology recommendations and therapy  recommendations   Consultants: Cardiology Critical care medicine Nephrology Palliative care medicine Medical oncology  Procedures:  Bronchoscopy  Antimicrobials: Vancomycin  Zosyn     Subjective: No concerns today. No dyspnea or chest pain.  Objective: BP (!) 166/83 (BP Location: Left Arm)   Pulse 78   Temp (!) 97.4 F (36.3 C) (Oral)   Resp 16   Ht 5\' 2"  (1.575 m)   Wt 41.1 kg   SpO2 97%   BMI 16.57 kg/m   Examination:  General exam: Appears calm and comfortable Respiratory system: Bilateral rhonchi with audible wheeze without auscultation. Respiratory effort normal. No retractions. Cardiovascular system: S1 & S2 heard, RRR. No murmurs. Gastrointestinal system: Abdomen is nondistended, soft and nontender. Normal bowel sounds heard. Central nervous system: Alert and oriented to self and place. Musculoskeletal: No edema. No calf tenderness Psychiatry: Judgement and insight appear normal. Mood & affect appropriate.    Data Reviewed: I have personally reviewed following labs and imaging studies  CBC Lab Results  Component Value Date   WBC 15.5 (H) 10/21/2023   RBC 2.85 (L) 10/21/2023   HGB 8.7 (L) 10/21/2023   HCT 26.6 (L) 10/21/2023   MCV 93.3 10/21/2023   MCH 30.5 10/21/2023   PLT 229 10/21/2023   MCHC 32.7 10/21/2023   RDW 14.6 10/21/2023   LYMPHSABS 0.7 10/09/2022   MONOABS 0.6 10/09/2022   EOSABS 0.3 10/09/2022   BASOSABS 0.0 10/09/2022     Last metabolic panel Lab Results  Component Value Date   NA 133 (L) 10/22/2023   K 4.3 10/22/2023   CL 101 10/22/2023   CO2 24 10/22/2023   BUN 30 (H) 10/22/2023   CREATININE 1.36 (H) 10/22/2023    GLUCOSE 97 10/22/2023   GFRNONAA 39 (L) 10/22/2023   GFRAA 38 (L) 10/28/2019   CALCIUM  10.9 (H) 10/22/2023   PHOS 6.6 (H) 10/18/2023   PROT 6.0 (L) 10/14/2023   ALBUMIN 2.4 (L) 10/14/2023   BILITOT 0.7 10/14/2023   ALKPHOS 74 10/14/2023   AST 30 10/14/2023   ALT 20 10/14/2023   ANIONGAP 8 10/22/2023    GFR: Estimated Creatinine Clearance: 21 mL/min (A) (by C-G formula based on SCr of 1.36 mg/dL (H)).  Recent Results (from the past 240 hours)  Blood culture (routine x 2)     Status: None   Collection Time: 10/13/23  5:34 PM   Specimen: BLOOD LEFT ARM  Result Value Ref Range Status   Specimen Description BLOOD LEFT ARM  Final   Special Requests   Final    BOTTLES DRAWN AEROBIC AND ANAEROBIC Blood Culture adequate volume   Culture   Final    NO GROWTH 5 DAYS Performed at [facility], [address] [place], [place] [address]    Report Status 10/18/2023 FINAL  Final  Blood culture (routine x 2)     Status: None   Collection Time: 10/13/23  8:48 PM   Specimen: BLOOD RIGHT FOREARM  Result Value Ref Range Status   Specimen Description BLOOD RIGHT FOREARM  Final   Special Requests   Final    BOTTLES DRAWN AEROBIC AND ANAEROBIC Blood Culture results may not be optimal due to an inadequate volume of blood received in culture bottles   Culture   Final    NO GROWTH 5 DAYS Performed at [facility], [address] [place], [place] [address]    Report Status 10/18/2023 FINAL  Final  MRSA Next Gen by PCR, Nasal     Status:  None   Collection Time: 10/14/23  2:06 AM   Specimen: Nasal Mucosa; Nasal Swab  Result Value Ref Range Status   MRSA by PCR Next Gen NOT DETECTED NOT DETECTED Final    Comment: (NOTE) The GeneXpert MRSA Assay (FDA approved for NASAL specimens only), is one component of a comprehensive MRSA colonization surveillance program. It is not intended to diagnose MRSA infection nor to guide or monitor treatment for MRSA infections. Test performance is not  FDA approved in patients less than 9 years old. Performed at [facility], [address] [place], [place] [address]   Culture, BAL-quantitative w Gram Stain     Status: None   Collection Time: 10/16/23 10:21 AM   Specimen: Bronchial Alveolar Lavage; Respiratory  Result Value Ref Range Status   Specimen Description BRONCHIAL ALVEOLAR LAVAGE  Final   Special Requests BAL bronchus intermedius  Final   Gram Stain   Final    RARE WBC PRESENT, PREDOMINANTLY PMN NO ORGANISMS SEEN    Culture   Final    NO GROWTH 2 DAYS Performed at [facility], [address], [place], [place] [address]    Report Status 10/18/2023 FINAL  Final      Radiology Studies: No results found.    LOS: 9 days    Aneita Keens, MD [facility] 10/22/2023, 8:39 AM   If 7PM-7AM, please contact night-coverage [email or web address]

## 2023-10-22 NOTE — Telephone Encounter (Signed)
 Patient needs to be set up at the end of June with one of our APPS to follow up from last radiation treatment to ensure device is working well.   Forwarding to EP scheduling team for appt set up.

## 2023-10-22 NOTE — Progress Notes (Signed)
 TO BE COMPLETED BY RADIATION ONCOLOGIST OFFICE:   Patient Name: [name]   Date of Birth: [birth year]   Radiation Oncologist: Dr. Jeryl Moris  Site to be Treated:: Chest   Will x-rays >10 MV be used? No  Will the radiation be >10 cm from the device? yes  Planned Treatment Start Date: 10/22/23  TO BE COMPLETED BY CARDIOLOGIST OFFICE:   Device Information:  Pacemaker []      ICD [x]    Brand: Medtronic: [id number] Model #: [id number]  Serial Number: [id number]     Date of Placement: 04/29/2017  Site of Placement: LEFT  Remote Device Check--Frequency: Every 3 months Remote/ 6 months to 1 year in office   Last Check: 08/18/23 remote/ 02/06/2023 in office.   Is the Patient Pacer Dependent?:  Yes []   No [x]   Does cardiologist request Radiation Oncology to schedule device testing by vendor for the following:  Prior to the Initiation of Treatments?  Yes []  No [x]  During Treatments?  Yes []  No [x]  Post Radiation Treatments?  Yes []  No [x]   Is device monitoring necessary by vendor/cardiologist team during treatments?  Yes []   No [x]   Is cardiac monitoring by Radiation Oncology nursing necessary during treatments? Yes []   No [x]   Do you recommend device be relocated prior to Radiation Treatment? Yes []   No [x]   **PLEASE LIST ANY NOTES OR SPECIAL REQUESTS: Appointment to be set up with EP APP for device follow up here at [facility] 4 weeks post last radiation treatment.       CARDIOLOGIST SIGNATURE:  Dr. Luana Rumple Per Device Clinic Standing Orders, Candace Cerise  10/22/2023 2:05 PM  **Please route completed form back to Radiation Oncology Nursing and "P [facility] RAD ONC ADMIN", OR send an update if there will be a delay in having form completed by expected start date.  **Call [phone] if you have any questions or do not get an in-basket response from a Radiation Oncology staff member

## 2023-10-22 NOTE — Progress Notes (Addendum)
   Called to bedside for respiratory distress. Pt having stridor. Confirmed with husband no CPR in event of cardiac arrest. But he would want pt intubated for respiratory failure.  Called PCCM to bedside.  Will transfer to ICU. Give racemic epi. Low threshold for intubation. Discussed with Dr. Felipe Horton.  Change code status to DNR- with pre-arrest interventions desired.  Unk Garb, DO [facility]

## 2023-10-22 NOTE — Evaluation (Signed)
 Occupational Therapy Evaluation Patient Details Name: [name] MRN: [id number] DOB: Nov 27, 1940 Today's Date: 10/22/2023   History of Present Illness   83 y.o. female admitted 10/13/23 with hemoptysis and cough.  Pt with soft tissue mass 3.2 x 5.6 with mass effect on the esophagus which is deviated to the left, Squamous cell carcinoma, right lung with adrenal mets. 5/16 bronchoscopy. 5/19 fall with head CT, posterior scalp hematoma. PMhx: CAD, CHF, PAF on anticoagulation, ICD, mitral valve prolapse s/p MVR, HLD, lt reverse TSA, Rt TSA, CKD, neuromuscular disorder, gout, COPD, anxiety     Clinical Impressions Pt admitted for above, PTA pt reports being ind with ADLs and ambulating with rollator in home. Pt currently demonstrating impaired safety awareness, limited activity tolerance and presents as generally weak. Pt only able to ambulate to sink and back before fatiguing CGA + RW for short distance mobility but cues for RW use, doing better with seated ADLs. Pt needing mod A to CGA or ADLs, OT to continue following pt acutely to address listed deficits and help transition to next level of care. Patient would benefit from post acute Home OT services to help maximize functional independence in natural environment with direct supervision for mobility and ADLs from family.      If plan is discharge home, recommend the following:   A little help with walking and/or transfers;A little help with bathing/dressing/bathroom;Assistance with cooking/housework;Supervision due to cognitive status;Direct supervision/assist for financial management;Direct supervision/assist for medications management     Functional Status Assessment   Patient has had a recent decline in their functional status and/or demonstrates limited ability to make significant improvements in function in a reasonable and predictable amount of time     Equipment Recommendations   None recommended by OT      Recommendations for Other Services         Precautions/Restrictions   Precautions Precautions: Fall;Other (comment) Recall of Precautions/Restrictions: Impaired Precaution/Restrictions Comments: watch sats Restrictions Weight Bearing Restrictions Per Provider Order: No     Mobility Bed Mobility Overal bed mobility: Needs Assistance Bed Mobility: Supine to Sit, Sit to Supine     Supine to sit: Contact guard Sit to supine: Min assist   General bed mobility comments: min A to assist BLEs with return to supine.    Transfers Overall transfer level: Needs assistance Equipment used: Rolling walker (2 wheels) Transfers: Sit to/from Stand Sit to Stand: Min assist           General transfer comment: light min A to rise into standing. Pt scooting to [facility] with CGA      Balance Overall balance assessment: Needs assistance, History of Falls Sitting-balance support: No upper extremity supported, Feet supported Sitting balance-Leahy Scale: Fair     Standing balance support: Bilateral upper extremity supported, During functional activity, Reliant on assistive device for balance Standing balance-Leahy Scale: Poor Standing balance comment: RW support                           ADL either performed or assessed with clinical judgement   ADL Overall ADL's : Needs assistance/impaired Eating/Feeding: Independent;Sitting   Grooming: Standing;Wash/dry hands;Contact guard assist;Wash/dry face Grooming Details (indicate cue type and reason): pt with limited activity tolerance for standing ADLs, performed washing face in sitting Upper Body Bathing: Sitting;Contact guard assist   Lower Body Bathing: Sitting/lateral leans;Minimal assistance   Upper Body Dressing : Sitting;Minimal assistance   Lower Body Dressing: Sitting/lateral leans;Moderate assistance  Toilet Transfer: Contact guard assist;Stand-pivot;BSC/3in1;Rolling walker (2 wheels)   Toileting- Clothing  Manipulation and Hygiene: Contact guard assist;Sitting/lateral lean       Functional mobility during ADLs: Contact guard assist;Rolling walker (2 wheels);Cueing for safety (short distance in room)       Vision         Perception         Praxis         Pertinent Vitals/Pain Pain Assessment Pain Assessment: No/denies pain     Extremity/Trunk Assessment Upper Extremity Assessment Upper Extremity Assessment: Generalized weakness   Lower Extremity Assessment Lower Extremity Assessment: Generalized weakness   Cervical / Trunk Assessment Cervical / Trunk Assessment: Kyphotic   Communication Communication Communication: Impaired Factors Affecting Communication: Reduced clarity of speech   Cognition Arousal: Alert Behavior During Therapy: Flat affect Cognition: Cognition impaired   Orientation impairments: Time (thinks its '[birth year]??) Awareness: Online awareness impaired, Intellectual awareness intact Memory impairment (select all impairments): Working [employer] fast streamer, Conservation officer, historic buildings                       Following commands: Impaired Following commands impaired: Follows one step commands with increased time     Cueing  General Comments   Cueing Techniques: Verbal cues;Gestural cues;Tactile cues  VSS on 3L, pt with consistent labored breathing Sp02 >96% on 3L   Exercises     Shoulder Instructions      Home Living Family/patient expects to be discharged to:: Private residence Living Arrangements: Spouse/significant other Available Help at Discharge: Family;Available 24 hours/day Type of Home: Mobile home Home Access: Stairs to enter Entrance Stairs-Number of Steps: 3 Entrance Stairs-Rails: Right;Left;Can reach both Home Layout: One level     Bathroom Shower/Tub: Producer, television/film/video: Standard     Home Equipment: Shower seat - built in;Rollator (4 wheels);Rolling Walker (2 wheels)   Additional Comments: 2 falls per pt report.       Prior Functioning/Environment Prior Level of Function : Independent/Modified Independent             Mobility Comments: Mod I using Rollator ADLs Comments: Ind; husband assists with medication management    OT Problem List: Decreased cognition;Impaired balance (sitting and/or standing);Decreased safety awareness;Decreased strength;Decreased activity tolerance   OT Treatment/Interventions: Self-care/ADL training;Patient/family education;Therapeutic exercise;Balance training;Therapeutic activities;DME and/or AE instruction      OT Goals(Current goals can be found in the care plan section)   Acute Rehab OT Goals Patient Stated Goal: to get better OT Goal Formulation: With patient Time For Goal Achievement: 11/05/23 Potential to Achieve Goals: Good ADL Goals Pt Will Perform Grooming: standing;with supervision Pt Will Perform Lower Body Dressing: sit to/from stand;with supervision Pt Will Transfer to Toilet: with supervision;ambulating Pt/caregiver will Perform Home Exercise Program: Increased strength;Both right and left upper extremity;With theraband;With written HEP provided;Independently   OT Frequency:  Min 2X/week    Co-evaluation              AM-PAC OT "6 Clicks" Daily Activity     Outcome Measure Help from another person eating meals?: None Help from another person taking care of personal grooming?: A Little Help from another person toileting, which includes using toliet, bedpan, or urinal?: A Little Help from another person bathing (including washing, rinsing, drying)?: A Little Help from another person to put on and taking off regular upper body clothing?: A Little Help from another person to put on and taking off regular lower body clothing?: A Lot 6 Click Score:  18   End of Session Equipment Utilized During Treatment: Gait belt;Rolling walker (2 wheels) Nurse Communication: Mobility status  Activity Tolerance: Patient tolerated treatment  well Patient left: with call bell/phone within reach;in bed;with bed alarm set;with family/visitor present  OT Visit Diagnosis: Unsteadiness on feet (R26.81);Other abnormalities of gait and mobility (R26.89);Muscle weakness (generalized) (M62.81)                Time: 1610-9604 OT Time Calculation (min): 25 min Charges:  OT General Charges $OT Visit: 1 Visit OT Evaluation $OT Eval Moderate Complexity: 1 Mod OT Treatments $Self Care/Home Management : 8-22 mins  10/22/2023  AB, OTR/L  Acute Rehabilitation Services  Office: [phone]   Jorene New 10/22/2023, 12:08 PM

## 2023-10-22 NOTE — Progress Notes (Signed)
 PCCM note  Bronchoscopy results show squamous cell cancer.  Medical oncology and radiation oncology have already been consulted. PCCM team will be available as needed.  Please call with any questions.  Rashawn Rayman MD  Pulmonary & Critical care See Amion for pager  If no response to pager , please call [phone] until 7pm After 7:00 pm call Elink  [phone] 10/22/2023, 11:22 AM

## 2023-10-22 NOTE — Progress Notes (Signed)
 PHARMACY - ANTICOAGULATION CONSULT NOTE  Pharmacy Consult for Warfarin > heparin  bridge Indication: hx mechanical mitral valve  Allergies  Allergen Reactions   Ace Inhibitors Cough   Lipitor [Atorvastatin Calcium ] Other (See Comments)    Knee pain    Megace [Megestrol] Other (See Comments)    High blood pressure and blurred vision   Aspirin  Other (See Comments)    Nephrologist said to not take this Update 10/07/22 - patient clarifies this recommendation was lumped in with a whole bunch of other medications at that time many years ago when nephrologist told her to stay away from Tylenol , vitamins, ASA. Denies prior allergy or allergic reaction to this   Clorazepate Dipotassium Other (See Comments)    Interacts with a drug being taken   Simvastatin Other (See Comments)    Muscle pain   Tiotropium Bromide Monohydrate Other (See Comments)    Dry mouth   Codeine Nausea And Vomiting   Tape Rash    Patient Measurements: Height: 5\' 2"  (157.5 cm) Weight: 41.1 kg (90 lb 9.7 oz) IBW/kg (Calculated) : 50.1 HEPARIN  DW (KG): 42  Vital Signs: Temp: 97.4 F (36.3 C) (05/22 0455) Temp Source: Oral (05/22 0455) BP: 166/83 (05/22 0807) Pulse Rate: 78 (05/22 0807)  Labs: Recent Labs    10/20/23 0801 10/21/23 0601 10/21/23 0905 10/22/23 0554  HGB 8.4* 8.7*  --   --   HCT 25.8* 26.6*  --   --   PLT 259 229  --   --   LABPROT 22.0* 22.7*  --  25.2*  INR 1.9* 2.0*  --  2.3*  HEPARINUNFRC 0.22* 0.29*  --  0.15*  CREATININE 1.89*  --  1.45* 1.36*    Estimated Creatinine Clearance: 21 mL/min (A) (by C-G formula based on SCr of 1.36 mg/dL (H)).  Assessment: 83 yo F with a history of CAD, HF, s/p ICD, mitral valve prolapse s/p MVR (2007) and PAF, on warfarin PTA. Patient is presenting with SOB. Warfarin per pharmacy consult placed for mechanical valve.  INR 4.4 on admit and warfarin held.  CCM/Pulmonary consulted 5/14 for mediastinal mass and bronchoscopy which was completed 5/16.  PTA  warfarin regimen: 2.5 mg daily except 1.25 mg on Fridays and Sundays. Last taken 5/12 at 6pm.  Warfarin had been held since admission on 5/13 - of note did receive vitamin K 2.5 mg IV on 5/14. Warfarin resumed on 5/16.  Pt continues on heparin  bridge until INR therapeutic.  Pt lost IV access 5/21 and heparin  was off ~ 8 hours.  Heparin  restarted overnight at 800 units/hr with AM level subtherapeutic at 0.15.  INR trending up towards goal.  Anticipate further INR rise.  Will reduce to PTA Warfarin dose.  No further coughing up blood noted, Hgb stable 8s, platelets are normal.  Goal of Therapy:  Heparin  level 0.3-0.5 units/ml INR 2.5-3.5 Monitor platelets by anticoagulation protocol: Yes   Plan:  Increase heparin  to 900 units/hr Repeat heparin  level 1800 Warfarin 2.5 mg PO tonight Continue heparin  infusion until INR therapeutic  Daily heparin  level, PT/INR and CBC Monitor for s/sx of bleeding  Thank you for involving pharmacy in this patient's care.  Larue Pock, PharmD, BCPS Clinical Pharmacist Clinical phone for 10/22/2023 is [phone] 10/22/2023 9:09 AM

## 2023-10-22 NOTE — Consult Note (Signed)
 Radiation Oncology         ([phone]) [phone] ________________________________  Initial Inpatient Consultation - Conducted via telephone      Name: [name]        MRN: [id number]  Date of Service: 10/22/23              DOB: 01-11-1941  XL:[id number], Cris Dollar, MD    REFERRING PHYSICIAN: Dr. Lee Public   DIAGNOSIS: The primary encounter diagnosis was Mass in chest. Diagnoses of Pneumonia due to infectious organism, unspecified laterality, unspecified part of lung and Pleural effusion were also pertinent to this visit.   HISTORY OF PRESENT ILLNESS: [name] is a 83 y.o. female seen at the request of Dr. Gudena for a newly diagnosed lung cancer. The patient has had slow weight loss over months and complained of dysphagia and cough. Her husband states she's been seen at her PCP office at least 5 times in the last 6 months. She presented on 10/13/23 with coughing spell, shortness of breath, globus sensation and by CTPA that day did not have a PE but had a large subcarinal node causing mass effect and leftward shift of the esophagus and dense consolidation of the RLL with volume loss concerning for underlying mass. A tiny right pleural effusion was noted, and non specific bilateral adrenal nodularity was noted with cardiomegaly and evidence of emphysema and atherosclerosis.  A CT of the neck with contrast was also performed and showed debris in the lower cervical and upper thoracic esophagus without any acute findings.  She was taken with Dr. 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  She has been medically managed by the hospitalist service, and met with Dr. Lee Public yesterday.  He recommended consideration of urgent  radiation and we have contacted the patient without success but reached her husband and daughter by phone.    PREVIOUS RADIATION THERAPY: No   PAST MEDICAL HISTORY:  Past Medical History:  Diagnosis Date   Allergy    seasonal   Anemia    Anxiety    Arthritis    Asthma    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 Protecta   Biventricular ICD (implantable cardioverter-defibrillator) in place    with CRT   Blood transfusion without reported diagnosis    Bursitis    Cataract    RIGHT EYE   CHF (congestive heart failure) (HCC)    Chronic kidney disease    Colon polyp    adenomatous   Complication of anesthesia    patient stated that had difficulty getting the breathing tube removed, patient said that she stopped breathing and HR dropped to 10  patient then woke up and started breathing pateint stated no longer than one minute; re-intubated in PACU following cholecystectomy 10/28/13   COPD    Depression    Diverticulosis    Dysrhythmia    Fibromyalgia    GERD (gastroesophageal reflux disease)    Gout    H/O mitral valve replacement 2002, 2007   Heart murmur    Hemorrhoids    Hyperlipidemia    Hypertension    Hypothyroidism    IBS (irritable bowel syndrome)    MVP (mitral valve prolapse)    NAUSEA AND VOMITING 08/01/2009   Neuromuscular disorder (HCC)  fibromyalgia   Pacemaker    Peptic ulcer disease        PAST SURGICAL HISTORY: Past Surgical History:  Procedure Laterality Date   ABDOMINAL HYSTERECTOMY     BIV ICD GENERATOR CHANGEOUT N/A 04/29/2017   Procedure: BIV ICD GENERATOR CHANGEOUT;  Surgeon: Luana Rumple, MD;  [facility]: [facility];  Service: Cardiovascular;  Laterality: N/A;   BRONCHIAL BIOPSY  10/16/2023   Procedure: BRONCHOSCOPY, WITH BIOPSY;  Surgeon: Margaretann Sharper, MD;  Location: [facility] ENDOS[facility];  Service: Pulmonary;;   BRONCHIAL WASHINGS  10/16/2023   Procedure: IRRIGATION, BRONCHUS;  Surgeon: Margaretann Sharper, MD;  Location: [facility];  Service: Pulmonary;;   CARDIAC CATHETERIZATION  02/25/2006   normal left main, normal LAD, normal L Cfx, normal/dominant RCA (Dr. Aleda Ammon)   CARDIAC DEFIBRILLATOR PLACEMENT  2007, 11/202012   x2 (pacemaker) (Dr. Melven Stable. Croitoru)   CARDIAC VALVE REPLACEMENT  2002   MV repair - Dr. Isla Mari   CHOLECYSTECTOMY N/A 10/28/2013   Procedure: LAPAROSCOPIC CHOLECYSTECTOMY WITH INTRAOPERATIVE CHOLANGIOGRAM;  Surgeon: Quitman Bucy, MD;  Location: [facility] OR;  Service: General;  Laterality: N/A;   COLONOSCOPY     ENTEROSCOPY N/A 11/09/2021   Procedure: ENTEROSCOPY;  Surgeon: Normie Becton., MD;  Location: [facility] ENDOSCOPY;  Service: Gastroenterology;  Laterality: N/A;   ESOPHAGOGASTRODUODENOSCOPY (EGD) WITH PROPOFOL  N/A 03/02/2021   Procedure: ESOPHAGOGASTRODUODENOSCOPY (EGD) WITH PROPOFOL ;  Surgeon: Elois Hair, MD;  Location: [facility] ENDOSCOPY;  Service: Gastroenterology;  Laterality: N/A;   EYE SURGERY     FINE NEEDLE ASPIRATION  10/16/2023   Procedure: FINE NEEDLE ASPIRATION;  Surgeon: Margaretann Sharper, MD;  [facility]: [facility];  Service: Pulmonary;;   HEMOSTASIS CLIP PLACEMENT  03/02/2021   Procedure: HEMOSTASIS CLIP PLACEMENT;  Surgeon: Elois Hair, MD;  Location: [facility] ENDOSCOPY;  Service: Gastroenterology;;   HEMOSTASIS CLIP PLACEMENT  11/09/2021   Procedure: HEMOSTASIS CLIP PLACEMENT;  Surgeon: Normie Becton., MD;  Location: [facility] ENDOSCOPY;  Service: Gastroenterology;;   HOT HEMOSTASIS  03/02/2021   Procedure: HOT HEMOSTASIS (ARGON PLASMA COAGULATION/BICAP);  Surgeon: Elois Hair, MD;  Location: [facility] ENDOSCOPY;  Service: Gastroenterology;;   HOT HEMOSTASIS N/A 11/09/2021   Procedure: HOT HEMOSTASIS (ARGON PLASMA COAGULATION/BICAP);  Surgeon: Normie Becton., MD;  Location: [facility] ENDOSCOPY;  Service: Gastroenterology;  Laterality: N/A;   IMPLANTABLE CARDIOVERTER DEFIBRILLATOR (ICD) GENERATOR CHANGE N/A 04/10/2011    Procedure: ICD GENERATOR CHANGE;  Surgeon: Luana Rumple, MD;  [facility] C[facility];  Service: Cardiovascular;  Laterality: N/A;   INSERT / REPLACE / REMOVE PACEMAKER     KNEE ARTHROSCOPY     MITRAL VALVE REPLACEMENT  02/26/2006   re-do MVR w/101mm St. [name] (Dr. Cherylynn Cosier)   NM MYOCAR PERF WALL MOTION  2005   persantine myoview - low ris, EF 63%   RIGHT HEART CATH  04/03/2006   pulm cap wedge pressure 24/24, PA pressure 43/22 (mean ), CO 4.8, CI 4.1 (Dr. Jammie Mccune)   RIGHT/LEFT HEART CATH AND CORONARY ANGIOGRAPHY N/A 10/08/2022   Procedure: RIGHT/LEFT HEART CATH AND CORONARY ANGIOGRAPHY;  Surgeon: Millicent Ally, MD;  [facility];  Service: Cardiovascular;  Laterality: N/A;   SCLEROTHERAPY  03/02/2021   Procedure: SCLEROTHERAPY;  Surgeon: Elois Hair, MD;  Location: [facility] ENDOSCOPY;  Service: Gastroenterology;;   SUBMUCOSAL TATTOO INJECTION  11/09/2021   Procedure: SUBMUCOSAL TATTOO INJECTION;  Surgeon: Normie Becton., MD;  Location: [facility] ENDOSCOPY;  Service: Gastroenterology;;   TOTAL SHOULDER ARTHROPLASTY Right 12/27/2015  Procedure: RIGHT TOTAL SHOULDER ARTHROPLASTY;  Surgeon: Sammye Cristal, MD;  Location: [facility] OR;  Service: Orthopedics;  Laterality: Right;  Right total shoulder arthroplasty   TOTAL SHOULDER ARTHROPLASTY Left 10/27/2019   Procedure: REVERSE TOTAL SHOULDER ARTHROPLASTY;  Surgeon: Sammye Cristal, MD;  Location: [facility] ORS;  Service: Orthopedics;  Laterality: Left;   TRANSTHORACIC ECHOCARDIOGRAM  12/2011   EF 50-55%, mild global hypokinesis; LA severely dilated; calcification of anterior/posterior MV leaflets, bi-leaflets St. [name] mechanical MV; mild TR; trace AV regurg/pulm valve regurg   VIDEO BRONCHOSCOPY WITH ENDOBRONCHIAL ULTRASOUND Right 10/16/2023   Procedure: BRONCHOSCOPY, WITH EBUS;  Surgeon: Margaretann Sharper, MD;  Location: [facility] ENDOSCOPY;  Service: Pulmonary;  Laterality: Right;     FAMILY HISTORY:  Family History  Problem Relation Age  of Onset   Breast cancer Sister        multiple   Uterine cancer Sister        multiple   Heart disease Mother    Heart attack Mother    Heart disease Father    Kidney disease Father    Heart attack Father    Heart disease Sister    Crohn's disease Sister    Heart disease Son    Colon cancer Neg Hx    Stomach cancer Neg Hx      SOCIAL HISTORY:  reports that she quit smoking about 22 years ago. Her smoking use included cigarettes. She has never used smokeless tobacco. She reports current alcohol use. She reports that she does not use drugs.   ALLERGIES: Ace inhibitors, Lipitor [atorvastatin calcium ], Megace [megestrol], Aspirin , Clorazepate dipotassium, Simvastatin, Tiotropium bromide monohydrate, Codeine, and Tape   MEDICATIONS:  Current Facility-Administered Medications  Medication Dose Route Frequency Provider Last Rate Last Admin   acetaminophen  (TYLENOL ) tablet 650 mg  650 mg Oral Q6H PRN Swayze, Ava, DO   650 mg at 10/20/23 0500   albuterol  (PROVENTIL ) (2.5 MG/3ML) 0.083% nebulizer solution 3 mL  3 mL Inhalation Q6H PRN Krugh, Marissa C, DO   3 mL at 10/21/23 2254   ALPRAZolam  (XANAX ) tablet 0.25 mg  0.25 mg Oral QHS PRN Krugh, Marissa C, DO   0.25 mg at 10/22/23 0201   antiseptic oral rinse (BIOTENE) solution 15 mL  15 mL Mouth Rinse PRN Krugh, Marissa C, DO       budeson-glycopyrrolate -formoterol  (BREZTRI ) 160-9-4.8 MCG/ACT inhaler 2 puff  2 puff Inhalation BID Krugh, Marissa C, DO   2 puff at 10/22/23 1610   buPROPion  (WELLBUTRIN  SR) 12 hr tablet 150 mg  150 mg Oral q AM Krugh, Marissa C, DO   150 mg at 10/21/23 1032   feeding supplement (ENSURE ENLIVE / ENSURE PLUS) liquid 237 mL  237 mL Oral TID BM Aisha Hove, MD   237 mL at 10/21/23 2152   heparin  ADULT infusion 100 units/mL (25000 units/250mL)  800 Units/hr Intravenous Continuous Hammons, Kimberly B, RPH 8 mL/hr at 10/21/23 2343 800 Units/hr at 10/21/23 2343   icosapent  Ethyl (VASCEPA ) 1 g capsule 1 g  1 g  Oral BID Krugh, Marissa C, DO   1 g at 10/21/23 2152   levothyroxine  (SYNTHROID ) tablet 75 mcg  75 mcg Oral Q0600 Krugh, Marissa C, DO   75 mcg at 10/22/23 9604   magnesium  oxide (MAG-OX) tablet 400 mg  400 mg Oral BID Sreeram, Narendranath, MD   400 mg at 10/21/23 2153   metoCLOPramide  (REGLAN ) tablet 5 mg  5 mg Oral BID PRN Krugh, Marissa C, DO  metoprolol  succinate (TOPROL -XL) 24 hr tablet 25 mg  25 mg Oral BH-q7a Krugh, Marissa C, DO   25 mg at 10/21/23 6213   And   metoprolol  succinate (TOPROL -XL) 24 hr tablet 50 mg  50 mg Oral QPM Krugh, Marissa C, DO   50 mg at 10/21/23 1827   multivitamin with minerals tablet 1 tablet  1 tablet Oral Daily Sreeram, Narendranath, MD   1 tablet at 10/21/23 1033   pantoprazole  (PROTONIX ) EC tablet 40 mg  40 mg Oral QHS Rincon, Jennifer D, RPH   40 mg at 10/21/23 2153   pravastatin  (PRAVACHOL ) tablet 40 mg  40 mg Oral QHS Krugh, Marissa C, DO   40 mg at 10/21/23 2153   thiamine (VITAMIN B1) tablet 100 mg  100 mg Oral Daily Verlyn Goad, MD   100 mg at 10/21/23 1634   traMADol  (ULTRAM ) tablet 50 mg  50 mg Oral Q8H PRN Krugh, Marissa C, DO   50 mg at 10/20/23 1941   traZODone  (DESYREL ) tablet 25 mg  25 mg Oral QHS PRN Krugh, Marissa C, DO   25 mg at 10/21/23 2254   Warfarin - Pharmacist Dosing Inpatient   Does not apply q1600 Charlesetta Connors, DO   Given at 10/21/23 1636     REVIEW OF SYSTEMS: On review of systems, the patient's family states that she does get sundowning when she is hospitalized so this is not an ongoing issue over the years.  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  They acknowledged that she has been having dysphagia for months, weight loss since the end of last year, and chronic cough without complaints of hemoptysis. No other complaints are verbalized.      PHYSICAL EXAM:  Unable to assess due to encounter type   ECOG = 2  0  - Asymptomatic (Fully active, able to carry on all predisease activities without restriction)  1 - Symptomatic but completely ambulatory (Restricted in physically strenuous activity but ambulatory and able to carry out work of a light or sedentary nature. For example, light housework, office work)  2 - Symptomatic, <50% in bed during the day (Ambulatory and capable of all self care but unable to carry out any work activities. Up and about more than 50% of waking hours)  3 - Symptomatic, >50% in bed, but not bedbound (Capable of only limited self-care, confined to bed or chair 50% or more of waking hours)  4 - Bedbound (Completely disabled. Cannot carry on any self-care. Totally confined to bed or chair)  5 - Death   Aurea Blossom MM, Creech RH, Tormey DC, et al. [phone]). "Toxicity and response criteria of the [facility]". Am. Hillard Lowes. Oncol. 5 (6): 649-55    LABORATORY DATA:  Lab Results  Component Value Date   WBC 15.5 (H) 10/21/2023   HGB 8.7 (L) 10/21/2023   HCT 26.6 (L) 10/21/2023   MCV 93.3 10/21/2023   PLT 229 10/21/2023   Lab Results  Component Value Date   NA 133 (L) 10/22/2023   K 4.3 10/22/2023   CL 101 10/22/2023   CO2 24 10/22/2023   Lab Results  Component Value Date   ALT 20 10/14/2023   AST 30 10/14/2023   ALKPHOS 74 10/14/2023   BILITOT 0.7 10/14/2023      RADIOGRAPHY: CT HEAD WO CONTRAST ( ) Result Date: 10/19/2023 CLINICAL DATA:  Head  trauma, coagulopathy (Age 43-64y) Head trauma on coumadin  with INR 1.7 EXAM: CT HEAD WITHOUT CONTRAST TECHNIQUE: Contiguous axial images were obtained from the base of the skull through the vertex without intravenous contrast. RADIATION DOSE REDUCTION: This exam was performed according to the departmental dose-optimization program which includes automated exposure control, adjustment of the mA and/or kV according to patient size and/or use of iterative reconstruction technique. COMPARISON:  CT head 05/29/2023.  FINDINGS: Brain: No evidence of acute infarction, hemorrhage, hydrocephalus, extra-axial collection or mass lesion/mass effect. Advanced patchy white matter hypodensities, nonspecific but compatible with chronic microvascular ischemic disease. Vascular: No hyperdense vessel. Skull: Left posterior scalp hematoma.  No acute fracture. Sinuses/Orbits: Clear sinuses.  No acute orbital findings. IMPRESSION: 1. No evidence of acute intracranial abnormality. 2. Left posterior scalp hematoma. 3. Advanced chronic microvascular ischemic disease. Electronically Signed   By: Stevenson Elbe M.D.   On: 10/19/2023 03:15   US  RENAL Result Date: 10/18/2023 CLINICAL DATA:  [id number] Acute renal failure superimposed on stage 3b chronic kidney disease (HCC) [id number] EXAM: RENAL / URINARY TRACT ULTRASOUND COMPLETE COMPARISON:  Oct 03, 2022 FINDINGS: Evaluation is limited secondary to limited acoustic windows and inability to hold breath. Right Kidney: Renal measurements: 8.2 x 3.7 x 4.9 cm = volume: 66 mL. Echogenicity is increased. No hydronephrosis visualized. Benign cyst noted measuring 6 mm (for which no dedicated imaging follow-up is recommended). Left Kidney: Renal measurements: 9.6 x 5.7 x 4.7 cm = volume: 134 mL. Echogenicity is mildly increased. No hydronephrosis visualized. Exophytic cyst measuring up to 11 mm, suboptimally assessed If persistent clinical concern for traumatic injury, recommend dedicated cross-sectional imaging. Bladder: Appears normal for degree of bladder distention. Other: RIGHT pleural effusion. IMPRESSION: 1. No hydronephrosis. 2. Increased renal echogenicity as can be seen in medical renal disease. 3. RIGHT pleural effusion. Electronically Signed   By: Clancy Crimes M.D.   On: 10/18/2023 16:56   DG CHEST PORT 1 VIEW Result Date: 10/18/2023 CLINICAL DATA: Shortness of breath EXAM: PORTABLE CHEST 1 VIEW COMPARISON:  10/16/2023 FINDINGS: The cardio pericardial silhouette is enlarged. Right base  collapse/consolidation is similar to prior with small right pleural effusion. Interstitial coarsening at the left base is similar. Status post cardiac valve replacement. Left-sided permanent pacemaker evident with multiple additional pacer leads overlying the chest. Status post left shoulder replacement. Bones are diffusely demineralized. IMPRESSION: Right base collapse/consolidation with small right pleural effusion, similar to prior. Electronically Signed   By: Donnal Fusi M.D.   On: 10/18/2023 05:32   DG Chest Port 1 View Result Date: 10/16/2023 CLINICAL DATA:  Status post bronchoscopy. EXAM: PORTABLE CHEST 1 VIEW COMPARISON:  Chest radiograph dated 10/13/2023 and CT dated 10/13/2023. FINDINGS: Bilobed of emphysema. Right lower lobe opacity better evaluated on CT. No pneumothorax. Mild cardiomegaly. Median sternotomy wires and left AICD device. No acute osseous pathology. Bilateral shoulder arthroplasties. IMPRESSION: 1. No pneumothorax. 2. Right lower lobe opacity better evaluated on CT. Electronically Signed   By: Angus Bark M.D.   On: 10/16/2023 12:02   ECHOCARDIOGRAM COMPLETE Result Date: 10/14/2023    ECHOCARDIOGRAM REPORT   Patient Name:   [name] [facility] Date of Exam: 10/14/2023 Medical Rec #:  [id number]           Height:       62.0 in Accession #:    [id number]          Weight:       92.6 lb Date of Birth:  [birth year]  BSA:          1.378 m Patient Age:    83 years            BP:           135/66 mmHg Patient Gender: F                   HR:           90 bpm. Exam Location:  Inpatient Procedure: 2D Echo (Both Spectral and Color Flow Doppler were utilized during            procedure). Indications:    Chest Pain R07.9  History:        Patient has prior history of Echocardiogram examinations, most                 recent 02/20/2023. CHF, COPD, Mitral Valve Disease; Risk                 Factors:Hypertension.                  Mitral Valve: 25 mm St. [name] valve is present in the mitral                  position. Procedure Date: 02/26/2006.  Sonographer:    Astrid Blamer Referring Phys: [id number] MARISSA C KRUGH IMPRESSIONS  1. Left ventricular ejection fraction, by estimation, is 25%. The left ventricle has severely decreased function. The left ventricle demonstrates global hypokinesis. The left ventricular internal cavity size was moderately dilated. Left ventricular diastolic parameters are indeterminate.  2. Right ventricular systolic function is mildly reduced. The right ventricular size is normal. Tricuspid regurgitation signal is inadequate for assessing PA pressure.  3. The mitral valve has been repaired/replaced. Trivial mitral valve regurgitation likely normal washing jet. The mean mitral valve gradient is 4.0 mmHg with average heart rate of 86 bpm. There is a 25 mm St. [name] present in the mitral position. Procedure Date: 02/26/2006. Echo findings are consistent with normal structure and function of the mitral valve prosthesis.  4. The aortic valve is tricuspid. There is mild calcification of the aortic valve. Aortic valve regurgitation is mild. No aortic stenosis is present.  5. The inferior vena cava is dilated in size with <50% respiratory variability, suggesting right atrial pressure of 15 mmHg. FINDINGS  Left Ventricle: Left ventricular ejection fraction, by estimation, is 25%. The left ventricle has severely decreased function. The left ventricle demonstrates global hypokinesis. The left ventricular internal cavity size was moderately dilated. There is  no left ventricular hypertrophy. Left ventricular diastolic parameters are indeterminate. Right Ventricle: The right ventricular size is normal. No increase in right ventricular wall thickness. Right ventricular systolic function is mildly reduced. Tricuspid regurgitation signal is inadequate for assessing PA pressure. Left Atrium: Left atrial size was normal in size. Right Atrium: Right atrial size was normal in size. Pericardium: There is  no evidence of pericardial effusion. Mitral Valve: The mitral valve has been repaired/replaced. Trivial mitral valve regurgitation. There is a 25 mm St. [name] present in the mitral position. Procedure Date: 02/26/2006. Echo findings are consistent with normal structure and function of the mitral valve prosthesis. MV peak gradient, 6.8 mmHg. The mean mitral valve gradient is 4.0 mmHg with average heart rate of 86 bpm. Tricuspid Valve: The tricuspid valve is normal in structure. Tricuspid valve regurgitation is trivial. No evidence of tricuspid stenosis. Aortic Valve: The aortic valve is tricuspid. There is mild calcification of the  aortic valve. Aortic valve regurgitation is mild. Aortic regurgitation PHT measures 562 msec. No aortic stenosis is present. Aortic valve mean gradient measures 4.0 mmHg. Aortic valve peak gradient measures 6.5 mmHg. Aortic valve area, by VTI measures 2.17 cm. Pulmonic Valve: The pulmonic valve was grossly normal. Pulmonic valve regurgitation is trivial. No evidence of pulmonic stenosis. Aorta: The aortic root is normal in size and structure. Venous: The inferior vena cava is dilated in size with less than 50% respiratory variability, suggesting right atrial pressure of 15 mmHg. IAS/Shunts: No atrial level shunt detected by color flow Doppler.  LEFT VENTRICLE PLAX 2D LVIDd:         4.80 cm LVIDs:         4.00 cm LV PW:         0.90 cm LV IVS:        0.90 cm LVOT diam:     1.90 cm LV SV:         51 LV SV Index:   37 LVOT Area:     2.84 cm  RIGHT VENTRICLE TAPSE (M-mode): 1.0 cm LEFT ATRIUM             Index        RIGHT ATRIUM          Index LA Vol (A2C):   34.4 ml 24.96 ml/m  RA Area:     5.91 cm LA Vol (A4C):   38.5 ml 27.94 ml/m  RA Volume:   8.12 ml  5.89 ml/m LA Biplane Vol: 37.0 ml 26.85 ml/m  AORTIC VALVE AV Area (Vmax):    2.37 cm AV Area (Vmean):   1.95 cm AV Area (VTI):     2.17 cm AV Vmax:           127.00 cm/s AV Vmean:          103.000 cm/s AV VTI:            0.234 m AV  Peak Grad:      6.5 mmHg AV Mean Grad:      4.0 mmHg LVOT Vmax:         106.00 cm/s LVOT Vmean:        70.700 cm/s LVOT VTI:          0.179 m LVOT/AV VTI ratio: 0.76 AI PHT:            562 msec  AORTA Ao Root diam: 3.10 cm MITRAL VALVE MV Area (PHT): 3.65 cm     SHUNTS MV Area VTI:   1.84 cm     Systemic VTI:  0.18 m MV Peak grad:  6.8 mmHg     Systemic Diam: 1.90 cm MV Mean grad:  4.0 mmHg MV Vmax:       1.31 m/s MV Vmean:      96.5 cm/s MV Decel Time: 208 msec MV E velocity: 126.00 cm/s MV A velocity: 128.00 cm/s MV E/A ratio:  0.98 Grady Lawman MD Electronically signed by Grady Lawman MD Signature Date/Time: 10/14/2023/1:19:06 PM    Final    CT Soft Tissue Neck W Contrast Result Date: 10/13/2023 CLINICAL DATA:  Stridor and chest pain EXAM: CT NECK WITH CONTRAST TECHNIQUE: Multidetector CT imaging of the neck was performed using the standard protocol following the bolus administration of intravenous contrast. RADIATION DOSE REDUCTION: This exam was performed according to the departmental dose-optimization program which includes automated exposure control, adjustment of the mA and/or kV according to patient size and/or use of iterative  reconstruction technique. CONTRAST:  75mL OMNIPAQUE  IOHEXOL  350 MG/ML SOLN COMPARISON:  None Available. FINDINGS: Pharynx and larynx: Normal. No mass or swelling. Salivary glands: No inflammation, mass, or stone. Thyroid: Normal Lymph nodes: None enlarged or abnormal density. Vascular: Numerous small caliber collaterals surrounding the spinal column. Major vessels are patent. Calcific aortic atherosclerosis. Limited intracranial: Normal Visualized orbits: Normal Mastoids and visualized paranasal sinuses: Clear Skeleton: Negative Upper chest: Biapical emphysema. Other: Patulous debris containing lower cervical and upper thoracic esophagus. IMPRESSION: 1. Patulous debris containing lower cervical and upper thoracic esophagus. 2. No acute abnormality of the neck. Aortic  Atherosclerosis (ICD10-I70.0) and Emphysema (ICD10-J43.9). Electronically Signed   By: Juanetta Nordmann M.D.   On: 10/13/2023 19:45   CT Angio Chest PE W and/or Wo Contrast Result Date: 10/13/2023 CLINICAL DATA:  Chest discomfort, difficulty breathing, stridor, throat tightness EXAM: CT ANGIOGRAPHY CHEST WITH CONTRAST TECHNIQUE: Multidetector CT imaging of the chest was performed using the standard protocol during bolus administration of intravenous contrast. Multiplanar CT image reconstructions and MIPs were obtained to evaluate the vascular anatomy. RADIATION DOSE REDUCTION: This exam was performed according to the departmental dose-optimization program which includes automated exposure control, adjustment of the mA and/or kV according to patient size and/or use of iterative reconstruction technique. CONTRAST:  75mL OMNIPAQUE  IOHEXOL  350 MG/ML SOLN COMPARISON:  10/13/2023, 06/22/2022 FINDINGS: Cardiovascular: This is a technically adequate evaluation of the pulmonary vasculature. No filling defects or pulmonary emboli. The heart is enlarged, with marked left ventricular dilatation. Multi lead cardiac pacer again identified. No evidence of thoracic aortic aneurysm or dissection. Atherosclerosis throughout the aorta and coronary vasculature. 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 Mediastinum/Nodes: Subcarinal soft tissue mass measures up to 3.2 x 5.6 cm, reference image 63/4. This has mass effect upon the midthoracic esophagus, which is deviated to the left. This results in proximal esophageal dilatation and retained secretions. The trachea appears unremarkable. Thyroid is not well visualized. Lungs/Pleura: There is complete consolidation with volume loss in the right lower lobe. There is opacification of the bronchus intermedius and right lower lobe bronchus. Central  obstructing neoplasm cannot be excluded. Upper lobe predominant emphysema. Trace right pleural effusion. No pneumothorax. Upper Abdomen: Bilateral adrenal nodules, measuring 14 mm on the right and 10 mm on the left. No other acute upper abdominal findings. Musculoskeletal: No acute or destructive bony abnormalities. Bilateral shoulder arthroplasties. Reconstructed images demonstrate no additional findings. Review of the MIP images confirms the above findings. IMPRESSION: 1. Large soft tissue mass in the subcarinal region, with mass effect upon the midthoracic esophagus. Findings compatible with malignancy or adenopathy. 2. Dense consolidation of the right lower lobe with volume loss, as well as opacification of the bronchus intermedius and right lower lobe bronchial tree. Central obstructing neoplasm is suspected. Bronchoscopy may be useful. 3. Trace right pleural effusion. 4. No evidence of pulmonary embolus. 5. Nonspecific bilateral adrenal nodularity, metastatic disease not excluded in light of intrathoracic findings. 6. Cardiomegaly with prominent left ventricular dilatation. 7. Aortic Atherosclerosis (ICD10-I70.0) and Emphysema (ICD10-J43.9). Electronically Signed   By: Bobbye Burrow M.D.   On: 10/13/2023 18:51   DG Chest 2 View Result Date: 10/13/2023 CLINICAL DATA:  Shortness of breath.  Wheezing. EXAM: CHEST - 2 VIEW COMPARISON:  Chest radiographs 10/03/2022, 06/20/2022, 11/18/2021 FINDINGS: Status post median sternotomy and mitral valve prosthesis, unchanged. Mild high-grade atherosclerotic calcifications within the aortic arch. Left chest wall  AICD with leads overlying the right atrium, right antrum ventricle, and coronary sinus. Cardiac silhouette is again at the upper limits of normal size. Mediastinal contours are within normal limits. Mild bilateral interstitial thickening is actually improved from 10/03/2022 and 06/20/2022 and may represent the patient's normal baseline, mild chronic fibrosis.  Small to moderate right pleural effusion and associated right basilar heterogeneous airspace opacification appears to be new from prior. No pneumothorax. Status post reverse total left shoulder arthroplasty and standard total right shoulder arthroplasty. IMPRESSION: 1. Small to moderate right pleural effusion and associated right basilar heterogeneous airspace opacification appears to be new from prior. This may represent atelectasis or pneumonia. 2. Mild bilateral interstitial thickening is actually improved from 10/03/2022 and 06/20/2022 and may represent the patient's normal baseline, mild chronic fibrosis. Electronically Signed   By: Bertina Broccoli M.D.   On: 10/13/2023 16:32       IMPRESSION/PLAN: 1. 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 Dr. Jeryl Moris has reviewed her imaging results and work up to date. While she still needs to be completed with MRI brain and outpatient PET scan, her symptoms and consequence of her disease are of priority and Dr. Jeryl Moris recommends urgent palliative radiation to the chest. We discussed the risks, benefits, short, and long term effects of radiotherapy, as well as the curative intent, and the patient's family and surrogate decision makers are interested in proceeding. We will communciate with the patient as well but with her memory disruptions and inability to discuss by phone we will review this when she comes for simulation today. With her husband and daughter, we discussed delivery and logistics of radiotherapy and the recommendation of 10 fractions of radiation over about 2 weeks.We will coordinate with her floor team for carelink transfer, simulation this morning, and mark and start approach to radiation treatment beginning today. She will be transferred to [facility] by her  hospitalist team.  2. Changes in short term term memory. The patient's husband and daughter acknowledge these changes as chronic but the hospitalist team is also evaluating her to rule out acute changes. 3. Demand Ischemia, Hyponatremia and acute kidney injury with chronic kidney disease, chronic cardiac conditions. We appreciate the hospitalist service as they manage and maintain control over these issues.    This encounter was conducted via telephone.  The patient's husband and surrogate decision maker has provided two factor identification and has given verbal consent for this type of encounter and has been advised to only accept a meeting of this type in a secure network environment. The time spent during this encounter was 60 minutes including preparation, discussion, and coordination of the patient's care. The attendants for this meeting include   Bettejane Brownie  and Renie Carver and the patient's daughter Sheilah Denver During the encounter,   Bettejane Brownie was located at [facility] Radiation Oncology Department.  Jnae Thomaston was located at the hospital but was unable to participate initially. Renie Carver and the patient's daughter Sheilah Denver were available remotely by phone as well.    Shelvia Dick, [facility]   **Disclaimer: This note was dictated with voice recognition software. Similar sounding words can inadvertently be transcribed and this note may contain transcription errors which may not have been corrected upon publication of note.**

## 2023-10-22 NOTE — Progress Notes (Signed)
 Daily Progress Note   Patient Name: [name]       Date: 10/22/2023 DOB: 11-06-1940  Age: 83 y.o. MRN#: [id number] Attending Physician: Verlyn Goad, MD Primary Care Physician: Bertha Broad, MD Admit Date: 10/13/2023 Length of Stay: 9 days  Reason for Consultation/Follow-up: Establishing goals of care  Subjective:   Contacted by ICU team.  Patient was on the floor and had a rapid response called due to increased work of breathing.  Chest x-ray obtained showed collapse of right middle and lower lobe.  Patient transferred to ICU for higher level of care.  Prior to transfer hospitalist had reached out to patient's husband and CODE STATUS had been updated to DNR though would intubate. Upon review patient's EMR, patient had detailed discussion with palliative medicine provider on 10/21/2023 stating that she wanted to be DNR/DNI and did not want to be on artificial measures to support her life if it was her time.  This was verified with spouse, [name], present on speaker phone.  Patient also has ACP documentation discussing wishes that she not have her life prolonged by machines and this was not to be overridden by her healthcare power of attorney.   ------------------------------------------------------------------------------------------------------------- Advance Care Planning Conversation  Pertinent diagnosis: squamous cell carcinoma of lung, acute on chronic respiratory failure, systolic/diastolic heart failure post ICD, COPD  The patient and/or family consented to a voluntary Advance Care Planning Conversation in person. Individuals present for the conversation: Patient unable to participate in complex medical decision-making discussion due to medical status.  Discussed all care with patient's husband, [name].  This palliative provider was present for entirety of conversations.  Summary of the conversation:   Presented to bedside to see patient on 6 floor.  Patient was  transferring to ICU.  Patient confused and noted to have increased work of breathing.  Able to speak with patient's husband separately while patient was being transferred.  Introduced myself as a member of the palliative medicine team.  Discussed patient's current medical condition.  Reviewed patient's CODE STATUS and patient stated wishes to not be on life support.  Also explained how putting patient on life support would not lead to quality of life outcomes based on patient's previously stated wishes.  Patient would not be receiving radiation which was noted that this would not work immediately to improve her tumor size either.  After extensive discussion, husband agreeing with supporting patient being DNR/DNI.  Spent time providing emotional support and escorted husband down to ICU.  Able to go see patient while husband was in ICU waiting area.  Upon examination of this time, patient noted to be in distress with increased work of breathing.  Patient moaning and appears anxious.  It was at this time was informed about patient's collapsed lung by ICU providers. Able to go visit with the husband in ICU waiting area along with PCCM provider Vallery Gavel, NP.  Explained patient's worsening medical status and how interventions at this time would purely be focused on palliative support.  Discussed with patient's deteriorating status, transition to comfort focused care at this time.  Husband supporting transition to full comfort focused care knowing that patient is at the end of life.  Expressed concern that based on patient's respiratory status, patient could die even today.  Discussed would start medications for patient's work of breathing, pain management, and anxiety.  Husband acknowledged this and noted he wanted patient to be comfortable at the end of life.  Husband noted he will call  patient's daughters to update them.  All questions answered at that time.  Noted palliative medicine team to continue to follow on  the patient's medical journey.  Outcome of the conversations and/or documents completed:  Patient now transition to full comfort focused care.  Anticipate in-hospital death.  I spent 30 minutes providing separately identifiable ACP services with the patient and/or surrogate decision maker in a voluntary, in-person conversation discussing the patient's wishes and goals as detailed in the above note.  Barnett Libel, DO Palliative Medicine Provider  -------------------------------------------------------------------------------------------------------------  Discussed care with team including hospitalist, PCCM providers, and RNs to coordinate care.  Objective:   Vital Signs:  BP (!) 169/72 (BP Location: Left Arm)   Pulse 78   Temp (!) 97.4 F (36.3 C) (Oral)   Resp 12   Ht 5\' 2"  (1.575 m)   Wt 41.1 kg   SpO2 (!) 89%   BMI 16.57 kg/m   Physical Exam: General: Ill-appearing, unable to participate in conversation, cachectic, frail Cardiovascular: RRR Respiratory: Severely increased work of breathing noted Neuro: Moaning, unable to participate in conversation  Imaging:  I personally reviewed recent imaging.   Assessment & Plan:   Assessment: [name] is a 83 yr old woman who presented to the ED on 10/13/2023 with complaints of severe coughing and occasional hemoptysis going on for several months. 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 Palliative care has been asked to support additional goals of care conversations in the setting of mediastinal mass.   Recommendations/Plan: # Complex medical decision making/goals of care:  # Complex medical decision making/goals of care  -Patient unable to participate in medical decision making secondary to mental status.  -Spoke with with patient's husband, [name], as detailed above in HPI.  Patient had rapid deterioration in status due to  collapse of the right middle and lower lung.  After discussions with husband, patient would be transition to full comfort focused care at this time.  Husband does not want patient to suffer at the end of life.  Discussed with husband anticipate in-hospital death.  -At this time we will discontinue interventions that are no longer focused on comfort such as IV fluids, imaging, or lab work.  Will instead focus on symptom management of pain, dyspnea, and agitation in the setting of end-of-life care.    Code Status: Do not attempt resuscitation (DNR) - Comfort care  # Symptom management Patient is receiving these palliative interventions for symptom management with an intent to improve quality of life.     -Pain/Dyspnea, acute in the setting of end-of-life care                Patient was not on medications for pain previously.                               -Start IV hydromorphone  continuous infusion due to severe work of breathing with boluses as needed.  Continue to titrate based on patient's symptom burden.                  -Anxiety/agitation, in the setting of end-of-life care                               -Start IV Ativan 1 mg every 4 hours as needed. Continue to adjust  based on patient's symptom burden.                                 -Start IV Haldol 1 mg every 4 hours as needed. Continue to adjust based on patient's symptom burden.                   -Secretions, in the setting of end-of-life care                               -Start IV glycopyrrolate  0.2 mg every 4 hours as needed.  # Psychosocial Support:  - Husband, daughters  # Discharge Planning: Anticipated Hospital Death  Thank you for allowing the palliative care team to participate in the care [facility].  Barnett Libel, DO Palliative Care Provider PMT # [phone]  If patient remains symptomatic despite maximum doses, please call PMT at [phone] between 0700 and 1900. Outside of these hours, please call attending,  as PMT does not have night coverage.

## 2023-10-22 NOTE — Plan of Care (Signed)

## 2023-10-22 NOTE — Progress Notes (Addendum)
   Patient Name: [name] Date of Encounter: 10/22/2023 [facility] Cardiologist: Lauro Portal, MD   Interval Summary  .    Spoke to palliative care yesterday Squamous cell cancer dx.   Vital Signs .    Vitals:   10/21/23 2010 10/22/23 0455 10/22/23 0807 10/22/23 0852  BP:  (!) 159/72 (!) 166/83   Pulse:  79 78   Resp:  18 16   Temp:  (!) 97.4 F (36.3 C)    TempSrc:  Oral    SpO2: 97% 100% 97% 97%  Weight:  41.1 kg    Height:        Intake/Output Summary (Last 24 hours) at 10/22/2023 0926 Last data filed at 10/22/2023 0158 Gross per 24 hour  Intake 300 ml  Output 200 ml  Net 100 ml      10/22/2023    4:55 AM 10/20/2023    4:31 AM 10/14/2023    4:51 AM  Last 3 Weights  Weight (lbs) 90 lb 9.7 oz 97 lb 92 lb 9.5 oz  Weight (kg) 41.1 kg 44 kg 42 kg            Physical Exam .   GEN: No acute distress.   Neck: No JVD Cardiac: Sharp S1 RRR, no murmurs, rubs, or gallops.  Respiratory: Wheeze bilaterally. GI: Soft, nontender, non-distended  MS: No edema  Assessment & Plan .     83 year old with large intrathoracic soft tissue mass - Squamous cell Lung cancer with adrenal mets Paroxysmal atrial fibrillation Mechanical mitral valve ICD Chronic systolic heart failure EF 25%    INR today pending. Was 2.0 prior. Goal 2.5-3.5. Warfarin per pharmacy.  Continue with metoprolol  25/50 Pravastatin  40 and Vascepa  for cholesterol Challenging - dyspnea in part from lung malig.   No changes today. Agree with palliative care. DNR. Will sign off. Please reach out if needed.   For questions or updates, please contact [facility] Please consult [email or web address] for contact info under        Signed, Dorothye Gathers, MD

## 2023-10-22 NOTE — Evaluation (Signed)
 Physical Therapy Evaluation Patient Details Name: [name] MRN: [id number] DOB: Mar 30, 1941 Today's Date: 10/22/2023  History of Present Illness  83 y.o. female admitted 10/13/23 with hemoptysis and cough.  Pt with soft tissue mass 3.2 x 5.6 with mass effect on the esophagus which is deviated to the left, Squamous cell carcinoma, right lung with adrenal mets. 5/16 bronchoscopy. 5/19 fall with head CT, posterior scalp hematoma. PMhx: CAD, CHF, PAF on anticoagulation, ICD, mitral valve prolapse s/p MVR, HLD, lt reverse TSA, Rt TSA, CKD, neuromuscular disorder, gout, COPD, anxiety  Clinical Impression  Pt pleasant, attempting to get OOB on arrival with no awareness for call button use or IV. Pt with decreased clarity of speech, confused regarding place, time and situation. Pt with decreased strength, function, gait and transfers who will benefit from acute therapy to maximize mobility and safety. If family can provide 24hr assist for cognition and mobility return home is appropriate with HHPT vs requiring post acute therapy.         If plan is discharge home, recommend the following: A little help with walking and/or transfers;A little help with bathing/dressing/bathroom;Assistance with cooking/housework;Direct supervision/assist for financial management;Supervision due to cognitive status;Assist for transportation;Help with stairs or ramp for entrance;Direct supervision/assist for medications management   Can travel by private vehicle        Equipment Recommendations BSC/3in1  Recommendations for Other Services       Functional Status Assessment Patient has had a recent decline in their functional status and demonstrates the ability to make significant improvements in function in a reasonable and predictable amount of time.     Precautions / Restrictions Precautions Precautions: Fall;Other (comment) Recall of Precautions/Restrictions: Impaired Precaution/Restrictions Comments: watch  sats      Mobility  Bed Mobility Overal bed mobility: Needs Assistance Bed Mobility: Supine to Sit     Supine to sit: Contact guard     General bed mobility comments: CGA for lines and safety, HOB 15 degrees    Transfers Overall transfer level: Needs assistance   Transfers: Sit to/from Stand, Bed to chair/wheelchair/BSC Sit to Stand: Min assist Stand pivot transfers: Min assist         General transfer comment: min assist to rise from bed, recliner and BSC. stand pivot bSC<>recliner with min assist    Ambulation/Gait Ambulation/Gait assistance: Contact guard assist Gait Distance (Feet): 15 Feet Assistive device: Rolling walker (2 wheels) Gait Pattern/deviations: Step-through pattern, Decreased stride length, Trunk flexed   Gait velocity interpretation: <1.8 ft/sec, indicate of risk for recurrent falls   General Gait Details: reliant on RW with pt able to walk around the bed to chair with cues for safety and assist for IV, limited by fatigue. On 3L with SPO2 92-98%, HR 94  Stairs            Wheelchair Mobility     Tilt Bed    Modified Rankin (Stroke Patients Only)       Balance Overall balance assessment: Needs assistance, History of Falls Sitting-balance support: No upper extremity supported, Feet supported Sitting balance-Leahy Scale: Fair     Standing balance support: Bilateral upper extremity supported, During functional activity, Reliant on assistive device for balance Standing balance-Leahy Scale: Poor Standing balance comment: Rw in standing                             Pertinent Vitals/Pain Pain Assessment Pain Assessment: No/denies pain    Home Living Family/patient expects  to be discharged to:: Private residence Living Arrangements: Spouse/significant other Available Help at Discharge: Family;Available 24 hours/day Type of Home: Mobile home Home Access: Stairs to enter Entrance Stairs-Rails: Right;Left;Can reach  both Entrance Stairs-Number of Steps: 3   Home Layout: One level Home Equipment: Shower seat - built in;Rollator (4 wheels);Rolling Walker (2 wheels) Additional Comments: 2 falls per pt report.    Prior Function Prior Level of Function : Independent/Modified Independent             Mobility Comments: Mod I using Rollator ADLs Comments: Ind; husband assists with medication management     Extremity/Trunk Assessment   Upper Extremity Assessment Upper Extremity Assessment: Generalized weakness    Lower Extremity Assessment Lower Extremity Assessment: Generalized weakness    Cervical / Trunk Assessment Cervical / Trunk Assessment: Kyphotic  Communication   Communication Communication: Impaired Factors Affecting Communication: Reduced clarity of speech    Cognition Arousal: Alert Behavior During Therapy: Flat affect   PT - Cognitive impairments: No family/caregiver present to determine baseline, Orientation, Awareness, Memory, Safety/Judgement, Problem solving   Orientation impairments: Time, Situation, Place                   PT - Cognition Comments: pt stating year as 2060 and place "the new house". Pt attempting to exit bed no arrival with IV attached, rail up and alarm on Following commands: Impaired Following commands impaired: Follows one step commands with increased time     Cueing Cueing Techniques: Verbal cues, Gestural cues, Tactile cues     General Comments      Exercises     Assessment/Plan    PT Assessment Patient needs continued PT services  PT Problem List Decreased strength;Decreased activity tolerance;Decreased balance;Decreased mobility;Decreased cognition;Decreased knowledge of use of DME;Decreased safety awareness       PT Treatment Interventions DME instruction;Gait training;Stair training;Functional mobility training;Therapeutic activities;Therapeutic exercise;Patient/family education;Cognitive remediation;Neuromuscular  re-education;Balance training    PT Goals (Current goals can be found in the Care Plan section)  Acute Rehab PT Goals Patient Stated Goal: return home PT Goal Formulation: With patient Time For Goal Achievement: 11/05/23 Potential to Achieve Goals: Good    Frequency Min 2X/week     Co-evaluation               AM-PAC PT "6 Clicks" Mobility  Outcome Measure Help needed turning from your back to your side while in a flat bed without using bedrails?: A Little Help needed moving from lying on your back to sitting on the side of a flat bed without using bedrails?: A Little Help needed moving to and from a bed to a chair (including a wheelchair)?: A Little Help needed standing up from a chair using your arms (e.g., wheelchair or bedside chair)?: A Little Help needed to walk in hospital room?: A Little Help needed climbing 3-5 steps with a railing? : A Lot 6 Click Score: 17    End of Session Equipment Utilized During Treatment: Oxygen Activity Tolerance: Patient tolerated treatment well Patient left: in chair;with call bell/phone within reach;with chair alarm set Nurse Communication: Mobility status PT Visit Diagnosis: Other abnormalities of gait and mobility (R26.89);Difficulty in walking, not elsewhere classified (R26.2);Muscle weakness (generalized) (M62.81)    Time: 4166-0630 PT Time Calculation (min) (ACUTE ONLY): 20 min   Charges:   PT Evaluation $PT Eval Moderate Complexity: 1 Mod   PT General Charges $$ ACUTE PT VISIT: 1 Visit         Joshva Labreck P, PT  Acute Rehabilitation Services Office: [phone]   Luwana Salvo 10/22/2023, 10:25 AM

## 2023-10-23 ENCOUNTER — Ambulatory Visit: Admitting: Radiation Oncology

## 2023-10-23 ENCOUNTER — Ambulatory Visit

## 2023-10-23 ENCOUNTER — Encounter: Payer: Self-pay | Admitting: Radiation Oncology

## 2023-10-23 DIAGNOSIS — E43 Unspecified severe protein-calorie malnutrition: Secondary | ICD-10-CM | POA: Diagnosis not present

## 2023-10-23 DIAGNOSIS — I2489 Other forms of acute ischemic heart disease: Secondary | ICD-10-CM | POA: Diagnosis not present

## 2023-10-23 DIAGNOSIS — J439 Emphysema, unspecified: Secondary | ICD-10-CM | POA: Insufficient documentation

## 2023-10-23 DIAGNOSIS — C3491 Malignant neoplasm of unspecified part of right bronchus or lung: Secondary | ICD-10-CM | POA: Diagnosis not present

## 2023-10-23 DIAGNOSIS — J189 Pneumonia, unspecified organism: Secondary | ICD-10-CM | POA: Diagnosis not present

## 2023-10-23 DIAGNOSIS — N183 Chronic kidney disease, stage 3 unspecified: Secondary | ICD-10-CM | POA: Insufficient documentation

## 2023-10-23 DIAGNOSIS — J9601 Acute respiratory failure with hypoxia: Secondary | ICD-10-CM

## 2023-10-23 DIAGNOSIS — D6869 Other thrombophilia: Secondary | ICD-10-CM | POA: Insufficient documentation

## 2023-10-23 DIAGNOSIS — N179 Acute kidney failure, unspecified: Secondary | ICD-10-CM | POA: Insufficient documentation

## 2023-10-23 DIAGNOSIS — C3490 Malignant neoplasm of unspecified part of unspecified bronchus or lung: Secondary | ICD-10-CM | POA: Diagnosis not present

## 2023-10-23 DIAGNOSIS — Z515 Encounter for palliative care: Secondary | ICD-10-CM | POA: Diagnosis not present

## 2023-10-23 DIAGNOSIS — I700 Atherosclerosis of aorta: Secondary | ICD-10-CM | POA: Insufficient documentation

## 2023-10-23 LAB — GLUCOSE, CAPILLARY: Glucose-Capillary: 163 mg/dL — ABNORMAL HIGH (ref 70–99)

## 2023-10-27 ENCOUNTER — Ambulatory Visit

## 2023-10-30 NOTE — Telephone Encounter (Signed)
 In review record for follow up, appears patient has passed away. Will forward to Dr. Alvis Ba to make him aware and let our device CMA team know so we can update our information and extend condolences.

## 2023-11-01 NOTE — Plan of Care (Signed)
  Problem: Clinical Measurements: Goal: Ability to maintain clinical measurements within normal limits will improve Outcome: Not Progressing Goal: Diagnostic test results will improve Outcome: Not Progressing Goal: Respiratory complications will improve Outcome: Not Progressing Goal: Cardiovascular complication will be avoided Outcome: Not Progressing   Problem: Pain Managment: Goal: General experience of comfort will improve and/or be controlled Outcome: Not Progressing

## 2023-11-01 NOTE — Progress Notes (Signed)
  Chart reviewed. Noted pt transitioned to comfort care. Hospital death expected. No TOC needs identified at this time.    10/18/2023 0947  TOC Brief Assessment  Insurance and Status Reviewed  Patient has primary care physician Yes  Home environment has been reviewed Home w/ spouse  Prior level of function: Independent/modified independent  Prior/Current Home Services No current home services  Social Drivers of Health Review SDOH reviewed no interventions necessary  Readmission risk has been reviewed Yes  Transition of care needs transition of care needs identified, TOC will continue to follow

## 2023-11-01 NOTE — Progress Notes (Signed)
 Checked in on [place] and her husband and his daughter.  They requested another prayer.  They are awaiting her daughter to come this morning.

## 2023-11-01 NOTE — Progress Notes (Signed)
 Progress Note   Patient: [name] UEA:[id number] DOB: 06-03-1940 DOA: 10/13/2023     10 DOS: the patient was seen and examined on 10/10/2023   Brief hospital course: 83 year old woman PMH including CAD, PAF, combined systolic and diastolic CHF, status post ICD, presenting with shortness of breath, chest pain, globus sensation, coughing.  Initial evaluation revealed markedly elevated troponin, CT chest showed large soft tissue mass in subcarinal region with mass effect on mid thoracic esophagus.  Admitted for further evaluation, seen by pulmonology and underwent bronchoscopy.  Biopsies revealed squamous cell carcinoma.  Seen by palliative medicine and briefly by oncology.  Patient was transferred to [facility] for initiation of urgent radiation therapy, however prior to beginning therapy the patient decompensated was transferred to the ICU.  After further discussion with family, the decision was made to transition to full comfort care.  In-hospital death anticipated.    Consultants Cardiology Pulmonology Palliative medicine  Procedures/Events 5/16 bronchoscopy showed circumferential mass bronchus intermedius  Assessment and Plan: Squamous cell lung cancer, right lung with adrenal mets Hypercalcemia of malignancy Initial CT revealed lung mass.  Patient underwent bronchoscopy, biopsy showed squamous cell carcinoma.   Seen by oncology.  Plans were made for urgent radiation therapy, however patient decompensated prior to initiation of therapy and after further conversation with the medical team, comfort care was elected.  Acute hypoxic respiratory failure Postobstructive pneumonia Predominantly secondary to lung cancer above.  Pneumonia treated with antibiotics.    AKI on CKD stage IIIb Creatinine of 1 on admission with worsening during admission and peak of 3.81. Renal ultrasound significant for no hydronephrosis.  Secondary to contrast nephropathy and ARB use. Seen by nephrology,  given clinical improvement, nephrology signed off   Demand ischemia Left bundle branch block Seen by cardiology.  No significant change in echocardiogram.  Conservative management recommended.  Paroxysmal atrial fibrillation Acquired thrombophilia Long-term anticoagulation, supratherapeutic INR on admission Treated with heparin  infusion, warfarin.  Now comfort care.   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  Dysphagia Secondary to thoracic mass.  Comfort care now.   Severe malnutrition Now comfort care.   Hyponatremia Mild.  Secondary to lung disease.   Aortic atherosclerosis Emphysema    Subjective:  Resting comfortably per husband  Physical Exam: Vitals:   10/21/2023 0000 10/07/2023 0400 10/11/2023 0500 10/26/2023 0800  BP:      Pulse: 77 85  90  Resp: 15 (!) 9  (!) 9  Temp:      TempSrc:      SpO2: 98% 98%  97%  Weight:   41.1 kg   Height:       Physical Exam Vitals reviewed.  Constitutional:      General: She is not in acute distress.    Appearance: She is not toxic-appearing.  Cardiovascular:     Rate and Rhythm: Normal rate and regular rhythm.     Heart sounds: No murmur heard. Pulmonary:     Effort: Pulmonary effort is normal. No respiratory distress.     Breath sounds: No wheezing, rhonchi or rales.  Musculoskeletal:     Right lower leg: No edema.     Left lower leg: No edema.  Neurological:     Mental Status: She is alert.     Data Reviewed: All data from this hospitalization reviewed  Family Communication: husband at bedside  Disposition: Status is: Inpatient Remains inpatient appropriate because: Comfort care  Time spent: 55 minutes  Author: Jerline Moon, MD 10/11/2023 2:39 PM  For on call review [email or web address].

## 2023-11-01 NOTE — Death Summary Note (Signed)
 DEATH SUMMARY   Patient Details  Name: [name] MRN: [id number] DOB: 28-Jun-1940 WUJ:[id number], Cris Dollar, MD Admission/Discharge Information   Admit Date:  11/07/23  Date of Death: Date of Death: 11-17-23  Time of Death: Time of Death: Nov 25, 1528  Length of Stay: 10   Principle Cause of death: Squamous cell lung cancer, right lung with adrenal mets   Hospital Diagnoses: Principal Problem:   Squamous cell carcinoma of lung (HCC) Active Problems:   Automatic implantable cardioverter-defibrillator in situ   History of mitral valve replacement with mechanical valve   Long term (current) use of anticoagulants   Paroxysmal atrial fibrillation (HCC)   MVP (mitral valve prolapse)   Anemia   Supratherapeutic INR   Demand ischemia (HCC)   Protein-calorie malnutrition, severe   Acute respiratory failure with hypoxia (HCC)   Palliative care encounter   Pneumonia due to infectious organism   Pleural effusion   DNR (do not resuscitate)   High risk medication use   End of life care   ACP (advance care planning)   Hypercalcemia   AKI (acute kidney injury) (HCC)   CKD (chronic kidney disease), stage III (HCC)   Acquired thrombophilia (HCC)   Aortic atherosclerosis (HCC)   Emphysema lung [facility])   Hospital Course: 83 year old woman PMH including CAD, PAF, combined systolic and diastolic CHF, status post ICD, presenting with shortness of breath, chest pain, globus sensation, coughing.  Initial evaluation revealed markedly elevated troponin, CT chest showed large soft tissue mass in subcarinal region with mass effect on mid thoracic esophagus.  Admitted for further evaluation, seen by pulmonology and underwent bronchoscopy.  Biopsies revealed squamous cell carcinoma.  Seen by palliative medicine and briefly by oncology.  Patient was transferred to [facility] for initiation of urgent radiation therapy, however prior to beginning therapy the patient decompensated was transferred to the  ICU.  After further discussion with family, the decision was made to transition to full comfort care.  In-hospital death anticipated.    Consultants Cardiology Pulmonology Palliative medicine  Procedures/Events 5/16 bronchoscopy showed circumferential mass bronchus intermedius  Squamous cell lung cancer, right lung with adrenal mets Hypercalcemia of malignancy Initial CT revealed lung mass.  Patient underwent bronchoscopy, biopsy showed squamous cell carcinoma.   Seen by oncology.  Plans were made for urgent radiation therapy, however patient decompensated prior to initiation of therapy and after further conversation with the medical team, comfort care was elected.   Acute hypoxic respiratory failure Postobstructive pneumonia Predominantly secondary to lung cancer above.  Pneumonia treated with antibiotics.    AKI on CKD stage IIIb Creatinine of 1 on admission with worsening during admission and peak of 3.81. Renal ultrasound significant for no hydronephrosis.  Secondary to contrast nephropathy and ARB use. Seen by nephrology, given clinical improvement, nephrology signed off   Demand ischemia Left bundle branch block Seen by cardiology.  No significant change in echocardiogram.  Conservative management recommended.   Paroxysmal atrial fibrillation Acquired thrombophilia Long-term anticoagulation, supratherapeutic INR on admission Treated with heparin  infusion, warfarin.  Now comfort care.   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   Dysphagia Secondary to thoracic mass.  Comfort care now.   Severe malnutrition Now comfort care.   Hyponatremia Mild.  Secondary to lung disease.   Aortic atherosclerosis Emphysema        The results of significant diagnostics from this hospitalization (including imaging, microbiology, ancillary and laboratory) are  listed  below for reference.   Significant Diagnostic Studies: DG Chest 1 View Result Date: 10/22/2023 CLINICAL DATA:  Shortness of breath. EXAM: CHEST  1 VIEW COMPARISON:  10/18/2023 FINDINGS: No gross change in enlargement of the cardiac silhouette. Interval large right pleural effusion and patchy density in the adjacent right lung. Clear left lung. Stable mild chronic interstitial prominence. Stable median sternotomy wires, prosthetic mitral valve and right subclavian pacer and AICD leads. Bilateral shoulder prostheses. IMPRESSION: 1. Interval large right pleural effusion and patchy density in the adjacent right lung, which could represent atelectasis or pneumonia. 2. Stable cardiomegaly. 3. Stable mild chronic interstitial prominence. Electronically Signed   By: Catherin Closs M.D.   On: 10/22/2023 17:22   CT HEAD WO CONTRAST ( ) Result Date: 10/19/2023 CLINICAL DATA:  Head trauma, coagulopathy (Age 39-64y) Head trauma on coumadin  with INR 1.7 EXAM: CT HEAD WITHOUT CONTRAST TECHNIQUE: Contiguous axial images were obtained from the base of the skull through the vertex without intravenous contrast. RADIATION DOSE REDUCTION: This exam was performed according to the departmental dose-optimization program which includes automated exposure control, adjustment of the mA and/or kV according to patient size and/or use of iterative reconstruction technique. COMPARISON:  CT head 05/29/2023. FINDINGS: Brain: No evidence of acute infarction, hemorrhage, hydrocephalus, extra-axial collection or mass lesion/mass effect. Advanced patchy white matter hypodensities, nonspecific but compatible with chronic microvascular ischemic disease. Vascular: No hyperdense vessel. Skull: Left posterior scalp hematoma.  No acute fracture. Sinuses/Orbits: Clear sinuses.  No acute orbital findings. IMPRESSION: 1. No evidence of acute intracranial abnormality. 2. Left posterior scalp hematoma. 3. Advanced chronic microvascular ischemic disease.  Electronically Signed   By: Stevenson Elbe M.D.   On: 10/19/2023 03:15   US  RENAL Result Date: 10/18/2023 CLINICAL DATA:  [id number] Acute renal failure superimposed on stage 3b chronic kidney disease (HCC) [id number] EXAM: RENAL / URINARY TRACT ULTRASOUND COMPLETE COMPARISON:  Oct 03, 2022 FINDINGS: Evaluation is limited secondary to limited acoustic windows and inability to hold breath. Right Kidney: Renal measurements: 8.2 x 3.7 x 4.9 cm = volume: 66 mL. Echogenicity is increased. No hydronephrosis visualized. Benign cyst noted measuring 6 mm (for which no dedicated imaging follow-up is recommended). Left Kidney: Renal measurements: 9.6 x 5.7 x 4.7 cm = volume: 134 mL. Echogenicity is mildly increased. No hydronephrosis visualized. Exophytic cyst measuring up to 11 mm, suboptimally assessed If persistent clinical concern for traumatic injury, recommend dedicated cross-sectional imaging. Bladder: Appears normal for degree of bladder distention. Other: RIGHT pleural effusion. IMPRESSION: 1. No hydronephrosis. 2. Increased renal echogenicity as can be seen in medical renal disease. 3. RIGHT pleural effusion. Electronically Signed   By: Clancy Crimes M.D.   On: 10/18/2023 16:56   DG CHEST PORT 1 VIEW Result Date: 10/18/2023 CLINICAL DATA: Shortness of breath EXAM: PORTABLE CHEST 1 VIEW COMPARISON:  10/16/2023 FINDINGS: The cardio pericardial silhouette is enlarged. Right base collapse/consolidation is similar to prior with small right pleural effusion. Interstitial coarsening at the left base is similar. Status post cardiac valve replacement. Left-sided permanent pacemaker evident with multiple additional pacer leads overlying the chest. Status post left shoulder replacement. Bones are diffusely demineralized. IMPRESSION: Right base collapse/consolidation with small right pleural effusion, similar to prior. Electronically Signed   By: Donnal Fusi M.D.   On: 10/18/2023 05:32   DG Chest Port 1 View Result  Date: 10/16/2023 CLINICAL DATA:  Status post bronchoscopy. EXAM: PORTABLE CHEST 1 VIEW COMPARISON:  Chest radiograph dated 10/13/2023 and CT dated 10/13/2023. FINDINGS: Bilobed of  emphysema. Right lower lobe opacity better evaluated on CT. No pneumothorax. Mild cardiomegaly. Median sternotomy wires and left AICD device. No acute osseous pathology. Bilateral shoulder arthroplasties. IMPRESSION: 1. No pneumothorax. 2. Right lower lobe opacity better evaluated on CT. Electronically Signed   By: Angus Bark M.D.   On: 10/16/2023 12:02   ECHOCARDIOGRAM COMPLETE Result Date: 10/14/2023    ECHOCARDIOGRAM REPORT   Patient Name:   ADILYNN BESSEY [facility] Date of Exam: 10/14/2023 Medical Rec #:  [id number]           Height:       62.0 in Accession #:    [id number]          Weight:       92.6 lb Date of Birth:  Oct 04, 1940            BSA:          1.378 m Patient Age:    83 years            BP:           135/66 mmHg Patient Gender: F                   HR:           90 bpm. Exam Location:  Inpatient Procedure: 2D Echo (Both Spectral and Color Flow Doppler were utilized during            procedure). Indications:    Chest Pain R07.9  History:        Patient has prior history of Echocardiogram examinations, most                 recent 02/20/2023. CHF, COPD, Mitral Valve Disease; Risk                 Factors:Hypertension.                  Mitral Valve: 25 mm St. [name] valve is present in the mitral                 position. Procedure Date: 02/26/2006.  Sonographer:    Astrid Blamer Referring Phys: [id number] MARISSA C KRUGH IMPRESSIONS  1. Left ventricular ejection fraction, by estimation, is 25%. The left ventricle has severely decreased function. The left ventricle demonstrates global hypokinesis. The left ventricular internal cavity size was moderately dilated. Left ventricular diastolic parameters are indeterminate.  2. Right ventricular systolic function is mildly reduced. The right ventricular size is normal. Tricuspid regurgitation  signal is inadequate for assessing PA pressure.  3. The mitral valve has been repaired/replaced. Trivial mitral valve regurgitation likely normal washing jet. The mean mitral valve gradient is 4.0 mmHg with average heart rate of 86 bpm. There is a 25 mm St. [name] present in the mitral position. Procedure Date: 02/26/2006. Echo findings are consistent with normal structure and function of the mitral valve prosthesis.  4. The aortic valve is tricuspid. There is mild calcification of the aortic valve. Aortic valve regurgitation is mild. No aortic stenosis is present.  5. The inferior vena cava is dilated in size with <50% respiratory variability, suggesting right atrial pressure of 15 mmHg. FINDINGS  Left Ventricle: Left ventricular ejection fraction, by estimation, is 25%. The left ventricle has severely decreased function. The left ventricle demonstrates global hypokinesis. The left ventricular internal cavity size was moderately dilated. There is  no left ventricular hypertrophy. Left ventricular diastolic parameters are indeterminate. Right Ventricle: The right ventricular size is normal.  No increase in right ventricular wall thickness. Right ventricular systolic function is mildly reduced. Tricuspid regurgitation signal is inadequate for assessing PA pressure. Left Atrium: Left atrial size was normal in size. Right Atrium: Right atrial size was normal in size. Pericardium: There is no evidence of pericardial effusion. Mitral Valve: The mitral valve has been repaired/replaced. Trivial mitral valve regurgitation. There is a 25 mm St. [name] present in the mitral position. Procedure Date: 02/26/2006. Echo findings are consistent with normal structure and function of the mitral valve prosthesis. MV peak gradient, 6.8 mmHg. The mean mitral valve gradient is 4.0 mmHg with average heart rate of 86 bpm. Tricuspid Valve: The tricuspid valve is normal in structure. Tricuspid valve regurgitation is trivial. No evidence of  tricuspid stenosis. Aortic Valve: The aortic valve is tricuspid. There is mild calcification of the aortic valve. Aortic valve regurgitation is mild. Aortic regurgitation PHT measures 562 msec. No aortic stenosis is present. Aortic valve mean gradient measures 4.0 mmHg. Aortic valve peak gradient measures 6.5 mmHg. Aortic valve area, by VTI measures 2.17 cm. Pulmonic Valve: The pulmonic valve was grossly normal. Pulmonic valve regurgitation is trivial. No evidence of pulmonic stenosis. Aorta: The aortic root is normal in size and structure. Venous: The inferior vena cava is dilated in size with less than 50% respiratory variability, suggesting right atrial pressure of 15 mmHg. IAS/Shunts: No atrial level shunt detected by color flow Doppler.  LEFT VENTRICLE PLAX 2D LVIDd:         4.80 cm LVIDs:         4.00 cm LV PW:         0.90 cm LV IVS:        0.90 cm LVOT diam:     1.90 cm LV SV:         51 LV SV Index:   37 LVOT Area:     2.84 cm  RIGHT VENTRICLE TAPSE (M-mode): 1.0 cm LEFT ATRIUM             Index        RIGHT ATRIUM          Index LA Vol (A2C):   34.4 ml 24.96 ml/m  RA Area:     5.91 cm LA Vol (A4C):   38.5 ml 27.94 ml/m  RA Volume:   8.12 ml  5.89 ml/m LA Biplane Vol: 37.0 ml 26.85 ml/m  AORTIC VALVE AV Area (Vmax):    2.37 cm AV Area (Vmean):   1.95 cm AV Area (VTI):     2.17 cm AV Vmax:           127.00 cm/s AV Vmean:          103.000 cm/s AV VTI:            0.234 m AV Peak Grad:      6.5 mmHg AV Mean Grad:      4.0 mmHg LVOT Vmax:         106.00 cm/s LVOT Vmean:        70.700 cm/s LVOT VTI:          0.179 m LVOT/AV VTI ratio: 0.76 AI PHT:            562 msec  AORTA Ao Root diam: 3.10 cm MITRAL VALVE MV Area (PHT): 3.65 cm     SHUNTS MV Area VTI:   1.84 cm     Systemic VTI:  0.18 m MV Peak grad:  6.8 mmHg  Systemic Diam: 1.90 cm MV Mean grad:  4.0 mmHg MV Vmax:       1.31 m/s MV Vmean:      96.5 cm/s MV Decel Time: 208 msec MV E velocity: 126.00 cm/s MV A velocity: 128.00 cm/s MV E/A ratio:   0.98 Grady Lawman MD Electronically signed by Grady Lawman MD Signature Date/Time: 10/14/2023/1:19:06 PM    Final    CT Soft Tissue Neck W Contrast Result Date: 10/13/2023 CLINICAL DATA:  Stridor and chest pain EXAM: CT NECK WITH CONTRAST TECHNIQUE: Multidetector CT imaging of the neck was performed using the standard protocol following the bolus administration of intravenous contrast. RADIATION DOSE REDUCTION: This exam was performed according to the departmental dose-optimization program which includes automated exposure control, adjustment of the mA and/or kV according to patient size and/or use of iterative reconstruction technique. CONTRAST:  75mL OMNIPAQUE  IOHEXOL  350 MG/ML SOLN COMPARISON:  None Available. FINDINGS: Pharynx and larynx: Normal. No mass or swelling. Salivary glands: No inflammation, mass, or stone. Thyroid: Normal Lymph nodes: None enlarged or abnormal density. Vascular: Numerous small caliber collaterals surrounding the spinal column. Major vessels are patent. Calcific aortic atherosclerosis. Limited intracranial: Normal Visualized orbits: Normal Mastoids and visualized paranasal sinuses: Clear Skeleton: Negative Upper chest: Biapical emphysema. Other: Patulous debris containing lower cervical and upper thoracic esophagus. IMPRESSION: 1. Patulous debris containing lower cervical and upper thoracic esophagus. 2. No acute abnormality of the neck. Aortic Atherosclerosis (ICD10-I70.0) and Emphysema (ICD10-J43.9). Electronically Signed   By: Juanetta Nordmann M.D.   On: 10/13/2023 19:45   CT Angio Chest PE W and/or Wo Contrast Result Date: 10/13/2023 CLINICAL DATA:  Chest discomfort, difficulty breathing, stridor, throat tightness EXAM: CT ANGIOGRAPHY CHEST WITH CONTRAST TECHNIQUE: Multidetector CT imaging of the chest was performed using the standard protocol during bolus administration of intravenous contrast. Multiplanar CT image reconstructions and MIPs were obtained to evaluate the  vascular anatomy. RADIATION DOSE REDUCTION: This exam was performed according to the departmental dose-optimization program which includes automated exposure control, adjustment of the mA and/or kV according to patient size and/or use of iterative reconstruction technique. CONTRAST:  75mL OMNIPAQUE  IOHEXOL  350 MG/ML SOLN COMPARISON:  10/13/2023, 06/22/2022 FINDINGS: Cardiovascular: This is a technically adequate evaluation of the pulmonary vasculature. No filling defects or pulmonary emboli. The heart is enlarged, with marked left ventricular dilatation. Multi lead cardiac pacer again identified. No evidence of thoracic aortic aneurysm or dissection. Atherosclerosis throughout the aorta and coronary vasculature. 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 Mediastinum/Nodes: Subcarinal soft tissue mass measures up to 3.2 x 5.6 cm, reference image 63/4. This has mass effect upon the midthoracic esophagus, which is deviated to the left. This results in proximal esophageal dilatation and retained secretions. The trachea appears unremarkable. Thyroid is not well visualized. Lungs/Pleura: There is complete consolidation with volume loss in the right lower lobe. There is opacification of the bronchus intermedius and right lower lobe bronchus. Central obstructing neoplasm cannot be excluded. Upper lobe predominant emphysema. Trace right pleural effusion. No pneumothorax. Upper Abdomen: Bilateral adrenal nodules, measuring 14 mm on the right and 10 mm on the left. No other acute upper abdominal findings. Musculoskeletal: No acute or destructive bony abnormalities. Bilateral shoulder arthroplasties. Reconstructed images demonstrate no additional findings. Review of the MIP images confirms the above findings. IMPRESSION: 1. Large soft tissue mass in the subcarinal region, with mass effect  upon the midthoracic esophagus.  Findings compatible with malignancy or adenopathy. 2. Dense consolidation of the right lower lobe with volume loss, as well as opacification of the bronchus intermedius and right lower lobe bronchial tree. Central obstructing neoplasm is suspected. Bronchoscopy may be useful. 3. Trace right pleural effusion. 4. No evidence of pulmonary embolus. 5. Nonspecific bilateral adrenal nodularity, metastatic disease not excluded in light of intrathoracic findings. 6. Cardiomegaly with prominent left ventricular dilatation. 7. Aortic Atherosclerosis (ICD10-I70.0) and Emphysema (ICD10-J43.9). Electronically Signed   By: Bobbye Burrow M.D.   On: 10/13/2023 18:51   DG Chest 2 View Result Date: 10/13/2023 CLINICAL DATA:  Shortness of breath.  Wheezing. EXAM: CHEST - 2 VIEW COMPARISON:  Chest radiographs 10/03/2022, 06/20/2022, 11/18/2021 FINDINGS: Status post median sternotomy and mitral valve prosthesis, unchanged. Mild high-grade atherosclerotic calcifications within the aortic arch. Left chest wall AICD with leads overlying the right atrium, right antrum ventricle, and coronary sinus. Cardiac silhouette is again at the upper limits of normal size. Mediastinal contours are within normal limits. Mild bilateral interstitial thickening is actually improved from 10/03/2022 and 06/20/2022 and may represent the patient's normal baseline, mild chronic fibrosis. Small to moderate right pleural effusion and associated right basilar heterogeneous airspace opacification appears to be new from prior. No pneumothorax. Status post reverse total left shoulder arthroplasty and standard total right shoulder arthroplasty. IMPRESSION: 1. Small to moderate right pleural effusion and associated right basilar heterogeneous airspace opacification appears to be new from prior. This may represent atelectasis or pneumonia. 2. Mild bilateral interstitial thickening is actually improved from 10/03/2022 and 06/20/2022  and may represent the patient's normal baseline, mild chronic fibrosis. Electronically Signed   By: Bertina Broccoli M.D.   On: 10/13/2023 16:32    Microbiology: Recent Results (from the past 240 hours)  Blood culture (routine x 2)     Status: None   Collection Time: 10/13/23  5:34 PM   Specimen: BLOOD LEFT ARM  Result Value Ref Range Status   Specimen Description BLOOD LEFT ARM  Final   Special Requests   Final    BOTTLES DRAWN AEROBIC AND ANAEROBIC Blood Culture adequate volume   Culture   Final    NO GROWTH 5 DAYS Performed at [facility], [address] [place], [place] [address]    Report Status 10/18/2023 FINAL  Final  Blood culture (routine x 2)     Status: None   Collection Time: 10/13/23  8:48 PM   Specimen: BLOOD RIGHT FOREARM  Result Value Ref Range Status   Specimen Description BLOOD RIGHT FOREARM  Final   Special Requests   Final    BOTTLES DRAWN AEROBIC AND ANAEROBIC Blood Culture results may not be optimal due to an inadequate volume of blood received in culture bottles   Culture   Final    NO GROWTH 5 DAYS Performed at [facility], [address] [place], [place] [address]    Report Status 10/18/2023 FINAL  Final  MRSA Next Gen by PCR, Nasal     Status: None   Collection Time: 10/14/23  2:06 AM   Specimen: Nasal Mucosa; Nasal Swab  Result Value Ref Range Status   MRSA by PCR Next Gen NOT DETECTED NOT DETECTED Final    Comment: (NOTE) The GeneXpert MRSA Assay (FDA approved for NASAL specimens only), is one component of a comprehensive MRSA colonization surveillance program. It is not intended to diagnose MRSA infection nor to guide or monitor treatment for MRSA infections. Test performance is not FDA  approved in patients less than 74 years old. Performed at [facility], [address], [place], [place] [address]   Culture, BAL-quantitative w Gram Stain     Status: None   Collection Time: 10/16/23 10:21 AM   Specimen: Bronchial Alveolar  Lavage; Respiratory  Result Value Ref Range Status   Specimen Description BRONCHIAL ALVEOLAR LAVAGE  Final   Special Requests BAL bronchus intermedius  Final   Gram Stain   Final    RARE WBC PRESENT, PREDOMINANTLY PMN NO ORGANISMS SEEN    Culture   Final    NO GROWTH 2 DAYS Performed at [facility], [address], [place], [place] [address]    Report Status 10/18/2023 FINAL  Final  MRSA Next Gen by PCR, Nasal     Status: None   Collection Time: 10/22/23  4:17 PM   Specimen: Nasal Mucosa; Nasal Swab  Result Value Ref Range Status   MRSA by PCR Next Gen NOT DETECTED NOT DETECTED Final    Comment: (NOTE) The GeneXpert MRSA Assay (FDA approved for NASAL specimens only), is one component of a comprehensive MRSA colonization surveillance program. It is not intended to diagnose MRSA infection nor to guide or monitor treatment for MRSA infections. Test performance is not FDA approved in patients less than 40 years old. Performed at [facility], [address] [place], [place] [address]     Time spent: 55 minutes  Signed: Jerline Moon, MD 10/19/2023

## 2023-11-01 NOTE — Progress Notes (Signed)
 Called to room by family,pt noted without breath and pulse. Bobbie jordan-thomas -RN and myself perform assessment and found no breath or pulse. Time of death noted at 1530. Dr. Jerilynn Montenegro notified at [phone]. Ac notified at [employer].Family present in room.[place] called at 1600. Spoke with Alban Alm Ref# [id number].

## 2023-11-01 NOTE — Progress Notes (Signed)
 CHART NOTE The patient was seen briefly last night.  Her husband [name] who is a previous patient of mine as well as her daughter were at the bedside.  The patient came back from the first treatment of radiation and she was in significant respiratory failure.  The family after discussion with the care team have decided to consider just comfort care at this point.  I provided my support to the family and they know how to reach me if there is any change in her status or any help I can provide.

## 2023-11-01 NOTE — Progress Notes (Signed)
 Daily Progress Note   Patient Name: [name]       Date: 10/10/2023 DOB: Aug 08, 1940  Age: 83 y.o. MRN#: [id number] Attending Physician: Lonita Roach, MD Primary Care Physician: Bertha Broad, MD Admit Date: 10/13/2023 Length of Stay: 10 days  Reason for Consultation/Follow-up: Establishing goals of care  Subjective:   Reviewed EMR.  Patient continues to receive continuous IV Dilaudid  infusion for management of pain and shortness of breath at end-of-life.  Currently IV Dilaudid  infusion at 0.5 mg/h.  Time of EMR reviewed past 24 hours patient has received as needed IV glycopyrrolate  x 1 dose and as needed IV Dilaudid  0.5 mg bolus x 1 dose. Patient unresponsive and appears comfortable.  Family at bedside appropriately grieving.  This includes patient's husband and 2 daughters.  Chaplain was able to visit today to provide emotional support. Discussed care with RN for updates and to coordinate care.  Patient will be transferred to floor room to continue comfort care.  Anticipate hospital death.  Objective:   Vital Signs:  BP (!) 139/56 (BP Location: Left Arm)   Pulse 85   Temp 97.6 F (36.4 C) (Axillary)   Resp (!) 9   Ht 5\' 2"  (1.575 m)   Wt 41.1 kg   SpO2 98%   BMI 16.57 kg/m   Physical Exam: General: Unresponsive, cachectic, frail Cardiovascular: RRR Respiratory: No increased work of breath noted Neuro: Unresponsive  Imaging:  I personally reviewed recent imaging.   Assessment & Plan:   Assessment: [name] is a 83 yr old woman who presented to the ED on 10/13/2023 with complaints of severe coughing and occasional hemoptysis going on for several months. 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 Palliative care has been asked to support additional goals of care conversations in the setting of mediastinal mass.   Recommendations/Plan: # Complex medical  decision making/goals of care:  # Complex medical decision making/goals of care  -Patient unable to participate in medical decision making secondary to mental status.  - Patient was transition to full comfort focused care on 10/22/2023 after discussion with patient's husband.  Continuing comfort focused care at this time.  Anticipate in-hospital death.  - Have discontinued interventions that are no longer focused on comfort such as IV fluids, imaging, or lab work.  Focusing on symptom management of pain, dyspnea, and agitation in the setting of end-of-life care.    Code Status: Do not attempt resuscitation (DNR) - Comfort care  # Symptom management Patient is receiving these palliative interventions for symptom management with an intent to improve quality of life.     -Pain/Dyspnea, acute in the setting of end-of-life care                Patient was not on medications for pain previously.                               - Continue IV hydromorphone  continuous infusion due to severe work of breathing with boluses as needed.  Continue to titrate based on patient's symptom burden.                  -Anxiety/agitation, in the setting of end-of-life care                               -  Continue IV Ativan 1 mg every 4 hours as needed. Continue to adjust based on patient's symptom burden.                                 - Continue IV Haldol 1 mg every 4 hours as needed. Continue to adjust based on patient's symptom burden.                   -Secretions, in the setting of end-of-life care                               - Continue IV glycopyrrolate  0.2 mg every 4 hours as needed.  # Psychosocial Support:  - Husband, daughters  # Discharge Planning: Anticipated Hospital Death  Thank you for allowing the palliative care team to participate in the care [facility].  Barnett Libel, DO Palliative Care Provider PMT # [phone]  If patient remains symptomatic despite maximum doses, please call PMT  at [phone] between 0700 and 1900. Outside of these hours, please call attending, as PMT does not have night coverage.

## 2023-11-01 DEATH — deceased

## 2023-11-02 NOTE — Telephone Encounter (Signed)
 Thanks. I spoke to her husband [name] and offered our condolences.

## 2023-11-03 ENCOUNTER — Ambulatory Visit

## 2023-11-05 ENCOUNTER — Encounter

## 2023-11-06 ENCOUNTER — Encounter

## 2023-12-01 NOTE — Progress Notes (Signed)
  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      Indication for treatment:  palliative       Radiation treatment dates:   n/a  Site/planned dose:   The patient was evaluated to consider palliative radiation to the mediastinum to treat her bulky nodal disease. She proceeded with simulation and was scheduled to proceed with 30 Gy in 10 fractions, however she clinically declined and was transitioned to comfort care on 11/16/2023 and passed on 17-Nov-2023.   Plan:  Our team will be available to answer questions her family or medical team have moving forward.     Shelvia Dick, PAC
